# Patient Record
Sex: Male | Born: 1975
Health system: Southern US, Community
[De-identification: ages and names within clinical notes are randomized; demographics above are authoritative.]

## PROBLEM LIST (undated history)

## (undated) DIAGNOSIS — L8990 Pressure ulcer of unspecified site, unspecified stage: Secondary | ICD-10-CM

## (undated) DIAGNOSIS — Q8502 Neurofibromatosis, type 2: Secondary | ICD-10-CM

## (undated) DIAGNOSIS — G8250 Quadriplegia, unspecified: Secondary | ICD-10-CM

## (undated) DIAGNOSIS — R569 Unspecified convulsions: Secondary | ICD-10-CM

## (undated) DIAGNOSIS — D333 Benign neoplasm of cranial nerves: Secondary | ICD-10-CM

## (undated) DIAGNOSIS — H9190 Unspecified hearing loss, unspecified ear: Secondary | ICD-10-CM

## (undated) HISTORY — PX: LUMBAR FUSION: SHX111

## (undated) HISTORY — PX: OTHER SURGICAL HISTORY: SHX169

## (undated) HISTORY — PX: HAND EXPLORATION: SHX1725

## (undated) HISTORY — PX: NEPHRECTOMY: SHX65

## (undated) HISTORY — PX: SPINAL FUSION: SHX223

---

## 1998-05-15 ENCOUNTER — Ambulatory Visit (HOSPITAL_COMMUNITY): Admission: RE | Admit: 1998-05-15 | Discharge: 1998-05-15 | Payer: Self-pay

## 2002-02-04 ENCOUNTER — Ambulatory Visit (HOSPITAL_COMMUNITY): Admission: RE | Admit: 2002-02-04 | Discharge: 2002-02-04 | Payer: Self-pay | Admitting: Neurology

## 2002-02-04 ENCOUNTER — Encounter: Payer: Self-pay | Admitting: Neurology

## 2002-08-12 ENCOUNTER — Ambulatory Visit (HOSPITAL_BASED_OUTPATIENT_CLINIC_OR_DEPARTMENT_OTHER): Admission: RE | Admit: 2002-08-12 | Discharge: 2002-08-12 | Payer: Self-pay | Admitting: Orthopedic Surgery

## 2002-11-13 ENCOUNTER — Ambulatory Visit (HOSPITAL_COMMUNITY): Admission: RE | Admit: 2002-11-13 | Discharge: 2002-11-13 | Payer: Self-pay | Admitting: Neurology

## 2002-11-13 ENCOUNTER — Encounter: Payer: Self-pay | Admitting: Neurology

## 2003-04-11 ENCOUNTER — Encounter: Admission: RE | Admit: 2003-04-11 | Discharge: 2003-04-11 | Payer: Self-pay | Admitting: Family Medicine

## 2003-04-11 ENCOUNTER — Encounter: Payer: Self-pay | Admitting: Family Medicine

## 2004-01-05 ENCOUNTER — Ambulatory Visit (HOSPITAL_COMMUNITY): Admission: RE | Admit: 2004-01-05 | Discharge: 2004-01-05 | Payer: Self-pay | Admitting: Neurology

## 2004-10-04 ENCOUNTER — Ambulatory Visit (HOSPITAL_COMMUNITY): Admission: RE | Admit: 2004-10-04 | Discharge: 2004-10-04 | Payer: Self-pay | Admitting: Neurology

## 2005-12-13 ENCOUNTER — Ambulatory Visit (HOSPITAL_COMMUNITY): Admission: RE | Admit: 2005-12-13 | Discharge: 2005-12-13 | Payer: Self-pay | Admitting: Neurology

## 2005-12-26 ENCOUNTER — Ambulatory Visit (HOSPITAL_COMMUNITY): Admission: RE | Admit: 2005-12-26 | Discharge: 2005-12-26 | Payer: Self-pay | Admitting: Neurological Surgery

## 2006-01-02 ENCOUNTER — Ambulatory Visit (HOSPITAL_COMMUNITY): Admission: RE | Admit: 2006-01-02 | Discharge: 2006-01-02 | Payer: Self-pay | Admitting: Neurological Surgery

## 2006-05-13 ENCOUNTER — Encounter (INDEPENDENT_AMBULATORY_CARE_PROVIDER_SITE_OTHER): Payer: Self-pay | Admitting: Specialist

## 2006-05-13 ENCOUNTER — Inpatient Hospital Stay (HOSPITAL_COMMUNITY): Admission: RE | Admit: 2006-05-13 | Discharge: 2006-05-16 | Payer: Self-pay | Admitting: Neurological Surgery

## 2006-05-16 ENCOUNTER — Inpatient Hospital Stay (HOSPITAL_COMMUNITY)
Admission: RE | Admit: 2006-05-16 | Discharge: 2006-05-27 | Payer: Self-pay | Admitting: Physical Medicine & Rehabilitation

## 2006-05-16 ENCOUNTER — Ambulatory Visit: Payer: Self-pay | Admitting: Physical Medicine & Rehabilitation

## 2006-06-02 ENCOUNTER — Ambulatory Visit: Admission: RE | Admit: 2006-06-02 | Discharge: 2006-07-09 | Payer: Self-pay | Admitting: Radiation Oncology

## 2006-06-17 ENCOUNTER — Encounter
Admission: RE | Admit: 2006-06-17 | Discharge: 2006-09-15 | Payer: Self-pay | Admitting: Physical Medicine & Rehabilitation

## 2006-07-30 ENCOUNTER — Ambulatory Visit (HOSPITAL_COMMUNITY): Admission: RE | Admit: 2006-07-30 | Discharge: 2006-07-30 | Payer: Self-pay | Admitting: Neurological Surgery

## 2007-01-20 ENCOUNTER — Ambulatory Visit (HOSPITAL_COMMUNITY): Admission: RE | Admit: 2007-01-20 | Discharge: 2007-01-20 | Payer: Self-pay | Admitting: Neurological Surgery

## 2007-08-10 ENCOUNTER — Ambulatory Visit (HOSPITAL_COMMUNITY): Admission: RE | Admit: 2007-08-10 | Discharge: 2007-08-10 | Payer: Self-pay | Admitting: Neurological Surgery

## 2008-09-14 ENCOUNTER — Ambulatory Visit (HOSPITAL_COMMUNITY): Admission: RE | Admit: 2008-09-14 | Discharge: 2008-09-14 | Payer: Self-pay | Admitting: Neurological Surgery

## 2008-10-03 ENCOUNTER — Ambulatory Visit (HOSPITAL_COMMUNITY): Admission: RE | Admit: 2008-10-03 | Discharge: 2008-10-03 | Payer: Self-pay | Admitting: Neurological Surgery

## 2010-12-08 ENCOUNTER — Encounter: Payer: Self-pay | Admitting: Neurology

## 2010-12-09 ENCOUNTER — Encounter: Payer: Self-pay | Admitting: Hematology and Oncology

## 2010-12-09 ENCOUNTER — Encounter: Payer: Self-pay | Admitting: Neurological Surgery

## 2011-04-05 NOTE — Op Note (Signed)
Cameron Hale, Cameron Hale NO.:  [id number]   MEDICAL RECORD NO.:  [id number]          PATIENT TYPE:  IPS   LOCATION:  4009                         FACILITY:  [facility]   PHYSICIAN:  Stefani Dama, M.D.  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   PROCEDURE:  T4-T8 laminectomy, inclusive, myelotomies with syringotomy and  partial resection of spinal cord tumor T4 and T7, use of operative  microscope and microdissection technique.  Intraoperative ultrasonography.   SURGEON:  Stefani Dama, M.D.   FIRST ASSISTANT:  Cristi Loron, M.D.   ANESTHESIA:  General endotracheal.   INDICATIONS:  Cameron Hale is a 35 year old individual who has had  significant back and lower extremity pain and weakness.  He has been  followed by me for at least a period of a year and has been followed by a  neurologist for a period longer than that.  He was found to have multiple  tumors in the spinal cord, mainly behind the cervical body of C2, C6, T4,  T7, and T10.  He has been advised regarding the need for surgical resection  as there has been a noted deterioration in his level of function, and there  is enlargement of the tumor with an enlarging syrinx, particularly at the  level of T4 but also at the level of C7.  His largest tumor is at the T7  level, and it has been decided to proceed with resection, possibly biopsy  and syringotomy at the two levels involved.   DESCRIPTION OF PROCEDURE:  The patient was brought to the operating room  supine on the stretcher after the smooth induction of general endotracheal  anesthesia.  He was turned prone.  The back was prepped with DuraPrep and  draped in  a sterile fashion.  The bony prominences were appropriately padded  and protected. Localizing radiograph identified the external markers at the  level of T4, T8 and T10.  With these external landmarks, a midline incision  was created from T4 to T8 and the dissection was carried down through the  thoracodorsal fascia.  The subperiosteal dissection of the laminar arches  from T4 to T8 was then undertaken.  These were packed off laterally and then  laminectomy was performed removing the spinous processes and laminar arches  of T4-T8, inclusive.  The bases of the lamina were then drilled down with  high-speed bur and a 5 mm dissecting bit.  Dissection was then taken out  laterally to the lateral aspect of the common dural tube, the medial aspects  of the facet joints.  Hemostasis in the lateral gutters was obtained with  some small pledgets of Gelfoam soaked in thrombin, which were packed off  into the lateral areas to maintain hemostasis.  Once the hemostasis was  secure, and the  opening was felt to be adequate, the dissection was  continued.  The microscope was draped and brought into the field.  A dural  opening was then performed using a single tack up of 6-0 Prolene, and a 15  blade was used to open the dura.  The Lorette Ang was then inserted between the  dura and the extra arachnoid space, and this was opened as a singular layer.  The arachnoid was then opened and dissected free and clamped to the lateral  aspect of the dural opening using small Hemoclips.  The spinal fluid was  released, and there was noted to be a significant mass at the level of T8,  expanding the cord.  There was also noted to be a secondary mass at the  level of T4.  Then using ultrasonography, the solid portions of the tumor  were identified, and the borders between the tumor and the syrinx were  identified at each of the T4 and the T7 levels.  Then, using the operating  microscope and microdissection technique, the pia  above the center of the  cord was cauterized over the midline, and an area was chosen at the junction  of the syrinx and the solid tumor to create the myelotomy.  The myelotomy  released a thick yellowish fluid at each level and then the myelotomy was  then increased in size so as to allow exploration of the mass within the  cord at the syrinx.  The tumor was noted to be a tannish-gray discolored  lesion. that did not appear to be particularly hemorrhagic at the T7 level.  Biopsies of this lesion were then obtained using a micro biopsy forceps and  releasing some small pieces of tissue from within the tumor.  These biopsies  were sent for frozen section.  Further exploration identified a faintly  distinct border between the surrounding spinal cord and the tumor itself.  This was gradually developed using microdissection technique by very  carefully releasing the tumor from the border with the spinal cord itself.  A membrane was identified in the region of the syrinx, and this membrane was  also taken up and sent for biopsy.  As the dissection proceeded, a frozen  section diagnosis of the T7 lesion was returned as low grade glioma  consistent with astrocytoma.  The procedure then was continued exploring the  region of the T4 tumor.  Similar myelotomy was created here and again a  thick yellowish fluid was encountered in the syrinx, and this was released.  The tumor here was noted to have a somewhat more fleshy appearance and was  also tan-gray in color, somewhat more gelatinous and on biopsy of this tumor  it was noted to be somewhat more hemorrhagic.  Biopsies were sent separately  from the T4 lesion and after a while the frozen section diagnosis from this  lesion suggested a higher grade glioma, possibly a grade 3 glioma from the  T4 lesion.  In this area also it was noted that the border between the  surrounding tumor and the spinal cord was less distinct.  Several biopsies and a partial  resection of the T4 lesion was then attempted, and this was  performed to the best of the ability to remove as much tumor from within the  center of the tumor mass as could be safely removed without disrupting the  surrounding malleolar tissue at the border with the spinal cord itself.  Hemostasis was obtained with some small pledgets of  Gelfoam soaked in  thrombin to later remove and cautious use of bipolar cautery was used in  this area.  During the procedure, the cool wave ate bipolar tips were used  so as to minimize any surrounding tissue damage.  The procedure was then  continued back at the T7 lesion, and more tumor was resected from this  region dissecting the plane between the spinal cord and the tumor as best  possible.  As this continued, the gross bulk of the tumor was removed until  the syrinx below the tumor mass itself was identified, and this was opened  into the same syringotomy cavity.  Once this was accomplished and hemostasis  was achieved within the cavity, the T4 lesion was again explored and it was  not felt that further resection could be performed safely in this region.  The syringotomy was noted to be quite adequate in this region.  Hemostasis  was achieved within the cavity of the spinal cord itself.  At this point, it  was decided to close the dura was 6-0 Prolene sutures being used, closing  from the midline to the cephalad and midline to the inferior aspect of the  dura.  Once this was accomplished, the microscope was removed from the  surgical field.  The surrounding soft tissues were checked for hemostasis.  The wound was irrigated copiously with antibiotic irrigating solution and  then the thoracodorsal fascia was closed first by placing #1 Vicryl sutures  in the dorsal fascia and closing the individual muscle layers above this  with number 1 and 2-0 Vicryl sutures, and 3-0 Vicryl was used in the  subcutaneous tissues.  Finally, surgical staples were used in  the skin.  Blood loss for the procedure was estimated about 250 mL.  The  patient  appeared to tolerate the procedure well im the operating room.  He was then  returned turned to the recovery room in stable condition.      Stefani Dama, M.D.  Electronically Signed     HJE/MEDQ  D:  05/17/2006  T:  05/17/2006  Job:  [id number]

## 2011-04-05 NOTE — Discharge Summary (Signed)
NAMEOLUMIDE, DOLINGER NO.:  [id number]   MEDICAL RECORD NO.:  [id number]          PATIENT TYPE:  IPS   LOCATION:  4009                         FACILITY:  [facility]   PHYSICIAN:  Ellwood Dense, M.D.   DATE OF BIRTH:  26-Sep-1976   DATE OF ADMISSION:  05/16/2006  DATE OF DISCHARGE:  05/27/2006                                 DISCHARGE SUMMARY   DISCHARGE DIAGNOSES:  1.  Thoracic T4 syrinx astrocytoma.  2.  History of neurofibromatosis, type 1.  3.  Pain management.  4.  Urinary retention, resolved.   PROCEDURES:  Thoracic T4-8 laminectomy with paraparesis, May 13, 2006.   This is a 35 year old white male, history of neurofibromatosis, type 1, for  approximately 15 years, admitted May 13, 2006, with lower extremity  weakness.  MRI of spine with lesion, thoracic T4, with a large associated  syrinx and 2nd lesion at thoracic T7-T8.  Underwent thoracic T4-T8  laminectomy, myelotomy with syringovectomy and partial resection of spinal  cord tumor, May 13, 2006, per Dr. Barnett Abu.  Placed on Decadron  protocol.  Foley catheter tube remained May 15, 2006.  Pathology report  with low-grade astrocytoma with plan for Radiology Services followup.  The  patient was admitted for a comprehensive rehab program.   PAST MEDICAL HISTORY:  See discharge diagnoses.  Occasional alcohol.  No  tobacco.   ALLERGIES:  None.   SOCIAL HISTORY:  Lives with his wife.  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  They live in [place].  He has good family assistance.  They  live in a 1-level home with a ramp.   MEDICATIONS PRIOR TO ADMISSION:  None.   REHABILITATION HOSPITAL COURSE:  The patient was admitted to Inpatient Rehab  Services with therapies initiated on a b.i.d. basis consisting of physical  therapy, occupational therapy, and rehabilitation nursing.  The following  issues were addressed during patient's rehabilitation stay.  Pertaining to  Mr. Peel' thoracic T4-8  laminectomy paraparesis pathology report of low-  grade astrocytoma, follow up per Radiology Services, Dr. Dayton Scrape, who would  again see patient as an outpatient May 23, 2006, to discuss plan of care.  He would remain on Decadron therapy with taper as per dictation of Dr. Barnett Abu of Neurosurgery.  Pain management ongoing with the use of oxycodone  and good results.  Overall, he was minimal assist for his functional  mobility, supervision for upper body activities of daily living, needing  minimal assist for squat pivot transfers.  Therapies would be ongoing as per  [employer].  He had some initial urinary retention.  This did resolve.  He had been on Flomax for a short time.  This was discontinued.  Throughout  his rehab course, his mood remained upbeat and positive.  He had excellent  family support.  He was discharged to home May 27, 2006, in stable  condition.   Latest labs showed a sodium 137, potassium 4.4, BUN 17, creatinine 0.7.  Hemoglobin 13.9, hematocrit 42.7, platelets 279,000.   DISCHARGE MEDICATIONS AT TIME OF DICTATION:  1.  Decadron with taper as per Neurosurgery.  2.  Pepcid 20 mg twice daily.  3.  Oxycodone immediate release every 4 hours as needed, pain.   DIET:  Regular.   He would follow up with Dr. Lupe Carney, Medical Management, Dr. Barnett Abu, Neurosurgery, Dr. Dayton Scrape, Radiology Services, June 02, 2006, to  dictate plan of care for low-grade astrocytoma, Dr. Ellwood Dense, Rehab  Services, as advised.      Mariam Dollar, P.A.    ______________________________  Ellwood Dense, M.D.    DA/MEDQ  D:  05/26/2006  T:  05/26/2006  Job:  [id number]   cc:   Stefani Dama, M.D.  Fax: [phone]   L. Lupe Carney, M.D.  Fax: [phone]   Maryln Gottron, M.D.  Fax: [phone]

## 2011-04-05 NOTE — Op Note (Signed)
   NAME:  Cameron Hale, Cameron Hale                     ACCOUNT NO.:  [id number]   MEDICAL RECORD NO.:  [id number]                   PATIENT TYPE:  AMB   LOCATION:  DSC                                  FACILITY:  [facility]   PHYSICIAN:  Loreta Ave, M.D.              DATE OF BIRTH:  1976-10-04   DATE OF PROCEDURE:  08/12/2002  DATE OF DISCHARGE:                                 OPERATIVE REPORT   PREOPERATIVE DIAGNOSIS:  Chondromalacia of patella, right knee, with  underlying neurofibromatosis.   POSTOPERATIVE DIAGNOSES:  1. Chondromalacia of patella, right knee, with underlying neurofibromatosis.  2. Large and plain partially torn medial plica.   PROCEDURES:  1. Right knee examination under anesthesia.  2. Arthroscopy with chondroplasty.  3. Excision of medial plica.   SURGEON:  Loreta Ave, M.D.   ANESTHESIA:  Oris Drone. Petrarca, P.A.-C.   ANESTHESIA:  General.   BLOOD LOSS:  Minimal.   TOURNIQUET:  Not employed.   SPECIMENS:  None.   CULTURES:  None.   COMPLICATIONS:  None.   DRESSINGS:  Soft compressive.   PROCEDURE:  The patient was brought to the operating room and, after  adequate anesthesia had been obtained, the right knee was examined.  Full  passive motion.  Marked quadriceps atrophy.  Good stability.  Lateral  patellofemoral tracking but without tethering.  Tourniquet and leg holder  applied.  Leg prepped and draped in the usual sterile fashion.  Three  portals created, one superolateral, one each medial and lateral  peripatellar.  Inflow catheter introduced.  The knee was extended.  Arthroscope introduced.  The knee was inspected.  Some grade 1 and 2 changes  on the patella which was debrided.  For the most part, articular cartilage  looked reasonably good.  Very flat patella with lateral tracking but did not  tether enough to warrant release.  Cruciate ligaments, medial and lateral  compartment, medial and lateral meniscus all intact.  Large fibrotic  medial  plica extending a third of the way into the patellofemoral joint.  Plica  excised in its entirety.  The tracking thoroughly assessed at the  patellofemoral joint  from all angles.  Instruments and fluid removed.  Portals and knee injected  with Marcaine.  Portals closed with 4-0 nylon.  Sterile compressive dressing  applied.  Anesthesia reversed.  Brought to the recovery room.  Tolerated  surgery well.  No complications.                                               Loreta Ave, M.D.    DFM/MEDQ  D:  08/12/2002  T:  08/13/2002  Job:  [id number]

## 2011-04-05 NOTE — Op Note (Signed)
NAMEKESLEY, GAFFEY                ACCOUNT NO.:  [id number]   MEDICAL RECORD NO.:  [id number]          PATIENT TYPE:  INP   LOCATION:  3101                         FACILITY:  [facility]   PHYSICIAN:  Stefani Dama, M.D.  DATE OF BIRTH:  12-Feb-1976   DATE OF PROCEDURE:  05/15/2006  DATE OF DISCHARGE:                                 OPERATIVE REPORT   PREOPERATIVE DIAGNOSIS:  Neurofibromatosis with spinal cord tumors and  syringomyelia T4 and T7.   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   PROCEDURE:  T4-T8 laminectomy, myelotomies with syringostomy and partial  resection of spinal cord tumors T4 and T7, use of operative microscope and  microdissection technique, intraoperative ultrasonography.   SURGEON:  Dr. Danielle Dess.   FIRST ASSISTANT:  Dr. Delma Officer.   ANESTHESIA:  General endotracheal.   INDICATIONS:  Mykah Shin Beltre is a 35 year old individual who has had  significant back and lower extremity weakness.  He has been followed for a  period of a year and a half and has multiple tumors in his spinal cord,  mainly at C2, C6, T4, T7 and T10.  He has been advised regarding the need  for surgical resection as there has been noted to be deterioration in his  level function and also enlargement of the tumor with an enlarging syrinx at  the level of T4 particularly.  His largest tumor, however, is at the T7  level and it has been decided to proceed with resection, biopsy and  syringostomy at these two levels.   PROCEDURE:  The patient was brought to the operating room supine on a  stretcher.  After smooth induction of general endotracheal anesthesia, he  was turned prone.  The back was prepped with DuraPrep and draped in a  sterile fashion.  Localizing radiographs identified external markers at the  levels of T4, T8 and T10.  With these external landmarks, a midline incision  was created from T4 to T8 and dissection was  carried down to through the  thoracodorsal fascia.  Subperiosteal dissection of the laminar arches from  T4 to T8 was then undertaken.  These were packed off laterally, and then a  laminectomy was performed removing the spinous processes and laminar arches  of T4 through T8 inclusive.  The bases of the lamina were then drilled down  with a high-speed bur and a 5-mm dissecting bit.  Dissection was taken out  laterally to the lateral aspects of the dura and near the medial aspect of  the facet joint.  Hemostasis in the lateral gutters was obtained with some  small pledgets of Gelfoam soaked in thrombin, which were packed into this  area to maintain hemostasis.  Once this was secured, dissection was  continued.  The microscope was draped and brought into the field.  The dural  opening was performed using a single tack-up of 6-0 Prolene and a 15 blade  to open up dura.  Woodson dissector was then insinuated between the dura and  the extra-arachnoid space, and this was opened as a  singular layer.  Arachnoid was then opened and dissected free.  Spinal fluid was released.  There was noted to be a significant mass at the level of T8 with the tumor  in a syrinx.  There was also secondary mass at T4.  Then using  ultrasonography, the solid portions of the tumor were identified.  External  marks were placed in the area of the solid tumor to distinguish this from  the area of the syrinx.  This was done both at the T4 and the T8 levels.  Then with the use of the operating microscope, a myelotomy was created by  cauterizing the pia over the midline at the chosen areas just above and  below each of the respective  tumors.  First it was done at the T7 level.  The myelotomy was created and  immediately thick yellowish fluid was encountered.  The walls of the  syringostomy cavity were then explored.   Dictation ended at this point.      Stefani Dama, M.D.  Electronically Signed     HJE/MEDQ  D:   05/15/2006  T:  05/15/2006  Job:  [id number]

## 2011-04-05 NOTE — Op Note (Signed)
NAMEJOHNATHIN, VANDERSCHAAF                ACCOUNT NO.:  [id number]   MEDICAL RECORD NO.:  [id number]          PATIENT TYPE:  INP   LOCATION:  3101                         FACILITY:  [facility]   PHYSICIAN:  Stefani Dama, M.D.  DATE OF BIRTH:  [birth year]   DATE OF PROCEDURE:  05/13/2006  DATE OF DISCHARGE:                                 OPERATIVE REPORT   PREOPERATIVE DIAGNOSIS:  Neurofibromatosis with spinal cord tumors and  syringomyelia.   POSTOPERATIVE DIAGNOSIS:  Neurofibromatosis with spinal cord tumors and  syringomyelia.   PROCEDURE:  T4 through T8 laminectomy, myelotomy with syring ostomy, and  partial resection of spinal cord tumors with operating microscope and  microdissection technique.   SURGEON:  Stefani Dama, M.D.   FIRST ASSISTANT:  Cristi Loron, M.D.   ANESTHESIA:  General endotracheal.   INDICATIONS:  Lakota Markgraf is a 35 year old individual who has had  significant problems with progressive weakness in his lower extremities.  He  has been followed for about a year and half with presence of spinal cord  tumors noted at multiple levels including C2, C6, T4, T7 and also at T10.  Because of his progressive degeneration, a recent MRI demonstrated that  there is been marked enlargement of the syrinx and tumor at this T7 level.  There is also some modest enlargement of the syrinx at the T4 level.   Dictation ended at this point.      Stefani Dama, M.D.  Electronically Signed     HJE/MEDQ  D:  05/15/2006  T:  05/15/2006  Job:  [id number]

## 2011-04-05 NOTE — H&P (Signed)
NAME:  Cameron, Hale NO.:  [id number]   MEDICAL RECORD NO.:  [id number]          PATIENT TYPE:  IPS   LOCATION:  4009                         FACILITY:  [facility]   PHYSICIAN:  Ellwood Dense, M.D.   DATE OF BIRTH:  June 05, 1976   DATE OF ADMISSION:  05/16/2006  DATE OF DISCHARGE:                                HISTORY & PHYSICAL   HISTORY OF PRESENT ILLNESS:  Cameron Hale is a 35 year old adult male with a  history of neurofibromatosis, type 1, diagnosed approximately 15 years ago  during high  school.  He has had no substantial specific injuries related to  the neurofibromatosis although he does have a history of left eye blindness  which he assumes is secondary to that disorder.  He also has had a left arm  tendon replacement, but those are his only treatments secondary to that  above diagnosis.  The patient was admitted May 13, 2006, with lower  extremity weakness.  An MRI scan of his brain showed a lesion at T4 with a  large associated syrinx and secondary lesions at T7 and T8.  The patient  underwent T4-T8 laminectomy and myelotomy along with syringectomy and  partial resection of a spinal cord tumor May 13, 2006, performed by [employer].  Interoperative pathology was sent.  The patient was placed on a Decadron  protocol and that was decreased to 4 mg q.6h. May 15, 2006, and now 4 mg  q.8h. as of today for three more days and then decrease to 4 mg b.i.d.  His  PCA pump was discontinued May 14, 2006.  A Foley catheter was removed May 15, 2006, and post voiding residuals are being checked.   Dr. Danielle Dess subsequently reported that a path report came back positive for  astrocytoma and Dr. Danielle Dess plans to consult radiation therapy.  The patient  was evaluated by the rehabilitation physicians and felt to be an appropriate  candidate for inpatient rehabilitation.   REVIEW OF SYSTEMS:  Positive for weakness and numbness of the bilateral legs  below left level.   PAST  MEDICAL HISTORY:  1.  Type 1 neurofibromatosis diagnosed approximately 15 years ago.  2.  History of right knee arthroscopy.  3.  Left arm tendon replacement.  4.  Blindness of the left eye questionably secondary to neurofibromatosis.   FAMILY HISTORY:  Noncontributory.   SOCIAL HISTORY:  The patient lives with his wife in [place].  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  His wife managed the house and  can assist as needed.  They have no children.  They live in a one level home  with a ramp to enter.  The patient does not use tobacco and reports  occasional beer use.   FUNCTIONAL HISTORY:  Prior to admission independent with rare use of a cane.   ALLERGIES:  No known drug allergies.   MEDICATIONS PRIOR TO ADMISSION:  Tylenol.   RECENT LABORATORY DATA:  Hemoglobin 15.2, hematocrit 45.4, platelet count  229,000, and white count 7.3.  Recent sodium was 138, potassium 4.1,  chloride 106,  CO2 25, BUN 10, creatinine 0.8.   PHYSICAL EXAMINATION:  GENERAL:  Well appearing fit adult male lying in bed in mild to no acute  discomfort.  Blood pressure 113/72 with a pulse of 73, respiratory rate 18,  and temperature 97.8.  HEENT:  Normocephalic, atraumatic.  CARDIOVASCULAR:  Regular rate and rhythm, S1 and S2 without murmurs.  ABDOMEN:  Soft, nontender, with positive bowel sounds.  LUNGS:  Clear to auscultation bilaterally.  NEUROLOGICAL:  Alert and oriented x3.  Cranial nerves 2-12 showed blindness  of the left eye.  He had normal facial strength.  SKIN:  Examination of the skin showed one cafe au lait spot on his left  flank.  EXTREMITIES:  Bilateral upper extremity exam showed 5/5 strength throughout.  Bulk and tone were normal.  Reflexes were 2+ and symmetrical.  Sensation was  intact to light touch of bilateral upper extremities.  Right lower extremity  exam showed 3-/5 strength in hip flexion and knee extension with decreased  sensation throughout the entire  right lower extremity compared to his upper  extremities.  Examination of the left lower extremity showed 4+/5 strength  with, again, decreased sensation complaining of numbness in the left lower  extremity.  The patient's surgical wound showed staples in place with no  significant drainage in his upper thoracic region.   IMPRESSION:  1.  Status post T4-T8 laminectomy with resection of spinal cord tumor and      subsequent pathology showing astrocytoma.  2.  History of neurofibromatosis type 1 with blindness of the left eye.   Presently, the patient has deficits in ADLs, transfers, and ambulation  secondary to the above noted spinal cord tumor with resection and possible  ongoing radiation therapy.   PLAN:  1.  Admit to the rehabilitation unit for daily physical therapy and      occupational therapy to advance ADLs, transfers, and ambulation.  2.  Check admission labs including CBC and CMP Monday, May 19, 2006.  3.  Continue Decadron 4 mg q.8h. x3 days then 4 mg p.o. b.i.d.  4.  DC staples from back May 19, 2006, and place Steri-Strips.  5.  Continue checking post void residuals with in and out catheterizations      if volumes greater than 300 mL.  6.  Continue Pepcid 20 mg b.i.d. for GI prophylaxis while on Decadron.  7.  Dressing changes to the upper thoracic spine daily and paint incision      with Betadine.  8.  Laxative p.r.n.  9.  Oxycodone 5 mg 1-2 tablets p.o. q.4h. p.r.n. pain.  10. Regular diet.  11. 24 hour nursing for medication administration and monitoring of vital      signs along with wound dressing changes.   PROGNOSIS:  Good.   ESTIMATED LENGTH OF STAY:  8-15 days.   GOALS:  Modified independent at ADLs, transfers, and short distance  ambulation either at modified independent level or standby assist level  using a device.           ______________________________  Ellwood Dense, M.D.    DC/MEDQ  D:  05/16/2006  T:  05/16/2006  Job:  [id number]   cc:    Stefani Dama, M.D.  Fax: [phone]   L. Lupe Carney, M.D.  Fax: [phone]

## 2011-04-05 NOTE — H&P (Signed)
NAMESHALEV, HELMINIAK                ACCOUNT NO.:  [id number]   MEDICAL RECORD NO.:  [id number]          PATIENT TYPE:  INP   LOCATION:  3040                         FACILITY:  [facility]   PHYSICIAN:  Stefani Dama, M.D.  DATE OF BIRTH:  03/10/1976   DATE OF ADMISSION:  05/13/2006  DATE OF DISCHARGE:  05/16/2006                                HISTORY & PHYSICAL   ADMITTING DIAGNOSES:  1.  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  2.  Paraparesis of lower extremities.   DISCHARGE AND FINAL DIAGNOSES:  1.  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  2.  Paraparesis of lower extremities.  3.  Postoperative urinary retention.   MAJOR OPERATION:  T4-T8 laminectomy with myelotomies and syringotomy and  partial resection of spinal cord tumors T4 and T7, use of operative  microscope microdissection technique on May 13, 2006.   CONSULTATION:  Rehabilitation medicine and radiation oncology.   HOSPITAL COURSE:  Cameron Hale is a 35 year old individual who has had  difficulty with progressive weakness of his lower extremities.  He has been  followed for about a year's period of time and was found to have enlarging  tumors at the level of T4 and T7.  He also had syringomyelia.  He has other  lesions in his spinal cord noted also at C2, C7 and T10.  He was admitted to  the hospital at this time and underwent surgical resection of the largest  lesions at T4 and T7.  The frozen section diagnosis of the T4 lesion  revealed a high-grade glioma.  It appears that he has a grade 2 astrocytoma  at the T7 lesion.  He has had significant deficits preoperatively.  His  deficits have been fairly stable save for some increased weakness in that  right quadriceps and some increased numbness in his buttocks and lower  extremities.  He is now being transferred to the rehabilitation center to  undergo further inpatient rehabilitation.  Meanwhile, his tumor biopsies  have been sent to [facility] for confirmatory diagnosis by Dr.  Amparo Bristol.  He will be seen in consultation by the radiation oncology  group at [facility] and further plans for treatment will be made after he is  allowed an adequate period of time to heal from the surgical intervention.   CONDITION ON DISCHARGE:  Stable.   The possibility that he may have some further urinary retention symptoms  will be addressed by urology consult if necessary while the patient is on  the rehabilitation ward.      Stefani Dama, M.D.  Electronically Signed     HJE/MEDQ  D:  05/17/2006  T:  05/17/2006  Job:  [id number]

## 2011-09-23 DIAGNOSIS — Q8502 Neurofibromatosis, type 2: Secondary | ICD-10-CM | POA: Insufficient documentation

## 2011-09-23 DIAGNOSIS — Q8500 Neurofibromatosis, unspecified: Secondary | ICD-10-CM | POA: Insufficient documentation

## 2012-06-16 ENCOUNTER — Ambulatory Visit
Admission: RE | Admit: 2012-06-16 | Discharge: 2012-06-16 | Disposition: A | Discharge status: Home / Self Care | Payer: Medicare Other | Source: Ambulatory Visit | Attending: Family Medicine | Admitting: Family Medicine

## 2012-06-16 ENCOUNTER — Other Ambulatory Visit: Payer: Self-pay | Admitting: Family Medicine

## 2012-06-16 DIAGNOSIS — W19XXXA Unspecified fall, initial encounter: Secondary | ICD-10-CM

## 2012-07-03 DIAGNOSIS — N2889 Other specified disorders of kidney and ureter: Secondary | ICD-10-CM | POA: Insufficient documentation

## 2014-02-17 DIAGNOSIS — G8220 Paraplegia, unspecified: Secondary | ICD-10-CM | POA: Insufficient documentation

## 2014-02-17 DIAGNOSIS — Z981 Arthrodesis status: Secondary | ICD-10-CM | POA: Insufficient documentation

## 2015-03-14 DIAGNOSIS — C720 Malignant neoplasm of spinal cord: Secondary | ICD-10-CM | POA: Insufficient documentation

## 2015-03-14 DIAGNOSIS — D333 Benign neoplasm of cranial nerves: Secondary | ICD-10-CM | POA: Insufficient documentation

## 2015-03-14 DIAGNOSIS — D420 Neoplasm of uncertain behavior of cerebral meninges: Secondary | ICD-10-CM | POA: Insufficient documentation

## 2016-05-01 ENCOUNTER — Ambulatory Visit (INDEPENDENT_AMBULATORY_CARE_PROVIDER_SITE_OTHER): Payer: Medicare Other | Admitting: Podiatry

## 2016-05-01 ENCOUNTER — Encounter: Payer: Self-pay | Admitting: Podiatry

## 2016-05-01 VITALS — BP 109/60 | HR 62

## 2016-05-01 DIAGNOSIS — R262 Difficulty in walking, not elsewhere classified: Secondary | ICD-10-CM

## 2016-05-01 DIAGNOSIS — M79606 Pain in leg, unspecified: Secondary | ICD-10-CM

## 2016-05-01 DIAGNOSIS — G709 Myoneural disorder, unspecified: Secondary | ICD-10-CM | POA: Insufficient documentation

## 2016-05-01 DIAGNOSIS — M79673 Pain in unspecified foot: Secondary | ICD-10-CM | POA: Diagnosis not present

## 2016-05-01 DIAGNOSIS — L570 Actinic keratosis: Secondary | ICD-10-CM | POA: Diagnosis not present

## 2016-05-01 NOTE — Patient Instructions (Signed)
Painful callus debrided and pad applied to AFO both side.

## 2016-05-01 NOTE — Progress Notes (Signed)
Subjective: 40 year old male presents complaining of painful calluses.  Been here a few years back. Was treated for calluses and padded brace where it rubs foot and builds callus. Callus from braces are hurting. Ambulation aided by AFO on both lower limbs with walker.   History: Existing condition includes weakness on both legs with paraparesis, ependymoma of spinal cord, Neuromuscular disorder(Neuromyopathy), Neurofibromatosis, Left Renal Mass, Atypical intracranial meningioma, H/O spinal fusion.   Objective: Positive of keratotic tissue build up at the base of the 5th Metatarsal base where it rubs against the AFO. Under developed lower limbs bilateral.  Assessment: Bilateral foot callus due to friction caused by brace at the base of 5th Metatarsal bone.   Plan: Reviewed findings. All debrided. 1/4" Felt pad placed on brace to prevent friction.

## 2017-07-24 ENCOUNTER — Ambulatory Visit (INDEPENDENT_AMBULATORY_CARE_PROVIDER_SITE_OTHER): Payer: Medicare Other | Admitting: Podiatry

## 2017-07-24 ENCOUNTER — Encounter: Payer: Self-pay | Admitting: Podiatry

## 2017-07-24 DIAGNOSIS — G8220 Paraplegia, unspecified: Secondary | ICD-10-CM

## 2017-07-24 DIAGNOSIS — L97521 Non-pressure chronic ulcer of other part of left foot limited to breakdown of skin: Secondary | ICD-10-CM | POA: Diagnosis not present

## 2017-07-24 DIAGNOSIS — R262 Difficulty in walking, not elsewhere classified: Secondary | ICD-10-CM | POA: Diagnosis not present

## 2017-07-24 NOTE — Patient Instructions (Addendum)
Seen for ulcerated left foot. Debrided and padded. Follow home care instruction with supply dispensed. Return in 2 weeks.

## 2017-07-24 NOTE — Progress Notes (Signed)
Subjective: 41 year old male presents complaining of painful lesion on left foot that developed a [place], 07[place]20/17.  It swells and hurts. Stated that his feet are getting weaker.  He was seen in 05/01/16 for painful calluses. Ambulation aided by AFO on both lower limbs with walker.   History: Existing condition includes weakness on both legs with paraparesis, ependymoma of spinal cord, Neuromuscular disorder(Neuromyopathy), Neurofibromatosis, Left Renal Mass, Atypical intracranial meningioma, H/O spinal fusion.   Objective: Open ulcer with broken soft callus under the base of the 5th Metatarsal plantar left foot.  Atrophied lower limbs.  Assessment: Ulcerated callus plantar left foot at the base of 5th Metatarsal bone with subdermal layer exposure. Minimum cellulitis. Palpable pedal pulses. No associated edema noted. Atrophied lower limbs bilateral.  Plan: Reviewed findings.  Ulcer debrided. 1/4" Felt pad placed with antibiotic dressing. Home care instruction given with supply. Return in 2 weeks.

## 2017-08-07 ENCOUNTER — Ambulatory Visit (INDEPENDENT_AMBULATORY_CARE_PROVIDER_SITE_OTHER): Payer: Medicare Other | Admitting: Podiatry

## 2017-08-07 ENCOUNTER — Encounter: Payer: Self-pay | Admitting: Podiatry

## 2017-08-07 DIAGNOSIS — M21372 Foot drop, left foot: Secondary | ICD-10-CM

## 2017-08-07 DIAGNOSIS — L97521 Non-pressure chronic ulcer of other part of left foot limited to breakdown of skin: Secondary | ICD-10-CM

## 2017-08-07 DIAGNOSIS — R262 Difficulty in walking, not elsewhere classified: Secondary | ICD-10-CM

## 2017-08-07 DIAGNOSIS — M21371 Foot drop, right foot: Secondary | ICD-10-CM

## 2017-08-07 NOTE — Patient Instructions (Signed)
Ulcerated left foot noted of slow healing.  Need a new brace.  Wound debrided and padded. Return in 2 weeks.

## 2017-08-07 NOTE — Progress Notes (Signed)
Subjective: 41 year old male presents for follow up on left foot ulcer.  He has developed left foot ulcer from the existing brace that is rubbing hard on midfoot area on 07/20/17.. Stated that he has followed instruction and changed dressing last week. He is not able to walk without brace due to foot drop and weak lower limb muscle strength. Existing AFO has steel frame.  Ambulation aided by AFO on both lower limbs with walker.   History: Existing condition includes weakness on both legs with paraparesis, ependymoma of spinal cord, Neuromuscular disorder(Neuromyopathy), Neurofibromatosis, Left Renal Mass, Atypical intracranial meningioma, H/O spinal fusion.   Objective: Open ulcer with broken soft callus under the base of the 5th Metatarsal plantar left foot.  Varus rotating foot with plantar midlateral skin ulcer which is being rubbed by steel frame AFO on left foot. Palpable pedal pulses but compromised neurovascular status of both lower limbs. No muscle strength anterior muscle group. Atrophied lower limbs.  Assessment: Foot drop with difficulty walking. Ulcerated callus plantar left foot at the base of 5th Metatarsal bone with subdermal layer exposure. This is caused by varus rotated foot rubbing 5th Metatarsal base against existing steel frame AFO.  Atrophied lower limbs bilateral.  Plan: Reviewed findings.  Ulcer debrided. 1/4" Felt pad placed with antibiotic dressing. In need of new brace to heal the left foot ulcer. Home care instruction given with supply. Return in 2 weeks.

## 2017-08-21 ENCOUNTER — Ambulatory Visit (INDEPENDENT_AMBULATORY_CARE_PROVIDER_SITE_OTHER): Payer: Medicare Other | Admitting: Podiatry

## 2017-08-21 ENCOUNTER — Encounter: Payer: Self-pay | Admitting: Podiatry

## 2017-08-21 DIAGNOSIS — G8220 Paraplegia, unspecified: Secondary | ICD-10-CM | POA: Diagnosis not present

## 2017-08-21 DIAGNOSIS — L97521 Non-pressure chronic ulcer of other part of left foot limited to breakdown of skin: Secondary | ICD-10-CM | POA: Diagnosis not present

## 2017-08-21 DIAGNOSIS — M21371 Foot drop, right foot: Secondary | ICD-10-CM

## 2017-08-21 DIAGNOSIS — R262 Difficulty in walking, not elsewhere classified: Secondary | ICD-10-CM | POA: Diagnosis not present

## 2017-08-21 DIAGNOSIS — M21372 Foot drop, left foot: Secondary | ICD-10-CM | POA: Diagnosis not present

## 2017-08-21 NOTE — Progress Notes (Signed)
Subjective: 41 y.o. year old male patient presents using walker and braced lower limb accompanied by his parents for follow up on left foot ulcer.  He is to be casted for Ritch brace. He is on AFO on both lower limbs.  HPI: Developed ulcer on left foot mid plantar base of the 5th metatarsal which was discovered on 07/20/17. Been treated with debridement and off loading with q 2 wk office visit.  History: Existing condition includes weakness on both legs with paraparesis, ependymoma of spinal cord, Neuromuscular disorder(Neuromyopathy), Neurofibromatosis, Left Renal Mass, Atypical intracranial meningioma, H/O spinal fusion.   Objective: Dermatologic:  Open ulcer plantar a the base of the 5th metatarsal bone caused by ill fitting steel brace that is rubbing the bone. Wound is improving and more shallow surface. Vascular: Pedal pulses are faintly palpable. Orthopedic: Contracted lesser digits Significant cavovarus deformity with laterla weight shifting bilateral. Loss of anterior muscle group with minimum strength.  Atrophied lower limb muscle mass bilateral. Foot drop during ambulation.  Assessment: Foot drop with difficulty walking. Paraparesis. Cavo varus foot with ulcer at the base of 5th Metatarsal left. Opening is about 0.5 cm limited to breakdown of skin.  Treatment: Ulcer debrided and padded. Left AFO adjusted with more padding. Left lower limb impression taken to order a new AFO. Home care instruction given with supplies. Advised to be off loading as much as possible.

## 2017-08-21 NOTE — Patient Instructions (Signed)
Follow up on left foot ulcer care. Wound debrided and padded. Brace adjusted on left side. Left lower limb casted for Ritch brace. Home care instruction given. Return in 2 weeks.

## 2017-09-04 ENCOUNTER — Encounter: Payer: Self-pay | Admitting: Podiatry

## 2017-09-04 ENCOUNTER — Ambulatory Visit (INDEPENDENT_AMBULATORY_CARE_PROVIDER_SITE_OTHER): Payer: Medicare Other | Admitting: Podiatry

## 2017-09-04 DIAGNOSIS — M21372 Foot drop, left foot: Secondary | ICD-10-CM

## 2017-09-04 DIAGNOSIS — G8220 Paraplegia, unspecified: Secondary | ICD-10-CM

## 2017-09-04 DIAGNOSIS — L97521 Non-pressure chronic ulcer of other part of left foot limited to breakdown of skin: Secondary | ICD-10-CM | POA: Diagnosis not present

## 2017-09-04 DIAGNOSIS — M21371 Foot drop, right foot: Secondary | ICD-10-CM | POA: Diagnosis not present

## 2017-09-04 DIAGNOSIS — R262 Difficulty in walking, not elsewhere classified: Secondary | ICD-10-CM

## 2017-09-04 NOTE — Patient Instructions (Signed)
Follow up on left foot ulcer. Improvement noted. Debrided. Strapping and padding done. Return in 2 weeks.

## 2017-09-04 NOTE — Progress Notes (Signed)
Subjective: 41 y.o. year old male patient presents using walker on AFO attached lower limbs for follow up on left foot ulcer. He is accompanied by his parents.  HPI: Ulcer at the base of 5th metatarsal since 07/20/17 due to ill fitting metal AFO. Been treated with debridement and off loading padding. Slow improvement seen. Ordered a new AFO during last visit. Currently diagnosed with Ependymoma of spinal cord that led to weakness of lower limbs with paraparesis, Neuromuscular disorder(Neuromyopathy), Neurofibromatosis, Left Renal mass, Atypical intracranial meningioma with H/O spinal fusion.  Objective: Dermatologic: Decreased ulcer size at the base of 5th metatarsal 0.4 cm with callus build up. Base of ulcer is more superficial. Base is dry. Mild hyperemia surrounding the lesion. No drainage noted. Vascular: Pedal pulses are faintly palpable. Orthopedic: Severe cavovarus deformity with lateral weight shifting. Atrophied lower limb with loss of muscle mass bilateral. Minimum strength on Anterior muscle group bilateral. Foot drop during ambulation requiring AFO. Neurologic: Neurologic disorder affecting lower limb motor and sensory nerves.  Assessment: Multiple neurologic disorder affecting motor and sensory nerves bilateral lower limbs. Foot drop with difficulty walking. Paraparesis with compromised lower limb capacity. Ill fitting left AFO requiring replacement. Ulcer sub 5 left foot, limited to breakdown of skin, slow improvement, 0.4 cm opening in center.  Treatment: Wound debrided and padded. Strapping done to increase valgus rotation of left foot and ankle.  Home care instruction given with supplies. Continue with off loading as much as possible. Return in 2 weeks.

## 2017-09-18 ENCOUNTER — Ambulatory Visit (INDEPENDENT_AMBULATORY_CARE_PROVIDER_SITE_OTHER): Payer: Medicare Other | Admitting: Podiatry

## 2017-09-18 ENCOUNTER — Encounter: Payer: Self-pay | Admitting: Podiatry

## 2017-09-18 DIAGNOSIS — M21372 Foot drop, left foot: Secondary | ICD-10-CM

## 2017-09-18 DIAGNOSIS — G8220 Paraplegia, unspecified: Secondary | ICD-10-CM

## 2017-09-18 DIAGNOSIS — L97521 Non-pressure chronic ulcer of other part of left foot limited to breakdown of skin: Secondary | ICD-10-CM | POA: Diagnosis not present

## 2017-09-18 DIAGNOSIS — M21371 Foot drop, right foot: Secondary | ICD-10-CM | POA: Diagnosis not present

## 2017-09-18 NOTE — Progress Notes (Signed)
Subjective: 41 y.o. year old male patient presents wearing AFO and using walker for follow up on ulcer on left foot. He is accompanied by his parents. Patient brought back Ritch brace that was dispensed last week. Stated that he is not able to wear the brace. Unable to put his foot in the shoe.  HPI: Ulcer at the base of 5th metatarsal since 07/20/17 due to ill fitting metal AFO. Been treated with debridement and off loading padding.  Achieved healed wound base left foot. Unable to handle Ritch brace. Currently diagnosed with Ependymoma of spinal cord that led to weakness of lower limbs with paraparesis, Neuromuscular disorder(Neuromyopathy), Neurofibromatosis, Left Renal mass, Atypical intracranial meningioma with H/O spinal fusion.  Objective: Dermatologic: Improving ulcer with closed off base and red callus with intradermal bleeding.  Vascular: Pedal pulses are all palpable. Orthopedic: Severe cavovarus deformity with lateral weight shifting. Atrophied lower limb muscle mass bilateral. Weak anterior muscle group bilateral. Foot drop during ambulation requiring AFO. Neurologic: Neurologic disorder affecting lower limb motor and sensory nerves.   Assessment: Foot drop with difficulty walking. Paraparesis. Cavovarus foot bilateral. Ulcer at the base of 5th metatarsal left with closed base.  Treatment: Wound debrided and padded. Existing AFO adjusted with added padding. Home care instruction with extra pad dispensed. Return in 2 weeks. If continue to improve, will extend visit interval.

## 2017-09-18 NOTE — Patient Instructions (Signed)
Noted of healed ulcer base. Continue current level of activity with daily care. Return in 2 weeks.

## 2017-10-01 ENCOUNTER — Encounter: Payer: Self-pay | Admitting: Podiatry

## 2017-10-01 ENCOUNTER — Ambulatory Visit: Payer: Medicare Other | Admitting: Podiatry

## 2017-10-01 DIAGNOSIS — L97521 Non-pressure chronic ulcer of other part of left foot limited to breakdown of skin: Secondary | ICD-10-CM | POA: Diagnosis not present

## 2017-10-01 DIAGNOSIS — M21372 Foot drop, left foot: Secondary | ICD-10-CM

## 2017-10-01 DIAGNOSIS — M21371 Foot drop, right foot: Secondary | ICD-10-CM

## 2017-10-01 DIAGNOSIS — G8220 Paraplegia, unspecified: Secondary | ICD-10-CM

## 2017-10-01 NOTE — Patient Instructions (Signed)
Follow up on left foot ulcer. Bleeding ulcer with more skin breakdown from pressure. Placed in CAM walker with aperture pad. Wound debrided and dressed. Return in 2 weeks.

## 2017-10-01 NOTE — Progress Notes (Signed)
Subjective: 41 y.o. year old male patient presents for follow up on ulcer left foot. He is in old AFO with walker to ambulate. New AFO was not able to manageable for him. He noted of more drainage from the wound.  HPI: Ulcer at the base of 5th metatarsal since 07/20/17 due to ill fitting metal AFO. Been treated with debridement and off loading padding.  Achieved healed wound base left foot. Unable to handle Ritch brace. Currently diagnosed with Ependymoma of spinal cord that led to weakness of lower limbs with paraparesis, Neuromuscular disorder(Neuromyopathy), Neurofibromatosis, Left Renal mass, Atypical intracranial meningioma with H/O spinal fusion.  Objective: Dermatologic: ulcer under the 5th metatarsal base at plantar mid foot with 0.5 cm opening and light moisture at the base. Surrounded with white callused tissue covering about 2.5 cm diameter. Vascular: Pedal pulses are all palpable. Orthopedic: Severe cavovarus with lateral weight sifting. Atrophied lower limb muscles bilateral with foot drop bilateral. Neurologic: Neurologic disorder with sensory and motor deficit bilateral lower limbs.  Assessment: Bilateral foot drop with difficulty walking. Paraparesis. Cavovarus foot with lateral weight shifting. Protruding 5th metatarsal base with open ulcer left.  Treatment: Wound debrided. Wound dressed with home care instruction. Aperture pad placed in CAM walker. Patient was able to handle CAM walker. Patient will seek for another kind of AFO for the left foot. Rx for AFO given.

## 2017-10-15 ENCOUNTER — Encounter: Payer: Self-pay | Admitting: Podiatry

## 2017-10-15 ENCOUNTER — Ambulatory Visit: Payer: Medicare Other | Admitting: Podiatry

## 2017-10-15 DIAGNOSIS — G8220 Paraplegia, unspecified: Secondary | ICD-10-CM | POA: Diagnosis not present

## 2017-10-15 DIAGNOSIS — M21372 Foot drop, left foot: Secondary | ICD-10-CM | POA: Diagnosis not present

## 2017-10-15 DIAGNOSIS — M21371 Foot drop, right foot: Secondary | ICD-10-CM

## 2017-10-15 DIAGNOSIS — L97521 Non-pressure chronic ulcer of other part of left foot limited to breakdown of skin: Secondary | ICD-10-CM | POA: Diagnosis not present

## 2017-10-15 NOTE — Patient Instructions (Signed)
Seen for follow up on left foot ulcer. Noted of improving wound with off loading CAM walker. Wound debrided and padding added to CAM walker. Advised to use Mefix tape for the wound dressing at home. Return in 2 weeks.

## 2017-10-15 NOTE — Progress Notes (Signed)
Subjective: 41 y.o. year old male patient presents for follow up on left foot ulcer. He is on CAM walker with off loading aperture pad under the lesion. Walking with aid of walker wearing AFO on right side. He is able to put on and off the CAM walker by himself. Stated that he just start feeling pain under the lesion area.  HPI: Ulcer at the base of 5th metatarsal since 07/20/17 due to ill fitting metal AFO. Been treated with debridement and off loading padding. Achieved healed wound base left foot.Unable to handle Ritch brace. Currently diagnosed with Ependymoma of spinal cord that led to weakness of lower limbs with paraparesis, Neuromuscular disorder(Neuromyopathy), Neurofibromatosis, Left Renal mass, Atypical intracranial meningioma with H/O spinal fusio  Objective: Dermatologic: Pressure ulcer under the 5th Metatarsa base plantar, 0.3 cm opening. Minimum drainage noted. No associated edema or erythema noted. Vascular: Pedal pulses are all palpable. Orthopedic: Severe cavovarus foot with lateral weight shifting. Atrophied lower limb muscle mass bilateral with drop foot bilateral. Neurologic: Sensory and motor nerve deficit lower limbs bilateral.  Assessment: Pressure  Ulcer sub 5th metatarsal base left with improvement since been using off loading CAM walker left foot. Cavovatus foot with lateral weight shifting. Protruding, hypertrophic 5th metatarsal base with open ulcer left foot.  Treatment: Lesion debrided and Amerigel ointment dressing applied. Off loading CAM walker re enforced with added 1/4" felt pad. Return in 2 weeks.

## 2017-10-29 ENCOUNTER — Ambulatory Visit: Payer: Medicare Other | Admitting: Podiatry

## 2017-12-04 ENCOUNTER — Ambulatory Visit: Payer: Medicare Other | Admitting: Podiatry

## 2017-12-04 ENCOUNTER — Encounter: Payer: Self-pay | Admitting: Podiatry

## 2017-12-04 ENCOUNTER — Telehealth: Payer: Self-pay | Admitting: *Deleted

## 2017-12-04 ENCOUNTER — Ambulatory Visit (INDEPENDENT_AMBULATORY_CARE_PROVIDER_SITE_OTHER): Payer: Medicare Other

## 2017-12-04 VITALS — BP 126/83 | HR 69

## 2017-12-04 DIAGNOSIS — L03119 Cellulitis of unspecified part of limb: Secondary | ICD-10-CM

## 2017-12-04 DIAGNOSIS — R52 Pain, unspecified: Secondary | ICD-10-CM

## 2017-12-04 DIAGNOSIS — M869 Osteomyelitis, unspecified: Secondary | ICD-10-CM

## 2017-12-04 DIAGNOSIS — L97521 Non-pressure chronic ulcer of other part of left foot limited to breakdown of skin: Secondary | ICD-10-CM | POA: Diagnosis not present

## 2017-12-04 DIAGNOSIS — S81802A Unspecified open wound, left lower leg, initial encounter: Secondary | ICD-10-CM | POA: Diagnosis not present

## 2017-12-04 DIAGNOSIS — L02619 Cutaneous abscess of unspecified foot: Secondary | ICD-10-CM | POA: Diagnosis not present

## 2017-12-04 MED ORDER — MUPIROCIN 2 % EX OINT: 1.0000 "application " | TOPICAL_OINTMENT | Freq: Two times a day (BID) | CUTANEOUS | 2 refills | Status: DC

## 2017-12-04 MED ORDER — DOXYCYCLINE HYCLATE 100 MG PO TABS: 100.0000 mg | ORAL_TABLET | Freq: Two times a day (BID) | ORAL | 0 refills | Status: DC

## 2017-12-04 NOTE — Telephone Encounter (Signed)
-----   Message from Trula Slade, DPM sent at 12/04/2017 11:59 AM EST ----- Can you please order an MRI of the left foot to elevate for abscess/osteomyelitis. The wound is at the 5th metatarsal base so I think a foot MRI

## 2017-12-04 NOTE — Telephone Encounter (Signed)
Orders to J. Quintana, RN for pre-cert and faxed to [facility]. 

## 2017-12-04 NOTE — Progress Notes (Signed)
Subjective:    Patient ID: Cameron Hale, male    DOB: 07-18-1976, 42 y.o.   [name]: [id number]  HPI Cameron Hale presents the office with his mom for concerns of a left foot ulcer that is not healing which is been ongoing for the last several months.  It is on the left foot, fifth metatarsal base.  He has previously been seen by Dr. Caffie Pinto for this.  He wears an AFO which has been rubbing.  The AFO is several years old.  He has been putting a cream on top of the wound by Dr. Caffie Pinto gave him.  He has been wearing the shoes as well as the brace.  He states that he has had some swelling to the left foot over the last couple of months but has been getting somewhat worse.  He has not seen any drainage.  He has no other concerns today.   Review of Systems  All other systems reviewed and are negative.  No past medical history on file.     Current Outpatient Medications:  .  acetaminophen (TYLENOL) 325 MG tablet, Take 650 mg by mouth., Disp: , Rfl:  .  diazepam (VALIUM) 5 MG tablet, Take 5 mg by mouth., Disp: , Rfl:  .  ketoconazole (NIZORAL) 200 MG tablet, Take 200 mg by mouth daily., Disp: , Rfl: 0 .  Omega-3 1000 MG CAPS, Take 1 g by mouth., Disp: , Rfl:  .  oxyCODONE (OXY IR/ROXICODONE) 5 MG immediate release tablet, Take 5 mg by mouth., Disp: , Rfl:  .  doxycycline (VIBRA-TABS) 100 MG tablet, Take 1 tablet (100 mg total) by mouth 2 (two) times daily., Disp: 20 tablet, Rfl: 0 .  mupirocin ointment (BACTROBAN) 2 %, Apply 1 application topically 2 (two) times daily., Disp: 30 g, Rfl: 2  Allergies  Allergen Reactions  . Morphine Itching    Social History   Socioeconomic History  . Marital status: Married    Spouse name: Not on file  . Number of children: Not on file  . Years of education: Not on file  . Highest education level: Not on file  Social Needs  . Financial resource strain: Not on file  . Food insecurity - worry: Not on file  . Food insecurity - inability: Not on file  .  Transportation needs - medical: Not on file  . Transportation needs - non-medical: Not on file  Occupational History  . Not on file  Tobacco Use  . Smoking status: Never Smoker  . Smokeless tobacco: Never Used  Substance and Sexual Activity  . Alcohol use: Not on file  . Drug use: Not on file  . Sexual activity: Not on file  Other Topics Concern  . Not on file  Social History Narrative  . Not on file        Objective:   Physical Exam General: AAO x3, NAD  Dermatological: On the lateral aspect the left foot on the fifth metatarsal base is a hyperkeratotic lesion with a central ulceration.  It also appears that there is some epidermal lysis due to an old blister around the periphery of the hyperkeratotic lesion.  After debridement the wound measured approximately 2.5 x 1.5 cm and is superficial with a granular wound base.  During the debridement there was a small amount of superficial purulence identified sharply underneath the hyperkeratotic tissue.  This area was cultured today.  After debridement there is no further purulence identified and there is no fluctuation or crepitation.  There is no malodor.  There is no other open lesions or pre-ulcerative lesions identified today.   Vascular: Dorsalis Pedis artery and Posterior Tibial artery pedal pulses are 2/4 on the right and 1/4 on the left but this may be due to swelling with immedate capillary fill time There is no pain with calf compression, swelling, warmth, erythema.   Neruologic: Sensation decreased with Cameron Hale monofilament.   Musculoskeletal: MMT decreased which is chronic given underlying medical conditions  Gait: Presents in wheelchair but he does ambulate with a walker      Assessment & Plan:  42 year old male left fifth metatarsal base ulceration, abscess; cellulitis -Treatment options discussed including all alternatives, risks, and complications -Etiology of symptoms were discussed -X-rays were obtained  and reviewed with the patient.  There is no definitive evidence of cortical destruction suggestive of osteomyelitis but there is a questionable area of the fifth metatarsal base. -Given the x-ray findings as well as longevity the wound of the recommended MRI of this was ordered today. -Sharply debrided the wound due to medical necessity to healthy, granular tissue.  This was completed with a #312 blade scalpel after verbal consent was obtained and the wound was cleaned.  A wound culture was obtained to the purulence. -Antibiotic ointment and a dressing was applied.  Prescribed mupirocin ointment -Prescribed doxycycline -Continue boot was dispensed for offloading. -An ABI was done in the office today which was normal.  The left is 1.12 and the right was 1.15 -Ordered CBC, CMP, CRP, ESR -Follow-up in 1 week or sooner if needed.  Call with any questions or concerns meantime.  Trula Slade DPM

## 2017-12-05 LAB — COMPREHENSIVE METABOLIC PANEL
AG Ratio: 1.6 (calc) (ref 1.0–2.5)
ALKALINE PHOSPHATASE (APISO): 98 U/L (ref 40–115)
ALT: 24 U/L (ref 9–46)
AST: 21 U/L (ref 10–40)
Albumin: 4.1 g/dL (ref 3.6–5.1)
BILIRUBIN TOTAL: 0.5 mg/dL (ref 0.2–1.2)
BUN: 15 mg/dL (ref 7–25)
CHLORIDE: 103 mmol/L (ref 98–110)
CO2: 28 mmol/L (ref 20–32)
CREATININE: 0.6 mg/dL (ref 0.60–1.35)
Calcium: 9.2 mg/dL (ref 8.6–10.3)
GLOBULIN: 2.6 g/dL (ref 1.9–3.7)
Glucose, Bld: 78 mg/dL (ref 65–99)
POTASSIUM: 4.2 mmol/L (ref 3.5–5.3)
Sodium: 141 mmol/L (ref 135–146)
Total Protein: 6.7 g/dL (ref 6.1–8.1)

## 2017-12-05 LAB — CBC WITH DIFFERENTIAL/PLATELET
BASOS PCT: 0.8 %
Basophils Absolute: 59 cells/uL (ref 0–200)
EOS ABS: 89 {cells}/uL (ref 15–500)
Eosinophils Relative: 1.2 %
HEMATOCRIT: 41.7 % (ref 38.5–50.0)
Hemoglobin: 14.2 g/dL (ref 13.2–17.1)
LYMPHS ABS: 1332 {cells}/uL (ref 850–3900)
MCH: 29.5 pg (ref 27.0–33.0)
MCHC: 34.1 g/dL (ref 32.0–36.0)
MCV: 86.7 fL (ref 80.0–100.0)
MPV: 11.1 fL (ref 7.5–12.5)
Monocytes Relative: 8.6 %
NEUTROS PCT: 71.4 %
Neutro Abs: 5284 cells/uL (ref 1500–7800)
PLATELETS: 249 10*3/uL (ref 140–400)
RBC: 4.81 10*6/uL (ref 4.20–5.80)
RDW: 12.7 % (ref 11.0–15.0)
TOTAL LYMPHOCYTE: 18 %
WBC: 7.4 10*3/uL (ref 3.8–10.8)
WBCMIX: 636 {cells}/uL (ref 200–950)

## 2017-12-05 LAB — SEDIMENTATION RATE: Sed Rate: 14 mm/h (ref 0–15)

## 2017-12-05 LAB — C-REACTIVE PROTEIN: CRP: 5.1 mg/L (ref <8.0)

## 2017-12-07 LAB — WOUND CULTURE
MICRO NUMBER: 90072453
SPECIMEN QUALITY: ADEQUATE

## 2017-12-08 ENCOUNTER — Other Ambulatory Visit: Payer: Self-pay | Admitting: Podiatry

## 2017-12-08 MED ORDER — CIPROFLOXACIN HCL 500 MG PO TABS: 500.0000 mg | ORAL_TABLET | Freq: Two times a day (BID) | ORAL | 0 refills | Status: DC

## 2017-12-09 ENCOUNTER — Telehealth: Payer: Self-pay | Admitting: *Deleted

## 2017-12-09 NOTE — Telephone Encounter (Signed)
-----   Message from Trula Slade, DPM sent at 12/08/2017  5:22 PM EST ----- Please let him know that his wound culture is growing 2 bacteria. Continue doxycycline but I am also calling in ciprofloxacin for him as well.

## 2017-12-09 NOTE — Telephone Encounter (Addendum)
Left message on mobile informing pt that due to the importance of the message I would leave it on his voicemail, that Dr. Jacqualyn Posey had reviewed the results of his lab and wanted him to continue the doxycycline and an additional antibiotic ciprofloxacin, to please call to confirm the message was received.

## 2017-12-10 ENCOUNTER — Telehealth: Payer: Self-pay | Admitting: *Deleted

## 2017-12-10 NOTE — Telephone Encounter (Signed)
Telephone message started entered in error.

## 2017-12-10 NOTE — Telephone Encounter (Signed)
I spoke with pt and he states he got his medication and understood the instructions. Pt asked what bacteria grew out and I reviewed 12/04/2017 wound culture which showed MRSA and Pseudomonas and informed pt.

## 2017-12-10 NOTE — Telephone Encounter (Signed)
-----   Message from Trula Slade, DPM sent at 12/08/2017  5:22 PM EST ----- Please let him know that his wound culture is growing 2 bacteria. Continue doxycycline but I am also calling in ciprofloxacin for him as well.

## 2017-12-12 ENCOUNTER — Encounter: Payer: Self-pay | Admitting: Podiatry

## 2017-12-12 ENCOUNTER — Ambulatory Visit: Payer: Medicare Other | Admitting: Podiatry

## 2017-12-12 VITALS — BP 135/85 | HR 75 | Temp 96.7°F | Resp 18

## 2017-12-12 DIAGNOSIS — L03119 Cellulitis of unspecified part of limb: Secondary | ICD-10-CM

## 2017-12-12 DIAGNOSIS — L02619 Cutaneous abscess of unspecified foot: Secondary | ICD-10-CM

## 2017-12-12 DIAGNOSIS — L97521 Non-pressure chronic ulcer of other part of left foot limited to breakdown of skin: Secondary | ICD-10-CM | POA: Diagnosis not present

## 2017-12-12 MED ORDER — DOXYCYCLINE HYCLATE 100 MG PO TABS: 100.0000 mg | ORAL_TABLET | Freq: Two times a day (BID) | ORAL | 0 refills | Status: DC

## 2017-12-12 NOTE — Patient Instructions (Signed)
Monitor for any signs/symptoms of infection. Call the office immediately if any occur or go directly to the emergency room. Call with any questions/concerns.  

## 2017-12-14 NOTE — Progress Notes (Signed)
Subjective: Mohamed presents the office today for follow-up evaluation of ulceration to the left foot.  He has noticed some dark color on the wound.  He denies any drainage or pus.  Overall the swelling is much improved.  He has continued on the doxycycline as well as the ciprofloxacin.  He has been using a small amount of mupirocin ointment to the wound daily.  He denies any drainage or pus and he denies any redness or red streaks.  He presents today with his mom who also states that the wound is looking better. Denies any systemic complaints such as fevers, chills, nausea, vomiting. No acute changes since last appointment, and no other complaints at this time.   Objective: AAO x3, NAD DP/PT pulses palpable bilaterally, CRT less than 3 seconds On the plantar aspect the right foot sub-fifth metatarsal base continues to be ulceration with a granular wound base with surrounding hyperkeratotic tissue.  There is dried blood along the periphery of the wound and hyperkeratotic tissue.  Upon debridement today the wound is better and measures approximately 1.5 x 1.5 cm.  There is no surrounding erythema, ascending cellulitis.  There is no fluctuation or crepitation.  There is no malodor. There is decrease edema to the foot. No open lesions or pre-ulcerative lesions.  No pain with calf compression, swelling, warmth, erythema  Assessment: Ulceration left foot   Plan: -All treatment options discussed with the patient including all alternatives, risks, complications.  -The wound was sharply debrided today healthy, granular tissue utilizing 312 blade scalpel.  Recommended to continue the small amount of antibiotic ointment.  He did not get the MRI done this was ordered.  Gave him the information and phone number to call to get this scheduled. -Blood work was reviewed.  Also reviewed the wound culture with him again today.  Continue antibiotics.  Refill doxycycline.  I want to continue on both antibiotics until I  see him back next appointment. -Offloading at all times. -Follow-up as scheduled or sooner if needed.  Call any questions or concerns.  Monitor for any signs or symptoms of infection worsening go to the ER immediately should any occur. -Patient encouraged to call the office with any questions, concerns, change in symptoms.   Trula Slade DPM

## 2017-12-17 ENCOUNTER — Telehealth: Payer: Self-pay | Admitting: Podiatry

## 2017-12-17 MED ORDER — CIPROFLOXACIN HCL 500 MG PO TABS: 500.0000 mg | ORAL_TABLET | Freq: Two times a day (BID) | ORAL | 0 refills | Status: DC

## 2017-12-17 MED ORDER — DOXYCYCLINE HYCLATE 100 MG PO TABS: 100.0000 mg | ORAL_TABLET | Freq: Two times a day (BID) | ORAL | 0 refills | Status: DC

## 2017-12-17 NOTE — Telephone Encounter (Signed)
I was calling because I wasn't sure if I needed to continue my antibiotic of ciprofloxacin. I only have a few days left and I didn't know if I needed to call some more in or not. Please call me back at [phone]. Thank you.

## 2017-12-17 NOTE — Addendum Note (Signed)
Addended by: Harriett Sine D on: 12/17/2017 09:08 AM   Modules accepted: Orders

## 2017-12-17 NOTE — Telephone Encounter (Signed)
I informed pt that from Dr. Leigh Aurora last clinical note he wanted him to take doxycycline and ciprofloxacin until seen again in office, and I had sent the orders to the [facility].

## 2017-12-24 ENCOUNTER — Ambulatory Visit
Admission: RE | Admit: 2017-12-24 | Discharge: 2017-12-24 | Disposition: A | Discharge status: Home / Self Care | Payer: Medicare Other | Source: Ambulatory Visit | Attending: Podiatry | Admitting: Podiatry

## 2017-12-24 DIAGNOSIS — M869 Osteomyelitis, unspecified: Secondary | ICD-10-CM

## 2017-12-24 MED ORDER — GADOBENATE DIMEGLUMINE 529 MG/ML IV SOLN
16.0000 mL | Freq: Once | INTRAVENOUS | Status: AC | PRN
  Administered 2017-12-24: 16 mL via INTRAVENOUS

## 2017-12-25 ENCOUNTER — Encounter: Payer: Self-pay | Admitting: Podiatry

## 2017-12-25 ENCOUNTER — Ambulatory Visit: Payer: Medicare Other | Admitting: Podiatry

## 2017-12-25 DIAGNOSIS — L97521 Non-pressure chronic ulcer of other part of left foot limited to breakdown of skin: Secondary | ICD-10-CM | POA: Diagnosis not present

## 2017-12-29 ENCOUNTER — Ambulatory Visit: Payer: Medicare Other | Admitting: Orthotics

## 2017-12-29 DIAGNOSIS — R262 Difficulty in walking, not elsewhere classified: Secondary | ICD-10-CM

## 2017-12-29 DIAGNOSIS — G8220 Paraplegia, unspecified: Secondary | ICD-10-CM

## 2017-12-29 DIAGNOSIS — D333 Benign neoplasm of cranial nerves: Secondary | ICD-10-CM

## 2017-12-29 DIAGNOSIS — Q8502 Neurofibromatosis, type 2: Secondary | ICD-10-CM

## 2017-12-29 NOTE — Progress Notes (Signed)
Patient came in to be evaluated, measured for brace to address foot drop. His current brace LEFT is problematic b/c the lateral strut is causing tissue breakdown on the lateral/plantar surface of foot.    Patient said he would be comfortable in trying a Richie style brace w/ D rings on lateral side, plastizote lining, and tammarack dorsi-assist to aid in foot drop.

## 2017-12-30 NOTE — Progress Notes (Signed)
Subjective: Cameron Hale presents the office today for follow-up evaluation of ulceration of the left foot on the fifth metatarsal base.  He also presents to discuss the MRI results.  He states that he is doing better he has not had any drainage or pus he denies any increase in redness or swelling to his feet.  He is still on antibiotics.  Overall he states he feels well and he has no new concerns.  He is remained in the cam boot using a walker.  Denies any systemic complaints such as fevers, chills, nausea, vomiting. No acute changes since last appointment, and no other complaints at this time.   Objective: AAO x3, NAD DP/PT pulses palpable bilaterally, CRT less than 3 seconds On the base of the right foot sub-fifth metatarsal base is a hyperkeratotic lesion.  Upon debridement there is a very small superficial abrasion type lesion. There is no probing, undermining or tunneling.  Overall the wound appears to be much improved and is almost healed today.  There is no surrounding erythema, ascending cellulitis.  No fluctuation or crepitation.  There is no malodor.  No other open lesions or pre-ulcerative lesions identified bilaterally.  There is no clinical signs of infection noted today.  No evidence of abscess.  There is decreased swelling to the foot. No pain with calf compression, swelling, warmth, erythema      Assessment: Healing ulceration left foot fifth metatarsal base without signs of infection  Plan: -All treatment options discussed with the patient including all alternatives, risks, complications.  -MRI results were reviewed with the patient which did not reveal any evidence of osteomyelitis and there is no evidence of an abscess.  Concern for cellulitis although mild. -I sharply debrided the hyperkeratotic lesion to reveal the underlying wound is almost completely healed.  I want him to continue dressing changes however.  I want to continue the cam boot.  I have Liliane Channel evaluate him today for the  braces and potentially new braces. -Continue offloading. -Finished course of antibiotics -Monitor for any clinical signs or symptoms of infection and directed to call the office immediately should any occur or go to the ER. -Follow-up as scheduled or sooner if needed.  Encouraged to call any questions or concerns in the meantime he agrees with this plan has no further questions today.  Trula Slade DPM

## 2018-01-09 ENCOUNTER — Ambulatory Visit: Payer: Medicare Other | Admitting: Podiatry

## 2018-01-09 DIAGNOSIS — L97521 Non-pressure chronic ulcer of other part of left foot limited to breakdown of skin: Secondary | ICD-10-CM

## 2018-01-09 NOTE — Progress Notes (Signed)
Subjective: Cameron Hale  presents the office today for follow-up evaluation of left foot wound fifth metatarsal base plantarly.  He states he is doing well and is not been putting a dressing on the area as he was doing this but there is no drainage and felt that the wound is healed.  He denies any increase in swelling or redness.  He has remained in the cam boot using a walker until he gets his new brace which is negative for loss of weight. Denies any systemic complaints such as fevers, chills, nausea, vomiting. No acute changes since last appointment, and no other complaints at this time.   Objective: AAO x3, NAD DP/PT pulses palpable bilaterally, CRT less than 3 seconds On the fifth metatarsal base of the left foot is a hyperkeratotic lesion.  Upon debridement there was no underlying ulceration, drainage or any signs of infection present.  The wound appears to be healed.  There is no surrounding erythema, ascending cellulitis.  There is no fluctuation or crepitation.  No malodor.  No other open lesions. No pain with calf compression, swelling, warmth, erythema  Prior to debridement:     Assessment: Healed wound left fifth metatarsal base  Plan: -All treatment options discussed with the patient including all alternatives, risks, complications.  -After verbal consent was obtained the area was cleaned with alcohol and the callus was debrided today without any complications or bleeding.  The area appears to be healed today and there is no definitive wound identified.  There was some dried blood within the callus but after debridement there is no open sore.  I do recommend a small amount of moisturizer to the area daily.  Continue the cam boot and walker until he gets his new brace.  I will see him back in 3 weeks.  He has been going to the [facility] and doing some exercises.  Discussed with him the light exercises on his lower extremity and to monitor the area daily for any reoccurrence of  ulceration. -Patient encouraged to call the office with any questions, concerns, change in symptoms.   Trula Slade DPM

## 2018-01-22 ENCOUNTER — Telehealth: Payer: Self-pay | Admitting: Podiatry

## 2018-01-22 NOTE — Telephone Encounter (Signed)
Pt left voicemail asking if brace was in yet.  I returned call and voicemail is full. After looking in [employer]est[employer] the brace is expected to be delivered 3.12.19 so pt could get scheduled after 3.12.19 to see [facility].

## 2018-01-28 ENCOUNTER — Ambulatory Visit (INDEPENDENT_AMBULATORY_CARE_PROVIDER_SITE_OTHER): Payer: Medicare Other | Admitting: Orthotics

## 2018-01-28 DIAGNOSIS — D333 Benign neoplasm of cranial nerves: Secondary | ICD-10-CM

## 2018-01-28 DIAGNOSIS — M21372 Foot drop, left foot: Secondary | ICD-10-CM

## 2018-01-28 DIAGNOSIS — R262 Difficulty in walking, not elsewhere classified: Secondary | ICD-10-CM | POA: Diagnosis not present

## 2018-01-28 DIAGNOSIS — M21371 Foot drop, right foot: Secondary | ICD-10-CM

## 2018-01-28 DIAGNOSIS — L03119 Cellulitis of unspecified part of limb: Secondary | ICD-10-CM

## 2018-01-28 DIAGNOSIS — L02619 Cutaneous abscess of unspecified foot: Secondary | ICD-10-CM

## 2018-01-28 DIAGNOSIS — Q8502 Neurofibromatosis, type 2: Secondary | ICD-10-CM

## 2018-01-28 DIAGNOSIS — S81802A Unspecified open wound, left lower leg, initial encounter: Secondary | ICD-10-CM

## 2018-01-28 NOTE — Progress Notes (Signed)
Patient  came in to pick up Cameron Hale  brace w/ dorsi assist springs.  The brace fit well and immediately patient's  gait approved.  The brace provided desired m-l stability in frontal/transverse planes and aided in dorsiflexion in saggital plane. Patient was able to don and doff brace with minimal difficulty.  Overall patient pleased with fit and functionality of brace.

## 2018-02-06 ENCOUNTER — Ambulatory Visit (INDEPENDENT_AMBULATORY_CARE_PROVIDER_SITE_OTHER): Payer: Medicare Other | Admitting: Podiatry

## 2018-02-06 DIAGNOSIS — M21371 Foot drop, right foot: Secondary | ICD-10-CM | POA: Diagnosis not present

## 2018-02-06 DIAGNOSIS — R262 Difficulty in walking, not elsewhere classified: Secondary | ICD-10-CM

## 2018-02-06 DIAGNOSIS — Q8502 Neurofibromatosis, type 2: Secondary | ICD-10-CM | POA: Diagnosis not present

## 2018-02-06 DIAGNOSIS — L97521 Non-pressure chronic ulcer of other part of left foot limited to breakdown of skin: Secondary | ICD-10-CM | POA: Diagnosis not present

## 2018-02-06 DIAGNOSIS — M21372 Foot drop, left foot: Secondary | ICD-10-CM | POA: Diagnosis not present

## 2018-02-09 NOTE — Progress Notes (Signed)
Subjective: 42 year old male presents the office today with his mom for follow-up of a wound to the left foot.  They state that this area looks very well and he has not had any open sores or any drainage or pus.  He is concerned about the braces the brace that was dispensed is causing irritation to the skin and because of this he has stopped wearing his come back in the cam boot.  He was irritating in the calf and it was rubbing. Denies any systemic complaints such as fevers, chills, nausea, vomiting. No acute changes since last appointment, and no other complaints at this time.   Objective: AAO x3, NAD DP/PT pulses palpable bilaterally, CRT less than 3 seconds Along the fifth metatarsal base on the left foot is a hyperkeratotic lesion.  Upon debridement the underlying wound appears to be healed.  There is no edema, erythema, increase in warmth.  There is no drainage or pus or any clinical signs of infection. Small amount of irritation on the calf on the left side from where the brace is rubbing but there is no signs of infection. No open lesions or pre-ulcerative lesions.  No pain with calf compression, swelling, warmth, erythema  Assessment: Ulceration left foot, healed  Plan: -All treatment options discussed with the patient including all alternatives, risks, complications.  -I debrided the hyperkeratotic tissue to the any complications or bleeding.  Appears that the wound is healed.  Discussed with him the brace.  I will have him follow-up with record Betha for evaluation of the brace.  The patient was inquiring about possibly other types of braces.  I will have him talk to regular Betha about this.  Continue moisturizer along the callus area daily.  Monitoring skin breakdown. -The area is rubbing on that He can use a small amount of antibiotic ointment and a bandage daily.  Continue in cam boot for now.  Monitor for infection. -Patient encouraged to call the office with any questions,  concerns, change in symptoms.   Trula Slade DPM

## 2018-02-10 ENCOUNTER — Ambulatory Visit: Payer: Medicare Other | Admitting: Orthotics

## 2018-02-10 DIAGNOSIS — D333 Benign neoplasm of cranial nerves: Secondary | ICD-10-CM

## 2018-02-10 DIAGNOSIS — R262 Difficulty in walking, not elsewhere classified: Secondary | ICD-10-CM

## 2018-02-10 NOTE — Progress Notes (Signed)
Patient came in for adjustments on his Cameron Hale brace:  Add paddind/interface on lateral strut to take pressure off leg, change directions of lower strap.

## 2018-03-10 ENCOUNTER — Ambulatory Visit: Payer: Medicare Other | Admitting: Podiatry

## 2018-03-17 ENCOUNTER — Ambulatory Visit: Payer: Medicare Other | Admitting: Podiatry

## 2018-04-30 ENCOUNTER — Ambulatory Visit (INDEPENDENT_AMBULATORY_CARE_PROVIDER_SITE_OTHER): Payer: Medicare Other

## 2018-04-30 ENCOUNTER — Other Ambulatory Visit: Payer: Self-pay | Admitting: Podiatry

## 2018-04-30 ENCOUNTER — Encounter: Payer: Self-pay | Admitting: Podiatry

## 2018-04-30 ENCOUNTER — Ambulatory Visit: Payer: Medicare Other | Admitting: Podiatry

## 2018-04-30 VITALS — BP 119/72 | HR 78 | Resp 16

## 2018-04-30 DIAGNOSIS — L97501 Non-pressure chronic ulcer of other part of unspecified foot limited to breakdown of skin: Secondary | ICD-10-CM | POA: Diagnosis not present

## 2018-04-30 DIAGNOSIS — M79672 Pain in left foot: Secondary | ICD-10-CM

## 2018-04-30 DIAGNOSIS — L97521 Non-pressure chronic ulcer of other part of left foot limited to breakdown of skin: Secondary | ICD-10-CM

## 2018-05-04 NOTE — Progress Notes (Signed)
Subjective: Cameron Hale presents to the office today for concerns of recurrent wound to the also has the left foot.  He states [place] was at the beach when he noticed this.  He was not sure if he was wearing his brace when it started.  He states he has very minimal pain to the area but it started to hurt last week when he was at the beach.  Denies any drainage or pus and denies any red streaks. Denies any systemic complaints such as fevers, chills, nausea, vomiting. No acute changes since last appointment, and no other complaints at this time.   Objective: AAO x3, NAD- presents today with his wife DP/PT pulses palpable bilaterally, CRT less than 3 seconds On the lateral aspect of the fifth metatarsal base of the left foot there is a hyperkeratotic lesion with underlying blood underneath the area and this also appears to have an old blister on the area.  Is able to debride some loose tissue today to reveal a small underlying granular wound.  There is no probing, undermining or tunneling.  Faint minimal surrounding erythema there is no ascending cellulitis or increase in warmth.  There is no fluctuation or crepitation.  There is no malodor. No open lesions or pre-ulcerative lesions.  No pain with calf compression, swelling, warmth, erythema  Assessment: Ulceration left foot  Plan: -All treatment options discussed with the patient including all alternatives, risks, complications.  -X-rays were obtained and reviewed.  There is no evidence of acute fracture and there is no evidence of osteomyelitis -I debrided the loose tissue that any complications or bleeding.  Continue antibiotic ointment and a dressing daily.  Continue in cam boot and offloading all times.  Watch for signs or symptoms of infection to call the office for follow-up should any occur otherwise I will see him back as scheduled. -Patient encouraged to call the office with any questions, concerns, change in symptoms.   Cameron Hale  DPM

## 2018-05-12 ENCOUNTER — Ambulatory Visit: Payer: Medicare Other | Admitting: Podiatry

## 2018-05-12 DIAGNOSIS — L97501 Non-pressure chronic ulcer of other part of unspecified foot limited to breakdown of skin: Secondary | ICD-10-CM

## 2018-05-13 NOTE — Progress Notes (Signed)
Subjective: Patient presents today with his wife for follow-up evaluation of a wound to the outside aspect of his left foot.  He has a small amount of bloody drainage but denies any pus.  Denies any surrounding redness or red streaks.  Overall he states is doing better.  He has no other concerns today. Denies any systemic complaints such as fevers, chills, nausea, vomiting. No acute changes since last appointment, and no other complaints at this time.   Objective: AAO x3, NAD DP/PT pulses palpable bilaterally, CRT less than 3 seconds On the lateral aspect the left foot on the fifth metatarsal base is a hyperkeratotic lesion with evidence of dried blood under the callus.  Upon debridement there is only a small superficial abrasion type lesion with a granular wound base measuring approximately 0.5 x 0.4 cm and there is no probing, undermining or tunneling.  There is no drainage or pus.  There is no surrounding erythema, ascending cellulitis.  There is no fluctuation or crepitation or malodor. No open lesions or pre-ulcerative lesions.  No pain with calf compression, swelling, warmth, erythema  Assessment: Recurrent ulceration left fifth metatarsal base  Plan: -All treatment options discussed with the patient including all alternatives, risks, complications.  -Today I sharply debrided the wound after the area was cleaned with alcohol and verbal consent was obtained.  This was completed #312 blade scalpel to healthy, granular tissue.  Continue small amount of antibiotic ointment and a bandage daily.  Offloading at all times.  He is can be going to the beach, not I do not want to get this area wet if there is still an open wound or given the sand. -Patient encouraged to call the office with any questions, concerns, change in symptoms.     I will see him back once he gets back from vacation or sooner if needed.  Trula Slade DPM

## 2018-06-02 ENCOUNTER — Encounter: Payer: Self-pay | Admitting: Podiatry

## 2018-06-02 ENCOUNTER — Ambulatory Visit (INDEPENDENT_AMBULATORY_CARE_PROVIDER_SITE_OTHER): Payer: Medicare Other | Admitting: Podiatry

## 2018-06-02 VITALS — Temp 98.2°F

## 2018-06-02 DIAGNOSIS — L02619 Cutaneous abscess of unspecified foot: Secondary | ICD-10-CM | POA: Diagnosis not present

## 2018-06-02 DIAGNOSIS — L97522 Non-pressure chronic ulcer of other part of left foot with fat layer exposed: Secondary | ICD-10-CM

## 2018-06-02 DIAGNOSIS — G8220 Paraplegia, unspecified: Secondary | ICD-10-CM

## 2018-06-02 DIAGNOSIS — L03119 Cellulitis of unspecified part of limb: Secondary | ICD-10-CM

## 2018-06-02 MED ORDER — CEPHALEXIN 500 MG PO CAPS: 500.0000 mg | ORAL_CAPSULE | Freq: Three times a day (TID) | ORAL | 2 refills | Status: DC

## 2018-06-04 NOTE — Progress Notes (Signed)
Subjective: 42 year old male presents today with his wife possible infection, worsening of the wound to the outside of the left foot.  States he may have been on it too much but he was at the beach.  He does relate a small amount of increased swelling but denies any significant redness and denies any pus coming from the area or any red streaks.  He states he is gone back to his old brace. Denies any systemic complaints such as fevers, chills, nausea, vomiting. No acute changes since last appointment, and no other complaints at this time.   Objective: AAO x3, NAD DP/PT pulses palpable bilaterally, CRT less than 3 seconds On the lateral aspect the patient's level of metatarsal base with a hyperkeratotic lesion.  Upon debridement there was a granular wound present but there was no purulence.  Mild swelling to the area and there is faint erythema but there is no significant ascending cellulitis.  There is no fluctuation or crepitation or any malodor.  The wound is larger however there is no probing to bone there is no undermining or tunneling.  No open lesions or pre-ulcerative lesions.  No pain with calf compression, swelling, warmth, erythema  Assessment: Left foot chronic recurrent ulceration, early signs of infection given worsening of the wound and increase in swelling  Plan: -All treatment options discussed with the patient including all alternatives, risks, complications.  -Sharply debrided the ulcer today without any complication to reveal the ulcer is larger today. There is no pus however or significant erythema. However will start antibiotics. Prescribed Keflex today. Continue daily dressing change with a small amount of betadine. Offloading at all times. Ultimately we discussed I think the brace is causing the issues and pressure. He did heal the wound once and once he returned to his old brace the wound opened back up.  -Patient encouraged to call the office with any questions, concerns,  change in symptoms.   Trula Slade DPM

## 2018-06-10 ENCOUNTER — Encounter: Payer: Self-pay | Admitting: Podiatry

## 2018-06-10 ENCOUNTER — Ambulatory Visit: Payer: Medicare Other | Admitting: Podiatry

## 2018-06-10 DIAGNOSIS — L97522 Non-pressure chronic ulcer of other part of left foot with fat layer exposed: Secondary | ICD-10-CM | POA: Diagnosis not present

## 2018-06-12 NOTE — Progress Notes (Signed)
Subjective: 42 year old male presents the office today for evaluation of a wound to his left foot.  He has been keeping a bandage on the area daily.  He does believe the area has gotten better.  He denies any drainage or pus coming from the wound denies any increase in swelling or redness and actually he does feel it is improving. Denies any systemic complaints such as fevers, chills, nausea, vomiting. No acute changes since last appointment, and no other complaints at this time.   Objective: AAO x3, NAD DP/PT pulses palpable bilaterally, CRT less than 3 seconds On the lateral aspect of his left foot on the fifth metatarsal base continues to be ulceration however appears to be much improved.  Almost a fissure type wound measuring 2.5 x 0.1 cm with a granular wound base.  There is some peeling skin around the periphery of the wound from where the swelling has decreased.  Is no drainage or pus there is no stomach erythema, ascending cellulitis.  No fluctuation or crepitation or any malodor.  No other signs of infection noted today. No open lesions or pre-ulcerative lesions.  No pain with calf compression, swelling, warmth, erythema  Assessment: Healing ulceration left foot  Plan: -All treatment options discussed with the patient including all alternatives, risks, complications.  -I did sharply debride the wound utilizing #312 blade scalpel without any complications to healthy, granular wound base.  Switch to medihoney dressing changes daily I did give him some of this today.  Offloading at all times.  Also when he is wearing the brace which is been helpful. -Monitor for any clinical signs or symptoms of infection and directed to call the office immediately should any occur or go to the ER. -Patient encouraged to call the office with any questions, concerns, change in symptoms.   Trula Slade DPM

## 2018-06-19 ENCOUNTER — Ambulatory Visit: Payer: Medicare Other | Admitting: Podiatry

## 2018-06-30 ENCOUNTER — Ambulatory Visit: Payer: Medicare Other | Admitting: Podiatry

## 2018-06-30 ENCOUNTER — Encounter: Payer: Self-pay | Admitting: Podiatry

## 2018-06-30 DIAGNOSIS — L97522 Non-pressure chronic ulcer of other part of left foot with fat layer exposed: Secondary | ICD-10-CM | POA: Diagnosis not present

## 2018-06-30 MED ORDER — SILVER SULFADIAZINE 1 % EX CREA: 1.0000 "application " | TOPICAL_CREAM | Freq: Every day | CUTANEOUS | 0 refills | Status: DC

## 2018-07-01 NOTE — Progress Notes (Signed)
Subjective: 42 year old male presents the office today for evaluation of a wound to his left foot.  He states that the Medihoney was made in the wound during she went back to the Betadine.  He has had some bleeding coming from the rectum no pus.  No significant surrounding erythema or red streaks or any drainage or pus currently.  No pain.  He has no other concerns today.  Objective: AAO x3, NAD DP/PT pulses palpable bilaterally, CRT less than 3 seconds On the lateral aspect of his left foot on the fifth metatarsal base continues to be ulceration.  Appears that the skin is very dry with almost a fissure type wound.  Is a vertical type of incision with the superior portion.  It becomes more plantar going inferiorly starts to widen with a max with approximate 0.3 cm.  It is superficial with a granular wound base.  There is no swelling erythema, ascending cellulitis.  There is no fluctuation or crepitation or any malodor.  No other open lesions. No pain with calf compression, swelling, warmth, erythema  Assessment: Ulceration left foot  Plan: -All treatment options discussed with the patient including all alternatives, risks, complications.  -Today I sharply debrided the wound to the area skin with alcohol utilizing #312 blade scalpel without any complications down to healthy, granular tissue.  Drains were Silvadene which were prescribed today.  Daily dressing changes discussed.  Offloading all times.  He has shown that he can heal the wound as it was healed before.  We have to offload the area better. -Monitor for any clinical signs or symptoms of infection and directed to call the office immediately should any occur or go to the ER.  Trula Slade DPM

## 2018-07-14 ENCOUNTER — Ambulatory Visit: Payer: Medicare Other | Admitting: Podiatry

## 2018-07-14 DIAGNOSIS — L97522 Non-pressure chronic ulcer of other part of left foot with fat layer exposed: Secondary | ICD-10-CM

## 2018-07-16 NOTE — Progress Notes (Signed)
Subjective: 42 year old male presents the office today for evaluation of a wound to his left foot. He is been putting Silvadene on the area daily.  I think is getting better.  He again states that he thinks that the wound opened back up because after the wound closed originally went back to his old brace and it rubbed on the area.  Since going back to the new brace has not rubbing as much of the wound healing.  He denies any drainage or pus coming from the area he denies any swelling or redness currently.  Denies any fevers, chills, nausea, vomiting.  No coughing, chest pain, shortness of breath.  Objective: AAO x3, NAD DP/PT pulses palpable bilaterally, CRT less than 3 seconds On the lateral aspect of his left foot on the fifth metatarsal base continues to be ulceration however appears to be improving.  After debridement of the hyperkeratotic tissue that surrounds the area that measures 0.5 x 0.5 cm and has a depth approximately 0.1 cm with a grade no wound base.  There is no surrounding erythema, ascending cellulitis.  There is no fluctuation or crepitation or any malodor.  No other pre-ulcerative ulcers.  Peroneal tendon appears to be intact. No pain with calf compression, swelling, warmth, erythema  Assessment: Ulceration left foot, without signs of infection  Plan: -All treatment options discussed with the patient including all alternatives, risks, complications.  -Today I sharply debrided the wound to the area skin with alcohol utilizing #312 blade scalpel without any complications down to healthy, granular tissue.  We are to continue with Silvadene dressing changes daily.  I also had vascular evaluation for see if the brace or shoe is to be offloaded and well. -Monitor for any clinical signs or symptoms of infection and directed to call the office immediately should any occur or go to the ER.  Trula Slade DPM

## 2018-07-28 ENCOUNTER — Ambulatory Visit: Payer: Medicare Other | Admitting: Podiatry

## 2018-07-28 DIAGNOSIS — L97522 Non-pressure chronic ulcer of other part of left foot with fat layer exposed: Secondary | ICD-10-CM

## 2018-07-29 NOTE — Progress Notes (Signed)
Subjective: 42 year old male presents the office today for evaluation of a wound to his left foot.  He states that the area is getting better he denies any swelling or redness or any drainage or pus.  He has been putting Silvadene on the area daily.  Has been continue with new brace.  He has no other concerns today.  He presents with his mom.  Denies any fevers, chills, nausea, vomiting.  No coughing, chest pain, shortness of breath.  Objective: AAO x3, NAD DP/PT pulses palpable bilaterally, CRT less than 3 seconds On the lateral aspect of his left foot on the fifth metatarsal base continues to be ulceration however appears to be improving.  After debridement of the hyperkeratotic tissue that surrounds the area that measures 0.3 x 0.2 cm and has a depth approximately 0.1 cm with a grade no wound base.  There is no surrounding erythema, ascending cellulitis.  There is no fluctuation or crepitation or any malodor.  No other pre-ulcerative ulcers.  Peroneal tendon appears to be intact. No pain with calf compression, swelling, warmth, erythema  Assessment: Ulceration left foot, without signs of infection  Plan: -All treatment options discussed with the patient including all alternatives, risks, complications.  -Today I sharply debrided the wound to the area skin with alcohol utilizing #312 blade scalpel without any complications down to healthy, granular tissue.  We are to continue with Silvadene dressing changes daily.  -Monitor for any clinical signs or symptoms of infection and directed to call the office immediately should any occur or go to the ER.  Trula Slade DPM

## 2018-08-17 ENCOUNTER — Ambulatory Visit: Payer: Medicare Other | Admitting: Podiatry

## 2018-08-17 DIAGNOSIS — L97521 Non-pressure chronic ulcer of other part of left foot limited to breakdown of skin: Secondary | ICD-10-CM | POA: Diagnosis not present

## 2018-08-17 NOTE — Progress Notes (Signed)
Subjective: 42 year old male presents the office today for evaluation of a wound to his left foot.  He is continue with Silvadene on the wound daily.  He states that the area gets dry and cracked open illness some bleeding on the bandage.  Denies any pus, pain, swelling, redness.  He is currently on steroids as he recently had a radiation treatment and has had side effects.  He is decreasing the dose of the steroids and hopefully off of them shortly. Denies any fevers, chills, nausea, vomiting.  No coughing, chest pain, shortness of breath.  Objective: AAO x3, NAD- presents with his mother I do not completely different side today DP/PT pulses palpable bilaterally, CRT less than 3 seconds On the lateral aspect of his left foot on the fifth metatarsal base continues to be ulceration however appears to be improving.  After debridement of the hyperkeratotic tissue that surrounds the area that measures 0.3 x 0.2 cm and has a depth approximately 0.1 cm with a grade no wound base.  Overall the wound is about the same size but there is more hyperkeratotic tissue in the periphery of the wound.  There is no probing, undermining or tunneling.  There is no surrounding erythema, ascending cellulitis.  There is no fluctuation or crepitation or any malodor.  There is no clinical signs of infection noted today. No pain with calf compression, swelling, warmth, erythema  Assessment: Ulceration left foot, without signs of infection  Plan: -All treatment options discussed with the patient including all alternatives, risks, complications.  -Today I sharply debrided the wound to the area skin with alcohol utilizing #312 blade scalpel without any complications down to healthy, granular tissue.  Silver nitrate was applied.  We are to continue with Silvadene dressing changes daily.  Discussed moisturizer for the wound. -Monitor for any clinical signs or symptoms of infection and directed to call the office immediately should  any occur or go to the ER.  RTC 3 weeks or sooner if needed  Trula Slade DPM

## 2018-09-07 ENCOUNTER — Encounter: Payer: Self-pay | Admitting: Podiatry

## 2018-09-07 ENCOUNTER — Ambulatory Visit: Payer: Medicare Other | Admitting: Podiatry

## 2018-09-07 DIAGNOSIS — L97521 Non-pressure chronic ulcer of other part of left foot limited to breakdown of skin: Secondary | ICD-10-CM | POA: Diagnosis not present

## 2018-09-07 DIAGNOSIS — G8220 Paraplegia, unspecified: Secondary | ICD-10-CM

## 2018-09-09 NOTE — Progress Notes (Signed)
Subjective: 42 year old male presents the office today for evaluation of a wound to his left foot.  Overall he states he is doing well.  He has been keeping moisturizer on the wound daily.  Denies any drainage or pus and denies any increase in pain or any redness to his feet.  Denies any drainage or pus.  He has no other concerns today.  He has been continue with wearing his brace. Denies any fevers, chills, nausea, vomiting.  No coughing, chest pain, shortness of breath.  Objective: AAO x3, NAD- presents with his mother I do not completely different side today DP/PT pulses palpable bilaterally, CRT less than 3 seconds On the lateral aspect of his left foot on the fifth metatarsal base continues to be ulceration however appears to be improving.  After debridement of the hyperkeratotic tissue that surrounds the area that measures 0.2 x 0.2 x 0.1 cm.  The wound base is granular and there is surrounding hyperkeratotic tissue.  There is no probing, undermining or tunneling.  There is no surrounding erythema, ascending cellulitis.  There is no fluctuation or crepitation or any malodor. No pain with calf compression, swelling, warmth, erythema  Assessment: Chronic ulceration left foot, without signs of infection  Plan: -All treatment options discussed with the patient including all alternatives, risks, complications.  -Today I sharply debrided the wound to the area skin with alcohol utilizing #312 blade scalpel without any complications down to healthy, granular tissue.   We discussed dressing changes daily to be performed.  Silvadene was applied today. -Monitor for any clinical signs or symptoms of infection and directed to call the office immediately should any occur or go to the ER.  Return in about 2 weeks (around 09/21/2018).  Trula Slade DPM

## 2018-09-29 ENCOUNTER — Ambulatory Visit: Payer: Medicare Other | Admitting: Podiatry

## 2018-10-23 ENCOUNTER — Encounter: Payer: Self-pay | Admitting: Podiatry

## 2018-10-23 ENCOUNTER — Ambulatory Visit: Payer: Medicare Other | Admitting: Podiatry

## 2018-10-23 VITALS — BP 139/91 | HR 71 | Temp 97.7°F | Resp 16

## 2018-10-23 DIAGNOSIS — L97522 Non-pressure chronic ulcer of other part of left foot with fat layer exposed: Secondary | ICD-10-CM | POA: Diagnosis not present

## 2018-10-25 MED ORDER — DOXYCYCLINE HYCLATE 100 MG PO TABS: 100.0000 mg | ORAL_TABLET | Freq: Two times a day (BID) | ORAL | 0 refills | Status: DC

## 2018-10-25 NOTE — Progress Notes (Signed)
Subjective: 42 year old male presents the office today for evaluation of a wound to his left foot.  He was doing well but his mom states that there is still a "hole" present. He is concerned that the foot may be getting infected again.  He has not noticed any drainage or pus coming from the area.  Some minimal swelling and some minimal redness to the area but no red streaks.  No pain. Denies any fevers, chills, nausea, vomiting.  No coughing, chest pain, shortness of breath.  Objective: AAO x3, NAD- presents with his mother I do not completely different side today DP/PT pulses palpable bilaterally, CRT less than 3 seconds On the lateral aspect of his left foot on the fifth metatarsal base continues to be ulceration with hyperkeratotic periwound.  After debridement today the wound measures approximately 0.8 x 0.8 x 0.2 cm.  There is no probing to bone, undermining or tunneling.  There is very minimal surrounding edema and erythema but there is no drainage or pus there is no fluctuation or crepitation or any malodor. No pain with calf compression, swelling, warmth, erythema  Assessment: Chronic ulceration left foot  Plan: -All treatment options discussed with the patient including all alternatives, risks, complications.  -Today I sharply debrided the wound to the area skin with alcohol utilizing #312 blade scalpel without any complications down to healthy, granular tissue.  We will continue with Silvadene dressing changes daily and I applied this today. -Doxycycline -Given that the wound is been a chronic with a referred to the wound care center.  We did get the wound to heal at one point however given the pressure to the area and his gait instability this is inhibiting the wound from healing.  We have done brace modifications as well.  Trula Slade DPM

## 2018-10-26 ENCOUNTER — Telehealth: Payer: Self-pay | Admitting: *Deleted

## 2018-10-26 DIAGNOSIS — L97522 Non-pressure chronic ulcer of other part of left foot with fat layer exposed: Secondary | ICD-10-CM

## 2018-10-26 DIAGNOSIS — G8220 Paraplegia, unspecified: Secondary | ICD-10-CM

## 2018-10-26 NOTE — Telephone Encounter (Signed)
-----   Message from Trula Slade, DPM sent at 10/25/2018  9:34 AM EST ----- Can you please put in for a wound care appointment? Thanks.

## 2018-10-26 NOTE — Telephone Encounter (Signed)
Faxed required form, clinicals and demographics to [facility]. 

## 2018-10-27 ENCOUNTER — Encounter (HOSPITAL_BASED_OUTPATIENT_CLINIC_OR_DEPARTMENT_OTHER): Payer: Medicare Other | Attending: Internal Medicine

## 2018-10-27 DIAGNOSIS — Q8502 Neurofibromatosis, type 2: Secondary | ICD-10-CM | POA: Diagnosis not present

## 2018-10-27 DIAGNOSIS — G6289 Other specified polyneuropathies: Secondary | ICD-10-CM | POA: Diagnosis not present

## 2018-10-27 DIAGNOSIS — Z905 Acquired absence of kidney: Secondary | ICD-10-CM | POA: Insufficient documentation

## 2018-10-27 DIAGNOSIS — Z923 Personal history of irradiation: Secondary | ICD-10-CM | POA: Diagnosis not present

## 2018-10-27 DIAGNOSIS — R260 Ataxic gait: Secondary | ICD-10-CM | POA: Insufficient documentation

## 2018-10-27 DIAGNOSIS — L89892 Pressure ulcer of other site, stage 2: Secondary | ICD-10-CM | POA: Insufficient documentation

## 2018-11-03 ENCOUNTER — Ambulatory Visit: Payer: Medicare Other | Admitting: Podiatry

## 2018-11-03 DIAGNOSIS — L89892 Pressure ulcer of other site, stage 2: Secondary | ICD-10-CM | POA: Diagnosis not present

## 2018-11-09 DIAGNOSIS — L89892 Pressure ulcer of other site, stage 2: Secondary | ICD-10-CM | POA: Diagnosis not present

## 2018-11-17 ENCOUNTER — Other Ambulatory Visit (HOSPITAL_COMMUNITY)
Admission: RE | Admit: 2018-11-17 | Discharge: 2018-11-17 | Disposition: A | Discharge status: Home / Self Care | Payer: Medicare Other | Attending: Internal Medicine | Admitting: Internal Medicine

## 2018-11-17 ENCOUNTER — Other Ambulatory Visit (HOSPITAL_COMMUNITY): Payer: Self-pay | Admitting: Internal Medicine

## 2018-11-17 ENCOUNTER — Ambulatory Visit (HOSPITAL_COMMUNITY)
Admission: RE | Admit: 2018-11-17 | Discharge: 2018-11-17 | Disposition: A | Discharge status: Home / Self Care | Payer: Medicare Other | Source: Ambulatory Visit | Attending: Internal Medicine | Admitting: Internal Medicine

## 2018-11-17 DIAGNOSIS — L89892 Pressure ulcer of other site, stage 2: Secondary | ICD-10-CM | POA: Diagnosis not present

## 2018-11-17 DIAGNOSIS — L97529 Non-pressure chronic ulcer of other part of left foot with unspecified severity: Secondary | ICD-10-CM | POA: Insufficient documentation

## 2018-11-19 LAB — AEROBIC CULTURE W GRAM STAIN (SUPERFICIAL SPECIMEN)

## 2018-11-24 ENCOUNTER — Encounter (HOSPITAL_BASED_OUTPATIENT_CLINIC_OR_DEPARTMENT_OTHER): Payer: Medicare Other | Attending: Internal Medicine

## 2018-11-24 DIAGNOSIS — G6289 Other specified polyneuropathies: Secondary | ICD-10-CM | POA: Diagnosis not present

## 2018-11-24 DIAGNOSIS — Q8502 Neurofibromatosis, type 2: Secondary | ICD-10-CM | POA: Insufficient documentation

## 2018-11-24 DIAGNOSIS — R260 Ataxic gait: Secondary | ICD-10-CM | POA: Diagnosis not present

## 2018-11-24 DIAGNOSIS — B951 Streptococcus, group B, as the cause of diseases classified elsewhere: Secondary | ICD-10-CM | POA: Diagnosis not present

## 2018-11-24 DIAGNOSIS — R21 Rash and other nonspecific skin eruption: Secondary | ICD-10-CM | POA: Insufficient documentation

## 2018-11-24 DIAGNOSIS — Z923 Personal history of irradiation: Secondary | ICD-10-CM | POA: Insufficient documentation

## 2018-11-24 DIAGNOSIS — L97822 Non-pressure chronic ulcer of other part of left lower leg with fat layer exposed: Secondary | ICD-10-CM | POA: Diagnosis present

## 2018-12-04 DIAGNOSIS — L97822 Non-pressure chronic ulcer of other part of left lower leg with fat layer exposed: Secondary | ICD-10-CM | POA: Diagnosis not present

## 2018-12-11 ENCOUNTER — Other Ambulatory Visit (HOSPITAL_COMMUNITY): Payer: Self-pay | Admitting: Internal Medicine

## 2018-12-11 ENCOUNTER — Other Ambulatory Visit: Payer: Self-pay | Admitting: Internal Medicine

## 2018-12-11 DIAGNOSIS — L97822 Non-pressure chronic ulcer of other part of left lower leg with fat layer exposed: Secondary | ICD-10-CM | POA: Diagnosis not present

## 2018-12-11 DIAGNOSIS — L97521 Non-pressure chronic ulcer of other part of left foot limited to breakdown of skin: Secondary | ICD-10-CM

## 2018-12-14 ENCOUNTER — Ambulatory Visit (HOSPITAL_COMMUNITY)
Admission: RE | Admit: 2018-12-14 | Discharge: 2018-12-14 | Disposition: A | Discharge status: Home / Self Care | Payer: Medicare Other | Source: Ambulatory Visit | Attending: Internal Medicine | Admitting: Internal Medicine

## 2018-12-14 DIAGNOSIS — L97521 Non-pressure chronic ulcer of other part of left foot limited to breakdown of skin: Secondary | ICD-10-CM | POA: Diagnosis not present

## 2018-12-14 MED ORDER — GADOBUTROL 1 MMOL/ML IV SOLN
7.0000 mL | Freq: Once | INTRAVENOUS | Status: AC | PRN
  Administered 2018-12-14: 7 mL via INTRAVENOUS

## 2018-12-18 ENCOUNTER — Ambulatory Visit (HOSPITAL_COMMUNITY): Payer: Medicare Other

## 2018-12-18 DIAGNOSIS — L97822 Non-pressure chronic ulcer of other part of left lower leg with fat layer exposed: Secondary | ICD-10-CM | POA: Diagnosis not present

## 2018-12-29 ENCOUNTER — Encounter (HOSPITAL_BASED_OUTPATIENT_CLINIC_OR_DEPARTMENT_OTHER): Payer: Medicare Other | Attending: Internal Medicine

## 2018-12-29 DIAGNOSIS — R260 Ataxic gait: Secondary | ICD-10-CM | POA: Diagnosis not present

## 2018-12-29 DIAGNOSIS — Q8502 Neurofibromatosis, type 2: Secondary | ICD-10-CM | POA: Diagnosis not present

## 2018-12-29 DIAGNOSIS — L97522 Non-pressure chronic ulcer of other part of left foot with fat layer exposed: Secondary | ICD-10-CM | POA: Diagnosis not present

## 2018-12-29 DIAGNOSIS — Z923 Personal history of irradiation: Secondary | ICD-10-CM | POA: Diagnosis not present

## 2018-12-29 DIAGNOSIS — G6289 Other specified polyneuropathies: Secondary | ICD-10-CM | POA: Diagnosis not present

## 2018-12-29 DIAGNOSIS — L84 Corns and callosities: Secondary | ICD-10-CM | POA: Diagnosis not present

## 2019-01-05 DIAGNOSIS — L97522 Non-pressure chronic ulcer of other part of left foot with fat layer exposed: Secondary | ICD-10-CM | POA: Diagnosis not present

## 2019-01-11 DIAGNOSIS — L97522 Non-pressure chronic ulcer of other part of left foot with fat layer exposed: Secondary | ICD-10-CM | POA: Diagnosis not present

## 2019-01-18 ENCOUNTER — Encounter (HOSPITAL_BASED_OUTPATIENT_CLINIC_OR_DEPARTMENT_OTHER): Payer: Medicare Other | Attending: Internal Medicine

## 2019-01-18 DIAGNOSIS — B964 Proteus (mirabilis) (morganii) as the cause of diseases classified elsewhere: Secondary | ICD-10-CM | POA: Insufficient documentation

## 2019-01-18 DIAGNOSIS — L97822 Non-pressure chronic ulcer of other part of left lower leg with fat layer exposed: Secondary | ICD-10-CM | POA: Diagnosis present

## 2019-01-18 DIAGNOSIS — Z923 Personal history of irradiation: Secondary | ICD-10-CM | POA: Insufficient documentation

## 2019-01-18 DIAGNOSIS — Q8502 Neurofibromatosis, type 2: Secondary | ICD-10-CM | POA: Diagnosis not present

## 2019-01-26 ENCOUNTER — Other Ambulatory Visit (HOSPITAL_COMMUNITY)
Admission: RE | Admit: 2019-01-26 | Discharge: 2019-01-26 | Disposition: A | Discharge status: Home / Self Care | Payer: Medicare Other | Source: Other Acute Inpatient Hospital | Attending: Internal Medicine | Admitting: Internal Medicine

## 2019-01-26 DIAGNOSIS — S91109A Unspecified open wound of unspecified toe(s) without damage to nail, initial encounter: Secondary | ICD-10-CM | POA: Insufficient documentation

## 2019-01-26 DIAGNOSIS — L97822 Non-pressure chronic ulcer of other part of left lower leg with fat layer exposed: Secondary | ICD-10-CM | POA: Diagnosis not present

## 2019-01-29 LAB — AEROBIC CULTURE  (SUPERFICIAL SPECIMEN)

## 2019-01-29 LAB — AEROBIC CULTURE W GRAM STAIN (SUPERFICIAL SPECIMEN): Gram Stain: NONE SEEN

## 2019-02-02 DIAGNOSIS — L97822 Non-pressure chronic ulcer of other part of left lower leg with fat layer exposed: Secondary | ICD-10-CM | POA: Diagnosis not present

## 2019-02-11 DIAGNOSIS — L97822 Non-pressure chronic ulcer of other part of left lower leg with fat layer exposed: Secondary | ICD-10-CM | POA: Diagnosis not present

## 2019-02-18 ENCOUNTER — Encounter (HOSPITAL_BASED_OUTPATIENT_CLINIC_OR_DEPARTMENT_OTHER): Payer: Medicare Other | Attending: Internal Medicine

## 2019-02-18 ENCOUNTER — Other Ambulatory Visit: Payer: Self-pay

## 2019-02-18 DIAGNOSIS — L97522 Non-pressure chronic ulcer of other part of left foot with fat layer exposed: Secondary | ICD-10-CM | POA: Diagnosis present

## 2019-02-18 DIAGNOSIS — Z923 Personal history of irradiation: Secondary | ICD-10-CM | POA: Diagnosis not present

## 2019-02-18 DIAGNOSIS — Q8502 Neurofibromatosis, type 2: Secondary | ICD-10-CM | POA: Insufficient documentation

## 2019-03-04 DIAGNOSIS — L97522 Non-pressure chronic ulcer of other part of left foot with fat layer exposed: Secondary | ICD-10-CM | POA: Diagnosis not present

## 2019-03-18 ENCOUNTER — Encounter: Payer: Self-pay | Admitting: Podiatry

## 2019-03-18 DIAGNOSIS — L97522 Non-pressure chronic ulcer of other part of left foot with fat layer exposed: Secondary | ICD-10-CM | POA: Diagnosis not present

## 2019-03-25 ENCOUNTER — Encounter (HOSPITAL_BASED_OUTPATIENT_CLINIC_OR_DEPARTMENT_OTHER): Payer: Medicare Other | Attending: Internal Medicine

## 2019-03-25 DIAGNOSIS — Z923 Personal history of irradiation: Secondary | ICD-10-CM | POA: Diagnosis not present

## 2019-03-25 DIAGNOSIS — L97422 Non-pressure chronic ulcer of left heel and midfoot with fat layer exposed: Secondary | ICD-10-CM | POA: Diagnosis not present

## 2019-04-08 DIAGNOSIS — L97422 Non-pressure chronic ulcer of left heel and midfoot with fat layer exposed: Secondary | ICD-10-CM | POA: Diagnosis not present

## 2019-04-13 DIAGNOSIS — L97422 Non-pressure chronic ulcer of left heel and midfoot with fat layer exposed: Secondary | ICD-10-CM | POA: Diagnosis not present

## 2019-04-27 ENCOUNTER — Other Ambulatory Visit: Payer: Self-pay

## 2019-04-27 ENCOUNTER — Encounter (HOSPITAL_BASED_OUTPATIENT_CLINIC_OR_DEPARTMENT_OTHER): Payer: Medicare Other | Attending: Internal Medicine

## 2019-04-27 DIAGNOSIS — Z09 Encounter for follow-up examination after completed treatment for conditions other than malignant neoplasm: Secondary | ICD-10-CM | POA: Insufficient documentation

## 2019-04-27 DIAGNOSIS — Z872 Personal history of diseases of the skin and subcutaneous tissue: Secondary | ICD-10-CM | POA: Insufficient documentation

## 2019-04-27 DIAGNOSIS — Z923 Personal history of irradiation: Secondary | ICD-10-CM | POA: Diagnosis not present

## 2019-05-04 ENCOUNTER — Ambulatory Visit: Payer: Medicare Other | Admitting: Podiatry

## 2019-05-04 ENCOUNTER — Other Ambulatory Visit: Payer: Self-pay

## 2019-05-04 ENCOUNTER — Encounter: Payer: Self-pay | Admitting: Podiatry

## 2019-05-04 DIAGNOSIS — Z872 Personal history of diseases of the skin and subcutaneous tissue: Secondary | ICD-10-CM

## 2019-05-04 DIAGNOSIS — S99922A Unspecified injury of left foot, initial encounter: Secondary | ICD-10-CM | POA: Diagnosis not present

## 2019-05-04 DIAGNOSIS — M21372 Foot drop, left foot: Secondary | ICD-10-CM

## 2019-05-04 DIAGNOSIS — M21371 Foot drop, right foot: Secondary | ICD-10-CM

## 2019-05-04 DIAGNOSIS — Q8502 Neurofibromatosis, type 2: Secondary | ICD-10-CM

## 2019-05-04 DIAGNOSIS — H9071 Mixed conductive and sensorineural hearing loss, unilateral, right ear, with unrestricted hearing on the contralateral side: Secondary | ICD-10-CM | POA: Insufficient documentation

## 2019-05-04 NOTE — Progress Notes (Signed)
Brace consult: the wedge/buttress and OTS brace seems to be working fine.  I did give patient diabetic insert and offloaded with dancers pad and k-wedge to take pressure of site of previous ulcer.  Nadara Mustard to get appointment with Dr. Reece Agar in three weeks to make sure no skin irritation or breakdown.   Discussed possibly sending Lenward to [employer] for a custom metal AFO w/ valgus t-strap if there are any issues/concerns about further ulceration.    Burna Atlas.

## 2019-05-10 NOTE — Progress Notes (Signed)
Subjective: 43 year old male presents the office today to discuss his brace.  The wound in the left foot finally healed however he was referred back to me for evaluation of the brace and possible new bracing to help prevent reoccurrence of the wound.   Denies any systemic complaints such as fevers, chills, nausea, vomiting. No acute changes since last appointment, and no other complaints at this time.   Objective: AAO x3, NAD DP/PT pulses palpable bilaterally, CRT less than 3 seconds The area the previous wound on the left foot is healed.  There is no other open lesions. The third digit toenail has some blood around the area of the nails loosen the underlying nail bed distally only get on the proximal aspect.  There is no edema, erythema there is no clinical signs of infection.  No open lesions or pre-ulcerative lesions.  No pain with calf compression, swelling, warmth, erythema  Assessment: Healed ulceration left foot; toenail injury/onycholysis  Plan: -All treatment options discussed with the patient including all alternatives, risks, complications.  -I had Rick evaluate the brace and he did modify the brace to help for the pain. -Debrided the nail without any complications.  Recommend antibiotic ointment dressing changes daily.  Monitor for any signs or symptoms of infection. -Patient encouraged to call the office with any questions, concerns, change in symptoms.   Trula Slade DPM

## 2019-06-02 ENCOUNTER — Ambulatory Visit: Payer: Medicare Other | Admitting: Podiatry

## 2019-06-03 ENCOUNTER — Encounter: Payer: Self-pay | Admitting: Podiatry

## 2019-06-03 ENCOUNTER — Other Ambulatory Visit: Payer: Self-pay

## 2019-06-03 ENCOUNTER — Ambulatory Visit: Payer: Medicare Other | Admitting: Podiatry

## 2019-06-03 VITALS — Temp 98.3°F

## 2019-06-03 DIAGNOSIS — H9041 Sensorineural hearing loss, unilateral, right ear, with unrestricted hearing on the contralateral side: Secondary | ICD-10-CM | POA: Insufficient documentation

## 2019-06-03 DIAGNOSIS — G8220 Paraplegia, unspecified: Secondary | ICD-10-CM

## 2019-06-03 DIAGNOSIS — H9311 Tinnitus, right ear: Secondary | ICD-10-CM | POA: Insufficient documentation

## 2019-06-03 DIAGNOSIS — Z872 Personal history of diseases of the skin and subcutaneous tissue: Secondary | ICD-10-CM

## 2019-06-14 NOTE — Progress Notes (Signed)
Subjective: 43 year old male presents the office with his mother for follow-up evaluation of the brace.  He feels that the brace is not rubbing and is doing well.  Denies any new areas of skin irritation..  The wound continues to do well. Denies any systemic complaints such as fevers, chills, nausea, vomiting. No acute changes since last appointment, and no other complaints at this time.   Objective: AAO x3, NAD DP/PT pulses palpable bilaterally, CRT less than 3 seconds Hyperkeratotic lesion on the area the previous wound on left foot I asked the left foot.  Some dried blood is present on the callus and upon debridement area is pre-ulcerative there is no open lesion identified at this time.  There is no edema, erythema, drainage or pus or any clinical signs of infection noted today.  No other open lesions or pre-ulcerative lesions are identified. No pain with calf compression, swelling, warmth, erythema  Assessment: Pre-ulcerative lesion  Plan: -All treatment options discussed with the patient including all alternatives, risks, complications.  -I debrided the hyperkeratotic tissue to the any complications of bleeding minimally.  Continue with the brace.  At this time since it has been modified it is not causing any irritation neck appreciate today.  If there is any issues she will let us know. -Patient encouraged to call the office with any questions, concerns, change in symptoms.   Trula Slade DPM

## 2019-07-08 ENCOUNTER — Ambulatory Visit: Payer: Medicare Other | Admitting: Podiatry

## 2019-07-08 ENCOUNTER — Encounter: Payer: Self-pay | Admitting: Podiatry

## 2019-07-08 ENCOUNTER — Other Ambulatory Visit: Payer: Self-pay

## 2019-07-08 VITALS — Temp 98.3°F

## 2019-07-08 DIAGNOSIS — Q8502 Neurofibromatosis, type 2: Secondary | ICD-10-CM

## 2019-07-08 DIAGNOSIS — L97521 Non-pressure chronic ulcer of other part of left foot limited to breakdown of skin: Secondary | ICD-10-CM

## 2019-07-08 DIAGNOSIS — R262 Difficulty in walking, not elsewhere classified: Secondary | ICD-10-CM

## 2019-07-09 ENCOUNTER — Ambulatory Visit: Payer: Medicare Other | Admitting: Podiatry

## 2019-07-14 NOTE — Progress Notes (Signed)
Subjective: 43 year old male presents the office with his mother for follow-up evaluation of the brace as well as for the ulcer.  He thinks that this wound is starting to open back up.  Denies any drainage of pus and denies any surrounding redness or swelling or any  drainage or pus.  No red streaking.  Denies any fevers, chills, nausea, vomiting.  No calf pain, chest pain, shortness of breath.    Objective: AAO x3, NAD DP/PT pulses palpable bilaterally, CRT less than 3 seconds Hyperkeratotic lesion on the area the previous wound on left foot.  However upon debridement there is a recurrence of wound present.  Measures 0.5 x 0.3 x 0.2 cm.  There is no probing, undermining or tunneling.  No surrounding erythema, ascending cellulitis.  There is no fluctuation crepitation any malodor.  No pain with calf compression, swelling, warmth, erythema  Assessment: Recurrent ulceration left foot  Plan: -All treatment options discussed with the patient including all alternatives, risks, complications.  -Debrided the hyperkeratotic tissue in the wound today utilizing #312 with scalpel without any complications.  Greatest healthy tissue.  Today applied medihoney to the wound.  He has collagen dressing at home around him to use.  Clean the wound with saline and apply the dressing. -Discussed changing shoes.  Hopefully if the brace is causing the issue I think he needs more of a custom shoe.  We will see him back next Friday and I will see him for the wound and also Betha for the shoe.  -Monitor for any clinical signs or symptoms of infection and directed to call the office immediately should any occur or go to the ER.  No follow-ups on file.  Trula Slade DPM

## 2019-07-16 ENCOUNTER — Encounter: Payer: Self-pay | Admitting: Podiatry

## 2019-07-16 ENCOUNTER — Ambulatory Visit: Payer: Medicare Other | Admitting: Podiatry

## 2019-07-16 ENCOUNTER — Other Ambulatory Visit: Payer: Medicare Other | Admitting: Orthotics

## 2019-07-16 ENCOUNTER — Other Ambulatory Visit: Payer: Self-pay

## 2019-07-16 DIAGNOSIS — L97521 Non-pressure chronic ulcer of other part of left foot limited to breakdown of skin: Secondary | ICD-10-CM

## 2019-07-16 DIAGNOSIS — R262 Difficulty in walking, not elsewhere classified: Secondary | ICD-10-CM | POA: Diagnosis not present

## 2019-07-16 DIAGNOSIS — Q8502 Neurofibromatosis, type 2: Secondary | ICD-10-CM

## 2019-07-19 ENCOUNTER — Telehealth: Payer: Self-pay | Admitting: Podiatry

## 2019-07-19 NOTE — Telephone Encounter (Signed)
Called pt after contacting insurance and pt has met out of pocket max so we are going to try and bill insurance even though pt is not diabetic. If they don't cover we will charge 150.00.Pt is aware and is wanting to proceed.

## 2019-07-19 NOTE — Progress Notes (Signed)
Subjective: 43 year old male presents the office with his mom for follow-up evaluation of a wound on the left foot.  He has continued with collagen silver dressing changes.  Denies any drainage or pus.  Denies any swelling or redness or any drainage or pus. Denies any systemic complaints such as fevers, chills, nausea, vomiting. No acute changes since last appointment, and no other complaints at this time.   Objective: AAO x3, NAD DP/PT pulses palpable bilaterally, CRT less than 3 seconds On the left foot fifth metatarsal base is a hyperkeratotic lesion with a central line.  To the wound is smaller measuring possibly 0.3 x 0.2 x 0.2 cm.  There is no probing, undermining or tunneling.  No surrounding erythema, ascending cellulitis.  No fluctuance regurgitation any malodor. No open lesions or pre-ulcerative lesions.  No pain with calf compression, swelling, warmth, erythema  Assessment: Ulceration left foot  Plan: -All treatment options discussed with the patient including all alternatives, risks, complications.  -Debrided the wound to a lesser #312 with scalpel to any complications.  Continue daily dressing changes with collagen/silver. Offloading -I also had Betha evaluate him today.  The brace appears to be fitting but needs new shoes.  It seems that shoes are causing excess pressure.  New shoes were ordered today. -Monitor for any clinical signs or symptoms of infection and directed to call the office immediately should any occur or go to the ER. -Patient encouraged to call the office with any questions, concerns, change in symptoms.    Return in about 10 days (around 07/26/2019).  Wound check  Trula Slade DPM

## 2019-07-29 ENCOUNTER — Encounter: Payer: Self-pay | Admitting: Podiatry

## 2019-07-29 ENCOUNTER — Ambulatory Visit (INDEPENDENT_AMBULATORY_CARE_PROVIDER_SITE_OTHER): Payer: Medicare Other | Admitting: Podiatry

## 2019-07-29 ENCOUNTER — Other Ambulatory Visit: Payer: Self-pay

## 2019-07-29 DIAGNOSIS — L97521 Non-pressure chronic ulcer of other part of left foot limited to breakdown of skin: Secondary | ICD-10-CM | POA: Diagnosis not present

## 2019-07-29 NOTE — Progress Notes (Signed)
Subjective: 43 year old male presents the office with his mom for follow-up evaluation of a wound on the left foot.  He thinks the wound is getting better. Denies any drainage or pus. No swelling, redness.  Denies any fevers, chills, nausea, vomiting.  No other concerns today.  Awaiting new shoes.  Objective: AAO x3, NAD DP/PT pulses palpable bilaterally, CRT less than 3 seconds On the left foot fifth metatarsal base is a hyperkeratotic lesion.  Upon debridement the lesion today appears to be almost healed.  Small pinpoint opening still present and superficial.  Is no pitting, undermining or tunneling.  No swelling erythema, ascending cellulitis.  No fluctuation or crepitation or any malodor. No open lesions or pre-ulcerative lesions.  No pain with calf compression, swelling, warmth, erythema  Assessment: Ulceration left foot, improving  Plan: -All treatment options discussed with the patient including all alternatives, risks, complications.  -Debrided the wound to a lesser #312 with scalpel to any complications.  Continue daily dressing changes with collagen/silver. Offloading.  Overall wound is doing much better.  We are awaiting new shoes. -Monitor for any clinical signs or symptoms of infection and directed to call the office immediately should any occur or go to the ER.  No follow-ups on file.  I will see him back when that she is coming most hopefully the next 1 to 2 weeks.  Trula Slade DPM

## 2019-08-05 ENCOUNTER — Ambulatory Visit: Payer: Medicare Other | Admitting: Podiatry

## 2019-08-18 ENCOUNTER — Other Ambulatory Visit: Payer: Self-pay

## 2019-08-18 ENCOUNTER — Ambulatory Visit: Payer: Medicare Other | Admitting: Orthotics

## 2019-08-18 DIAGNOSIS — L97521 Non-pressure chronic ulcer of other part of left foot limited to breakdown of skin: Secondary | ICD-10-CM

## 2019-08-18 DIAGNOSIS — L97522 Non-pressure chronic ulcer of other part of left foot with fat layer exposed: Secondary | ICD-10-CM

## 2019-08-18 DIAGNOSIS — Z872 Personal history of diseases of the skin and subcutaneous tissue: Secondary | ICD-10-CM

## 2019-08-18 DIAGNOSIS — R262 Difficulty in walking, not elsewhere classified: Secondary | ICD-10-CM

## 2019-08-18 DIAGNOSIS — M21371 Foot drop, right foot: Secondary | ICD-10-CM

## 2019-08-18 DIAGNOSIS — Q8502 Neurofibromatosis, type 2: Secondary | ICD-10-CM

## 2019-08-18 NOTE — Progress Notes (Signed)
Reordering a shoe with deeper heel seat.

## 2019-08-23 ENCOUNTER — Other Ambulatory Visit: Payer: Self-pay

## 2019-08-23 ENCOUNTER — Ambulatory Visit: Payer: Medicare Other | Admitting: Orthotics

## 2019-08-23 DIAGNOSIS — R262 Difficulty in walking, not elsewhere classified: Secondary | ICD-10-CM

## 2019-08-23 DIAGNOSIS — Q8502 Neurofibromatosis, type 2: Secondary | ICD-10-CM

## 2019-08-23 DIAGNOSIS — L97521 Non-pressure chronic ulcer of other part of left foot limited to breakdown of skin: Secondary | ICD-10-CM

## 2019-08-23 DIAGNOSIS — Z872 Personal history of diseases of the skin and subcutaneous tissue: Secondary | ICD-10-CM

## 2019-08-23 NOTE — Progress Notes (Signed)
Patient picked up shoes/ NB fit well.

## 2019-09-21 ENCOUNTER — Other Ambulatory Visit: Payer: Self-pay

## 2019-09-21 ENCOUNTER — Ambulatory Visit: Payer: Medicare Other | Admitting: Podiatry

## 2019-09-21 DIAGNOSIS — L97521 Non-pressure chronic ulcer of other part of left foot limited to breakdown of skin: Secondary | ICD-10-CM | POA: Diagnosis not present

## 2019-09-21 DIAGNOSIS — Q8502 Neurofibromatosis, type 2: Secondary | ICD-10-CM

## 2019-09-21 DIAGNOSIS — R262 Difficulty in walking, not elsewhere classified: Secondary | ICD-10-CM | POA: Diagnosis not present

## 2019-09-21 DIAGNOSIS — M21371 Foot drop, right foot: Secondary | ICD-10-CM

## 2019-09-21 DIAGNOSIS — M21372 Foot drop, left foot: Secondary | ICD-10-CM

## 2019-09-21 NOTE — Progress Notes (Signed)
Subjective: 43 year old male presents the office with his mom for follow-up evaluation of a wound on the left foot.  Patient the foot is been doing well and they have not been keeping a bandage on it because has not been draining at this point no opening.  He was previously having some discomfort but that resolved.  No swelling or redness.  He did get new shoes.  No red streaks.  Denies any fevers, chills, nausea, vomiting.  Denies any calf pain, chest pain, shortness of breath.  Objective: AAO x3, NAD DP/PT pulses palpable bilaterally, CRT less than 3 seconds On the left foot fifth metatarsal base is a hyperkeratotic lesion.  Upon debridement there is no ongoing ulceration with area is preulcerative.  There is no edema no erythema there is no drainage or pus there is no fluctuation or crepitation.  There is no malodor. No open lesions or pre-ulcerative lesions.  No pain with calf compression, swelling, warmth, erythema  Assessment: Ulceration left foot, improving  Plan: -All treatment options discussed with the patient including all alternatives, risks, complications.  -Debrided the hyperkeratotic nature with hyperkeratotic scalpel and complications or bleeding.  Recommend moisturizer daily as well as padding.  Continue the brace and shoe. Appears to be fitting well.  -Monitor for any clinical signs or symptoms of infection and directed to call the office immediately should any occur or go to the ER.  Return in about 4 weeks (around 10/19/2019).  Trula Slade DPM

## 2019-10-19 ENCOUNTER — Other Ambulatory Visit: Payer: Self-pay

## 2019-10-19 ENCOUNTER — Ambulatory Visit: Payer: Medicare Other | Admitting: Podiatry

## 2019-10-19 DIAGNOSIS — Q8502 Neurofibromatosis, type 2: Secondary | ICD-10-CM | POA: Diagnosis not present

## 2019-10-19 DIAGNOSIS — L97521 Non-pressure chronic ulcer of other part of left foot limited to breakdown of skin: Secondary | ICD-10-CM | POA: Diagnosis not present

## 2019-10-19 DIAGNOSIS — L02619 Cutaneous abscess of unspecified foot: Secondary | ICD-10-CM | POA: Diagnosis not present

## 2019-10-19 DIAGNOSIS — L03119 Cellulitis of unspecified part of limb: Secondary | ICD-10-CM

## 2019-10-19 MED ORDER — DOXYCYCLINE HYCLATE 100 MG PO TABS: 100.0000 mg | ORAL_TABLET | Freq: Two times a day (BID) | ORAL | 0 refills | Status: DC

## 2019-10-26 NOTE — Progress Notes (Signed)
Subjective: 42 year old male presents the office with his mom for follow-up evaluation of a wound on the left foot.  His mom is concerned because the area will heal but then it comes back.  They are asking for possible inserts with his shoes.  He has changed braces as well as we have done new shoes.  There is been some bloody drainage but denies any purulence.  Patient with some minimal redness but there is no red streaks.  Denies any fevers, chills, nausea, vomiting.  No calf pain, chest pain, shortness of breath.  No other concerns today.  Objective: AAO x3, NAD DP/PT pulses palpable bilaterally, CRT less than 3 seconds On the left foot fifth metatarsal base is a hyperkeratotic lesion.  Upon debridement there is a granular wound present today.  There is no drainage or pus identified but there is some mild surrounding erythema there is no ascending cellulitis.  Minimal edema around the wound.  There is no fluctuation crepitation.  There is no malodor. No open lesions or pre-ulcerative lesions.  No pain with calf compression, swelling, warmth, erythema  Assessment: Ulceration left foot, with mild erythema  Plan: -All treatment options discussed with the patient including all alternatives, risks, complications.  -Debrided the hyperkeratotic and ulceration utilizing the 312 with scalpel that any complications down to healthy, granular tissue.  Prescribe doxycycline given the mild erythema.  Mupirocin ointment dressing changes for now.  I had Liliane Channel evaluate him today he did make an insert to wear inside of his shoe to help offload the wound. -Monitor for any clinical signs or symptoms of infection and directed to call the office immediately should any occur or go to the ER.  Return in about 1 week (around 10/26/2019).  Trula Slade DPM

## 2019-10-28 ENCOUNTER — Encounter: Payer: Self-pay | Admitting: Podiatry

## 2019-10-28 ENCOUNTER — Other Ambulatory Visit: Payer: Self-pay

## 2019-10-28 ENCOUNTER — Ambulatory Visit: Payer: Medicare Other | Admitting: Podiatry

## 2019-10-28 DIAGNOSIS — L97521 Non-pressure chronic ulcer of other part of left foot limited to breakdown of skin: Secondary | ICD-10-CM | POA: Diagnosis not present

## 2019-10-28 DIAGNOSIS — Q8502 Neurofibromatosis, type 2: Secondary | ICD-10-CM

## 2019-11-04 ENCOUNTER — Telehealth: Payer: Self-pay | Admitting: *Deleted

## 2019-11-04 MED ORDER — DOXYCYCLINE HYCLATE 100 MG PO TABS: 100.0000 mg | ORAL_TABLET | Freq: Two times a day (BID) | ORAL | 0 refills | Status: DC

## 2019-11-04 NOTE — Telephone Encounter (Signed)
I spoke with pt and he denies increased redness, swelling, fever or chills. I told pt that I would refill the doxycycline and if he should have any of the symptoms I described and he denied he should go to the ED and keep his appt 11/15/2019.

## 2019-11-04 NOTE — Telephone Encounter (Signed)
Pt states he would like a refill of the antibiotic, he was redressing his wound and it has a yellowish foul smelling drainage.

## 2019-11-08 NOTE — Progress Notes (Signed)
Subjective: 43 year old male presents the office with his mom for follow-up evaluation of a wound on the left foot.  He thinks the wound is doing about the same.  Is been the doxycycline.  Denies any drainage or pus.  No increase in swelling or redness.  No pain. Denies any fevers, chills, nausea, vomiting.  No calf pain, chest pain, shortness of breath.  No other concerns today.  Objective: AAO x3, NAD DP/PT pulses palpable bilaterally, CRT less than 3 seconds On the left foot fifth metatarsal base is a hyperkeratotic lesion with central ulceration.  The wound itself is about the same size however the surrounding erythema has resolved.  There is no fluctuation crepitation.  There is no malodor.  No significant drainage identified today.  No tenderness palpation.  There is no probing, undermining or tunneling. No open lesions or pre-ulcerative lesions.  No pain with calf compression, swelling, warmth, erythema  Assessment: Ulceration left foot, with resolved erythema  Plan: -All treatment options discussed with the patient including all alternatives, risks, complications.  -Debrided the hyperkeratotic and ulceration utilizing the 312 with scalpel that any complications down to healthy, granular tissue.  Erythema has resolved and no clinical signs of infection. Actisorb dressing changes daily. Also had Rick further modify the brace as it does appear it is still causing irritation.  -Monitor for any clinical signs or symptoms of infection and directed to call the office immediately should any occur or go to the ER.  Return in about 2 weeks (around 11/11/2019).   Trula Slade DPM

## 2019-11-15 ENCOUNTER — Ambulatory Visit: Payer: Medicare Other | Admitting: Podiatry

## 2019-11-16 ENCOUNTER — Other Ambulatory Visit: Payer: Self-pay

## 2019-11-16 ENCOUNTER — Encounter: Payer: Self-pay | Admitting: Podiatry

## 2019-11-16 ENCOUNTER — Ambulatory Visit: Payer: Medicare Other | Admitting: Podiatry

## 2019-11-16 VITALS — Temp 96.9°F

## 2019-11-16 DIAGNOSIS — L97521 Non-pressure chronic ulcer of other part of left foot limited to breakdown of skin: Secondary | ICD-10-CM | POA: Diagnosis not present

## 2019-11-16 DIAGNOSIS — L03119 Cellulitis of unspecified part of limb: Secondary | ICD-10-CM | POA: Diagnosis not present

## 2019-11-16 DIAGNOSIS — Q8502 Neurofibromatosis, type 2: Secondary | ICD-10-CM | POA: Diagnosis not present

## 2019-11-16 DIAGNOSIS — L02619 Cutaneous abscess of unspecified foot: Secondary | ICD-10-CM

## 2019-11-16 NOTE — Progress Notes (Signed)
Subjective: 43 year old male presents the office today for Evaluation of a sore on the left foot.  He did call the other week and he was having increased odor but no redness and he was started on doxycycline.  It was causing upset stomach so he stopped the medication but he went back on it and taking a probiotic and he is no longer any stomach issues.  He has been keeping Iodosorb on the wound daily. Denies any systemic complaints such as fevers, chills, nausea, vomiting. No acute changes since last appointment, and no other complaints at this time.   Objective: AAO x3, NAD DP/PT pulses palpable bilaterally, CRT less than 3 seconds On the plantar lateral aspect of the left foot is hyperkeratotic tissue, pink there is a wound measuring 0.3 x 0.3 x 0.2 cm without any probing, undermining or tunneling.  There is also a small superficial lesion just medial to this as well.  There is no surrounding erythema, ascending cellulitis.  There is no fluctuation crepitation.  There is no malodor. No open lesions or pre-ulcerative lesions.  No pain with calf compression, swelling, warmth, erythema  Assessment: Left foot ulceration  Plan: -All treatment options discussed with the patient including all alternatives, risks, complications.  -Sharply debrided the hyperkeratotic tissue without any complications or bleeding.  Cleaned the wound with good wound cleaner.  Recommend continue hydrogel dressing changes daily which I also dispensed.  Offloading.  Recommended him to go back to the surgical boot for now as this did help heal the wound previously and once the wound is healed we can go back to wearing the shoe with the brace. -Monitor for any clinical signs or symptoms of infection and directed to call the office immediately should any occur or go to the ER. -Patient encouraged to call the office with any questions, concerns, change in symptoms.   RTC 2-3 weeks or sooner if needed  Trula Slade DPM

## 2019-12-07 ENCOUNTER — Other Ambulatory Visit: Payer: Self-pay

## 2019-12-07 ENCOUNTER — Telehealth: Payer: Self-pay | Admitting: *Deleted

## 2019-12-07 ENCOUNTER — Ambulatory Visit: Payer: Medicare Other | Admitting: Podiatry

## 2019-12-07 VITALS — Temp 99.3°F

## 2019-12-07 DIAGNOSIS — Q8502 Neurofibromatosis, type 2: Secondary | ICD-10-CM

## 2019-12-07 DIAGNOSIS — L97521 Non-pressure chronic ulcer of other part of left foot limited to breakdown of skin: Secondary | ICD-10-CM | POA: Diagnosis not present

## 2019-12-07 NOTE — Telephone Encounter (Signed)
Dr. Jacqualyn Posey ordered conforming roll gauze, sterile (bulky) roll gauze, tape, 4x4 gauze for left lateral foot, from [facility]. Faxed required form, clinicals and demographics to [facility].

## 2019-12-07 NOTE — Progress Notes (Signed)
Subjective: 44 year old male presents the office today for follow up evaluation of a ulcer of his left foot.  He states that he was doing well but he was at the beach and he cut his foot on the area of the wound walking on the shower.  Is been keeping Iodosorb on the wound he feels it is getting better.  He is asking for a new prescription for dressing supplies.  Denies any drainage or pus at this time but did have some bloody drainage.  No increase in swelling or redness to his foot. Denies any systemic complaints such as fevers, chills, nausea, vomiting. No acute changes since last appointment, and no other complaints at this time.   Objective: AAO x3, NAD DP/PT pulses palpable bilaterally, CRT less than 3 seconds On the plantar lateral aspect of the left foot is hyperkeratotic tissue, with underlying wound measuring 0.5 x 0.5 x 0.1 cm without any probing, undermining or tunneling.  There is hyperkeratotic periwound.  There is no surrounding erythema, ascending cellulitis.  No fluctuation crepitation.  There is no malodor No open lesions or pre-ulcerative lesions.  No pain with calf compression, swelling, warmth, erythema  Assessment: Left foot ulceration  Plan: -All treatment options discussed with the patient including all alternatives, risks, complications.  -Sharply debrided the wound today utilizing the 312 with scalpel any complications or bleeding.  Continue with the Iodosorb dressing changes for now.  The wound is mildly larger but is more superficial.  I think that because he injured taking out a shower.  No signs of infection, but continue to monitor closely. -We will reorder wound care supplies through [facility] -Monitor for any clinical signs or symptoms of infection and directed to call the office immediately should any occur or go to the ER. -Patient encouraged to call the office with any questions, concerns, change in symptoms.   RTC in 2 weeks. He will see Dr. Posey Pronto for that visit  and I will follow up with him after that.   Trula Slade DPM

## 2019-12-22 ENCOUNTER — Ambulatory Visit (INDEPENDENT_AMBULATORY_CARE_PROVIDER_SITE_OTHER): Payer: Medicare Other | Admitting: Podiatry

## 2019-12-22 ENCOUNTER — Other Ambulatory Visit: Payer: Self-pay

## 2019-12-22 DIAGNOSIS — L97521 Non-pressure chronic ulcer of other part of left foot limited to breakdown of skin: Secondary | ICD-10-CM

## 2019-12-22 DIAGNOSIS — Q8502 Neurofibromatosis, type 2: Secondary | ICD-10-CM | POA: Diagnosis not present

## 2019-12-23 ENCOUNTER — Encounter: Payer: Self-pay | Admitting: Podiatry

## 2019-12-23 NOTE — Progress Notes (Signed)
Subjective: 44 year old male presents with left submetatarsal 5 wound follow-up.  Patient is known to Dr. Jacqualyn Posey who has been managing his wound.  He states that this happened because he was walking on the beach and the wound got worse.  He has been applying Iodosorb dressing.  The dressing is clean dry and intact.  He does not have any purulent drainage or any other clinical signs of infection noted.  He denies any other acute changes to the foot.  Objective: AAO x3, NAD DP/PT pulses palpable bilaterally, CRT less than 3 seconds On the plantar lateral aspect of the left foot is hyperkeratotic tissue, with underlying wound measuring 0.2 cm x 0.1 cm x 0.1 cm without any probing, undermining or tunneling.  There is hyperkeratotic periwound.  There is no surrounding erythema, ascending cellulitis.  No fluctuation crepitation.  There is no malodor No open lesions or pre-ulcerative lesions.  No pain with calf compression, swelling, warmth, erythema  Assessment: Left foot ulceration improving  Plan: -All treatment options discussed with the patient including all alternatives, risks, complications.  -Sharply debrided the wound today utilizing the 312 with scalpel any complications or bleeding.  Continue with the Iodosorb dressing changes for now.  The wound is regressing and is very superficial.  No clinical signs of infection noted. -I have explained to the patient to continue applying Iodosorb bandages until the wound is completely healed. -Monitor for any clinical signs or symptoms of infection and directed to call the office immediately should any occur or go to the ER. -Patient encouraged to call the office with any questions, concerns, change in symptoms.   Patient will be scheduled to see Dr. Jacqualyn Posey in 2 weeks

## 2020-01-07 ENCOUNTER — Ambulatory Visit: Payer: Medicare Other | Admitting: Podiatry

## 2020-01-13 ENCOUNTER — Encounter: Payer: Self-pay | Admitting: Podiatry

## 2020-01-13 ENCOUNTER — Ambulatory Visit: Payer: Medicare Other | Admitting: Podiatry

## 2020-01-13 ENCOUNTER — Other Ambulatory Visit: Payer: Self-pay

## 2020-01-13 VITALS — Temp 97.7°F | Resp 16

## 2020-01-13 DIAGNOSIS — L97521 Non-pressure chronic ulcer of other part of left foot limited to breakdown of skin: Secondary | ICD-10-CM | POA: Diagnosis not present

## 2020-01-14 NOTE — Progress Notes (Signed)
Subjective: 44 year old male presents with left submetatarsal 5 wound follow-up.  Overall he states that he is doing well.  He has not had any drainage coming from the area.  Is been continuing with iodosorb dressing changes.  Denies any swelling or redness or any other new concerns.  No fevers, chills, nausea, vomiting.  No calf pain, chest pain, shortness of breath.  Objective: AAO x3, NAD-presents with father and he is wearing his brace, shoe DP/PT pulses palpable bilaterally, CRT less than 3 seconds On the plantar lateral aspect of the left foot is hyperkeratotic tissue with dried blood.  Upon debridement there is a small superficial central pinpoint wound.  There is no probing, undermining or tunneling.  There is no surrounding erythema, ascending cellulitis.  There is no fluctuation crepitation.  There is no malodor. No open lesions or pre-ulcerative lesions.  No pain with calf compression, swelling, warmth, erythema  Assessment: Left foot superficial ulceration without signs of infection  Plan: -All treatment options discussed with the patient including all alternatives, risks, complications.  -Debrided the hyperkeratotic lesion/ulcer today without any complications or bleeding utilizing a #312 blade scalpal.  There is still a small superficial wound on the central aspect.  We will continue with iodosorb dressing changes daily.  Offloading. -Monitor for any clinical signs or symptoms of infection and directed to call the office immediately should any occur or go to the ER.  Return in about 3 weeks (around 02/03/2020).  Trula Slade DPM

## 2020-02-03 ENCOUNTER — Ambulatory Visit: Payer: Medicare Other | Admitting: Podiatry

## 2020-03-23 MED ORDER — VANCOMYCIN HCL IN DEXTROSE 1-5 GM/200ML-% IV SOLN
1.00 | INTRAVENOUS | Status: DC
Start: 2020-03-23 — End: 2020-03-23

## 2020-03-23 MED ORDER — VALPROIC ACID 250 MG/5ML PO SOLN
1250.00 | ORAL | Status: DC
Start: 2020-03-23 — End: 2020-03-23

## 2020-03-23 MED ORDER — HYDROCORTISONE ACETATE 25 MG RE SUPP
25.00 | RECTAL | Status: DC
Start: ? — End: 2020-03-23

## 2020-03-23 MED ORDER — BISACODYL 10 MG RE SUPP
10.00 | RECTAL | Status: DC
Start: 2020-03-23 — End: 2020-03-23

## 2020-03-23 MED ORDER — IBUPROFEN 400 MG PO TABS
400.00 | ORAL_TABLET | ORAL | Status: DC
Start: ? — End: 2020-03-23

## 2020-03-23 MED ORDER — HYDRALAZINE HCL 25 MG PO TABS
25.00 | ORAL_TABLET | ORAL | Status: DC
Start: ? — End: 2020-03-23

## 2020-03-23 MED ORDER — LEVETIRACETAM 500 MG PO TABS
1000.00 | ORAL_TABLET | ORAL | Status: DC
Start: 2020-03-23 — End: 2020-03-23

## 2020-03-23 MED ORDER — HEPARIN SODIUM (PORCINE) 5000 UNIT/ML IJ SOLN
5000.00 | INTRAMUSCULAR | Status: DC
Start: 2020-03-23 — End: 2020-03-23

## 2020-03-23 MED ORDER — BACLOFEN 10 MG PO TABS
10.00 | ORAL_TABLET | ORAL | Status: DC
Start: 2020-03-23 — End: 2020-03-23

## 2020-03-23 MED ORDER — SENNOSIDES-DOCUSATE SODIUM 8.6-50 MG PO TABS
2.00 | ORAL_TABLET | ORAL | Status: DC
Start: 2020-03-23 — End: 2020-03-23

## 2020-03-23 MED ORDER — GENERIC EXTERNAL MEDICATION
2.00 | Status: DC
Start: 2020-03-23 — End: 2020-03-23

## 2020-03-23 MED ORDER — LACOSAMIDE 50 MG PO TABS
50.00 | ORAL_TABLET | ORAL | Status: DC
Start: 2020-03-23 — End: 2020-03-23

## 2020-03-23 MED ORDER — LORAZEPAM 2 MG/ML IJ SOLN
1.00 | INTRAMUSCULAR | Status: DC
Start: ? — End: 2020-03-23

## 2020-03-23 MED ORDER — ONDANSETRON 4 MG PO TBDP
4.00 | ORAL_TABLET | ORAL | Status: DC
Start: ? — End: 2020-03-23

## 2020-03-23 MED ORDER — SODIUM CHLORIDE 0.9 % IV SOLN
INTRAVENOUS | Status: DC
Start: ? — End: 2020-03-23

## 2020-03-23 MED ORDER — POLYETHYLENE GLYCOL 3350 17 GM/SCOOP PO POWD
17.00 | ORAL | Status: DC
Start: 2020-03-24 — End: 2020-03-23

## 2020-03-23 MED ORDER — ACETAMINOPHEN 500 MG PO TABS
1000.00 | ORAL_TABLET | ORAL | Status: DC
Start: 2020-03-23 — End: 2020-03-23

## 2020-05-08 ENCOUNTER — Encounter: Payer: Self-pay | Admitting: *Deleted

## 2020-05-08 NOTE — PMR Pre-admission (Signed)
PMR Admission Coordinator Pre-Admission Assessment  Patient: Cameron Hale is an 44 y.o., male [name]: [id number] DOB: Jan 08, 1976 Height: 5\' 10"  (1.778 m) Weight: 68 kg  Insurance Information HMO:     PPO:      PCP:      IPA:      80/20:      OTHER:  PRIMARY: Medicare a and b      Policy#: [id number]      Subscriber: pt Benefits:  Phone #: passport one online     Name:  Eff. Date: 06/19/2011     Deduct: $1484      Out of Pocket Max: none      Life Max: none CIR: 100%      SNF: 20 full days Outpatient: 80%     Co-Pay: 20% Home Health: 100%      Co-Pay: none DME: 80%     Co-Pay: 20% Providers: pt choice  On 05/09/2020 patient is Day 123 of his hospitalization with continuous acute admission between acute hospital and [facility] admission twice and returned to acute hospital .   Medicare coverage Days 1 until 60 no coinsurance for each benefit period           Days 61 until 90 $371 coinsurance per day                       Days 91 and beyond $742 coinsurance per day for each "lifetime reserve day". Up to 60 days over you lifetime. Beyond lifetime reserve days, patient covers all costs per www.medicare.gov  Reviewed with pateint , wife, Mom and sister, Joseph Art of cost of care. Advised they look into Medicare supplement to assist with coverage. Patient had [facility] Medicare coverage until he dropped coverage on 02/14/2020 and elected Traditional Medicare coverage without a supplement. He switched coverage due to denial of his [facility] Medicare for AIR admit.   SECONDARY: none  Financial Counselor:       Phone#:   The Therapist, art Information Summary" for patients in Inpatient Rehabilitation Facilities with attached "Privacy Act [place] Records" was provided and verbally reviewed with: Patient and Family  Emergency Contact Information Contact Information    Name Relation Home Work Mobile   Chea,[name] Spouse [phone]  [phone]   [name] Mother    [place], [place] Sister  [phone]      Mom's spouse, Konrad Dolores very involved. Patient's sister is Personnel officer for [employer].  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 Admitted to [facility] 02/04/2020 for dural based extramedullary spinal tumor s/p resection and removal of C5-7 hardware with Dr. Prince Rome . Postoperatively progressed initially to be discharged to [facility] on 02/14/2020. Discharged on oral pain med, tolerating regular diet and Dexamethasone taper.   Patient readmitted to acute hospital with AMS concerning for possible infectious fluid collection in the upper thoracic and lower cervical region. Went to OR for persistent CSF leak and closure of dura and irrigation on 02/20/2020. Returned to [facility] AIR on 4/6. Returned to acute on 02/29/2020 with fevers and  aphasia. Placed on antibiotics. 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   Patient's medical record from [facility] has been  reviewed by the rehabilitation admission coordinator and physician.  Past Medical History  multiple surgeries for meningioma/neurofibroma resection NF2 Neurofibromatosis  Family History   family history is not on file.  Prior Rehab/Hospitalizations Has the patient had prior rehab or hospitalizations prior to admission? Yes [facility] two admits since 01/2020 and then returned to acute due to medical issues  Has the patient had major surgery during 100 days prior to admission? Yes   Current Medications  Current Outpatient Medications:  .  acetaminophen (TYLENOL) 325 MG tablet, Take 650 mg by mouth., Disp: ,  Rfl:  .  aspirin-acetaminophen-caffeine (EXCEDRIN MIGRAINE) 250-250-65 MG tablet, Take by mouth., Disp: , Rfl:  .  cephALEXin (KEFLEX) 500 MG capsule, Take 1 capsule (500 mg total) by mouth 3 (three) times daily., Disp: 30 capsule, Rfl: 2 .  cetirizine (ZYRTEC) 10 MG tablet, Take by mouth., Disp: , Rfl:  .  ciprofloxacin (CIPRO) 500 MG tablet, Take 1 tablet (500 mg total) by mouth 2 (two) times daily., Disp: 20 tablet, Rfl: 0 .  dexamethasone (DECADRON) 4 MG tablet, , Disp: , Rfl:  .  diazepam (VALIUM) 5 MG tablet, Take 5 mg by mouth., Disp: , Rfl:  .  doxycycline (VIBRA-TABS) 100 MG tablet, Take 1 tablet (100 mg total) by mouth 2 (two) times daily., Disp: 20 tablet, Rfl: 0 .  ketoconazole (NIZORAL) 200 MG tablet, Take 200 mg by mouth daily., Disp: , Rfl: 0 .  levETIRAcetam (KEPPRA XR) 500 MG 24 hr tablet, Take by mouth., Disp: , Rfl:  .  loratadine (CLARITIN) 10 MG tablet, Take by mouth., Disp: , Rfl:  .  mupirocin ointment (BACTROBAN) 2 %, Apply 1 application topically 2 (two) times daily., Disp: 30 g, Rfl: 2 .  Omega-3 1000 MG CAPS, Take 1 g by mouth., Disp: , Rfl:  .  oxyCODONE (OXY IR/ROXICODONE) 5 MG immediate release tablet, Take 5 mg by mouth., Disp: , Rfl:  .  propranolol (INDERAL) 40 MG tablet, Take by mouth., Disp: , Rfl:  .  ranitidine (ZANTAC) 150 MG tablet, Take by mouth., Disp: , Rfl:  .  senna-docusate (SENOKOT-S) 8.6-50 MG tablet, Take by mouth., Disp: , Rfl:  .  silver sulfADIAZINE (SILVADENE) 1 % cream, Apply 1 application topically daily., Disp: 50 g, Rfl: 0 .  traMADol (ULTRAM) 50 MG tablet, TAKE 1 TABLET (50 MG TOTAL) BY MOUTH EVERY 6 (SIX) HOURS AS NEEDED FOR UP TO 5 DAYS., Disp: , Rfl:   Patients Current Diet: Diet Regular with thin liquids / Dysphagia noted oropharyngeal phase. Meds crushed with puree  Precautions / Restrictions Precautions: Fall Precautions/Special Needs: Orthotics/Bracing Precaution Comments: seizure precautions Weight Bearing Restrictions: No     Has the patient had 2 or more falls or a fall with injury in the past year? No  Prior Activity Level  Mod I with occasional use of RW in the home and BLE AFOs. Independent with adls. Used wheelchair in the community. Drove car with handicapped controls  Prior Functional Level Self Care: Did the patient need help bathing, dressing, using the toilet or eating? Independent  Indoor Mobility: Did the patient need assistance with walking from room to room (with or without device)? Independent  Stairs: Did the patient need assistance with internal or external stairs (with or without device)? Independent  Functional Cognition: Did the patient need help planning regular tasks such as shopping or remembering to take medications? Independent  Home Assistive Devices / Teacher, adult education (specify type) (BLE AFOS)   Prior Device  Use: Indicate devices/aids used by the patient prior to current illness, exacerbation or injury? Manual wheelchair and Walker   Prior Functional Level Current Functional Level  Bed Mobility   Independent  max assist of 2 for rolling and supine to sit    Transfers   Independent   max assist of 2 for slide board transfers to and from wheelchair. performed sitting balance, therex, and therapeutic activity both in room and in gym on therapy mat. Patient able to hold himself in sitting with supervision only in short sit edge of mat with posterior props on hands with min to mod assist requiring 2 rest breaks due to fatigue.Completed session of 98 minutes on 05/03/2020. Attempted to roll onto stomach but unable to fully clear RUE and position pt 2/2 fixed cervical flexion   Mobility - Walk/Wheelchair   Mod I with RW and furniture surfing with BLE AFOS   n/a   Upper Body Dressing   Independent   LUE lacks active wrist extension against gravity, use of long opponens splint. Right shoulder stronger than left . Needs use of long splints and universal cuff component . Lacks UE  strength to bring hand to mouth for ADL. Atrophy noted throughout. Max dependent Needs max verbal cues/encouragement  Lower Body Dressing   Independent   total dependent   Grooming   Independent   total dependent   Eating/Drinking   Independent   use of splint an universal cuff   Toilet Transfer   Independent   max to total   Bladder Continence    Continent   condom catheter   Bowel Management   Continent   incontinent with bowel program in process   Stair Climbing   not done   unable   Communication   Independent   intact   Memory   Independent  intact     Special Needs/ Care Considerations   Condom catheter for bladder management Incontinent bowel with bowel program initiated Bilateral Meda boots RUE long opponens splint and LUE short opponens splint with 4 hrs on and 4 hrs off schedule Universal cuff for assist with feeding. Left hand dominant but typically feed with right hand Hearing loss right ear Wears glasses Left CN 6 palsy Legally blind left eye Designated visitors are his Mom, [name] and her spouse, Konrad Dolores and patient's wife Johny Chess. Parents are main visitors and Wife to come weekly. [name] with recent surgery and unable to visit as much. Parents likely primary caregivers at discharge [place] bed/low air loss bed  Previous Home Environment  Living Arrangements: Spouse/significant other  Lives With: Spouse Available Help at Discharge: Family;Friend(s);Available 24 hours/day Type of Home: House Home Layout: One level Home Access: Stairs to enter Entrance Stairs-Rails: Right Entrance Stairs-Number of Steps: 4 Bathroom Shower/Tub: Chiropodist: Standard Bathroom Accessibility: Yes How Accessible: Accessible via walker Home Care Services: No  Discharge Living Setting Plans for Discharge Living Setting: Lives with (comment) (to go to stay with parents in their home) Type of Home at Discharge: House Discharge Home Layout:  One level Discharge Home Access: [place] entrance Discharge Bathroom Shower/Tub: [place] unit Discharge Bathroom Toilet: Standard Discharge Bathroom Accessibility: Yes How Accessible: Accessible via walker Does the patient have any problems obtaining your medications?: No  Social/Family/Support Systems Patient Roles: Spouse Contact Information: Mom Lennie Muckle and wife, Johny Chess Anticipated Caregiver: parents and wife Anticipated Caregiver's Contact Information: Vedia Pereyra [phone] and Johny Chess [phone] Ability/Limitations of Caregiver: parents are 69 but very physically able; wife is  a Pharmacist, hospital but recently had surgery with physical limitations week 4 of 6 week limitation Caregiver Availability: 24/7 Discharge Plan Discussed with Primary Caregiver: Yes Is Caregiver In Agreement with Plan?: Yes Does Caregiver/Family have Issues with Lodging/Transportation while Pt is in Rehab?: No  Goals Patient/Family Goal for Rehab: mod assist with PT and OT, supervision SLP at wheelchair level Expected length of stay: ELOS 3 to 4 weeks Additional Information: Hospitalized since 02/04/2020 Pt/Family Agrees to Admission and willing to participate: Yes Program Orientation Provided & Reviewed with Pt/Caregiver Including Roles  & Responsibilities: Yes  Decrease burden of Care through IP rehab admission: n/a  Possible need for SNF placement upon discharge: family do not want to pursue SNF after CIR, but it is an option if patient does not reach level to be able to direct discharge home. Family are hopeful for patient to discharge to his parents home.  Patient Condition: I have reviewed medical records from [facility] , spoken with CSW, and patient, spouse and family member. I discussed via phone for inpatient rehabilitation assessment.  Patient will benefit from ongoing PT, OT and SLP, can actively participate in 3 hours of therapy a day 5 days of the week, and can make  measurable gains during the admission.  Patient will also benefit from the coordinated team approach during an Inpatient Acute Rehabilitation admission.  The patient will receive intensive therapy as well as Rehabilitation physician, nursing, social worker, and care management interventions.  Due to bladder management, bowel management, safety, skin/wound care, disease management, medication administration, pain management and patient education the patient requires 24 hour a day rehabilitation nursing. The patient is currently max assist to total assist overall  with mobility and basic ADLs.  Discharge setting and therapy post discharge at home with home health is anticipated.  Patient has agreed to participate in the Acute Inpatient Rehabilitation Program and will admit today.  Preadmission Screen Completed By:  Cleatrice Burke, 05/09/2020 9:48 AM ______________________________________________________________________   Discussed status with Dr. Dagoberto Ligas on  05/09/2020 at 0950 and received approval for admission today.  Admission Coordinator:  Cleatrice Burke, RN, time 1963 Date  05/09/2020   Assessment/Plan: Diagnosis: 1. Does the need for close, 24 hr/day Medical supervision in concert with the patient's rehab needs make it unreasonable for this patient to be served in a less intensive setting? Yes 2. Co-Morbidities requiring supervision/potential complications: neurofibromatosis, partial blindness, dysphagia, incomplete quadriplegia, seizures 3. Due to bladder management, bowel management, safety, skin/wound care, disease management, medication administration, pain management and patient education, does the patient require 24 hr/day rehab nursing? Yes 4. Does the patient require coordinated care of a physician, rehab nurse, PT, OT, and SLP to address physical and functional deficits in the context of the above medical diagnosis(es)? Yes Addressing deficits in the following areas: balance,  endurance, locomotion, transferring, bowel/bladder control, bathing, dressing, feeding, grooming, toileting and swallowing 5. Can the patient actively participate in an intensive therapy program of at least 3 hrs of therapy 5 days a week? Yes 6. The potential for patient to make measurable gains while on inpatient rehab is good and fair 7. Anticipated functional outcomes upon discharge from inpatient rehab: supervision and min assist PT, supervision and min assist OT, modified independent and supervision SLP 8. Estimated rehab length of stay to reach the above functional goals is: 3-4 weeks 9. Anticipated discharge destination: Home 10. Overall Rehab/Functional Prognosis: good and fair  MD Signature **

## 2020-05-09 ENCOUNTER — Other Ambulatory Visit: Payer: Self-pay

## 2020-05-09 ENCOUNTER — Encounter (HOSPITAL_COMMUNITY): Payer: Self-pay | Admitting: Physical Medicine and Rehabilitation

## 2020-05-09 ENCOUNTER — Inpatient Hospital Stay (HOSPITAL_COMMUNITY)
Admission: RE | Admit: 2020-05-09 | Discharge: 2020-06-05 | DRG: 945 | Disposition: A | Discharge status: Home Health | Payer: Medicare Other | Source: Other Acute Inpatient Hospital | Attending: Physical Medicine and Rehabilitation | Admitting: Physical Medicine and Rehabilitation

## 2020-05-09 DIAGNOSIS — R5381 Other malaise: Secondary | ICD-10-CM | POA: Diagnosis present

## 2020-05-09 DIAGNOSIS — Z9221 Personal history of antineoplastic chemotherapy: Secondary | ICD-10-CM | POA: Diagnosis not present

## 2020-05-09 DIAGNOSIS — H548 Legal blindness, as defined in USA: Secondary | ICD-10-CM | POA: Diagnosis present

## 2020-05-09 DIAGNOSIS — G8222 Paraplegia, incomplete: Secondary | ICD-10-CM | POA: Diagnosis not present

## 2020-05-09 DIAGNOSIS — Q8500 Neurofibromatosis, unspecified: Secondary | ICD-10-CM

## 2020-05-09 DIAGNOSIS — G9389 Other specified disorders of brain: Secondary | ICD-10-CM | POA: Diagnosis present

## 2020-05-09 DIAGNOSIS — G8252 Quadriplegia, C1-C4 incomplete: Secondary | ICD-10-CM | POA: Diagnosis present

## 2020-05-09 DIAGNOSIS — E871 Hypo-osmolality and hyponatremia: Secondary | ICD-10-CM

## 2020-05-09 DIAGNOSIS — G40909 Epilepsy, unspecified, not intractable, without status epilepticus: Secondary | ICD-10-CM | POA: Diagnosis present

## 2020-05-09 DIAGNOSIS — Z905 Acquired absence of kidney: Secondary | ICD-10-CM

## 2020-05-09 DIAGNOSIS — R339 Retention of urine, unspecified: Secondary | ICD-10-CM | POA: Diagnosis present

## 2020-05-09 DIAGNOSIS — I959 Hypotension, unspecified: Secondary | ICD-10-CM | POA: Diagnosis not present

## 2020-05-09 DIAGNOSIS — E876 Hypokalemia: Secondary | ICD-10-CM | POA: Diagnosis not present

## 2020-05-09 DIAGNOSIS — Q8502 Neurofibromatosis, type 2: Secondary | ICD-10-CM | POA: Diagnosis not present

## 2020-05-09 DIAGNOSIS — R569 Unspecified convulsions: Secondary | ICD-10-CM

## 2020-05-09 DIAGNOSIS — D62 Acute posthemorrhagic anemia: Secondary | ICD-10-CM | POA: Diagnosis present

## 2020-05-09 DIAGNOSIS — E875 Hyperkalemia: Secondary | ICD-10-CM | POA: Diagnosis present

## 2020-05-09 DIAGNOSIS — K592 Neurogenic bowel, not elsewhere classified: Secondary | ICD-10-CM | POA: Diagnosis present

## 2020-05-09 DIAGNOSIS — G936 Cerebral edema: Secondary | ICD-10-CM | POA: Diagnosis present

## 2020-05-09 DIAGNOSIS — N319 Neuromuscular dysfunction of bladder, unspecified: Secondary | ICD-10-CM | POA: Diagnosis present

## 2020-05-09 DIAGNOSIS — R1310 Dysphagia, unspecified: Secondary | ICD-10-CM | POA: Diagnosis present

## 2020-05-09 DIAGNOSIS — D72829 Elevated white blood cell count, unspecified: Secondary | ICD-10-CM | POA: Diagnosis not present

## 2020-05-09 DIAGNOSIS — Z981 Arthrodesis status: Secondary | ICD-10-CM

## 2020-05-09 DIAGNOSIS — R4182 Altered mental status, unspecified: Secondary | ICD-10-CM | POA: Diagnosis not present

## 2020-05-09 DIAGNOSIS — G9340 Encephalopathy, unspecified: Secondary | ICD-10-CM | POA: Diagnosis present

## 2020-05-09 DIAGNOSIS — G8250 Quadriplegia, unspecified: Secondary | ICD-10-CM | POA: Diagnosis present

## 2020-05-09 DIAGNOSIS — I10 Essential (primary) hypertension: Secondary | ICD-10-CM | POA: Diagnosis present

## 2020-05-09 DIAGNOSIS — M7989 Other specified soft tissue disorders: Secondary | ICD-10-CM | POA: Diagnosis not present

## 2020-05-09 DIAGNOSIS — B379 Candidiasis, unspecified: Secondary | ICD-10-CM | POA: Diagnosis present

## 2020-05-09 HISTORY — DX: Neurofibromatosis, type 2: Q85.02

## 2020-05-09 LAB — CBC
HCT: 36.5 % — ABNORMAL LOW (ref 39.0–52.0)
Hemoglobin: 11.8 g/dL — ABNORMAL LOW (ref 13.0–17.0)
MCH: 27.3 pg (ref 26.0–34.0)
MCHC: 32.3 g/dL (ref 30.0–36.0)
MCV: 84.3 fL (ref 80.0–100.0)
Platelets: 319 10*3/uL (ref 150–400)
RBC: 4.33 MIL/uL (ref 4.22–5.81)
RDW: 15.4 % (ref 11.5–15.5)
WBC: 14.2 10*3/uL — ABNORMAL HIGH (ref 4.0–10.5)
nRBC: 0 % (ref 0.0–0.2)

## 2020-05-09 LAB — CREATININE, SERUM
Creatinine, Ser: 0.33 mg/dL — ABNORMAL LOW (ref 0.61–1.24)
GFR calc Af Amer: >60 mL/min (ref >60)
GFR calc non Af Amer: >60 mL/min (ref >60)

## 2020-05-09 MED ORDER — SENNOSIDES-DOCUSATE SODIUM 8.6-50 MG PO TABS
2.0000 | ORAL_TABLET | Freq: Two times a day (BID) | ORAL | Status: DC
  Administered 2020-05-09 – 2020-06-03 (×43): 2 via ORAL
  Filled 2020-05-09 (×50): qty 2

## 2020-05-09 MED ORDER — PROPRANOLOL HCL 10 MG PO TABS
10.0000 mg | ORAL_TABLET | Freq: Two times a day (BID) | ORAL | Status: DC
  Administered 2020-05-09 – 2020-06-05 (×53): 10 mg via ORAL
  Filled 2020-05-09 (×54): qty 1

## 2020-05-09 MED ORDER — SODIUM CHLORIDE 1 G PO TABS
1.0000 g | ORAL_TABLET | Freq: Three times a day (TID) | ORAL | Status: DC
  Administered 2020-05-09 – 2020-06-05 (×73): 1 g via ORAL
  Filled 2020-05-09 (×77): qty 1

## 2020-05-09 MED ORDER — HEPARIN SODIUM (PORCINE) 5000 UNIT/ML IJ SOLN
5000.0000 [IU] | Freq: Three times a day (TID) | INTRAMUSCULAR | Status: DC
  Administered 2020-05-09 – 2020-05-13 (×13): 5000 [IU] via SUBCUTANEOUS
  Filled 2020-05-09 (×13): qty 1

## 2020-05-09 MED ORDER — SORBITOL 70 % SOLN: 30.0000 mL | Freq: Every day | Status: DC | PRN

## 2020-05-09 MED ORDER — LEVETIRACETAM 500 MG PO TABS
1000.0000 mg | ORAL_TABLET | Freq: Two times a day (BID) | ORAL | Status: DC
  Administered 2020-05-10 – 2020-05-12 (×6): 1000 mg via ORAL
  Filled 2020-05-09 (×7): qty 2

## 2020-05-09 MED ORDER — BACLOFEN 10 MG PO TABS
10.0000 mg | ORAL_TABLET | Freq: Two times a day (BID) | ORAL | Status: DC
  Administered 2020-05-09 – 2020-06-05 (×53): 10 mg via ORAL
  Filled 2020-05-09 (×54): qty 1

## 2020-05-09 MED ORDER — HEPARIN SODIUM (PORCINE) 5000 UNIT/ML IJ SOLN: 5000.0000 [IU] | Freq: Three times a day (TID) | INTRAMUSCULAR | Status: DC

## 2020-05-09 MED ORDER — BISACODYL 10 MG RE SUPP: 10.0000 mg | Freq: Every day | RECTAL | Status: DC | PRN

## 2020-05-09 MED ORDER — FAMOTIDINE 20 MG PO TABS
20.0000 mg | ORAL_TABLET | Freq: Two times a day (BID) | ORAL | Status: DC
  Administered 2020-05-09 – 2020-06-05 (×53): 20 mg via ORAL
  Filled 2020-05-09 (×54): qty 1

## 2020-05-09 MED ORDER — DEXAMETHASONE 4 MG PO TABS
4.0000 mg | ORAL_TABLET | Freq: Three times a day (TID) | ORAL | Status: DC
  Administered 2020-05-09 – 2020-05-10 (×5): 4 mg via ORAL
  Filled 2020-05-09 (×4): qty 1

## 2020-05-09 MED ORDER — ACETAMINOPHEN 325 MG PO TABS
650.0000 mg | ORAL_TABLET | Freq: Four times a day (QID) | ORAL | Status: DC | PRN
  Administered 2020-05-10 – 2020-06-03 (×18): 650 mg via ORAL
  Filled 2020-05-09 (×18): qty 2

## 2020-05-09 MED ORDER — LEVETIRACETAM ER 500 MG PO TB24
1000.0000 mg | ORAL_TABLET | Freq: Two times a day (BID) | ORAL | Status: DC
  Administered 2020-05-09: 1000 mg via ORAL
  Filled 2020-05-09: qty 2

## 2020-05-09 MED ORDER — POLYETHYLENE GLYCOL 3350 17 G PO PACK
17.0000 g | PACK | Freq: Every day | ORAL | Status: DC
  Administered 2020-05-09 – 2020-06-03 (×23): 17 g via ORAL
  Filled 2020-05-09 (×26): qty 1

## 2020-05-09 NOTE — Plan of Care (Signed)
Care plan goals set.

## 2020-05-09 NOTE — H&P (Signed)
Physical Medicine and Rehabilitation Admission H&P     HPI: Cameron Hale is a 44 year old left-handed male with history of neurofibromatosis type II followed by Dr. Prince Rome neurosurgery at [facility], seizure disorder, hard of hearing, legally blind on the left, multiple prior surgeries for meningioma neurofibroma resection.  Per chart review patient lives with wife.  Modified independent prior to admission using bilateral AFOs.  Wife is currently limited physically.  His parents plan to assist on discharge.Sister locally with good support.  He will be discharged home with his parents.  1 level home with ramped entry.  Patient with recent  C5-7 PCF 02/04/2020 for resection of dural based spinal tumor he was initially discharged to inpatient rehab services 6/73/4193 complicated by CSF leak returning back to the hospital requiring wound revision on 02/20/2020 who was discharged to inpatient rehab/[facility] on 02/22/2020 and experienced exam decline with fever and speech difficulties as well as lower extremity weakness on 02/29/2020 and was readmitted to the neurosurgical floor due to concern for subclinical seizure and altered mental status he was transferred to the neuro ICU and maintained on Keppra.  He also remained on Decadron protocol.  Patient was later transferred back to the neurosurgical floor with seizures controlled mental status continued to wax and wane.  He remained on triple antibiotic therapy of vancomycin cefepime and Flagyl times a total of 8 weeks completed 04/28/2020 and all antibiotics since discontinued.  Hospital course complicated by hyponatremia and maintained sodium chloride tablets.Latest Sodium 136 per father.  Patient with neurogenic bowel and bladder and working to establish bowel program.  He was cleared to begin subcutaneous heparin for DVT prophylaxis.  His diet has been advanced to regular.  Recommendations were made for referral to intense comprehensive  rehabilitation program.  Patient was admitted for a comprehensive rehab program.  Pt reports didn't have call bell at [facility]- was using condom catheter- but doesn't know if retaining urine. Mother also notes, pt was going for chemotherapy q3 weeks prior to hospitalization for reducing size of Neurofibromas- suggested I call Dr. Boris Sharper about this.  Reports voice weak, but denies pain.  Thinks has a little control of bowel, but not much. Has been using depends.  Per Dr Prince Rome d/w him, pt has undergone 7 surgeries by Dr Prince Rome and NSU has watched him go from  Walking, to using RW, to now, not being able to walk.       Review of Systems  Constitutional: Positive for fever and malaise/fatigue.  HENT: Positive for hearing loss.   Eyes:       Legally blind left eye  Respiratory: Negative for cough and shortness of breath.   Cardiovascular: Positive for leg swelling. Negative for chest pain and palpitations.  Gastrointestinal: Positive for constipation. Negative for heartburn, nausea and vomiting.  Genitourinary: Negative for dysuria, flank pain and hematuria.       Urinary retention  Musculoskeletal: Positive for joint pain and myalgias.  Skin: Negative for rash.  Neurological: Positive for sensory change, speech change and weakness.  All other systems reviewed and are negative.  No past medical history on file. Past Surgical History:  Procedure Laterality Date  . brain bleed     mri 2009  . LUMBAR FUSION     No family history on file. Social History:  reports that he has never smoked. He has never used smokeless tobacco. No history on file for alcohol use and drug use. Allergies:  Allergies  Allergen Reactions  .  Amoxicillin Itching  . Morphine Itching   No medications prior to admission.    Drug Regimen Review Drug regimen was reviewed and remains appropriate with no significant issues identified  Home: Plan discharged home with parents 1 level home ramped entrance.       Functional History: Modified independent with bilateral AFOs    Functional Status:  Mobility: Total assist          ADL: Total assist    Cognition:WFL      Physical Exam: 110/70 pulse 80 temperature 98 respirations 18 Physical Exam  Nursing note and vitals reviewed. Constitutional:  Non-toxic appearance. No distress.  Pt awake, sitting up in bed; mother and father at bedside; alert, but vague- doesn't remember most of hospitalization, otherwise appropriate, NAD  HENT:  Head: Normocephalic.  Right Ear: External ear normal.  Left Ear: External ear normal.  Nose: Nose normal. No rhinorrhea or congestion.  Mouth/Throat: Mucous membranes are dry. No oropharyngeal exudate or posterior oropharyngeal erythema. Oropharynx is clear.  Eyes:  6 CN palsy as above Blind L eye (since birth)   Cardiovascular:  RRR- no M/R/G  Respiratory: Effort normal and breath sounds normal. No stridor. No respiratory distress. He has no wheezes. He has no rhonchi. He has no rales.  GI: Soft. Normal appearance.  Soft, NT, ND, (+)BS - flat  Genitourinary:    Testes and penis normal.     Genitourinary Comments: Condom catheter in place draining light amber urine   Musculoskeletal:     Cervical back: Normal range of motion and neck supple. No rigidity.     Comments: Full strength exam done RUE- biceps 4-/5, WE 2-/5, triceps 1/5, grip 2-/5, finger abd 1/5 LUE- biceps 3/5, WE 2-/5, triceps 2-/5, grip 1/5, finger abd 1/5 RLE- HF, KE, DF, PF all 1/5 LLE- HF 2-/5; otherwise 0/5 in LLE  Neurological: He is alert.  Alert male in no acute distress.  Family at bedside.  Follows commands.  Provides his name and age.  Hoffman's in RUE- not LUE No clonus; MAS of 1 in UEs and 1+ in LEs Sensation to light touch impaired from C5 downwards- gets less notable as goes down torso/legs. - never absent- checked every dermatome.    Skin: Skin is warm and dry.  Tiny, tiny area that's pink around anus- blanchable-  skin tear- not open  No IV in UEs/LEs- dressing from previous PICC line  Psychiatric: Mood normal.          Medical Problem List and Plan: 1.  Decreased functional mobility/C4 ASIA B/C myelopathy with neurogenic bowel and bladder and mild spasticity secondary to neurofibromatosis type II with multiple prior surgeries for meningioma neurofibroma resection and most recent resection of dural based spinal tumor 4/40/3474 complicated by CSF leak wound revision.  Antibiotic therapy completed 04/28/2020  -patient may  shower  -ELOS/Goals: goals min-mod A- 3-4 weeks 2.  Antithrombotics: -DVT/anticoagulation: Subcutaneous heparin.  Check vascular study  -antiplatelet therapy: n/a 3. Pain Management: Baclofen 10 mg twice daily 4. Mood: not on any meds  -antipsychotic agents: n/a 5. Neuropsych: This patient is somewhat capable of making decisions on his own behalf. 6. Skin/Wound Care: no open skin areas- however has an area of irritation near anus- Stage I but TINY- will monitor 7. Fluids/Electrolytes/Nutrition: regular diet with thin liquids- need SLP for dysarthria/dysphagia and cognition 8.  Neurogenic bowel and bladder.  Suppository nightly as needed, MiraLAX daily.  Check PVR's 9.  Seizure disorder.  Keppra 1000 mg twice daily,  Inderal 10 mg twice daily 10.  Hyponatremia.  Continue sodium chloride tablets 3 times daily.  Follow-up chemistries- last Na 136 per father 27. Neurofibromatosis-2- was getting steroids- per Dr Prince Rome can reduce by 20% every few days;  -will also try and contact Dr Shaaron Adler about possible additional chemotherapy- Dr Prince Rome was CLEAR can use estim, since not cancerous dx.  12. Impaired function  -will order grey call bell for pt.    Lavon Paganini Angiulli, PA-C 05/09/2020    I have personally performed a face to face diagnostic evaluation of this patient and formulated the key components of the plan.  Additionally, I have personally reviewed laboratory data, imaging  studies, as well as relevant notes and concur with the physician assistant's documentation above.   The patient's status has not changed from the original H&P.  Any changes in documentation from the acute care chart have been noted above.    I spent a total of 125 minutes on admission 45 minutes reviewing the chart, 40 minutes examining pt and getting additional hx from parents, 20 minutes typing up and 20 minutes talking with Dr Prince Rome about case.

## 2020-05-09 NOTE — Progress Notes (Addendum)
Inpatient Rehabilitation Medication Review by a Pharmacist  A complete drug regimen review was completed for this patient to identify any potential clinically significant medication issues.  Clinically significant medication issues were identified:  Yes   Type of Medication Issue Identified Description of Issue Urgent (address now) Non-Urgent (address on AM team rounds) Plan Plan Accepted by Provider? (Yes / No / Pending AM Rounds)  Drug Interaction(s) (clinically significant)       Duplicate Therapy       Allergy       No Medication Administration End Date       Incorrect Dose       Additional Drug Therapy Needed       Other  The following medications were listed on discharge summary, but have not been ordered at rehab: Refresh Tears, nystatin suspension. Per discharge summary, Keppra XR had been changed to Keppra immediate-release formulation Non-urgent Rehab team to review on AM rounds    Adjusted Keppra formulation, per discharge summary Pending AM rounds   For non-urgent medication issues to be resolved on team rounds tomorrow morning a [facility] Secure Chat Handoff was sent to: did not send secure chat since after hours and non-urgent issues will be discussed at AM rounds  Time spent performing this drug regimen review (minutes):  [place], PharmD, BCPS, [facility] Clinical Pharmacist 05/09/2020 9:29 PM

## 2020-05-09 NOTE — Progress Notes (Signed)
Cristina Gong, RN  Rehab Admission Coordinator  Physical Medicine and Rehabilitation  PMR Pre-admission  Signed  Encounter Date:  05/08/2020      Related encounter: Documentation from 05/08/2020 in [place]       Show:Clear all [x] Manual[x] Template[] Copied  Added by: [x] Cristina Gong, RN[x] Courtney Heys, MD  [] Hover for details PMR Admission Coordinator Pre-Admission Assessment   Patient: Cameron Hale is an 44 y.o., male [name]: [id number] DOB: 03/05/1976 Height: 5\' 10"  (1.778 m) Weight: 68 kg   Insurance Information HMO:     PPO:      PCP:      IPA:      80/20:      OTHER:  PRIMARY: Medicare a and b      Policy#: [id number]      Subscriber: pt Benefits:  Phone #: passport one online     Name:  Eff. Date: 06/19/2011     Deduct: $1484      Out of Pocket Max: none      Life Max: none CIR: 100%      SNF: 20 full days Outpatient: 80%     Co-Pay: 20% Home Health: 100%      Co-Pay: none DME: 80%     Co-Pay: 20% Providers: pt choice   On 05/09/2020 patient is Day 123 of his hospitalization with continuous acute admission between acute hospital and [facility] admission twice and returned to acute hospital .    Medicare coverage Days 1 until 60 no coinsurance for each benefit period                                 Days 61 until 90 $371 coinsurance per day                                  Days 91 and beyond $742 coinsurance per day for each "lifetime reserve day". Up to 60 days over you lifetime. Beyond lifetime reserve days, patient covers all costs per www.medicare.gov   Reviewed with pateint , wife, Mom and sister, Joseph Art of cost of care. Advised they look into Medicare supplement to assist with coverage. Patient had [facility] Medicare coverage until he dropped coverage on 02/14/2020 and elected Traditional Medicare coverage without a supplement. He switched coverage due to denial of his [facility] Medicare for AIR admit.     SECONDARY: none   Financial Counselor:       Phone#:    The Therapist, art Information Summary" for patients in Inpatient Rehabilitation Facilities with attached "Privacy Act [place] Records" was provided and verbally reviewed with: Patient and Family   Emergency Contact Information         Contact Information     Name Relation Home Work Mobile    Dede,[name] Spouse [phone]   [phone]    [name] Mother     [place], [place] Sister   [phone]         Mom's spouse, Konrad Dolores very involved. Patient's sister is Personnel officer for [employer].   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 Prince Rome . Postoperatively progressed initially to be discharged to [facility] on 02/14/2020. Discharged on oral pain med, tolerating regular diet and Dexamethasone taper.    Patient readmitted to acute hospital with AMS concerning for possible infectious fluid collection in the upper thoracic and lower cervical region. Went to OR for persistent CSF leak and closure of dura and irrigation on 02/20/2020. Returned to [facility] AIR on 4/6. Returned to acute on 02/29/2020 with fevers and  aphasia. Placed on antibiotics. 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    Patient's medical record from [facility] has been reviewed by the rehabilitation admission coordinator and physician.   Past Medical History  multiple surgeries for meningioma/neurofibroma resection NF2  Neurofibromatosis   Family History   family history is not on file.   Prior Rehab/Hospitalizations Has the patient had prior rehab or hospitalizations prior to admission? Yes [facility] two admits since 01/2020 and then returned to acute due to medical issues   Has the patient had major surgery during 100 days prior to admission? Yes              Current Medications   Current Outpatient Medications:  .  acetaminophen (TYLENOL) 325 MG tablet, Take 650 mg by mouth., Disp: , Rfl:  .  aspirin-acetaminophen-caffeine (EXCEDRIN MIGRAINE) 250-250-65 MG tablet, Take by mouth., Disp: , Rfl:  .  cephALEXin (KEFLEX) 500 MG capsule, Take 1 capsule (500 mg total) by mouth 3 (three) times daily., Disp: 30 capsule, Rfl: 2 .  cetirizine (ZYRTEC) 10 MG tablet, Take by mouth., Disp: , Rfl:  .  ciprofloxacin (CIPRO) 500 MG tablet, Take 1 tablet (500 mg total) by mouth 2 (two) times daily., Disp: 20 tablet, Rfl: 0 .  dexamethasone (DECADRON) 4 MG tablet, , Disp: , Rfl:  .  diazepam (VALIUM) 5 MG tablet, Take 5 mg by mouth., Disp: , Rfl:  .  doxycycline (VIBRA-TABS) 100 MG tablet, Take 1 tablet (100 mg total) by mouth 2 (two) times daily., Disp: 20 tablet, Rfl: 0 .  ketoconazole (NIZORAL) 200 MG tablet, Take 200 mg by mouth daily., Disp: , Rfl: 0 .  levETIRAcetam (KEPPRA XR) 500 MG 24 hr tablet, Take by mouth., Disp: , Rfl:  .  loratadine (CLARITIN) 10 MG tablet, Take by mouth., Disp: , Rfl:  .  mupirocin ointment (BACTROBAN) 2 %, Apply 1 application topically 2 (two) times daily., Disp: 30 g, Rfl: 2 .  Omega-3 1000 MG CAPS, Take 1 g by mouth., Disp: , Rfl:  .  oxyCODONE (OXY IR/ROXICODONE) 5 MG immediate release tablet, Take 5 mg by mouth., Disp: , Rfl:  .  propranolol (INDERAL) 40 MG tablet, Take by mouth., Disp: , Rfl:  .  ranitidine (ZANTAC) 150 MG tablet, Take by mouth., Disp: , Rfl:  .  senna-docusate (SENOKOT-S) 8.6-50 MG tablet, Take by mouth., Disp: , Rfl:  .  silver sulfADIAZINE (SILVADENE) 1 %  cream, Apply 1 application topically daily., Disp: 50 g, Rfl: 0 .  traMADol (ULTRAM) 50 MG tablet, TAKE 1 TABLET (50 MG TOTAL) BY MOUTH EVERY 6 (SIX) HOURS AS NEEDED FOR UP TO 5 DAYS., Disp: , Rfl:    Patients Current Diet: Diet Regular with thin liquids / Dysphagia noted oropharyngeal phase. Meds crushed with puree   Precautions / Restrictions Precautions: Fall Precautions/Special Needs: Orthotics/Bracing Precaution  Comments: seizure precautions Weight Bearing Restrictions: No     Has the patient had 2 or more falls or a fall with injury in the past year? No   Prior Activity Level  Mod I with occasional use of RW in the home and BLE AFOs. Independent with adls. Used wheelchair in the community. Drove car with handicapped controls   Prior Functional Level Self Care: Did the patient need help bathing, dressing, using the toilet or eating? Independent   Indoor Mobility: Did the patient need assistance with walking from room to room (with or without device)? Independent   Stairs: Did the patient need assistance with internal or external stairs (with or without device)? Independent   Functional Cognition: Did the patient need help planning regular tasks such as shopping or remembering to take medications? Independent   Home Assistive Devices / Teacher, adult education (specify type) (BLE AFOS)     Prior Device Use: Indicate devices/aids used by the patient prior to current illness, exacerbation or injury? Manual wheelchair and Walker     Prior Functional Level Current Functional Level  Bed Mobility    Independent  max assist of 2 for rolling and supine to sit     Transfers    Independent    max assist of 2 for slide board transfers to and from wheelchair. performed sitting balance, therex, and therapeutic activity both in room and in gym on therapy mat. Patient able to hold himself in sitting with supervision only in short sit edge of mat with posterior props on hands with min to mod assist  requiring 2 rest breaks due to fatigue.Completed session of 98 minutes on 05/03/2020. Attempted to roll onto stomach but unable to fully clear RUE and position pt 2/2 fixed cervical flexion    Mobility - Walk/Wheelchair    Mod I with RW and furniture surfing with BLE AFOS    n/a    Upper Body Dressing    Independent    LUE lacks active wrist extension against gravity, use of long opponens splint. Right shoulder stronger than left . Needs use of long splints and universal cuff component . Lacks UE strength to bring hand to mouth for ADL. Atrophy noted throughout. Max dependent Needs max verbal cues/encouragement  Lower Body Dressing    Independent    total dependent    Grooming    Independent    total dependent    Eating/Drinking    Independent    use of splint an universal cuff    Toilet Transfer    Independent    max to total    Bladder Continence     Continent    condom catheter    Bowel Management    Continent    incontinent with bowel program in process    Stair Climbing    not done    unable    Communication    Independent    intact    Memory    Independent  intact      Special Needs/ Care Considerations   Condom catheter for bladder management Incontinent bowel with bowel program initiated Bilateral Meda boots RUE long opponens splint and LUE short opponens splint with 4 hrs on and 4 hrs off schedule Universal cuff for assist with feeding. Left hand dominant but typically feed with right hand Hearing loss right ear Wears glasses Left CN 6 palsy Legally blind left eye Designated visitors are his Mom, [name] and her spouse, Konrad Dolores and patient's wife Johny Chess. Parents are  main visitors and Wife to come weekly. [name] with recent surgery and unable to visit as much. Parents likely primary caregivers at discharge [place] bed/low air loss bed   Previous Home Environment  Living Arrangements: Spouse/significant other  Lives With: Spouse Available Help at  Discharge: Family;Friend(s);Available 24 hours/day Type of Home: House Home Layout: One level Home Access: Stairs to enter Entrance Stairs-Rails: Right Entrance Stairs-Number of Steps: 4 Bathroom Shower/Tub: Chiropodist: Standard Bathroom Accessibility: Yes How Accessible: Accessible via walker Home Care Services: No   Discharge Living Setting Plans for Discharge Living Setting: Lives with (comment) (to go to stay with parents in their home) Type of Home at Discharge: House Discharge Home Layout: One level Discharge Home Access: [place] entrance Discharge Bathroom Shower/Tub: [place] unit Discharge Bathroom Toilet: Standard Discharge Bathroom Accessibility: Yes How Accessible: Accessible via walker Does the patient have any problems obtaining your medications?: No   Social/Family/Support Systems Patient Roles: Spouse Contact Information: Mom Lennie Muckle and wife, Johny Chess Anticipated Caregiver: parents and wife Anticipated Caregiver's Contact Information: Vedia Pereyra [phone] and Johny Chess [phone] Ability/Limitations of Caregiver: parents are 47 but very physically able; wife is a Pharmacist, hospital but recently had surgery with physical limitations week 4 of 6 week limitation Caregiver Availability: 24/7 Discharge Plan Discussed with Primary Caregiver: Yes Is Caregiver In Agreement with Plan?: Yes Does Caregiver/Family have Issues with Lodging/Transportation while Pt is in Rehab?: No   Goals Patient/Family Goal for Rehab: mod assist with PT and OT, supervision SLP at wheelchair level Expected length of stay: ELOS 3 to 4 weeks Additional Information: Hospitalized since 02/04/2020 Pt/Family Agrees to Admission and willing to participate: Yes Program Orientation Provided & Reviewed with Pt/Caregiver Including Roles  & Responsibilities: Yes   Decrease burden of Care through IP rehab admission: n/a   Possible need for SNF placement upon discharge: family do  not want to pursue SNF after CIR, but it is an option if patient does not reach level to be able to direct discharge home. Family are hopeful for patient to discharge to his parents home.   Patient Condition: I have reviewed medical records from [facility] , spoken with CSW, and patient, spouse and family member. I discussed via phone for inpatient rehabilitation assessment.  Patient will benefit from ongoing PT, OT and SLP, can actively participate in 3 hours of therapy a day 5 days of the week, and can make measurable gains during the admission.  Patient will also benefit from the coordinated team approach during an Inpatient Acute Rehabilitation admission.  The patient will receive intensive therapy as well as Rehabilitation physician, nursing, social worker, and care management interventions.  Due to bladder management, bowel management, safety, skin/wound care, disease management, medication administration, pain management and patient education the patient requires 24 hour a day rehabilitation nursing. The patient is currently max assist to total assist overall  with mobility and basic ADLs.  Discharge setting and therapy post discharge at home with home health is anticipated.  Patient has agreed to participate in the Acute Inpatient Rehabilitation Program and will admit today.   Preadmission Screen Completed By:  Cleatrice Burke, 05/09/2020 9:48 AM ______________________________________________________________________   Discussed status with Dr. Dagoberto Ligas on  05/09/2020 at 0950 and received approval for admission today.   Admission Coordinator:  Cleatrice Burke, RN, time 7622 Date  05/09/2020    Assessment/Plan: Diagnosis: 1. Does the need for close, 24 hr/day Medical supervision in concert with the patient's rehab needs make it  unreasonable for this patient to be served in a less intensive setting? Yes 2. Co-Morbidities requiring supervision/potential complications:  neurofibromatosis, partial blindness, dysphagia, incomplete quadriplegia, seizures 3. Due to bladder management, bowel management, safety, skin/wound care, disease management, medication administration, pain management and patient education, does the patient require 24 hr/day rehab nursing? Yes 4. Does the patient require coordinated care of a physician, rehab nurse, PT, OT, and SLP to address physical and functional deficits in the context of the above medical diagnosis(es)? Yes Addressing deficits in the following areas: balance, endurance, locomotion, transferring, bowel/bladder control, bathing, dressing, feeding, grooming, toileting and swallowing 5. Can the patient actively participate in an intensive therapy program of at least 3 hrs of therapy 5 days a week? Yes 6. The potential for patient to make measurable gains while on inpatient rehab is good and fair 7. Anticipated functional outcomes upon discharge from inpatient rehab: supervision and min assist PT, supervision and min assist OT, modified independent and supervision SLP 8. Estimated rehab length of stay to reach the above functional goals is: 3-4 weeks 9. Anticipated discharge destination: Home 10. Overall Rehab/Functional Prognosis: good and fair   MD Signature **        Cosigned by: Courtney Heys, MD at 05/09/2020 10:48 AM  Revision History                          Note Details  Author Cristina Gong, RN File Time 05/09/2020 10:08 AM  Author Type Rehab Admission Coordinator Status Signed  Last Editor Courtney Heys, MD Service Physical Medicine and [place] # [id number] Admit Date 05/09/2020

## 2020-05-09 NOTE — Progress Notes (Signed)
Patient arrived on the unit via stretcher with Arboriculturist. Was transferred into the bed from the stretcher.  Condom catheter intact and draining. Nurse and tech at bedside.  Patient had bowel movement that was cleaned and noted in the chart.  Alert and responsive.  Answered all questions appropriately.  Stated that his parents would be arriving shortly.  No obvious signs or symptoms of acute distress or discomfort.

## 2020-05-10 ENCOUNTER — Inpatient Hospital Stay (HOSPITAL_COMMUNITY): Payer: Medicare Other

## 2020-05-10 ENCOUNTER — Inpatient Hospital Stay (HOSPITAL_COMMUNITY): Payer: Medicare Other | Admitting: Physical Therapy

## 2020-05-10 ENCOUNTER — Inpatient Hospital Stay (HOSPITAL_COMMUNITY): Payer: Medicare Other | Admitting: Occupational Therapy

## 2020-05-10 ENCOUNTER — Inpatient Hospital Stay (HOSPITAL_COMMUNITY): Payer: Medicare Other | Admitting: Speech Pathology

## 2020-05-10 DIAGNOSIS — M7989 Other specified soft tissue disorders: Secondary | ICD-10-CM

## 2020-05-10 LAB — CBC WITH DIFFERENTIAL/PLATELET
Abs Immature Granulocytes: 0.55 10*3/uL — ABNORMAL HIGH (ref 0.00–0.07)
Basophils Absolute: 0.1 10*3/uL (ref 0.0–0.1)
Basophils Relative: 1 %
Eosinophils Absolute: 0 10*3/uL (ref 0.0–0.5)
Eosinophils Relative: 0 %
HCT: 40.5 % (ref 39.0–52.0)
Hemoglobin: 13 g/dL (ref 13.0–17.0)
Immature Granulocytes: 5 %
Lymphocytes Relative: 10 %
Lymphs Abs: 1.1 10*3/uL (ref 0.7–4.0)
MCH: 27.4 pg (ref 26.0–34.0)
MCHC: 32.1 g/dL (ref 30.0–36.0)
MCV: 85.3 fL (ref 80.0–100.0)
Monocytes Absolute: 0.7 10*3/uL (ref 0.1–1.0)
Monocytes Relative: 6 %
Neutro Abs: 9.4 10*3/uL — ABNORMAL HIGH (ref 1.7–7.7)
Neutrophils Relative %: 78 %
Platelets: 327 10*3/uL (ref 150–400)
RBC: 4.75 MIL/uL (ref 4.22–5.81)
RDW: 15.5 % (ref 11.5–15.5)
WBC: 11.9 10*3/uL — ABNORMAL HIGH (ref 4.0–10.5)
nRBC: 0 % (ref 0.0–0.2)

## 2020-05-10 LAB — COMPREHENSIVE METABOLIC PANEL
ALT: 87 U/L — ABNORMAL HIGH (ref 0–44)
AST: 40 U/L (ref 15–41)
Albumin: 3.1 g/dL — ABNORMAL LOW (ref 3.5–5.0)
Alkaline Phosphatase: 107 U/L (ref 38–126)
Anion gap: 12 (ref 5–15)
BUN: 23 mg/dL — ABNORMAL HIGH (ref 6–20)
CO2: 22 mmol/L (ref 22–32)
Calcium: 9.4 mg/dL (ref 8.9–10.3)
Chloride: 102 mmol/L (ref 98–111)
Creatinine, Ser: 0.33 mg/dL — ABNORMAL LOW (ref 0.61–1.24)
GFR calc Af Amer: >60 mL/min (ref >60)
GFR calc non Af Amer: >60 mL/min (ref >60)
Glucose, Bld: 101 mg/dL — ABNORMAL HIGH (ref 70–99)
Potassium: 5.7 mmol/L — ABNORMAL HIGH (ref 3.5–5.1)
Sodium: 136 mmol/L (ref 135–145)
Total Bilirubin: 1.7 mg/dL — ABNORMAL HIGH (ref 0.3–1.2)
Total Protein: 6.2 g/dL — ABNORMAL LOW (ref 6.5–8.1)

## 2020-05-10 MED ORDER — DEXAMETHASONE 4 MG PO TABS
4.0000 mg | ORAL_TABLET | Freq: Two times a day (BID) | ORAL | Status: DC
  Administered 2020-05-11 – 2020-05-12 (×3): 4 mg via ORAL
  Filled 2020-05-10 (×3): qty 1

## 2020-05-10 NOTE — Progress Notes (Signed)
Lower extremity venous has been completed.   Preliminary results in CV Proc.   Abram Sander 05/10/2020 3:26 PM

## 2020-05-10 NOTE — Progress Notes (Signed)
Inpatient Rehabilitation  Patient information reviewed and entered into eRehab system by Florenda Watt M. Derius Ghosh, M.A., CCC/SLP, PPS Coordinator.  Information including medical coding, functional ability and quality indicators will be reviewed and updated through discharge.    

## 2020-05-10 NOTE — Evaluation (Signed)
Occupational Therapy Assessment and Plan  Patient Details  Name: Cameron Hale [name]: [id number] Date of Birth: 06-28-1976  OT Diagnosis: abnormal posture, muscle weakness (generalized) and impaired sensation, quadriplegia Rehab Potential: Rehab Potential (ACUTE ONLY): Fair ELOS: 24- 27 days   Today's Date: 05/10/2020 OT Individual Time: 6222-9798 OT Individual Time Calculation (min): 24 min    50 missed minutes secondary to lethargy  Hospital Problem: Principal Problem:   Quadriplegia ([place]) Active Problems:   NF2 (neurofibromatosis 2) ([place])   Incomplete paraplegia ([place])   Past Medical History:  Past Medical History:  Diagnosis Date  . NF2 (neurofibromatosis 2) ([place])    Past Surgical History:  Past Surgical History:  Procedure Laterality Date  . brain bleed     mri 2009  . HAND EXPLORATION    . LUMBAR FUSION    . NEPHRECTOMY     Partial left nephrectomy   . SPINAL FUSION      Assessment & Plan Clinical Impression: Patient is a 44 y.o. year old male with history of neurofibromatosis type II followed by Dr. Prince Rome neurosurgery at [facility], seizure disorder, hard of hearing, legally blind on the left, multiple prior surgeries for meningioma neurofibroma resection.  Per chart review patient lives with wife.  Modified independent prior to admission using bilateral AFOs.  Wife is currently limited physically.  His parents plan to assist on discharge.Sister locally with good support.  He will be discharged home with his parents.  1 level home with ramped entry.  Patient with recent  C5-7 PCF 02/04/2020 for resection of dural based spinal tumor he was initially discharged to inpatient rehab services 08/08/1940 complicated by CSF leak returning back to the hospital requiring wound revision on 02/20/2020 who was discharged to [facility] on 02/22/2020 and experienced exam decline with fever and speech difficulties as well as lower extremity weakness on 02/29/2020 and was  readmitted to the neurosurgical floor due to concern for subclinical seizure and altered mental status he was transferred to the neuro ICU and maintained on Keppra.  He also remained on Decadron protocol.  Patient was later transferred back to the neurosurgical floor with seizures controlled mental status continued to wax and wane.  He remained on triple antibiotic therapy of vancomycin cefepime and Flagyl times a total of 8 weeks completed 04/28/2020 and all antibiotics since discontinued.  Hospital course complicated by hyponatremia and maintained sodium chloride tablets.Latest Sodium 136 per father.  Patient with neurogenic bowel and bladder and working to establish bowel program.  He was cleared to begin subcutaneous heparin for DVT prophylaxis.  His diet has been advanced to regular.  Recommendations were made for referral to intense comprehensive rehabilitation program.  Patient was admitted for a comprehensive rehab program. .  Patient transferred to CIR on 05/09/2020 .    Patient currently requires dependent- Total of 2 with basic self-care skills secondary to muscle weakness, decreased cardiorespiratoy endurance and decreased sitting balance, decreased standing balance, decreased postural control and decreased balance strategies.  Prior to hospitalization, patient could complete ADLs  with modified independent .  Patient will benefit from skilled intervention to decrease level of assist with basic self-care skills prior to discharge home with care partner.  Anticipate patient will require 24 hour supervision and moderate physical assestance and follow up home health.  OT - End of Session Activity Tolerance: Decreased this session Endurance Deficit: Yes Endurance Deficit Description: lethargy OT Assessment Rehab Potential (ACUTE ONLY): Fair OT Barriers to Discharge: Medical stability OT Patient  demonstrates impairments in the following area(s): Behavior;Balance;Endurance;Motor;Safety OT Basic  ADL's Functional Problem(s): Eating;Grooming;Bathing;Dressing;Toileting OT Transfers Functional Problem(s): Toilet OT Additional Impairment(s): None OT Plan OT Intensity: Minimum of 1-2 x/day, 45 to 90 minutes OT Frequency: 5 out of 7 days OT Duration/Estimated Length of Stay: 24- 27 days OT Treatment/Interventions: Balance/vestibular training;Self Care/advanced ADL retraining;UE/LE Coordination activities;Functional mobility training;Visual/perceptual remediation/compensation;Community reintegration;Wheelchair propulsion/positioning;Splinting/orthotics;Discharge planning;Therapeutic Activities;Pain management;Patient/family education;Therapeutic Exercise;DME/adaptive equipment instruction;Psychosocial support;UE/LE Strength taining/ROM OT Self Feeding Anticipated Outcome(s): min A OT Basic Self-Care Anticipated Outcome(s): Mod A OT Toileting Anticipated Outcome(s): Max A OT Bathroom Transfers Anticipated Outcome(s): Mod A - max A OT Recommendation Recommendations for Other Services: Neuropsych consult Patient destination: Home Follow Up Recommendations: Home health OT;24 hour supervision/assistance Equipment Recommended: To be determined   Skilled Therapeutic Intervention Upon entering the room, pt supine in bed with mouth and eyes opened. OT attempting to engage pt in therapeutic intervention and education but pt only grunts. Pt very lethargic during session and keeps eyes closed while therapist is in the room. Pt requires total A for bed mobility and is unable to be directed or offer assistance secondary to lethargy this session. OT notified RN. OT repositioned pt for comfort and safety. 50 missed minutes secondary to pt unable to participate secondary to lethargy   OT Evaluation Precautions/Restrictions  Precautions Precautions: Fall Precaution Comments: seizure precautions Restrictions Weight Bearing Restrictions: No General OT Amount of Missed Time: 50 Minutes  Pain Pain  Assessment Pain Scale: Faces Pain Score: 0-No pain Faces Pain Scale: No hurt Pain Type: Acute pain;Chronic pain Home Living/Prior Functioning Home Living Family/patient expects to be discharged to:: Private residence Living Arrangements: Spouse/significant other Available Help at Discharge: Family, Friend(s), Available 24 hours/day Type of Home: House Home Access: Stairs to enter, Electrical engineer of Steps: 4 Entrance Stairs-Rails: Right Home Layout: One level Additional Comments: plan to d/c to parents house with ramped entrance  Lives With: Spouse, Family Prior Function Level of Independence: Independent with gait, Independent with transfers, Requires assistive device for independence  Able to Take Stairs?: Yes Driving: Yes Comments: Mod I with use of RW in home and wheelchair for community; Used Bilateral LE AFOS; drives with handicapped controls; disabled Perception  Perception: Within Functional Limits Praxis Praxis: Intact Cognition Overall Cognitive Status: Within Functional Limits for tasks assessed Arousal/Alertness: Lethargic Orientation Level:  (lethargic) Year:  (lethargic unable to answer) Month:  (lethargic unable to answer) Memory: Appears intact Attention: Focused Focused Attention: Appears intact Awareness: Appears intact Problem Solving: Appears intact Safety/Judgment: Appears intact Sensation Sensation Light Touch: Impaired Detail Light Touch Impaired Details: Absent RLE;Impaired LLE Proprioception: Impaired Detail Proprioception Impaired Details: Impaired RLE;Impaired LLE Coordination Gross Motor Movements are Fluid and Coordinated: No Fine Motor Movements are Fluid and Coordinated: No Coordination and Movement Description: impaired 2/2 incomplete quadraplegia Motor  Motor Motor: Tetraplegia;Abnormal tone;Abnormal postural alignment and control Motor - Skilled Clinical Observations: impaired 2/2 incomplete  quadraplegia Mobility  Bed Mobility Bed Mobility: Rolling Right;Rolling Left Rolling Right: Dependent - Patient equal 0% Rolling Left: Dependent - Patient equal 0% Supine to Sit: 2 Helpers Sit to Supine: 2 Helpers  Trunk/Postural Assessment  Cervical Assessment Cervical Assessment: Exceptions to [facility] (forward head) Thoracic Assessment Thoracic Assessment: Exceptions to [facility] (kyphotic) Lumbar Assessment Lumbar Assessment: Exceptions to [facility] (posterior pelvic tilt) Postural Control Postural Control: Deficits on evaluation  Balance Balance Balance Assessed: Yes Static Sitting Balance Static Sitting - Balance Support: No upper extremity supported;Feet supported Static Sitting - Level of Assistance: 2: Max assist Dynamic  Sitting Balance Dynamic Sitting - Balance Support: No upper extremity supported;Feet supported;During functional activity Dynamic Sitting - Level of Assistance: 1: +2 Total assist Extremity/Trunk Assessment RUE Assessment Passive Range of Motion (PROM) Comments: WFLs General Strength Comments: unable to formally assess secondary to lethargy LUE Assessment LUE Assessment: Exceptions to [facility] Passive Range of Motion (PROM) Comments: WFLs General Strength Comments: unable to formally assess secondary to lethargy     Refer to Care Plan for Long Term Goals  Recommendations for other services: Neuropsych   Discharge Criteria: Patient will be discharged from OT if patient refuses treatment 3 consecutive times without medical reason, if treatment goals not met, if there is a change in medical status, if patient makes no progress towards goals or if patient is discharged from hospital.  The above assessment, treatment plan, treatment alternatives and goals were discussed and mutually agreed upon: by patient  Gypsy Decant 05/10/2020, 5:21 PM

## 2020-05-10 NOTE — Evaluation (Signed)
Speech Language Pathology Assessment and Plan  Patient Details  Name: Cameron Hale [name]: [id number] Date of Birth: 09-02-1976  SLP Diagnosis: Speech and Language deficits;Dysphagia  Rehab Potential: Good ELOS: 24-28 days    Today's Date: 05/10/2020 SLP Individual Time: 9485-4627 SLP Individual Time Calculation (min): 55 min   Hospital Problem: Principal Problem:   Quadriplegia ([place]) Active Problems:   NF2 (neurofibromatosis 2) (Cameron Hale)   Incomplete paraplegia (Cameron Hale)  Past Medical History:  Past Medical History:  Diagnosis Date  . NF2 (neurofibromatosis 2) (Cameron Hale)    Past Surgical History:  Past Surgical History:  Procedure Laterality Date  . brain bleed     mri 2009  . HAND EXPLORATION    . LUMBAR FUSION    . NEPHRECTOMY     Partial left nephrectomy   . SPINAL FUSION      Assessment / Plan / Recommendation Clinical Impression Patient 44 year old left-handed male with history of neurofibromatosis type II followed by Dr. Prince Hale [facility]surgery [facility], seizure disorder, hard of hearing, legally blind on the left, multiple prior surgeries for meningioma neurofibroma resection.  Per chart review patient lives with wife. Modified independent prior to admission using bilateral AFOs.  Wife is currently limited physically. His parents plan to assist on discharge.Sister locally with good support.  He will be discharged home with his parents.  1 level home with ramped entry.  Patient with recent  C5-7 PCF 02/04/2020 for resection of dural based spinal tumor he was initially discharged to inpatient rehab services 0/35/0093 complicated by CSF leak returning back to the hospital requiring wound revision on 02/20/2020 who was discharged to [facility] on 02/22/2020 and experienced exam decline with fever and speech difficulties as well as lower extremity weakness on 02/29/2020 and was readmitted to the neurosurgical floor due to concern for subclinical seizure and altered  mental status he was transferred to the neuro ICU and maintained on Keppra.  He also remained on Decadron protocol. Patient was later transferred back to the neurosurgical floor with seizures controlled mental status continued to wax and wane.  He remained on triple antibiotic therapy of vancomycin cefepime and Flagyl times a total of 8 weeks completed 04/28/2020 and all antibiotics since discontinued.  Hospital course complicated by hyponatremia and maintained sodium chloride tablets.Latest Sodium 136 per father.  Patient with neurogenic bowel and bladder and working to establish bowel program.  He was cleared to begin subcutaneous heparin for DVT prophylaxis.  His diet has been advanced to regular. Recommendations were made for referral to intense comprehensive rehabilitation program.  Patient was admitted for a comprehensive rehab program 05/09/20.  Patient administered a BSE and demonstrates prolonged mastication and prolonged oral transit with solid textures. Patient reports extra caution with PO intake due to reduce strength and inability to cough effectively. No overt s/s of aspiration noted with thin liquids. Recommend patient downgrade to Dys. 3 textures to maximize ease of mastication as well as energy conservation. Patient verbalized understanding and agreement. The patient's cognitive-linguistic function appeared [facility] for all tasks assessed with both the patient and his mother reporting that he is at his baseline level of cognitive functioning. Patient demonstrates mild deficits in intelligibility due to a low vocal intensity, suspect patient may benefit from RMT. Patient would benefit from skilled SLP intervention to maximize his swallowing and speech function prior to discharge.    Skilled Therapeutic Interventions          Administered a BSE and a cognitive-linguistic evaluation, please see above  for details.   SLP Assessment  Patient will need skilled Speech Lanaguage Pathology Services during CIR  admission    Recommendations  SLP Diet Recommendations: Dysphagia 3 (Mech soft);Thin Liquid Administration via: Straw Medication Administration: Crushed with puree Supervision: Trained caregiver to feed patient Compensations: Minimize environmental distractions;Slow rate;Small sips/bites Postural Changes and/or Swallow Maneuvers: Seated upright 90 degrees Oral Care Recommendations: Oral care BID Recommendations for Other Services: Neuropsych consult Patient destination: Home Follow up Recommendations: 24 hour supervision/assistance Equipment Recommended: None recommended by SLP    SLP Frequency 3 to 5 out of 7 days   SLP Duration  SLP Intensity  SLP Treatment/Interventions 24-28 days  Minumum of 1-2 x/day, 30 to 90 minutes  Dysphagia/aspiration precaution training;Speech/Language facilitation;Therapeutic Activities;Environmental controls;Cueing hierarchy;Functional tasks;Patient/family education    Pain No/Denies Pain   Prior Functioning Type of Home: House  Lives With: Spouse;Family Available Help at Discharge: Family;Friend(s);Available 24 hours/day  SLP Evaluation Cognition Overall Cognitive Status: Within Functional Limits for tasks assessed Arousal/Alertness: Awake/alert Orientation Level: Oriented X4 Attention: Focused Focused Attention: Appears intact Memory: Appears intact Awareness: Appears intact Problem Solving: Appears intact Safety/Judgment: Appears intact  Comprehension Auditory Comprehension Overall Auditory Comprehension: Appears within functional limits for tasks assessed Visual Recognition/Discrimination Discrimination: Not tested Reading Comprehension Reading Status: Not tested Expression Expression Primary Mode of Expression: Verbal Verbal Expression Overall Verbal Expression: Appears within functional limits for tasks assessed Oral Motor Oral Motor/Sensory Function Overall Oral Motor/Sensory Function: Generalized oral weakness Motor  Speech Overall Motor Speech: Impaired Respiration: Impaired Level of Impairment: Phrase Phonation: Low vocal intensity Resonance: Within functional limits Articulation: Within functional limitis Intelligibility: Intelligibility reduced Word: 75-100% accurate Phrase: 75-100% accurate Sentence: 75-100% accurate Conversation: 75-100% accurate Motor Planning: Witnin functional limits Effective Techniques: Increased vocal intensity;Over-articulate   PMSV Assessment  PMSV Trial Intelligibility: Intelligibility reduced Word: 75-100% accurate Phrase: 75-100% accurate Sentence: 75-100% accurate Conversation: 75-100% accurate  Bedside Swallowing Assessment General Date of Onset: 02/04/20 Previous Swallow Assessment: BSE at [facility]: Regular textures with thin liquids Diet Prior to this Study: Thin liquids;Regular Temperature Spikes Noted: No Respiratory Status: Room air Behavior/Cognition: Alert;Cooperative Oral Cavity - Dentition: Adequate natural dentition Self-Feeding Abilities: Total assist Patient Positioning: Upright in chair/Tumbleform Baseline Vocal Quality: Low vocal intensity Volitional Cough: Weak Volitional Swallow: Able to elicit  Ice Chips Ice chips: Not tested Thin Liquid Thin Liquid: Impaired Presentation: Straw Pharyngeal  Phase Impairments: Suspected delayed Swallow Nectar Thick Nectar Thick Liquid: Not tested Honey Thick Honey Thick Liquid: Not tested Puree Puree: Impaired Presentation: Spoon Oral Phase Functional Implications: Prolonged oral transit Solid Solid: Impaired Presentation: Spoon Oral Phase Impairments: Impaired mastication Oral Phase Functional Implications: Prolonged oral transit;Impaired mastication BSE Assessment Risk for Aspiration Impact on safety and function: Mild aspiration risk Other Related Risk Factors: Deconditioning  Short Term Goals: Week 1: SLP Short Term Goal 1 (Week 1): Patient will consume current diet with minimal  overt s/s of aspiration with overall supervision level verbal cues. SLP Short Term Goal 2 (Week 1): Patient will participate in RMT testing  Refer to Care Plan for Long Term Goals  Recommendations for other services: Neuropsych  Discharge Criteria: Patient will be discharged from SLP if patient refuses treatment 3 consecutive times without medical reason, if treatment goals not met, if there is a change in medical status, if patient makes no progress towards goals or if patient is discharged from hospital.  The above assessment, treatment plan, treatment alternatives and goals were discussed and mutually agreed upon: by patient and family   Quandarius Nill,  Knox Holdman 05/10/2020, 3:59 PM

## 2020-05-10 NOTE — Evaluation (Signed)
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 Prince Rome neurosurgery at [facility], seizure disorder, hard of hearing, legally blind on the left, multiple prior surgeries for meningioma neurofibroma resection.  Per chart review patient lives with wife.  Modified independent prior to admission using bilateral AFOs.  Wife is currently limited physically.  His parents plan to assist on discharge.Sister locally with good support.  He will be discharged home with his parents.  1 level home with ramped entry.  Patient with recent  C5-7 PCF 02/04/2020 for resection of dural based spinal tumor he was initially discharged to inpatient rehab services 02/24/8118 complicated by CSF leak returning back to the hospital requiring wound revision on 02/20/2020 who was discharged to inpatient rehab/[facility] on 02/22/2020 and experienced exam decline with fever and speech difficulties as well as lower extremity weakness on 02/29/2020 and was readmitted to the neurosurgical floor due to concern for subclinical  seizure and altered mental status he was transferred to the neuro ICU and maintained on Keppra.  He also remained on Decadron protocol.  Patient was later transferred back to the neurosurgical floor with seizures controlled mental status continued to wax and wane.  He remained on triple antibiotic therapy of vancomycin cefepime and Flagyl times a total of 8 weeks completed 04/28/2020 and all antibiotics since discontinued.  Hospital course complicated by hyponatremia and maintained sodium chloride tablets.Latest Sodium 136 per father.  Patient with neurogenic bowel and bladder and working to establish bowel program.  He was cleared to begin subcutaneous heparin for DVT prophylaxis.  His diet has been advanced to regular.  Recommendations were made for referral to intense comprehensive rehabilitation program.  Patient was admitted for a comprehensive rehab program. Patient transferred to CIR on 05/09/2020 .   Patient currently requires total with mobility secondary to muscle weakness and muscle paralysis, decreased cardiorespiratoy endurance, abnormal tone and unbalanced muscle activation and decreased sitting balance, decreased postural control and decreased balance strategies.  Prior to hospitalization, patient was modified independent  with mobility and lived with Spouse, Family in a House home.  Home access is 4Stairs to enter, Ramped entrance.  Patient will benefit from skilled PT intervention to maximize safe functional mobility, minimize fall risk and decrease caregiver burden for planned discharge home with 24 hour assist.  Anticipate patient will benefit from follow up [facility] at discharge.  PT - End of Session Activity Tolerance: Tolerates 30+ min activity with multiple rests Endurance Deficit: Yes Endurance Deficit Description: frequent rest breaks during functional activity PT Assessment Rehab Potential (ACUTE/IP  ONLY): Fair PT Barriers to Discharge: Medical stability;Incontinence;Neurogenic Bowel &  Bladder PT Patient demonstrates impairments in the following area(s): Balance;Endurance;Motor;Safety;Sensory;Skin Integrity PT Transfers Functional Problem(s): Bed Mobility;Bed to Chair;Car;Furniture;Floor PT Locomotion Functional Problem(s): Ambulation;Wheelchair Mobility;Stairs PT Plan PT Intensity: Minimum of 1-2 x/day ,45 to 90 minutes PT Frequency: 5 out of 7 days PT Duration Estimated Length of Stay: 24-28 days PT Treatment/Interventions: Balance/vestibular training;Community reintegration;Discharge planning;Disease management/prevention;DME/adaptive equipment instruction;Functional electrical stimulation;Functional mobility training;Neuromuscular re-education;Pain management;Patient/family education;Psychosocial support;Splinting/orthotics;Therapeutic Activities;Therapeutic Exercise;UE/LE Strength taining/ROM;UE/LE Coordination activities;Wheelchair propulsion/positioning PT Transfers Anticipated Outcome(s): mod A PT Locomotion Anticipated Outcome(s): mod A with LRAD PT Recommendation Recommendations for Other Services: Neuropsych consult;Therapeutic Recreation consult Therapeutic Recreation Interventions: Stress management Follow Up Recommendations: Home health PT;24 hour supervision/assistance Patient destination: Home Equipment Recommended: To be determined Equipment Details: TBD pending progress  Skilled Therapeutic Intervention Evaluation completed (see details above and below) with education on PT POC and goals and individual treatment initiated with focus on bed mobility, functional transfer assessment, orientation to rehab unit and schedule, and setting pt up with appropriate equipment. Pt received semi-reclined in bed, agreeable to PT evaluation. No complaints of pain. Pt is dependent to don TED hose and pants at bed level. Pt is max to total A for rolling R/L in order to pull pants up over hips. Semi-reclined to sitting EOB with assist x 2 for LE management and trunk elevation.  Pt requires max A for sitting balance EOB due to posterior bias in sitting. Pt initially reports feeling light-headed when seated EOB but quickly resolves. Slide board transfer bed to w/c with assist x 2. Assisted pt with doffing hospital gown and donning scrub top while seated in w/c. Pt's mother present and end of session and updated on current status. Pt left semi-reclined in TIS w/c in room with needs in reach, quick release belt and chair alarm in place at end of session.  PT Evaluation Precautions/Restrictions Precautions Precautions: Fall Precaution Comments: seizure precautions Restrictions Weight Bearing Restrictions: No Home Living/Prior Functioning Home Living Available Help at Discharge: Family;Friend(s);Available 24 hours/day Type of Home: House Home Access: Stairs to enter;Ramped entrance Entrance Stairs-Number of Steps: 4 Entrance Stairs-Rails: Right Home Layout: One level Additional Comments: plan to d/c to parents house with ramped entrance  Lives With: Spouse;Family Prior Function Level of Independence: Independent with gait;Independent with transfers;Requires assistive device for independence  Able to Take Stairs?: Yes Driving: Yes Comments: Mod I with use of RW in home and wheelchair for community; Used Bilateral LE AFOS; drives with handicapped controls; disabled Vision/Perception  Vision - History Baseline Vision: Legally blind (in L eye) Patient Visual Report: No change from baseline Perception Perception: Within Functional Limits Praxis Praxis: Intact  Cognition Overall Cognitive Status: Within Functional Limits for tasks assessed Arousal/Alertness: Awake/alert Orientation Level: Oriented X4 Attention: Focused Focused Attention: Appears intact Memory: Appears intact Awareness: Appears intact Problem Solving: Appears intact Safety/Judgment: Appears intact Sensation Sensation Light Touch: Impaired Detail Light Touch Impaired Details: Absent  RLE;Impaired LLE Proprioception: Impaired Detail Proprioception Impaired Details: Impaired RLE;Impaired LLE Coordination Gross Motor Movements are Fluid and Coordinated: No Fine Motor Movements are Fluid and Coordinated: No Coordination and Movement Description: impaired 2/2 incomplete quadraplegia Motor  Motor Motor: Tetraplegia;Abnormal tone;Abnormal postural alignment and control Motor - Skilled Clinical Observations: impaired 2/2 incomplete quadraplegia  Mobility Bed Mobility Bed Mobility: Rolling Right;Rolling Left;Supine to Sit;Sit to Supine Rolling Right: Maximal Assistance - Patient 25-49% Rolling Left: Maximal Assistance - Patient 25-49% Supine to Sit: 2 Helpers Sit to Supine: 2 Helpers Transfers Transfers:  Lateral/Scoot Transfers Lateral/Scoot Transfers: 2 Press photographer (Assistive device): Other (Comment) (slide board) Artist / Additional Locomotion Stairs: No Wheelchair Mobility Wheelchair Mobility: No  Trunk/Postural Assessment  Cervical Assessment Cervical Assessment: Exceptions to [facility] (forward head) Thoracic Assessment Thoracic Assessment: Exceptions to [facility] (kyphotic) Lumbar Assessment Lumbar Assessment: Exceptions to [facility] (posterior pelvic tilt) Postural Control Postural Control: Deficits on evaluation (delayed/insufficient)  Balance Balance Balance Assessed: Yes Static Sitting Balance Static Sitting - Balance Support: No upper extremity supported;Feet supported Static Sitting - Level of Assistance: 2: Max assist Dynamic Sitting Balance Dynamic Sitting - Balance Support: No upper extremity supported;Feet supported;During functional activity Dynamic Sitting - Level of Assistance: 1: +2 Total assist Extremity Assessment   RLE Assessment RLE Assessment: Exceptions to [facility] General Strength Comments: impaired, see below RLE Strength Right Hip Flexion: 1/5 Right Knee Flexion: 0/5 Right Knee Extension: 0/5 Right Ankle Dorsiflexion: 0/5 LLE  Assessment LLE Assessment: Exceptions to [facility] General Strength Comments: impaired, see below LLE Strength Left Hip Flexion: 1/5 Left Knee Flexion: 0/5 Left Knee Extension: 1/5 Left Ankle Dorsiflexion: 0/5    Refer to Care Plan for Long Term Goals  Recommendations for other services: Neuropsych and Therapeutic Recreation  Stress management  Discharge Criteria: Patient will be discharged from PT if patient refuses treatment 3 consecutive times without medical reason, if treatment goals not met, if there is a change in medical status, if patient makes no progress towards goals or if patient is discharged from hospital.  The above assessment, treatment plan, treatment alternatives and goals were discussed and mutually agreed upon: by patient and by family   Excell Seltzer, PT, DPT 05/10/2020, 3:28 PM

## 2020-05-10 NOTE — Progress Notes (Signed)
[facility] PHYSICAL MEDICINE & REHABILITATION PROGRESS NOTE   Subjective/Complaints:  Pt reports no pain- doing well- LBM last night he thinks (had 1 after got here yesterday).  Also, slept well- has condom catheter.   Spoke with Dr Prince Rome- able to wean steroids. Also K+ 5.7- but was hemolyzed, so will recheck in AM   ROS:  Pt denies SOB, abd pain, CP, N/V/C/D, and vision changes   Objective:   VAS [place] LOWER EXTREMITY VENOUS (DVT)  Result Date: 05/10/2020  Lower Venous DVTStudy Indications: Edema.  Comparison Study: no prior Performing Technologist: Abram Sander RVS  Examination Guidelines: A complete evaluation includes B-mode imaging, spectral Doppler, color Doppler, and power Doppler as needed of all accessible portions of each vessel. Bilateral testing is considered an integral part of a complete examination. Limited examinations for reoccurring indications may be performed as noted. The reflux portion of the exam is performed with the patient in reverse Trendelenburg.  +---------+---------------+---------+-----------+----------+--------------+  RIGHT     Compressibility Phasicity Spontaneity Properties Thrombus Aging  +---------+---------------+---------+-----------+----------+--------------+  CFV       Full            Yes       Yes                                    +---------+---------------+---------+-----------+----------+--------------+  SFJ       Full                                                             +---------+---------------+---------+-----------+----------+--------------+  FV Prox   Full                                                             +---------+---------------+---------+-----------+----------+--------------+  FV Mid    Full                                                             +---------+---------------+---------+-----------+----------+--------------+  FV Distal Full                                                              +---------+---------------+---------+-----------+----------+--------------+  PFV       Full                                                             +---------+---------------+---------+-----------+----------+--------------+  POP       Full  Yes       Yes                                    +---------+---------------+---------+-----------+----------+--------------+  PTV       Full                                                             +---------+---------------+---------+-----------+----------+--------------+  PERO      Full                                                             +---------+---------------+---------+-----------+----------+--------------+   +---------+---------------+---------+-----------+----------+--------------+  LEFT      Compressibility Phasicity Spontaneity Properties Thrombus Aging  +---------+---------------+---------+-----------+----------+--------------+  CFV       Full            Yes       Yes                                    +---------+---------------+---------+-----------+----------+--------------+  SFJ       Full                                                             +---------+---------------+---------+-----------+----------+--------------+  FV Prox   Full                                                             +---------+---------------+---------+-----------+----------+--------------+  FV Mid    Full                                                             +---------+---------------+---------+-----------+----------+--------------+  FV Distal Full                                                             +---------+---------------+---------+-----------+----------+--------------+  PFV       Full                                                             +---------+---------------+---------+-----------+----------+--------------+  POP       Full            Yes       Yes                                     +---------+---------------+---------+-----------+----------+--------------+  PTV       Full                                                             +---------+---------------+---------+-----------+----------+--------------+  PERO      Full                                                             +---------+---------------+---------+-----------+----------+--------------+     Summary: BILATERAL: - No evidence of deep vein thrombosis seen in the lower extremities, bilaterally. -No evidence of popliteal cyst, bilaterally.   *See table(s) above for measurements and observations. Electronically signed by Curt Jews MD on 05/10/2020 at 3:39:53 PM.    Final    Recent Labs    05/09/20 1440 05/10/20 0431  WBC 14.2* 11.9*  HGB 11.8* 13.0  HCT 36.5* 40.5  PLT 319 327   Recent Labs    05/09/20 1440 05/10/20 0431  NA  --  136  K  --  5.7*  CL  --  102  CO2  --  22  GLUCOSE  --  101*  BUN  --  23*  CREATININE 0.33* 0.33*  CALCIUM  --  9.4    Intake/Output Summary (Last 24 hours) at 05/10/2020 1844 Last data filed at 05/10/2020 1829 Gross per 24 hour  Intake 420 ml  Output 350 ml  Net 70 ml     Physical Exam: Vital Signs Blood pressure (!) 139/96, pulse 83, temperature (!) 97.5 F (36.4 C), resp. rate 16, height (P) 5\' 10"  (1.778 m), weight (P) 61.5 kg, SpO2 100 %.   Constitutional:  pt sitting in w/c with staff in room/PT, appropriate, NAD HENT:  Blind L eye- still decreased lateral movement R eye Cardiovascular: RRR Respiratory: CTA B/L- no W/R/R- good air movement GI: Soft, NT, ND, (+)BS  Genitourinary:    Testes and penis normal.     Genitourinary Comments: Condom catheter in place draining light amber urine   Musculoskeletal:     Cervical back: Normal range of motion and neck supple. No rigidity.     Comments: Full strength exam done RUE- biceps 4-/5, WE 2-/5, triceps 1/5, grip 2-/5, finger abd 1/5 LUE- biceps 3/5, WE 2-/5, triceps 2-/5, grip 1/5, finger abd 1/5 RLE- HF,  KE, DF, PF all 1/5 LLE- HF 2-/5; otherwise 0/5 in LLE  Neurological: Pt is alert, appropriate, vague, Ox2 Hoffman's in RUE- not LUE No clonus; MAS of 1 in UEs and 1+ in LEs Sensation to light touch impaired from C5 downwards- gets less notable as goes down torso/legs. - Skin: Skin is warm and dry.  Tiny, tiny area that's pink around anus- blanchable- skin tear- not open Psychiatric: Mood normal.  Assessment/Plan: 1. 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  Physiatrist is providing close team supervision and 24 hour management of active medical problems listed below.  Physiatrist and rehab team continue to assess barriers to discharge/monitor patient progress toward functional and medical goals  Care Tool:  Bathing  Bathing activity did not occur: Safety/medical concerns           Bathing assist       Upper Body Dressing/Undressing Upper body dressing   What is the patient wearing?: Pull over shirt    Upper body assist Assist Level: Total Assistance - Patient < 25%    Lower Body Dressing/Undressing Lower body dressing      What is the patient wearing?: Incontinence brief     Lower body assist Assist for lower body dressing: Dependent - Patient 0%     Toileting Toileting Toileting Activity did not occur (Clothing management and hygiene only): N/A (no void or bm)  Toileting assist Assist for toileting: 2 Helpers     Transfers Chair/bed transfer  Transfers assist     Chair/bed transfer assist level: Dependent - mechanical lift     Locomotion Ambulation   Ambulation assist   Ambulation activity did not occur: Safety/medical concerns          Walk 10 feet activity   Assist  Walk 10 feet activity did not occur: Safety/medical concerns        Walk 50 feet activity   Assist Walk 50 feet with  2 turns activity did not occur: Safety/medical concerns         Walk 150 feet activity   Assist Walk 150 feet activity did not occur: Safety/medical concerns         Walk 10 feet on uneven surface  activity   Assist Walk 10 feet on uneven surfaces activity did not occur: Safety/medical concerns         Wheelchair     Assist Will patient use wheelchair at discharge?: Yes Type of Wheelchair:  (TBD) Wheelchair activity did not occur: Safety/medical concerns         Wheelchair 50 feet with 2 turns activity    Assist    Wheelchair 50 feet with 2 turns activity did not occur: Safety/medical concerns       Wheelchair 150 feet activity     Assist  Wheelchair 150 feet activity did not occur: Safety/medical concerns       Blood pressure (!) 139/96, pulse 83, temperature (!) 97.5 F (36.4 C), resp. rate 16, height (P) 5\' 10"  (1.778 m), weight (P) 61.5 kg, SpO2 100 %.  Medical Problem List and Plan: 1.  Decreased functional mobility/C4 ASIA B/C myelopathy with neurogenic bowel and bladder and mild spasticity secondary to neurofibromatosis type II with multiple prior surgeries for meningioma neurofibroma resection and most recent resection of dural based spinal tumor 03/27/2584 complicated by CSF leak wound revision.  Antibiotic therapy completed 04/28/2020- parents say 6/14- PICC out             -patient may  shower             -ELOS/Goals: goals min-mod A- 3-4 weeks 2.  Antithrombotics: -DVT/anticoagulation: Subcutaneous heparin.  Check vascular study 6/23- (-) for DVTs             -antiplatelet therapy: n/a 3. Pain Management: Baclofen 10 mg twice daily for spasticity 4.  Mood: not on any meds             -antipsychotic agents: n/a 5. Neuropsych: This patient is somewhat capable of making decisions on his own behalf. 6. Skin/Wound Care: no open skin areas- however has an area of irritation near anus- Stage I but TINY- will monitor 7.  Fluids/Electrolytes/Nutrition: regular diet with thin liquids- need SLP for dysarthria/dysphagia and cognition 8.  Neurogenic bowel and bladder.  Suppository nightly as needed, MiraLAX daily.  Check PVR's  6/23- PVRs ordered, but don't see recorded in chart- will order again, since need to know if pt retaining- having many incontinent stools- will d/w pt/parents about putting on bowel program.  9.  Seizure disorder.  Keppra 1000 mg twice daily, Inderal 10 mg twice daily 10.  Hyponatremia.  Continue sodium chloride tablets 3 times daily.  Follow-up chemistries- last Na 136 per father  6/23- Na 136- con't salt tabs 11. Neurofibromatosis-2- was getting steroids- per Dr Prince Rome can reduce by 20% every few days;  -will also try and contact Dr Shaaron Adler about possible additional chemotherapy- Dr Prince Rome was CLEAR can use estim, since not cancerous dx.   6/23- will wean Decadron to 4 mg q12 hours from q8 hours for 2 days, then con't to wean.  12. Impaired function             -will order grey call bell for pt.  13. Hyperkalemia  6/23- K+ 5.7- hemolyzed- will recheck in AM    LOS: 1 days A FACE TO FACE EVALUATION WAS PERFORMED  Nicolasa Milbrath 05/10/2020, 6:44 PM

## 2020-05-11 ENCOUNTER — Inpatient Hospital Stay (HOSPITAL_COMMUNITY): Payer: Medicare Other | Admitting: Speech Pathology

## 2020-05-11 ENCOUNTER — Inpatient Hospital Stay (HOSPITAL_COMMUNITY): Payer: Medicare Other

## 2020-05-11 ENCOUNTER — Encounter (HOSPITAL_COMMUNITY): Payer: Self-pay | Admitting: Physical Medicine and Rehabilitation

## 2020-05-11 ENCOUNTER — Inpatient Hospital Stay (HOSPITAL_COMMUNITY): Payer: Medicare Other | Admitting: Physical Therapy

## 2020-05-11 LAB — BASIC METABOLIC PANEL
Anion gap: 11 (ref 5–15)
BUN: 25 mg/dL — ABNORMAL HIGH (ref 6–20)
CO2: 24 mmol/L (ref 22–32)
Calcium: 9.4 mg/dL (ref 8.9–10.3)
Chloride: 103 mmol/L (ref 98–111)
Creatinine, Ser: 0.51 mg/dL — ABNORMAL LOW (ref 0.61–1.24)
GFR calc Af Amer: >60 mL/min (ref >60)
GFR calc non Af Amer: >60 mL/min (ref >60)
Glucose, Bld: 100 mg/dL — ABNORMAL HIGH (ref 70–99)
Potassium: 4.3 mmol/L (ref 3.5–5.1)
Sodium: 138 mmol/L (ref 135–145)

## 2020-05-11 NOTE — Plan of Care (Signed)
  Problem: Consults Goal: RH GENERAL PATIENT EDUCATION Description: Patient will be aware of his plan of care and what his progress goals are while in rehab.  Outcome: Progressing Goal: Skin Care Protocol Initiated - if Braden Score 18 or less Description: If consults are not indicated, leave blank or document N/A Outcome: Progressing Goal: Nutrition Consult-if indicated Outcome: Progressing Goal: Diabetes Guidelines if Diabetic/Glucose > 140 Description: If diabetic or lab glucose is > 140 mg/dl - Initiate Diabetes/Hyperglycemia Guidelines & Document Interventions  Outcome: Progressing   Problem: RH BOWEL ELIMINATION Goal: RH STG MANAGE BOWEL WITH ASSISTANCE Description: STG Manage Bowel with Assistance. Outcome: Progressing Goal: RH STG MANAGE BOWEL W/MEDICATION W/ASSISTANCE Description: STG Manage Bowel with Medication with Assistance. Outcome: Progressing   Problem: RH BLADDER ELIMINATION Goal: RH STG MANAGE BLADDER WITH ASSISTANCE Description: STG Manage Bladder With Assistance Outcome: Progressing Goal: RH STG MANAGE BLADDER WITH MEDICATION WITH ASSISTANCE Description: STG Manage Bladder With Medication With Assistance. Outcome: Progressing Goal: RH STG MANAGE BLADDER WITH EQUIPMENT WITH ASSISTANCE Description: STG Manage Bladder With Equipment With Assistance Outcome: Progressing   Problem: RH SKIN INTEGRITY Goal: RH STG MAINTAIN SKIN INTEGRITY WITH ASSISTANCE Description: STG Maintain Skin Integrity With Assistance. Outcome: Progressing Goal: RH STG ABLE TO PERFORM INCISION/WOUND CARE W/ASSISTANCE Description: STG Able To Perform Incision/Wound Care With Assistance. Outcome: Progressing   Problem: RH SAFETY Goal: RH STG ADHERE TO SAFETY PRECAUTIONS W/ASSISTANCE/DEVICE Description: STG Adhere to Safety Precautions With Assistance/Device. Outcome: Progressing Goal: RH STG DECREASED RISK OF FALL WITH ASSISTANCE Description: STG Decreased Risk of Fall With  Assistance. Outcome: Progressing

## 2020-05-11 NOTE — Progress Notes (Signed)
Occupational Therapy Session Note  Patient Details  Name: MARSHAUN LORTIE [name]: [id number] Date of Birth: [birth year]  Today's Date: 05/11/2020 OT Individual Time: 1300-1330 OT Individual Time Calculation (min): 30 min    Short Term Goals: Week 1:  OT Short Term Goal 1 (Week 1): Pt will perform UB self care with max A. OT Short Term Goal 2 (Week 1): Pt will maintain static sitting balance with max A on EOB for 3 minutes. OT Short Term Goal 3 (Week 1): Pt will utilized AE and modifications as needed for self feeding with mod A.  Skilled Therapeutic Interventions/Progress Updates:    Pt resting in w/c upon arrival.  OT intervention with focus on BUE shoulder AROM/AAROM and R wrist AAROM. Pt states prior to current hospitalization he was able to feed self and had functional ROM in wrist and hand. Absent R wrist flexion/extension. R shoulder AAROM to 90 degrees. Clarified orders for B PRAFO at night and sheet posted in room. Plan to use wrist support with universal cuff on RUE when in stock. Pt remained in w/c with all needs within reach and belt alarm activated.   Therapy Documentation Precautions:  Precautions Precautions: Fall Precaution Comments: seizure precautions Restrictions Weight Bearing Restrictions: No  Pain:  Pt denies pain this afternoon   Therapy/Group: Individual Therapy  Leroy Libman 05/11/2020, 2:48 PM

## 2020-05-11 NOTE — Progress Notes (Signed)
[facility] PHYSICAL MEDICINE & REHABILITATION PROGRESS NOTE   Subjective/Complaints:  Pt reports no issues- still having bowel incontinence and using condom catheter-  D/w father and pt about possible bowel program and how it works- takes 3-6 weeks to form "habit" but usually very effective.    ROS:  Pt denies SOB, abd pain, CP, N/V/C/D, and vision changes   Objective:   VAS [place] LOWER EXTREMITY VENOUS (DVT)  Result Date: 05/10/2020  Lower Venous DVTStudy Indications: Edema.  Comparison Study: no prior Performing Technologist: Abram Sander RVS  Examination Guidelines: A complete evaluation includes B-mode imaging, spectral Doppler, color Doppler, and power Doppler as needed of all accessible portions of each vessel. Bilateral testing is considered an integral part of a complete examination. Limited examinations for reoccurring indications may be performed as noted. The reflux portion of the exam is performed with the patient in reverse Trendelenburg.  +---------+---------------+---------+-----------+----------+--------------+ RIGHT    CompressibilityPhasicitySpontaneityPropertiesThrombus Aging +---------+---------------+---------+-----------+----------+--------------+ CFV      Full           Yes      Yes                                 +---------+---------------+---------+-----------+----------+--------------+ SFJ      Full                                                        +---------+---------------+---------+-----------+----------+--------------+ FV Prox  Full                                                        +---------+---------------+---------+-----------+----------+--------------+ FV Mid   Full                                                        +---------+---------------+---------+-----------+----------+--------------+ FV DistalFull                                                         +---------+---------------+---------+-----------+----------+--------------+ PFV      Full                                                        +---------+---------------+---------+-----------+----------+--------------+ POP      Full           Yes      Yes                                 +---------+---------------+---------+-----------+----------+--------------+ PTV      Full                                                        +---------+---------------+---------+-----------+----------+--------------+  PERO     Full                                                        +---------+---------------+---------+-----------+----------+--------------+   +---------+---------------+---------+-----------+----------+--------------+ LEFT     CompressibilityPhasicitySpontaneityPropertiesThrombus Aging +---------+---------------+---------+-----------+----------+--------------+ CFV      Full           Yes      Yes                                 +---------+---------------+---------+-----------+----------+--------------+ SFJ      Full                                                        +---------+---------------+---------+-----------+----------+--------------+ FV Prox  Full                                                        +---------+---------------+---------+-----------+----------+--------------+ FV Mid   Full                                                        +---------+---------------+---------+-----------+----------+--------------+ FV DistalFull                                                        +---------+---------------+---------+-----------+----------+--------------+ PFV      Full                                                        +---------+---------------+---------+-----------+----------+--------------+ POP      Full           Yes      Yes                                  +---------+---------------+---------+-----------+----------+--------------+ PTV      Full                                                        +---------+---------------+---------+-----------+----------+--------------+ PERO     Full                                                        +---------+---------------+---------+-----------+----------+--------------+  Summary: BILATERAL: - No evidence of deep vein thrombosis seen in the lower extremities, bilaterally. -No evidence of popliteal cyst, bilaterally.   *See table(s) above for measurements and observations. Electronically signed by Curt Jews MD on 05/10/2020 at 3:39:53 PM.    Final    Recent Labs    05/09/20 1440 05/10/20 0431  WBC 14.2* 11.9*  HGB 11.8* 13.0  HCT 36.5* 40.5  PLT 319 327   Recent Labs    05/10/20 0431 05/11/20 0519  NA 136 138  K 5.7* 4.3  CL 102 103  CO2 22 24  GLUCOSE 101* 100*  BUN 23* 25*  CREATININE 0.33* 0.51*  CALCIUM 9.4 9.4    Intake/Output Summary (Last 24 hours) at 05/11/2020 1135 Last data filed at 05/11/2020 0836 Gross per 24 hour  Intake 720 ml  Output --  Net 720 ml     Physical Exam: Vital Signs Blood pressure (!) 127/115, pulse 87, temperature (!) 96.9 F (36.1 C), resp. rate 16, height (P) 5\' 10"  (1.778 m), weight (P) 61.5 kg, SpO2 100 %.   Constitutional:  pt sitting up in tilt in space manual w/c, NAD HENT:  Blind L eye- still decreased lateral movement R eye- no change Cardiovascular: RRR Respiratory: CTA B/L- no W/R/R- good air movement GI: Soft, NT, ND, (+)BS .     Genitourinary Comments: Condom catheter in place draining light amber urine   Musculoskeletal:     Cervical back: Normal range of motion and neck supple. No rigidity.     Comments: Full strength exam done RUE- biceps 4-/5, WE 2-/5, triceps 1/5, grip 2-/5, finger abd 1/5 LUE- biceps 3/5, WE 2-/5, triceps 2-/5, grip 1/5, finger abd 1/5 RLE- HF, KE, DF, PF all 1/5 LLE- HF 2-/5; otherwise 0/5 in  LLE  Neurological: Pt is alert, appropriate, vague, Ox2 Hoffman's in RUE- not LUE No clonus; MAS of 1 in UEs and 1+ in LEs Sensation to light touch impaired from C5 downwards- gets less notable as goes down torso/legs. - Skin: Skin is warm and dry.  Tiny, tiny area that's pink around anus- blanchable- skin tear- not open Psychiatric: bright affect- vague    Assessment/Plan: 1. 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  Physiatrist is providing close team supervision and 24 hour management of active medical problems listed below.  Physiatrist and rehab team continue to assess barriers to discharge/monitor patient progress toward functional and medical goals  Care Tool:  Bathing  Bathing activity did not occur: Safety/medical concerns     Body parts bathed by helper: Right arm, Left arm, Chest, Abdomen, Front perineal area, Buttocks, Right upper leg, Left upper leg, Right lower leg, Left lower leg, Face     Bathing assist Assist Level: Total Assistance - Patient < 25%     Upper Body Dressing/Undressing Upper body dressing   What is the patient wearing?: Pull over shirt    Upper body assist Assist Level: Dependent - Patient 0%    Lower Body Dressing/Undressing Lower body dressing      What is the patient wearing?: Incontinence brief, Pants     Lower body assist Assist for lower body dressing: Dependent - Patient 0%     Toileting Toileting Toileting Activity did not occur (Clothing management and hygiene only): N/A (no void or bm)  Toileting assist Assist for toileting: Dependent - Patient 0%  Transfers Chair/bed transfer  Transfers assist     Chair/bed transfer assist level: Dependent - mechanical lift     Locomotion Ambulation   Ambulation assist   Ambulation activity did not occur: Safety/medical  concerns          Walk 10 feet activity   Assist  Walk 10 feet activity did not occur: Safety/medical concerns        Walk 50 feet activity   Assist Walk 50 feet with 2 turns activity did not occur: Safety/medical concerns         Walk 150 feet activity   Assist Walk 150 feet activity did not occur: Safety/medical concerns         Walk 10 feet on uneven surface  activity   Assist Walk 10 feet on uneven surfaces activity did not occur: Safety/medical concerns         Wheelchair     Assist Will patient use wheelchair at discharge?: Yes Type of Wheelchair: Manual (TBD) Wheelchair activity did not occur: Safety/medical concerns         Wheelchair 50 feet with 2 turns activity    Assist    Wheelchair 50 feet with 2 turns activity did not occur: Safety/medical concerns       Wheelchair 150 feet activity     Assist  Wheelchair 150 feet activity did not occur: Safety/medical concerns       Blood pressure (!) 127/115, pulse 87, temperature (!) 96.9 F (36.1 C), resp. rate 16, height (P) 5\' 10"  (1.778 m), weight (P) 61.5 kg, SpO2 100 %.  Medical Problem List and Plan: 1.  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  Antibiotic therapy completed 04/28/2020- parents say 6/14- PICC out             -patient may  shower             -ELOS/Goals: goals min-mod A- 3-4 weeks 2.  Antithrombotics: -DVT/anticoagulation: Subcutaneous heparin.  Check vascular study 6/23- (-) for DVTs             -antiplatelet therapy: n/a 3. Pain Management: Baclofen 10 mg twice daily for spasticity 4. Mood: not on any meds             -antipsychotic agents: n/a 5. Neuropsych: This patient is  somewhat capable of making decisions on his own behalf. 6. Skin/Wound Care: no open skin areas- however has an area of irritation near anus- Stage I but TINY- will monitor 7. Fluids/Electrolytes/Nutrition: regular diet with thin liquids- need SLP for dysarthria/dysphagia and cognition 8.  Neurogenic bowel and bladder.  Suppository nightly as needed, MiraLAX daily.  Check PVR's  6/23- PVRs ordered, but don't see recorded in chart- will order again, since need to know if pt retaining- having many incontinent stools- will d/w pt/parents about putting on bowel program.   6/24- d/w pt and father about bowel program - 2nd time saw pt- discussed at length. They will think about it.  9.  Seizure disorder.  Keppra 1000 mg twice daily, Inderal 10 mg twice daily 10.  Hyponatremia.  Continue sodium chloride tablets 3 times daily.  Follow-up chemistries- last Na 136 per father  6/23- Na 136- con't salt tabs 11. Neurofibromatosis-2- was getting steroids- per Dr Prince Rome can reduce by 20%  every few days;  -will also try and contact Dr Shaaron Adler about possible additional chemotherapy- Dr Prince Rome was CLEAR can use estim, since not cancerous dx.   6/23- will wean Decadron to 4 mg q12 hours from q8 hours for 2 days, then con't to wean.  12. Impaired function             -will order grey call bell for pt.  13. Hyperkalemia  6/23- K+ 5.7- hemolyzed- will recheck in AM  6/24- K= 4.3  I spent a total of 30 minutes on total care today- mainly discussing bowel program.     LOS: 2 days A FACE TO FACE EVALUATION WAS PERFORMED  Howard Patton 05/11/2020, 11:35 AM

## 2020-05-11 NOTE — Progress Notes (Signed)
Physical Therapy Session Note  Patient Details  Name: Cameron Hale [name]: [id number] Date of Birth: 1975/12/08  Today's Date: 05/11/2020 PT Individual Time: 1412-1507 PT Individual Time Calculation (min): 55 min   Short Term Goals: Week 1:  PT Short Term Goal 1 (Week 1): Pt will complete bed mobility with assist x 1 consistently PT Short Term Goal 2 (Week 1): Pt will maintain static sitting balance with mod A x 5 min PT Short Term Goal 3 (Week 1): Pt will initiate w/c mobility as safe and able  Skilled Therapeutic Interventions/Progress Updates:    Pt received sitting in TIS w/c with his wife present and pt agreeable to therapy session.  Transported to/from gym in w/c for time management and energy conservation. Pt reports his L hemibody is more impaired than his R - pt unable to initiate movement in either LE to move them when therapist donning/doffing w/c leg rests. R lateral scoot transfer w/c>EOM using transfer board with total assist for board placement and total assist for scooting - pt requires +2 total assist for trunk control during transfer due to anterior or posterior LOB - cuing for head/hips relationship and manual facilitation for UE positioning to aid with trunk control. Session focused on sitting EOM trunk control training focus on sustaining static sitting in midline - pt having posterior or anterior LOB requiring total assist to stop LOB and max assist to return to midline (more difficulty coming forward from posterior LOB compared to coming backwards from anterior LOB) - pt able to sustain static sitting in midline with B UE support on mat for 40seconds. Progressed to trying to maintain midline sitting while performing minimal UE reaching to external targets with pt demonstrating ability to reach R hand towards targets within BOS with less LOB compared to when attempting to reach with L hand. Requires 2 supported sitting rest breaks during sitting EOM tasks. RN requesting therapist  assist patient back to bed at end of session. L lateral scoot transfer EOM>TIS w/c using transfer board with total assist for board placement and +2 total assist for trunk control and scooting during transfer - same cuing as above. Transported back to room and performed R lateral scoot transfer to EOB as described above. Sit>supine with +2 total assist for trunk control and B LE management. Pt left supine in bed with needs in reach and pt reporting being incontinent of bowels - nursing staff notified to assist patient.  Therapy Documentation Precautions:  Precautions Precautions: Fall Precaution Comments: seizure precautions Restrictions Weight Bearing Restrictions: No  Pain:   Reports his bottom is sore from sitting - returned to supine at end of session for pressure relief - pt using tilt in space wheelchair for pressure relief when OOB.    Therapy/Group: Individual Therapy  Tawana Scale, PT, DPT 05/11/2020, 3:11 PM

## 2020-05-11 NOTE — Progress Notes (Signed)
Occupational Therapy Session Note  Patient Details  Name: Cameron Hale [name]: [id number] Date of Birth: 28-Jun-1976  Today's Date: 05/11/2020 OT Individual Time: 0900-1000 OT Individual Time Calculation (min): 60 min    Short Term Goals: Week 1:  OT Short Term Goal 1 (Week 1): Pt will perform UB self care with max A. OT Short Term Goal 2 (Week 1): Pt will maintain static sitting balance with max A on EOB for 3 minutes. OT Short Term Goal 3 (Week 1): Pt will utilized AE and modifications as needed for self feeding with mod A.  Skilled Therapeutic Interventions/Progress Updates:    Pt resting in bed upon arrival.  OT intervention with focus on bed mobility, dressing at bed level and seated in w/c, SB transfers, and BUE function to assist with BADLs. Pt incontinent of bowel (smear) requiring tot A for hygiene.  Pt required max A for rolling R/L in bed. Pt dependent for LB dressing at bed level. Static sitting balance EOB with min A and slight posterior lean noted. SB transfer with tot A+2 to w/c. Pt dependent for doffing/donning paper scrub shirt. Pt attempted to bathe face but decreased strength and AROM prevented from successful attempt.  Pt dependent for UB bathing tasks. Pt remained seated in TIS w/c with all needs within reach and belt alarm activated.   Therapy Documentation Precautions:  Precautions Precautions: Fall Precaution Comments: seizure precautions Restrictions Weight Bearing Restrictions: No  Pain: Pain Assessment Pain Scale: 0-10 Pain Score: 0-No pain  Therapy/Group: Individual Therapy  Leroy Libman 05/11/2020, 9:25 AM

## 2020-05-11 NOTE — Care Management (Signed)
[facility] Individual Statement of Services  Patient Name:  Cameron Hale  Date:  05/11/2020  Welcome to the [facility].  Our goal is to provide you with an individualized program based on your diagnosis and situation, designed to meet your specific needs.  With this comprehensive rehabilitation program, you will be expected to participate in at least 3 hours of rehabilitation therapies Monday-Friday, with modified therapy programming on the weekends.  Your rehabilitation program will include the following services:  Physical Therapy (PT), Occupational Therapy (OT), Speech Therapy (ST), 24 hour per day rehabilitation nursing, Therapeutic Recreaction (TR), Psychology, Neuropsychology, Care Coordinator, Rehabilitation Medicine, Nutrition Services, Pharmacy Services and Other  Weekly team conferences will be held on Tuesdays to discuss your progress.  Your Inpatient Rehabilitation Care Coordinator will talk with you frequently to get your input and to update you on team discussions.  Team conferences with you and your family in attendance may also be held.  Expected length of stay: 24-28 days   Overall anticipated outcome: Moderate Assistance  Depending on your progress and recovery, your program may change. Your Inpatient Rehabilitation Care Coordinator will coordinate services and will keep you informed of any changes. Your Inpatient Rehabilitation Care Coordinator's name and contact numbers are listed  below.  The following services may also be recommended but are not provided by the [facility] will be made to provide these services after discharge if needed.  Arrangements include referral to agencies that provide these services.  Your insurance has been verified to be:  Medicare  A/B  Your primary doctor is:  Donnie Coffin  Pertinent information will be shared with your doctor and your insurance company.  Inpatient Rehabilitation Care Coordinator:  Cathleen Corti [phone] or ([phone]  Information discussed with and copy given to patient by: Rana Snare, 05/11/2020, 2:02 PM

## 2020-05-11 NOTE — Plan of Care (Signed)
  Problem: Consults Goal: RH GENERAL PATIENT EDUCATION Description: Patient will be aware of his plan of care and what his progress goals are while in rehab.  Outcome: Progressing Goal: Skin Care Protocol Initiated - if Braden Score 18 or less Description: If consults are not indicated, leave blank or document N/A Outcome: Progressing Goal: Nutrition Consult-if indicated Outcome: Progressing Goal: Diabetes Guidelines if Diabetic/Glucose > 140 Description: If diabetic or lab glucose is > 140 mg/dl - Initiate Diabetes/Hyperglycemia Guidelines & Document Interventions  Outcome: Progressing   Problem: RH BOWEL ELIMINATION Goal: RH STG MANAGE BOWEL WITH ASSISTANCE Description: STG Manage Bowel with Assistance. Outcome: Progressing Goal: RH STG MANAGE BOWEL W/MEDICATION W/ASSISTANCE Description: STG Manage Bowel with Medication with Assistance. Outcome: Progressing   Problem: RH BLADDER ELIMINATION Goal: RH STG MANAGE BLADDER WITH ASSISTANCE Description: STG Manage Bladder With Assistance Outcome: Progressing Goal: RH STG MANAGE BLADDER WITH MEDICATION WITH ASSISTANCE Description: STG Manage Bladder With Medication With Assistance. Outcome: Progressing Goal: RH STG MANAGE BLADDER WITH EQUIPMENT WITH ASSISTANCE Description: STG Manage Bladder With Equipment With Assistance Outcome: Progressing   Problem: RH SKIN INTEGRITY Goal: RH STG SKIN FREE OF INFECTION/BREAKDOWN Outcome: Progressing Goal: RH STG MAINTAIN SKIN INTEGRITY WITH ASSISTANCE Description: STG Maintain Skin Integrity With Assistance. Outcome: Progressing Goal: RH STG ABLE TO PERFORM INCISION/WOUND CARE W/ASSISTANCE Description: STG Able To Perform Incision/Wound Care With Assistance. Outcome: Progressing   Problem: RH SAFETY Goal: RH STG ADHERE TO SAFETY PRECAUTIONS W/ASSISTANCE/DEVICE Description: STG Adhere to Safety Precautions With Assistance/Device. Outcome: Progressing Goal: RH STG DECREASED RISK OF FALL WITH  ASSISTANCE Description: STG Decreased Risk of Fall With Assistance. Outcome: Progressing   Problem: RH PAIN MANAGEMENT Goal: RH STG PAIN MANAGED AT OR BELOW PT'S PAIN GOAL Outcome: Progressing   Problem: RH KNOWLEDGE DEFICIT GENERAL Goal: RH STG INCREASE KNOWLEDGE OF SELF CARE AFTER HOSPITALIZATION Outcome: Progressing

## 2020-05-11 NOTE — Progress Notes (Signed)
Speech Language Pathology Daily Session Note  Patient Details  Name: Cameron Hale [name]: [id number] Date of Birth: January 10, 1976  Today's Date: 05/11/2020 SLP Individual Time: 1015-1055 SLP Individual Time Calculation (min): 40 min  Short Term Goals: Week 1: SLP Short Term Goal 1 (Week 1): Patient will consume current diet with minimal overt s/s of aspiration with overall supervision level verbal cues. SLP Short Term Goal 2 (Week 1): Patient will participate in RMT testing SLP Short Term Goal 2 - Progress (Week 1): Met SLP Short Term Goal 3 (Week 1): Patient will perform diaphramatic breathing exercises in supine with Max A multimodal cues SLP Short Term Goal 4 (Week 1): Patient will perform 5 repetitions of EMST at 5 cm H2O with Max A multimodal cues with a self-perceived effort level of 7/10 without reports of dizziness, etc.  Skilled Therapeutic Interventions: Skilled treatment session focused on speech goals. SLP facilitated session by administering RMT testing while patient was upright in the tilt-in-space wheelchair. Verbal consent was obtained by the physician.  Patient's peak MIP: 18 and patient's peak MEP: 14 which shows meaningful weakness. Patient performed 1 repetition of EMST at 5 cm H2O with Max A multimodal cues with intermittent reports of dizziness. SLP did not attempt IMST device to reports of intermittent adverse effects. Suspect RMT may be too high-level at this time, therefore, recommend starting at basic diaphragmatic breathing exercises in supine and re-attempt RMT as strength improves. Patient left upright in the wheelchair with all needs within reach and father present. Continue with current plan of care.      Pain Pain Assessment Pain Scale: 0-10 Pain Score: 0-No pain  Therapy/Group: Individual Therapy  Flonnie Wierman 05/11/2020, 11:03 AM

## 2020-05-12 ENCOUNTER — Inpatient Hospital Stay (HOSPITAL_COMMUNITY): Payer: Medicare Other

## 2020-05-12 ENCOUNTER — Inpatient Hospital Stay (HOSPITAL_COMMUNITY): Payer: Medicare Other | Admitting: Speech Pathology

## 2020-05-12 ENCOUNTER — Inpatient Hospital Stay (HOSPITAL_COMMUNITY): Payer: Medicare Other | Admitting: Physical Therapy

## 2020-05-12 MED ORDER — DEXAMETHASONE 2 MG PO TABS
2.0000 mg | ORAL_TABLET | Freq: Two times a day (BID) | ORAL | Status: DC
  Administered 2020-05-12 – 2020-05-14 (×4): 2 mg via ORAL
  Filled 2020-05-12 (×4): qty 1

## 2020-05-12 MED ORDER — BISACODYL 10 MG RE SUPP
10.0000 mg | Freq: Every day | RECTAL | Status: DC
  Administered 2020-05-12 – 2020-06-04 (×19): 10 mg via RECTAL
  Filled 2020-05-12 (×22): qty 1

## 2020-05-12 NOTE — Progress Notes (Signed)
Physical Therapy Session Note  Patient Details  Name: Cameron Hale [name]: [id number] Date of Birth: Sep 22, 1976  Today's Date: 05/12/2020 PT Individual Time: 1015-1100 PT Individual Time Calculation (min): 45 min   Short Term Goals: Week 1:  PT Short Term Goal 1 (Week 1): Pt will complete bed mobility with assist x 1 consistently PT Short Term Goal 2 (Week 1): Pt will maintain static sitting balance with mod A x 5 min PT Short Term Goal 3 (Week 1): Pt will initiate w/c mobility as safe and able  Skilled Therapeutic Interventions/Progress Updates:    Pt received seated in TIS w/c in room, agreeable to therapy session. Pt reports soreness in bottom from sitting up in chair that resolves with repositioning. Dependent transport via TIS w/c to/from therapy gym. Slide board transfer TIS to mat table to new TIS chair with total A +2 for transfer with pt requiring total A for trunk control during transfer. Pt is mod A for sitting balance EOM for trunk control. Set pt up with new TIS chair with more narrow seat for improved fit as well as with hybrid Roho and foam cushion for improved comfort and skin protection for prolonged sitting in chair. Once seated in new TIS chair pt reports improved comfort with use of new cushion. Adjusted head rest for improved comfort and positioning. Pt reports possible bowel incontinence. Slide board transfer back to bed with total A +2. Sit to supine total A +2 for trunk control and BLE management. Pt is mod A for rolling R/L with skilled cueing for UE and LE placement during transfer. Pt is dependent for pericare and brief change following incontinent BM. Pt left seated in bed with needs in reach at end of session.  Therapy Documentation Precautions:  Precautions Precautions: Fall Precaution Comments: seizure precautions Restrictions Weight Bearing Restrictions: No    Therapy/Group: Individual Therapy   Excell Seltzer, PT, DPT  05/12/2020, 12:31 PM

## 2020-05-12 NOTE — Progress Notes (Signed)
[facility] PHYSICAL MEDICINE & REHABILITATION PROGRESS NOTE   Subjective/Complaints:  Denies pain- moves a little better per pt.  Wants to start bowel program.   Discussed with family- but wants to start it.    ROS:  Pt denies SOB, abd pain, CP, N/V/C/D, and vision changes   Objective:   VAS [place] LOWER EXTREMITY VENOUS (DVT)  Result Date: 05/10/2020  Lower Venous DVTStudy Indications: Edema.  Comparison Study: no prior Performing Technologist: Abram Sander RVS  Examination Guidelines: A complete evaluation includes B-mode imaging, spectral Doppler, color Doppler, and power Doppler as needed of all accessible portions of each vessel. Bilateral testing is considered an integral part of a complete examination. Limited examinations for reoccurring indications may be performed as noted. The reflux portion of the exam is performed with the patient in reverse Trendelenburg.  +---------+---------------+---------+-----------+----------+--------------+  RIGHT     Compressibility Phasicity Spontaneity Properties Thrombus Aging  +---------+---------------+---------+-----------+----------+--------------+  CFV       Full            Yes       Yes                                    +---------+---------------+---------+-----------+----------+--------------+  SFJ       Full                                                             +---------+---------------+---------+-----------+----------+--------------+  FV Prox   Full                                                             +---------+---------------+---------+-----------+----------+--------------+  FV Mid    Full                                                             +---------+---------------+---------+-----------+----------+--------------+  FV Distal Full                                                             +---------+---------------+---------+-----------+----------+--------------+  PFV       Full                                                              +---------+---------------+---------+-----------+----------+--------------+  POP       Full            Yes       Yes                                    +---------+---------------+---------+-----------+----------+--------------+  PTV       Full                                                             +---------+---------------+---------+-----------+----------+--------------+  PERO      Full                                                             +---------+---------------+---------+-----------+----------+--------------+   +---------+---------------+---------+-----------+----------+--------------+  LEFT      Compressibility Phasicity Spontaneity Properties Thrombus Aging  +---------+---------------+---------+-----------+----------+--------------+  CFV       Full            Yes       Yes                                    +---------+---------------+---------+-----------+----------+--------------+  SFJ       Full                                                             +---------+---------------+---------+-----------+----------+--------------+  FV Prox   Full                                                             +---------+---------------+---------+-----------+----------+--------------+  FV Mid    Full                                                             +---------+---------------+---------+-----------+----------+--------------+  FV Distal Full                                                             +---------+---------------+---------+-----------+----------+--------------+  PFV       Full                                                             +---------+---------------+---------+-----------+----------+--------------+  POP       Full            Yes       Yes                                    +---------+---------------+---------+-----------+----------+--------------+  PTV       Full                                                              +---------+---------------+---------+-----------+----------+--------------+  PERO      Full                                                             +---------+---------------+---------+-----------+----------+--------------+     Summary: BILATERAL: - No evidence of deep vein thrombosis seen in the lower extremities, bilaterally. -No evidence of popliteal cyst, bilaterally.   *See table(s) above for measurements and observations. Electronically signed by Curt Jews MD on 05/10/2020 at 3:39:53 PM.    Final    Recent Labs    05/09/20 1440 05/10/20 0431  WBC 14.2* 11.9*  HGB 11.8* 13.0  HCT 36.5* 40.5  PLT 319 327   Recent Labs    05/10/20 0431 05/11/20 0519  NA 136 138  K 5.7* 4.3  CL 102 103  CO2 22 24  GLUCOSE 101* 100*  BUN 23* 25*  CREATININE 0.33* 0.51*  CALCIUM 9.4 9.4    Intake/Output Summary (Last 24 hours) at 05/12/2020 0853 Last data filed at 05/12/2020 0231 Gross per 24 hour  Intake 400 ml  Output 696 ml  Net -296 ml     Physical Exam: Vital Signs Blood pressure (!) 137/102, pulse 79, temperature 97.8 F (36.6 C), temperature source Oral, resp. rate 16, height (P) 5\' 10"  (1.778 m), weight (P) 61.5 kg, SpO2 100 %.   Constitutional:  pt sitting up in power w/c, NAD HENT:  Blind L eye- still decreased lateral movement R eye- no change Cardiovascular: RRR Respiratory: CTA B/L- no W/R/R- good air movement GI: Soft, NT, ND, (+)BS      Genitourinary Comments: Condom catheter in place draining light amber urine   Musculoskeletal:     Cervical back: Normal range of motion and neck supple. No rigidity.     Comments: Full strength exam done RUE- biceps 4-/5, WE 2-/5, triceps 1/5, grip 2-/5, finger abd 1/5 LUE- biceps 3/5, WE 2-/5, triceps 2-/5, grip 1/5, finger abd 1/5 RLE- HF, KE, DF, PF all 1/5 LLE- HF 2-/5; otherwise 0/5 in LLE  Neurological: Pt is alert, appropriate, vague, Ox2 Hoffman's in RUE- not LUE No clonus; MAS of 1 in UEs and 1+ in LEs Sensation to  light touch impaired from C5 downwards- gets less notable as goes down torso/legs. - Skin: Skin is warm and dry.  Tiny, tiny area that's pink around anus- blanchable- skin tear- not open Psychiatric: bright affect    Assessment/Plan: 1. 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  Physiatrist is providing close team supervision and 24 hour management of active medical problems listed below.  Physiatrist and rehab team continue to assess barriers to discharge/monitor patient progress toward functional and medical goals  Care Tool:  Bathing  Bathing activity did not occur: Safety/medical concerns     Body parts bathed by helper:  Right arm, Left arm, Chest, Abdomen, Front perineal area, Buttocks, Right upper leg, Left upper leg, Right lower leg, Left lower leg, Face     Bathing assist Assist Level: Total Assistance - Patient < 25%     Upper Body Dressing/Undressing Upper body dressing   What is the patient wearing?: Pull over shirt    Upper body assist Assist Level: Dependent - Patient 0%    Lower Body Dressing/Undressing Lower body dressing      What is the patient wearing?: Incontinence brief     Lower body assist Assist for lower body dressing: Dependent - Patient 0%     Toileting Toileting Toileting Activity did not occur (Clothing management and hygiene only): N/A (no void or bm)  Toileting assist Assist for toileting: 2 Helpers     Transfers Chair/bed transfer  Transfers assist     Chair/bed transfer assist level: 2 Helpers (+2 total assist slide board)     Locomotion Ambulation   Ambulation assist   Ambulation activity did not occur: Safety/medical concerns          Walk 10 feet activity   Assist  Walk 10 feet activity did not occur: Safety/medical concerns        Walk 50 feet  activity   Assist Walk 50 feet with 2 turns activity did not occur: Safety/medical concerns         Walk 150 feet activity   Assist Walk 150 feet activity did not occur: Safety/medical concerns         Walk 10 feet on uneven surface  activity   Assist Walk 10 feet on uneven surfaces activity did not occur: Safety/medical concerns         Wheelchair     Assist Will patient use wheelchair at discharge?: Yes Type of Wheelchair: Manual Wheelchair activity did not occur: Safety/medical concerns  Wheelchair assist level: Dependent - Patient 0% Max wheelchair distance: 161ft    Wheelchair 50 feet with 2 turns activity    Assist    Wheelchair 50 feet with 2 turns activity did not occur: Safety/medical concerns   Assist Level: Dependent - Patient 0%   Wheelchair 150 feet activity     Assist  Wheelchair 150 feet activity did not occur: Safety/medical concerns   Assist Level: Dependent - Patient 0%   Blood pressure (!) 137/102, pulse 79, temperature 97.8 F (36.6 C), temperature source Oral, resp. rate 16, height (P) 5\' 10"  (1.778 m), weight (P) 61.5 kg, SpO2 100 %.  Medical Problem List and Plan: 1.  Decreased functional mobility/C4 ASIA B/C myelopathy with neurogenic bowel and bladder and mild spasticity secondary to neurofibromatosis type II with multiple prior surgeries for meningioma neurofibroma resection and most recent resection of dural based spinal tumor 0/86/5784 complicated by CSF leak wound revision.  Antibiotic therapy completed 04/28/2020- parents say 6/14- PICC out             -patient may  shower             -ELOS/Goals: goals min-mod A- 3-4 weeks 2.  Antithrombotics: -DVT/anticoagulation: Subcutaneous heparin.  Check vascular study 6/23- (-) for DVTs             -antiplatelet therapy: n/a 3. Pain Management: Baclofen 10 mg twice daily for spasticity 4. Mood: not on any meds             -antipsychotic agents: n/a 5. Neuropsych: This  patient is somewhat capable of making decisions on his own behalf. 6.  Skin/Wound Care: no open skin areas- however has an area of irritation near anus- Stage I but TINY- will monitor 7. Fluids/Electrolytes/Nutrition: regular diet with thin liquids- need SLP for dysarthria/dysphagia and cognition 8.  Neurogenic bowel and bladder.  Suppository nightly as needed, MiraLAX daily.  Check PVR's  6/23- PVRs ordered, but don't see recorded in chart- will order again, since need to know if pt retaining- having many incontinent stools- will d/w pt/parents about putting on bowel program.   6/24- d/w pt and father about bowel program - 2nd time saw pt- discussed at length. They will think about it.  6/25- will start bowel program per pt request.  9.  Seizure disorder.  Keppra 1000 mg twice daily, Inderal 10 mg twice daily 10.  Hyponatremia.  Continue sodium chloride tablets 3 times daily.  Follow-up chemistries- last Na 136 per father  6/23- Na 136- con't salt tabs 11. Neurofibromatosis-2- was getting steroids- per Dr Prince Rome can reduce by 20% every few days;  -will also try and contact Dr Shaaron Adler about possible additional chemotherapy- Dr Prince Rome was CLEAR can use estim, since not cancerous dx.   6/23- will wean Decadron to 4 mg q12 hours from q8 hours for 2 days, then con't to wean.  6/25- decrease steroids due to 2mg  q12 hours today  12. Impaired function             -will order grey call bell for pt.  13. Hyperkalemia  6/23- K+ 5.7- hemolyzed- will recheck in AM  6/24- K= 4.3  I spent a total of 30 minutes on total care today- mainly discussing bowel program.     LOS: 3 days A FACE TO FACE EVALUATION WAS PERFORMED  Alla Sloma 05/12/2020, 8:53 AM

## 2020-05-12 NOTE — Progress Notes (Signed)
Bowel program and dig stim successfully completed. Amanda Cockayne, LPN

## 2020-05-12 NOTE — Progress Notes (Signed)
Physical Therapy Session Note  Patient Details  Name: Cameron Hale [name]: [id number] Date of Birth: Dec 01, 1975  Today's Date: 05/12/2020 PT Individual Time: 1500-1530 PT Individual Time Calculation (min): 30 min   Short Term Goals: Week 1:  PT Short Term Goal 1 (Week 1): Pt will complete bed mobility with assist x 1 consistently PT Short Term Goal 2 (Week 1): Pt will maintain static sitting balance with mod A x 5 min PT Short Term Goal 3 (Week 1): Pt will initiate w/c mobility as safe and able  Skilled Therapeutic Interventions/Progress Updates:  Pt received semi-reclined in bed with wife at bedside. Agreeable to therapy. Denies any pain. Supine <> sit with total A x1 with min A x1 from second person. While seated EOB, pt reports possible bowel incontinence. Returned to supine with A x 2. Pt is max A for rolling R/L with skilled cueing for UE and LE placement. Pt is dependent for pericare and brief change following incontinent BM. Some bleeding noted during pericare from possible skin breakdown- RN made aware. PRAFOs placed by therapist to BLEs. Pt left seated in bed with needs in reach. All questions answered.  Therapy Documentation Precautions:  Precautions Precautions: Fall Precaution Comments: seizure precautions Restrictions Weight Bearing Restrictions: No  Therapy/Group: Individual Therapy  [name] 05/12/2020, 4:22 PM

## 2020-05-12 NOTE — Progress Notes (Signed)
Inpatient Rehabilitation Care Coordinator Assessment and Plan  Patient Details  Name: TYRUS WILMS [name]: [id number] Date of Birth: 10-31-1976  Today's Date: 05/12/2020  Problem List:  Patient Active Problem List   Diagnosis Date Noted  . Incomplete paraplegia ([place]) 05/09/2020  . Quadriplegia ([place]) 05/09/2020  . Sensorineural hearing loss (SNHL) of right ear with unrestricted hearing of left ear 06/03/2019  . Tinnitus of right ear 06/03/2019  . Mixed conductive and sensorineural hearing loss of right ear with unrestricted hearing of left ear 05/04/2019  . Keratoma 05/01/2016  . Difficulty walking 05/01/2016  . Neuromyopathy ([place]) 05/01/2016  . 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 Acoustic neuroma ([place]) 03/14/2015  . 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 H/O arthrodesis 02/17/2014  . Paraparesis ([place]) 02/17/2014  . Kidney lump 07/03/2012  . NF2 (neurofibromatosis 2) ([place]) 09/23/2011   Past Medical History:  Past Medical History:  Diagnosis Date  . NF2 (neurofibromatosis 2) ([place])    Past Surgical History:  Past Surgical History:  Procedure Laterality Date  . brain bleed     mri 2009  . HAND EXPLORATION    . LUMBAR FUSION    . NEPHRECTOMY     Partial left nephrectomy   . SPINAL FUSION     Social History:  reports that he has never smoked. He has never used smokeless tobacco. He reports previous alcohol use. He reports that he does not use drugs.  Family / Support Systems Marital Status: Married How Long?: 14 years Patient Roles: Spouse Spouse/Significant Other: Johny Chess (wife): [phone] Children: no children Other Supports: parents Anticipated Caregiver: wife/parents Ability/Limitations of Caregiver: Wife has restrictions due to surgery (5 wks post op) Caregiver Availability: 24/7 Family Dynamics: Pt lives with his wife  Social History Preferred language:  English Religion: Christian Cultural Background: Pt worked at [employer] for 11 yrs until [employer] started Education: high school Read: Yes Write: Yes Employment Status: Disabled Date Retired/Disabled/Unemployed: 2014 Public relations account executive Issues: Denies Guardian/Conservator: N/A   Abuse/Neglect Abuse/Neglect Assessment Can Be Completed: Yes Physical Abuse: Denies Verbal Abuse: Denies Sexual Abuse: Denies Exploitation of patient/patient's resources: Denies Self-Neglect: Denies  Emotional Status Pt's affect, behavior and adjustment status: Pt was tired at time of assessment and had gone to sleep Recent Psychosocial Issues: Denies Psychiatric History: Denies Substance Abuse History: Denies  Patient / Family Perceptions, Expectations & Goals Pt/Family understanding of illness & functional limitations: Pt wife has a general understanding of care neesd Premorbid pt/family roles/activities: Assistance with ADLs/IADLs Anticipated changes in roles/activities/participation: Same as above  [employer]: None Premorbid Home Care/DME Agencies: None Transportation available at discharge: family  Discharge Planning Living Arrangements: Spouse/significant other Support Systems: Spouse/significant other, Parent Type of Residence: Private residence Insurance underwriter Resources: Chartered certified accountant Resources: SSD Financial Screen Referred: No Living Expenses: Own Money Management: Spouse Does the patient have any problems obtaining your medications?: No Care Coordinator Barriers to Discharge: Decreased caregiver support, Lack of/limited family support Care Coordinator Barriers to Discharge Comments: wife is a Pharmacist, hospital and off until August; parents are in 20s. Care Coordinator Anticipated Follow Up Needs: HH/OP  Clinical Impression SW met with pt and pt wife at room to introduce self, explain role, and discuss discharge process. Pt was tired during visit and went to  sleep. SW received all information from wife for assessment. Pt is not a English as a second language teacher. No HCPOA. DME: RW and braces for legs. Wife states she is unsure on  pt d/c location as it will all depend on his level of care at discharge.   Symir Mah A Abigal Choung 05/12/2020, 4:01 PM

## 2020-05-12 NOTE — Plan of Care (Signed)
  Problem: Consults Goal: RH SPINAL CORD INJURY PATIENT EDUCATION Description:  See Patient Education module for education specifics.  Outcome: Progressing Goal: Skin Care Protocol Initiated - if Braden Score 18 or less Description: If consults are not indicated, leave blank or document N/A Outcome: Progressing   Problem: SCI BOWEL ELIMINATION Goal: RH STG MANAGE BOWEL WITH ASSISTANCE Description: STG Manage Bowel with max to total Assistance. Outcome: Progressing Goal: RH STG SCI MANAGE BOWEL WITH MEDICATION WITH ASSISTANCE Description: STG SCI Manage bowel with medication with max to total assistance. Outcome: Progressing Goal: RH STG SCI MANAGE BOWEL PROGRAM W/ASSIST OR AS APPROPRIATE Description: STG SCI Manage bowel program with max to total assist or as appropriate. Outcome: Progressing   Problem: SCI BLADDER ELIMINATION Goal: RH STG MANAGE BLADDER WITH ASSISTANCE Description: STG Manage Bladder With max Assistance Outcome: Progressing Goal: RH STG SCI MANAGE BLADDER PROGRAM W/ASSISTANCE Description: Manage bladder program with max assistance. Outcome: Progressing   Problem: RH SKIN INTEGRITY Goal: RH STG SKIN FREE OF INFECTION/BREAKDOWN Description: Skin to remain free from breakdown while on rehab with mod assistance. Outcome: Progressing Goal: RH STG MAINTAIN SKIN INTEGRITY WITH ASSISTANCE Description: STG Maintain Skin Integrity With mod Assistance. Outcome: Progressing Goal: RH STG ABLE TO PERFORM INCISION/WOUND CARE W/ASSISTANCE Description: STG Able To Perform Incision/Wound Care With mod Assistance. Outcome: Progressing   Problem: RH SAFETY Goal: RH STG ADHERE TO SAFETY PRECAUTIONS W/ASSISTANCE/DEVICE Description: STG Adhere to Safety Precautions With mod Assistance and appropriate assistive Device. Outcome: Progressing   Problem: RH PAIN MANAGEMENT Goal: RH STG PAIN MANAGED AT OR BELOW PT'S PAIN GOAL Description: <4 on a 0-10 pain scale. Outcome:  Progressing   Problem: RH KNOWLEDGE DEFICIT SCI Goal: RH STG INCREASE KNOWLEDGE OF SELF CARE AFTER SCI Description: Patient and caregiver will be able to demonstrate medication management, bowel program, bladder program, safety precautions, skin care, and follow up care with the MD with min assist from CIR staff. Outcome: Progressing

## 2020-05-12 NOTE — IPOC Note (Signed)
Overall Plan of Care [facility]) Patient Details Name: Cameron Hale [name]: [id number] DOB: 1976-03-23  Admitting Diagnosis: Quadriplegia [facility])  Hospital Problems: Principal Problem:   Quadriplegia [facility]) Active Problems:   NF2 (neurofibromatosis 2) ([place])   Incomplete paraplegia ([place])     Functional Problem List: Nursing Bladder, Medication Management, Safety, Bowel  PT Balance, Endurance, Motor, Safety, Sensory, Skin Integrity  OT Behavior, Balance, Endurance, Motor, Safety  SLP Linguistic, Nutrition  TR         Basic ADL's: OT Eating, Grooming, Bathing, Dressing, Toileting     Advanced  ADL's: OT       Transfers: PT Bed Mobility, Bed to Chair, Car, [employer], Floor  OT Toilet     Locomotion: PT Ambulation, Emergency planning/management officer, Stairs     Additional Impairments: OT None  SLP Swallowing, Communication expression    TR      Anticipated Outcomes Item Anticipated Outcome  Self Feeding min A  Swallowing  Mod I   Basic self-care  Mod A  Toileting  Max A   Bathroom Transfers Mod A - max A  Bowel/Bladder  Patient to be continent of bowel and bladder prior to discharge with minimal cues from staff.  Transfers  mod A  Locomotion  mod A with LRAD  Communication  Mod I  Cognition     Pain  Patient's pain to be < 2 prior to discharge.  Safety/Judgment  Patient to not be impulsive and free of falls or bodily injury during this admission.   Therapy Plan: PT Intensity: Minimum of 1-2 x/day ,45 to 90 minutes PT Frequency: 5 out of 7 days PT Duration Estimated Length of Stay: 24-28 days OT Intensity: Minimum of 1-2 x/day, 45 to 90 minutes OT Frequency: 5 out of 7 days OT Duration/Estimated Length of Stay: 24- 27 days SLP Intensity: Minumum of 1-2 x/day, 30 to 90 minutes SLP Frequency: 3 to 5 out of 7 days SLP Duration/Estimated Length of Stay: 24-28 days   Due to the current state of emergency, patients may not be receiving their 3-hours of Medicare-mandated  therapy.   Team Interventions: Nursing Interventions Patient/Family Education, Bowel Management, Bladder Management  PT interventions Balance/vestibular training, Community reintegration, Discharge planning, Disease management/prevention, DME/adaptive equipment instruction, Functional electrical stimulation, Functional mobility training, Neuromuscular re-education, Pain management, Patient/family education, Psychosocial support, Splinting/orthotics, Therapeutic Activities, Therapeutic Exercise, UE/LE Strength taining/ROM, UE/LE Coordination activities, Wheelchair propulsion/positioning  OT Interventions Balance/vestibular training, Self Care/advanced ADL retraining, UE/LE Coordination activities, Functional mobility training, Visual/perceptual remediation/compensation, Academic librarian, Wheelchair propulsion/positioning, Splinting/orthotics, Discharge planning, Therapeutic Activities, Pain management, Patient/family education, Therapeutic Exercise, DME/adaptive equipment instruction, Psychosocial support, UE/LE Strength taining/ROM  SLP Interventions Dysphagia/aspiration precaution training, Speech/Language facilitation, Therapeutic Activities, Environmental controls, Cueing hierarchy, Functional tasks, Patient/family education  TR Interventions    SW/CM Interventions Discharge Planning, Psychosocial Support, Patient/Family Education   Barriers to Discharge MD  Medical stability, Home enviroment access/loayout, Incontinence, Neurogenic bowel and bladder, Wound care, Lack of/limited family support and Weight  Nursing Incontinence, Neurogenic Bowel & Bladder    PT Medical stability, Incontinence, Neurogenic Bowel & Bladder    OT Medical stability    SLP      SW       Team Discharge Planning: Destination: PT-Home ,OT- Home , SLP-Home Projected Follow-up: PT-Home health PT, 24 hour supervision/assistance, OT-  Home health OT, 24 hour supervision/assistance, SLP-24 hour  supervision/assistance Projected Equipment Needs: PT-To be determined, OT- To be determined, SLP-None recommended by SLP Equipment Details: PT-TBD pending progress, OT-  Patient/family  involved in discharge planning: PT- Patient, Family member/caregiver,  OT-Patient, SLP-Patient, Family member/caregiver  MD ELOS: 3-4 weeks Medical Rehab Prognosis:  Good Assessment: Pt is a 44 yr old male with NF2 s/p ependymoma resection in cervical spine With Cx's- s/p meningitis and IV ABX- now incomplete ASIA B C4 quadriplegia- Starting on bowel program- has condom cathter-    Goals- Mod A   See Team Conference Notes for weekly updates to the plan of care

## 2020-05-12 NOTE — Progress Notes (Signed)
Occupational Therapy Session Note  Patient Details  Name: Cameron Hale [name]: [id number] Date of Birth: Mar 22, 1976  Today's Date: 05/12/2020 OT Individual Time: 0700-0755 OT Individual Time Calculation (min): 55 min    Short Term Goals: Week 1:  OT Short Term Goal 1 (Week 1): Pt will perform UB self care with max A. OT Short Term Goal 2 (Week 1): Pt will maintain static sitting balance with max A on EOB for 3 minutes. OT Short Term Goal 3 (Week 1): Pt will utilized AE and modifications as needed for self feeding with mod A.  Skilled Therapeutic Interventions/Progress Updates:    Pt asleep in bed upon arrival but easily aroused. OT intervention with focus on bed mobility to facilitate LB bathing/dressing and prepare for transfer to w/c.  Pt max A for rolling in bed using bed rails.  Pt incontinent of bowel/bladder with no awareness.  Dependent for hygiene and changing brief. Pt dependent for LB dressing tasks in bed.  Supine>sit EOB with HOB elevated with max A.  Sitting balance EOB with min A. SB transfer to w/c with tot A+2. Pt required tot A for changing shirts.  Pt wearing hospital paper scrubs. Oral care seated in w/c with tot A. Pt remained in w/c with soft call bell within reach and belt alarm activated.   Therapy Documentation Precautions:  Precautions Precautions: Fall Precaution Comments: seizure precautions Restrictions Weight Bearing Restrictions: No Pain:  Pt c/o RLE pain during bed mobility, unable to determine if spasms and specific location on RLE; repositioned   Therapy/Group: Individual Therapy  Leroy Libman 05/12/2020, 8:02 AM

## 2020-05-12 NOTE — Progress Notes (Signed)
Speech Language Pathology Daily Session Note  Patient Details  Name: ELDRA WORD [name]: [id number] Date of Birth: 1976/07/31  Today's Date: 05/12/2020 SLP Individual Time: 0920-1015 SLP Individual Time Calculation (min): 55 min  Short Term Goals: Week 1: SLP Short Term Goal 1 (Week 1): Patient will consume current diet with minimal overt s/s of aspiration with overall supervision level verbal cues. SLP Short Term Goal 2 (Week 1): Patient will participate in RMT testing SLP Short Term Goal 2 - Progress (Week 1): Met SLP Short Term Goal 3 (Week 1): Patient will utilize diaphramatic breathing at the word level in 50% of opportunities with Mod A verbal cues. SLP Short Term Goal 4 (Week 1): Patient will perform 15 repetitions of EMST at 5 cm H2O with Max A multimodal cues with a self-perceived effort level of 7/10 without reports of dizziness, etc. SLP Short Term Goal 5 (Week 1): Patient will perform 15 repetitions of IMST at 9 cm H2O with Max A multimodal cues with a self-perceived effort level of 7/10 without reports of dizziness, etc.  Skilled Therapeutic Interventions: Skilled treatment session focused on communication goals. SLP facilitated session by providing Max A verbal and visual cues for patient to utilize diaphragmatic breathing at the word level in 50% of opportunities. Patient appeared to demonstrate difficulty coordinating respiration and phonation requiring extra time. Patient performed 25 repetitions of EMST at 5 cm H2O with clinician assisting in lip seal and nose clips present. Patient reported a self-precieved effort level 8/10 without adverse effects. Patient also performed 15 repetitions of IMST at 9 cm H2O with clinician assisting in lip seal and nose clips present. Patient reported a self-precieved effort level 8/10 without adverse effects. Patient left upright in the wheelchair with alarm on and all needs within reach. Continue with current plan of care.      Pain No/Denies  Pain   Therapy/Group: Individual Therapy  Yoanna Jurczyk 05/12/2020, 10:46 AM

## 2020-05-13 ENCOUNTER — Inpatient Hospital Stay (HOSPITAL_COMMUNITY): Payer: Medicare Other | Admitting: Speech Pathology

## 2020-05-13 MED ORDER — NYSTATIN 100000 UNIT/ML MT SUSP
5.0000 mL | Freq: Four times a day (QID) | OROMUCOSAL | Status: AC
  Administered 2020-05-13 – 2020-05-18 (×18): 500000 [IU] via ORAL
  Filled 2020-05-13 (×19): qty 5

## 2020-05-13 MED ORDER — LEVETIRACETAM 100 MG/ML PO SOLN
1000.0000 mg | Freq: Two times a day (BID) | ORAL | Status: DC
  Administered 2020-05-13 – 2020-05-17 (×9): 1000 mg via ORAL
  Filled 2020-05-13 (×9): qty 10

## 2020-05-13 MED ORDER — FLUCONAZOLE 40 MG/ML PO SUSR
100.0000 mg | Freq: Every day | ORAL | Status: AC
  Administered 2020-05-14 – 2020-05-19 (×6): 100 mg via ORAL
  Filled 2020-05-13 (×6): qty 2.5

## 2020-05-13 MED ORDER — FLUCONAZOLE 40 MG/ML PO SUSR
200.0000 mg | Freq: Once | ORAL | Status: AC
  Administered 2020-05-13: 200 mg via ORAL
  Filled 2020-05-13: qty 5

## 2020-05-13 MED ORDER — ENOXAPARIN SODIUM 40 MG/0.4ML ~~LOC~~ SOLN
40.0000 mg | SUBCUTANEOUS | Status: DC
  Administered 2020-05-13 – 2020-06-04 (×23): 40 mg via SUBCUTANEOUS
  Filled 2020-05-13 (×23): qty 0.4

## 2020-05-13 NOTE — Progress Notes (Signed)
Bowel program started. Small soft bowel came out with dig stim, then suppository placed. Some bright red blood, scant, also noted with dig stim. Peri care performed. Patient tolerated well.

## 2020-05-13 NOTE — Progress Notes (Signed)
Speech Language Pathology Daily Session Note  Patient Details  Name: Cameron Hale [name]: [id number] Date of Birth: 03-02-1976  Today's Date: 05/13/2020 SLP Individual Time: 1300-1400 SLP Individual Time Calculation (min): 60 min  Short Term Goals: Week 1: SLP Short Term Goal 1 (Week 1): Patient will consume current diet with minimal overt s/s of aspiration with overall supervision level verbal cues. SLP Short Term Goal 2 (Week 1): Patient will participate in RMT testing SLP Short Term Goal 2 - Progress (Week 1): Met SLP Short Term Goal 3 (Week 1): Patient will utilize diaphramatic breathing at the word level in 50% of opportunities with Mod A verbal cues. SLP Short Term Goal 4 (Week 1): Patient will perform 15 repetitions of EMST at 5 cm H2O with Max A multimodal cues with a self-perceived effort level of 7/10 without reports of dizziness, etc. SLP Short Term Goal 5 (Week 1): Patient will perform 15 repetitions of IMST at 9 cm H2O with Max A multimodal cues with a self-perceived effort level of 7/10 without reports of dizziness, etc.  Skilled Therapeutic Interventions:  Pt was seen for skilled ST targeting dysphagia goals.  Pt was eating lunch upon therapist's arrival with assistance from nursing.  Pt consumed  dys 3 textures and thin liquids with total assist for self feeding and min assist verbal cues for use of liquid wash to mitigate both oral residue and intermittent reports of globus sensation (pt would intermittently grimace when swallowing and said that food "got stuck").  No overt s/s of aspiration were evident with solids or liquids.  Given pt's very prolonged oral phase, I would suggest that pt remain on his currently prescribed diet.  Pt was left in bed with bed alarm set and call bell within reach.  Continue per current plan of care.    Pain Pain Assessment Pain Scale: 0-10 Pain Score: 0-No pain  Therapy/Group: Individual Therapy  Elgie Landino, Selinda Orion 05/13/2020, 3:48 PM

## 2020-05-13 NOTE — Progress Notes (Signed)
[place] PHYSICAL MEDICINE & REHABILITATION PROGRESS NOTE   Subjective/Complaints:  Pt reports loss of voice- not sore- hard ot swallow- had good results with bowel program last night, per pt.   Asking for meds for thrush  ROS:  Pt denies SOB, abd pain, CP, N/V/C/D, and vision changes   Objective:   No results found. No results for input(s): WBC, HGB, HCT, PLT in the last 72 hours. Recent Labs    05/11/20 0519  NA 138  K 4.3  CL 103  CO2 24  GLUCOSE 100*  BUN 25*  CREATININE 0.51*  CALCIUM 9.4    Intake/Output Summary (Last 24 hours) at 05/13/2020 1630 Last data filed at 05/13/2020 1621 Gross per 24 hour  Intake 360 ml  Output 350 ml  Net 10 ml     Physical Exam: Vital Signs Blood pressure 133/84, pulse 73, temperature 98 F (36.7 C), resp. rate 19, height (P) 5\' 10"  (1.778 m), weight (P) 61.5 kg, SpO2 100 %.   Constitutional: pt in bed- supine- being fed, NAD HENT:  Thrush seen on insides of oropharynx- not on tongue yet- in back of oropharynx as well  Blind L eye- still decreased lateral movement R eye- no change Cardiovascular: RRR Respiratory: CTA B/L- no W/R/R- good air movement GI: Soft, NT, ND, (+)BS      Genitourinary Comments: Condom catheter in place draining light amber urine   Musculoskeletal:     Cervical back: Normal range of motion and neck supple. No rigidity.     Comments: Full strength exam done RUE- biceps 4-/5, WE 2-/5, triceps 1/5, grip 2-/5, finger abd 1/5 LUE- biceps 3/5, WE 2-/5, triceps 2-/5, grip 1/5, finger abd 1/5 RLE- HF, KE, DF, PF all 1/5 LLE- HF 2-/5; otherwise 0/5 in LLE  Neurological: Pt is alert, appropriate, vague, Ox2 Hoffman's in RUE- not LUE No clonus; MAS of 1 in UEs and 1+ in LEs Sensation to light touch impaired from C5 downwards- gets less notable as goes down torso/legs. - Skin: Skin is warm and dry.  Tiny, tiny area that's pink around anus- blanchable- skin tear- not open Psychiatric: bright  affect    Assessment/Plan: 1. 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  Physiatrist is providing close team supervision and 24 hour management of active medical problems listed below.  Physiatrist and rehab team continue to assess barriers to discharge/monitor patient progress toward functional and medical goals  Care Tool:  Bathing  Bathing activity did not occur: Safety/medical concerns     Body parts bathed by helper: Right arm, Left arm, Chest, Abdomen, Front perineal area, Buttocks, Right upper leg, Left upper leg, Right lower leg, Left lower leg, Face     Bathing assist Assist Level: Total Assistance - Patient < 25%     Upper Body Dressing/Undressing Upper body dressing   What is the patient wearing?: Pull over shirt    Upper body assist Assist Level: Dependent - Patient 0%    Lower Body Dressing/Undressing Lower body dressing      What is the patient wearing?: Incontinence brief     Lower body assist Assist for lower body dressing: Dependent - Patient 0%     Toileting Toileting Toileting Activity did not occur (Clothing management and hygiene only): N/A (no void or bm)  Toileting assist Assist for toileting: 2 Helpers     Transfers Chair/bed transfer  Transfers assist     Chair/bed transfer assist level: 2 Helpers     Locomotion Ambulation   Ambulation assist   Ambulation activity did not occur: Safety/medical concerns          Walk 10 feet activity   Assist  Walk 10 feet activity did not occur: Safety/medical concerns        Walk 50 feet activity   Assist Walk 50 feet with 2 turns activity did not occur: Safety/medical concerns         Walk 150 feet activity   Assist Walk 150 feet activity did not occur: Safety/medical concerns         Walk 10 feet on uneven surface   activity   Assist Walk 10 feet on uneven surfaces activity did not occur: Safety/medical concerns         Wheelchair     Assist Will patient use wheelchair at discharge?: Yes Type of Wheelchair: Manual Wheelchair activity did not occur: Safety/medical concerns  Wheelchair assist level: Dependent - Patient 0% Max wheelchair distance: 132ft    Wheelchair 50 feet with 2 turns activity    Assist    Wheelchair 50 feet with 2 turns activity did not occur: Safety/medical concerns   Assist Level: Dependent - Patient 0%   Wheelchair 150 feet activity     Assist  Wheelchair 150 feet activity did not occur: Safety/medical concerns   Assist Level: Dependent - Patient 0%   Blood pressure 133/84, pulse 73, temperature 98 F (36.7 C), resp. rate 19, height (P) 5\' 10"  (1.778 m), weight (P) 61.5 kg, SpO2 100 %.  Medical Problem List and Plan: 1.  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  Antibiotic therapy completed 04/28/2020- parents say 6/14- PICC out             -patient may  shower             -ELOS/Goals: goals min-mod A- 3-4 weeks 2.  Antithrombotics: -DVT/anticoagulation: Subcutaneous heparin.  Check vascular study 6/23- (-) for DVTs 6/26- change to Lovenox since no renal issues and not allergic             -antiplatelet therapy: n/a 3. Pain Management: Baclofen 10 mg twice daily for spasticity 4. Mood: not on any meds             -antipsychotic agents: n/a 5. Neuropsych: This patient is somewhat capable of making decisions on his own behalf. 6. Skin/Wound Care: no open skin areas- however has an area of irritation near anus- Stage I but TINY- will monitor 7. Fluids/Electrolytes/Nutrition:  regular diet with thin liquids- need SLP for dysarthria/dysphagia and cognition 8.  Neurogenic bowel and bladder.  Suppository nightly as needed, MiraLAX daily.  Check PVR's  6/23- PVRs ordered, but don't see recorded in chart- will order again, since need to know if pt retaining- having many incontinent stools- will d/w pt/parents about putting on bowel program.   6/24- d/w pt and father about bowel program - 2nd time saw pt- discussed at length. They will think about it.  6/25- will start bowel program per pt request.   6/26- went well last night- con't regimen 9.  Seizure disorder.  Keppra 1000 mg twice daily, Inderal 10 mg twice daily 10.  Hyponatremia.  Continue sodium chloride tablets 3 times daily.  Follow-up chemistries- last Na 136 per father  6/23- Na 136- con't salt tabs 11. Neurofibromatosis-2- was getting steroids- per Dr Prince Rome can reduce by 20% every few days;  -will also try and contact Dr Shaaron Adler about possible additional chemotherapy- Dr Prince Rome was CLEAR can use estim, since not cancerous dx.   6/23- will wean Decadron to 4 mg q12 hours from q8 hours for 2 days, then con't to wean.  6/25- decrease steroids due to 2mg  q12 hours today  12. Impaired function             -will order grey call bell for pt.  13. Hyperkalemia  6/23- K+ 5.7- hemolyzed- will recheck in AM  6/24- K= 4.3 14. Thrush  6/26- add diflucan 200 mg x1 then 100 mg daily x 6 days then nystatin S&S QID x 5 days.     LOS: 4 days A FACE TO FACE EVALUATION WAS PERFORMED  Kailene Steinhart 05/13/2020, 4:30 PM

## 2020-05-14 ENCOUNTER — Inpatient Hospital Stay (HOSPITAL_COMMUNITY): Payer: Medicare Other

## 2020-05-14 ENCOUNTER — Inpatient Hospital Stay (HOSPITAL_COMMUNITY): Payer: Medicare Other | Admitting: *Deleted

## 2020-05-14 MED ORDER — DEXAMETHASONE 2 MG PO TABS
2.0000 mg | ORAL_TABLET | Freq: Every day | ORAL | Status: DC
  Administered 2020-05-15 – 2020-05-19 (×5): 2 mg via ORAL
  Filled 2020-05-14 (×5): qty 1

## 2020-05-14 NOTE — Progress Notes (Signed)
Physical Therapy Session Note  Patient Details  Name: NICKALAS MCCARRICK [name]: [id number] Date of Birth: Jul 27, 1976  Today's Date: 05/14/2020 PT Individual Time: 0900-1000 and 1302-1400 PT Individual Time Calculation (min): 60 min and 58 min  Short Term Goals: Week 1:  PT Short Term Goal 1 (Week 1): Pt will complete bed mobility with assist x 1 consistently PT Short Term Goal 2 (Week 1): Pt will maintain static sitting balance with mod A x 5 min PT Short Term Goal 3 (Week 1): Pt will initiate w/c mobility as safe and able Skilled Therapeutic Interventions/Progress Updates: Pt presented in bed agreeable to therapy. PTA donned TED hose total A and threaded pants total A. Pt then performed rolling L/R maxA with PTA providing HOH assist to have pt hook arm over bed rail to allow PTA to pull pants over hips. Pt then performed sit to supine max to total A to sit EOB. Pt then transferred to [place] x 2 with PTA facilitating trunk to improve head/hips relationship. Pt transported to rehab gym and performed SB transfer to mat in same manner. Pt then participated in sitting balance for total 28mn with mirror feedback and pt able to maintain erect posture at minA level for approx 5-10 sec bouts. PTA was able to briefly remove support for 1-2 second bouts however pt unable to maintain and would begin to lean anteriorly/posteriorly and unable to recover. PTA also encouraged looking up at mirror during activity however pt would lower head with increased fatigue. Once activity completed pt returned to TMcClurex 2. Pt transported back to room and agreeable to remain in TIS until next therapy session (1 hour). Pt left in TIS with belt alarm on, soft touch bell within reach and needs met.   Tx2: Pt presented in bed with NT present eating lunch. PTA took over supervision for meal. Once completed meal PTA provided [facility]ist with wet washcloth to clean pt's mouth/face. Pt then performed roll to L maxA and performed  supine to sit maxA. Performed SB transfer to TIS with total A for SB set up and maxA x 2 for transfer to mat. Session then focused on sitting balance and UE AAROM activities from chair position due to limited +2 assistance during session. Pt transported to rehab gym and participated in UE AAROM elbow flexion/extension using unweighted green dowel with HOH assist for LUE. Pt was able to complete x 10 reps. Pt then participated in [facility] activity reaching for yellow cup on table with pt able to initiate reaching out with RUE and PTA providing truncal support to have pt reach forward in attempt to grasp cup. Pt was able to complete x 5. Pt then participated in placing BUE on tray table (with mod/maxA from PTA) then PTA facilitating unsupported sit away from TIS with hands on tray. PTA then assisting placing hands on hips and attempting to maintain minimally supported sit in TIS before returning to fully supported sitting. Pt performed x 5 with pt able to briefly maintain bouts of minimally supported sitting. PTA then performed BLE stretching/ROM including heel cord stretch, HS stretch (to pt's tolerance) hip flexion/extension, and hip ER/IR. Pt transported back to room and PTA placed gait belt around pt's knees for improved neutral positioning. Pt left in TIS with belt alarm on, soft touch button within reach, needs met, and wife present.       Therapy Documentation Precautions:  Precautions Precautions: Fall Precaution Comments: seizure precautions Restrictions Weight Bearing Restrictions:  No General:   Vital Signs: Therapy Vitals Temp: (!) 97.4 F (36.3 C) Pulse Rate: 89 Resp: 20 BP: (!) 123/98 Patient Position (if appropriate): Sitting Oxygen Therapy SpO2: 100 % O2 Device: Room Air Pain:   Mobility:   Locomotion :    Trunk/Postural Assessment :    Balance:   Exercises:   Other Treatments:      Therapy/Group: Individual Therapy  Rosita DeChalus 05/14/2020, 4:42 PM  

## 2020-05-14 NOTE — Progress Notes (Signed)
Occupational Therapy Session Note  Patient Details  Name: Cameron Hale [name]: [id number] Date of Birth: 1976/08/17  Today's Date: 05/14/2020 OT Individual Time: 1100-1155 OT Individual Time Calculation (min): 55 min    Short Term Goals: Week 2:     Skilled Therapeutic Interventions/Progress Updates:    OT session focused on trunk control required for ADLs and transfers. Pt received reclined in w/c agreeable to work on trunk control sitting EOM. Transitioned to therapy gym completing sliding board transfer bed<>w/c with total A+2. Sat EOM for ~25 minutes using mirror as feedback for midline orientation and postural control. Pt sustained static sitting balance up to 15 seconds with min assist. Transitioned back to room with pt reporting possible need for BM. Completed SBT transfer and sit>supine with total A +2. Completed skilled bed mobility for placing on bedpan. Pt left on bedpan with call button and NT notified.   Therapy Documentation Precautions:  Precautions Precautions: Fall Precaution Comments: seizure precautions Restrictions Weight Bearing Restrictions: No General:   Vital Signs:  Pain: Pain Assessment Pain Scale: 0-10 Pain Score: 1  ADL:   Vision   Perception    Praxis   Exercises:   Other Treatments:     Therapy/Group: Individual Therapy  Duayne Cal 05/14/2020, 11:50 AM

## 2020-05-14 NOTE — Plan of Care (Signed)
  Problem: RH SAFETY Goal: RH STG ADHERE TO SAFETY PRECAUTIONS W/ASSISTANCE/DEVICE Description: STG Adhere to Safety Precautions With mod Assistance and appropriate assistive Device. Outcome: Progressing   Problem: RH KNOWLEDGE DEFICIT SCI Goal: RH STG INCREASE KNOWLEDGE OF SELF CARE AFTER SCI Description: Patient and caregiver will be able to demonstrate medication management, bowel program, bladder program, safety precautions, skin care, and follow up care with the MD with min assist from CIR staff. Outcome: Progressing

## 2020-05-14 NOTE — Progress Notes (Signed)
[facility] PHYSICAL MEDICINE & REHABILITATION PROGRESS NOTE   Subjective/Complaints:   Pt reports doing well today- slightly easier to swallow- had good BM with dig stim/suppository- c/o few spasms.   Per nursing, is requiring in/out caths sometimes, not all the time, due to  urinary retention ROS:  Pt denies SOB, abd pain, CP, N/V/C/D, and vision changes  Objective:   No results found. No results for input(s): WBC, HGB, HCT, PLT in the last 72 hours. No results for input(s): NA, K, CL, CO2, GLUCOSE, BUN, CREATININE, CALCIUM in the last 72 hours.  Intake/Output Summary (Last 24 hours) at 05/14/2020 1501 Last data filed at 05/14/2020 0855 Gross per 24 hour  Intake 860 ml  Output 750 ml  Net 110 ml     Physical Exam: Vital Signs Blood pressure (!) 152/102, pulse 83, temperature 98.6 F (37 C), resp. rate 17, height (P) 5\' 10"  (1.778 m), weight (P) 61.5 kg, SpO2 96 %.   Constitutional: ppt up in power w/c, in recline, watching TV, appropriate, NAD HENT:  Thrush in oropharynx- no change Blind L eye- still decreased lateral movement R eye- no change Cardiovascular: RRR Respiratory: CTA B/L- no W/R/R- good air movement GI: Soft, NT, ND, (+)BS     Musculoskeletal:     Cervical back: Normal range of motion and neck supple. No rigidity.     Comments: Full strength exam done RUE- biceps 4-/5, WE 2-/5, triceps 1/5, grip 2-/5, finger abd 1/5 LUE- biceps 3/5, WE 2-/5, triceps 2-/5, grip 1/5, finger abd 1/5 RLE- HF, KE, DF, PF all 1/5 LLE- HF 2-/5; otherwise 0/5 in LLE  Neurological: Ox2 Hoffman's in RUE- not LUE No clonus; MAS of 1 in UEs and 1+ in LEs Sensation to light touch impaired from C5 downwards- gets less notable as goes down torso/legs. - Skin: Skin is warm and dry.  Tiny, tiny area that's pink around anus- blanchable- skin tear- not open- not assessed since up in w/c.  Psychiatric: distracted affect    Assessment/Plan: 1. 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  Physiatrist is providing close team supervision and 24 hour management of active medical problems listed below.  Physiatrist and rehab team continue to assess barriers to discharge/monitor patient progress toward functional and medical goals  Care Tool:  Bathing  Bathing activity did not occur: Safety/medical concerns     Body parts bathed by helper: Right arm, Left arm, Chest, Abdomen, Front perineal area, Buttocks, Right upper leg, Left upper leg, Right lower leg, Left lower leg, Face     Bathing assist Assist Level: Total Assistance - Patient < 25%     Upper Body Dressing/Undressing Upper body dressing   What is the patient wearing?: Pull over shirt    Upper body assist Assist Level: Dependent - Patient 0%    Lower Body Dressing/Undressing Lower body dressing      What is the patient wearing?: Incontinence brief     Lower body assist Assist for lower body dressing: Dependent - Patient 0%     Toileting Toileting Toileting Activity did not occur (Clothing management and hygiene only): N/A (no void or bm)  Toileting assist Assist for toileting: 2 Helpers     Transfers Chair/bed transfer  Transfers assist     Chair/bed transfer assist level: 2 Helpers     Locomotion Ambulation   Ambulation assist   Ambulation activity  did not occur: Safety/medical concerns          Walk 10 feet activity   Assist  Walk 10 feet activity did not occur: Safety/medical concerns        Walk 50 feet activity   Assist Walk 50 feet with 2 turns activity did not occur: Safety/medical concerns         Walk 150 feet activity   Assist Walk 150 feet activity did not occur: Safety/medical concerns         Walk 10 feet on uneven surface  activity   Assist Walk 10 feet on uneven surfaces activity did not occur:  Safety/medical concerns         Wheelchair     Assist Will patient use wheelchair at discharge?: Yes Type of Wheelchair: Manual Wheelchair activity did not occur: Safety/medical concerns  Wheelchair assist level: Dependent - Patient 0% Max wheelchair distance: 136ft    Wheelchair 50 feet with 2 turns activity    Assist    Wheelchair 50 feet with 2 turns activity did not occur: Safety/medical concerns   Assist Level: Dependent - Patient 0%   Wheelchair 150 feet activity     Assist  Wheelchair 150 feet activity did not occur: Safety/medical concerns   Assist Level: Dependent - Patient 0%   Blood pressure (!) 152/102, pulse 83, temperature 98.6 F (37 C), resp. rate 17, height (P) 5\' 10"  (1.778 m), weight (P) 61.5 kg, SpO2 96 %.  Medical Problem List and Plan: 1.  Decreased functional mobility/C4 ASIA B/C myelopathy with neurogenic bowel and bladder and mild spasticity secondary to neurofibromatosis type II with multiple prior surgeries for meningioma neurofibroma resection and most recent resection of dural based spinal tumor 5/63/8756 complicated by CSF leak wound revision.  Antibiotic therapy completed 04/28/2020- parents say 6/14- PICC out             -patient may  shower             -ELOS/Goals: goals min-mod A- 3-4 weeks 2.  Antithrombotics: -DVT/anticoagulation: Subcutaneous heparin.  Check vascular study 6/23- (-) for DVTs 6/26- change to Lovenox since no renal issues and not allergic             -antiplatelet therapy: n/a 3. Pain Management: Baclofen 10 mg twice daily for spasticity 4. Mood: not on any meds             -antipsychotic agents: n/a 5. Neuropsych: This patient is somewhat capable of making decisions on his own behalf. 6. Skin/Wound Care: no open skin areas- however has an area of irritation near anus- Stage I but TINY- will monitor 7. Fluids/Electrolytes/Nutrition: regular diet with thin liquids- need SLP for dysarthria/dysphagia and  cognition 8.  Neurogenic bowel and bladder.  Suppository nightly as needed, MiraLAX daily.  Check PVR's  6/23- PVRs ordered, but don't see recorded in chart- will order again, since need to know if pt retaining- having many incontinent stools- will d/w pt/parents about putting on bowel program.   6/24- d/w pt and father about bowel program - 2nd time saw pt- discussed at length. They will think about it.  6/25- will start bowel program per pt request.   6/26- went well last night- con't regimen  6/27- responding well to bowel program; needing occ I/o caths as well per nursing 9.  Seizure disorder.  Keppra 1000 mg twice daily, Inderal 10 mg twice daily 10.  Hyponatremia.  Continue sodium chloride tablets 3 times daily.  Follow-up chemistries-  last Na 136 per father  6/23- Na 136- con't salt tabs 11. Neurofibromatosis-2- was getting steroids- per Dr Prince Rome can reduce by 20% every few days;  -will also try and contact Dr Shaaron Adler about possible additional chemotherapy- Dr Prince Rome was CLEAR can use estim, since not cancerous dx.   6/23- will wean Decadron to 4 mg q12 hours from q8 hours for 2 days, then con't to wean.  6/25- decrease steroids due to 2mg  q12 hours today   6/27- will decrease to 2 mg daily. In 3 days, will stop, if tolerating well, esp since got thrush from steroids.  12. Impaired function             -will order grey call bell for pt.  13. Hyperkalemia  6/23- K+ 5.7- hemolyzed- will recheck in AM  6/24- K= 4.3 14. Thrush  6/26- add diflucan 200 mg x1 then 100 mg daily x 6 days then nystatin S&S QID x 5 days.  6/27- doing better Sx's wise- doesn't look any difference     LOS: 5 days A FACE TO FACE EVALUATION WAS PERFORMED  Kourtney Terriquez 05/14/2020, 3:01 PM

## 2020-05-15 ENCOUNTER — Inpatient Hospital Stay (HOSPITAL_COMMUNITY): Payer: Medicare Other | Admitting: Physical Therapy

## 2020-05-15 ENCOUNTER — Inpatient Hospital Stay (HOSPITAL_COMMUNITY): Payer: Medicare Other

## 2020-05-15 ENCOUNTER — Inpatient Hospital Stay (HOSPITAL_COMMUNITY): Payer: Medicare Other | Admitting: Occupational Therapy

## 2020-05-15 ENCOUNTER — Inpatient Hospital Stay (HOSPITAL_COMMUNITY): Payer: Medicare Other | Admitting: Speech Pathology

## 2020-05-15 MED ORDER — WHITE PETROLATUM EX OINT
TOPICAL_OINTMENT | CUTANEOUS | Status: AC
  Filled 2020-05-15: qty 28.35

## 2020-05-15 NOTE — Progress Notes (Signed)
Occupational Therapy Session Note  Patient Details  Name: Cameron Hale [name]: [id number] Date of Birth: 11-12-1976  Today's Date: 05/15/2020 OT Individual Time: 1000-1100 OT Individual Time Calculation (min): 60 min    Short Term Goals: Week 1:  OT Short Term Goal 1 (Week 1): Pt will perform UB self care with max A. OT Short Term Goal 2 (Week 1): Pt will maintain static sitting balance with max A on EOB for 3 minutes. OT Short Term Goal 3 (Week 1): Pt will utilized AE and modifications as needed for self feeding with mod A.  Skilled Therapeutic Interventions/Progress Updates:    Pt resting in TIS w/c upon arrival. OT intervention with focus on SB transfers, unsupported sitting balance, BUE functional use, activity tolerance, and safety awareness to increase independence with BADLs. SB transfers with tot A+2. Pt engaged in unsupported sitting balance with BUE support at EOM. 3 trials: 21 secs and 50 secs. Dycem provided for B hands on mat and last trial for 1'21". Pt able to shift weight slightly to L. Pt requires assistance coming forward from dependent position against back support. Pt also issued a wrist support with u-cuff for R hand. Pt practiced lateral leans with tot A in preparation for SB transfers. Pt remained in w/c with all needs within reach.  Pt demonstrated ability to activate soft call bell when placed on hard surface. Belt alarm activated.   Therapy Documentation Precautions:  Precautions Precautions: Fall Precaution Comments: seizure precautions Restrictions Weight Bearing Restrictions: No Pain: Pt denies pain this morning  Therapy/Group: Individual Therapy  Leroy Libman 05/15/2020, 11:02 AM

## 2020-05-15 NOTE — Progress Notes (Signed)
Physical Therapy Session Note  Patient Details  Name: Cameron Hale [name]: [id number] Date of Birth: [birth year]  Today's Date: 05/15/2020 PT Individual Time: 0800-0900 PT Individual Time Calculation (min): 60 min   Short Term Goals: Week 1:  PT Short Term Goal 1 (Week 1): Pt will complete bed mobility with assist x 1 consistently PT Short Term Goal 2 (Week 1): Pt will maintain static sitting balance with mod A x 5 min PT Short Term Goal 3 (Week 1): Pt will initiate w/c mobility as safe and able  Skilled Therapeutic Interventions/Progress Updates:  Pt received semi-reclined in bed in room. Agreeable to therapy. States he has a 10/10 HA- RN immediately made aware and medicated pt during session. While semi-reclined in bed, BP is 143/98 with a HR of 77. Pt is dependent for brief change and donning knee-high TED hose and pants at bed level. Pt is max A for rolling R/L using B/L handrails with skilled cueing for positioning for dressing. Supine to sit with total A x1. Denies any lightheadedness or dizziness while seated. Slide board transfer EOB > TIS <> mat table with total A x2 (one person providing total A, one person providing mod A) with pt requiring total A for trunk control during transfer. Pt is max A to don shirt seated in TIS. Pt is mod A for sitting balance EOM for trunk control. To improve functional mobility while seated on mat table, pt performed scooting forwards then backwards for two sets with total A x2 (one person providing total A to scoot, one person providing support posteriorly). To improve trunk strength/control and promote weightbearing through UEs while seated on mat table, pt performed lateral leans 1x5 each side with mod A provided when leaning to the R and mod-max A provided when leaning to the L. Pt left seated in TIS in room with all needs in reach. All questions answered.  Therapy Documentation Precautions:  Precautions Precautions: Fall Precaution Comments: seizure  precautions Restrictions Weight Bearing Restrictions: No  Therapy/Group: Individual Therapy   Maziah Keeling, SPT   05/15/2020, 12:26 PM

## 2020-05-15 NOTE — Progress Notes (Signed)
Speech Language Pathology Daily Session Note  Patient Details  Name: Cameron Hale [name]: [id number] Date of Birth: September 23, 1976  Today's Date: 05/15/2020 SLP Individual Time: 1305-1400 SLP Individual Time Calculation (min): 55 min  Short Term Goals: Week 1: SLP Short Term Goal 1 (Week 1): Patient will consume current diet with minimal overt s/s of aspiration with overall supervision level verbal cues. SLP Short Term Goal 2 (Week 1): Patient will participate in RMT testing SLP Short Term Goal 2 - Progress (Week 1): Met SLP Short Term Goal 3 (Week 1): Patient will utilize diaphramatic breathing at the word level in 50% of opportunities with Mod A verbal cues. SLP Short Term Goal 4 (Week 1): Patient will perform 15 repetitions of EMST at 5 cm H2O with Max A multimodal cues with a self-perceived effort level of 7/10 without reports of dizziness, etc. SLP Short Term Goal 5 (Week 1): Patient will perform 15 repetitions of IMST at 9 cm H2O with Max A multimodal cues with a self-perceived effort level of 7/10 without reports of dizziness, etc.  Skilled Therapeutic Interventions: Skilled treatment session focused on dysphagia and speech goals. Upon arrival, patient had not eaten his lunch. SLP fed the patient his meal of Dys. 3 textures with thin liquids. Patient consumed meal without overt s/s of aspiration but required extra time for mastication and oral transit. Recommend patient continue current diet. SLP also facilitated session by providing Mod A verbal cues for use of diaphragmatic breathing to achieve an increased vocal intensity at the word and phrase level. Patient left upright in the wheelchair with alarm on and all needs within reach. Continue with current plan of care.      Pain No/Denies Pain   Therapy/Group: Individual Therapy  Brandie Lopes 05/15/2020, 3:18 PM

## 2020-05-15 NOTE — Plan of Care (Signed)
  Problem: Consults Goal: RH SPINAL CORD INJURY PATIENT EDUCATION Description:  See Patient Education module for education specifics.  Outcome: Progressing Goal: Skin Care Protocol Initiated - if Braden Score 18 or less Description: If consults are not indicated, leave blank or document N/A Outcome: Progressing   Problem: SCI BOWEL ELIMINATION Goal: RH STG MANAGE BOWEL WITH ASSISTANCE Description: STG Manage Bowel with max to total Assistance. Outcome: Progressing Goal: RH STG SCI MANAGE BOWEL WITH MEDICATION WITH ASSISTANCE Description: STG SCI Manage bowel with medication with max to total assistance. Outcome: Progressing Goal: RH STG SCI MANAGE BOWEL PROGRAM W/ASSIST OR AS APPROPRIATE Description: STG SCI Manage bowel program with max to total assist or as appropriate. Outcome: Progressing   Problem: SCI BLADDER ELIMINATION Goal: RH STG MANAGE BLADDER WITH ASSISTANCE Description: STG Manage Bladder With max Assistance Outcome: Progressing Goal: RH STG SCI MANAGE BLADDER PROGRAM W/ASSISTANCE Description: Manage bladder program with max assistance. Outcome: Progressing   Problem: RH SKIN INTEGRITY Goal: RH STG SKIN FREE OF INFECTION/BREAKDOWN Description: Skin to remain free from breakdown while on rehab with mod assistance. Outcome: Progressing Goal: RH STG MAINTAIN SKIN INTEGRITY WITH ASSISTANCE Description: STG Maintain Skin Integrity With mod Assistance. Outcome: Progressing Goal: RH STG ABLE TO PERFORM INCISION/WOUND CARE W/ASSISTANCE Description: STG Able To Perform Incision/Wound Care With mod Assistance. Outcome: Progressing   Problem: RH SAFETY Goal: RH STG ADHERE TO SAFETY PRECAUTIONS W/ASSISTANCE/DEVICE Description: STG Adhere to Safety Precautions With mod Assistance and appropriate assistive Device. Outcome: Progressing   Problem: RH PAIN MANAGEMENT Goal: RH STG PAIN MANAGED AT OR BELOW PT'S PAIN GOAL Description: <4 on a 0-10 pain scale. Outcome:  Progressing   Problem: RH KNOWLEDGE DEFICIT SCI Goal: RH STG INCREASE KNOWLEDGE OF SELF CARE AFTER SCI Description: Patient and caregiver will be able to demonstrate medication management, bowel program, bladder program, safety precautions, skin care, and follow up care with the MD with min assist from CIR staff. Outcome: Progressing

## 2020-05-15 NOTE — Progress Notes (Signed)
Patient refused his suppository and stated, "I don't want it and I don't need it."

## 2020-05-15 NOTE — Progress Notes (Signed)
Occupational Therapy Session Note  Patient Details  Name: Cameron Hale [name]: [id number] Date of Birth: Sep 23, 1976  Today's Date: 05/15/2020 OT Individual Time: 5615-3794 OT Individual Time Calculation (min): 42 min   Short Term Goals: Week 1:  OT Short Term Goal 1 (Week 1): Pt will perform UB self care with max A. OT Short Term Goal 2 (Week 1): Pt will maintain static sitting balance with max A on EOB for 3 minutes. OT Short Term Goal 3 (Week 1): Pt will utilized AE and modifications as needed for self feeding with mod A.    Skilled Therapeutic Interventions/Progress Updates:    Pt greeted in the TIS, no c/o pain and agreeable to brushing his teeth at the sink. Placed TIS in neutral position to work on trunk control. Using pts universal cuff, pt required HOH to use the Rt hand to brush his teeth. HOH to use the Lt hand to manage the hot faucet lever and reach for soap dispenser when washing his hands. OT provided him with a wash mitt and he washed his face with [facility] assist as well. Max A for leaning anterioraly towards faucet levers and sink throughout session with pt actively assisting as able. At end of session pt was reclined in TIS with safety belt fastened and soft call bell on his pillow. Tx focus placed on trunk control, UB NMR, and adaptive self care skills.   Therapy Documentation Precautions:  Precautions Precautions: Fall Precaution Comments: seizure precautions Restrictions Weight Bearing Restrictions: No Pain: Pain Assessment Pain Scale: 0-10 Pain Score: 2  Faces Pain Scale: Hurts a little bit Pain Type: Acute pain Pain Location: Head Pain Orientation: Right Pain Descriptors / Indicators: Aching;Headache Pain Onset: On-going Patients Stated Pain Goal: 2 ADL:       Therapy/Group: Individual Therapy  [name] A [name] 05/15/2020, 12:25 PM

## 2020-05-15 NOTE — Progress Notes (Signed)
[place] PHYSICAL MEDICINE & REHABILITATION PROGRESS NOTE   Subjective/Complaints: No complaints this morning. Eating a nutritious breakfast of banana and oatmeal. Elta Guadeloupe is assisting him. Denies pain, constipation, insomnia. BP slightly elevated, vitals otherwise stable  ROS: Pt denies SOB, abd pain, CP, N/V/C/D, and vision changes  Objective:   No results found. No results for input(s): WBC, HGB, HCT, PLT in the last 72 hours. No results for input(s): NA, K, CL, CO2, GLUCOSE, BUN, CREATININE, CALCIUM in the last 72 hours.  Intake/Output Summary (Last 24 hours) at 05/15/2020 1559 Last data filed at 05/15/2020 1452 Gross per 24 hour  Intake 440 ml  Output 600 ml  Net -160 ml     Physical Exam: Vital Signs Blood pressure (!) 143/98, pulse 77, temperature 97.9 F (36.6 C), temperature source Oral, resp. rate 20, height (P) 5\' 10"  (1.778 m), weight (P) 61.5 kg, SpO2 99 %. General: Alert and oriented x 3, No apparent distress HEENT: Head is normocephalic, atraumatic, PERRLA, EOMI, sclera anicteric, oral mucosa pink and moist, dentition intact, ext ear canals clear,  Neck: Supple without JVD or lymphadenopathy Heart: Reg rate and rhythm. No murmurs rubs or gallops Chest: CTA bilaterally without wheezes, rales, or rhonchi; no distress Abdomen: Soft, non-tender, non-distended, bowel sounds positive. Musculoskeletal:     Cervical back: Normal range of motion and neck supple. No rigidity.     Comments: Full strength exam done RUE- biceps 4-/5, WE 2-/5, triceps 1/5, grip 2-/5, finger abd 1/5 LUE- biceps 3/5, WE 2-/5, triceps 2-/5, grip 1/5, finger abd 1/5 RLE- HF, KE, DF, PF all 1/5 LLE- HF 2-/5; otherwise 0/5 in LLE  Neurological: Ox2 Hoffman's in RUE- not LUE No clonus; MAS of 1 in UEs and 1+ in LEs Sensation to light touch impaired from C5 downwards- gets less notable as goes down torso/legs. - Skin: Skin is warm and dry.  Tiny, tiny area that's pink around anus- blanchable-  skin tear- not open- not assessed since up in w/c.  Psychiatric: distracted affect     Assessment/Plan: 1. 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  Physiatrist is providing close team supervision and 24 hour management of active medical problems listed below.  Physiatrist and rehab team continue to assess barriers to discharge/monitor patient progress toward functional and medical goals  Care Tool:  Bathing  Bathing activity did not occur: Safety/medical concerns     Body parts bathed by helper: Right arm, Left arm, Chest, Abdomen, Front perineal area, Buttocks, Right upper leg, Left upper leg, Right lower leg, Left lower leg, Face     Bathing assist Assist Level: Total Assistance - Patient < 25%     Upper Body Dressing/Undressing Upper body dressing   What is the patient wearing?: Pull over shirt    Upper body assist Assist Level: Dependent - Patient 0%    Lower Body Dressing/Undressing Lower body dressing      What is the patient wearing?: Incontinence brief     Lower body assist Assist for lower body dressing: Dependent - Patient 0%     Toileting Toileting Toileting Activity did not occur (Clothing management and hygiene only): N/A (no void or bm)  Toileting assist Assist for toileting: 2 Helpers     Transfers Chair/bed transfer  Transfers assist     Chair/bed transfer assist level: 2 Helpers     Locomotion Ambulation   Ambulation assist  Ambulation activity did not occur: Safety/medical concerns          Walk 10 feet activity   Assist  Walk 10 feet activity did not occur: Safety/medical concerns        Walk 50 feet activity   Assist Walk 50 feet with 2 turns activity did not occur: Safety/medical concerns         Walk 150 feet activity   Assist Walk 150 feet activity did not  occur: Safety/medical concerns         Walk 10 feet on uneven surface  activity   Assist Walk 10 feet on uneven surfaces activity did not occur: Safety/medical concerns         Wheelchair     Assist Will patient use wheelchair at discharge?: Yes Type of Wheelchair: Manual Wheelchair activity did not occur: Safety/medical concerns  Wheelchair assist level: Dependent - Patient 0% Max wheelchair distance: 178ft    Wheelchair 50 feet with 2 turns activity    Assist    Wheelchair 50 feet with 2 turns activity did not occur: Safety/medical concerns   Assist Level: Dependent - Patient 0%   Wheelchair 150 feet activity     Assist  Wheelchair 150 feet activity did not occur: Safety/medical concerns   Assist Level: Dependent - Patient 0%   Blood pressure (!) 143/98, pulse 77, temperature 97.9 F (36.6 C), temperature source Oral, resp. rate 20, height (P) 5\' 10"  (1.778 m), weight (P) 61.5 kg, SpO2 99 %.  Medical Problem List and Plan: 1.  Decreased functional mobility/C4 ASIA B/C myelopathy with neurogenic bowel and bladder and mild spasticity secondary to neurofibromatosis type II with multiple prior surgeries for meningioma neurofibroma resection and most recent resection of dural based spinal tumor 7/86/7672 complicated by CSF leak wound revision.  Antibiotic therapy completed 04/28/2020- parents say 6/14- PICC out             -patient may  shower             -ELOS/Goals: goals min-mod A- 3-4 weeks  -Continue CIR 2.  Antithrombotics: -DVT/anticoagulation: Subcutaneous heparin.  Check vascular study 6/23- (-) for DVTs 6/26- change to Lovenox since no renal issues and not allergic             -antiplatelet therapy: n/a 3. Pain Management: Baclofen 10 mg twice daily for spasticity. Well controlled.  4. Mood: not on any meds             -antipsychotic agents: n/a 5. Neuropsych: This patient is somewhat capable of making decisions on his own behalf. 6.  Skin/Wound Care: no open skin areas- however has an area of irritation near anus- Stage I but TINY- will monitor 7. Fluids/Electrolytes/Nutrition: regular diet with thin liquids- need SLP for dysarthria/dysphagia and cognition 8.  Neurogenic bowel and bladder.  Suppository nightly as needed, MiraLAX daily.  Check PVR's  6/23- PVRs ordered, but don't see recorded in chart- will order again, since need to know if pt retaining- having many incontinent stools- will d/w pt/parents about putting on bowel program.   6/24- d/w pt and father about bowel program - 2nd time saw pt- discussed at length. They will think about it.  6/25- will start bowel program per pt request.   6/26- went well last night- con't regimen  6/27- responding well to bowel program; needing occ I/o caths as well per nursing 9.  Seizure disorder.  Keppra 1000 mg twice daily, Inderal 10 mg twice daily. Seizure precautions.  10.  Hyponatremia.  Continue sodium chloride tablets 3 times daily.  Follow-up chemistries- last Na 136 per father  6/23- Na 136- con't salt tabs 11. Neurofibromatosis-2- was getting steroids- per Dr Prince Rome can reduce by 20% every few days;  -will also try and contact Dr Shaaron Adler about possible additional chemotherapy- Dr Prince Rome was CLEAR can use estim, since not cancerous dx.   6/23- will wean Decadron to 4 mg q12 hours from q8 hours for 2 days, then con't to wean.  6/25- decrease steroids due to 2mg  q12 hours today   6/27- will decrease to 2 mg daily. In 3 days, will stop, if tolerating well, esp since got thrush from steroids.  12. Impaired function             -will order grey call bell for pt.  13. Hyperkalemia  6/23- K+ 5.7- hemolyzed- will recheck in AM  6/24- K= 4.3 14. Thrush  6/26- add diflucan 200 mg x1 then 100 mg daily x 6 days then nystatin S&S QID x 5 days.  6/27- doing better Sx's wise- doesn't look any difference  15. Dysphagia: continue dysphagia 3 diet and SLP treatment.     LOS: 6 days A  FACE TO FACE EVALUATION WAS PERFORMED  Clide Deutscher Dorothey Oetken 05/15/2020, 3:59 PM

## 2020-05-16 ENCOUNTER — Inpatient Hospital Stay (HOSPITAL_COMMUNITY): Payer: Medicare Other | Admitting: *Deleted

## 2020-05-16 ENCOUNTER — Inpatient Hospital Stay (HOSPITAL_COMMUNITY): Payer: Medicare Other | Admitting: Speech Pathology

## 2020-05-16 ENCOUNTER — Inpatient Hospital Stay (HOSPITAL_COMMUNITY): Payer: Medicare Other | Admitting: Physical Therapy

## 2020-05-16 NOTE — Progress Notes (Signed)
Occupational Therapy Session Note  Patient Details  Name: Cameron Hale [name]: [id number] Date of Birth: 03/21/1976  Today's Date: 05/16/2020 OT Individual Time: 1300-1345 OT Individual Time Calculation (min): 45 min    Short Term Goals: Week 2:  OT Short Term Goal 1 (Week 2): Pt will perform UB self care with max A. OT Short Term Goal 2 (Week 2): Pt will maintain static sitting balance with min A on EOB for 3 minutes. OT Short Term Goal 3 (Week 2): Pt will utilized AE and modifications as needed for self feeding with mod A.  Skilled Therapeutic Interventions/Progress Updates:    PT resting in bed upon arrival with mother present.  Pt's mother departed soon after OTA arrival.  OT intervention with focus on bed mobility, SB transfers, and sitting balance EOM. Supine>sit EOB with tot A.  SB transfer bed<>w/c and w/c<>EOM with tot A+2. Static sitting balance with BUE support and BLE support.  Pt's B hands placed on dycem to facilitate pt's ability to use BUE for support. Pt challenged with using core muscles to sit upright from posterior lean against therapy ball (approx 10 degrees). Pt successful X 1 using BUE to pull on mat and forward head position.  Static sitting trials X 5 with longest trial at 1'30". Pt returned to room and remained in w/c with all needs within reach.  Pt able to activate soft call bell.   Therapy Documentation Precautions:  Precautions Precautions: Fall Precaution Comments: seizure precautions Restrictions Weight Bearing Restrictions: No   Pain: Pt denies pain this afternoon  Therapy/Group: Individual Therapy  Leroy Libman 05/16/2020, 2:37 PM

## 2020-05-16 NOTE — Progress Notes (Signed)
[facility] PHYSICAL MEDICINE & REHABILITATION PROGRESS NOTE   Subjective/Complaints:  Pt reports doing well- notes that swallowing is a little easier- due to thrush treatment- bowel program is going well per pt- but also thinks getting control- but admits, still going in depends, not in bedpan- can't hold it that long. Pt refused suppository since thought getting control of bowels- but then ended up he isn't- he's just going some on his own-    End up having BM last night around 10pm-- incontinent ROS:  Pt denies SOB, abd pain, CP, N/V/C/D, and vision changes   Objective:   No results found. No results for input(s): WBC, HGB, HCT, PLT in the last 72 hours. No results for input(s): NA, K, CL, CO2, GLUCOSE, BUN, CREATININE, CALCIUM in the last 72 hours.  Intake/Output Summary (Last 24 hours) at 05/16/2020 1004 Last data filed at 05/16/2020 0700 Gross per 24 hour  Intake 979 ml  Output 1200 ml  Net -221 ml     Physical Exam: Vital Signs Blood pressure (!) 123/91, pulse 94, temperature 98 F (36.7 C), temperature source Oral, resp. rate 16, height (P) 5\' 10"  (1.778 m), weight (P) 61.5 kg, SpO2 97 %. General: sitting up in bed- finished all breakfast- getting meds in pudding via RN, NAD HEENT: head scars from previous incisions and neck incision looks good- thrush not seen anymore in oropharynx! Heart: RRR Chest: CTA B/L- no W/R/R- good air movement Abdomen: Soft, NT, ND, (+)BS  Musculoskeletal:     Cervical back: Normal range of motion and neck supple. No rigidity.     Comments: Full strength exam done RUE- biceps 4-/5, WE 2-/5, triceps 1/5, grip 2-/5, finger abd 1/5 LUE- biceps 3/5, WE 2-/5, triceps 2-/5, grip 1/5, finger abd 1/5 RLE- HF, KE, DF, PF all 1/5 LLE- HF 2-/5; otherwise 0/5 in LLE  Neurological:Ox2 Hoffman's in RUE- not LUE No clonus; MAS of 1 in UEs and 1+ in LEs Sensation to light touch impaired from C5 downwards- gets less notable as goes down torso/legs.  - Skin: Skin is warm and dry.  Tiny, tiny area that's pink around anus- blanchable- skin tear- not open- not assessed since up in w/c.  Psychiatric: bright affect     Assessment/Plan: 1. 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  Physiatrist is providing close team supervision and 24 hour management of active medical problems listed below.  Physiatrist and rehab team continue to assess barriers to discharge/monitor patient progress toward functional and medical goals  Care Tool:  Bathing  Bathing activity did not occur: Safety/medical concerns     Body parts bathed by helper: Right arm, Left arm, Chest, Abdomen, Front perineal area, Buttocks, Right upper leg, Left upper leg, Right lower leg, Left lower leg, Face     Bathing assist Assist Level: Total Assistance - Patient < 25%     Upper Body Dressing/Undressing Upper body dressing   What is the patient wearing?: Pull over shirt    Upper body assist Assist Level: Total Assistance - Patient < 25%    Lower Body Dressing/Undressing Lower body dressing      What is the patient wearing?: Incontinence brief, Pants     Lower body assist Assist for lower body dressing: Dependent - Patient 0%     Toileting Toileting Toileting Activity did not occur (Clothing management and hygiene only): N/A (no void or bm)  Toileting assist Assist for toileting: 2 Helpers     Transfers Chair/bed transfer  Transfers assist     Chair/bed transfer assist level: 2 Helpers     Locomotion Ambulation   Ambulation assist   Ambulation activity did not occur: Safety/medical concerns          Walk 10 feet activity   Assist  Walk 10 feet activity did not occur: Safety/medical concerns        Walk 50 feet activity   Assist Walk 50 feet with 2 turns activity did not occur:  Safety/medical concerns         Walk 150 feet activity   Assist Walk 150 feet activity did not occur: Safety/medical concerns         Walk 10 feet on uneven surface  activity   Assist Walk 10 feet on uneven surfaces activity did not occur: Safety/medical concerns         Wheelchair     Assist Will patient use wheelchair at discharge?: Yes Type of Wheelchair: Manual Wheelchair activity did not occur: Safety/medical concerns  Wheelchair assist level: Dependent - Patient 0% Max wheelchair distance: 138ft    Wheelchair 50 feet with 2 turns activity    Assist    Wheelchair 50 feet with 2 turns activity did not occur: Safety/medical concerns   Assist Level: Dependent - Patient 0%   Wheelchair 150 feet activity     Assist  Wheelchair 150 feet activity did not occur: Safety/medical concerns   Assist Level: Dependent - Patient 0%   Blood pressure (!) 123/91, pulse 94, temperature 98 F (36.7 C), temperature source Oral, resp. rate 16, height (P) 5\' 10"  (1.778 m), weight (P) 61.5 kg, SpO2 97 %.  Medical Problem List and Plan: 1.  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  Antibiotic therapy completed 04/28/2020- parents say 6/14- PICC out             -patient may  shower             -ELOS/Goals: goals min-mod A- 3-4 weeks  -Continue CIR 2.  Antithrombotics: -DVT/anticoagulation: Subcutaneous heparin.  Check vascular study 6/23- (-) for DVTs 6/26- change to Lovenox since no renal issues and not allergic             -antiplatelet therapy: n/a 3. Pain Management: Baclofen 10 mg twice daily for spasticity. Well controlled.  4. Mood: not on any meds             -antipsychotic agents:  n/a 5. Neuropsych: This patient is somewhat capable of making decisions on his own behalf. 6. Skin/Wound Care: no open skin areas- however has an area of irritation near anus- Stage I but TINY- will monitor 7. Fluids/Electrolytes/Nutrition: regular diet with thin liquids- need SLP for dysarthria/dysphagia and cognition 8.  Neurogenic bowel and bladder.  Suppository nightly as needed, MiraLAX daily.  Check PVR's  6/23- PVRs ordered, but don't see recorded in chart- will order again, since need to know if pt retaining- having many incontinent stools- will d/w pt/parents about putting on bowel program.   6/24- d/w pt and father about bowel program - 2nd time saw pt- discussed at length. They will think about it.  6/25- will start bowel program per pt request.   6/26-  went well last night- con't regimen  6/27- responding well to bowel program; needing occ I/o caths as well per nursing  6/29- needs to do bowel program- refused las tnight since thought getting control- explained if can go in bedpan, that's control- if not, he's just having accidents.  9.  Seizure disorder.  Keppra 1000 mg twice daily, Inderal 10 mg twice daily. Seizure precautions.  10.  Hyponatremia.  Continue sodium chloride tablets 3 times daily.  Follow-up chemistries- last Na 136 per father  6/23- Na 136- con't salt tabs 11. Neurofibromatosis-2- was getting steroids- per Dr Prince Rome can reduce by 20% every few days;  -will also try and contact Dr Shaaron Adler about possible additional chemotherapy- Dr Prince Rome was CLEAR can use estim, since not cancerous dx.   6/23- will wean Decadron to 4 mg q12 hours from q8 hours for 2 days, then con't to wean.  6/25- decrease steroids due to 2mg  q12 hours today   6/27- will decrease to 2 mg daily. In 3 days, will stop, if tolerating well, esp since got thrush from steroids.  12. Impaired function             -will order grey call bell for pt.  13. Hyperkalemia  6/23- K+ 5.7- hemolyzed- will recheck in  AM  6/24- K= 4.3 14. Thrush  6/26- add diflucan 200 mg x1 then 100 mg daily x 6 days then nystatin S&S QID x 5 days.  6/29- thrush gone from oropharynx  15. Dysphagia: continue dysphagia 3 diet and SLP treatment.  6/29- needs to get meds crushed in pudding/applesauce     LOS: 7 days A FACE TO FACE EVALUATION WAS PERFORMED  Caron Tardif 05/16/2020, 10:04 AM

## 2020-05-16 NOTE — Progress Notes (Signed)
Patient refused suppository today and just stated that he didn't need it.

## 2020-05-16 NOTE — Progress Notes (Signed)
Physical Therapy Session Note  Patient Details  Name: Cameron Hale [name]: [id number] Date of Birth: 07-23-1976  Today's Date: 05/16/2020 PT Individual Time: 1130-1142 PT Individual Time Calculation (min): 12 min   Short Term Goals: Week 1:  PT Short Term Goal 1 (Week 1): Pt will complete bed mobility with assist x 1 consistently PT Short Term Goal 2 (Week 1): Pt will maintain static sitting balance with mod A x 5 min PT Short Term Goal 3 (Week 1): Pt will initiate w/c mobility as safe and able  Skilled Therapeutic Interventions/Progress Updates:  Pt received seated in TIS W/C in room with mother. Agreeable to therapy. Pt states his pain is "pretty good." Slide board transfer W/C to bed with A x2 with total A x1 and mod A x1. Sit to supine in bed with A x2 with total A x1 and min A x1. While supine in bed, NT enters room to perform bladder scan which shows >300 milliliters urine necessitating Foley cath. Pt left in room under care of NT. Pt missed 18 minutes of scheduled therapy session for nursing care.  Therapy Documentation Precautions:  Precautions Precautions: Fall Precaution Comments: seizure precautions Restrictions Weight Bearing Restrictions: No General: PT Amount of Missed Time (min): 18 Minutes PT Missed Treatment Reason: Nursing care  Therapy/Group: Individual Therapy  Bladen Umar, SPT  05/16/2020, 11:46 AM

## 2020-05-16 NOTE — Progress Notes (Signed)
Occupational Therapy Weekly Progress Note  Patient Details  Name: Cameron Hale [name]: [id number] Date of Birth: 10/12/1976  Beginning of progress report period: May 10, 2020 End of progress report period: May 16, 2020  Patient has met 1 of 3 short term goals.  Pt progress has been slow but consistent during the past week. Pt has demonstrated increased bed mobility and static sitting balance. Pt requires max A for rolling R/L in bed and requires max A for supine>sit EOB in preparation for SB transfers with +2 assistance. Pt maintains static sitting balance EOB and EOM for approx 4 mins with max A. Pt requires tot A for self care at bed level and sitting in w/c for UB. Pt has been issued a R wrist support brace to stabilize R wrist for self feeding and functional tasks.  Patient continues to demonstrate the following deficits: muscle weakness, decreased cardiorespiratoy endurance, abnormal tone, decreased coordination and decreased motor planning, decreased initiation, decreased problem solving and decreased safety awareness and decreased sitting balance, decreased standing balance, decreased postural control and decreased balance strategies and therefore will continue to benefit from skilled OT intervention to enhance overall performance with Reduce care partner burden.  Patient progressing toward long term goals..  Continue plan of care.  OT Short Term Goals Week 1:  OT Short Term Goal 1 (Week 1): Pt will perform UB self care with max A. OT Short Term Goal 1 - Progress (Week 1): Progressing toward goal OT Short Term Goal 2 (Week 1): Pt will maintain static sitting balance with max A on EOB for 3 minutes. OT Short Term Goal 2 - Progress (Week 1): Met OT Short Term Goal 3 (Week 1): Pt will utilized AE and modifications as needed for self feeding with mod A. OT Short Term Goal 3 - Progress (Week 1): Progressing toward goal Week 2:  OT Short Term Goal 1 (Week 2): Pt will perform UB self care with  max A. OT Short Term Goal 2 (Week 2): Pt will maintain static sitting balance with min A on EOB for 3 minutes. OT Short Term Goal 3 (Week 2): Pt will utilized AE and modifications as needed for self feeding with mod A.   Lanier, Thomas Chappell 05/16/2020, 6:57 AM  

## 2020-05-16 NOTE — Progress Notes (Signed)
Occupational Therapy Session Note  Patient Details  Name: Cameron Hale [name]: [id number] Date of Birth: 19-Jan-1976  Today's Date: 05/16/2020 OT Individual Time: 2500-3704 OT Individual Time Calculation (min): 70 min    Short Term Goals: Week 2:  OT Short Term Goal 1 (Week 2): Pt will perform UB self care with max A. OT Short Term Goal 2 (Week 2): Pt will maintain static sitting balance with min A on EOB for 3 minutes. OT Short Term Goal 3 (Week 2): Pt will utilized AE and modifications as needed for self feeding with mod A.  Skilled Therapeutic Interventions/Progress Updates:    Pt resting in bed and ready to participate in therapy.  LB dressing at bed level with tot A. Max A for rolling R/L using bed rails. Supine>sit EOB with max/tot A. Sitting balance with min A in preparation for SB transfer with +2 A. UB dressing seated in w/c with max A.  Pt initiates threading BUE into sleeves but requires max A to perform task.  Pt tot A for washing face and max A for oral care using R wrist support with u-cuff. RUE NMR with facilitated with use of estim on wrist/finger flexors with improvement noted.  Pt with weak grasp 3-/5 and trace extension after NMES. NMES trialed for extension with no positive results.  1:1 NMES applied to R wrist/finger flexors to facilitate functional grasp Ratio 1:3 Rate 35 pps Waveform- Asymmetric Ramp 1.0 Pulse 300 Intensity- 20 Duration - 15   Report of pain at the beginning of session  0/10 Report of pain at the end of session 0/10  No adverse reactions after treatment and is skin intact.   Pt returned to room and remained in w/c with belt alarm activated.  Soft call bell within reach and pt able to activate.   Therapy Documentation Precautions:  Precautions Precautions: Fall Precaution Comments: seizure precautions Restrictions Weight Bearing Restrictions: No   Therapy/Group: Individual Therapy  Leroy Libman 05/16/2020, 9:49 AM

## 2020-05-16 NOTE — Progress Notes (Signed)
Speech Language Pathology Daily Session Note  Patient Details  Name: Cameron Hale [name]: [id number] Date of Birth: 04/23/1976  Today's Date: 05/16/2020 SLP Individual Time: 2119-4174 SLP Individual Time Calculation (min): 55 min  Short Term Goals: Week 1: SLP Short Term Goal 1 (Week 1): Patient will consume current diet with minimal overt s/s of aspiration with overall supervision level verbal cues. SLP Short Term Goal 2 (Week 1): Patient will participate in RMT testing SLP Short Term Goal 2 - Progress (Week 1): Met SLP Short Term Goal 3 (Week 1): Patient will utilize diaphramatic breathing at the word level in 50% of opportunities with Mod A verbal cues. SLP Short Term Goal 4 (Week 1): Patient will perform 15 repetitions of EMST at 5 cm H2O with Max A multimodal cues with a self-perceived effort level of 7/10 without reports of dizziness, etc. SLP Short Term Goal 5 (Week 1): Patient will perform 15 repetitions of IMST at 9 cm H2O with Max A multimodal cues with a self-perceived effort level of 7/10 without reports of dizziness, etc.  Skilled Therapeutic Interventions: Pt was seen for skilled ST targeting communication goals. Pt performed 15 repetitions of IMST exercises with device set to 9cm H2O resistance and self-reported difficulty rating level of 9/10. He also performed 20 repetitions of EMST exercises with device set to 5cm H2O resistance and self-reported difficulty rating level of 2/10. SLP determined new appropriate new EMST resistance level to be 6cm H2O, which pt self-reported difficulty rating to be 7/10. Recommend IMST device remain set to 5cm H2O and increased EMST to 6cm H2O for future exercises. Pt required Min A verbal cues for use of diaphragmatic breathing and ultimately increased vocal intensity to achieve 100% intelligibility during structured phrase level tasks. Intelligibility decreased to ~80-85% in more informal conversation. Due to decreased breath support, pt naturally  communicated at the phrase and sentence level in communication exchanges. Pt left sitting in chair with alarm set and needs within reach. Continue per current plan of care.        Pain Pain Assessment Pain Scale: Faces Faces Pain Scale: No hurt   Therapy/Group: Individual Therapy  Arbutus Leas 05/16/2020, 7:22 AM

## 2020-05-16 NOTE — Patient Care Conference (Signed)
Inpatient RehabilitationTeam Conference and Plan of Care Update Date: 05/16/2020   Time: 3:54 PM    Patient Name: Cameron Hale      Medical Record Number: [id number]  Date of Birth: [birth year] Sex: Male         Room/Bed: 4W22C/4W22C-01 Payor Info: Payor: MEDICARE / Plan: MEDICARE PART A AND B / Product Type: *No Product type* /    Admit Date/Time:  05/09/2020  1:48 PM  Primary Diagnosis:  Quadriplegia [facility])  Patient Active Problem List   Diagnosis Date Noted  . Incomplete paraplegia ([place]) 05/09/2020  . Quadriplegia ([place]) 05/09/2020  . Sensorineural hearing loss (SNHL) of right ear with unrestricted hearing of left ear 06/03/2019  . Tinnitus of right ear 06/03/2019  . Mixed conductive and sensorineural hearing loss of right ear with unrestricted hearing of left ear 05/04/2019  . Keratoma 05/01/2016  . Difficulty walking 05/01/2016  . Neuromyopathy ([place]) 05/01/2016  . 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 Acoustic neuroma ([place]) 03/14/2015  . 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 H/O arthrodesis 02/17/2014  . Paraparesis ([place]) 02/17/2014  . Kidney lump 07/03/2012  . NF2 (neurofibromatosis 2) ([place]) 09/23/2011    Expected Discharge Date: Expected Discharge Date: 06/07/20  Team Members Present: Physician leading conference: Dr. Courtney Heys Care Coodinator Present: Loralee Pacas, LCSWA;Anjel Pardo Creig Hines, RN, BSN, CRRN Nurse Present: Other (comment) PT Present: Excell Seltzer, PT OT Present: Roanna Epley, COTA;Julia Delshire, OT SLP Present: Charolett Bumpers, SLP PPS Coordinator present : Gunnar Fusi, SLP     Current Status/Progress Goal Weekly Team Focus  Bowel/Bladder   Pt is incontinent of bowel and bladder. Pt has PVR order for >250 3 times daily to cath or prn q8 no void to also cath.  To become more continent of bowel and bladder.  Assess tolieting needs often.    Swallow/Nutrition/ Hydration   dys 3 and thin, Min A  Mod I  carryover of swallow strategies and trials of regular   ADL's   bathing/dressing-tot A/dependent; SB transfers +2, static sitting balance-min A; R wrist support provided to facilitate use of RUE in functional tasks  bathing-mod A; UB dressing-min A; LB dressing-mod A; toileting-max A; toilet transfers-max A; self feeding-min A  bed mobility, functional tranfsers, sitting balance, BADL retraining, activity tolerance   Mobility   max A rolling, total A supine to/from sit, +2 SB transfer, static sitting balance min  mod A overall at w/c level  bed mobility, sitting balance, transfers   Communication   Mod A at word level speech intelligibility  Mod I  diaphramatic breathing and RMT   Safety/Cognition/ Behavioral Observations            Pain   Pt has experienced pain levels 2-10/10 for head and right hand at times.  To help bring pain level below 2/10.  To manage pt's pain level and use PRNS if pt needs them. Assess pain level q shift.   Skin   Pt has ecchymosis to left and right side of abdomen and some MASD to buttocks which we are using barrier cream.  To promote healing and preventing breakdown from occurring.  Assess skin q shift or prn. Use skin care products if needed.    Rehab Goals Patient on target to meet rehab goals: Yes *See Care Plan and progress notes for long and short-term goals.     Barriers to Discharge  Current Status/Progress Possible Resolutions Date  Resolved   Nursing                  PT  Medical stability;Incontinence;Neurogenic Bowel & Bladder                 OT Medical stability;Neurogenic Bowel & Bladder                SLP                Care Coordinator Decreased caregiver support;Lack of/limited family support parents are in 66s; pt wife is a Pharmacist, hospital and off until August; also has wt restrictions as 5 wks post op            Discharge Planning/Teaching Needs:  D/c location pending care  needs (i.e. home vs to parent's home).  Family education as recommended by therapy   Team Discussion:  Incontinent bowel/bladder. Refusing bowel medications. Family concerned about pts voice being horse, r/t thrush and is being treated. Max assist rolling, bed mobility,+2 assist slideboard transfer. Total/dependent bathing and dressing. Mod I swallowing. Working on coordinated breathing. Right wrist support added.   Revisions to Treatment Plan:  None.    Medical Summary Current Status: refused supp this AM- refused miralax and senokot- "doesn't want it"; family in room; Weekly Focus/Goal: PT- max assist roll; total otherwise including SB transfers- working sitting balance-; SLP- swallow- D3 thin; Min A currently; trials of regular; communication- mod A goals; Mod I  Barriers to Discharge: Behavior;Decreased family/caregiver support;Home enviroment access/layout;Neurogenic Bowel & Bladder;Incontinence;Weight bearing restrictions;Medication compliance  Barriers to Discharge Comments: working on bowel program and training pt to recognize he needs; d/c 7/21 Possible Resolutions to Barriers: OT- working on estim- for UEs; trying; R wrist support- new one from OT- total/dep assist ADLs; tries to help with shirt; sat unassisted for 90 seconds yesterday   Continued Need for Acute Rehabilitation Level of Care: The patient requires daily medical management by a physician with specialized training in physical medicine and rehabilitation for the following reasons: Direction of a multidisciplinary physical rehabilitation program to maximize functional independence : Yes Medical management of patient stability for increased activity during participation in an intensive rehabilitation regime.: Yes Analysis of laboratory values and/or radiology reports with any subsequent need for medication adjustment and/or medical intervention. : Yes   I attest that I was present, lead the team conference, and concur with  the assessment and plan of the team.   Cristi Loron 05/16/2020, 3:54 PM

## 2020-05-17 ENCOUNTER — Inpatient Hospital Stay (HOSPITAL_COMMUNITY): Payer: Medicare Other | Admitting: Speech Pathology

## 2020-05-17 ENCOUNTER — Inpatient Hospital Stay (HOSPITAL_COMMUNITY): Payer: Medicare Other | Admitting: Physical Therapy

## 2020-05-17 ENCOUNTER — Inpatient Hospital Stay (HOSPITAL_COMMUNITY): Payer: Medicare Other | Admitting: *Deleted

## 2020-05-17 ENCOUNTER — Inpatient Hospital Stay (HOSPITAL_COMMUNITY): Payer: Medicare Other

## 2020-05-17 LAB — BASIC METABOLIC PANEL
Anion gap: 10 (ref 5–15)
BUN: 15 mg/dL (ref 6–20)
CO2: 25 mmol/L (ref 22–32)
Calcium: 9.1 mg/dL (ref 8.9–10.3)
Chloride: 102 mmol/L (ref 98–111)
Creatinine, Ser: 0.34 mg/dL — ABNORMAL LOW (ref 0.61–1.24)
GFR calc Af Amer: >60 mL/min (ref >60)
GFR calc non Af Amer: >60 mL/min (ref >60)
Glucose, Bld: 116 mg/dL — ABNORMAL HIGH (ref 70–99)
Potassium: 4.5 mmol/L (ref 3.5–5.1)
Sodium: 137 mmol/L (ref 135–145)

## 2020-05-17 LAB — CBC WITH DIFFERENTIAL/PLATELET
Abs Immature Granulocytes: 0.46 10*3/uL — ABNORMAL HIGH (ref 0.00–0.07)
Basophils Absolute: 0.1 10*3/uL (ref 0.0–0.1)
Basophils Relative: 1 %
Eosinophils Absolute: 0 10*3/uL (ref 0.0–0.5)
Eosinophils Relative: 0 %
HCT: 36.8 % — ABNORMAL LOW (ref 39.0–52.0)
Hemoglobin: 11.9 g/dL — ABNORMAL LOW (ref 13.0–17.0)
Immature Granulocytes: 4 %
Lymphocytes Relative: 7 %
Lymphs Abs: 0.9 10*3/uL (ref 0.7–4.0)
MCH: 28.1 pg (ref 26.0–34.0)
MCHC: 32.3 g/dL (ref 30.0–36.0)
MCV: 86.8 fL (ref 80.0–100.0)
Monocytes Absolute: 0.9 10*3/uL (ref 0.1–1.0)
Monocytes Relative: 7 %
Neutro Abs: 10.8 10*3/uL — ABNORMAL HIGH (ref 1.7–7.7)
Neutrophils Relative %: 81 %
Platelets: 220 10*3/uL (ref 150–400)
RBC: 4.24 MIL/uL (ref 4.22–5.81)
RDW: 15.9 % — ABNORMAL HIGH (ref 11.5–15.5)
WBC: 13.2 10*3/uL — ABNORMAL HIGH (ref 4.0–10.5)
nRBC: 0 % (ref 0.0–0.2)

## 2020-05-17 MED ORDER — LEVETIRACETAM 100 MG/ML PO SOLN: 1500.0000 mg | Freq: Two times a day (BID) | ORAL | Status: DC

## 2020-05-17 MED ORDER — LEVETIRACETAM IN NACL 1500 MG/100ML IV SOLN
1500.0000 mg | Freq: Two times a day (BID) | INTRAVENOUS | Status: DC
  Administered 2020-05-17 – 2020-05-20 (×6): 1500 mg via INTRAVENOUS
  Filled 2020-05-17 (×7): qty 100

## 2020-05-17 NOTE — Progress Notes (Signed)
Patient ID: Cameron Hale, male   DOB: May 02, 1976, 44 y.o.   [name]: [id number]  SW called pt wife Cameron Hale [phone]) to provide updates from team conference, and d/c date 06/07/2020. SW discussed recommended DME: ramp, hoyer lift and hospital bed. She reports they have a ramped entrance at her home and his parents home. She intends to speak with his parents to discuss d/c plan as other DME items will need to be delivered. SW discussed family education the week before discharge. SW waiting on follow-up on d/c location.   SW made attempts to meet with pt to give updates on d/c, however, pt was lethargic.   Cameron Hale, MSW, [place] Office: [phone] Cell: [phone] Fax: [phone]

## 2020-05-17 NOTE — Procedures (Signed)
ELECTROENCEPHALOGRAM REPORT   Patient: Cameron Hale       Room #: 4N82N EEG No. ID: [id number] Age: 44 y.o.        Sex: male Requesting Physician: Lovorn Report Date:  05/17/2020        Interpreting Physician: Alexis Goodell  History: Cameron Hale is an 44 y.o. male with altered mental status  Medications:  Keppra, Baclofen, Decadron, Diflucan, Inderal  Conditions of Recording:  This is a 21 channel routine scalp EEG performed with bipolar and monopolar montages arranged in accordance to the international 10/20 system of electrode placement. One channel was dedicated to EKG recording.  The patient is in the awake and asleep states.  Description:  At the initiation of the recording the background has the appearance of normal stage two sleep with the background consisting of slow irregular activity that is diffusely distributed with superimposed symmetrical sleep spindles and vertex central sharp transients.  The patient awakens towards the end of the recording and although there is a predominacne of muscle and movement artifact a posterior background rhythm can be seen at 8-9 Hz alpha.   No epileptiform activity is noted. Hyperventilation and intermittent photic stimulation were not performed.  IMPRESSION: Normal electroencephalogram, awake, asleep. There are no focal lateralizing or epileptiform features.   Alexis Goodell, MD Neurology [phone] 05/17/2020, 3:50 PM

## 2020-05-17 NOTE — Progress Notes (Signed)
Occupational Therapy Session Note  Patient Details  Name: Cameron Hale [name]: [id number] Date of Birth: 23-Apr-1976  Today's Date: 05/17/2020 OT Individual Time: 9983-3825 OT Individual Time Calculation (min): 70 min    Short Term Goals: Week 2:  OT Short Term Goal 1 (Week 2): Pt will perform UB self care with max A. OT Short Term Goal 2 (Week 2): Pt will maintain static sitting balance with min A on EOB for 3 minutes. OT Short Term Goal 3 (Week 2): Pt will utilized AE and modifications as needed for self feeding with mod A.  Skilled Therapeutic Interventions/Progress Updates:    Pt resting in bed upon arrival and ready to get OOB. OT intervention with focus on bed mobility, sitting balance EOB, SB transfers, grooming at sink.  Modifications to R wrist support to prevent skin breakdown. Pt required max A for brushing teeth with toothbrush in u-cuff. Pt tot A for washing face. BUE AAROM/PROM for strengthening/ROM and increase functional use in BADLs. Rolling R/L in bed with max A for donning pants.  Supine>sit EOB with max A. Sitting balance EOB with min A. SB transfer with tot A+2. Pt remained in w/c with all needs within reach and belt alarm activated.   Therapy Documentation Precautions:  Precautions Precautions: Fall Precaution Comments: seizure precautions Restrictions Weight Bearing Restrictions: No  Pain: Pt denies pain this morning  Therapy/Group: Individual Therapy  Leroy Libman 05/17/2020, 9:38 AM

## 2020-05-17 NOTE — Progress Notes (Signed)
Protocol initiated for MEWs of 3.

## 2020-05-17 NOTE — Progress Notes (Signed)
[facility] PHYSICAL MEDICINE & REHABILITATION PROGRESS NOTE   Subjective/Complaints:  Pt reports this AM- was refusing Bowel program because thought since was going- didn't need bowel program.   Educated regarding this- needs to do bowel program to reduce amount of accidents/incontinence- pt reported was willing to try again.   Saw a note due to severe difficulty waking pt up ~ 1:30 pm today- pt does have hx of seizures- and either is having a seizure since was so awake this AM, or abrupt onset of infection. Asked PA to consult Neurology.     ROS:  Pt denies SOB, abd pain, CP, N/V/C/D, and vision changes    Objective:   No results found. No results for input(s): WBC, HGB, HCT, PLT in the last 72 hours. No results for input(s): NA, K, CL, CO2, GLUCOSE, BUN, CREATININE, CALCIUM in the last 72 hours.  Intake/Output Summary (Last 24 hours) at 05/17/2020 1347 Last data filed at 05/17/2020 1244 Gross per 24 hour  Intake 840 ml  Output 1150 ml  Net -310 ml     Physical Exam: Vital Signs Blood pressure 113/86, pulse (!) 59, temperature (!) 95.6 F (35.3 C), temperature source Rectal, resp. rate 14, height (P) 5\' 10"  (1.778 m), weight (P) 61.5 kg, SpO2 100 %. General: sitting up in power w/c- about to brush teeth with OT in room, NAD HEENT: head scars from previous incisions and neck incision looks good-thrush gone Heart: borderline bradycardia- regular rhythm Chest: CTA B/L- no W/R/R- good air movement Abdomen: Soft, NT, ND, (+)BS   Musculoskeletal:     Cervical back: Normal range of motion and neck supple. No rigidity.     Comments: Full strength exam done RUE- biceps 4-/5, WE 2-/5, triceps 1/5, grip 2-/5, finger abd 1/5 LUE- biceps 3/5, WE 2-/5, triceps 2-/5, grip 1/5, finger abd 1/5 RLE- HF, KE, DF, PF all 1/5 LLE- HF 2-/5; otherwise 0/5 in LLE  Neurological:Ox2 Hoffman's in RUE- not LUE No clonus; MAS of 1 in UEs and 1+ in LEs Sensation to light touch impaired from C5  downwards- gets less notable as goes down torso/legs. - Skin: Skin is warm and dry.  Tiny, tiny area that's pink around anus- blanchable- skin tear- not open- not assessed since up in w/c.  Psychiatric: smiling     Assessment/Plan: 1. 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  Physiatrist is providing close team supervision and 24 hour management of active medical problems listed below.  Physiatrist and rehab team continue to assess barriers to discharge/monitor patient progress toward functional and medical goals  Care Tool:  Bathing  Bathing activity did not occur: Safety/medical concerns     Body parts bathed by helper: Right arm, Left arm, Chest, Abdomen, Front perineal area, Buttocks, Right upper leg, Left upper leg, Right lower leg, Left lower leg, Face     Bathing assist Assist Level: Total Assistance - Patient < 25%     Upper Body Dressing/Undressing Upper body dressing   What is the patient wearing?: Pull over shirt    Upper body assist Assist Level: Total Assistance - Patient < 25%    Lower Body Dressing/Undressing Lower body dressing      What is the patient wearing?: Incontinence brief, Pants     Lower body assist Assist for lower body dressing: Dependent - Patient 0%     Toileting Toileting Toileting Activity did  not occur Landscape architect and hygiene only): N/A (no void or bm)  Toileting assist Assist for toileting: 2 Helpers     Transfers Chair/bed transfer  Transfers assist     Chair/bed transfer assist level: 2 Helpers (slideboard)     Locomotion Ambulation   Ambulation assist   Ambulation activity did not occur: Safety/medical concerns          Walk 10 feet activity   Assist  Walk 10 feet activity did not occur: Safety/medical concerns        Walk 50 feet  activity   Assist Walk 50 feet with 2 turns activity did not occur: Safety/medical concerns         Walk 150 feet activity   Assist Walk 150 feet activity did not occur: Safety/medical concerns         Walk 10 feet on uneven surface  activity   Assist Walk 10 feet on uneven surfaces activity did not occur: Safety/medical concerns         Wheelchair     Assist Will patient use wheelchair at discharge?: Yes Type of Wheelchair: Manual Wheelchair activity did not occur: Safety/medical concerns  Wheelchair assist level: Dependent - Patient 0% Max wheelchair distance: 133ft    Wheelchair 50 feet with 2 turns activity    Assist    Wheelchair 50 feet with 2 turns activity did not occur: Safety/medical concerns   Assist Level: Dependent - Patient 0%   Wheelchair 150 feet activity     Assist  Wheelchair 150 feet activity did not occur: Safety/medical concerns   Assist Level: Dependent - Patient 0%   Blood pressure 113/86, pulse (!) 59, temperature (!) 95.6 F (35.3 C), temperature source Rectal, resp. rate 14, height (P) 5\' 10"  (1.778 m), weight (P) 61.5 kg, SpO2 100 %.  Medical Problem List and Plan: 1.  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  Antibiotic therapy completed 04/28/2020- parents say 6/14- PICC out             -patient may  shower             -ELOS/Goals: goals min-mod A- 3-4 weeks  -Continue CIR 2.  Antithrombotics: -DVT/anticoagulation: Subcutaneous heparin.  Check vascular study 6/23- (-) for DVTs 6/26- change to Lovenox since no renal issues and not allergic             -antiplatelet therapy: n/a 3. Pain Management: Baclofen 10 mg twice daily for spasticity.  Well controlled.  4. Mood: not on any meds             -antipsychotic agents: n/a 5. Neuropsych: This patient is somewhat capable of making decisions on his own behalf. 6. Skin/Wound Care: no open skin areas- however has an area of irritation near anus- Stage I but TINY- will monitor 7. Fluids/Electrolytes/Nutrition: regular diet with thin liquids- need SLP for dysarthria/dysphagia and cognition 8.  Neurogenic bowel and bladder.  Suppository nightly as needed, MiraLAX daily.  Check PVR's  6/23- PVRs ordered, but don't see recorded in chart- will order again, since need to know if pt retaining- having many incontinent stools- will d/w pt/parents about putting on bowel program.   6/24- d/w pt and father about bowel program - 2nd time saw pt- discussed at length. They will  think about it.  6/25- will start bowel program per pt request.   6/26- went well last night- con't regimen  6/27- responding well to bowel program; needing occ I/o caths as well per nursing  6/29- needs to do bowel program- refused las tnight since thought getting control- explained if can go in bedpan, that's control- if not, he's just having accidents.   6/30- d/w pt- he's willing again to try bowel program 9.  Seizure disorder.  Keppra 1000 mg twice daily, Inderal 10 mg twice daily. Seizure precautions.   6/30- pt had episode of lethargy/sedation- couldn't wake him up, even with sternal rub, was documented- will consult Neurology- due to hx of seizures- and also order labs- to make sure no infection- recent hx of menigitis 10.  Hyponatremia.  Continue sodium chloride tablets 3 times daily.  Follow-up chemistries- last Na 136 per father  6/23- Na 136- con't salt tabs 11. Neurofibromatosis-2- was getting steroids- per Dr Prince Rome can reduce by 20% every few days;  -will also try and contact Dr Shaaron Adler about possible additional chemotherapy- Dr Prince Rome was CLEAR can use estim, since not cancerous dx.   6/23- will wean Decadron to 4  mg q12 hours from q8 hours for 2 days, then con't to wean.  6/25- decrease steroids due to 2mg  q12 hours today   6/27- will decrease to 2 mg daily. In 3 days, will stop, if tolerating well, esp since got thrush from steroids.   6/30- can stop Decadron as of 7/2- if things going well-  12. Impaired function             -will order grey call bell for pt.  13. Hyperkalemia  6/23- K+ 5.7- hemolyzed- will recheck in AM  6/24- K= 4.3 14. Thrush  6/26- add diflucan 200 mg x1 then 100 mg daily x 6 days then nystatin S&S QID x 5 days.  6/29- thrush gone from oropharynx  15. Dysphagia: continue dysphagia 3 diet and SLP treatment.  6/29- needs to get meds crushed in pudding/applesauce     LOS: 8 days A FACE TO FACE EVALUATION WAS PERFORMED  Neilani Duffee 05/17/2020, 1:47 PM

## 2020-05-17 NOTE — Progress Notes (Signed)
Rectal vault cleared, dig stim completed, suppository administered. No results at this time. Will inform night shift to continue plan of care.

## 2020-05-17 NOTE — Significant Event (Addendum)
Rapid Response Event Note  Overview: Neurologic - Decreased LOC - Increased Lethargy  Initial Focused Assessment: Called to bedside to assess patient for increased lethargy and decreased LOC. Upon arrival, patient was hard to arouse but after 1-2 minutes, he was more awake and able to talk. His mouth and oral mucosa were dry, I gave him an ice cube to suck on. Cameron Hale was able to tell me his name, his birth-date, and where he was. He denied pain and he denied feeling bad. I asked him he felt like he had a seizure, because his wife informed me that Cameron Hale can tell when he is about to have a seizure but he endorsed that he did not get that feeling today. There have been no new adjustments to his medications. Labs, EEG, and an extra dose of Keppra IV were already ordered. Cameron Hale could follow commands - able to open eyes to command and stick his tongue out. He is now back to his previous baseline. No new visual changes, legally blind in the LT eye, and cannot hear in the RT ear at baseline.   VS BP 120/95(104), HR 62, 100% RA, RR 18 - multiple attempts to obtain a temperature both rectally and orally - I was not able to obtain one. He did feel cool to touch in all extremities but per his wife and him- he is normally cool to touch. I applied two warm blankets on him for now. + pulses palpable.   Interventions: -- No RRT Interventions  Plan of Care: -- Monitor VS and neurologic status -- Rest per medical team  Event Summary:  Call Time 1540 Arrival Time 1542 End Time 1659   Lisanne Ponce R

## 2020-05-17 NOTE — Progress Notes (Signed)
Physical Therapy Session Note  Patient Details  Name: Cameron Hale [name]: [id number] Date of Birth: 04/07/1976  Today's Date: 05/17/2020 PT Individual Time: 1100-1150 PT Individual Time Calculation (min): 50 min  PT Missed Time: 10 minutes Missed Time Reason: Pt lethargy  Short Term Goals: Week 1:  PT Short Term Goal 1 (Week 1): Pt will complete bed mobility with assist x 1 consistently PT Short Term Goal 2 (Week 1): Pt will maintain static sitting balance with mod A x 5 min PT Short Term Goal 3 (Week 1): Pt will initiate w/c mobility as safe and able  Skilled Therapeutic Interventions/Progress Updates:  Pt received seated in TIS W/C. Agreeable to therapy. Denies any pain. R wrist support in place to RUE- assessed skin for redness and none present. TED hose in place to BLEs. Pt is extremely drowsy limiting participation in today's session despite multiple verbal and tactile stimuli. B/L AFOs dependently donned at W/C level by therapist. Dependent slide board transfer TIS W/C to EOB d/t decreased pt arousal. Sit to supine dependently with A x2. Once in supine, pt exhibits increased arousal but returns to lethargic state within a few minutes. Supine in bed, BP is 99/74 with a HR of 70. Attempted to tilt K[employer] Lift bed in order to promote weightbearing through LEs, however the bed was malfunctioning; contacted [employer] representative about bed malfunction. B/L AFOs dependently doffed at bed level by therapist. PRAFOs applied to BLEs by therapist. RN notified of pt's lethargy this session. Per RN, pt has no new medications that would cause lethargy. Pt left supine in bed with all needs in reach, resting comfortably. Pt missed 10 minutes of scheduled therapy session d/t extreme drowsiness limiting participation.  Therapy Documentation Precautions:  Precautions Precautions: Fall Precaution Comments: seizure precautions Restrictions Weight Bearing Restrictions: No  Therapy/Group: Individual  Therapy  Augusto Deckman, SPT  05/17/2020, 12:55 PM

## 2020-05-17 NOTE — Progress Notes (Signed)
Reported by staff for some increased lethargy today.  Patient will arouse with sternal rub but keep his eyes closed.  Patient does have history of seizures.  Spoke with neurology services recommendations are to increase Keppra to 1500 mg twice daily and check EEG.  If EEG positive then loaded with Keppra.

## 2020-05-17 NOTE — Progress Notes (Signed)
Therapy concerned this am that pt unable to participate in therapy due to lethargy. Reviewed medications and Robaxin 10mg  PO was given in acute and is not new to pt. Pt on Keppra and is not new. Therapy again notified that unable to get pt awake enough to eat. VS taken, rectal temp taken due to pt feeling cool to the touch. Tried sternal rubs and was unsuccessful in getting pt to respond, eyes open and mouth open with non volentary movements.  Notified Dan PA=c with new orders.

## 2020-05-17 NOTE — Progress Notes (Signed)
EEG complete - results pending 

## 2020-05-17 NOTE — Progress Notes (Signed)
Rapid response RN at [facility]. She states in regards to PIV "flushes and draws back blood. I can start a new one if I need to. I will have nurse run medication while I am here." Notified patient's nurse of PIV POC. Instructed to consult [facility]AST with further concerns or questions. VU. Fran Lowes, RN

## 2020-05-17 NOTE — Progress Notes (Signed)
Speech Language Pathology Daily Session Note  Patient Details  Name: Cameron Hale [name]: [id number] Date of Birth: 03-Jan-1976  Today's Date: 05/17/2020  Session 1: SLP Individual Time: 1848-5927 SLP Individual Time Calculation (min): 25 min   Short Term Goals: Week 1: SLP Short Term Goal 1 (Week 1): Patient will consume current diet with minimal overt s/s of aspiration with overall supervision level verbal cues. SLP Short Term Goal 2 (Week 1): Patient will participate in RMT testing SLP Short Term Goal 2 - Progress (Week 1): Met SLP Short Term Goal 3 (Week 1): Patient will utilize diaphramatic breathing at the word level in 50% of opportunities with Mod A verbal cues. SLP Short Term Goal 4 (Week 1): Patient will perform 15 repetitions of EMST at 5 cm H2O with Max A multimodal cues with a self-perceived effort level of 7/10 without reports of dizziness, etc. SLP Short Term Goal 5 (Week 1): Patient will perform 15 repetitions of IMST at 9 cm H2O with Max A multimodal cues with a self-perceived effort level of 7/10 without reports of dizziness, etc.  Skilled Therapeutic Interventions:  Session 1: Skilled treatment session focused on speech goals. SLP facilitated session by providing extra time for patient to complete 25 repetitions of IMST at 9 cm H2O with a self-perceived effort level of 9/10 and 25 repetitions of EMST at 7 cm H2O (incresed today) with a self-perceived effort left of 8/10. Overall, patient demonstrated increased vocal intensity throughout the session to maximize intelligibility to ~90% at the phrase level. Patient left upright in wheelchair with alarm on and all needs within reach. Continue with current plan of care.        Pain No/Denies Pain   Therapy/Group: Individual Therapy  Cameron Hale 05/17/2020, 10:06 AM

## 2020-05-17 NOTE — Progress Notes (Signed)
SLP Cancellation Note  Patient Details Name: Cameron Hale [name]: [id number] DOB: 1976/02/05   Cancelled treatment:       Patient missed 60 minutes of skilled SLP intervention due to fatigue. Patient unable to maintain arousal for more than 30 second intervals despite multiple attempts/stimuli. Patient did not verbalize at all throughout interaction, demonstrates involuntary movements of the oral musculature and felt cold to the touch. RN and PA aware. Vitals and rectal tempeture taken. Patient remains lethargic.                                                                                                Keishla Oyer, [place] 05/17/2020, 1:47 PM

## 2020-05-18 ENCOUNTER — Inpatient Hospital Stay (HOSPITAL_COMMUNITY): Payer: Medicare Other | Admitting: Occupational Therapy

## 2020-05-18 ENCOUNTER — Inpatient Hospital Stay (HOSPITAL_COMMUNITY): Payer: Medicare Other | Admitting: Physical Therapy

## 2020-05-18 ENCOUNTER — Inpatient Hospital Stay (HOSPITAL_COMMUNITY): Payer: Medicare Other | Admitting: Speech Pathology

## 2020-05-18 MED ORDER — HYDROCERIN EX CREA
TOPICAL_CREAM | Freq: Every day | CUTANEOUS | Status: DC
  Administered 2020-05-18 – 2020-05-30 (×3): 1 via TOPICAL
  Filled 2020-05-18: qty 113

## 2020-05-18 NOTE — Progress Notes (Signed)
Occupational Therapy Session Note  Patient Details  Name: Cameron Hale [name]: [id number] Date of Birth: [birth year]  Today's Date: 05/18/2020 OT Individual Time: 5102-5852 OT Individual Time Calculation (min): 72 min    Short Term Goals: Week 2:  OT Short Term Goal 1 (Week 2): Pt will perform UB self care with max A. OT Short Term Goal 2 (Week 2): Pt will maintain static sitting balance with min A on EOB for 3 minutes. OT Short Term Goal 3 (Week 2): Pt will utilized AE and modifications as needed for self feeding with mod A.  Skilled Therapeutic Interventions/Progress Updates:    Pt resting in bed upon arrival with RN present.  OT intervention with focus on bed mobility, sitting balance, SB transfers, self feeding with dorsal wrist support for RUE and adapted spoon, and activity tolerance to increase independence with BADLs. Bed mobility with max A to roll R/L for changing brief and donning pants.  Supine>sit EOB with tot A+2. Sitting balance min A to preparation for SB transfer.  Lateral lean to place SB with tot A. SB transfer to w/c with tot A+2. Dorsal wrist support donned and spoon bent to allow for scooping and bring to mouth.  Pt requires support at elbow.  Elbow at 90 degrees and OTA initates bring spoon to mouth and pt able to complete task with tactile facilitation for increased pronation and radial deviation. NT entered room at end of session and assisted pt with eating breakfast. Discussed plan for shower tomorrow in rolling shower chair.   Therapy Documentation Precautions:  Precautions Precautions: Fall Precaution Comments: seizure precautions Restrictions Weight Bearing Restrictions: No Pain:  Pt denies pain this morning   Therapy/Group: Individual Therapy  Leroy Libman 05/18/2020, 9:28 AM

## 2020-05-18 NOTE — Progress Notes (Addendum)
Physical Therapy Weekly Progress Note  Patient Details  Name: Cameron Hale [name]: [id number] Date of Birth: Jun 05, 1976  Beginning of progress report period: May 10, 2020 End of progress report period: May 18, 2020  Today's Date: 05/19/2020  Patient has met 2 of 3 short term goals. Pt performs bed mobility with A x2, transfers with A x2, and drove power W/C 150 feet in a controlled environment with min A. Have not attempted gait or stairs d/t safety/medical concern.  Patient continues to demonstrate the following deficits muscle weakness, decreased cardiorespiratoy endurance, impaired timing and sequencing, decreased coordination and decreased motor planning, decreased visual acuity and decreased sitting balance, decreased postural control and decreased balance strategies and therefore will continue to benefit from skilled PT intervention to increase functional independence with mobility.  Patient progressing toward long term goals..  Continue plan of care.  PT Short Term Goals Week 1:  PT Short Term Goal 1 (Week 1): Pt will complete bed mobility with assist x 1 consistently PT Short Term Goal 1 - Progress (Week 1): Progressing toward goal PT Short Term Goal 2 (Week 1): Pt will maintain static sitting balance with mod A x 5 min PT Short Term Goal 2 - Progress (Week 1): Met PT Short Term Goal 3 (Week 1): Pt will initiate w/c mobility as safe and able PT Short Term Goal 3 - Progress (Week 1): Met Week 2:  PT Short Term Goal 1 (Week 2): Pt will perform bed mobility with max A x1 consistently. PT Short Term Goal 2 (Week 2): Pt will maintain static sitting balance x10 minutes with mod A. PT Short Term Goal 3 (Week 2): Pt will drive 241 feet using power wheelchair with close supervision.  Therapy Documentation Precautions:  Precautions Precautions: Fall Precaution Comments: seizure precautions Restrictions Weight Bearing Restrictions: No   Therapy/Group: Individual Therapy  Nadeen Landau, SPT   Excell Seltzer, PT, DPT  05/19/2020, 7:54 AM

## 2020-05-18 NOTE — Progress Notes (Signed)
Patient ID: Cameron Hale, male   DOB: [birth year], 44 y.o.   [name]: [id number]  Met patient who was resting in bed and receiving scheduled dose of IV Keppra. Dose having difficulty going through 20 gage at 400 mL/hr. This RN helped to hold the IV catheter at a level that made it easier to flow into the vein, as the machine kept saying it was occluded. Dose ran for 15 min. and then flushed with normal saline. Patient slept during process.  Dorthula Nettles, RN, BSN, [place] Office [phone] Cell [phone]

## 2020-05-18 NOTE — Progress Notes (Signed)
Speech Language Pathology Weekly Progress and Session Note  Patient Details  Name: Cameron Hale [name]: [id number] Date of Birth: 10-29-1976  Beginning of progress report period: May 09, 2020 End of progress report period: May 18, 2020  Today's Date: 05/18/2020 SLP Individual Time: 1305-1400 SLP Individual Time Calculation (min): 55 min  Short Term Goals: Week 1: SLP Short Term Goal 1 (Week 1): Patient will consume current diet with minimal overt s/s of aspiration with overall supervision level verbal cues. SLP Short Term Goal 1 - Progress (Week 1): Met SLP Short Term Goal 2 (Week 1): Patient will participate in RMT testing SLP Short Term Goal 2 - Progress (Week 1): Met SLP Short Term Goal 3 (Week 1): Patient will utilize diaphramatic breathing at the word level in 50% of opportunities with Mod A verbal cues. SLP Short Term Goal 3 - Progress (Week 1): Met SLP Short Term Goal 4 (Week 1): Patient will perform 15 repetitions of EMST at 5 cm H2O with Max A multimodal cues with a self-perceived effort level of 7/10 without reports of dizziness, etc. SLP Short Term Goal 4 - Progress (Week 1): Met SLP Short Term Goal 5 (Week 1): Patient will perform 15 repetitions of IMST at 9 cm H2O with Max A multimodal cues with a self-perceived effort level of 7/10 without reports of dizziness, etc. SLP Short Term Goal 5 - Progress (Week 1): Met    New Short Term Goals: Week 2: SLP Short Term Goal 1 (Week 2): Patient will consume current diet with minimal overt s/s of aspiration with Mod I. SLP Short Term Goal 2 (Week 2): Patient will demonstrate efficient mastication with trials of regular textures with complete oral clearance without overt s/s of aspiration over 2 sessions prior to upgrade with supervision verbal cues. SLP Short Term Goal 3 (Week 2): Patient will utilize diaphramatic breathing at the phrase level with Mod A verbal cues to achieve ~75% intelligibility. SLP Short Term Goal 4 (Week 2): Patient  will perform 25 repetitions of EMST at 7 cm H2O with Max A multimodal cues with a self-perceived effort level of 7/10 without reports of dizziness, etc. SLP Short Term Goal 5 (Week 2): Patient will perform 25 repetitions of IMST at 13 cm H2O with Max A multimodal cues with a self-perceived effort level of 7/10 without reports of dizziness, etc.  Weekly Progress Updates: Patient has made excellent gains and has met 5 of 5 STGs this reporting period. Currently, patient is consuming Dys. 3 textures with thin liquids and minimal overt s/s of aspiration with overall supervision level verbal cues for use of swallowing compensatory strategies. Patient also demonstrates improved breath support and use of diaphragmatic breathing at the phrase level resulting in improved vocal intensity and overall speech intelligibility to ~90%.  Patient and family education ongoing. Patient would benefit from continued skilled SLP intervention to maximize his swallowing and speech function prior to discharge.     Intensity: Minumum of 1-2 x/day, 30 to 90 minutes Frequency: 3 to 5 out of 7 days Duration/Length of Stay: 06/07/20 Treatment/Interventions: Dysphagia/aspiration precaution training;Speech/Language facilitation;Therapeutic Activities;Environmental controls;Cueing hierarchy;Functional tasks;Patient/family education   Daily Session  Skilled Therapeutic Interventions: Skilled treatment session focused on speech goals. SLP facilitated session by with respiratory muscle re-testing. Patient's peak MIP was 23cm H2O and his peak MEP was 16 cm H2O. Patient's peak MIP and MEP have both improved since initial evaluation. Due to improvement, patient's IMST device was increased to 13 cm H2O. Patient performed 25 repetitions (  with nose clips and SLP assisting with lip seal) with a self-perceived effort level of 7/10. However, patient was unable to perform EMST exercises today, suspect due to fatigue. Patient's overall vocal  intensity/quality was diminished today and patient required Max A verbal cues for use of diaphragmatic breathing at the phrase level to achieve ~50% intelligiblity. Patient left upright in the wheelchair with alarm on and all needs within reach. Continue with current plan of care.   Pain No/Denies Pain   Therapy/Group: Individual Therapy  Cameron Hale 05/18/2020, 7:03 AM

## 2020-05-18 NOTE — Progress Notes (Signed)
Pt refused a suppository this evening due to having multiple bowel movements during the day. The pt had 3 BM with the last being soft and loose. Pt states bottom is sore and would like to resume tomorrow.

## 2020-05-18 NOTE — Progress Notes (Signed)
Physical Therapy Session Note  Patient Details  Name: Cameron Hale [name]: [id number] Date of Birth: 1976/01/25  Today's Date: 05/18/2020 PT Individual Time: 1100-1200 PT Individual Time Calculation (min): 60 min   Short Term Goals: Week 1:  PT Short Term Goal 1 (Week 1): Pt will complete bed mobility with assist x 1 consistently PT Short Term Goal 2 (Week 1): Pt will maintain static sitting balance with mod A x 5 min PT Short Term Goal 3 (Week 1): Pt will initiate w/c mobility as safe and able  Skilled Therapeutic Interventions/Progress Updates:  Pt received seated in TIS W/C in room. Agreeable to therapy. Denies any pain. Dependent TIS W/C transfer to therapy gym for time conservation. Slide board transfer from TIS W/C to mat table with A x2 with total A x1 and mod A x1. Slide board transfer from elevated mat table with step placed under patient's feet for support to power W/C with A x2 with total A x1 and mod A x1. Pt instructed in how to turn on and drive W/C. Pt exhibits difficulty controlling joystick due to RUE strength and coordination impairments. Added 2-inch stress ball on joystick for improved grip as well as use of R wrist splint for improved control. Pt exhibits improved ability to control steering on power w/c with adjustments but does still require min A for steering at times. Pt is able to drive x 010 ft with multiple turns in an open clutter-free environment at min A level. Reviewed pressure relief boosting scheduled with pt who reports good recall of schedule. Pt is unable to switch power w/c from drive mode to seat adjustment mode due to UE weakness and visual deficits. Once w/c switched over to seat adjustment mode he is able to put chair into full tilt and recline for pressure relief. However, pt unable to return power w/c to upright position due to RUE weakness and inability to lift UE against gravity to reach controller. Pt also exhibits onset of RUE fatigue following driving this  session and requires assist to complete distance driving back to his room at end of therapy session. Pt left semi-reclined in power w/c in room with needs in reach at end of session.   Therapy Documentation Precautions:  Precautions Precautions: Fall Precaution Comments: seizure precautions Restrictions Weight Bearing Restrictions: No  Therapy/Group: Individual Therapy  Eliyahu Bille, SPT 05/18/2020, 4:55 PM

## 2020-05-18 NOTE — Progress Notes (Signed)
[facility] PHYSICAL MEDICINE & REHABILITATION PROGRESS NOTE   Subjective/Complaints: No complaints this morning. Had BM this morning. Denies dysuria, SOB. Discussed that WBC increased yesterday. Says yesterday's episode did not feel like a seizure to him. Discussed that EEG was negative.   ROS:  Pt denies SOB, abd pain, CP, N/V/C/D, and vision changes    Objective:   EEG  Result Date: 05/17/2020 Cameron Goodell, MD     05/17/2020  4:09 PM ELECTROENCEPHALOGRAM REPORT Patient: Cameron Hale       Room #: 4I34V EEG No. ID: [id number] Age: 44 y.o.        Sex: male Requesting Physician: Lovorn Report Date:  05/17/2020       Interpreting Physician: Cameron Hale History: Cameron Hale is an 44 y.o. male with altered mental status Medications: Keppra, Baclofen, Decadron, Diflucan, Inderal Conditions of Recording:  This is a 21 channel routine scalp EEG performed with bipolar and monopolar montages arranged in accordance to the international 10/20 system of electrode placement. One channel was dedicated to EKG recording. The patient is in the awake and asleep states. Description:  At the initiation of the recording the background has the appearance of normal stage two sleep with the background consisting of slow irregular activity that is diffusely distributed with superimposed symmetrical sleep spindles and vertex central sharp transients.  The patient awakens towards the end of the recording and although there is a predominacne of muscle and movement artifact a posterior background rhythm can be seen at 8-9 Hz alpha.  No epileptiform activity is noted. Hyperventilation and intermittent photic stimulation were not performed. IMPRESSION: Normal electroencephalogram, awake, asleep. There are no focal lateralizing or epileptiform features. Cameron Goodell, MD Neurology [phone] 05/17/2020, 3:50 PM   Recent Labs    05/17/20 1450  WBC 13.2*  HGB 11.9*  HCT 36.8*  PLT 220   Recent Labs     05/17/20 1450  NA 137  K 4.5  CL 102  CO2 25  GLUCOSE 116*  BUN 15  CREATININE 0.34*  CALCIUM 9.1    Intake/Output Summary (Last 24 hours) at 05/18/2020 0916 Last data filed at 05/18/2020 0112 Gross per 24 hour  Intake 90 ml  Output 1000 ml  Net -910 ml     Physical Exam: Vital Signs Blood pressure 126/87, pulse 73, temperature 97.9 F (36.6 C), resp. rate 16, height (P) 5\' 10"  (1.778 m), weight (P) 61.5 kg, SpO2 100 %. General: Alert and oriented x 3, No apparent distress HEENT: head scars from previous incisions and neck incision looks good-thrush gone Heart: borderline bradycardia- regular rhythm Abdomen: Soft, non-tender, non-distended, bowel sounds positive. Extremities: No clubbing, cyanosis, or edema. Pulses are 2+ Musculoskeletal:     Cervical back: Normal range of motion and neck supple. No rigidity.     Comments: Full strength exam done RUE- biceps 4-/5, WE 2-/5, triceps 1/5, grip 2-/5, finger abd 1/5 LUE- biceps 3/5, WE 2-/5, triceps 2-/5, grip 1/5, finger abd 1/5 RLE- HF, KE, DF, PF all 1/5 LLE- HF 2-/5; otherwise 0/5 in LLE  Neurological:Ox2 Hoffman's in RUE- not LUE No clonus; MAS of 1 in UEs and 1+ in LEs Sensation to light touch impaired from C5 downwards- gets less notable as goes down torso/legs. - Skin: Skin is warm and dry.  Tiny, tiny area that's pink around anus- blanchable- skin tear- not open- not assessed since up in w/c.  Psychiatric: smiling   Assessment/Plan: 1. 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  Physiatrist is providing close team supervision and 24 hour management of active medical problems listed below.  Physiatrist and rehab team continue to assess barriers to discharge/monitor patient progress toward functional and medical goals  Care Tool:  Bathing  Bathing activity did not occur:  Safety/medical concerns     Body parts bathed by helper: Right arm, Left arm, Chest, Abdomen, Front perineal area, Buttocks, Right upper leg, Left upper leg, Right lower leg, Left lower leg, Face     Bathing assist Assist Level: Total Assistance - Patient < 25%     Upper Body Dressing/Undressing Upper body dressing   What is the patient wearing?: Pull over shirt    Upper body assist Assist Level: Total Assistance - Patient < 25%    Lower Body Dressing/Undressing Lower body dressing      What is the patient wearing?: Incontinence brief, Pants     Lower body assist Assist for lower body dressing: Dependent - Patient 0%     Toileting Toileting Toileting Activity did not occur (Clothing management and hygiene only): N/A (no void or bm)  Toileting assist Assist for toileting: 2 Helpers     Transfers Chair/bed transfer  Transfers assist     Chair/bed transfer assist level: Dependent - Patient 0% (two helpers)     Locomotion Ambulation   Ambulation assist   Ambulation activity did not occur: Safety/medical concerns          Walk 10 feet activity   Assist  Walk 10 feet activity did not occur: Safety/medical concerns        Walk 50 feet activity   Assist Walk 50 feet with 2 turns activity did not occur: Safety/medical concerns         Walk 150 feet activity   Assist Walk 150 feet activity did not occur: Safety/medical concerns         Walk 10 feet on uneven surface  activity   Assist Walk 10 feet on uneven surfaces activity did not occur: Safety/medical concerns         Wheelchair     Assist Will patient use wheelchair at discharge?: Yes Type of Wheelchair: Manual Wheelchair activity did not occur: Safety/medical concerns  Wheelchair assist level: Dependent - Patient 0% Max wheelchair distance: 155ft    Wheelchair 50 feet with 2 turns activity    Assist    Wheelchair 50 feet with 2 turns activity did not occur:  Safety/medical concerns   Assist Level: Dependent - Patient 0%   Wheelchair 150 feet activity     Assist  Wheelchair 150 feet activity did not occur: Safety/medical concerns   Assist Level: Dependent - Patient 0%   Blood pressure 126/87, pulse 73, temperature 97.9 F (36.6 C), resp. rate 16, height (P) 5\' 10"  (1.778 m), weight (P) 61.5 kg, SpO2 100 %.  Medical Problem List and Plan: 1.  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  Antibiotic therapy completed 04/28/2020- parents say 6/14- PICC out             -patient may  shower             -ELOS/Goals: goals min-mod A- 3-4 weeks  -Continue CIR  2.  Antithrombotics: -DVT/anticoagulation: Subcutaneous heparin.  Check vascular study 6/23- (-) for DVTs 6/26- change to Lovenox since no renal issues and not allergic             -antiplatelet therapy: n/a 3. Pain Management: Baclofen 10 mg twice daily for spasticity. Well controlled. 4. Mood: not on any meds             -antipsychotic agents: n/a 5. Neuropsych: This patient is somewhat capable of making decisions on his own behalf. 6. Skin/Wound Care: no open skin areas- however has an area of irritation near anus- Stage I but TINY- will monitor  7/1: Added Eucerin cream for dry feet. 7. Fluids/Electrolytes/Nutrition: regular diet with thin liquids- need SLP for dysarthria/dysphagia and cognition 8.  Neurogenic bowel and bladder.  Suppository nightly as needed, MiraLAX daily.  Check PVR's  6/23- PVRs ordered, but don't see recorded in chart- will order again, since need to know if pt retaining- having many incontinent stools- will d/w pt/parents about putting on bowel program.   6/24- d/w pt and father about bowel  program - 2nd time saw pt- discussed at length. They will think about it.  6/25- will start bowel program per pt request.   6/26- went well last night- con't regimen  6/27- responding well to bowel program; needing occ I/o caths as well per nursing  6/29- needs to do bowel program- refused las tnight since thought getting control- explained if can go in bedpan, that's control- if not, he's just having accidents.   6/30- d/w pt- he's willing again to try bowel program 9.  Seizure disorder.  Keppra 1000 mg twice daily, Inderal 10 mg twice daily. Seizure precautions.   6/30- pt had episode of lethargy/sedation- couldn't wake him up, even with sternal rub, was documented- will consult Neurology- due to hx of seizures- and also order labs- to make sure no infection- recent hx of menigitis  7/1: Discussed with patient that EEG and labs were stable yesterday, with the exception of leukocytosis. Repeat CBC tomorrow to trend.  10.  Hyponatremia.  Continue sodium chloride tablets 3 times daily.  Follow-up chemistries- last Na 136 per father  6/23- Na 136- con't salt tabs 11. Neurofibromatosis-2- was getting steroids- per Dr Prince Rome can reduce by 20% every few days;  -will also try and contact Dr Shaaron Adler about possible additional chemotherapy- Dr Prince Rome was CLEAR can use estim, since not cancerous dx.   6/23- will wean Decadron to 4 mg q12 hours from q8 hours for 2 days, then con't to wean.  6/25- decrease steroids due to 2mg  q12 hours today   6/27- will decrease to 2 mg daily. In 3 days, will stop, if tolerating well, esp since got thrush from steroids.   6/30- can stop Decadron as of 7/2- if things going well-  12. Impaired function             -will order grey call bell for pt.  13. Hyperkalemia  6/23- K+ 5.7- hemolyzed- will recheck in AM  6/24- K= 4.3 14. Thrush  6/26- add diflucan 200 mg x1 then 100 mg daily x 6 days then nystatin S&S QID x 5 days.  6/29- thrush gone from oropharynx  15. Dysphagia:  continue dysphagia 3 diet and SLP treatment.  6/29- needs to get meds crushed in pudding/applesauce     LOS: 9 days A FACE TO [place] 05/18/2020, 9:16 AM

## 2020-05-19 ENCOUNTER — Inpatient Hospital Stay (HOSPITAL_COMMUNITY): Payer: Medicare Other | Admitting: Speech Pathology

## 2020-05-19 ENCOUNTER — Inpatient Hospital Stay (HOSPITAL_COMMUNITY): Payer: Medicare Other | Admitting: Physical Therapy

## 2020-05-19 ENCOUNTER — Inpatient Hospital Stay (HOSPITAL_COMMUNITY): Payer: Medicare Other | Admitting: Occupational Therapy

## 2020-05-19 LAB — CBC WITH DIFFERENTIAL/PLATELET
Abs Immature Granulocytes: 0.31 10*3/uL — ABNORMAL HIGH (ref 0.00–0.07)
Basophils Absolute: 0 10*3/uL (ref 0.0–0.1)
Basophils Relative: 0 %
Eosinophils Absolute: 0.1 10*3/uL (ref 0.0–0.5)
Eosinophils Relative: 1 %
HCT: 35.8 % — ABNORMAL LOW (ref 39.0–52.0)
Hemoglobin: 11.6 g/dL — ABNORMAL LOW (ref 13.0–17.0)
Immature Granulocytes: 3 %
Lymphocytes Relative: 21 %
Lymphs Abs: 1.9 10*3/uL (ref 0.7–4.0)
MCH: 27.6 pg (ref 26.0–34.0)
MCHC: 32.4 g/dL (ref 30.0–36.0)
MCV: 85.2 fL (ref 80.0–100.0)
Monocytes Absolute: 0.9 10*3/uL (ref 0.1–1.0)
Monocytes Relative: 10 %
Neutro Abs: 5.9 10*3/uL (ref 1.7–7.7)
Neutrophils Relative %: 65 %
Platelets: 202 10*3/uL (ref 150–400)
RBC: 4.2 MIL/uL — ABNORMAL LOW (ref 4.22–5.81)
RDW: 15.7 % — ABNORMAL HIGH (ref 11.5–15.5)
WBC: 9.2 10*3/uL (ref 4.0–10.5)
nRBC: 0 % (ref 0.0–0.2)

## 2020-05-19 NOTE — Progress Notes (Signed)
Speech Language Pathology Daily Session Note  Patient Details  Name: Cameron Hale [name]: [id number] Date of Birth: 1975/12/27  Today's Date: 05/19/2020 SLP Individual Time: 1405-1500 SLP Individual Time Calculation (min): 55 min  Short Term Goals: Week 2: SLP Short Term Goal 1 (Week 2): Patient will consume current diet with minimal overt s/s of aspiration with Mod I. SLP Short Term Goal 2 (Week 2): Patient will demonstrate efficient mastication with trials of regular textures with complete oral clearance without overt s/s of aspiration over 2 sessions prior to upgrade with supervision verbal cues. SLP Short Term Goal 3 (Week 2): Patient will utilize diaphramatic breathing at the phrase level with Mod A verbal cues to achieve ~75% intelligibility. SLP Short Term Goal 4 (Week 2): Patient will perform 25 repetitions of EMST at 7 cm H2O with Max A multimodal cues with a self-perceived effort level of 7/10 without reports of dizziness, etc. SLP Short Term Goal 5 (Week 2): Patient will perform 25 repetitions of IMST at 13 cm H2O with Max A multimodal cues with a self-perceived effort level of 7/10 without reports of dizziness, etc.  Skilled Therapeutic Interventions:  Pt was seen for skilled ST targeting goals for speech intelligibility.  Pt was able to complete 40 repetitions of IMST at 13 cm H2O with mod assist multimodal cues and a self perceived effort level of 6 out of 10.  Pt completed 25 repetitions of EMST at 7 cm H2O with a self perceived effort level of 7 out of 10.  No adverse effects reported by pt or observed by therapist.  Subjectively pt's voice is louder in comparison to previous ST session with this therapist.  Pt and family (wife and mom) both report noticing a change in vocal intensity over the past week.  Pt was able to maintain intelligibility at the phrase level with min-mod assist verbal cues.  Pt was transferred from wheelchair back to bed via [facility] and +2 assist and left with  family at bedside and call bell within reach.  Continue per current plan of care.    Pain Pain Assessment Pain Scale: 0-10 Pain Score: 0-No pain  Therapy/Group: Individual Therapy  [name], Selinda Orion 05/19/2020, 3:04 PM

## 2020-05-19 NOTE — Progress Notes (Signed)
Physical Therapy Session Note  Patient Details  Name: Cameron Hale [name]: [id number] Date of Birth: 01-03-1976  Today's Date: 05/19/2020 PT Individual Time: 1045-1200 PT Individual Time Calculation (min): 75 min   Short Term Goals: Week 2:  PT Short Term Goal 1 (Week 2): Pt will perform bed mobility with max A x1 consistently. PT Short Term Goal 2 (Week 2): Pt will maintain static sitting balance x10 minutes with mod A. PT Short Term Goal 3 (Week 2): Pt will drive 103 feet using power wheelchair with close supervision.  Skilled Therapeutic Interventions/Progress Updates:  Pt received seated in Lane bed in room. Agreeable to therapy. Denies any pain. B/L AFOs dependently donned at bed level by therapist. Performed tilting of Kreg Lift bed to promote weightbearing through LEs:  Supine (0 degrees) BP 117/82 with HR 65  20 degrees BP 108/84 with HR 70  30 degrees BP 108/84 with HR 71  40 degrees BP 104/82 with HR 73.  Pt denies any lightheadedness or dizziness throughout Performed AAROM of BUEs with bed tilted to 40 degrees: elbow flexion 1x10, shoulder flexion 1x10, and shoulder abduction 1x10. Repeat BP with bed tilted to 40 degrees is 108/91 with a HR of 74. Pt tolerated approximately 20 minutes with bed tilted to 40 degrees. Bed was then returned to 0 degrees. Supine to sit with A x2 with total A x1 and mod A x1. Slide board transfer from EOB to TIS W/C with A x2 with total A x1 and mod A x1. With pt seated in W/C, placed pillows to prevent excessive ER/abduction of BLEs. Discussed with pt the benefits of power W/C to promote independence at home, and pt is agreeable to W/C evaluation. Performed stretching of BLEs in available planes of motion 2 x 1 minute each side. Seated in W/C, pt voided 150 cc's of urine using urinal. Pt dependent for clothing management. Pt left seated in W/C with all needs in reach. All questions answered.   Therapy Documentation Precautions:   Precautions Precautions: Fall Precaution Comments: seizure precautions Restrictions Weight Bearing Restrictions: No  Therapy/Group: Individual Therapy  Mazi Brailsford, SPT  05/19/2020, 4:01 PM

## 2020-05-19 NOTE — Progress Notes (Signed)
[facility] PHYSICAL MEDICINE & REHABILITATION PROGRESS NOTE   Subjective/Complaints: No complaints this morning. Having shower with OT.  Hgb stable  ROS:  Pt denies SOB, abd pain, CP, N/V/C/D, and vision changes    Objective:   No results found. Recent Labs    05/17/20 1450 05/19/20 0603  WBC 13.2* 9.2  HGB 11.9* 11.6*  HCT 36.8* 35.8*  PLT 220 202   Recent Labs    05/17/20 1450  NA 137  K 4.5  CL 102  CO2 25  GLUCOSE 116*  BUN 15  CREATININE 0.34*  CALCIUM 9.1    Intake/Output Summary (Last 24 hours) at 05/19/2020 1612 Last data filed at 05/19/2020 1155 Gross per 24 hour  Intake 716 ml  Output 600 ml  Net 116 ml     Physical Exam: Vital Signs Blood pressure (!) 145/105, pulse 69, temperature (!) 95.1 F (35.1 C), temperature source Rectal, resp. rate 16, height (P) 5\' 10"  (1.778 m), weight (P) 61.5 kg, SpO2 100 %. General: Alert and oriented x 3, No apparent distress HEENT: head scars from previous incisions and neck incision looks good-thrush gone  Neck: Supple without JVD or lymphadenopathy Heart: borderline bradycardia- regular rhythm Chest: CTA bilaterally without wheezes, rales, or rhonchi; no distress Abdomen: Soft, non-tender, non-distended, bowel sounds positive. Extremities: No clubbing, cyanosis, or edema. Pulses are 2+ Musculoskeletal:     Cervical back: Normal range of motion and neck supple. No rigidity.     Comments: Full strength exam done RUE- biceps 4-/5, WE 2-/5, triceps 1/5, grip 2-/5, finger abd 1/5 LUE- biceps 3/5, WE 2-/5, triceps 2-/5, grip 1/5, finger abd 1/5 RLE- HF, KE, DF, PF all 1/5 LLE- HF 2-/5; otherwise 0/5 in LLE  Neurological:Ox2 Hoffman's in RUE- not LUE No clonus; MAS of 1 in UEs and 1+ in LEs Sensation to light touch impaired from C5 downwards- gets less notable as goes down torso/legs. - Skin: Skin is warm and dry.  Tiny, tiny area that's pink around anus- blanchable- skin tear- not open- not assessed since up in  w/c.  Psychiatric: normal mood and affect  Assessment/Plan: 1. 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  Physiatrist is providing close team supervision and 24 hour management of active medical problems listed below.  Physiatrist and rehab team continue to assess barriers to discharge/monitor patient progress toward functional and medical goals  Care Tool:  Bathing  Bathing activity did not occur: Safety/medical concerns Body parts bathed by patient: Chest, Abdomen, Front perineal area   Body parts bathed by helper: Right arm, Left arm, Chest, Abdomen, Front perineal area, Buttocks, Right upper leg, Left upper leg, Right lower leg, Left lower leg, Face Body parts n/a: Right arm, Left arm, Buttocks, Right upper leg, Left upper leg, Right lower leg, Left lower leg, Face   Bathing assist Assist Level: Total Assistance - Patient < 25%     Upper Body Dressing/Undressing Upper body dressing   What is the patient wearing?: Pull over shirt    Upper body assist Assist Level: Total Assistance - Patient < 25%    Lower Body Dressing/Undressing Lower body dressing      What is the patient wearing?: Incontinence brief, Pants     Lower body assist Assist for lower body dressing: Total Assistance - Patient < 25%     Toileting Toileting Toileting Activity did not occur Landscape architect and  hygiene only): N/A (no void or bm)  Toileting assist Assist for toileting: 2 Helpers     Transfers Chair/bed transfer  Transfers assist     Chair/bed transfer assist level: 2 Helpers     Locomotion Ambulation   Ambulation assist   Ambulation activity did not occur: Safety/medical concerns          Walk 10 feet activity   Assist  Walk 10 feet activity did not occur: Safety/medical concerns        Walk 50 feet  activity   Assist Walk 50 feet with 2 turns activity did not occur: Safety/medical concerns         Walk 150 feet activity   Assist Walk 150 feet activity did not occur: Safety/medical concerns         Walk 10 feet on uneven surface  activity   Assist Walk 10 feet on uneven surfaces activity did not occur: Safety/medical concerns         Wheelchair     Assist Will patient use wheelchair at discharge?: Yes Type of Wheelchair: Power Wheelchair activity did not occur: Safety/medical concerns  Wheelchair assist level: Minimal Assistance - Patient > 75% Max wheelchair distance: 150'    Wheelchair 50 feet with 2 turns activity    Assist    Wheelchair 50 feet with 2 turns activity did not occur: Safety/medical concerns   Assist Level: Minimal Assistance - Patient > 75%   Wheelchair 150 feet activity     Assist  Wheelchair 150 feet activity did not occur: Safety/medical concerns   Assist Level: Minimal Assistance - Patient > 75%   Blood pressure (!) 145/105, pulse 69, temperature (!) 95.1 F (35.1 C), temperature source Rectal, resp. rate 16, height (P) 5\' 10"  (1.778 m), weight (P) 61.5 kg, SpO2 100 %.  Medical Problem List and Plan: 1.  Decreased functional mobility/C4 ASIA B/C myelopathy with neurogenic bowel and bladder and mild spasticity secondary to neurofibromatosis type II with multiple prior surgeries for meningioma neurofibroma resection and most recent resection of dural based spinal tumor 3/55/7322 complicated by CSF leak wound revision.  Antibiotic therapy completed 04/28/2020- parents say 6/14- PICC out             -patient may  shower             -ELOS/Goals: goals min-mod A- 3-4 weeks  -Continue CIR 2.  Antithrombotics: -DVT/anticoagulation: Subcutaneous heparin.  Check vascular study 6/23- (-) for DVTs 6/26- change to Lovenox since no renal issues and not allergic             -antiplatelet therapy: n/a 3. Pain Management: Baclofen 10  mg twice daily for spasticity. Well controlled. 4. Mood: not on any meds             -antipsychotic agents: n/a 5. Neuropsych: This patient is somewhat capable of making decisions on his own behalf. 6. Skin/Wound Care: no open skin areas- however has an area of irritation near anus- Stage I but TINY- will monitor  7/1: Added Eucerin cream for dry feet. 7. Fluids/Electrolytes/Nutrition: regular diet with thin liquids- need SLP for dysarthria/dysphagia and cognition 8.  Neurogenic bowel and bladder.  Suppository nightly as needed, MiraLAX daily.  Check PVR's  6/23- PVRs ordered, but don't see recorded in chart- will order again, since need to know if pt retaining- having many incontinent stools- will d/w pt/parents about putting on bowel program.   6/24- d/w pt and father about bowel program - 2nd time  saw pt- discussed at length. They will think about it.  6/25- will start bowel program per pt request.   6/26- went well last night- con't regimen  6/27- responding well to bowel program; needing occ I/o caths as well per nursing  6/29- needs to do bowel program- refused las tnight since thought getting control- explained if can go in bedpan, that's control- if not, he's just having accidents.   6/30- d/w pt- he's willing again to try bowel program 9.  Seizure disorder.  Keppra 1000 mg twice daily, Inderal 10 mg twice daily. Seizure precautions.   6/30- pt had episode of lethargy/sedation- couldn't wake him up, even with sternal rub, was documented- will consult Neurology- due to hx of seizures- and also order labs- to make sure no infection- recent hx of menigitis  7/1: Discussed with patient that EEG and labs were stable yesterday, with the exception of leukocytosis. Repeat CBC tomorrow to trend.  10.  Hyponatremia.  Continue sodium chloride tablets 3 times daily.  Follow-up chemistries- last Na 136 per father  6/23- Na 136- con't salt tabs 11. Neurofibromatosis-2- was getting steroids- per Dr  Prince Rome can reduce by 20% every few days;  -will also try and contact Dr Shaaron Adler about possible additional chemotherapy- Dr Prince Rome was CLEAR can use estim, since not cancerous dx.   6/23- will wean Decadron to 4 mg q12 hours from q8 hours for 2 days, then con't to wean.  6/25- decrease steroids due to 2mg  q12 hours today   6/27- will decrease to 2 mg daily. In 3 days, will stop, if tolerating well, esp since got thrush from steroids.   7/2: discontinued Decadron- course completed 12. Impaired function             -will order grey call bell for pt.  13. Hyperkalemia  6/23- K+ 5.7- hemolyzed- will recheck in AM  6/24- K= 4.3 14. Thrush  6/26- add diflucan 200 mg x1 then 100 mg daily x 6 days then nystatin S&S QID x 5 days.  6/29- thrush gone from oropharynx  15. Dysphagia: continue dysphagia 3 diet and SLP treatment.  6/29- needs to get meds crushed in pudding/applesauce  16. ABLA: Hgb stable 7/2 17. Leukocytosis: WBC stable on 7/2    LOS: 10 days A FACE TO FACE EVALUATION WAS PERFORMED  Cameron Hale Adaly Puder 05/19/2020, 4:12 PM

## 2020-05-19 NOTE — Progress Notes (Signed)
Occupational Therapy Session Note  Patient Details  Name: Cameron Hale [name]: [id number] Date of Birth: [birth year]  Today's Date: 05/19/2020 OT Individual Time: 0177-9390 OT Individual Time Calculation (min): 75 min    Short Term Goals: Week 2:  OT Short Term Goal 1 (Week 2): Pt will perform UB self care with max A. OT Short Term Goal 2 (Week 2): Pt will maintain static sitting balance with min A on EOB for 3 minutes. OT Short Term Goal 3 (Week 2): Pt will utilized AE and modifications as needed for self feeding with mod A.  Skilled Therapeutic Interventions/Progress Updates:    OT intervention with focus on bathing at shower level and dressing seated in bed and rolling R/L to facilitate pulling pants over hips.  Pt transferred to rolling shower chair with MaxiMove. Bath Mitt donned on pt's LUE. Pt able to bathe chest, abdomen, and front perineal area. Pt returned to bed for dressing tasks  Pt initiates threading BUE into shirt sleeves while seated but requires assistance to complete tasks.  Rolling R<>L in bed with max A+2. Tot A for donning pants at bed level.  Bed placed in chair position with lap belt secured and pillows to support BUE. Pt remained in chair postion.  All needs within reach.   Therapy Documentation Precautions:  Precautions Precautions: Fall Precaution Comments: seizure precautions Restrictions Weight Bearing Restrictions: No   Pain:  Pt denies pain but c/o discomfort sitting on rolling shower chair; repositioned   Therapy/Group: Individual Therapy  Leroy Libman 05/19/2020, 9:52 AM

## 2020-05-19 NOTE — Progress Notes (Signed)
Bowel program completed, awaiting bowel movement but patient did have bowel movement today. Patient family wants to make sure he is completing bowel program each night. No further complications noted at this time. Audie Clear, LPN

## 2020-05-20 ENCOUNTER — Inpatient Hospital Stay (HOSPITAL_COMMUNITY): Payer: Medicare Other

## 2020-05-20 MED ORDER — LEVETIRACETAM 750 MG PO TABS
1500.0000 mg | ORAL_TABLET | Freq: Two times a day (BID) | ORAL | Status: DC
  Administered 2020-05-20: 1500 mg via ORAL
  Filled 2020-05-20 (×2): qty 2

## 2020-05-20 NOTE — Progress Notes (Signed)
Patient was incontinent of bowel following dinner, large volume stool. Dig stim did not produce any more, so suppository placed. Patient tolerated well.  Foam dressing placed over left elbow and lateral arm to protect against pressure injury. Red spot noted, blanchable.

## 2020-05-20 NOTE — Progress Notes (Addendum)
[facility] PHYSICAL MEDICINE & REHABILITATION PROGRESS NOTE   Subjective/Complaints:  No issues overnite, bowels are moving well   ROS:  Pt denies SOB, abd pain, CP, N/V/C/D, and vision changes    Objective:   No results found. Recent Labs    05/17/20 1450 05/19/20 0603  WBC 13.2* 9.2  HGB 11.9* 11.6*  HCT 36.8* 35.8*  PLT 220 202   Recent Labs    05/17/20 1450  NA 137  K 4.5  CL 102  CO2 25  GLUCOSE 116*  BUN 15  CREATININE 0.34*  CALCIUM 9.1    Intake/Output Summary (Last 24 hours) at 05/20/2020 1045 Last data filed at 05/20/2020 0857 Gross per 24 hour  Intake 582 ml  Output 500 ml  Net 82 ml     Physical Exam: Vital Signs Blood pressure (!) 132/94, pulse 80, temperature (!) 97.5 F (36.4 C), temperature source Oral, resp. rate 18, height (P) 5\' 10"  (1.778 m), weight (P) 61.5 kg, SpO2 94 %.  General: No acute distress Mood and affect are appropriate Heart: Regular rate and rhythm no rubs murmurs or extra sounds Lungs: Clear to auscultation, breathing unlabored, no rales or wheezes Abdomen: Positive bowel sounds, soft nontender to palpation, nondistended Extremities: No clubbing, cyanosis, or edema   RUE- biceps 4-/5, WE 2-/5, triceps 1/5, grip 2-/5, finger abd 1/5 LUE- biceps 3/5, WE 2-/5, triceps 2-/5, grip 1/5, finger abd 1/5 RLE- HF, KE, DF, PF all 1/5 LLE- HF 2-/5; otherwise 0/5 in LLE  Neurological:Ox2  Skin: Skin is warm and dry.   Psychiatric: normal mood and affect  Assessment/Plan: 1. 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  Physiatrist is providing close team supervision and 24 hour management of active medical problems listed below.  Physiatrist and rehab team continue to assess barriers to discharge/monitor patient progress toward functional and medical goals  Care Tool:  Bathing   Bathing activity did not occur: Safety/medical concerns Body parts bathed by patient: Chest, Abdomen, Front perineal area   Body parts bathed by helper: Right arm, Left arm, Chest, Abdomen, Front perineal area, Buttocks, Right upper leg, Left upper leg, Right lower leg, Left lower leg, Face Body parts n/a: Right arm, Left arm, Buttocks, Right upper leg, Left upper leg, Right lower leg, Left lower leg, Face   Bathing assist Assist Level: Total Assistance - Patient < 25%     Upper Body Dressing/Undressing Upper body dressing   What is the patient wearing?: Pull over shirt    Upper body assist Assist Level: Total Assistance - Patient < 25%    Lower Body Dressing/Undressing Lower body dressing      What is the patient wearing?: Incontinence brief, Pants     Lower body assist Assist for lower body dressing: Total Assistance - Patient < 25%     Toileting Toileting Toileting Activity did not occur Landscape architect and hygiene only): N/A (no void or bm)  Toileting assist Assist for toileting: 2 Helpers     Transfers Chair/bed transfer  Transfers assist     Chair/bed transfer assist level: 2 Helpers     Locomotion Ambulation   Ambulation assist   Ambulation activity did not occur: Safety/medical concerns          Walk 10 feet activity   Assist  Walk 10 feet activity did not occur: Safety/medical concerns  Walk 50 feet activity   Assist Walk 50 feet with 2 turns activity did not occur: Safety/medical concerns         Walk 150 feet activity   Assist Walk 150 feet activity did not occur: Safety/medical concerns         Walk 10 feet on uneven surface  activity   Assist Walk 10 feet on uneven surfaces activity did not occur: Safety/medical concerns         Wheelchair     Assist Will patient use wheelchair at discharge?: Yes Type of Wheelchair: Power Wheelchair activity did not occur: Safety/medical concerns  Wheelchair  assist level: Minimal Assistance - Patient > 75% Max wheelchair distance: 150'    Wheelchair 50 feet with 2 turns activity    Assist    Wheelchair 50 feet with 2 turns activity did not occur: Safety/medical concerns   Assist Level: Minimal Assistance - Patient > 75%   Wheelchair 150 feet activity     Assist  Wheelchair 150 feet activity did not occur: Safety/medical concerns   Assist Level: Minimal Assistance - Patient > 75%   Blood pressure (!) 132/94, pulse 80, temperature (!) 97.5 F (36.4 C), temperature source Oral, resp. rate 18, height (P) 5\' 10"  (1.778 m), weight (P) 61.5 kg, SpO2 94 %.  Medical Problem List and Plan: 1.  Decreased functional mobility/C4 ASIA B/C myelopathy with neurogenic bowel and bladder and mild spasticity secondary to neurofibromatosis type II with multiple prior surgeries for meningioma neurofibroma resection and most recent resection of dural based spinal tumor 6/78/9381 complicated by CSF leak wound revision.  Antibiotic therapy completed 04/28/2020- parents say 6/14- PICC out             -patient may  shower             -ELOS/Goals: goals min-mod A- 3-4 weeks  -Continue CIR 2.  Antithrombotics: -DVT/anticoagulation: Subcutaneous heparin.  Check vascular study 6/23- (-) for DVTs 6/26- change to Lovenox since no renal issues and not allergic             -antiplatelet therapy: n/a 3. Pain Management: Baclofen 10 mg twice daily for spasticity. Well controlled. 4. Mood: not on any meds             -antipsychotic agents: n/a 5. Neuropsych: This patient is somewhat capable of making decisions on his own behalf. 6. Skin/Wound Care: no open skin areas- however has an area of irritation near anus- Stage I but TINY- will monitor  7/1: Added Eucerin cream for dry feet. 7. Fluids/Electrolytes/Nutrition: regular diet with thin liquids- need SLP for dysarthria/dysphagia and cognition 8.  Neurogenic bowel and bladder.  Suppository nightly as needed,  MiraLAX daily.  Check PVR's  6/23- PVRs ordered, but don't see recorded in chart- will order again, since need to know if pt retaining- having many incontinent stools- will d/w pt/parents about putting on bowel program.   6/24- d/w pt and father about bowel program - 2nd time saw pt- discussed at length. They will think about it.  6/25- will start bowel program per pt request.   6/26- went well last night- con't regimen  6/27- responding well to bowel program; needing occ I/o caths as well per nursing  6/29- needs to do bowel program- refused las tnight since thought getting control- explained if can go in bedpan, that's control- if not, he's just having accidents.   6/30- d/w pt- he's willing again to try bowel program 7/3 BM this am  type 6 9.  Seizure disorder.  Keppra 1000 mg twice daily, Inderal 10 mg twice daily. Seizure precautions.   6/30- pt had episode of lethargy/sedation- couldn't wake him up, even with sternal rub, was documented- will consult Neurology- due to hx of seizures- and also order labs- to make sure no infection- recent hx of menigitis  7/1: Discussed with patient that EEG and labs were stable yesterday, with the exception of leukocytosis. Repeat CBC tomorrow to trend.  10.  Hyponatremia.  Continue sodium chloride tablets 3 times daily.  Follow-up chemistries- last Na 136 per father  6/23- Na 136- con't salt tabs 11. Neurofibromatosis-2- was getting steroids- per Dr Prince Rome can reduce by 20% every few days;  -will also try and contact Dr Shaaron Adler about possible additional chemotherapy- Dr Prince Rome was CLEAR can use estim, since not cancerous dx.   6/23- will wean Decadron to 4 mg q12 hours from q8 hours for 2 days, then con't to wean.  6/25- decrease steroids due to 2mg  q12 hours today   6/27- will decrease to 2 mg daily. In 3 days, will stop, if tolerating well, esp since got thrush from steroids.   7/2: discontinued Decadron- course completed 12. Impaired function              -will order grey call bell for pt.  13. Hyperkalemia  6/23- K+ 5.7- hemolyzed- will recheck in AM  6/24- K= 4.3 14. Thrush  6/26- add diflucan 200 mg x1 then 100 mg daily x 6 days then nystatin S&S QID x 5 days.  6/29- thrush gone from oropharynx  15. Dysphagia: continue dysphagia 3 diet and SLP treatment.  6/29- needs to get meds crushed in pudding/applesauce  16. ABLA: Hgb stable 7/2 17. Leukocytosis: WBC stable on 7/2    LOS: 11 days A FACE TO FACE EVALUATION WAS PERFORMED  Charlett Blake 05/20/2020, 10:45 AM

## 2020-05-21 MED ORDER — LEVETIRACETAM 100 MG/ML PO SOLN
1500.0000 mg | Freq: Two times a day (BID) | ORAL | Status: DC
  Administered 2020-05-21 – 2020-06-05 (×30): 1500 mg via ORAL
  Filled 2020-05-21 (×32): qty 15

## 2020-05-21 NOTE — Progress Notes (Signed)
[place] PHYSICAL MEDICINE & REHABILITATION PROGRESS NOTE   Subjective/Complaints: Eating 75-100% meals   No new issues per RN  ROS:  Pt denies SOB, CP N/V/D    Objective:   No results found. Recent Labs    05/19/20 0603  WBC 9.2  HGB 11.6*  HCT 35.8*  PLT 202   No results for input(s): NA, K, CL, CO2, GLUCOSE, BUN, CREATININE, CALCIUM in the last 72 hours.  Intake/Output Summary (Last 24 hours) at 05/21/2020 0915 Last data filed at 05/21/2020 0842 Gross per 24 hour  Intake 477 ml  Output 275 ml  Net 202 ml     Physical Exam: Vital Signs Blood pressure (!) 141/97, pulse 78, temperature 97.7 F (36.5 C), temperature source Oral, resp. rate 18, height (P) 5\' 10"  (1.778 m), weight (P) 61.5 kg, SpO2 99 %.  General: No acute distress Mood and affect are appropriate Heart: Regular rate and rhythm no rubs murmurs or extra sounds Lungs: Clear to auscultation, breathing unlabored, no rales or wheezes Abdomen: Positive bowel sounds, soft nontender to palpation, nondistended Extremities: No clubbing, cyanosis, or edema Skin: No evidence of breakdown, no evidence of rash   RUE- biceps 4-/5, WE 2-/5, triceps 1/5, grip 2-/5, finger abd 1/5 LUE- biceps 3/5, WE 2-/5, triceps 2-/5, grip 1/5, finger abd 1/5 RLE- HF, KE, DF, PF all 1/5 LLE- HF 2-/5; otherwise 0/5 in LLE  Neurological:Ox2  Skin: Skin is warm and dry.   Psychiatric: normal mood and affect  Assessment/Plan: 1. 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  Physiatrist is providing close team supervision and 24 hour management of active medical problems listed below.  Physiatrist and rehab team continue to assess barriers to discharge/monitor patient progress toward functional and medical goals  Care Tool:  Bathing  Bathing activity did not occur: Safety/medical  concerns Body parts bathed by patient: Chest, Abdomen, Front perineal area   Body parts bathed by helper: Right arm, Left arm, Chest, Abdomen, Front perineal area, Buttocks, Right upper leg, Left upper leg, Right lower leg, Left lower leg, Face Body parts n/a: Right arm, Left arm, Buttocks, Right upper leg, Left upper leg, Right lower leg, Left lower leg, Face   Bathing assist Assist Level: Total Assistance - Patient < 25%     Upper Body Dressing/Undressing Upper body dressing   What is the patient wearing?: Pull over shirt    Upper body assist Assist Level: Total Assistance - Patient < 25%    Lower Body Dressing/Undressing Lower body dressing      What is the patient wearing?: Incontinence brief, Pants     Lower body assist Assist for lower body dressing: Total Assistance - Patient < 25%     Toileting Toileting Toileting Activity did not occur Landscape architect and hygiene only): N/A (no void or bm)  Toileting assist Assist for toileting: 2 Helpers     Transfers Chair/bed transfer  Transfers assist     Chair/bed transfer assist level: 2 Helpers     Locomotion Ambulation   Ambulation assist   Ambulation activity did not occur: Safety/medical concerns          Walk 10 feet activity   Assist  Walk 10 feet activity did not occur: Safety/medical concerns        Walk 50 feet activity   Assist Walk 50 feet with 2 turns activity did not occur: Safety/medical  concerns         Walk 150 feet activity   Assist Walk 150 feet activity did not occur: Safety/medical concerns         Walk 10 feet on uneven surface  activity   Assist Walk 10 feet on uneven surfaces activity did not occur: Safety/medical concerns         Wheelchair     Assist Will patient use wheelchair at discharge?: Yes Type of Wheelchair: Power Wheelchair activity did not occur: Safety/medical concerns  Wheelchair assist level: Minimal Assistance - Patient >  75% Max wheelchair distance: 150'    Wheelchair 50 feet with 2 turns activity    Assist    Wheelchair 50 feet with 2 turns activity did not occur: Safety/medical concerns   Assist Level: Minimal Assistance - Patient > 75%   Wheelchair 150 feet activity     Assist  Wheelchair 150 feet activity did not occur: Safety/medical concerns   Assist Level: Minimal Assistance - Patient > 75%   Blood pressure (!) 141/97, pulse 78, temperature 97.7 F (36.5 C), temperature source Oral, resp. rate 18, height (P) 5\' 10"  (1.778 m), weight (P) 61.5 kg, SpO2 99 %.  Medical Problem List and Plan: 1.  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  Antibiotic therapy completed 04/28/2020- parents say 6/14- PICC out             -patient may  shower             -ELOS/Goals: goals min-mod A- 3-4 weeks  -Continue CIR 2.  Antithrombotics: -DVT/anticoagulation: Subcutaneous heparin.  Check vascular study 6/23- (-) for DVTs 6/26- change to Lovenox since no renal issues and not allergic             -antiplatelet therapy: n/a 3. Pain Management: Baclofen 10 mg twice daily for spasticity. Well controlled. 4. Mood: not on any meds             -antipsychotic agents: n/a 5. Neuropsych: This patient is somewhat capable of making decisions on his own behalf. 6. Skin/Wound Care: no open skin areas- however has an area of irritation near anus- Stage I but TINY- will monitor  7/1: Added Eucerin cream for dry feet. 7. Fluids/Electrolytes/Nutrition: regular diet with thin liquids- need SLP for dysarthria/dysphagia and cognition 8.  Neurogenic bowel and bladder.  Suppository nightly as needed, MiraLAX daily.  Check PVR's  6/23- PVRs ordered, but  don't see recorded in chart- will order again, since need to know if pt retaining- having many incontinent stools- will d/w pt/parents about putting on bowel program.   6/24- d/w pt and father about bowel program - 2nd time saw pt- discussed at length. They will think about it.  6/25- will start bowel program per pt request.   6/26- went well last night- con't regimen  6/27- responding well to bowel program; needing occ I/o caths as well per nursing  6/29- needs to do bowel program- refused las tnight since thought getting control- explained if can go in bedpan, that's control- if not, he's just having accidents.   6/30- d/w pt- he's willing again to try bowel program 7/3 BM this am type 6 9.  Seizure disorder.  Keppra 1000 mg twice daily, Inderal 10 mg twice daily. Seizure precautions.  6/30- pt had episode of lethargy/sedation- couldn't wake him up, even with sternal rub, was documented- will consult Neurology- due to hx of seizures- and also order labs- to make sure no infection- recent hx of menigitis  7/1: Discussed with patient that EEG and labs were stable yesterday, with the exception of leukocytosis. Repeat CBC tomorrow to trend.  10.  Hyponatremia.  Continue sodium chloride tablets 3 times daily.  Follow-up chemistries- last Na 136 per father  6/23- Na 136- con't salt tabs 11. Neurofibromatosis-2- was getting steroids- per Dr Prince Rome can reduce by 20% every few days;  -will also try and contact Dr Shaaron Adler about possible additional chemotherapy- Dr Prince Rome was CLEAR can use estim, since not cancerous dx.   6/23- will wean Decadron to 4 mg q12 hours from q8 hours for 2 days, then con't to wean.  6/25- decrease steroids due to 2mg  q12 hours today   6/27- will decrease to 2 mg daily. In 3 days, will stop, if tolerating well, esp since got thrush from steroids.   7/2: discontinued Decadron- course completed 12. Impaired function             -will order grey call bell for pt.  13.  Hyperkalemia  6/23- K+ 5.7- hemolyzed- will recheck in AM  6/24- K= 4.3 14. Thrush  6/26- add diflucan 200 mg x1 then 100 mg daily x 6 days then nystatin S&S QID x 5 days.  6/29- thrush gone from oropharynx - off steroids which should prevent recurrence  15. Dysphagia: continue dysphagia 3 diet and SLP treatment.  6/29- needs to get meds crushed in pudding/applesauce  16. ABLA: Hgb stable 7/2 17. Leukocytosis: WBC stable on 7/2    LOS: 12 days A FACE TO FACE EVALUATION WAS PERFORMED  Charlett Blake 05/21/2020, 9:15 AM

## 2020-05-21 NOTE — Progress Notes (Signed)
   05/21/20 1353  Assess: MEWS Score  Temp (!) 94.2 F (34.6 C)  BP (!) 117/102  Pulse Rate 65  Resp 18  O2 Device Room Air  Assess: MEWS Score  MEWS Temp 2  MEWS Systolic 0  MEWS Pulse 0  MEWS RR 0  MEWS LOC 0  MEWS Score 2  MEWS Score Color Yellow  Assess: if the MEWS score is Yellow or Red  Were vital signs taken at a resting state? Yes  Focused Assessment Documented focused assessment  Early Detection of Sepsis Score *See Row Information* Low  MEWS guidelines implemented *See Row Information* Yes  Treat  MEWS Interventions Escalated (See documentation below)  Take Vital Signs  Increase Vital Sign Frequency  Yellow: Q 2hr X 2 then Q 4hr X 2, if remains yellow, continue Q 4hrs  Escalate  MEWS: Escalate Yellow: discuss with charge nurse/RN and consider discussing with provider and RRT  Notify: Charge Nurse/RN  Name of Charge Nurse/RN Notified Dwaine Gale, RN  Date Charge Nurse/RN Notified 05/21/20  Time Charge Nurse/RN Notified 1419  Notify: Provider  Provider Name/Title Dr. Letta Pate  Date Provider Notified 05/21/20  Time Provider Notified 1419  Notification Type Call  Notification Reason Other (Comment) (Puzzling temp)  Response No new orders (watch and call if condition changes)  Date of Provider Response 05/21/20  Time of Provider Response 1419

## 2020-05-21 NOTE — Progress Notes (Signed)
Bowel program performed. Patient had been incontinent in brief prior to program. With dig stim, small additional amount was removed. Suppository then placed. Tolerated well.

## 2020-05-22 ENCOUNTER — Inpatient Hospital Stay (HOSPITAL_COMMUNITY): Payer: Medicare Other

## 2020-05-22 ENCOUNTER — Inpatient Hospital Stay (HOSPITAL_COMMUNITY): Payer: Medicare Other | Admitting: Speech Pathology

## 2020-05-22 ENCOUNTER — Inpatient Hospital Stay (HOSPITAL_COMMUNITY): Payer: Medicare Other | Admitting: Physical Therapy

## 2020-05-22 LAB — BASIC METABOLIC PANEL
Anion gap: 11 (ref 5–15)
BUN: 15 mg/dL (ref 6–20)
CO2: 24 mmol/L (ref 22–32)
Calcium: 8.6 mg/dL — ABNORMAL LOW (ref 8.9–10.3)
Chloride: 104 mmol/L (ref 98–111)
Creatinine, Ser: <0.3 mg/dL — ABNORMAL LOW (ref 0.61–1.24)
Glucose, Bld: 85 mg/dL (ref 70–99)
Potassium: 3.9 mmol/L (ref 3.5–5.1)
Sodium: 139 mmol/L (ref 135–145)

## 2020-05-22 LAB — CBC WITH DIFFERENTIAL/PLATELET
Abs Immature Granulocytes: 0.26 10*3/uL — ABNORMAL HIGH (ref 0.00–0.07)
Basophils Absolute: 0.1 10*3/uL (ref 0.0–0.1)
Basophils Relative: 1 %
Eosinophils Absolute: 0.1 10*3/uL (ref 0.0–0.5)
Eosinophils Relative: 1 %
HCT: 35.9 % — ABNORMAL LOW (ref 39.0–52.0)
Hemoglobin: 11.7 g/dL — ABNORMAL LOW (ref 13.0–17.0)
Immature Granulocytes: 3 %
Lymphocytes Relative: 15 %
Lymphs Abs: 1.3 10*3/uL (ref 0.7–4.0)
MCH: 27.9 pg (ref 26.0–34.0)
MCHC: 32.6 g/dL (ref 30.0–36.0)
MCV: 85.7 fL (ref 80.0–100.0)
Monocytes Absolute: 0.9 10*3/uL (ref 0.1–1.0)
Monocytes Relative: 10 %
Neutro Abs: 6.3 10*3/uL (ref 1.7–7.7)
Neutrophils Relative %: 70 %
Platelets: 168 10*3/uL (ref 150–400)
RBC: 4.19 MIL/uL — ABNORMAL LOW (ref 4.22–5.81)
RDW: 15.4 % (ref 11.5–15.5)
WBC: 8.9 10*3/uL (ref 4.0–10.5)
nRBC: 0 % (ref 0.0–0.2)

## 2020-05-22 LAB — GLUCOSE, CAPILLARY: Glucose-Capillary: 89 mg/dL (ref 70–99)

## 2020-05-22 MED ORDER — LISINOPRIL 2.5 MG PO TABS
2.5000 mg | ORAL_TABLET | Freq: Every day | ORAL | Status: DC
  Administered 2020-05-22: 2.5 mg via ORAL
  Filled 2020-05-22 (×2): qty 1

## 2020-05-22 NOTE — Progress Notes (Signed)
[place] PHYSICAL MEDICINE & REHABILITATION PROGRESS NOTE   Subjective/Complaints: Tolerated bowel program well last night. Has no complains this morning.   ROS: Pt denies SOB, CP N/V/D  Objective:   No results found. No results for input(s): WBC, HGB, HCT, PLT in the last 72 hours. No results for input(s): NA, K, CL, CO2, GLUCOSE, BUN, CREATININE, CALCIUM in the last 72 hours.  Intake/Output Summary (Last 24 hours) at 05/22/2020 1051 Last data filed at 05/22/2020 0900 Gross per 24 hour  Intake 822 ml  Output 302 ml  Net 520 ml     Physical Exam: Vital Signs Blood pressure (!) 142/90, pulse 76, temperature 97.7 F (36.5 C), temperature source Oral, resp. rate 18, height (P) 5\' 10"  (1.778 m), weight (P) 61.5 kg, SpO2 98 %.   General: Alert and oriented x 3, No apparent distress HEENT: Head is normocephalic, atraumatic, PERRLA, EOMI, sclera anicteric, oral mucosa pink and moist, dentition intact, ext ear canals clear,  Neck: Supple without JVD or lymphadenopathy Heart: Reg rate and rhythm. No murmurs rubs or gallops Chest: CTA bilaterally without wheezes, rales, or rhonchi; no distress Abdomen: Soft, non-tender, non-distended, bowel sounds positive. Extremities: No clubbing, cyanosis, or edema. Pulses are 2+ Skin: Clean and intact without signs of breakdown RUE- biceps 4-/5, WE 2-/5, triceps 1/5, grip 2-/5, finger abd 1/5 LUE- biceps 3/5, WE 2-/5, triceps 2-/5, grip 1/5, finger abd 1/5 RLE- HF, KE, DF, PF all 1/5 LLE- HF 2-/5; otherwise 0/5 in LLE  Neurological:Ox2  Skin: Skin is warm and dry.   Psychiatric: normal mood and affect  Assessment/Plan: 1. 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  Physiatrist is providing close team supervision and 24 hour management of active medical problems listed below.  Physiatrist and  rehab team continue to assess barriers to discharge/monitor patient progress toward functional and medical goals  Care Tool:  Bathing  Bathing activity did not occur: Safety/medical concerns Body parts bathed by patient: Chest, Abdomen, Front perineal area   Body parts bathed by helper: Right arm, Left arm, Chest, Abdomen, Front perineal area, Buttocks, Right upper leg, Left upper leg, Right lower leg, Left lower leg, Face Body parts n/a: Right arm, Left arm, Buttocks, Right upper leg, Left upper leg, Right lower leg, Left lower leg, Face   Bathing assist Assist Level: Total Assistance - Patient < 25%     Upper Body Dressing/Undressing Upper body dressing   What is the patient wearing?: Pull over shirt    Upper body assist Assist Level: Total Assistance - Patient < 25%    Lower Body Dressing/Undressing Lower body dressing      What is the patient wearing?: Incontinence brief, Pants     Lower body assist Assist for lower body dressing: Total Assistance - Patient < 25%     Toileting Toileting Toileting Activity did not occur Landscape architect and hygiene only): N/A (no void or bm)  Toileting assist Assist for toileting: 2 Helpers     Transfers Chair/bed transfer  Transfers assist     Chair/bed transfer assist level: 2 Helpers     Locomotion Ambulation   Ambulation assist   Ambulation activity did not occur: Safety/medical concerns          Walk 10 feet activity   Assist  Walk 10 feet activity did not occur: Safety/medical concerns        Walk 50  feet activity   Assist Walk 50 feet with 2 turns activity did not occur: Safety/medical concerns         Walk 150 feet activity   Assist Walk 150 feet activity did not occur: Safety/medical concerns         Walk 10 feet on uneven surface  activity   Assist Walk 10 feet on uneven surfaces activity did not occur: Safety/medical concerns         Wheelchair     Assist Will patient  use wheelchair at discharge?: Yes Type of Wheelchair: Power Wheelchair activity did not occur: Safety/medical concerns  Wheelchair assist level: Minimal Assistance - Patient > 75% Max wheelchair distance: 150'    Wheelchair 50 feet with 2 turns activity    Assist    Wheelchair 50 feet with 2 turns activity did not occur: Safety/medical concerns   Assist Level: Minimal Assistance - Patient > 75%   Wheelchair 150 feet activity     Assist  Wheelchair 150 feet activity did not occur: Safety/medical concerns   Assist Level: Minimal Assistance - Patient > 75%   Blood pressure (!) 142/90, pulse 76, temperature 97.7 F (36.5 C), temperature source Oral, resp. rate 18, height (P) 5\' 10"  (1.778 m), weight (P) 61.5 kg, SpO2 98 %.  Medical Problem List and Plan: 1.  Decreased functional mobility/C4 ASIA B/C myelopathy with neurogenic bowel and bladder and mild spasticity secondary to neurofibromatosis type II with multiple prior surgeries for meningioma neurofibroma resection and most recent resection of dural based spinal tumor 06/27/1750 complicated by CSF leak wound revision.  Antibiotic therapy completed 04/28/2020- parents say 6/14- PICC out             -patient may  shower             -ELOS/Goals: goals min-mod A- 3-4 weeks  -Continue CIR 2.  Antithrombotics: -DVT/anticoagulation: Subcutaneous heparin.  Check vascular study 6/23- (-) for DVTs 6/26- change to Lovenox since no renal issues and not allergic             -antiplatelet therapy: n/a 3. Pain Management: Baclofen 10 mg twice daily for spasticity. Well controlled 4. Mood: not on any meds             -antipsychotic agents: n/a 5. Neuropsych: This patient is somewhat capable of making decisions on his own behalf. 6. Skin/Wound Care: no open skin areas- however has an area of irritation near anus- Stage I but TINY- will monitor  7/1: Added Eucerin cream for dry feet. 7. Fluids/Electrolytes/Nutrition: regular diet with  thin liquids- need SLP for dysarthria/dysphagia and cognition 8.  Neurogenic bowel and bladder.  Suppository nightly as needed, MiraLAX daily.  Check PVR's  6/23- PVRs ordered, but don't see recorded in chart- will order again, since need to know if pt retaining- having many incontinent stools- will d/w pt/parents about putting on bowel program.   6/24- d/w pt and father about bowel program - 2nd time saw pt- discussed at length. They will think about it.  6/25- will start bowel program per pt request.   6/26- went well last night- con't regimen  6/27- responding well to bowel program; needing occ I/o caths as well per nursing  6/29- needs to do bowel program- refused las tnight since thought getting control- explained if can go in bedpan, that's control- if not, he's just having accidents.   6/30- d/w pt- he's willing again to try bowel program 7/5: Had BM with bowel program yesterday  9.  Seizure disorder.  Keppra 1000 mg twice daily, Inderal 10 mg twice daily. Seizure precautions.   6/30- pt had episode of lethargy/sedation- couldn't wake him up, even with sternal rub, was documented- will consult Neurology- due to hx of seizures- and also order labs- to make sure no infection- recent hx of menigitis  7/1: Discussed with patient that EEG and labs were stable yesterday, with the exception of leukocytosis. Repeat CBC tomorrow to trend.  10.  Hyponatremia.  Continue sodium chloride tablets 3 times daily.  Follow-up chemistries- last Na 136 per father  6/23- Na 136- con't salt tabs 11. Neurofibromatosis-2- was getting steroids- per Dr Prince Rome can reduce by 20% every few days;  -will also try and contact Dr Shaaron Adler about possible additional chemotherapy- Dr Prince Rome was CLEAR can use estim, since not cancerous dx.   6/23- will wean Decadron to 4 mg q12 hours from q8 hours for 2 days, then con't to wean.  6/25- decrease steroids due to 2mg  q12 hours today   6/27- will decrease to 2 mg daily. In 3 days,  will stop, if tolerating well, esp since got thrush from steroids.   7/2: discontinued Decadron- course completed 12. Impaired function             -will order grey call bell for pt.  13. Hyperkalemia  6/23- K+ 5.7- hemolyzed- will recheck in AM  6/24- K= 4.3 14. Thrush  6/26- add diflucan 200 mg x1 then 100 mg daily x 6 days then nystatin S&S QID x 5 days.  6/29- thrush gone from oropharynx - off steroids which should prevent recurrence  15. Dysphagia: continue dysphagia 3 diet and SLP treatment.  6/29- needs to get meds crushed in pudding/applesauce  16. ABLA: Hgb stable 7/2 17. Leukocytosis: WBC stable on 7/2 18. HTN: diastolic BP elevated: start low dose Lisinopril    LOS: 13 days A FACE TO FACE EVALUATION WAS PERFORMED  Fraser Busche P Sherrine Salberg 05/22/2020, 10:51 AM

## 2020-05-22 NOTE — Progress Notes (Signed)
Speech Language Pathology Daily Session Note  Patient Details  Name: Cameron Hale [name]: [id number] Date of Birth: [birth year]  Today's Date: 05/22/2020 SLP Individual Time: 0160-1093 SLP Individual Time Calculation (min): 55 min  Short Term Goals: Week 2: SLP Short Term Goal 1 (Week 2): Patient will consume current diet with minimal overt s/s of aspiration with Mod I. SLP Short Term Goal 2 (Week 2): Patient will demonstrate efficient mastication with trials of regular textures with complete oral clearance without overt s/s of aspiration over 2 sessions prior to upgrade with supervision verbal cues. SLP Short Term Goal 3 (Week 2): Patient will utilize diaphramatic breathing at the phrase level with Mod A verbal cues to achieve ~75% intelligibility. SLP Short Term Goal 4 (Week 2): Patient will perform 25 repetitions of EMST at 7 cm H2O with Max A multimodal cues with a self-perceived effort level of 7/10 without reports of dizziness, etc. SLP Short Term Goal 5 (Week 2): Patient will perform 25 repetitions of IMST at 13 cm H2O with Max A multimodal cues with a self-perceived effort level of 7/10 without reports of dizziness, etc.  Skilled Therapeutic Interventions: Skilled treatment session focused on speech goals. SLP facilitated session by providing extra time and Min A verbal cues for patient to perform 25 repetitions of EMST and IMST at a self-perceived effort level of 8/10. Patient also participated in a verbal description task that focused on speech intelligibility at the sentence level in which patient required Min verbal cues to achieve ~90% intelligibility. Patient transferred back to bed at end of session with total A +2 via the slideboard. Patient left upright in bed with alarm on and all needs within reach. Continue with current plan of care.      Pain No/Denies Pain   Therapy/Group: Individual Therapy  Britt Theard 05/22/2020, 3:09 PM

## 2020-05-22 NOTE — Progress Notes (Signed)
EEG ordered , currently technician at bedside

## 2020-05-22 NOTE — Significant Event (Signed)
Rapid Response Event Note  Overview: Time Called: 1742 Arrival Time: 7471 Event Type: Neurologic  Staff found patient unresponsive. BP 97/68  HR 79  RR 16  O2 sat 99% on RA  Initial Focused Assessment: Patient responded to deep painful stimuli Open eyes, nodded head when asked how he felt.  He did speak a few words but not much.  Pupils equal and reactive. Noted intermittent "twich" of right eye and jaw.  He has been receiving Keppra 1500mg  IV or PO BID since 6/30  He open his mouth for oral care, abrasion noted on tongue, patient denies painful tongue.  He does fall back asleep and opens his eyes weakly to voice.  BP 93/64  HR 75  RR 16  O2 sat 99% on RA Rectal temp 97 CBG 89  Interventions:  Plan of Care (if not transferred):  Event Summary: Name of Physician Notified: Zella Ball APP at 1815    at       Event End Time: 8550  Raliegh Ip

## 2020-05-22 NOTE — Progress Notes (Signed)
Physical Therapy Session Note  Patient Details  Name: Cameron Hale [name]: [id number] Date of Birth: 22-Jul-1976  Today's Date: 05/22/2020 PT Individual Time: 1100-1200 PT Individual Time Calculation (min): 60 min   Short Term Goals: Week 2:  PT Short Term Goal 1 (Week 2): Pt will perform bed mobility with max A x1 consistently. PT Short Term Goal 2 (Week 2): Pt will maintain static sitting balance x10 minutes with mod A. PT Short Term Goal 3 (Week 2): Pt will drive 478 feet using power wheelchair with close supervision.  Skilled Therapeutic Interventions/Progress Updates:  Pt received seated in power W/C in room. Agreeable to therapy. Denies any pain. Pt drove 150 feet from room to therapy gym using power W/C at a close supervision level overall, with min A occasionally required for steering. Despite placement of a 2-inch stress ball on joystick for improved grip, R wrist splint for improved control, and a pillow under RUE for support, pt still exhibits difficulty controlling joystick d/t RUE strength and coordination deficits. Slide board transfer power W/C <> mat table with A x2 with total A x1 and min A x1. Sit <> supine on mat table with A x2 with total A x1 and min A x1. Pt performs scooting on mat table in long sitting with A x2 with total A x1 and max A x1 for shoulder/trunk management. While long sitting, pt demonstrates difficulty maintain upright posture d/t tightness in B/L hamstrings. With pt supine on mat table, performed stretching of B/L hamstrings 2 x 1 minute each side. Remainder of session focused on improving bed mobility. On the mat table, pt performed rolling L 1 x 5 with A x2 (mod A d/t LE management by one person, initially min then mod A for trunk management by second person). On the mat table, pt performed rolling R 1 x 5 with A x2 (mod A d/t LE management by one person, min A for trunk management by second person).  Dependent power W/C transfer from therapy gym to room for time  conservation. Pt left seated in power W/C in room with all needs in reach. All questions answered.  Therapy Documentation Precautions:  Precautions Precautions: Fall Precaution Comments: seizure precautions Restrictions Weight Bearing Restrictions: No  Therapy/Group: Individual Therapy   Caitlin Hillmer, SPT  05/22/2020, 12:53 PM

## 2020-05-22 NOTE — Progress Notes (Signed)
EEG complete - results pending 

## 2020-05-22 NOTE — Progress Notes (Signed)
Called to room by primary RN and NT with concerns of pt being lethargic. Pt noted with involuntary jaw movement and overall weakness. Pt opens eyes when called by name but unable to keep eyes open. When asked questions pt was unable to answer verbally. Rectal temp of 97.0, other vital signs at baseline. Rapid response contacted to assist. Pt likely in a postictal period according to RR nurse. Primary RN contacting the on call provider at this time. RR nurse currently in room with pt. Continue plan of care.   Gerald Stabs, RN

## 2020-05-22 NOTE — Consult Note (Signed)
Requesting Physician: Dr. Clair Gulling    Chief Complaint: Altered mental status  History obtained from: Patient and Chart   HPI:                                                                                                                                       Cameron Hale is a 44 y.o. male with past medical history of neurofibromatosis type II, multiple prior surgeries for meningioma resection, spinal tumor, quadriparesis, prior history of intracranial hemorrhage in 2009, seizure disorder consulted for concern for seizure-like activity.  Around 5:30 PM, nurse witnessed patient was lethargic and not responding and rapid response was called. On assessment by rapid response patient nodded his head and spoke a few words. There was intermittent twitching of his right eye. Neurology was consulted for concern for seizures.  Patient apparently had similar episode on 6/30 and started on IV Keppra 1500 twice daily. Routine EEG at the time was unremarkable.     Past Medical History:  Diagnosis Date  . NF2 (neurofibromatosis 2) ([place])     Past Surgical History:  Procedure Laterality Date  . brain bleed     mri 2009  . HAND EXPLORATION    . LUMBAR FUSION    . NEPHRECTOMY     Partial left nephrectomy   . SPINAL FUSION      History reviewed. No pertinent family history. Social History:  reports that he has never smoked. He has never used smokeless tobacco. He reports previous alcohol use. He reports that he does not use drugs.  Allergies:  Allergies  Allergen Reactions  . Amoxicillin Itching  . Morphine Itching    Medications:                                                                                                                        I reviewed home medications   ROS:  14 systems reviewed and negative except above    Examination:                                                                                                       General: Appears well-developed and well-nourished.  Psych: Affect appropriate to situation Eyes: No scleral injection HENT: No OP obstrucion Head: Normocephalic.  Cardiovascular: Normal rate and regular rhythm.  Respiratory: Effort normal and breath sounds normal to anterior ascultation GI: Soft.  No distension. There is no tenderness.  Skin: WDI    Neurological Examination Mental Status: Awake, does not speak but follows simple commands such as closing his eyes. Cranial Nerves: II: Visual fields : Blinks to threat bilaterally III,IV, VI: ptosis not present, no forced gaze deviation, pupils equal, round, reactive to light and accommodation VII: smile symmetric XII: midline tongue extension Motor: Withdraws minimally against gravity in all 4 extremities Tone and bulk:normal tone throughout; no atrophy noted Sensory: As above Plantars: Right: downgoing   Left: downgoing Cerebellar: Unable to assess      Lab Results: Basic Metabolic Panel: Recent Labs  Lab 05/17/20 1450 05/22/20 1933  NA 137 139  K 4.5 3.9  CL 102 104  CO2 25 24  GLUCOSE 116* 85  BUN 15 15  CREATININE 0.34* <0.30*  CALCIUM 9.1 8.6*    CBC: Recent Labs  Lab 05/17/20 1450 05/19/20 0603 05/22/20 1933  WBC 13.2* 9.2 8.9  NEUTROABS 10.8* 5.9 6.3  HGB 11.9* 11.6* 11.7*  HCT 36.8* 35.8* 35.9*  MCV 86.8 85.2 85.7  PLT 220 202 168    Coagulation Studies: No results for input(s): LABPROT, INR in the last 72 hours.  Imaging: No results found.   I have reviewed the above imaging: No imaging completed yet, MRI brain ordered   ASSESSMENT AND PLAN   44 year old male with neurofibromatosis type II consulted for sudden onset altered mental status, right facial twitching.  Patient likely postictal as he is following commands.  Also possible that this may be behavioral episode.  Will obtain continuous EEG to  further characterize spells and look for subclinical seizures.  Reviewed EEG: Muscle artifact, disorganized background but no rhythmic activity concerning for seizures during my review.  Will look at it intermittently throughout the night.    Seizure-like activity : Partial seizure with postictal state versus behavioral spell Altered mental status  Recommendations Continuous EEG for 24 hours MRI brain with bed without contrast Continue Keppra 1500 twice daily Seizure precautions   Damari Suastegui [facility] Pager Number [phone]

## 2020-05-22 NOTE — Progress Notes (Signed)
vLTM started   [facility] to monitoring  Same leads used for baseline and ltm.   Event button tested

## 2020-05-22 NOTE — Progress Notes (Signed)
Occupational Therapy Session Note  Patient Details  Name: Cameron Hale [name]: [id number] Date of Birth: 1976-09-07  Today's Date: 05/22/2020 OT Individual Time: 0233-4356 OT Individual Time Calculation (min): 70 min    Short Term Goals: Week 2:  OT Short Term Goal 1 (Week 2): Pt will perform UB self care with max A. OT Short Term Goal 2 (Week 2): Pt will maintain static sitting balance with min A on EOB for 3 minutes. OT Short Term Goal 3 (Week 2): Pt will utilized AE and modifications as needed for self feeding with mod A.  Skilled Therapeutic Interventions/Progress Updates:    OT intervention with focus on bed mobility, sitting balance, BUE functional use, and w/c mobility in PWC. Upon arrival, pt informed OTA and he needed a new brief. Pt incontinent of bowel and bladder.  Dependent for hygiene.  Rolling R/L to facilitate hygiene and donning pants with max A+2. Supine>sit EOB with tot A+2. SB transfer to [facility] with tot A+2. Donning pullover shirt with max A. Pt attempted driving [place] but will need "goal post" controller knob to be successful.  Adjustments made to arm rests for comfort. Unsupported sitting balance leaning forward in w/c with min A for 3 miins X 3. Pt remained in [place] with seat belt secure and all needs within reach.   Therapy Documentation Precautions:  Precautions Precautions: Fall Precaution Comments: seizure precautions Restrictions Weight Bearing Restrictions: No  Pain:  Pt denies pain this morning   Therapy/Group: Individual Therapy  Leroy Libman 05/22/2020, 9:30 AM

## 2020-05-22 NOTE — Progress Notes (Addendum)
Writer called to room at 1735 by NT due to patient not responding at baseline. Patient noted to be laying in bed with mouth open and eyelids partially open. Patient initially not responding to voice and minimally responsive to touch. Vital signs obtained, charge RN called in to assist. Rapid response nurse called to bedside for further assessment. Jaw and right face twitching, patient did arouse to baseline briefly, then quickly returned to previous state.Safety maintained ion bed. Per RR nurse, patient appears postictal.  Call placed to on call NP, currently awaiting orders.   New orders obtained, NP to consult with Neurology and will call back. Will update oncoming nurse.

## 2020-05-22 NOTE — Progress Notes (Signed)
Received a call from [place] at 18:20, reporting Cameron Hale was found un-responsive and Rapid Response Nurse was called to patient bedside, note was reviewed. This provider read Dr Ranell Patrick note, Cameron Hale had a similar incident on 06/30. His Glucose was 89, this provider spoke with Dr Ranell Patrick regarding the above, she wanted Neurology consulted. Dr Aroor was consulted. Blood work ordered, awaiting results. Spoke with charge Nurse regarding the above.

## 2020-05-23 ENCOUNTER — Inpatient Hospital Stay (HOSPITAL_COMMUNITY): Payer: Medicare Other | Admitting: Speech Pathology

## 2020-05-23 ENCOUNTER — Inpatient Hospital Stay (HOSPITAL_COMMUNITY): Payer: Medicare Other | Admitting: Physical Therapy

## 2020-05-23 DIAGNOSIS — R4182 Altered mental status, unspecified: Secondary | ICD-10-CM

## 2020-05-23 NOTE — Progress Notes (Signed)
NEUROLOGY PROGRESS NOTE  Subjective: No current complaints.  Exam: Vitals:   05/23/20 0239 05/23/20 0445  BP: 96/66 98/73  Pulse: 72 70  Resp: 18 16  Temp: (!) 97.1 F (36.2 C) 97.8 F (36.6 C)  SpO2: 99% 100%    Neuro:  Mental Status: Patient currently alert and know his he is at the hospital.  He is able to count fingers and to the best of his ability to follow commands.  He shows no aphasia and speaks in a hypophonic voice  -It should be known that the physician assistant on rehab floor did see him this morning and apparently he was sitting up and having good conversation with him.  Per family, he has been doing this waxing and waning in the past.  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  Left arm antigravity.  Bilateral lower extremities no movement.  No eye twitching or clinical seizure activity noted.  Medications:  Scheduled:  baclofen  10 mg Oral BID   bisacodyl  10 mg Rectal q1800   enoxaparin (LOVENOX) injection  40 mg Subcutaneous Q24H   famotidine  20 mg Oral BID   hydrocerin   Topical QHS   levETIRAcetam  1,500 mg Oral BID   lisinopril  2.5 mg Oral Daily   polyethylene glycol  17 g Oral Daily   propranolol  10 mg Oral BID   senna-docusate  2 tablet Oral BID   sodium chloride  1 g Oral TID WC   Continuous:  JXB:[id number], sorbitol  Pertinent Labs/Diagnostics:   Overnight EEG with video  Result Date: 05/23/2020 Cameron Levins, DO     05/23/2020  9:37 AM CPT/Type of Study: 65784; 24hr EEG with video Recording date: 05/22/20 21:24 - 05/23/20 09:24 Interpreting  Physician: Izora Ribas, DO History: This is a 44 year old patient, undergoing an EEG to evaluate for seizures. Technical Description: The EEG was performed using standard setting per the guidelines of American Clinical Neurophysiology Society (ACNS). A minimum of 21 electrodes were placed on scalp according to the International 10-20 or/and 10-10 Systems. Supplemental electrodes were placed as needed. Single EKG electrode was also used to detect cardiac arrhythmia. Patients behavior was continuously recorded on video simultaneously with EEG. A minimum of 16 channels were used for data display. Each epoch of study was reviewed manually daily and as needed using standard referential and bipolar montages. Computerized quantitative EEG analysis (such as compressed spectral array analysis, trending, automated spike & seizure detection) were used as indicated. Clinical State: Awake and asleep Background: The posterior dominant rhythm was recorded at a rate of 8Hz , symmetric, waxing and waning, and reduced by eye opening Overall Amplitude: Normal Predominant Frequency: Alpha Asymmetry: Yes Sleep background: Stage II Abnormalities: Intermittent slow left frontal and generalized Rhythmic or periodic pattern: None Epileptiform activity: No Electrographic Seizure: No Events: No Breach rhythm: No Reactivity: Yes Stimulation procedures: Hyperventilation: Not done Photic stimulation: Not done Impression: -slow left frontal and generalized This EEG shows evidence of cerebral dysfunction in the left frontal region and a mild diffuse encephalopathy. No epileptiform discharges or EEG seizures were recorded.     Cameron Quill PA-C  [facility] [phone]  Assessment:  44 year old male with neurofibromatosis type II which neuro was consulted for sudden onset of altered mental status along with right facial twitching.  At that time was a question of postictal state.  There is also question that this may be  behavioral episode.  LTM was obtained which did not show any epileptiform activity overnight.  In discussion with physician assistant that works on [employer], family states that he will have episodes very much like this.  He also states that this morning patient was sitting up and having a conversation with him prior to my exam.  My exam is as above.  Still awaiting MRI of brain.  We will continue EEG overnight for another 24 hours.   Impression: -Partial seizure with postictal states versus behavioral spells.  Recommendations: -Continuous EEG for another 24 hours -MRI brain without contrast -Continue Keppra 1500 mg daily -Seizure precautions    05/23/2020, 10:07 AM

## 2020-05-23 NOTE — Patient Care Conference (Signed)
Inpatient RehabilitationTeam Conference and Plan of Care Update Date: 05/23/2020   Time: 3:51 PM    Patient Name: Cameron Hale      Medical Record Number: [id number]  Date of Birth: 1976-04-08 Sex: Male         Room/Bed: 4W22C/4W22C-01 Payor Info: Payor: MEDICARE / Plan: MEDICARE PART A AND B / Product Type: *No Product type* /    Admit Date/Time:  05/09/2020  1:48 PM  Primary Diagnosis:  Quadriplegia [facility])  Hospital Problems: Principal Problem:   Quadriplegia ([place]) Active Problems:   NF2 (neurofibromatosis 2) ([place])   Incomplete paraplegia [facility])    Expected Discharge Date: Expected Discharge Date: 06/07/20  Team Members Present: Physician leading conference: Dr. Leeroy Cha Care Coodinator Present: Loralee Pacas, LCSWA;Jamiesha Victoria Creig Hines, RN, BSN, [place] Nurse Present: Other (comment) Janeal Holmes, RN) PT Present: Excell Seltzer, PT OT Present: Willeen Cass, OT;Roanna Epley, COTA SLP Present: Weston Anna, SLP PPS Coordinator present : Gunnar Fusi, SLP     Current Status/Progress Goal Weekly Team Focus  Bowel/Bladder   Patient remains incontinent of B/B, PVR TID > 250 CC I/O cath/Q8 hr no void  To become more continent of bowel and bladder.  Assess toleting needs Q2 hrs /prm   Swallow/Nutrition/ Hydration   Dys. 3 textures with thin liquids, supervision  Mod I  trials of regular textures   ADL's   bathing/dressing-tot A/dependent at bed level and sitting in w/c; SB tranfsers tot A+2; sitting balance-min/mod A; bed mobility-max A+2  bathing-mod A; UB dressing-min A; LB dressing-mod A; toileting-max A; toilet transfers-max A; self feeding-min A  bed mobility, functional tranfsers, sitting balance, activity tolerance   Mobility   max A rolling, total A supine to/from sit, total A to +2 SB transfer, min A power w/c mobility  mod A overall at w/c level, will likely need to be downgraded  bed mobility, transfers, power w/c mobility   Communication   Min A  Mod I   use of diaphrgamatic breathing, RMT   Safety/Cognition/ Behavioral Observations            Pain   denies pain throughout shift, prior discomfort verbalizedfor head and hand discomfort  To help bring pain level below 2/10.  QS/PRN assess and address pains need   Skin   Ecchymosis areas resolving to bilateral abdomen, MASD -buttocks breas applied per order  To promote healing and preventing breakdown from occurring.  QS/PRN assess, and treat per orders and protocol     Team Discussion:  Discharge Planning/Teaching Needs:  D/c location pending care needs (i.e. home vs to parent's home). Sw waiting on follow-up from wife on d/c location. Pt WB precautions to be lifted this week, however, will not be able to lift anything more than 5lbs. Both homes have w/c ramp access.  Family education as recommended by therapy   Current Update:  Pt lethargic. Incontinent of bowel and bladder. Total assist for mobility-not making progress towards mod assist goals, likely to downgrade. Not making much progress with functional goals, likely to downgrade. Making gains on RMT goals with SLP. Unsure of discharge location.  Current Barriers to Discharge:  Decreased caregiver support, Neurogenic bowel and bladder, Lack of/limited family support and Weight bearing restrictions  Possible Resolutions to Barriers: Continue bowel and bladder program and training with the family. Weightbearing restrictions to be lifted this week but not to lift over 5 lbs. Continue family education as recommended by therapy.  Patient on target to meet rehab goals: yes  *See  Care Plan and progress notes for long and short-term goals.   Revisions to Treatment Plan:  none    Medical Summary Current Status: Currently unresponsive, neurology consulted and he is on 24H EEG monitoring. He is unable to swallow any medications, hypotensive Weekly Focus/Goal: Follow-up with neurology, continue 24 H EEG monitoring, MRI brain needed when off  EEG, hold therapy, hold medications  Barriers to Discharge: Medical stability;Neurogenic Bowel & Bladder  Barriers to Discharge Comments: Medically complex and currently unresponsive undergoing workup Possible Resolutions to Barriers: Follow-up with neurology, continue 24 H EEG monitoring, MRI brain needed when off EEG, hold therapy, hold medications   Continued Need for Acute Rehabilitation Level of Care: The patient requires daily medical management by a physician with specialized training in physical medicine and rehabilitation for the following reasons: Direction of a multidisciplinary physical rehabilitation program to maximize functional independence : Yes Medical management of patient stability for increased activity during participation in an intensive rehabilitation regime.: Yes Analysis of laboratory values and/or radiology reports with any subsequent need for medication adjustment and/or medical intervention. : Yes   I attest that I was present, lead the team conference, and concur with the assessment and plan of the team.   Cristi Loron 05/23/2020, 3:51 PM

## 2020-05-23 NOTE — Progress Notes (Signed)
Speech Language Pathology Daily Session Note  Patient Details  Name: Cameron Hale [name]: [id number] Date of Birth: 1976-08-11  Today's Date: 05/23/2020 SLP Individual Time: 1255-1340 SLP Individual Time Calculation (min): 45 min  Short Term Goals: Week 2: SLP Short Term Goal 1 (Week 2): Patient will consume current diet with minimal overt s/s of aspiration with Mod I. SLP Short Term Goal 2 (Week 2): Patient will demonstrate efficient mastication with trials of regular textures with complete oral clearance without overt s/s of aspiration over 2 sessions prior to upgrade with supervision verbal cues. SLP Short Term Goal 3 (Week 2): Patient will utilize diaphramatic breathing at the phrase level with Mod A verbal cues to achieve ~75% intelligibility. SLP Short Term Goal 4 (Week 2): Patient will perform 25 repetitions of EMST at 7 cm H2O with Max A multimodal cues with a self-perceived effort level of 7/10 without reports of dizziness, etc. SLP Short Term Goal 5 (Week 2): Patient will perform 25 repetitions of IMST at 13 cm H2O with Max A multimodal cues with a self-perceived effort level of 7/10 without reports of dizziness, etc.  Skilled Therapeutic Interventions: Skilled treatment session focused on dysphagia and speech goals. Upon arrival, patient was asleep but was able to awaken to voice and maintain arousal. Patient consumed his lunch meal of Dys. 3 textures with thin liquids with intermittent, subtle throat clearing. Prolonged mastication was noted and patient requested pureed food items on his tray. Suspect mild difficulty with mastication and swallowing is due to ongoing lethargy. Recommend to continue current diet with full supervision. SLP also facilitated session by providing Mod A verbal cues for use of diaphragmatic breathing at the word and phrase level to maximize vocal intensity and overall speech intelligibility at the phrase level to ~90%. IMST and EMST exercises were not attempted  today due to ongoing lethargy. Patient left upright in bed with alarm on and all needs within reach. Continue with current plan of are.      Pain No/Denies Pain   Therapy/Group: Individual Therapy  [name] 05/23/2020, 1:48 PM

## 2020-05-23 NOTE — Progress Notes (Signed)
Orthopedic Tech Progress Note Patient Details:  JENKINS RISDON 31-Mar-1976 [id number]  Ordered patient's brace Patient ID: [name], male   DOB: 1976/10/24, 44 y.o.   [name]: [id number]   Tammy Sours 05/23/2020, 1:58 PM

## 2020-05-23 NOTE — Progress Notes (Signed)
LTM EEG maintenance complete. Checked multiple leads on head. Electrode site FP1 F7 showed skin breakdown. Ground (forehead)has un blanchable redness. Moved ground Electrode to prevent any further breakdown. Continue to monitor

## 2020-05-23 NOTE — Progress Notes (Signed)
[place] PHYSICAL MEDICINE & REHABILITATION PROGRESS NOTE   Subjective/Complaints: Having an unresponsive episode. Neurology called. No seizure activity noted on EEG thus far.  SW spoke with wife who is consulting with his patients regarding home for discharge- will have ramp access.  ROS: Unable to obtain given mental status  Objective:   Overnight EEG with video  Result Date: 05/23/2020 Corwin Levins, DO     05/23/2020  9:37 AM CPT/Type of Study: 78242; 24hr EEG with video Recording date: 05/22/20 21:24 - 05/23/20 09:24 Interpreting Physician: Izora Ribas, DO History: This is a 44 year old patient, undergoing an EEG to evaluate for seizures. Technical Description: The EEG was performed using standard setting per the guidelines of American Clinical Neurophysiology Society (ACNS). A minimum of 21 electrodes were placed on scalp according to the International 10-20 or/and 10-10 Systems. Supplemental electrodes were placed as needed. Single EKG electrode was also used to detect cardiac arrhythmia. Patient's behavior was continuously recorded on video simultaneously with EEG. A minimum of 16 channels were used for data display. Each epoch of study was reviewed manually daily and as needed using standard referential and bipolar montages. Computerized quantitative EEG analysis (such as compressed spectral array analysis, trending, automated spike & seizure detection) were used as indicated. Clinical State: Awake and asleep Background: The posterior dominant rhythm was recorded at a rate of 8Hz , symmetric, waxing and waning, and reduced by eye opening Overall Amplitude: Normal Predominant Frequency: Alpha Asymmetry: Yes Sleep background: Stage II Abnormalities: Intermittent slow left frontal and generalized Rhythmic or periodic pattern: None Epileptiform activity: No Electrographic Seizure: No Events: No Breach rhythm: No Reactivity: Yes Stimulation procedures: Hyperventilation: Not done Photic  stimulation: Not done Impression: -slow left frontal and generalized This EEG shows evidence of cerebral dysfunction in the left frontal region and a mild diffuse encephalopathy. No epileptiform discharges or EEG seizures were recorded.   Recent Labs    05/22/20 1933  WBC 8.9  HGB 11.7*  HCT 35.9*  PLT 168   Recent Labs    05/22/20 1933  NA 139  K 3.9  CL 104  CO2 24  GLUCOSE 85  BUN 15  CREATININE <0.30*  CALCIUM 8.6*    Intake/Output Summary (Last 24 hours) at 05/23/2020 1115 Last data filed at 05/23/2020 0502 Gross per 24 hour  Intake 522 ml  Output 625 ml  Net -103 ml     Physical Exam: Vital Signs Blood pressure (!) 152/82, pulse 71, temperature 97.8 F (36.6 C), resp. rate 17, height (P) 5\' 10"  (1.778 m), weight (P) 61.5 kg, SpO2 100 %.  General: Unresponsive, No apparent distress HEENT: Head is normocephalic, atraumatic, eyes closed, whites of eyes visible.  EEG monitoring in place.  Neck: Supple without JVD or lymphadenopathy Heart: Reg rate and rhythm. No murmurs rubs or gallops Chest: CTA bilaterally without wheezes, rales, or rhonchi; no distress Abdomen: Soft, non-tender, non-distended, bowel sounds positive. Extremities: No clubbing, cyanosis, or edema. Pulses are 2+ Skin: Clean and intact without signs of breakdown Unable to perform MMT given neurological status Neurological: Unresponsive, even to sternal rub.   Assessment/Plan: 1. 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  Physiatrist is providing close team supervision and 24 hour management of active medical problems listed below.  Physiatrist and rehab team continue to assess barriers to discharge/monitor patient progress toward functional and medical goals  Care Tool:  Bathing  Bathing activity did not occur: Safety/medical concerns Body parts bathed  by patient: Chest, Abdomen, Front perineal area   Body parts bathed by helper: Right arm, Left arm, Chest, Abdomen, Front perineal area, Buttocks, Right upper leg, Left upper leg, Right lower leg, Left lower leg, Face Body parts n/a: Right arm, Left arm, Buttocks, Right upper leg, Left upper leg, Right lower leg, Left lower leg, Face   Bathing assist Assist Level: Total Assistance - Patient < 25%     Upper Body Dressing/Undressing Upper body dressing   What is the patient wearing?: Pull over shirt    Upper body assist Assist Level: Total Assistance - Patient < 25%    Lower Body Dressing/Undressing Lower body dressing      What is the patient wearing?: Incontinence brief, Pants     Lower body assist Assist for lower body dressing: Total Assistance - Patient < 25%     Toileting Toileting Toileting Activity did not occur Landscape architect and hygiene only): N/A (no void or bm)  Toileting assist Assist for toileting: 2 Helpers     Transfers Chair/bed transfer  Transfers assist     Chair/bed transfer assist level: 2 Helpers     Locomotion Ambulation   Ambulation assist   Ambulation activity did not occur: Safety/medical concerns          Walk 10 feet activity   Assist  Walk 10 feet activity did not occur: Safety/medical concerns        Walk 50 feet activity   Assist Walk 50 feet with 2 turns activity did not occur: Safety/medical concerns         Walk 150 feet activity   Assist Walk 150 feet activity did not occur: Safety/medical concerns         Walk 10 feet on uneven surface  activity   Assist Walk 10 feet on uneven surfaces activity did not occur: Safety/medical concerns         Wheelchair     Assist Will patient use wheelchair at discharge?: Yes Type of Wheelchair: Power Wheelchair activity did not occur: Safety/medical concerns  Wheelchair assist level: Supervision/Verbal cueing Max wheelchair distance: 150 feet     Wheelchair 50 feet with 2 turns activity    Assist    Wheelchair 50 feet with 2 turns activity did not occur: Safety/medical concerns   Assist Level: Supervision/Verbal cueing   Wheelchair 150 feet activity     Assist  Wheelchair 150 feet activity did not occur: Safety/medical concerns   Assist Level: Supervision/Verbal cueing   Blood pressure (!) 152/82, pulse 71, temperature 97.8 F (36.6 C), resp. rate 17, height (P) 5\' 10"  (1.778 m), weight (P) 61.5 kg, SpO2 100 %.  Medical Problem List and Plan: 1.  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  Antibiotic therapy completed 04/28/2020- parents say 6/14- PICC out             -patient may  shower             -ELOS/Goals: goals min-mod A- 3-4 weeks  -Hold therapy 7/6.   -Team conference held 7/6 2.  Antithrombotics: -DVT/anticoagulation: Subcutaneous heparin.  Check vascular study 6/23- (-) for DVTs 6/26- change to Lovenox since no renal issues and not allergic             -  antiplatelet therapy: n/a 3. Pain Management: Baclofen 10 mg twice daily for spasticity. Well controlled 4. Mood: not on any meds             -antipsychotic agents: n/a 5. Neuropsych: This patient is somewhat capable of making decisions on his own behalf. 6. Skin/Wound Care: no open skin areas- however has an area of irritation near anus- Stage I but TINY- will monitor  7/1: Added Eucerin cream for dry feet. 7. Fluids/Electrolytes/Nutrition: regular diet with thin liquids- need SLP for dysarthria/dysphagia and cognition 8.  Neurogenic bowel and bladder.  Suppository nightly as needed, MiraLAX daily.  Check PVR's  6/23- PVRs ordered, but don't see recorded in chart- will order  again, since need to know if pt retaining- having many incontinent stools- will d/w pt/parents about putting on bowel program.   6/24- d/w pt and father about bowel program - 2nd time saw pt- discussed at length. They will think about it.  6/25- will start bowel program per pt request.   6/26- went well last night- con't regimen  6/27- responding well to bowel program; needing occ I/o caths as well per nursing  6/29- needs to do bowel program- refused las tnight since thought getting control- explained if can go in bedpan, that's control- if not, he's just having accidents.   6/30- d/w pt- he's willing again to try bowel program  7/5: Had BM with bowel program yesterday 9.  Seizure disorder.  Keppra 1000 mg twice daily, Inderal 10 mg twice daily. Seizure precautions.   6/30- pt had episode of lethargy/sedation- couldn't wake him up, even with sternal rub, was documented- will consult Neurology- due to hx of seizures- and also order labs- to make sure no infection- recent hx of menigitis  7/1: Discussed with patient that EEG and labs were stable yesterday, with the exception of leukocytosis.  7/6: Repeat seizure episode started yesterday. Neurology consulted. Currently receiving 24 hour EEG and then will receive MRI. Unable to swallow medications at this time.  10.  Hyponatremia.  Continue sodium chloride tablets 3 times daily.  Follow-up chemistries- last Na 136 per father  6/23- Na 136- con't salt tabs 11. Neurofibromatosis-2- was getting steroids- per Dr Prince Rome can reduce by 20% every few days;  -will also try and contact Dr Shaaron Adler about possible additional chemotherapy- Dr Prince Rome was CLEAR can use estim, since not cancerous dx.   6/23- will wean Decadron to 4 mg q12 hours from q8 hours for 2 days, then con't to wean.  6/25- decrease steroids due to 2mg  q12 hours today   6/27- will decrease to 2 mg daily. In 3 days, will stop, if tolerating well, esp since got thrush from steroids.   7/2:  discontinued Decadron- course completed 12. Impaired function             -will order grey call bell for pt.  13. Hyperkalemia  6/23- K+ 5.7- hemolyzed- will recheck in AM  6/24- K= 4.3 14. Thrush  6/26- add diflucan 200 mg x1 then 100 mg daily x 6 days then nystatin S&S QID x 5 days.  6/29- thrush gone from oropharynx - off steroids which should prevent recurrence  15. Dysphagia: continue dysphagia 3 diet and SLP treatment.  6/29- needs to get meds crushed in pudding/applesauce  16. ABLA: Hgb stable 7/2 17. Leukocytosis: WBC stable on 7/2 18. Hypotensive: Stop Lisinopril: BP drops easily- do not add hypertensives.      LOS: 14 days A FACE TO FACE EVALUATION  WAS PERFORMED  Clide Deutscher Marieta Markov 05/23/2020, 11:15 AM

## 2020-05-23 NOTE — Procedures (Signed)
CPT/Type of Study: 39767; 24hr EEG with video Recording date: 05/22/20 21:24 - 05/23/20 09:24 Interpreting Physician: Izora Ribas, DO  History: This is a 44 year old patient, undergoing an EEG to evaluate for seizures.   Technical Description: The EEG was performed using standard setting per the guidelines of American Clinical Neurophysiology Society (ACNS).   A minimum of 21 electrodes were placed on scalp according to the International 10-20 or/and 10-10 Systems. Supplemental electrodes were placed as needed. Single EKG electrode was also used to detect cardiac arrhythmia. Patient's behavior was continuously recorded on video simultaneously with EEG. A minimum of 16 channels were used for data display. Each epoch of study was reviewed manually daily and as needed using standard referential and bipolar montages. Computerized quantitative EEG analysis (such as compressed spectral array analysis, trending, automated spike & seizure detection) were used as indicated.   Clinical State: Awake and asleep Background: The posterior dominant rhythm was recorded at a rate of 8Hz , symmetric, waxing and waning, and reduced by eye opening Overall Amplitude: Normal Predominant Frequency: Alpha  Asymmetry: Yes Sleep background: Stage II  Abnormalities: Intermittent slow left frontal and generalized  Rhythmic or periodic pattern: None  Epileptiform activity: No Electrographic Seizure: No Events: No  Breach rhythm: No Reactivity: Yes  Stimulation procedures:  Hyperventilation: Not done Photic stimulation: Not done  Impression:  -slow left frontal and generalized This EEG shows evidence of cerebral dysfunction in the left frontal region and a mild diffuse encephalopathy. No epileptiform discharges or EEG seizures were recorded.

## 2020-05-23 NOTE — Progress Notes (Addendum)
Late entry 05/22/2020 at 1930 Upon initial assessment patient respond slightly to voice and stimulation,eyes slightly open when name was called, no verbal response,fine tremors to right arm noted, informed by off going nurse patient has been like this and rapid response has been in and assessed patient. VS monitored, Awaiting return call from on call who will contact and consult the Neuro MD,   .

## 2020-05-23 NOTE — Progress Notes (Signed)
Physical Therapy Note  Patient Details  Name: Cameron Hale [name]: [id number] Date of Birth: Dec 01, 1975 Today's Date: 05/23/2020    Per PA pt is on bedrest and 24 hour EEG. Pt missed 75 min total of scheduled skilled therapy session this date due to medical status.    Excell Seltzer, PT, DPT  05/23/2020, 12:58 PM

## 2020-05-23 NOTE — Progress Notes (Signed)
   05/23/20 0939  Assess: MEWS Score  Level of Consciousness Responds to Pain  Assess: MEWS Score  MEWS Temp 0  MEWS Systolic 1  MEWS Pulse 0  MEWS RR 0  MEWS LOC 2  MEWS Score 3  MEWS Score Color Yellow  Assess: if the MEWS score is Yellow or Red  Were vital signs taken at a resting state? Yes  Focused Assessment Documented focused assessment  Early Detection of Sepsis Score *See Row Information* Low  MEWS guidelines implemented *See Row Information* Yes  Treat  MEWS Interventions Escalated (See documentation below)  Take Vital Signs  Increase Vital Sign Frequency  Yellow: Q 2hr X 2 then Q 4hr X 2, if remains yellow, continue Q 4hrs  Escalate  MEWS: Escalate Yellow: discuss with charge nurse/RN and consider discussing with provider and RRT  Notify: Charge Nurse/RN  Name of Charge Nurse/RN Notified M[place]ides  Date Charge Nurse/RN Notified 05/23/20  Time Charge Nurse/RN Notified 35  Notify: Provider  Provider Name/Title Dan, PA  Date Provider Notified 05/23/20  Time Provider Notified 1021  Notification Type Call  Notification Reason Change in status  Response Other (Comment) (awaiting new orders from neuro)  Date of Provider Response 05/23/20  Time of Provider Response 1022  Document  Patient Outcome Not stable and remains on department  Progress note created (see row info) Yes   Patient is lethargic, arousable to painful stimuli. Assessed by neurology and rehab PA this morning. Currently undergoing  24hr EEG. Unable to administer PO meds d/t decreased LOC- MD aware. Neuro paged, awaiting call back

## 2020-05-23 NOTE — Progress Notes (Addendum)
Patient ID: Cameron Hale, male   DOB: 12-31-1975, 44 y.o.   [name]: [id number]  SW followed up with pt wife to provide updates from team conference, and no change in his d/c date. Wife was already aware of the MRI and was here when he left. SW discussed if they have decided what is his d/c location due to DME that will need to be delivered. She states more than likely his parents home and encouraged SW to call his mother. SW discussed care needs of pt and if the family was able to provide the care he requires. Family is currently unsure but willing to do anything that is needed to care for him. SW asked wife to speak with his parents about family education on Friday 10am-3pm to get an idea of care needs. SW also suggested a family conference if needed. SW will follow-up tomorrow to confirm.   Loralee Pacas, MSW, [place] Office: [phone] Cell: [phone] Fax: [phone]

## 2020-05-23 NOTE — Progress Notes (Signed)
Occupational Therapy Note  Patient Details  Name: Cameron Hale [name]: [id number] Date of Birth: 18-Feb-1976  Today's Date: 05/23/2020 OT Missed Time: 66 Minutes Missed Time Reason: Patient on bedrest  Pt missed 60 mins skilled OT services.  24 hour EEG and pt on bedrest.   Leroy Libman 05/23/2020, 8:13 AM

## 2020-05-23 NOTE — Progress Notes (Signed)
Late entry Neurologist in to see patient order received for EEG

## 2020-05-23 NOTE — Progress Notes (Signed)
SLP Cancellation Note  Patient Details Name: Cameron Hale [name]: [id number] DOB: [birth year]   Cancelled treatment:       Patient missed 30 minutes of skilled SLP intervention due to fatigue and inability to awaken long enough to participate in therapy. Medical team is aware. Patient is also on bedrest due to a continuous EEG study.                                                                                                 [place], [place] 05/23/2020, 1:46 PM

## 2020-05-23 NOTE — Progress Notes (Signed)
Notified by patient's mother [name]) regarding current condition,,update provided.The mother informed me that the family is requesting to be notified regarding any changes in status or update Information. Reassurance and support provided, will notify staff and medical team.,

## 2020-05-23 NOTE — Progress Notes (Signed)
Patient was to have MRI today, per Neuro on call, ok to stop EEG for duration of MRI study. Writer called MRI, was told they would call when ready for him, no call received as of 1810. Currently, patient is resting in bed, mental status much improved. He is alert, oriented x3, answering appropriately and eating dinner. EEG remains in process. Father was at bedside, updated.

## 2020-05-24 ENCOUNTER — Inpatient Hospital Stay (HOSPITAL_COMMUNITY): Payer: Medicare Other | Admitting: Physical Therapy

## 2020-05-24 ENCOUNTER — Inpatient Hospital Stay (HOSPITAL_COMMUNITY): Payer: Medicare Other

## 2020-05-24 ENCOUNTER — Inpatient Hospital Stay (HOSPITAL_COMMUNITY): Payer: Medicare Other | Admitting: Speech Pathology

## 2020-05-24 ENCOUNTER — Inpatient Hospital Stay (HOSPITAL_COMMUNITY): Payer: Medicare Other | Admitting: Occupational Therapy

## 2020-05-24 MED ORDER — ALPRAZOLAM 0.25 MG PO TABS
0.1250 mg | ORAL_TABLET | Freq: Once | ORAL | Status: AC
  Administered 2020-05-24: 0.125 mg via ORAL
  Filled 2020-05-24: qty 1

## 2020-05-24 MED ORDER — GADOBUTROL 1 MMOL/ML IV SOLN
6.0000 mL | Freq: Once | INTRAVENOUS | Status: AC | PRN
  Administered 2020-05-24: 6 mL via INTRAVENOUS

## 2020-05-24 NOTE — Progress Notes (Signed)
Physical Therapy Session Note  Patient Details  Name: Cameron Hale [name]: [id number] Date of Birth: 06-13-1976  Today's Date: 05/24/2020 PT Individual Time: 1300-1400 PT Individual Time Calculation (min): 60 min   Short Term Goals: Week 2:  PT Short Term Goal 1 (Week 2): Pt will perform bed mobility with max A x1 consistently. PT Short Term Goal 2 (Week 2): Pt will maintain static sitting balance x10 minutes with mod A. PT Short Term Goal 3 (Week 2): Pt will drive 384 feet using power wheelchair with close supervision.  Skilled Therapeutic Interventions/Progress Updates:    Pt received seated in w/c eating lunch with assist from his nurse. This therapist takes over assisting pt with finishing his lunch. Pt wanting to eat his orange sherbet. Attempted to assist pt with feeding himself the sherbet with use of a spoon in RUE splint and hand-over-hand assist to achieve adequate shoulder elevation to bring spoon to his mouth. Pt exhibits sufficient R elbow flexion to assist with feeding, however due to his positioning and gravity he is unable to adequately feed himself without food falling off the the spoon. Dependently assisted pt with eating remainder of his lunch. Then confirmed with pt's nurse that he needs to have I/O cath performed. Pt reports urge to urinate. Pt unable to void once urinal placed. Assisted pt with utilizing tilt, recline, and LE elevation features on power wheelchair to achieve as supine a position as possible. Pt's nurse and NT then able to assist him with I/O cathing from this position. Provided support for RLE when pt in reclined position. Pt unable to return power wheelchair to upright position from full tilt/recline due to UE weakness and inability to perform ROM against gravity. Pt is able to utilize goal post control to return to upright position with assist from therapist to support RUE. Pt left semi-reclined in power w/c in room with needs in reach at end of  session.  Therapy Documentation Precautions:  Precautions Precautions: Fall Precaution Comments: seizure precautions Restrictions Weight Bearing Restrictions: No    Therapy/Group: Individual Therapy   Excell Seltzer, PT, DPT  05/24/2020, 3:44 PM

## 2020-05-24 NOTE — Progress Notes (Addendum)
NEUROLOGY PROGRESS NOTE  Subjective: Patient has significantly improved today.  He has no complaints  Exam: Vitals:   05/23/20 1928 05/24/20 0537  BP: 116/80 100/81  Pulse: 98 75  Resp: 17 18  Temp: (!) 97.4 F (36.3 C) (!) 97.5 F (36.4 C)  SpO2: 100% 99%     Neuro:  Mental Status: Patient is alert, able to tell me that it is July, 2021, he is in the hospital, and he is able to follow commands. 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  He is able to raise his right arm antigravity.  Difficulty raising his left arm.  Medications:  Scheduled: . baclofen  10 mg Oral BID  . bisacodyl  10 mg Rectal q1800  . enoxaparin (LOVENOX) injection  40 mg Subcutaneous Q24H  . famotidine  20 mg Oral BID  . hydrocerin   Topical QHS  . levETIRAcetam  1,500 mg Oral BID  . polyethylene glycol  17 g Oral Daily  . propranolol  10 mg Oral BID  . senna-docusate  2 tablet Oral BID  . sodium chloride  1 g Oral TID WC   Continuous:   Pertinent Labs/Diagnostics:   Overnight EEG with video  Result Date: 05/23/2020 Corwin Levins, DO     05/23/2020  9:37 AM CPT/Type of Study: 41937; 24hr EEG with video Recording date: 05/22/20 21:24 - 05/23/20 09:24 Interpreting Physician: Izora Ribas, DO History: This is a 44 year old patient, undergoing an EEG to evaluate for seizures. Technical Description: The EEG was performed using standard setting per the guidelines of American Clinical Neurophysiology Society (ACNS). A minimum of 21 electrodes were placed on scalp according to the International 10-20 or/and 10-10 Systems. Supplemental electrodes were placed as needed.  Single EKG electrode was also used to detect cardiac arrhythmia. Patient's behavior was continuously recorded on video simultaneously with EEG. A minimum of 16 channels were used for data display. Each epoch of study was reviewed manually daily and as needed using standard referential and bipolar montages. Computerized quantitative EEG analysis (such as compressed spectral array analysis, trending, automated spike & seizure detection) were used as indicated. Clinical State: Awake and asleep Background: The posterior dominant rhythm was recorded at a rate of 8Hz , symmetric, waxing and waning, and reduced by eye opening Overall Amplitude: Normal Predominant Frequency: Alpha Asymmetry: Yes Sleep background: Stage II Abnormalities: Intermittent slow left frontal and generalized Rhythmic or periodic pattern: None Epileptiform activity: No Electrographic Seizure: No Events: No Breach rhythm: No Reactivity: Yes Stimulation procedures: Hyperventilation: Not done Photic stimulation: Not done Impression: -slow left frontal and generalized This EEG shows evidence of cerebral dysfunction in the left frontal region and a mild diffuse encephalopathy. No epileptiform discharges or EEG seizures were recorded.   LTM: Impression:  -slow left frontal and generalized This EEG shows evidence of cerebral dysfunction in the left frontal region and a mild diffuse encephalopathy. No epileptiform discharges or EEG seizures were recorded.  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 Slowly enlarging en plaque type meningiomas along both anterior and superior cerebral convexities. Associated mildly increased vasogenic edema in the frontal lobes and anterior corpus callosum since March, greater on the left. And partial herniation of the  posterior rectus gyri into the sella turcica (series 9, image 28), new since November. Meanwhile, the dominant central para falcine meningioma is less enhancing and appears slightly smaller since  November. 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  Eman Morimoto PA-C [facility] [phone]  I have seen the patient and reviewed the above note.  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  There was a question of possible postictal state.    Today patient is fully alert and awake and follows all commands.  EEG does not show any epileptiform activity with both spot and LTM.  Given the facial twitching, I do think that there is a possibility that this was simply a prolonged postictal state.  I have seen prolonged postictal states in people with structural issues such as his.  With his return to baseline, I would favor continuing his increased dose of Keppra.  Impression: -Partial seizure with postictal states   Recommendations: -Continue Keppra 1500 mg daily -Continue seizure precautions --With her increased edema, would consider discussing steroids with neurosurgery, however given there is no good endpoint I am not certain of the utility of this. --Neurology will be available on an as-needed basis moving forward.  Roland Rack, MD [facility] [phone]  If 7pm- 7am, please page neurology on call as listed in [facility].  05/24/2020, 12:49 PM

## 2020-05-24 NOTE — Progress Notes (Signed)
SLP Cancellation Note  Patient Details Name: Cameron Hale [name]: [id number] DOB: 1976/10/18   Cancelled treatment:       Patient missed 30 minutes of skilled SLP intervention despite multiple attempts secondary to being off unit for an MRI. Will reschedule as able.                                                                                                 Aayat Hajjar 05/24/2020, 11:55 AM

## 2020-05-24 NOTE — Progress Notes (Signed)
[place] PHYSICAL MEDICINE & REHABILITATION PROGRESS NOTE   Subjective/Complaints: Bright and alert today! Has no complaints. EEG tech is removing leads. About to go for MRI  ROS: Denies pain, constipation, insomnia.   Objective:   Overnight EEG with video  Result Date: 05/23/2020 Cameron Levins, DO     05/23/2020  9:37 AM CPT/Type of Study: 83419; 24hr EEG with video Recording date: 05/22/20 21:24 - 05/23/20 09:24 Interpreting Physician: Izora Ribas, DO History: This is a 44 year old patient, undergoing an EEG to evaluate for seizures. Technical Description: The EEG was performed using standard setting per the guidelines of American Clinical Neurophysiology Society (ACNS). A minimum of 21 electrodes were placed on scalp according to the International 10-20 or/and 10-10 Systems. Supplemental electrodes were placed as needed. Single EKG electrode was also used to detect cardiac arrhythmia. Patient's behavior was continuously recorded on video simultaneously with EEG. A minimum of 16 channels were used for data display. Each epoch of study was reviewed manually daily and as needed using standard referential and bipolar montages. Computerized quantitative EEG analysis (such as compressed spectral array analysis, trending, automated spike & seizure detection) were used as indicated. Clinical State: Awake and asleep Background: The posterior dominant rhythm was recorded at a rate of 8Hz , symmetric, waxing and waning, and reduced by eye opening Overall Amplitude: Normal Predominant Frequency: Alpha Asymmetry: Yes Sleep background: Stage II Abnormalities: Intermittent slow left frontal and generalized Rhythmic or periodic pattern: None Epileptiform activity: No Electrographic Seizure: No Events: No Breach rhythm: No Reactivity: Yes Stimulation procedures: Hyperventilation: Not done Photic stimulation: Not done Impression: -slow left frontal and generalized This EEG shows evidence of cerebral dysfunction  in the left frontal region and a mild diffuse encephalopathy. No epileptiform discharges or EEG seizures were recorded.   Recent Labs    05/22/20 1933  WBC 8.9  HGB 11.7*  HCT 35.9*  PLT 168   Recent Labs    05/22/20 1933  NA 139  K 3.9  CL 104  CO2 24  GLUCOSE 85  BUN 15  CREATININE <0.30*  CALCIUM 8.6*    Intake/Output Summary (Last 24 hours) at 05/24/2020 1319 Last data filed at 05/24/2020 0830 Gross per 24 hour  Intake 897 ml  Output 350 ml  Net 547 ml     Physical Exam: Vital Signs Blood pressure 100/81, pulse 75, temperature (!) 97.5 F (36.4 C), resp. rate 18, height (P) 5\' 10"  (1.778 m), weight (P) 61.5 kg, SpO2 99 %.  General: Alert and oriented x 2, No apparent distress HEENT: Head is normocephalic, atraumatic, PERRLA, EOMI, sclera anicteric, oral mucosa pink and moist, dentition intact, ext ear canals clear,  Neck: Supple without JVD or lymphadenopathy Heart: Reg rate and rhythm. No murmurs rubs or gallops Chest: CTA bilaterally without wheezes, rales, or rhonchi; no distress Abdomen: Soft, non-tender, non-distended, bowel sounds positive. Extremities: No clubbing, cyanosis, or edema. Pulses are 2+ Skin: Clean and intact without signs of breakdown Neuro: RUE- biceps 4-/5, WE 2-/5, triceps 2/5, grip 2-/5, finger abd 1/5 LUE- biceps 3/5, WE 2-/5, triceps 2-/5, grip 1/5, finger abd 1/5 RLE- HF, KE, DF, PF all 1/5 LLE- HF 2-/5; otherwise 0/5 in LLE Psych: Pt's affect is appropriate. Pt is cooperative   Assessment/Plan: 1. 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  Physiatrist is providing close team supervision and 24 hour management of active medical problems listed  below.  Physiatrist and rehab team continue to assess barriers to discharge/monitor patient progress toward functional and medical goals  Care  Tool:  Bathing  Bathing activity did not occur: Safety/medical concerns Body parts bathed by patient: Chest, Abdomen, Front perineal area   Body parts bathed by helper: Right arm, Left arm, Chest, Abdomen, Front perineal area, Buttocks, Right upper leg, Left upper leg, Right lower leg, Left lower leg, Face Body parts n/a: Right arm, Left arm, Buttocks, Right upper leg, Left upper leg, Right lower leg, Left lower leg, Face   Bathing assist Assist Level: Total Assistance - Patient < 25%     Upper Body Dressing/Undressing Upper body dressing   What is the patient wearing?: Pull over shirt    Upper body assist Assist Level: Total Assistance - Patient < 25%    Lower Body Dressing/Undressing Lower body dressing      What is the patient wearing?: Incontinence brief, Pants     Lower body assist Assist for lower body dressing: Total Assistance - Patient < 25%     Toileting Toileting Toileting Activity did not occur Landscape architect and hygiene only): N/A (no void or bm)  Toileting assist Assist for toileting: 2 Helpers     Transfers Chair/bed transfer  Transfers assist     Chair/bed transfer assist level: 2 Helpers     Locomotion Ambulation   Ambulation assist   Ambulation activity did not occur: Safety/medical concerns          Walk 10 feet activity   Assist  Walk 10 feet activity did not occur: Safety/medical concerns        Walk 50 feet activity   Assist Walk 50 feet with 2 turns activity did not occur: Safety/medical concerns         Walk 150 feet activity   Assist Walk 150 feet activity did not occur: Safety/medical concerns         Walk 10 feet on uneven surface  activity   Assist Walk 10 feet on uneven surfaces activity did not occur: Safety/medical concerns         Wheelchair     Assist Will patient use wheelchair at discharge?: Yes Type of Wheelchair: Power Wheelchair activity did not occur: Safety/medical  concerns  Wheelchair assist level: Supervision/Verbal cueing Max wheelchair distance: 150 feet    Wheelchair 50 feet with 2 turns activity    Assist    Wheelchair 50 feet with 2 turns activity did not occur: Safety/medical concerns   Assist Level: Supervision/Verbal cueing   Wheelchair 150 feet activity     Assist  Wheelchair 150 feet activity did not occur: Safety/medical concerns   Assist Level: Supervision/Verbal cueing   Blood pressure 100/81, pulse 75, temperature (!) 97.5 F (36.4 C), resp. rate 18, height (P) 5\' 10"  (1.778 m), weight (P) 61.5 kg, SpO2 99 %.  Medical Problem List and Plan: 1.  Decreased functional mobility/C4 ASIA B/C myelopathy with neurogenic bowel and bladder and mild spasticity secondary to neurofibromatosis type II with multiple prior surgeries for meningioma neurofibroma resection and most recent resection of dural based spinal tumor 2/69/4854 complicated by CSF leak wound revision.  Antibiotic therapy completed 04/28/2020- parents say 6/14- PICC out             -patient may  shower             -ELOS/Goals: goals min-mod A- 3-4 weeks   -Team conference held 7/6  -Continue CIR 2.  Antithrombotics: -DVT/anticoagulation: Subcutaneous heparin.  Check vascular study 6/23- (-) for DVTs 6/26- change to Lovenox since no renal issues and not allergic             -antiplatelet therapy: n/a 3. Pain Management: Baclofen 10 mg twice daily for spasticity. Well controlled 4. Mood: not on any meds             -antipsychotic agents: n/a 5. Neuropsych: This patient is somewhat capable of making decisions on his own behalf. 6. Skin/Wound Care: no open skin areas- however has an area of irritation near anus- Stage I but TINY- will monitor  7/1: Added Eucerin cream for dry feet. 7. Fluids/Electrolytes/Nutrition: regular diet with thin liquids- need SLP for dysarthria/dysphagia and cognition 8.  Neurogenic bowel and bladder.  Suppository nightly as needed,  MiraLAX daily.  Check PVR's  6/23- PVRs ordered, but don't see recorded in chart- will order again, since need to know if pt retaining- having many incontinent stools- will d/w pt/parents about putting on bowel program.   6/24- d/w pt and father about bowel program - 2nd time saw pt- discussed at length. They will think about it.  6/25- will start bowel program per pt request.   6/26- went well last night- con't regimen  6/27- responding well to bowel program; needing occ I/o caths as well per nursing  6/29- needs to do bowel program- refused las tnight since thought getting control- explained if can go in bedpan, that's control- if not, he's just having accidents.   6/30- d/w pt- he's willing again to try bowel program  7/5: Had BM with bowel program yesterday 9.  Seizure disorder.  Keppra 1000 mg twice daily, Inderal 10 mg twice daily. Seizure precautions.   6/30- pt had episode of lethargy/sedation- couldn't wake him up, even with sternal rub, was documented- will consult Neurology- due to hx of seizures- and also order labs- to make sure no infection- recent hx of menigitis  7/1: Discussed with patient that EEG and labs were stable yesterday, with the exception of leukocytosis.  7/6: Repeat seizure episode started yesterday. Neurology consulted. Currently receiving 24 hour EEG and then will receive MRI. Unable to swallow medications at this time.   7/7: MRI today. Unresponsive episode over and patient is now bright. EEG shows evidence of cerebral dysfunction in the left frontal region and a mild diffuse encephalopathy. No epileptiform discharges or EEG seizures were recorded. 10.  Hyponatremia.  Continue sodium chloride tablets 3 times daily.  Follow-up chemistries- last Na 136 per father  6/23- Na 136- con't salt tabs 11. Neurofibromatosis-2- was getting steroids- per Dr Prince Rome can reduce by 20% every few days;  -will also try and contact Dr Shaaron Adler about possible additional chemotherapy- Dr  Prince Rome was CLEAR can use estim, since not cancerous dx.   6/23- will wean Decadron to 4 mg q12 hours from q8 hours for 2 days, then con't to wean.  6/25- decrease steroids due to 2mg  q12 hours today   6/27- will decrease to 2 mg daily. In 3 days, will stop, if tolerating well, esp since got thrush from steroids.   7/2: discontinued Decadron- course completed 12. Impaired function             -will order grey call bell for pt.  13. Hyperkalemia  6/23- K+ 5.7- hemolyzed- will recheck in AM  6/24- K= 4.3 14. Thrush  6/26- add diflucan 200 mg x1 then 100 mg daily x 6 days then nystatin S&S QID x 5 days.  6/29-  thrush gone from oropharynx - off steroids which should prevent recurrence  15. Dysphagia: continue dysphagia 3 diet and SLP treatment.  6/29- needs to get meds crushed in pudding/applesauce  16. ABLA: Hgb 11.7 on 7/5 17. Leukocytosis: WBC 8.9 on 7/5 18. Hypotensive: Stop Lisinopril: BP drops easily- do not add hypertensives.   7/7: BP remains low.      LOS: 15 days A FACE TO FACE EVALUATION WAS PERFORMED  Cameron Hale 05/24/2020, 1:19 PM

## 2020-05-24 NOTE — Progress Notes (Signed)
LTM Discontinued; no new skin breakdown was seen; still has breakdown at Fp1 and F7 as previously noted. Notified [facility] of D/C.

## 2020-05-24 NOTE — Progress Notes (Signed)
Occupational Therapy Session Note ° °Patient Details  °Name: Cameron Hale °[name]: [id number] °Date of Birth: 07/14/1976 ° °Today's Date: 05/24/2020 °OT Individual Time: 0815-0909 °OT Individual Time Calculation (min): 54 min  ° ° °Short Term Goals: °Week 2:  OT Short Term Goal 1 (Week 2): Pt will perform UB self care with max A. °OT Short Term Goal 2 (Week 2): Pt will maintain static sitting balance with min A on EOB for 3 minutes. °OT Short Term Goal 3 (Week 2): Pt will utilized AE and modifications as needed for self feeding with mod A. ° °Skilled Therapeutic Interventions/Progress Updates:  °   Session cleared by PA, reporting bedrest orders are lifted. Pt received supine, still on continuous EEG, agreeable to get OOB. Pt rolled R and L with max A to don new pants and check brief for incontinence. Pt completed sidelying to sit transfer with +2 assist. Pt required max A for sitting balance. Slideboard placed and pt completed lateral scoot transfer with max +2 assist into TIS w/c (chosen for nursing staff to have increased ease of transfer back to bed with pending MRI). Pt was set up with dorsal wrist splint and utensil inserted into palmar slot. Pt able to self-feed breakfast with mod facilitation required at the wrist to pronate enough to stab food. Pt then able to bring fork to mouth with 90% accuracy. Pt was left sitting up with all needs met, mother present.  ° °Therapy Documentation °Precautions:  °Precautions °Precautions: Fall °Precaution Comments: seizure precautions °Restrictions °Weight Bearing Restrictions: No ° ° °Therapy/Group: Individual Therapy ° °Sandra H Davis °05/24/2020, 9:54 AM °

## 2020-05-24 NOTE — Progress Notes (Signed)
Speech Language Pathology Daily Session Note  Patient Details  Name: ATUL DELUCIA [name]: [id number] Date of Birth: 10-30-1976  Today's Date: 05/24/2020 SLP Individual Time: 5872-7618 SLP Individual Time Calculation (min): 25 min  Short Term Goals: Week 2: SLP Short Term Goal 1 (Week 2): Patient will consume current diet with minimal overt s/s of aspiration with Mod I. SLP Short Term Goal 2 (Week 2): Patient will demonstrate efficient mastication with trials of regular textures with complete oral clearance without overt s/s of aspiration over 2 sessions prior to upgrade with supervision verbal cues. SLP Short Term Goal 3 (Week 2): Patient will utilize diaphramatic breathing at the phrase level with Mod A verbal cues to achieve ~75% intelligibility. SLP Short Term Goal 4 (Week 2): Patient will perform 25 repetitions of EMST at 7 cm H2O with Max A multimodal cues with a self-perceived effort level of 7/10 without reports of dizziness, etc. SLP Short Term Goal 5 (Week 2): Patient will perform 25 repetitions of IMST at 13 cm H2O with Max A multimodal cues with a self-perceived effort level of 7/10 without reports of dizziness, etc.  Skilled Therapeutic Interventions: Skilled treatment session focused on speech goals. SLP facilitated session by providing supervision level verbal cues for patient to perform 25 repetitions of IMST and EMST with a self-perceived effort level of 8/10. Patient demonstrated improved vocal intensity today and was 100% intelligible at the phrase and sentence level with Mod I. Patient left upright in the wheelchair with alarm on and all needs within reach. Continue with current plan of care.      Pain No/Denies Pain   Therapy/Group: Individual Therapy  Jaleesa Cervi 05/24/2020, 3:01 PM

## 2020-05-24 NOTE — Progress Notes (Signed)
Patient more alert today. Responding appropriately. FAMily in to visit, updated. MRI completed this morning. EEG discontinued. No BM this shift- suppos given as ordered.

## 2020-05-24 NOTE — Procedures (Signed)
CPT/Type of Study: 59563; 24hr EEG with video Recording date: 05/23/20 09:24 - 05/24/20 07:30 Interpreting Physician: Izora Ribas, DO  History: This is a 44 year old patient, undergoing an EEG to evaluate for seizures.   Technical Description: The EEG was performed using standard setting per the guidelines of American Clinical Neurophysiology Society (ACNS).   A minimum of 21 electrodes were placed on scalp according to the International 10-20 or/and 10-10 Systems. Supplemental electrodes were placed as needed. Single EKG electrode was also used to detect cardiac arrhythmia. Patient's behavior was continuously recorded on video simultaneously with EEG. A minimum of 16 channels were used for data display. Each epoch of study was reviewed manually daily and as needed using standard referential and bipolar montages. Computerized quantitative EEG analysis (such as compressed spectral array analysis, trending, automated spike & seizure detection) were used as indicated.   Clinical State: Awake and asleep Background: The posterior dominant rhythm was recorded at a rate of 8Hz , symmetric, waxing and waning, and reduced by eye opening Overall Amplitude: Normal Predominant Frequency: Alpha  Asymmetry: Yes Sleep background: Stage II  Abnormalities: Intermittent slow left frontal and generalized  Rhythmic or periodic pattern: None  Epileptiform activity: No Electrographic Seizure: No Events: No  Breach rhythm: No Reactivity: Yes  Stimulation procedures:  Hyperventilation: Not done Photic stimulation: Not done  Impression:  -slow left frontal and generalized This EEG shows evidence of cerebral dysfunction in the left frontal region and a mild diffuse encephalopathy. No epileptiform discharges or EEG seizures were recorded.

## 2020-05-24 NOTE — Progress Notes (Signed)
Physical Therapy Session Note  Patient Details  Name: Cameron Hale [name]: [id number] Date of Birth: [birth year]  Today's Date: 05/24/2020 PT Individual Time: 1135-1205 PT Individual Time Calculation (min): 30 min   Short Term Goals: Week 2:  PT Short Term Goal 1 (Week 2): Pt will perform bed mobility with max A x1 consistently. PT Short Term Goal 2 (Week 2): Pt will maintain static sitting balance x10 minutes with mod A. PT Short Term Goal 3 (Week 2): Pt will drive 170 feet using power wheelchair with close supervision.  Skilled Therapeutic Interventions/Progress Updates: Pt presented in bed agreeable to therapy. Pt denies pain during session. Performed bed mobility maxA x1 to EOB. Pt set up with SB total A and performed SB transfer to power chair maxA x 1 with +1 for safety. Pt able to drive power chair to day room with supervision after PTA positioned power chair to face door due to proximity of chair to bed. PTA demonstrated adjustement of controls to tilt and recliner to demonstrate pressure release and possiblity for positioning to allow for cath. PTA then performed hamstring and heel cord stretching 1 min x 3 bilaterally. Pt then drove chair back to room with supervision and was able to maneuver chair between sink and bed!Marland Kitchen PTA then reclined chair back in possibly position to cath with nsg indicating should be possible to cath without returning to bed. Pt's wife also present in room to observe tilting/reclining features of chair. Pt returned to upright position and left with seat belt on and neuro PA present.      Therapy Documentation Precautions:  Precautions Precautions: Fall Precaution Comments: seizure precautions Restrictions Weight Bearing Restrictions: No    Therapy/Group: Individual Therapy  Sony Schlarb  Cecile Gillispie, PTA  05/24/2020, 2:54 PM

## 2020-05-25 ENCOUNTER — Inpatient Hospital Stay (HOSPITAL_COMMUNITY): Payer: Medicare Other | Admitting: Physical Therapy

## 2020-05-25 ENCOUNTER — Inpatient Hospital Stay (HOSPITAL_COMMUNITY): Payer: Medicare Other | Admitting: *Deleted

## 2020-05-25 ENCOUNTER — Inpatient Hospital Stay (HOSPITAL_COMMUNITY): Payer: Medicare Other | Admitting: Speech Pathology

## 2020-05-25 ENCOUNTER — Inpatient Hospital Stay (HOSPITAL_COMMUNITY): Payer: Medicare Other | Admitting: Occupational Therapy

## 2020-05-25 LAB — URINE CULTURE: Culture: >=100000 — AB

## 2020-05-25 NOTE — Progress Notes (Signed)
Physical Therapy Session Note  Patient Details  Name: Cameron Hale [name]: [id number] Date of Birth: Oct 16, 1976  Today's Date: 05/25/2020 PT Individual Time: 1135-1206 PT Individual Time Calculation (min): 31 min   Short Term Goals: Week 2:  PT Short Term Goal 1 (Week 2): Pt will perform bed mobility with max A x1 consistently. PT Short Term Goal 2 (Week 2): Pt will maintain static sitting balance x10 minutes with mod A. PT Short Term Goal 3 (Week 2): Pt will drive 409 feet using power wheelchair with close supervision.  Skilled Therapeutic Interventions/Progress Updates:    Pt received sitting in power w/c with his father, Konrad Dolores, present and pt agreeable to therapy session. Power w/c mobility out of room and ~121ft down the hall with close supervision but had to stop multiple times to reposition R hand on goal-post joy stick as pt had difficulty trying to position fingers over to allow increased control - distance of w/c mobility limited by R UE fatigue. Therapist transported pt remainder of distance to to day room. Reinforced education regarding importance of tilting power wheelchair back completely for pressure relief for 1 minute every 30 minutes - therapist had to assist pt with changing settings on w/c to tilt feature and due to R UE weakness once tilted back required assist to manage joy stick control due to difficulty moving arm against gravity. R lateral scoot w/c>EOM - total assist for trunk control and board placement - total assist for trunk control and scooting with +2 assist for stabilizing board and assisting with scooting hips as needed. Sitting balance EOM for ~59minutes with pt requiring total assist to maintain upright once LOB occurred - demonstrates R anterolateral lean bias - cuing and manual facilitation for L lateral weight shift onto pelvis - by end able to maintain midline for ~10seconds before anterior or posterior LOB once assisted to midline. L lateral scoot EOM>w/c total  assist for board placement and max/total assist for R lateral trunk lean while board being placed, total assist for trunk control and scooting with +2 assist stabilizing board and assisting to scoot as needed. Total assist power w/c mobility back to room for time management. Therapist set w/c in tilt mode and educated pt's father on how to assist pt with tilting backwards for pressure relief every 30 minutes - emphasis on importance of turning off power wheelchair when just sitting for safety. Left with needs in reach tilted back in TIS w/c with his father present.  Therapy Documentation Precautions:  Precautions Precautions: Fall Precaution Comments: seizure precautions Restrictions Weight Bearing Restrictions: No  Pain: No reports of pain throughout session.   Therapy/Group: Individual Therapy  Tawana Scale , PT, DPT, CSRS  05/25/2020, 8:00 AM

## 2020-05-25 NOTE — Progress Notes (Signed)
Physical Therapy Session Note  Patient Details  Name: EL PILE [name]: [id number] Date of Birth: [birth year]  Today's Date: 05/25/2020 PT Individual Time: 1302-1330 PT Individual Time Calculation (min): 28 min   Short Term Goals: Week 2:  PT Short Term Goal 1 (Week 2): Pt will perform bed mobility with max A x1 consistently. PT Short Term Goal 2 (Week 2): Pt will maintain static sitting balance x10 minutes with mod A. PT Short Term Goal 3 (Week 2): Pt will drive 536 feet using power wheelchair with close supervision.  Skilled Therapeutic Interventions/Progress Updates:    Patient received in TIS WC, pleasant and willing to participate in PT; father present and demonstrated correct ability to use controls to tilt power WC. Patient able to perform sliding board transfer back to bed with MaxAx2, then required [place] for return to supine and repositioning in bed. Otherwise worked on stretching and ROM of B ankles, knees, and hips from bed level to patient tolerance. Left positioned to comfort in bed with all needs met this afternoon.   Therapy Documentation Precautions:  Precautions Precautions: Fall Precaution Comments: seizure precautions Restrictions Weight Bearing Restrictions: No Pain: Pain Assessment Pain Scale: Faces Faces Pain Scale: Hurts a little bit Pain Type: Acute pain Pain Location: Shoulder Pain Orientation: Right Pain Descriptors / Indicators: Aching;Sore Pain Onset: On-going Patients Stated Pain Goal: 0 Pain Intervention(s): Repositioned    Therapy/Group: Individual Therapy  Windell Norfolk, DPT, PN1   Supplemental Physical Therapist [place]    Pager [phone] Acute Rehab Office [phone]   05/25/2020, 3:37 PM

## 2020-05-25 NOTE — Progress Notes (Signed)
[facility] PHYSICAL MEDICINE & REHABILITATION PROGRESS NOTE   Subjective/Complaints: Appears more lethargic today, but still alert and responsive MRI Brain shows increased meningiomas and vasogenic edema- discussed results with mother and patient. Appreciate neurology follow-up by Dr. Leonel Ramsay- discussed with mother and patient that partial seizures with prolonged postictal state may be cause of unresponsive episodes.   ROS: Denies pain, constipation, insomnia.   Objective:   MR BRAIN W WO CONTRAST  Result Date: 05/24/2020 CLINICAL DATA:  44 year old male with type 2 neurofibromatosis, Meningiomatosis, vestibular schwannoma. Multiple prior meningioma resections. Prior gamma knife. Seizure disorder. EXAM: MRI HEAD WITHOUT AND WITH CONTRAST TECHNIQUE: Multiplanar, multiecho pulse sequences of the brain and surrounding structures were obtained without and with intravenous contrast. CONTRAST:  56mL GADAVIST GADOBUTROL 1 MMOL/ML IV SOLN COMPARISON:  [facility] Brain MRI and cervical spine MRI 01/26/2020 and earlier. FINDINGS: Brain: No restricted diffusion or evidence of acute infarction. No ventriculomegaly. Basilar cistern patency is stable, including partial effacement of the prepontine cistern on the left. No acute intracranial hemorrhage identified. Occasional scattered chronic blood products appears stable on SWI. Chronic Meningiomatosis. The dominant central para falcine meningioma is less enhancing compared to prior studies. And overall size is slightly smaller (from 42 to 39 mm diameter since November). Extensive en plaque type meningiomas along both superior convexities redemonstrated. Those in the midline and over the right convexity appears stable since 2020, but that over the left convexity on series 13, image 16 today has increased and is now up to 11 mm in thickness (versus 9-10 mm in March and 7-8 mm in November). Anterior bifrontal en plaque  meningioma involvement also has mildly progressed since November, with up to 10 mm bilateral meningioma thickness now (series 14, image 9 on the left) versus 7 mm or less in November. 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 Subsequently, confluent bilateral frontal lobe vasogenic edema has mildly increased since March, including adjacent to the left frontal horn and at the anterior corpus callosum on series 6, image 15 today (compare to series 8, image 29 and March). On thin slice coronal T2 images the right hippocampal formation appears slightly larger and more T2 hyperintense compared to the left (series 9, image 16) which may be new since priors. Vascular: Major intracranial vascular flow voids are stable. 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 The torcula, transverse and sigmoid sinuses are enhancing and appear to remain patent. Skull and upper cervical spine: Chronic nodular spinal cord enhancement at C1-C2 appears stable since March. Stable visible bone marrow signal. Sinuses/Orbits: There appears to be new herniation of the bilateral gyrus rectus into the sella turcica (series 9, image 28). But no associated parenchymal edema. The pituitary has been chronically difficult to delineate from the infiltrative cavernous sinus meningioma, and is also not clearly identified today. Subsequent mild mass effect suspected on the pre chiasmatic optic nerves, but intraorbital soft tissues otherwise appear stable. Paranasal sinuses and mastoids are stable and well pneumatized. Other: Stable enhancing right vestibular schwannoma relatively sparing the cerebellopontine angle (series 12, image 16). No acute scalp soft tissue finding identified. IMPRESSION: 1. Extensive Meningiomatosis in keeping with NF-2. Slowly enlarging en plaque type meningiomas along both anterior and superior cerebral convexities. Associated mildly increased vasogenic edema in the  frontal lobes and anterior corpus callosum since March, greater on the left. And partial herniation of the posterior rectus gyri into the sella turcica (series 9, image 28), new  since November. Meanwhile, the dominant central para falcine meningioma is less enhancing and appears slightly smaller since November. 2. Questionable asymmetric T2 hyperintensity and enlargement of the right hippocampus. 3. 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 4. Negative for acute infarct. Electronically Signed   By: Genevie Ann M.D.   On: 05/24/2020 13:57   Recent Labs    05/22/20 1933  WBC 8.9  HGB 11.7*  HCT 35.9*  PLT 168   Recent Labs    05/22/20 1933  NA 139  K 3.9  CL 104  CO2 24  GLUCOSE 85  BUN 15  CREATININE <0.30*  CALCIUM 8.6*    Intake/Output Summary (Last 24 hours) at 05/25/2020 1015 Last data filed at 05/25/2020 0600 Gross per 24 hour  Intake 740 ml  Output 400 ml  Net 340 ml     Physical Exam: Vital Signs Blood pressure 130/90, pulse 79, temperature 98 F (36.7 C), temperature source Oral, resp. rate 18, height (P) 5\' 10"  (1.778 m), weight (P) 61.5 kg, SpO2 99 %. General: Alert and oriented x 2, No apparent distress HEENT: Head is normocephalic, atraumatic, PERRLA, EOMI, sclera anicteric, oral mucosa pink and moist, dentition intact, ext ear canals clear,  Neck: Supple without JVD or lymphadenopathy Heart: Reg rate and rhythm. No murmurs rubs or gallops Chest: CTA bilaterally without wheezes, rales, or rhonchi; no distress Abdomen: Soft, non-tender, non-distended, bowel sounds positive. Extremities: No clubbing, cyanosis, or edema. Pulses are 2+ Skin: Clean and intact without signs of breakdown Neuro: RUE- biceps 4-/5, WE 2-/5, triceps 2/5, grip 2-/5, finger abd 2/5 LUE- biceps 3/5, WE 2-/5, triceps 2-/5, grip 1/5, finger abd 1/5 RLE- HF, KE, DF, PF all 1/5 LLE- HF 2-/5; otherwise 0/5 in LLE Psych: Pt's affect is  appropriate. Pt is cooperative  Assessment/Plan: 1. 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  Physiatrist is providing close team supervision and 24 hour management of active medical problems listed below.  Physiatrist and rehab team continue to assess barriers to discharge/monitor patient progress toward functional and medical goals  Care Tool:  Bathing  Bathing activity did not occur: Safety/medical concerns Body parts bathed by patient: Chest, Abdomen, Front perineal area   Body parts bathed by helper: Right arm, Left arm, Chest, Abdomen, Front perineal area, Buttocks, Right upper leg, Left upper leg, Right lower leg, Left lower leg, Face Body parts n/a: Right arm, Left arm, Buttocks, Right upper leg, Left upper leg, Right lower leg, Left lower leg, Face   Bathing assist Assist Level: Total Assistance - Patient < 25%     Upper Body Dressing/Undressing Upper body dressing   What is the patient wearing?: Pull over shirt    Upper body assist Assist Level: Total Assistance - Patient < 25%    Lower Body Dressing/Undressing Lower body dressing      What is the patient wearing?: Pants     Lower body assist Assist for lower body dressing: Total Assistance - Patient < 25%     Toileting Toileting Toileting Activity did not occur Landscape architect and hygiene only): N/A (no void or bm)  Toileting assist Assist for toileting: 2 Helpers     Transfers Chair/bed transfer  Transfers assist     Chair/bed transfer assist level: 2 Helpers     Locomotion Ambulation   Ambulation assist   Ambulation activity did  not occur: Safety/medical concerns          Walk 10 feet activity   Assist  Walk 10 feet activity did not occur: Safety/medical concerns        Walk 50 feet activity   Assist Walk 50 feet with 2 turns  activity did not occur: Safety/medical concerns         Walk 150 feet activity   Assist Walk 150 feet activity did not occur: Safety/medical concerns         Walk 10 feet on uneven surface  activity   Assist Walk 10 feet on uneven surfaces activity did not occur: Safety/medical concerns         Wheelchair     Assist Will patient use wheelchair at discharge?: Yes Type of Wheelchair: Power Wheelchair activity did not occur: Safety/medical concerns  Wheelchair assist level: Supervision/Verbal cueing Max wheelchair distance: 150 feet    Wheelchair 50 feet with 2 turns activity    Assist    Wheelchair 50 feet with 2 turns activity did not occur: Safety/medical concerns   Assist Level: Supervision/Verbal cueing   Wheelchair 150 feet activity     Assist  Wheelchair 150 feet activity did not occur: Safety/medical concerns   Assist Level: Supervision/Verbal cueing   Blood pressure 130/90, pulse 79, temperature 98 F (36.7 C), temperature source Oral, resp. rate 18, height (P) 5\' 10"  (1.778 m), weight (P) 61.5 kg, SpO2 99 %.  Medical Problem List and Plan: 1.  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  Antibiotic therapy completed 04/28/2020- parents say 6/14- PICC out             -patient may  shower             -ELOS/Goals: goals min-mod A- 3-4 weeks   -Team conference held 7/6  -Continue CIR 2.  Antithrombotics: -DVT/anticoagulation: Subcutaneous heparin.  Check vascular study 6/23- (-) for DVTs 6/26- change to Lovenox since no renal issues and not allergic             -antiplatelet therapy: n/a 3. Pain Management: Baclofen 10 mg twice daily for spasticity. Well controlled 4.  Mood: not on any meds             -antipsychotic agents: n/a 5. Neuropsych: This patient is somewhat capable of making decisions on his own behalf. 6. Skin/Wound Care: no open skin areas- however has an area of irritation near anus- Stage I but TINY- will monitor  7/1: Added Eucerin cream for dry feet. 7. Fluids/Electrolytes/Nutrition: regular diet with thin liquids- need SLP for dysarthria/dysphagia and cognition 8.  Neurogenic bowel and bladder.  Suppository nightly as needed, MiraLAX daily.  Check PVR's  6/23- PVRs ordered, but don't see recorded in chart- will order again, since need to know if pt retaining- having many incontinent stools- will d/w pt/parents about putting on bowel program.   6/24- d/w pt and father about bowel program - 2nd time saw pt- discussed at length. They will think about it.  6/25- will start bowel program per pt request.   6/26- went well last night- con't regimen  6/27- responding well to bowel program; needing occ I/o caths as well per nursing  6/29- needs to do bowel program- refused las tnight since thought getting control-  explained if can go in bedpan, that's control- if not, he's just having accidents.   6/30- d/w pt- he's willing again to try bowel program  7/5: Had BM with bowel program yesterday 9.  Seizure disorder.  Keppra 1000 mg twice daily, Inderal 10 mg twice daily. Seizure precautions.   6/30- pt had episode of lethargy/sedation- couldn't wake him up, even with sternal rub, was documented- will consult Neurology- due to hx of seizures- and also order labs- to make sure no infection- recent hx of menigitis  7/1: Discussed with patient that EEG and labs were stable yesterday, with the exception of leukocytosis.  7/6: Repeat seizure episode started yesterday. Neurology consulted. Currently receiving 24 hour EEG and then will receive MRI. Unable to swallow medications at this time.   7/7: MRI today. Unresponsive episode over and patient is now bright.  EEG shows evidence of cerebral dysfunction in the left frontal region and a mild diffuse encephalopathy. No epileptiform discharges or EEG seizures were recorded.  7/8: MRI shows increase in size of meningiomas and vasogenic edema- discussed with patient and his mother. Appreciate neurology follow-up. Dr. Leonel Ramsay thinks partial seizures with prolonged postictal state may be cause of unresponsive episodes- discussed this with patient and his mother. She will schedule him follow-up appointments with his neurologist and oncologist at [facility] when he leaves here.  10.  Hyponatremia.  Continue sodium chloride tablets 3 times daily.  Follow-up chemistries- last Na 136 per father  6/23- Na 136- con't salt tabs 11. Neurofibromatosis-2- was getting steroids- per Dr Prince Rome can reduce by 20% every few days;  -will also try and contact Dr Shaaron Adler about possible additional chemotherapy- Dr Prince Rome was CLEAR can use estim, since not cancerous dx.   6/23- will wean Decadron to 4 mg q12 hours from q8 hours for 2 days, then con't to wean.  6/25- decrease steroids due to 2mg  q12 hours today   6/27- will decrease to 2 mg daily. In 3 days, will stop, if tolerating well, esp since got thrush from steroids.   7/2: discontinued Decadron- course completed 12. Impaired function             -will order grey call bell for pt.  13. Hyperkalemia  6/23- K+ 5.7- hemolyzed- will recheck in AM  6/24- K= 4.3 14. Thrush  6/26- add diflucan 200 mg x1 then 100 mg daily x 6 days then nystatin S&S QID x 5 days.  6/29- thrush gone from oropharynx - off steroids which should prevent recurrence  15. Dysphagia: continue dysphagia 3 diet and SLP treatment.  6/29- needs to get meds crushed in pudding/applesauce  16. ABLA: Hgb 11.7 on 7/5 17. Leukocytosis: WBC 8.9 on 7/5 18. Hypotensive: Stop Lisinopril: BP drops easily- do not add hypertensives.   7/7: BP remains low.  19. Disposition:   7/8: Plan for discharge home with parents  post-CIR. Family training tomorrow. Advised to talk with A[facility] tomorrow about DME and home health services. Parents will be able to hire additional assistance if needed a couple of times per week.  >35 minutes spent in discussion with patient and his mother about MRI findings and plan of care, review of neurology note, discussion of post-CIR care, DME, and home health services.      LOS: 16 days A FACE TO FACE EVALUATION WAS PERFORMED  Natalina Wieting P Yocelin Vanlue 05/25/2020, 10:15 AM

## 2020-05-25 NOTE — Progress Notes (Signed)
Occupational Therapy Session Note  Patient Details  Name: Cameron Hale [name]: [id number] Date of Birth: 06-Jan-1976  Today's Date: 05/25/2020 OT Individual Time: 3754-3606 OT Individual Time Calculation (min): 70 min    Short Term Goals: Week 3:  OT Short Term Goal 1 (Week 3): Pt will perform UB self care with max A. OT Short Term Goal 2 (Week 3): Pt will perform SB transfer with max A  Skilled Therapeutic Interventions/Progress Updates:    PT resting in bed upon arrival.  OT intervention with initial focus on bed mobiity to facilitate LB dressing and prepare for SB transfer to [place]. Rolling R/L in bed with max A using bed rails.  Pt requires assistance placing UE on bed rails to maintain sidelying position to facilitate therapist pulling pants over hips. Sidelying>sit EOB with tot A. SB transfer to w/c with tot A+2. Focus shifted to self feeding with AE and adapted utensil. Pt requires mod A with tactile facilitation for pronation to scoop food. Pt with difficulty extending R elbow without assistance. Pt remained in w/c with RN present assisting with meal.    Therapy Documentation Precautions:  Precautions Precautions: Fall Precaution Comments: seizure precautions Restrictions Weight Bearing Restrictions: No Pain: Pt c/o HA (unrated); RN notified and meds admin during session   Therapy/Group: Individual Therapy  Leroy Libman 05/25/2020, 9:44 AM

## 2020-05-25 NOTE — Progress Notes (Signed)
Speech Language Pathology Weekly Progress and Session Note  Patient Details  Name: Cameron Hale [name]: [id number] Date of Birth: 02/07/1976  Beginning of progress report period: May 18, 2020 End of progress report period: May 25, 2020  Today's Date: 05/25/2020 SLP Individual Time: 1030-1055 SLP Individual Time Calculation (min): 25 min  Short Term Goals: Week 2: SLP Short Term Goal 1 (Week 2): Patient will consume current diet with minimal overt s/s of aspiration with Mod I. SLP Short Term Goal 1 - Progress (Week 2): Met SLP Short Term Goal 2 (Week 2): Patient will demonstrate efficient mastication with trials of regular textures with complete oral clearance without overt s/s of aspiration over 2 sessions prior to upgrade with supervision verbal cues. SLP Short Term Goal 2 - Progress (Week 2): Not met SLP Short Term Goal 3 (Week 2): Patient will utilize diaphramatic breathing at the phrase level with Mod A verbal cues to achieve ~75% intelligibility. SLP Short Term Goal 3 - Progress (Week 2): Met SLP Short Term Goal 4 (Week 2): Patient will perform 25 repetitions of EMST at 7 cm H2O with Max A multimodal cues with a self-perceived effort level of 7/10 without reports of dizziness, etc. SLP Short Term Goal 4 - Progress (Week 2): Not met SLP Short Term Goal 5 (Week 2): Patient will perform 25 repetitions of IMST at 13 cm H2O with Max A multimodal cues with a self-perceived effort level of 7/10 without reports of dizziness, etc. SLP Short Term Goal 5 - Progress (Week 2): Met    New Short Term Goals: Week 3: SLP Short Term Goal 1 (Week 3): Patient will demonstrate efficient mastication with trials of regular textures with complete oral clearance without overt s/s of aspiration over 2 sessions prior to upgrade with supervision verbal cues. SLP Short Term Goal 2 (Week 3): Patient will utilize diaphramatic breathing at the phrase level with Min A verbal cues to achieve ~90% intelligibility. SLP  Short Term Goal 3 (Week 3): Patient will perform 25 repetitions of EMST at 7 cm H2O with Max A multimodal cues with a self-perceived effort level of 7/10 without reports of dizziness, etc. SLP Short Term Goal 4 (Week 3): Patient will perform 25 repetitions of IMST at 15 cm H2O with Max A multimodal cues with a self-perceived effort level of 7/10 without reports of dizziness, etc.  Weekly Progress Updates: Patient continues to make functional gains and has met 3 of 5 STGs this reporting period. Currently, patient remains on Dys. 3 textures with thin liquids with minimal overt s/s of aspiration but continues to demonstrate prolonged mastication. However, trials of regular textures will be attempted this upcoming week. Patient demonstrates improved use of diaphragmatic breathing resulting in increased vocal intensity and overall speech intelligibility at the phrase level. Patient is performing 35 repetitions IMST and EMST exercises with intermittent efficiency and accuracy which appears to be dependent on fatigue and lethargy. Patient and family education ongoing. Patient would benefit from continued skilled SLP intervention to maximize his swallowing and speech function prior to discharge.     Intensity: Minumum of 1-2 x/day, 30 to 90 minutes Frequency: 3 to 5 out of 7 days Duration/Length of Stay: 06/07/20 Treatment/Interventions: Dysphagia/aspiration precaution training;Speech/Language facilitation;Therapeutic Activities;Environmental controls;Cueing hierarchy;Functional tasks;Patient/family education   Daily Session  Skilled Therapeutic Interventions:  Skilled treatment session focused on communication goals. SLP faciliated session by re-administering RMT testing. However, due to fatigue, patient unable to follow directions to complete testing accurately, therefore, peak MIP and MEP  would have been unreliable. However, suspect RMT has been assisting in maximizing patient's breath support indicated by  patient's vocal intensity to achieve ~90% intelligibility at the phrase level which appears RMT. Patient also require more than a reasonable amount of time to initiate EMST exercises, therefore, he could only perform 15 repetitions. Patient left upright in the chair with alarm on and all needs within reach. Continue with current plan of care.      Pain Pain Assessment Pain Score: 0-No pain  Therapy/Group: Individual Therapy  Lennette Fader 05/25/2020, 6:39 AM

## 2020-05-25 NOTE — Plan of Care (Signed)
°  Problem: Consults Goal: RH SPINAL CORD INJURY PATIENT EDUCATION Description:  See Patient Education module for education specifics.  Outcome: Progressing Goal: Skin Care Protocol Initiated - if Braden Score 18 or less Description: If consults are not indicated, leave blank or document N/A Outcome: Progressing   Problem: SCI BOWEL ELIMINATION Goal: RH STG MANAGE BOWEL WITH ASSISTANCE Description: STG Manage Bowel with max to total Assistance. Outcome: Progressing Goal: RH STG SCI MANAGE BOWEL WITH MEDICATION WITH ASSISTANCE Description: STG SCI Manage bowel with medication with max to total assistance. Outcome: Progressing Goal: RH STG SCI MANAGE BOWEL PROGRAM W/ASSIST OR AS APPROPRIATE Description: STG SCI Manage bowel program with max to total assist or as appropriate. Outcome: Progressing   Problem: SCI BLADDER ELIMINATION Goal: RH STG MANAGE BLADDER WITH ASSISTANCE Description: STG Manage Bladder With max Assistance Outcome: Progressing Goal: RH STG SCI MANAGE BLADDER PROGRAM W/ASSISTANCE Description: Manage bladder program with max assistance. Outcome: Progressing   Problem: RH SKIN INTEGRITY Goal: RH STG SKIN FREE OF INFECTION/BREAKDOWN Description: Skin to remain free from breakdown while on rehab with mod assistance. Outcome: Progressing Goal: RH STG MAINTAIN SKIN INTEGRITY WITH ASSISTANCE Description: STG Maintain Skin Integrity With mod Assistance. Outcome: Progressing Goal: RH STG ABLE TO PERFORM INCISION/WOUND CARE W/ASSISTANCE Description: STG Able To Perform Incision/Wound Care With mod Assistance. Outcome: Progressing   Problem: RH SAFETY Goal: RH STG ADHERE TO SAFETY PRECAUTIONS W/ASSISTANCE/DEVICE Description: STG Adhere to Safety Precautions With mod Assistance and appropriate assistive Device. Outcome: Progressing   Problem: RH PAIN MANAGEMENT Goal: RH STG PAIN MANAGED AT OR BELOW PT'S PAIN GOAL Description: <4 on a 0-10 pain scale. Outcome:  Progressing   Problem: RH KNOWLEDGE DEFICIT SCI Goal: RH STG INCREASE KNOWLEDGE OF SELF CARE AFTER SCI Description: Patient and caregiver will be able to demonstrate medication management, bowel program, bladder program, safety precautions, skin care, and follow up care with the MD with min assist from CIR staff. Outcome: Progressing

## 2020-05-25 NOTE — Progress Notes (Signed)
Occupational Therapy Weekly Progress Note  Patient Details  Name: Cameron Hale [name]: [id number] Date of Birth: 09-Jun-1976  Beginning of progress report period: May 16, 2020 End of progress report period: May 25, 2020  Patient has met 2 of 3 short term goals.  Pt progress has been slow and minimal during the past week.  Pt continues to require max A+2 for rolling R/L in bed and supine>sit EOB in preparation for SB tranfser with max A+2. Pt currently requires tot A for dressing at bed level and seated in w/c. Pt is able to self feed with mod A using AE and with tactile facilitation for scooping and stabbing food items. Pt has maintained static sitting balance with min A/CGA for 2 mins but typically requires mod/max A. Pt has experienced some medical complications during the past week and was on bed rest for a 24 hour period.   Patient continues to demonstrate the following deficits: muscle weakness, decreased cardiorespiratoy endurance, impaired timing and sequencing, abnormal tone, unbalanced muscle activation and decreased coordination, decreased initiation, decreased attention, decreased safety awareness, decreased memory and delayed processing and decreased sitting balance, decreased standing balance, decreased postural control and decreased balance strategies and therefore will continue to benefit from skilled OT intervention to enhance overall performance with BADL and Reduce care partner burden.  Patient progressing toward long term goals..  Continue plan of care.  OT Short Term Goals Week 2:  OT Short Term Goal 1 (Week 2): Pt will perform UB self care with max A. OT Short Term Goal 1 - Progress (Week 2): Progressing toward goal OT Short Term Goal 2 (Week 2): Pt will maintain static sitting balance with min A on EOB for 3 minutes. OT Short Term Goal 2 - Progress (Week 2): Progressing toward goal OT Short Term Goal 3 (Week 2): Pt will utilized AE and modifications as needed for self feeding  with mod A. OT Short Term Goal 3 - Progress (Week 2): Met OT Short Term Goal 5 (Week 2): Pt will maintain static sitting balance with min A on EOB for 3 minutes. Week 3:  OT Short Term Goal 1 (Week 3): Pt will perform UB self care with max A. OT Short Term Goal 2 (Week 3): Pt will perform SB transfer with max A     Leotis Shames [facility] 05/25/2020, 6:09 AM

## 2020-05-25 NOTE — Progress Notes (Signed)
Patient's dig stim was started and completed. Reported to night shift nurse. Amanda Cockayne, LPN

## 2020-05-25 NOTE — Progress Notes (Signed)
Occupational Therapy Note  Patient Details  Name: Cameron Hale [name]: [id number] Date of Birth: [birth year]  Today's Date: 05/25/2020 OT Missed Time: 22 Minutes Missed Time Reason: Patient fatigue  Pt missed 30 mins skilled OT services.  Pt asleep upon arrival.  Easily aroused and responded to questions appropriately.  Pt unable to keep eyes open and actively participate secondary to lethargy.  Pt had just returned to bed prior to scheduled therapy session.    Leotis Shames [facility] 05/25/2020, 2:18 PM

## 2020-05-25 NOTE — Progress Notes (Signed)
Patient ID: Cameron Hale, male   DOB: 05/12/1976, 44 y.o.   [name]: [id number]  SW called pt mother [name] [phone]) to discuss discharge plan. Confirms the d/c plan is to d/c to her home and he will have support from her, his father, and the will hire private aide care. Fam edu on Friday; Monday and Thursday 10am-3pm with mother/father. Mother is aware of w/c eval on Monday at 11am with [name].   Address: [id number], [place] [address].   Loralee Pacas, MSW, [place] Office: [phone] Cell: [phone] Fax: [phone]

## 2020-05-26 ENCOUNTER — Encounter (HOSPITAL_COMMUNITY): Payer: Medicare Other | Admitting: Speech Pathology

## 2020-05-26 ENCOUNTER — Inpatient Hospital Stay (HOSPITAL_COMMUNITY): Payer: Medicare Other

## 2020-05-26 ENCOUNTER — Encounter (HOSPITAL_COMMUNITY): Payer: Medicare Other | Admitting: Occupational Therapy

## 2020-05-26 ENCOUNTER — Ambulatory Visit (HOSPITAL_COMMUNITY): Payer: Medicare Other | Admitting: Physical Therapy

## 2020-05-26 NOTE — Progress Notes (Signed)
Physical Therapy Session Note  Patient Details  Name: Cameron Hale [name]: [id number] Date of Birth: 1976/03/08  Today's Date: 05/26/2020 PT Individual Time: 1420-1505 PT Individual Time Calculation (min): 45 min   Short Term Goals: Week 1:  PT Short Term Goal 1 (Week 1): Pt will complete bed mobility with assist x 1 consistently PT Short Term Goal 1 - Progress (Week 1): Progressing toward goal PT Short Term Goal 2 (Week 1): Pt will maintain static sitting balance with mod A x 5 min PT Short Term Goal 2 - Progress (Week 1): Met PT Short Term Goal 3 (Week 1): Pt will initiate w/c mobility as safe and able PT Short Term Goal 3 - Progress (Week 1): Met Week 2:  PT Short Term Goal 1 (Week 2): Pt will perform bed mobility with max A x1 consistently. PT Short Term Goal 1 - Progress (Week 2): Progressing toward goal PT Short Term Goal 2 (Week 2): Pt will maintain static sitting balance x10 minutes with mod A. PT Short Term Goal 2 - Progress (Week 2): Met PT Short Term Goal 3 (Week 2): Pt will drive 284 feet using power wheelchair with close supervision. PT Short Term Goal 3 - Progress (Week 2): Progressing toward goal Week 3:  PT Short Term Goal 1 (Week 3): Pt's family will demonstrate ability to assist patient with transfers with min cueing from therapist PT Short Term Goal 2 (Week 3): Pt will perform power w/c mobility x 150 ft with Supervision PT Short Term Goal 3 (Week 3): Pt will be able to verbalize pressure relief schedule of x 2 min every 30 min  Skilled Therapeutic Interventions/Progress Updates:    PAIN denies pain at this time  Pt initially awake and alert sitting in bed.  Wife and mother in room.  Pt agreeable to session.  Discussed plan for session and pt/family in agreement.   Pt rolled L total assist of 1, side to sit total assist of 1. Pt initially max assist for sitting balance w/feet supported on floor.  Worked on placing and holding trunk at balance point.  Pt able to maintain  for up to 10 sec w/cga. Worked on small cervical movements to alter and return to balance point w/min to max assist. Worked on using UEs/cervical positioning/motion to facilitate wt shifts/corrections of deviations from balance point.  Pt w/very delayed ability to self correct, tactile/visual/verbal cues utilized to facilitate. Repeated above using UE shoulder abd/add and flexion to alter center of mass -min to max assist.   Pt requires mult reclined rest breaks during sitting balance activity.  Pt/family then instructed w/observed SBT set up/sequencing/mechanics of transfer from bed to wc.  Pt total assist of 1, second person to secure board.  Pt noted to have been incontinent of bowels during transfer.  Returned to bed SBT as above.  Sit to supine total assist.  Pt then worked on rolling to L/R mult times each direction w/cues for sequencing, total assist, pt uses UE/s via hooking at elbow to assist w/rolling, dependent for lower body for removal of soiled pants/brief, cleaning/perineal care performed by therapist, clean brief applied by therapist.  NT in to assist w/supine scoot - total assist of 2. Session timed out so transfer not able to be performed during this session.  Pt left supine w/rails up x 4, alarm set, bed in lowest position, and Nurse Tech in room w/pt..   Therapy Documentation Precautions:  Precautions Precautions: Fall Precaution Comments: seizure precautions Restrictions Weight  Bearing Restrictions: No    Therapy/Group: Individual Therapy  Callie Fielding, [place] 05/26/2020, 4:01 PM

## 2020-05-26 NOTE — Progress Notes (Signed)
Pt ate 100% of meal , afterwards digital stimulation was done for 15 seconds with flatus being passed and a smear of soft stool present. Digital stimulation will be completed by the oncoming RN

## 2020-05-26 NOTE — Progress Notes (Signed)
Cameron Hale PHYSICAL MEDICINE & REHABILITATION PROGRESS NOTE   Subjective/Complaints: More lethargic today. Continues with bowel and bladder program. Participated with PT well yesterday without pain. Had discussion with mother yesterday regarding plan of care.   ROS: Patient unable to provide right now given lethargy.   Objective:   No results found. No results for input(s): WBC, HGB, HCT, PLT in the last 72 hours. No results for input(s): NA, K, CL, CO2, GLUCOSE, BUN, CREATININE, CALCIUM in the last 72 hours.  Intake/Output Summary (Last 24 hours) at 05/26/2020 1044 Last data filed at 05/26/2020 0800 Gross per 24 hour  Intake 680 ml  Output --  Net 680 ml     Physical Exam: Vital Signs Blood pressure (!) 141/93, pulse 67, temperature (!) 97.4 F (36.3 C), resp. rate 16, height (P) 5\' 10"  (1.778 m), weight (P) 61.5 kg, SpO2 100 %. General:  No apparent distress. Lethargic HEENT: Head is normocephalic, atraumatic, PERRLA, EOMI, sclera anicteric, oral mucosa pink and moist, dentition intact, ext ear canals clear,  Neck: Supple without JVD or lymphadenopathy Heart: Reg rate and rhythm. No murmurs rubs or gallops Chest: CTA bilaterally without wheezes, rales, or rhonchi; no distress Abdomen: Soft, non-tender, non-distended, bowel sounds positive. Extremities: No clubbing, cyanosis, or edema. Pulses are 2+ Skin: Clean and intact without signs of breakdown Neuro: Unable to tolerate exam today due to lethargy.  Psych: Pt's affect is appropriate. Pt is cooperative   Assessment/Plan: 1. 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  Physiatrist is providing close team supervision and 24 hour management of active medical problems listed below.  Physiatrist and rehab team continue to assess barriers to discharge/monitor patient progress toward  functional and medical goals  Care Tool:  Bathing  Bathing activity did not occur: Safety/medical concerns Body parts bathed by patient: Chest, Abdomen, Front perineal area   Body parts bathed by helper: Right arm, Left arm, Chest, Abdomen, Front perineal area, Buttocks, Right upper leg, Left upper leg, Right lower leg, Left lower leg, Face Body parts n/a: Right arm, Left arm, Buttocks, Right upper leg, Left upper leg, Right lower leg, Left lower leg, Face   Bathing assist Assist Level: Total Assistance - Patient < 25%     Upper Body Dressing/Undressing Upper body dressing   What is the patient wearing?: Pull over shirt    Upper body assist Assist Level: Total Assistance - Patient < 25%    Lower Body Dressing/Undressing Lower body dressing      What is the patient wearing?: Pants     Lower body assist Assist for lower body dressing: Total Assistance - Patient < 25%     Toileting Toileting Toileting Activity did not occur Landscape architect and hygiene only): N/A (no void or bm)  Toileting assist Assist for toileting: 2 Helpers     Transfers Chair/bed transfer  Transfers assist     Chair/bed transfer assist level: 2 Helpers     Locomotion Ambulation   Ambulation assist   Ambulation activity did not occur: Safety/medical concerns          Walk 10 feet activity   Assist  Walk 10 feet activity did not occur: Safety/medical concerns        Walk 50 feet activity   Assist Walk 50 feet with 2 turns activity did not occur: Safety/medical concerns         Walk 150  feet activity   Assist Walk 150 feet activity did not occur: Safety/medical concerns         Walk 10 feet on uneven surface  activity   Assist Walk 10 feet on uneven surfaces activity did not occur: Safety/medical concerns         Wheelchair     Assist Will patient use wheelchair at discharge?: Yes Type of Wheelchair: Power Wheelchair activity did not occur:  Safety/medical concerns  Wheelchair assist level: Set up assist, Supervision/Verbal cueing Max wheelchair distance: 128ft    Wheelchair 50 feet with 2 turns activity    Assist    Wheelchair 50 feet with 2 turns activity did not occur: Safety/medical concerns   Assist Level: Set up assist, Supervision/Verbal cueing   Wheelchair 150 feet activity     Assist  Wheelchair 150 feet activity did not occur: Safety/medical concerns   Assist Level: Supervision/Verbal cueing   Blood pressure (!) 141/93, pulse 67, temperature (!) 97.4 F (36.3 C), resp. rate 16, height (P) 5\' 10"  (1.778 m), weight (P) 61.5 kg, SpO2 100 %.  Medical Problem List and Plan: 1.  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  Antibiotic therapy completed 04/28/2020- parents say 6/14- PICC out             -patient may  shower             -ELOS/Goals: goals min-mod A- 3-4 weeks   -Team conference held 7/6  -Continue CIR 2.  Antithrombotics: -DVT/anticoagulation: Subcutaneous heparin.  Check vascular study 6/23- (-) for DVTs 6/26- change to Lovenox since no renal issues and not allergic             -antiplatelet therapy: n/a 3. Pain Management: Baclofen 10 mg twice daily for spasticity. Well controlled.  4. Mood: not on any meds             -antipsychotic agents: n/a 5. Neuropsych: This patient is somewhat capable of making decisions on his own behalf. 6. Skin/Wound Care: no open skin areas- however has an area of irritation near anus- Stage I but TINY- will monitor  7/1: Added Eucerin cream for dry feet. 7. Fluids/Electrolytes/Nutrition: regular diet with thin liquids- need SLP for dysarthria/dysphagia and cognition 8.  Neurogenic bowel  and bladder.  Suppository nightly as needed, MiraLAX daily.  Check PVR's  6/23- PVRs ordered, but don't see recorded in chart- will order again, since need to know if pt retaining- having many incontinent stools- will d/w pt/parents about putting on bowel program.   6/24- d/w pt and father about bowel program - 2nd time saw pt- discussed at length. They will think about it.  6/25- will start bowel program per pt request.   6/26- went well last night- con't regimen  6/27- responding well to bowel program; needing occ I/o caths as well per nursing  6/29- needs to do bowel program- refused las tnight since thought getting control- explained if can go in bedpan, that's control- if not, he's just having accidents.   6/30- d/w pt- he's willing again to try bowel program  7/5: Had BM with bowel program yesterday 9.  Seizure disorder.  Keppra 1000 mg twice daily, Inderal 10 mg twice daily. Seizure precautions.   6/30- pt had episode of lethargy/sedation- couldn't wake  him up, even with sternal rub, was documented- will consult Neurology- due to hx of seizures- and also order labs- to make sure no infection- recent hx of menigitis  7/1: Discussed with patient that EEG and labs were stable yesterday, with the exception of leukocytosis.  7/6: Repeat seizure episode started yesterday. Neurology consulted. Currently receiving 24 hour EEG and then will receive MRI. Unable to swallow medications at this time.   7/7: MRI today. Unresponsive episode over and patient is now bright. EEG shows evidence of cerebral dysfunction in the left frontal region and a mild diffuse encephalopathy. No epileptiform discharges or EEG seizures were recorded.  7/8: MRI shows increase in size of meningiomas and vasogenic edema- discussed with patient and his mother. Appreciate neurology follow-up. Dr. Leonel Ramsay thinks partial seizures with prolonged postictal state may be cause of unresponsive episodes- discussed this with patient and  his mother. She will schedule him follow-up appointments with his neurologist and oncologist at [facility] when he leaves here.  10.  Hyponatremia.  Continue sodium chloride tablets 3 times daily.  Follow-up chemistries- last Na 136 per father  6/23- Na 136- con't salt tabs  Na 139 on 7/5. Ordered repeat BMP for Monday 11. Neurofibromatosis-2- was getting steroids- per Dr Prince Rome can reduce by 20% every few days;  -will also try and contact Dr Shaaron Adler about possible additional chemotherapy- Dr Prince Rome was CLEAR can use estim, since not cancerous dx.   6/23- will wean Decadron to 4 mg q12 hours from q8 hours for 2 days, then con't to wean.  6/25- decrease steroids due to 2mg  q12 hours today   6/27- will decrease to 2 mg daily. In 3 days, will stop, if tolerating well, esp since got thrush from steroids.   7/2: discontinued Decadron- course completed 12. Impaired function             -will order grey call bell for pt.  13. Hyperkalemia  6/23- K+ 5.7- hemolyzed- will recheck in AM  6/24- K= 4.3  K+ 3.9 on 7/5. Repeat BMP ordered for Monday 14. Thrush  6/26- add diflucan 200 mg x1 then 100 mg daily x 6 days then nystatin S&S QID x 5 days.  6/29- thrush gone from oropharynx - off steroids which should prevent recurrence  15. Dysphagia: continue dysphagia 3 diet and SLP treatment.  6/29- needs to get meds crushed in pudding/applesauce  16. ABLA: Hgb 11.7 on 7/5 17. Leukocytosis: WBC 8.9 on 7/5 18. Hypotensive: Stop Lisinopril: BP drops easily- do not add hypertensives.   7/7: BP remains low.  19. Disposition:   7/8: Plan for discharge home with parents post-CIR. Family training tomorrow. Advised to talk with [facility] tomorrow about DME and home health services. Parents will be able to hire additional assistance if needed a couple of times per week.     LOS: 17 days A FACE TO Alsey Navneet Schmuck 05/26/2020, 10:44 AM

## 2020-05-26 NOTE — Plan of Care (Signed)
Pt remains on B/B program.

## 2020-05-26 NOTE — Progress Notes (Signed)
Physical Therapy Weekly Progress Note  Patient Details  Name: Cameron Hale [name]: [id number] Date of Birth: 05-26-1976  Beginning of progress report period: May 18, 2020 End of progress report period: May 26, 2020  Today's Date: 05/26/2020 PT Individual Time: 1000-1100 PT Individual Time Calculation (min): 60 min   Patient has met 1 of 3 short term goals.  Patient continues to make very slow and limited progress towards therapy goals. Pt is currently at max A level for rolling in bed, total A to +2 for supine to/from sit, and requires assist x 2 for SB transfers. Pt has been able to initiate power wheelchair mobility with use of a goalpost for steering and requires close Supervision to min A for PWC mobility up to 150 ft. Pt remains limited in his ability to perform PWC mobility due to decreased strength and endurance of UE. Pt is also currently unable to perform independent pressure relief with use of tilt and recline features on the PWC due to decreased UE strength against gravity and inability to maneuver PWC buttons.  Family education initiated with week with patient's parents and spouse to determine if they can provide the care he will need upon d/c home.  Patient continues to demonstrate the following deficits muscle weakness, muscle joint tightness and muscle paralysis, decreased cardiorespiratoy endurance, abnormal tone and unbalanced muscle activation and decreased sitting balance, decreased postural control and decreased balance strategies and therefore will continue to benefit from skilled PT intervention to increase functional independence with mobility.  Patient not progressing toward long term goals.  See goal revision..  Plan of care revisions: Downgraded bed mobility goal from mod A to max A and transfer goal from mod A to dependent via mechanical lift due to slow progress.  PT Short Term Goals Week 2:  PT Short Term Goal 1 (Week 2): Pt will perform bed mobility with max A x1  consistently. PT Short Term Goal 1 - Progress (Week 2): Progressing toward goal PT Short Term Goal 2 (Week 2): Pt will maintain static sitting balance x10 minutes with mod A. PT Short Term Goal 2 - Progress (Week 2): Met PT Short Term Goal 3 (Week 2): Pt will drive 341 feet using power wheelchair with close supervision. PT Short Term Goal 3 - Progress (Week 2): Progressing toward goal Week 3:  PT Short Term Goal 1 (Week 3): Pt's family will demonstrate ability to assist patient with transfers with min cueing from therapist PT Short Term Goal 2 (Week 3): Pt will perform power w/c mobility x 150 ft with Supervision PT Short Term Goal 3 (Week 3): Pt will be able to verbalize pressure relief schedule of x 2 min every 30 min  Skilled Therapeutic Interventions/Progress Updates:    Pt received seated in bed with family (parents and spouse) present for initiation of hands-on family education and training. Pt appears lethargic this AM but able to respond to questions with increased time. No complaints of pain. Explained purpose of PRAFOs and importance of wearing them when pt is in bed to his family. Assisted pt with donning TEDs, socks, and pants dependently at bed level. Demonstration of how to assist pt with rolling at max A level in hospital bed. Recommending use of a hospital bed upon d/c home for patient and caregiver safety and ease of ADLs and transfers. Demonstrated placement of manual hoyer sling and how to utilize manual hoyer for transfers bed to/from w/c. Session focus on explanation of setup of equipment and will  have family perform more hands-on transfers next session. Also discussed equipment and home setup with patient and his family. They report they are looking for a wheelchair accessible Lucianne Lei and understand pt will not be safe to perform car transfers upon d/c home. Pt left semi-reclined in power w/c in room with needs in reach, family present.  Therapy Documentation Precautions:   Precautions Precautions: Fall Precaution Comments: seizure precautions Restrictions Weight Bearing Restrictions: No   Therapy/Group: Individual Therapy   Excell Seltzer, PT, DPT  05/26/2020, 12:03 PM

## 2020-05-26 NOTE — Progress Notes (Signed)
Occupational Therapy Session Note  Patient Details  Name: Cameron Hale [name]: [id number] Date of Birth: 10/13/1976  Today's Date: 05/26/2020 OT Individual Time: 1655-3748 OT Individual Time Calculation (min): 44 min   Short Term Goals: Week 2:  OT Short Term Goal 1 (Week 2): Pt will perform UB self care with max A. OT Short Term Goal 1 - Progress (Week 2): Progressing toward goal OT Short Term Goal 2 (Week 2): Pt will maintain static sitting balance with min A on EOB for 3 minutes. OT Short Term Goal 2 - Progress (Week 2): Progressing toward goal OT Short Term Goal 3 (Week 2): Pt will utilized AE and modifications as needed for self feeding with mod A. OT Short Term Goal 3 - Progress (Week 2): Met OT Short Term Goal 5 (Week 2): Pt will maintain static sitting balance with min A on EOB for 3 minutes. Week 3:  OT Short Term Goal 1 (Week 3): Pt will perform UB self care with max A. OT Short Term Goal 2 (Week 3): Pt will perform SB transfer with max A  Skilled Therapeutic Interventions/Progress Updates:    Pt greeted while in the maxi sling, in the middle of transferring back to bed with nursing staff. Per nursing, pt had a large incontinent BM. Max A of 1 for rolling Rt>Lt with 2nd helper assisting with perihygiene and brief change. He then reported needing to void bladder. Supine<sit completed with +2 assist. While EOB, pt required Mod A and vcs to maintain static sitting balance while 2nd helper assisted with urinal placement. Pts mother and wife were present for family education at this time. Had mother provided balance assistance while he voided bladder with OT providing cuing. Pt then returned to bed and family boosted pt up in bed with use of chuck pad. They reported that they will have a hospital bed at home so we discussed using hospital bed features to improve caregiver body mechanics while assisting pt with self care. With vcs from OT, family assisted pt with bed mobility to adjust chuck  pad and also to don pants, rolling Rt>Lt. At end of session pt was repositioned with pillows for comfort. Left him with all needs within reach and family present.   Therapy Documentation Precautions:  Precautions Precautions: Fall Precaution Comments: seizure precautions Restrictions Weight Bearing Restrictions: No Pain: Pain Assessment Pain Scale: 0-10 Pain Score: 0-No pain ADL:        Therapy/Group: Individual Therapy  Tametra Ahart A Areyana Leoni 05/26/2020, 3:52 PM

## 2020-05-26 NOTE — Progress Notes (Signed)
Speech Language Pathology Daily Session Note  Patient Details  Name: Cameron Hale [name]: [id number] Date of Birth: 1976-07-31  Today's Date: 05/26/2020 SLP Individual Time: 1100-1140 SLP Individual Time Calculation (min): 40 min  Short Term Goals: Week 3: SLP Short Term Goal 1 (Week 3): Patient will demonstrate efficient mastication with trials of regular textures with complete oral clearance without overt s/s of aspiration over 2 sessions prior to upgrade with supervision verbal cues. SLP Short Term Goal 2 (Week 3): Patient will utilize diaphramatic breathing at the phrase level with Min A verbal cues to achieve ~90% intelligibility. SLP Short Term Goal 3 (Week 3): Patient will perform 25 repetitions of EMST at 7 cm H2O with Max A multimodal cues with a self-perceived effort level of 7/10 without reports of dizziness, etc. SLP Short Term Goal 4 (Week 3): Patient will perform 25 repetitions of IMST at 15 cm H2O with Max A multimodal cues with a self-perceived effort level of 7/10 without reports of dizziness, etc.  Skilled Therapeutic Interventions:  Pt was seen for skilled ST session targeting family education.  Pt's wife and parents were present for today's therapy session and remained actively engaged in all training activities.   SLP provided skilled education regarding rationale for currently prescribed diet in the setting of effortful and prolonged mastication of solid textures and provided family with a handout of recommended textures to maximize carryover of diet in the home environment.  Pt's family feels very comfortable helping pt with his meals as they have been doing so over the course of pt's prolonged hospitalization.  SLP also provided skilled education regarding RMST program, including rationale for improving vocal intensity and cough strength.  SLP demonstrated device use with pt able to return demonstration for 25 repetitions of IMST and 1 repetition of EMST.  Pt was lethargic  throughout evaluation and EMST is usually more difficult for pt, resulting in pt being unable to successfully complete repetitions during today's session.  SLP discussed how to monitor for s/s of toleration and when to stop device use.  SLP also demonstrated how to adjust device resistance should current resistance become too easy for pt in the future.  Skilled education was provided regarding diaphragmatic breathing techniques to facilitate coordination of phonation with respiration in order to improve vocal intensity and improve intelligibility.  All questions were answered to pt's and family's satisfaction at this time.  Pt was left in power chair with family at bedside.  Continue per current plan of care.    Pain Pain Assessment Pain Scale: 0-10 Pain Score: 0-No pain  Therapy/Group: Individual Therapy  Oneil Behney, Selinda Orion 05/26/2020, 12:30 PM

## 2020-05-26 NOTE — Plan of Care (Signed)
  Problem: RH Bed Mobility Goal: LTG Patient will perform bed mobility with assist (PT) Description: LTG: Patient will perform bed mobility with assistance, with/without cues (PT). Flowsheets (Taken 05/26/2020 0926) LTG: Pt will perform bed mobility with assistance level of: (downgrade due to slow progress) Maximal Assistance - Patient 25 - 49% Note: Downgrade due to slow progress   Problem: RH Bed to Chair Transfers Goal: LTG Patient will perform bed/chair transfers w/assist (PT) Description: LTG: Patient will perform bed to chair transfers with assistance (PT). Flowsheets (Taken 05/26/2020 0926) LTG: Pt will perform Bed to Chair Transfers with assistance level: (downgrade due to slow progress) Dependent - mechanical lift Note: Downgrade due to slow progress

## 2020-05-27 ENCOUNTER — Inpatient Hospital Stay (HOSPITAL_COMMUNITY): Payer: Medicare Other | Admitting: Occupational Therapy

## 2020-05-27 DIAGNOSIS — K592 Neurogenic bowel, not elsewhere classified: Secondary | ICD-10-CM

## 2020-05-27 DIAGNOSIS — D62 Acute posthemorrhagic anemia: Secondary | ICD-10-CM

## 2020-05-27 DIAGNOSIS — E871 Hypo-osmolality and hyponatremia: Secondary | ICD-10-CM

## 2020-05-27 DIAGNOSIS — Q8502 Neurofibromatosis, type 2: Secondary | ICD-10-CM

## 2020-05-27 DIAGNOSIS — E876 Hypokalemia: Secondary | ICD-10-CM

## 2020-05-27 DIAGNOSIS — R569 Unspecified convulsions: Secondary | ICD-10-CM

## 2020-05-27 NOTE — Progress Notes (Signed)
Occupational Therapy Session Note  Patient Details  Name: Cameron Hale [name]: [id number] Date of Birth: 02-15-1976  Today's Date: 05/27/2020 OT Individual Time: 1330-1430 OT Individual Time Calculation (min): 60 min    Short Term Goals: Week 1:  OT Short Term Goal 1 (Week 1): Pt will perform UB self care with max A. OT Short Term Goal 1 - Progress (Week 1): Progressing toward goal OT Short Term Goal 2 (Week 1): Pt will maintain static sitting balance with max A on EOB for 3 minutes. OT Short Term Goal 2 - Progress (Week 1): Met OT Short Term Goal 3 (Week 1): Pt will utilized AE and modifications as needed for self feeding with mod A. OT Short Term Goal 3 - Progress (Week 1): Progressing toward goal Week 2:  OT Short Term Goal 1 (Week 2): Pt will perform UB self care with max A. OT Short Term Goal 1 - Progress (Week 2): Progressing toward goal OT Short Term Goal 2 (Week 2): Pt will maintain static sitting balance with min A on EOB for 3 minutes. OT Short Term Goal 2 - Progress (Week 2): Progressing toward goal OT Short Term Goal 3 (Week 2): Pt will utilized AE and modifications as needed for self feeding with mod A. OT Short Term Goal 3 - Progress (Week 2): Met OT Short Term Goal 5 (Week 2): Pt will maintain static sitting balance with min A on EOB for 3 minutes.  Skilled Therapeutic Interventions/Progress Updates:    1:1 Pt in bed when arrived and nursing reported pt needed to be cathed. Assisted with helping to rolling in bed and adjusting his clothing in prep. Pants donned in supine with total A +2. Pt very slow to respond. Total A +2 to come into sitting at EOB and required max A to maintain sitting balance. Slide board transfer into the power chair with total A +2 . Practiced driving power chair including straight pathways and turning. Pt requires A to place hand on goal post of power chair and then able to drive to the dayroom at slow speed with supervision. In the dayroom continued to  work on functional use of UEs in prep for assisting with ADLs on table top. Pt required hand over hand to perform motor task and repeat task 3-4 times before pt could initiate it and follow through. Pt reports "I know what you are asking but I can't make my arm move.". As session continued pt with more difficulty with motor task including driving the power chair back to the room. Pt then required max A to drive chair back and was unable to control the goal post control to turn like he was able to earlier in session. Pt very delayed in his verbal and motor responses today. Rn aware. Pt left in the power chair in a tilted position.   Therapy Documentation Precautions:  Precautions Precautions: Fall Precaution Comments: seizure precautions Restrictions Weight Bearing Restrictions: No General:   Vital Signs:   Pain:     Therapy/Group: Individual Therapy  Willeen Cass [facility] 05/27/2020, 3:42 PM

## 2020-05-27 NOTE — Progress Notes (Signed)
Bowel program performed. Only smear produced, Suppository set

## 2020-05-27 NOTE — Progress Notes (Signed)
[place] PHYSICAL MEDICINE & REHABILITATION PROGRESS NOTE   Subjective/Complaints: Patient seen laying in bed this morning.  He indicates he slept well overnight.  No reported issues.  ROS: Appears to deny CP, shortness of breath, nausea, vomiting, diarrhea.  Objective:   No results found. No results for input(s): WBC, HGB, HCT, PLT in the last 72 hours. No results for input(s): NA, K, CL, CO2, GLUCOSE, BUN, CREATININE, CALCIUM in the last 72 hours.  Intake/Output Summary (Last 24 hours) at 05/27/2020 1530 Last data filed at 05/27/2020 1330 Gross per 24 hour  Intake 360 ml  Output 200 ml  Net 160 ml     Physical Exam: Vital Signs Blood pressure (!) 135/92, pulse 69, temperature 98 F (36.7 C), resp. rate 18, height (P) 5\' 10"  (1.778 m), weight (P) 61.5 kg, SpO2 99 %. Constitutional: No distress . Vital signs reviewed. HENT: Normocephalic.  Atraumatic. Eyes: EOMI. No discharge. Cardiovascular: No JVD. Respiratory: Normal effort.  No stridor. GI: Non-distended. Skin: Warm and dry.  Intact. Psych: Normal mood.  Normal behavior. Musc: No edema in extremities.  No tenderness in extremities. Neuro: Alert Motor: RUE- biceps 4-/5, WE 2-/5, triceps 2/5, grip 2-/5, finger abd 2/5 LUE- biceps 3/5, WE 2-/5, triceps 2-/5, grip 1/5, finger abd 1/5 Bilateral lower extremities: 1/5, limited due to tone  Assessment/Plan: 1. 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  Physiatrist is providing close team supervision and 24 hour management of active medical problems listed below.  Physiatrist and rehab team continue to assess barriers to discharge/monitor patient progress toward functional and medical goals  Care Tool:  Bathing  Bathing activity did not occur: Safety/medical concerns Body parts bathed by patient: Chest, Abdomen, Front perineal  area   Body parts bathed by helper: Right arm, Left arm, Chest, Abdomen, Front perineal area, Buttocks, Right upper leg, Left upper leg, Right lower leg, Left lower leg, Face Body parts n/a: Right arm, Left arm, Buttocks, Right upper leg, Left upper leg, Right lower leg, Left lower leg, Face   Bathing assist Assist Level: Total Assistance - Patient < 25%     Upper Body Dressing/Undressing Upper body dressing   What is the patient wearing?: Pull over shirt    Upper body assist Assist Level: Total Assistance - Patient < 25%    Lower Body Dressing/Undressing Lower body dressing      What is the patient wearing?: Pants     Lower body assist Assist for lower body dressing: Total Assistance - Patient < 25%     Toileting Toileting Toileting Activity did not occur Landscape architect and hygiene only): N/A (no void or bm)  Toileting assist Assist for toileting: 2 Helpers     Transfers Chair/bed transfer  Transfers assist     Chair/bed transfer assist level: 2 Helpers     Locomotion Ambulation   Ambulation assist   Ambulation activity did not occur: Safety/medical concerns          Walk 10 feet activity   Assist  Walk 10 feet activity did not occur: Safety/medical concerns        Walk 50 feet activity   Assist Walk 50 feet with 2 turns activity did not occur: Safety/medical concerns         Walk 150 feet activity   Assist Walk 150 feet activity did not occur: Safety/medical concerns  Walk 10 feet on uneven surface  activity   Assist Walk 10 feet on uneven surfaces activity did not occur: Safety/medical concerns         Wheelchair     Assist Will patient use wheelchair at discharge?: Yes Type of Wheelchair: Power Wheelchair activity did not occur: Safety/medical concerns  Wheelchair assist level: Set up assist, Supervision/Verbal cueing Max wheelchair distance: 179ft    Wheelchair 50 feet with 2 turns  activity    Assist    Wheelchair 50 feet with 2 turns activity did not occur: Safety/medical concerns   Assist Level: Set up assist, Supervision/Verbal cueing   Wheelchair 150 feet activity     Assist  Wheelchair 150 feet activity did not occur: Safety/medical concerns   Assist Level: Supervision/Verbal cueing   Blood pressure (!) 135/92, pulse 69, temperature 98 F (36.7 C), resp. rate 18, height (P) 5\' 10"  (1.778 m), weight (P) 61.5 kg, SpO2 99 %.  Medical Problem List and Plan: 1.  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  Antibiotic therapy completed 04/28/2020- parents say 6/14- PICC out  Continue CIR 2.  Antithrombotics: -DVT/anticoagulation: Subcutaneous Lovenox.  Vascular study negative for DVT             -antiplatelet therapy: n/a 3. Pain Management: Baclofen 10 mg twice daily for spasticity.   Controlled on 7/10 4. Mood: not on any meds             -antipsychotic agents: n/a 5. Neuropsych: This patient is somewhat capable of making decisions on his own behalf. 6. Skin/Wound Care: no open skin areas- however has an area of irritation near anus- Stage I   Added Eucerin cream for dry feet. 7. Fluids/Electrolytes/Nutrition: regular diet with thin liquids- need SLP for dysarthria/dysphagia and cognition 8.  Neurogenic bowel and bladder.  Suppository nightly as needed, MiraLAX daily.    Bowel program 9.  Seizure disorder.  Keppra 1000 mg twice daily, Inderal 10 mg twice daily. Seizure precautions.   Consulted Neurology, appreciate recs  EEG and labs stable, with the exception of leukocytosis.  EEG showed evidence of cerebral dysfunction in the left frontal region and a mild diffuse encephalopathy.  No epileptiform discharges or EEG seizures were recorded.  MRI shows increase in size meningiomas and vasogenic edema   Suspected partial seizures with prolonged postictal state may be cause of unresponsive episodes  Follow-up with neurology as outpatient as well as oncologist at [facility].  Hyponatremia.  Continue sodium chloride tablets 3 times daily.    Sodium 139 on 7/5, labs ordered for Monday 11. Neurofibromatosis-2- was getting steroids- per Dr Prince Rome can reduce by 20% every few days;   -will also try and contact Dr Shaaron Adler about possible additional chemotherapy- Dr Prince Rome was CLEAR can use estim, since not cancerous dx.   Decadron tapered off 12. Impaired function             -Ordered grey call bell for pt.  13.  Potassium  K+ 3.9 on 7/5.   Labs ordered for Monday 14. Thrush: Resolved 15. Dysphagia: continue dysphagia 3 thin diet and SLP treatment. 16. ABLA: Hgb 11.7 on 7/5  Labs ordered for Monday 17. Leukocytosis: Resolved 18. Hypotensive: Stoped Lisinopril: BP drops easily- do not add hypertensives.   Controlled on  7/10  LOS: 18 days A FACE TO FACE EVALUATION WAS PERFORMED  Jelissa Espiritu Lorie Phenix 05/27/2020, 3:30 PM

## 2020-05-27 NOTE — Progress Notes (Signed)
Patient positioned to the left. He had a small,  incontinent bowel movement. Patient cleaned and digital stimulation done with medium size, loose stool out. Pericare done.

## 2020-05-28 ENCOUNTER — Inpatient Hospital Stay (HOSPITAL_COMMUNITY): Payer: Medicare Other | Admitting: Speech Pathology

## 2020-05-28 DIAGNOSIS — G8222 Paraplegia, incomplete: Secondary | ICD-10-CM

## 2020-05-28 DIAGNOSIS — E876 Hypokalemia: Secondary | ICD-10-CM

## 2020-05-28 LAB — GLUCOSE, CAPILLARY: Glucose-Capillary: 83 mg/dL (ref 70–99)

## 2020-05-28 NOTE — Progress Notes (Addendum)
[place] PHYSICAL MEDICINE & REHABILITATION PROGRESS NOTE   Subjective/Complaints: Patient seen laying in bed this morning.  He indicates he slept well overnight.  No reported issues overnight-small incontinent bowel movement.  However, later in the day called by rapid response-discussed repeat episode of unresponsiveness, followed by difficulty in arousal.  Discussed with neurology.  ROS: Appears to deny CP, shortness of breath, nausea, vomiting, diarrhea.  Objective:   No results found. No results for input(s): WBC, HGB, HCT, PLT in the last 72 hours. No results for input(s): NA, K, CL, CO2, GLUCOSE, BUN, CREATININE, CALCIUM in the last 72 hours.  Intake/Output Summary (Last 24 hours) at 05/28/2020 1258 Last data filed at 05/28/2020 1253 Gross per 24 hour  Intake 1053 ml  Output 700 ml  Net 353 ml     Physical Exam: Vital Signs Blood pressure 111/83, pulse 68, temperature 97.8 F (36.6 C), resp. rate 17, height (P) 5\' 10"  (1.778 m), weight (P) 61.5 kg, SpO2 100 %. Constitutional: No distress . Vital signs reviewed. HENT: Normocephalic.  Atraumatic. Eyes: Extraocular muscles limited.  No discharge. Cardiovascular: No JVD.  RRR. Respiratory: Normal effort.  No stridor.  Bilaterally clear to auscultation. GI: Non-distended.  BS +. Skin: Warm and dry.  Intact. Psych: Limited due to mentation Musc: No edema in extremities.  No tenderness in extremities. Neuro: Alert Motor: Limited movements throughout, right upper extremity limited due to tone versus guarding  Assessment/Plan: 1. 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  Physiatrist is providing close team supervision and 24 hour management of active medical problems listed below.  Physiatrist and rehab team continue to assess barriers to discharge/monitor patient progress toward  functional and medical goals  Care Tool:  Bathing  Bathing activity did not occur: Safety/medical concerns Body parts bathed by patient: Chest, Abdomen, Front perineal area   Body parts bathed by helper: Right arm, Left arm, Chest, Abdomen, Front perineal area, Buttocks, Right upper leg, Left upper leg, Right lower leg, Left lower leg, Face Body parts n/a: Right arm, Left arm, Buttocks, Right upper leg, Left upper leg, Right lower leg, Left lower leg, Face   Bathing assist Assist Level: Total Assistance - Patient < 25%     Upper Body Dressing/Undressing Upper body dressing   What is the patient wearing?: Pull over shirt    Upper body assist Assist Level: Total Assistance - Patient < 25%    Lower Body Dressing/Undressing Lower body dressing      What is the patient wearing?: Pants     Lower body assist Assist for lower body dressing: Total Assistance - Patient < 25%     Toileting Toileting Toileting Activity did not occur Landscape architect and hygiene only): N/A (no void or bm)  Toileting assist Assist for toileting: 2 Helpers     Transfers Chair/bed transfer  Transfers assist     Chair/bed transfer assist level: 2 Helpers     Locomotion Ambulation   Ambulation assist   Ambulation activity did not occur: Safety/medical concerns          Walk 10 feet activity   Assist  Walk 10 feet activity did not occur: Safety/medical concerns        Walk 50 feet activity   Assist Walk 50 feet with 2 turns activity did not occur: Safety/medical concerns         Walk 150 feet activity  Assist Walk 150 feet activity did not occur: Safety/medical concerns         Walk 10 feet on uneven surface  activity   Assist Walk 10 feet on uneven surfaces activity did not occur: Safety/medical concerns         Wheelchair     Assist Will patient use wheelchair at discharge?: Yes Type of Wheelchair: Power Wheelchair activity did not occur:  Safety/medical concerns  Wheelchair assist level: Set up assist, Supervision/Verbal cueing Max wheelchair distance: 136ft    Wheelchair 50 feet with 2 turns activity    Assist    Wheelchair 50 feet with 2 turns activity did not occur: Safety/medical concerns   Assist Level: Set up assist, Supervision/Verbal cueing   Wheelchair 150 feet activity     Assist  Wheelchair 150 feet activity did not occur: Safety/medical concerns   Assist Level: Supervision/Verbal cueing   Blood pressure 111/83, pulse 68, temperature 97.8 F (36.6 C), resp. rate 17, height (P) 5\' 10"  (1.778 m), weight (P) 61.5 kg, SpO2 100 %.  Medical Problem List and Plan: 1.  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  Antibiotic therapy completed 04/28/2020- parents say 6/14- PICC out  Continue CIR  Called later in the day by nursing for family requesting to speak with physician.  Discussed with mother past course, reviewed imaging, conveyed discussion with neurology as well as neurologist and oncologist at [place] all questions. 2.  Antithrombotics: -DVT/anticoagulation: Subcutaneous Lovenox.  Vascular study negative for DVT             -antiplatelet therapy: n/a 3. Pain Management: Baclofen 10 mg twice daily for spasticity.   Controlled on 7/11 4. Mood: not on any meds             -antipsychotic agents: n/a 5. Neuropsych: This patient is somewhat capable of making decisions on his own behalf. 6. Skin/Wound Care: no open skin areas- however has an area of irritation near anus- Stage I   Added Eucerin cream for dry feet. 7. Fluids/Electrolytes/Nutrition: regular diet with thin liquids- need SLP for dysarthria/dysphagia and  cognition 8.  Neurogenic bowel and bladder.  Suppository nightly as needed, MiraLAX daily.    Bowel program 9.  Seizure disorder.  Keppra 1000 mg twice daily, Inderal 10 mg twice daily. Seizure precautions.   Consulted Neurology, appreciate recs  EEG and labs stable, with the exception of leukocytosis.  EEG showed evidence of cerebral dysfunction in the left frontal region and a mild diffuse encephalopathy. No epileptiform discharges or EEG seizures were recorded.  MRI shows increase in size meningiomas and vasogenic edema   Suspected partial seizures with prolonged postictal state may be cause of unresponsive episodes  Follow-up with neurology as outpatient as well as oncologist at [facility]  Discussed with neurology due to repeat episode on 7/11, no changes in recommendation at this time.  Will maintain current dose of antiepileptics.  Will consider addition of secondary agent if frequent repeated occurrences. 10.  Hyponatremia.  Continue sodium chloride tablets 3 times daily.    Sodium 139 on 7/5, labs ordered for tomorrow 11. Neurofibromatosis-2-   -will also try and contact Dr Shaaron Adler about possible additional chemotherapy- Dr Prince Rome was CLEAR can use estim, since not cancerous dx.   Decadron tapered off  12. Impaired function             -Ordered grey call bell for pt.  13.  Potassium  K+ 3.9 on 7/5.   Labs ordered for tomorrow 14. Thrush: Resolved 15. Dysphagia: continue dysphagia 3 thin diet and SLP treatment. 16. ABLA: Hgb 11.7 on 7/5  Labs ordered for tomorrow 17. Leukocytosis: Resolved 18. Hypotensive: Stoped Lisinopril: BP drops easily- do not add hypertensives.   Controlled on 7/11  LOS: 19 days A FACE TO FACE EVALUATION WAS PERFORMED  Alaysiah Browder Lorie Phenix 05/28/2020, 12:58 PM

## 2020-05-28 NOTE — Significant Event (Signed)
Rapid Response Event Note  Overview: Time Called: 1206 Arrival Time: 1208 Event Type: Other (Comment) (Second set of eyes) Pt required painful stimuli to arouse. He will open his eyes, but does not sustain eye contact. He will not follow commands. He moves his mouth, but does not speak.   Initial Focused Assessment: Pt lying in bed. He opens eyes to painful stimuli. He does not follow commands, but will track RN in room when eyes are open. He will turn his head. Pt grimaces to painful stimuli in all four extremities. Ice chip placed in pt's mouth and he will begin to chew. He appears to start mouthing a response to orientation questions, but mouth movements are unintelligible. Pupils are reactive and equal in size. No twitching or seizure like activity present at time of my arrival. Lung sounds are clear, diminished. No adventitious heart sounds heard. Pulses are palpable 2+ in all extremities. Pt skin is cool to touch, blankets in placed over pt.   VS: BP 111/83, HR 68, RR 17, SpO2 100% on room air. CBG: 83  Interventions: No interventions from [place] (if not transferred): -Monitor VS and neurologic status  Call rapid response for additional needs.   Event Summary: Name of Physician Notified: Dr. Posey Pronto at 1246   Outcome: Stayed in room and stabalized Event End Time: [place]

## 2020-05-28 NOTE — Progress Notes (Signed)
SLP Cancellation Note  Patient Details Name: Cameron Hale [name]: [id number] DOB: [birth year]   Cancelled treatment:        Attempted to see pt for scheduled treatment session.  Pt was somnolent upon arrival and did not respond to any of therapist's attempts to awaken.  He had been incontinent of stool and did not awaken following hygiene and changing of brief by therapist and nurse tech.  RN made aware and reports she will go to bedside to assess pt.  Will follow up as pt is able to tolerate therapies.                                                                                                 Tylisha Danis, Selinda Orion 05/28/2020, 10:56 AM

## 2020-05-28 NOTE — Progress Notes (Signed)
Bowel program performed. Only smear produced. Suppository set. Dig stim x 2 performed.

## 2020-05-29 ENCOUNTER — Encounter (HOSPITAL_COMMUNITY): Payer: Medicare Other | Admitting: Speech Pathology

## 2020-05-29 ENCOUNTER — Inpatient Hospital Stay (HOSPITAL_COMMUNITY): Payer: Medicare Other | Admitting: Physical Therapy

## 2020-05-29 ENCOUNTER — Encounter (HOSPITAL_COMMUNITY): Payer: Medicare Other

## 2020-05-29 LAB — BASIC METABOLIC PANEL
Anion gap: 13 (ref 5–15)
BUN: 10 mg/dL (ref 6–20)
CO2: 23 mmol/L (ref 22–32)
Calcium: 9.3 mg/dL (ref 8.9–10.3)
Chloride: 105 mmol/L (ref 98–111)
Creatinine, Ser: <0.3 mg/dL — ABNORMAL LOW (ref 0.61–1.24)
Glucose, Bld: 69 mg/dL — ABNORMAL LOW (ref 70–99)
Potassium: 4 mmol/L (ref 3.5–5.1)
Sodium: 141 mmol/L (ref 135–145)

## 2020-05-29 MED ORDER — DEXAMETHASONE 1 MG/ML PO CONC
4.0000 mg | Freq: Two times a day (BID) | ORAL | Status: DC
  Administered 2020-05-29 – 2020-06-05 (×15): 4 mg via ORAL
  Filled 2020-05-29 (×16): qty 4

## 2020-05-29 NOTE — Progress Notes (Addendum)
[place] PHYSICAL MEDICINE & REHABILITATION PROGRESS NOTE   Subjective/Complaints:  Per mother who is at bedside, pt had episode of unresponsiveness around 12:45 pm and wasn't awake again until 5+ pm.   They really want to restart avastin- and to have me see what the plan is- they are scared that is we send pt home, we are saying "he's like this forever".   I spoke to Dr Shaaron Adler, who they are willing to go back and see (but not Dr Prince Rome or inpt at [facility])- he wants to start PO steroids to help the vasogenic edema seen on new brain MRI-also, wants to discuss with pt a promising new medicine since didn't have great response to Avastin in the past.   Started steroids, and l/m with PA about conversation.       ROS: unable to do ROS due to sedation  Objective:   No results found. No results for input(s): WBC, HGB, HCT, PLT in the last 72 hours. Recent Labs    05/29/20 0617  NA 141  K 4.0  CL 105  CO2 23  GLUCOSE 69*  BUN 10  CREATININE <0.30*  CALCIUM 9.3    Intake/Output Summary (Last 24 hours) at 05/29/2020 2001 Last data filed at 05/29/2020 1900 Gross per 24 hour  Intake 1072 ml  Output 400 ml  Net 672 ml     Physical Exam: Vital Signs Blood pressure 107/78, pulse 63, temperature (!) 97.5 F (36.4 C), resp. rate 15, height (P) 5\' 10"  (1.778 m), weight (P) 61.5 kg, SpO2 100 %. Constitutional: No distress . Vital signs reviewed. Pt awake, but somewhat sedated/no spontaneous response , but answers some basic questions, NAD, mother at bedside HENT: Normocephalic.  Atraumatic. 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  Intact. Psych: limited mentation; vague Musc: No edema in extremities.  No tenderness in extremities. Neuro: Alert Motor: Limited movements throughout, right upper extremity limited due to tone versus guarding  Assessment/Plan: 1. 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  Physiatrist is providing close team supervision and 24 hour management of active medical problems listed below.  Physiatrist and rehab team continue to assess barriers to discharge/monitor patient progress toward functional and medical goals  Care Tool:  Bathing  Bathing activity did not occur: Safety/medical concerns Body parts bathed by patient: Chest, Abdomen, Front perineal area   Body parts bathed by helper: Right arm, Left arm, Chest, Abdomen, Front perineal area, Buttocks, Right upper leg, Left upper leg, Right lower leg, Left lower leg, Face Body parts n/a: Right arm, Left arm, Buttocks, Right upper leg, Left upper leg, Right lower leg, Left lower leg, Face   Bathing assist Assist Level: Total Assistance - Patient < 25%     Upper Body Dressing/Undressing Upper body dressing   What is the patient wearing?: Pull over shirt    Upper body assist Assist Level: Total Assistance - Patient < 25%    Lower Body Dressing/Undressing Lower body dressing      What is the patient wearing?: Pants     Lower body assist Assist for lower body dressing: Total Assistance - Patient < 25%     Toileting Toileting Toileting Activity did not occur (Clothing management and hygiene only): N/A (no void or bm)  Toileting assist Assist for  toileting: 2 Helpers     Transfers Chair/bed transfer  Transfers assist     Chair/bed transfer assist level: Dependent - mechanical lift     Locomotion Ambulation   Ambulation assist   Ambulation activity did not occur: Safety/medical concerns          Walk 10 feet activity   Assist  Walk 10 feet activity did not occur: Safety/medical concerns        Walk 50 feet activity   Assist Walk 50 feet with 2 turns activity did not occur: Safety/medical concerns          Walk 150 feet activity   Assist Walk 150 feet activity did not occur: Safety/medical concerns         Walk 10 feet on uneven surface  activity   Assist Walk 10 feet on uneven surfaces activity did not occur: Safety/medical concerns         Wheelchair     Assist Will patient use wheelchair at discharge?: Yes Type of Wheelchair: Power Wheelchair activity did not occur: Safety/medical concerns  Wheelchair assist level: Set up assist, Supervision/Verbal cueing Max wheelchair distance: 167ft    Wheelchair 50 feet with 2 turns activity    Assist    Wheelchair 50 feet with 2 turns activity did not occur: Safety/medical concerns   Assist Level: Set up assist, Supervision/Verbal cueing   Wheelchair 150 feet activity     Assist  Wheelchair 150 feet activity did not occur: Safety/medical concerns   Assist Level: Supervision/Verbal cueing   Blood pressure 107/78, pulse 63, temperature (!) 97.5 F (36.4 C), resp. rate 15, height (P) 5\' 10"  (1.778 m), weight (P) 61.5 kg, SpO2 100 %.  Medical Problem List and Plan: 1.  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  Antibiotic therapy completed 04/28/2020- parents say 6/14- PICC out  Continue CIR  Called later in the day by nursing for family requesting to speak with physician.  Discussed with mother past course, reviewed imaging, conveyed discussion with neurology as well as neurologist and oncologist at [place] all questions.  6/12- spoke with mother at length- she and family don't want to do inpt at [facility] again, ever- willing to see Dr Shaaron Adler- spoke with Dr Shaaron Adler and started Decadron 4 mg BID per his request- will see if can get  him discharged, so can travel to see Dr Shaaron Adler next Wednesday 2.  Antithrombotics: -DVT/anticoagulation: Subcutaneous Lovenox.  Vascular study negative for DVT             -antiplatelet therapy: n/a 3. Pain Management: Baclofen 10 mg twice daily for spasticity.   Controlled on 7/11 4. Mood: not on any meds             -antipsychotic agents: n/a 5. Neuropsych: This patient is somewhat capable of making decisions on his own behalf. 6. Skin/Wound Care: no open skin areas- however has an area of irritation near anus- Stage I   Added Eucerin cream for dry feet. 7. Fluids/Electrolytes/Nutrition: regular diet with thin liquids- need SLP for dysarthria/dysphagia and cognition 8.  Neurogenic bowel and bladder.  Suppository nightly as needed, MiraLAX daily.    Bowel program 9.  Seizure disorder.  Keppra 1000 mg twice daily, Inderal 10 mg twice daily. Seizure precautions.  Consulted Neurology, appreciate recs  EEG and labs stable, with the exception of leukocytosis.  EEG showed evidence of cerebral dysfunction in the left frontal region and a mild diffuse encephalopathy. No epileptiform discharges or EEG seizures were recorded.  MRI shows increase in size meningiomas and vasogenic edema   Suspected partial seizures with prolonged postictal state may be cause of unresponsive episodes  Follow-up with neurology as outpatient as well as oncologist at [facility]  Discussed with neurology due to repeat episode on 7/11, no changes in recommendation at this time.  Will maintain current dose of antiepileptics.  Will consider addition of secondary agent if frequent repeated occurrences.  7/12- add Decadron 4 mg PO BID 10.  Hyponatremia.  Continue sodium chloride tablets 3 times daily.    Sodium 139 on 7/5, labs ordered for tomorrow 11. Neurofibromatosis-2-   -will also try and contact Dr Shaaron Adler about possible additional chemotherapy- Dr Prince Rome was CLEAR can use estim, since not cancerous dx.   Decadron tapered  off 12. Impaired function             -Ordered grey call bell for pt.  13.  Potassium  K+ 3.9 on 7/5.   Labs ordered for tomorrow 14. Thrush: Resolved 15. Dysphagia: continue dysphagia 3 thin diet and SLP treatment. 16. ABLA: Hgb 11.7 on 7/5  Labs ordered for tomorrow 17. Leukocytosis: Resolved 18. Hypotensive: Stoped Lisinopril: BP drops easily- do not add hypertensives.   Controlled on 7/11 19. Dispo  7/12- will attempt to d/c pt before next Tuesday so can f/u with Dr Shaaron Adler- also , will order teaching of bowel program to family.   I spent a total of 45 minutes on total care today with talking to pt's mother and Dr Shaaron Adler and PA.    LOS: 20 days A FACE TO FACE EVALUATION WAS PERFORMED  Cameron Hale 05/29/2020, 8:01 PM

## 2020-05-29 NOTE — Progress Notes (Signed)
Bowel program performed. Only smear produced. Suppository set. Dig stim x 2 performed.

## 2020-05-29 NOTE — Progress Notes (Signed)
SLP Cancellation Note  Patient Details Name: Cameron Hale [name]: [id number] DOB: May 25, 1976   Cancelled treatment:       Patient missed 60 minutes of skilled SLP intervention due to lethargy despite multiple attempts. RN and PA aware.                                                                                                 Marisol Glazer 05/29/2020, 2:46 PM

## 2020-05-29 NOTE — Progress Notes (Signed)
Occupational Therapy Session Note  Patient Details  Name: Cameron Hale [name]: [id number] Date of Birth: 1976-04-15  Today's Date: 05/29/2020 OT Individual Time: 1000-1100 OT Individual Time Calculation (min): 60 min    Short Term Goals: Week 3:  OT Short Term Goal 1 (Week 3): Pt will perform UB self care with max A. OT Short Term Goal 2 (Week 3): Pt will perform SB transfer with max A  Skilled Therapeutic Interventions/Progress Updates:    Pt resting in bed with family (father, mother, and wife) present for education/training. Pt's father assisted with donning pants on pt before using manual hoyer lift to transfer to w/c. Discussed/demonstrated correct body mechanics when assisting pt.  Pt's father assisted with placing hoyer sling under.  Demonstrated technique for assisting pt to roll in bed to facilitate placement of sling. Father assisted therapist with transfer to [facility].  Technique demonstrated for repositioning pt in w/c. Pt's wife and mother observed but did not participate in assisting. Discussed purpose of resting hand splint and PRAFOs during night. Pt not very verbal during session but did respond appropriately when spoken to.  Pt remained in [place] with seat belt secure and gait belt around BLE at knees for positioning. Family present awaiting PT and w/c evaluation.   Therapy Documentation Precautions:  Precautions Precautions: Fall Precaution Comments: seizure precautions Restrictions Weight Bearing Restrictions: No  Pain: Pain Assessment Pain Scale: 0-10 Pain Score: 0-No pain   Therapy/Group: Individual Therapy  Leroy Libman 05/29/2020, 11:14 AM

## 2020-05-29 NOTE — Progress Notes (Signed)
Physical Therapy Session Note  Patient Details  Name: Cameron Hale [name]: [id number] Date of Birth: 1975-12-26  Today's Date: 05/29/2020 PT Individual Time: 1100-1145 PT Individual Time Calculation (min): 45 min   Short Term Goals: Week 3:  PT Short Term Goal 1 (Week 3): Pt's family will demonstrate ability to assist patient with transfers with min cueing from therapist PT Short Term Goal 2 (Week 3): Pt will perform power w/c mobility x 150 ft with Supervision PT Short Term Goal 3 (Week 3): Pt will be able to verbalize pressure relief schedule of x 2 min every 30 min  Skilled Therapeutic Interventions/Progress Updates:    Pt received seated in power w/c in room with family present. No complaints of pain this AM. Pt and family able to engage in seating evaluation with Deatra Ina, ATP from [facility]. Discussion with patient and family with regards to best type of control for patient to maintain greatest level of independence. Pt has been utilizing a goalpost control for steering but has been unable to perform pressure relief independently due to inability to lift RUE against gravity. ATP recommending modifications to chair to support R elbow and allow for improved positioning of R limb so that pt can reach controls at all times. Also discussed addition of caregiver controls so that pt's family can safely assist him with power w/c mobility if needed due to patient having decreased arousal at times. Pt frequently falling asleep while seated in power wheelchair during session. Pt initially able to non-verbally respond to therapist when asked questions but as session progresses he exhibits decreased alertness and attention. RN notified of change in patient alertness this session. Demonstrated how to place manual hoyer sling under patient while he is seated in wheelchair via anterior and lateral leans performed dependently and having a 2nd person place sling. Per family report they practiced  transfers during OT session this AM and decline further hands-on practice this date. Manual hoyer transfer back to bed. Rolling L/R with total A due to decreased patient alertness for removal of hoyer sling. Pt left semi-reclined in bed with needs in reach, family present at end of session. Pt missed 15 min of scheduled therapy session due to fatigue and decreased alertness and ability to functionally participate.  Therapy Documentation Precautions:  Precautions Precautions: Fall Precaution Comments: seizure precautions Restrictions Weight Bearing Restrictions: No General: PT Amount of Missed Time (min): 15 Minutes PT Missed Treatment Reason: Patient fatigue    Therapy/Group: Individual Therapy   Excell Seltzer, PT, DPT  05/29/2020, 3:36 PM

## 2020-05-30 ENCOUNTER — Inpatient Hospital Stay (HOSPITAL_COMMUNITY): Payer: Medicare Other | Admitting: Occupational Therapy

## 2020-05-30 ENCOUNTER — Inpatient Hospital Stay (HOSPITAL_COMMUNITY): Payer: Medicare Other | Admitting: *Deleted

## 2020-05-30 ENCOUNTER — Inpatient Hospital Stay (HOSPITAL_COMMUNITY): Payer: Medicare Other | Admitting: Speech Pathology

## 2020-05-30 ENCOUNTER — Inpatient Hospital Stay (HOSPITAL_COMMUNITY): Payer: Medicare Other | Admitting: Physical Therapy

## 2020-05-30 NOTE — Progress Notes (Addendum)
[name] PHYSICAL MEDICINE & REHABILITATION PROGRESS NOTE   Subjective/Complaints:   Pt reports doing OK- per PT,  was very sedated- awake, but not verbally responding yesterday afternoon ~ 2-3 pm- per family, was sleepy, which occurs with him- they didn't feel was seizure activity.   Spoke with mother on phone about the plan discussed with Dr Shaaron Adler- she agreed- will plan to d/c early next week with f/u with Dr Shaaron Adler to access possible new medicine at his clinic- with f/u on next Wednesday.     ROS:  Pt denies SOB, abd pain, CP, N/V/C/D, and vision changes   Objective:   No results found. No results for input(s): WBC, HGB, HCT, PLT in the last 72 hours. Recent Labs    05/29/20 0617  NA 141  K 4.0  CL 105  CO2 23  GLUCOSE 69*  BUN 10  CREATININE <0.30*  CALCIUM 9.3    Intake/Output Summary (Last 24 hours) at 05/30/2020 1012 Last data filed at 05/30/2020 0730 Gross per 24 hour  Intake 718 ml  Output 500 ml  Net 218 ml     Physical Exam: Vital Signs Blood pressure 119/60, pulse 79, temperature (!) 97.5 F (36.4 C), temperature source Oral, resp. rate 18, height (P) 5\' 10"  (1.778 m), weight (P) 61.5 kg, SpO2 100 %. Constitutional: No distress . Vital signs reviewed. Pt awake, alert, answering questions- brighter affect, NAD HENT: Normocephalic.  Atraumatic. 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  Intact. Psych: bright but only answers questions, doesn't speak spontnaeously Musc: No edema in extremities.  No tenderness in extremities. Neuro: Alert Motor: Limited movements throughout, right upper extremity limited due to tone versus guarding  Assessment/Plan: 1. 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  Physiatrist is providing close team supervision and 24 hour management of active medical problems listed below.  Physiatrist and rehab team continue to assess barriers to discharge/monitor patient progress toward functional and medical goals  Care Tool:  Bathing  Bathing activity did not occur: Safety/medical concerns Body parts bathed by patient: Chest, Abdomen, Front perineal area   Body parts bathed by helper: Right arm, Left arm, Chest, Abdomen, Front perineal area, Buttocks, Right upper leg, Left upper leg, Right lower leg, Left lower leg, Face Body parts n/a: Right arm, Left arm, Buttocks, Right upper leg, Left upper leg, Right lower leg, Left lower leg, Face   Bathing assist Assist Level: Total Assistance - Patient < 25%     Upper Body Dressing/Undressing Upper body dressing   What is the patient wearing?: Pull over shirt    Upper body assist Assist Level: Total Assistance - Patient < 25%    Lower Body Dressing/Undressing Lower body dressing      What is the patient wearing?: Pants     Lower body assist Assist for lower body dressing: Total Assistance - Patient < 25%     Toileting Toileting Toileting Activity did not occur Landscape architect and hygiene only): N/A (no void or bm)  Toileting assist Assist for toileting: 2 Helpers     Transfers Chair/bed transfer  Transfers assist     Chair/bed transfer assist level: Dependent - mechanical lift     Locomotion Ambulation   Ambulation assist   Ambulation activity  did not occur: Safety/medical concerns          Walk 10 feet activity   Assist  Walk 10 feet activity did not occur: Safety/medical concerns        Walk 50 feet activity   Assist Walk 50 feet with 2 turns activity did not occur: Safety/medical concerns         Walk 150 feet activity   Assist Walk 150 feet activity did not occur: Safety/medical concerns         Walk 10 feet on uneven surface   activity   Assist Walk 10 feet on uneven surfaces activity did not occur: Safety/medical concerns         Wheelchair     Assist Will patient use wheelchair at discharge?: Yes Type of Wheelchair: Power Wheelchair activity did not occur: Safety/medical concerns  Wheelchair assist level: Set up assist, Supervision/Verbal cueing Max wheelchair distance: 190ft    Wheelchair 50 feet with 2 turns activity    Assist    Wheelchair 50 feet with 2 turns activity did not occur: Safety/medical concerns   Assist Level: Set up assist, Supervision/Verbal cueing   Wheelchair 150 feet activity     Assist  Wheelchair 150 feet activity did not occur: Safety/medical concerns   Assist Level: Supervision/Verbal cueing   Blood pressure 119/60, pulse 79, temperature (!) 97.5 F (36.4 C), temperature source Oral, resp. rate 18, height (P) 5\' 10"  (1.778 m), weight (P) 61.5 kg, SpO2 100 %.  Medical Problem List and Plan: 1.  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  Antibiotic therapy completed 04/28/2020- parents say 6/14- PICC out  Continue CIR  Called later in the day by nursing for family requesting to speak with physician.  Discussed with mother past course, reviewed imaging, conveyed discussion with neurology as well as neurologist and oncologist at [facility] all questions.  6/12- spoke with mother at length- she and family don't want to do inpt at [facility] again, ever- willing to see Dr Shaaron Adler- spoke with Dr Shaaron Adler and started Decadron 4 mg BID per his request- will see if can get him discharged, so can travel to see Dr Shaaron Adler next Wednesday 2.  Antithrombotics: -DVT/anticoagulation: Subcutaneous  Lovenox.  Vascular study negative for DVT             -antiplatelet therapy: n/a 3. Pain Management: Baclofen 10 mg twice daily for spasticity.   Controlled on 7/11 4. Mood: not on any meds             -antipsychotic agents: n/a 5. Neuropsych: This patient is somewhat capable of making decisions on his own behalf. 6. Skin/Wound Care: no open skin areas- however has an area of irritation near anus- Stage I   Added Eucerin cream for dry feet. 7. Fluids/Electrolytes/Nutrition: regular diet with thin liquids- need SLP for dysarthria/dysphagia and cognition 8.  Neurogenic bowel and bladder.  Suppository nightly as needed, MiraLAX daily.    Bowel program  7/13- placed order to teach family bowel program- wasn't done last night based on documentation since family left ~ 3pm. 9.  Seizure disorder.  Keppra 1000 mg twice daily, Inderal 10 mg twice daily. Seizure precautions.   Consulted Neurology, appreciate recs  EEG and labs stable, with the exception of leukocytosis.  EEG showed evidence of cerebral dysfunction in the left frontal region and a mild diffuse encephalopathy. No epileptiform discharges or EEG seizures were recorded.  MRI shows increase in size meningiomas and vasogenic edema   Suspected partial seizures with prolonged postictal state may be cause of unresponsive episodes  Follow-up with neurology as outpatient as well as oncologist at [facility]  Discussed with neurology due to repeat episode on 7/11, no changes in recommendation at this time.  Will maintain current dose of antiepileptics.  Will consider addition of secondary agent if frequent repeated occurrences.  7/12- add Decadron 4 mg PO BID  7/13- spoke with mother about plan with Dr Shaaron Adler- she agrees and is happy with plan currently.  10.  Hyponatremia.  Continue sodium chloride tablets 3 times daily.    Sodium 139 on 7/5, labs ordered for tomorrow  7/13- Na 141 11. Neurofibromatosis-2-   -will also try and contact Dr Shaaron Adler  about possible additional chemotherapy- Dr Prince Rome was CLEAR can use estim, since not cancerous dx.   Decadron tapered off 12. Impaired function             -Ordered grey call bell for pt.  13.  Potassium- hypokalemia  K+ 3.9 on 7/5.   7/13- K+ 4.0  Labs ordered for tomorrow 14. Thrush: Resolved 15. Dysphagia: continue dysphagia 3 thin diet and SLP treatment. 16. ABLA: Hgb 11.7 on 7/5  Labs ordered for tomorrow 17. Leukocytosis: Resolved 18. Hypotensive: Stoped Lisinopril: BP drops easily- do not add hypertensives.   Controlled on 7/11 19. Dispo  7/12- will attempt to d/c pt before next Tuesday so can f/u with Dr Shaaron Adler- also , will order teaching of bowel program to family.  7/13- will d/w team to try and d/c by next Tuesday    I spent a total of 45 minutes on total care today- by speaking with OT, PA and mother on phone about pt's care.    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 And needs tilt and recline due to risk of pressure ulcers-   Also needs a hospital bed to make transfers with hoyer lift possible- in regular bed, would not be able to reposition with family's help- need hospital for repositioning, decrease risk of pressure ulcers and transfers.   LOS: 21 days A FACE TO FACE EVALUATION WAS PERFORMED  Cameron Hale 05/30/2020, 10:12 AM

## 2020-05-30 NOTE — Plan of Care (Signed)
  Problem: RH Balance Goal: LTG: Patient will maintain dynamic sitting balance (OT) Description: LTG:  Patient will maintain dynamic sitting balance with assistance during activities of daily living (OT) Flowsheets (Taken 05/30/2020 1551) LTG: Pt will maintain dynamic sitting balance during ADLs with: (downgraded JLS) Moderate Assistance - Patient 50 - 74% Note: Downgraded JLS   Problem: RH Grooming Goal: LTG Patient will perform grooming w/assist,cues/equip (OT) Description: LTG: Patient will perform grooming with assist, with/without cues using equipment (OT) Flowsheets (Taken 05/30/2020 1551) LTG: Pt will perform grooming with assistance level of: (downgraded JLS) Maximal Assistance - Patient 25 - 49% Note: downgraded JLS   Problem: RH Bathing Goal: LTG Patient will bathe all body parts with assist levels (OT) Description: LTG: Patient will bathe all body parts with assist levels (OT) Flowsheets (Taken 05/30/2020 1551) LTG: Pt will perform bathing with assistance level/cueing: (downgraded JLS) Maximal Assistance - Patient 25 - 49% Note: downgraded JLS   Problem: RH Dressing Goal: LTG Patient will perform upper body dressing (OT) Description: LTG Patient will perform upper body dressing with assist, with/without cues (OT). Flowsheets (Taken 05/30/2020 1551) LTG: Pt will perform upper body dressing with assistance level of: (downgraded JLS) Maximal Assistance - Patient 25 - 49% Note: downgraded JLS Goal: LTG Patient will perform lower body dressing w/assist (OT) Description: LTG: Patient will perform lower body dressing with assist, with/without cues in positioning using equipment (OT) Outcome: Not Applicable Flowsheets (Taken 05/30/2020 1551) LTG: Pt will perform lower body dressing with assistance level of: (d/c goal) -- Note: Will required total A - JLS   Problem: RH Toileting Goal: LTG Patient will perform toileting task (3/3 steps) with assistance level (OT) Description: LTG:  Patient will perform toileting task (3/3 steps) with assistance level (OT)  Outcome: Not Met (add Reason) Flowsheets (Taken 05/30/2020 1551) LTG: Pt will perform toileting task (3/3 steps) with assistance level: (d/c goal) -- Note: D/c goal- needs bowel program and will go home with foley or intermittent cathing

## 2020-05-30 NOTE — Progress Notes (Signed)
Occupational Therapy Session Note  Patient Details  Name: CAYDIN YEATTS [name]: [id number] Date of Birth: Dec 30, 1975  Today's Date: 05/30/2020 OT Individual Time: 1400-1440 OT Individual Time Calculation (min): 40 min    Short Term Goals: Week 3:  OT Short Term Goal 1 (Week 3): Pt will perform UB self care with max A. OT Short Term Goal 2 (Week 3): Pt will perform SB transfer with max A  Skilled Therapeutic Interventions/Progress Updates:    Upon entering the room, pt seated in power chair and agreeable to return to bed during this session. Bryan pointing to arm rest and indicating set up for transfer. OT placing slide board and total A of therapist and second helper steadying equipment for safety. Sit >supine with total A as well. Pt rolling with total A L <> R and pt's brief incontinent of BM and he was unaware. Total A for hygiene and clothing management. PRAFO boots donned and pt positioned for comfort and safety. All needs within reach. Soft call bell placed under pt's L elbow and he demonstrated ability to hit it.  Therapy Documentation Precautions:  Precautions Precautions: Fall Precaution Comments: seizure precautions Restrictions Weight Bearing Restrictions: No Vital Signs: Therapy Vitals Temp: 97.6 F (36.4 C) Pulse Rate: 99 Resp: 16 BP: 96/73 Patient Position (if appropriate): Sitting Oxygen Therapy SpO2: 97 % O2 Device: Room Air   Therapy/Group: Individual Therapy  Gypsy Decant 05/30/2020, 2:42 PM

## 2020-05-30 NOTE — Plan of Care (Signed)
  Problem: RH Balance Goal: LTG: Patient will maintain dynamic sitting balance (OT) Description: LTG:  Patient will maintain dynamic sitting balance with assistance during activities of daily living (OT) Flowsheets (Taken 05/30/2020 1551) LTG: Pt will maintain dynamic sitting balance during ADLs with: (downgraded JLS) Moderate Assistance - Patient 50 - 74% Note: Downgraded JLS

## 2020-05-30 NOTE — Progress Notes (Signed)
Pt had a large liquid type bowel movement at 0046 and bowel program was performed by day shift nurse around 6:35pm.

## 2020-05-30 NOTE — Progress Notes (Signed)
Physical Therapy Session Note  Patient Details  Name: Cameron Hale [name]: [id number] Date of Birth: Nov 25, 1975  Today's Date: 05/30/2020 PT Individual Time: 1015-1100 PT Individual Time Calculation (min): 45 min   Short Term Goals: Week 3:  PT Short Term Goal 1 (Week 3): Pt's family will demonstrate ability to assist patient with transfers with min cueing from therapist PT Short Term Goal 2 (Week 3): Pt will perform power w/c mobility x 150 ft with Supervision PT Short Term Goal 3 (Week 3): Pt will be able to verbalize pressure relief schedule of x 2 min every 30 min  Skilled Therapeutic Interventions/Progress Updates:    Pt received seated in power w/c in room, agreeable to PT session. Pt more alert and engaged in therapy session this date and able to verbally respond to therapist. No complaints of pain. Reviewed questions with patient and his mother from previous sessions including family education and power wheelchair mobility. Pt is at min A level for power w/c mobility 2 x 150 ft with use of RUE on goalpost controller with assist needed for RUE support and control due to decreased endurance, strength, and coordination. Pt requires hand-over-hand assist at times for controller management. Will continue to assess best fit for patient's needs with regards to wheelchair controls. Slide board transfer w/c to/from mat table with total A x 2. Seated balance EOM with mod A for trunk control while pt's posture assessed for seating evaluation. Pt left semi-reclined in power w/c in room with needs in reach, soft touch call button in reach at end of session.  Therapy Documentation Precautions:  Precautions Precautions: Fall Precaution Comments: seizure precautions Restrictions Weight Bearing Restrictions: No    Therapy/Group: Individual Therapy   Excell Seltzer, PT, DPT  05/30/2020, 12:37 PM

## 2020-05-30 NOTE — Progress Notes (Signed)
Patient ID: Cameron Hale, male   DOB: [birth year], 44 y.o.   [name]: [id number]  SW sent order for hospital bed, extra large sling, and DABSC to [facility] (p: [phone]/N:[phone]).  SW spoke with pt mother to provide updates from team conference, change in d/c date to 7/19, and discuss ordering DME. Pt mother prefers fully electric hospital bed. SW explained this would not be covered under insurance, but she can discuss options for rental with vendor. Preferred [facility] is [facility].   SW discussed hospital bed with vendor, states will discuss options with pt when order has finished processing.  SW spoke with [facility] [phone]) who was able to accept referral for HHPT/OT/ST/aide; [facility].   Loralee Pacas, MSW, [place] Office: [phone] Cell: [phone] Fax: [phone]

## 2020-05-30 NOTE — Progress Notes (Signed)
Occupational Therapy Session Note  Patient Details  Name: Cameron Hale [name]: [id number] Date of Birth: 1976/07/24  Today's Date: 05/30/2020 OT Individual Time: 3154-0086 OT Individual Time Calculation (min): 55 min    Short Term Goals: Week 3:  OT Short Term Goal 1 (Week 3): Pt will perform UB self care with max A. OT Short Term Goal 2 (Week 3): Pt will perform SB transfer with max A  Skilled Therapeutic Interventions/Progress Updates:    Pt resting in bed upon arrival and ready to get OOB.  OT intervention with focus on bed mobility, sitting balance, SB transfers, following one step commands, and activity tolerance.  Rolling R/L in bed with max A+1 to facilitate changing brief and donning pants. Pt dependent for donning pants at bed level. Supine>sit EOB with max A.  SB transfer with tot A+1. Pt following one step commands but verbal responses limited and weak in volume. Pt does not initiate any transitional movements or functional tasks.  Pt dependent for UB bathing in w/c and donning shirt. Pt remained in PWV with seat belt secure and call bell secure where pt can activate.   Therapy Documentation Precautions:  Precautions Precautions: Fall Precaution Comments: seizure precautions Restrictions Weight Bearing Restrictions: No Pain:  Pt denies pain this morning   Therapy/Group: Individual Therapy  Leroy Libman 05/30/2020, 9:02 AM

## 2020-05-30 NOTE — Plan of Care (Signed)
  Problem: Consults Goal: RH SPINAL CORD INJURY PATIENT EDUCATION Description:  See Patient Education module for education specifics.  Outcome: Progressing Goal: Skin Care Protocol Initiated - if Braden Score 18 or less Description: If consults are not indicated, leave blank or document N/A Outcome: Progressing   Problem: SCI BOWEL ELIMINATION Goal: RH STG MANAGE BOWEL WITH ASSISTANCE Description: STG Manage Bowel with max to total Assistance. Outcome: Progressing Goal: RH STG SCI MANAGE BOWEL WITH MEDICATION WITH ASSISTANCE Description: STG SCI Manage bowel with medication with max to total assistance. Outcome: Progressing Goal: RH STG SCI MANAGE BOWEL PROGRAM W/ASSIST OR AS APPROPRIATE Description: STG SCI Manage bowel program with max to total assist or as appropriate. Outcome: Progressing   Problem: SCI BLADDER ELIMINATION Goal: RH STG MANAGE BLADDER WITH ASSISTANCE Description: STG Manage Bladder With max Assistance Outcome: Progressing Goal: RH STG SCI MANAGE BLADDER PROGRAM W/ASSISTANCE Description: Manage bladder program with max assistance. Outcome: Progressing   Problem: RH SKIN INTEGRITY Goal: RH STG SKIN FREE OF INFECTION/BREAKDOWN Description: Skin to remain free from breakdown while on rehab with mod assistance. Outcome: Progressing Goal: RH STG MAINTAIN SKIN INTEGRITY WITH ASSISTANCE Description: STG Maintain Skin Integrity With mod Assistance. Outcome: Progressing Goal: RH STG ABLE TO PERFORM INCISION/WOUND CARE W/ASSISTANCE Description: STG Able To Perform Incision/Wound Care With mod Assistance. Outcome: Progressing   Problem: RH SAFETY Goal: RH STG ADHERE TO SAFETY PRECAUTIONS W/ASSISTANCE/DEVICE Description: STG Adhere to Safety Precautions With mod Assistance and appropriate assistive Device. Outcome: Progressing   Problem: RH PAIN MANAGEMENT Goal: RH STG PAIN MANAGED AT OR BELOW PT'S PAIN GOAL Description: <4 on a 0-10 pain scale. Outcome:  Progressing   Problem: RH KNOWLEDGE DEFICIT SCI Goal: RH STG INCREASE KNOWLEDGE OF SELF CARE AFTER SCI Description: Patient and caregiver will be able to demonstrate medication management, bowel program, bladder program, safety precautions, skin care, and follow up care with the MD with min assist from CIR staff. Outcome: Progressing

## 2020-05-30 NOTE — Patient Care Conference (Signed)
Inpatient RehabilitationTeam Conference and Plan of Care Update Date: 05/30/2020   Time: 5:18 PM    Patient Name: Cameron Hale      Medical Record Number: [id number]  Date of Birth: 05/28/1976 Sex: Male         Room/Bed: 4W22C/4W22C-01 Payor Info: Payor: MEDICARE / Plan: MEDICARE PART A AND B / Product Type: *No Product type* /    Admit Date/Time:  05/09/2020  1:48 PM  Primary Diagnosis:  Quadriplegia [facility])  Hospital Problems: Principal Problem:   Quadriplegia ([place]) Active Problems:   NF2 (neurofibromatosis 2) ([place])   Incomplete paraplegia ([place])   Acute blood loss anemia   Hypokalemia   Hyponatremia   Seizures ([place])   Neurogenic bowel    Expected Discharge Date: Expected Discharge Date: 06/05/20  Team Members Present: Physician leading conference: Dr. Courtney Heys Care Coodinator Present: Loralee Pacas, LCSWA;Andreanna Mikolajczak Creig Hines, RN, BSN, CRRN Nurse Present: Renda Rolls, LPN PT Present: Excell Seltzer, PT OT Present: Roanna Epley, COTA;Jennifer Tamala Julian, OT SLP Present: Weston Anna, SLP PPS Coordinator present : Ileana Ladd, Burna Mortimer, SLP     Current Status/Progress Goal Weekly Team Focus  Bowel/Bladder   Patient is still incontient of bowel and bladder. PVR is TID and if >250, can cath. Also q8 cath with no void. Pt is in bowel program and LBM is 7/13.  To become more continent of B/B, and have less retention.  Assess tolieting needs at least every 2 hours.   Swallow/Nutrition/ Hydration   Dys. 3 textures with thin liquids, supervision  Supervision  tolerance of diet and use of swallowing compensatory strategies   ADL's   bathing/dressing-tot A/dependent at bed level and sitting in w/c; SB transfers tot A+2; sitting balance-min a/mod A; bed mobility-max A+2  bathing-mod A; UB dressing-min A; LB dressing-mod A; toileting-max A; toilet transfers-max A; self feeding-min A- to be downgraded to max A overall and dependent transfers with hoyer  education; d/c  planning, activity tolerance   Mobility   max to total A rolling, total A supine to/from sit, total A +2 SB transfer vs dependent via lift, Supervision to min A power w/c mobility  downgraded to mod A bed mobility, dependent via lift for transfers, Supervision w/c mobility  family education, d/c planning   Communication   Supervision-Mod I  Mod I  use of diaphragamatic breathing, RMT   Safety/Cognition/ Behavioral Observations            Pain   No complaints of pain during this shift but has had headaches 5/10 in the past week.  To keep pain level below 2/10.  Assess pain q shift or prn.   Skin   Ecchymosis to bilateral sides of abdomen, MASD still to buttocks and applying barrier cream.  To prevent skin breakdown from occurring.  Assess skin q shift or prn.     Team Discussion:  Discharge Planning/Teaching Needs:  D/c to his parents home.  Family education as recommended by therapy. Fam edu on 7/15 at 10am-3pm with pt mother/father   Current Update:  none  Current Barriers to Discharge:  Neurogenic bowel and bladder  Possible Resolutions to Barriers: I&O cath's, bowel program, possible foley insertion at discharge, nursing to monitor.  Patient on target to meet rehab goals: yes, max-total assist, problem solving for power chair controls, OT downgrading goals. Dys3/thin, SLP doing respiratory muscle training. Mom, dad, and wife have been in for family education.  *See Care Plan and progress notes for long and short-term goals.  Revisions to Treatment Plan:  Parents need to come in for bowel program training at scheduled time of bowel program.    Medical Summary Current Status: incontinent B/B- teach bowel program is goal- foley? Weekly Focus/Goal: family done ed so far- done well- max-total A for bed; seating eval for w/c yesterday-  Barriers to Discharge: Home enviroment access/layout;Incontinence;Neurogenic Bowel & Bladder;Weight bearing restrictions;Medical stability   Barriers to Discharge Comments: likely seizures- increased keppra and added decadron as well- d/c 7/19 Possible Resolutions to Barriers: OT- minimal progress- family ed being done- using hoyer lift- with sling. SLP- D3 with thin liquids- family ed done as well   Continued Need for Acute Rehabilitation Level of Care: The patient requires daily medical management by a physician with specialized training in physical medicine and rehabilitation for the following reasons: Direction of a multidisciplinary physical rehabilitation program to maximize functional independence : Yes Medical management of patient stability for increased activity during participation in an intensive rehabilitation regime.: Yes Analysis of laboratory values and/or radiology reports with any subsequent need for medication adjustment and/or medical intervention. : Yes   I attest that I was present, lead the team conference, and concur with the assessment and plan of the team.   Cristi Loron 05/30/2020, 5:18 PM

## 2020-05-30 NOTE — Plan of Care (Signed)
  Problem: RH Pre-functional/Other (Specify) Goal: RH LTG OT (Specify) 1 Description: RH LTG OT (Specify) 1 Flowsheets (Taken 05/30/2020 1554) LTG: Other OT (Specify) 1: Pt/ caregiver will complete hoyer lift transfer education and will be I Note: New goal

## 2020-05-30 NOTE — Plan of Care (Signed)
  Problem: RH Swallowing Goal: LTG Pt will demonstrate functional change in swallow as evidenced by bedside/clinical objective assessment (SLP) Description: LTG: Patient will demonstrate functional change in swallow as evidenced by bedside/clinical objective assessment (SLP) Outcome: Not Applicable Note: Goal discharged as plan is to stay on Dys. 3 textures    Problem: RH Swallowing Goal: LTG Patient will consume least restrictive diet using compensatory strategies with assistance (SLP) Description: LTG:  Patient will consume least restrictive diet using compensatory strategies with assistance (SLP) Flowsheets (Taken 05/30/2020 0626) LTG: Pt Patient will consume least restrictive diet using compensatory strategies with assistance of (SLP): Supervision Note: Downgraded due to lack of progress Goal: LTG Patient will participate in dysphagia therapy to increase swallow function with assistance (SLP) Description: LTG:  Patient will participate in dysphagia therapy to increase swallow function with assistance (SLP) Flowsheets (Taken 05/30/2020 0626) LTG: Pt will participate in dysphagia therapy to increase swallow function with assistance of (SLP): Supervision Note: Downgraded due to lack of progress   Problem: RH Expression Communication Goal: LTG Patient will increase speech intelligibility (SLP) Description: LTG: Patient will increase speech intelligibility at word/phrase/conversation level with cues, % of the time (SLP) Flowsheets (Taken 05/30/2020 0626) LTG: Patient will increase speech intelligibility (SLP): Supervision Level: (sentence) -- Percent of time patient will use intelligible speech: 90% Note: Downgraded due to lack of progress

## 2020-05-30 NOTE — Progress Notes (Signed)
Speech Language Pathology Daily Session Note  Patient Details  Name: Cameron Hale [name]: [id number] Date of Birth: 1976-06-01  Today's Date: 05/30/2020 SLP Individual Time: 1130-1155 SLP Individual Time Calculation (min): 25 min  Short Term Goals: Week 3: SLP Short Term Goal 1 (Week 3): Patient will demonstrate efficient mastication with trials of regular textures with complete oral clearance without overt s/s of aspiration over 2 sessions prior to upgrade with supervision verbal cues. SLP Short Term Goal 2 (Week 3): Patient will utilize diaphramatic breathing at the phrase level with Min A verbal cues to achieve ~90% intelligibility. SLP Short Term Goal 3 (Week 3): Patient will perform 25 repetitions of EMST at 7 cm H2O with Max A multimodal cues with a self-perceived effort level of 7/10 without reports of dizziness, etc. SLP Short Term Goal 4 (Week 3): Patient will perform 25 repetitions of IMST at 15 cm H2O with Max A multimodal cues with a self-perceived effort level of 7/10 without reports of dizziness, etc.  Skilled Therapeutic Interventions: Skilled treatment session focused on speech goals. Upon arrival, patient was awake while upright in his wheelchair. Patient's voice was essentially aphonic but able to achieve phonation with moderate verbal and visual cues needed for use of diaphragmatic breathing at the phrase level. With cues, patient was essentially 50% intelligible. Patient performed 25 repetitions of IMST at 15 cm H2O with Min verbal cues and a self-perceived effort level of 8/10. Patient also performed 25 repetitions of EMT at 6 cm H2O with Min-Mod verbal and tactile cues with a self-perceived effort level of 8/10. Overall, patient appeared lethargic and required extra time for verbal responses and to initiate RMT exercises. Patient left upright in the wheelchair with alarm on and all needs within reach. Continue with current plan of care.      Pain No/Denies Pain    Therapy/Group: Individual Therapy  Atira Borello 05/30/2020, 11:58 AM

## 2020-05-31 ENCOUNTER — Inpatient Hospital Stay (HOSPITAL_COMMUNITY): Payer: Medicare Other | Admitting: Occupational Therapy

## 2020-05-31 ENCOUNTER — Inpatient Hospital Stay (HOSPITAL_COMMUNITY): Payer: Medicare Other | Admitting: Physical Therapy

## 2020-05-31 NOTE — Progress Notes (Signed)
Physical Therapy Session Note  Patient Details  Name: Cameron Hale [name]: [id number] Date of Birth: 01-05-1976  Today's Date: 05/31/2020 PT Individual Time: 1430-1530 PT Individual Time Calculation (min): 60 min   Short Term Goals: Week 3:  PT Short Term Goal 1 (Week 3): Pt's family will demonstrate ability to assist patient with transfers with min cueing from therapist PT Short Term Goal 2 (Week 3): Pt will perform power w/c mobility x 150 ft with Supervision PT Short Term Goal 3 (Week 3): Pt will be able to verbalize pressure relief schedule of x 2 min every 30 min  Skilled Therapeutic Interventions/Progress Updates:    Pt received seated in PWC in room, agreeable to PT session. No complaints of pain. Assisted pt with driving [place] to therapy gym for time conservation. Slide board transfer [place] to mat table to loaner PWC from [place] with total A +2 for SB transfer. Reviewed use of t-handle joystick and introduced head array controls. With use of a t-handle joystick patient is close Supervision for PWC mobility x 100 ft. Pt then exhibits onset of RUE fatigue and requires min A for RUE control. Pt reports fatigue in his R hand fingers vs entire UE and requires assist for correct placement of hand on control for optimal driving control. Attempted to have patient utilize head array controls, however due to limited cervical ROM from history of surgeries he is unable to reach controls. Attempted to build up controls with use of a pillow and/or towel roll but pt still unable to utilize head array controls accurately. After discussion with Deatra Ina, ATP, will attempt to set up [place] with mini-joystick. Pt is min A to drive PWC x 829 ft back to his room from therapy gym utilizing t-handle joystick. Pt requesting to urinate at end of session, RN notified and pt left seated in [place] in room with soft touch call button in reach.  Therapy Documentation Precautions:  Precautions Precautions: Fall Precaution  Comments: seizure precautions Restrictions Weight Bearing Restrictions: No    Therapy/Group: Individual Therapy   Excell Seltzer, PT, DPT  05/31/2020, 4:41 PM

## 2020-05-31 NOTE — Progress Notes (Signed)
[place] PHYSICAL MEDICINE & REHABILITATION PROGRESS NOTE   Subjective/Complaints: Patient is much more alert today.  Eating breakfast with therapy and mother is at bedside.  Mother is concerned about foley- prefers to allow him to void on own and do in and out caths if needed.  ROS:  Pt denies SOB, abd pain, CP, N/V/C/D, and vision changes  Objective:   No results found. No results for input(s): WBC, HGB, HCT, PLT in the last 72 hours. Recent Labs    05/29/20 0617  NA 141  K 4.0  CL 105  CO2 23  GLUCOSE 69*  BUN 10  CREATININE <0.30*  CALCIUM 9.3    Intake/Output Summary (Last 24 hours) at 05/31/2020 2001 Last data filed at 05/31/2020 1720 Gross per 24 hour  Intake 250 ml  Output 350 ml  Net -100 ml     Physical Exam: Vital Signs Blood pressure 114/86, pulse 79, temperature (!) 97.4 F (36.3 C), resp. rate 15, height (P) 5\' 10"  (1.778 m), weight 65.6 kg, SpO2 100 %. General: Alert and oriented x 3, No apparent distress HEENT: Head is normocephalic, atraumatic, PERRLA, EOMI, sclera anicteric, oral mucosa pink and moist, dentition intact, ext ear canals clear,  Neck: Supple without JVD or lymphadenopathy Heart: Reg rate and rhythm. No murmurs rubs or gallops Chest: CTA bilaterally without wheezes, rales, or rhonchi; no distress Abdomen: Soft, non-tender, non-distended, bowel sounds positive. Extremities: No clubbing, cyanosis, or edema. Pulses are 2+ Skin: Warm and dry.  Intact. Psych: bright but only answers questions, doesn't speak spontnaeously Musc: No edema in extremities.  No tenderness in extremities. Neuro: Alert Motor: Limited movements throughout, right upper extremity limited due to tone versus guarding  Assessment/Plan: 1. 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  Physiatrist is providing close team  supervision and 24 hour management of active medical problems listed below.  Physiatrist and rehab team continue to assess barriers to discharge/monitor patient progress toward functional and medical goals  Care Tool:  Bathing  Bathing activity did not occur: Safety/medical concerns Body parts bathed by patient: Chest, Abdomen, Front perineal area   Body parts bathed by helper: Right arm, Left arm, Chest, Abdomen, Front perineal area, Buttocks, Right upper leg, Left upper leg, Right lower leg, Left lower leg, Face Body parts n/a: Right arm, Left arm, Buttocks, Right upper leg, Left upper leg, Right lower leg, Left lower leg, Face   Bathing assist Assist Level: Total Assistance - Patient < 25%     Upper Body Dressing/Undressing Upper body dressing   What is the patient wearing?: Pull over shirt    Upper body assist Assist Level: Total Assistance - Patient < 25%    Lower Body Dressing/Undressing Lower body dressing      What is the patient wearing?: Pants     Lower body assist Assist for lower body dressing: Total Assistance - Patient < 25%     Toileting Toileting Toileting Activity did not occur Landscape architect and hygiene only): N/A (no void or bm)  Toileting assist Assist for toileting: 2 Helpers     Transfers Chair/bed transfer  Transfers assist     Chair/bed transfer assist level: 2 Helpers     Locomotion Ambulation   Ambulation assist   Ambulation activity did not occur: Safety/medical concerns          Walk 10 feet activity   Assist  Walk 10 feet activity did not occur: Safety/medical concerns        Walk 50 feet activity   Assist Walk 50 feet with 2 turns activity did not occur: Safety/medical concerns         Walk 150 feet activity   Assist Walk 150 feet activity did not occur: Safety/medical concerns         Walk 10 feet on uneven surface  activity   Assist Walk 10 feet on uneven surfaces activity did not occur:  Safety/medical concerns         Wheelchair     Assist Will patient use wheelchair at discharge?: Yes Type of Wheelchair: Power Wheelchair activity did not occur: Safety/medical concerns  Wheelchair assist level: Minimal Assistance - Patient > 75% Max wheelchair distance: 150'    Wheelchair 50 feet with 2 turns activity    Assist    Wheelchair 50 feet with 2 turns activity did not occur: Safety/medical concerns   Assist Level: Minimal Assistance - Patient > 75%   Wheelchair 150 feet activity     Assist  Wheelchair 150 feet activity did not occur: Safety/medical concerns   Assist Level: Minimal Assistance - Patient > 75%   Blood pressure 114/86, pulse 79, temperature (!) 97.4 F (36.3 C), resp. rate 15, height (P) 5\' 10"  (1.778 m), weight 65.6 kg, SpO2 100 %.  Medical Problem List and Plan: 1.  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  Antibiotic therapy completed 04/28/2020- parents say 6/14- PICC out  Continue CIR  Called later in the day by nursing for family requesting to speak with physician.  Discussed with mother past course, reviewed imaging, conveyed discussion with neurology as well as neurologist and oncologist at [facility] all questions.  6/12- spoke with mother at length- she and family don't want to do inpt at [facility] again, ever- willing to see Dr Shaaron Adler- spoke with Dr Shaaron Adler and started Decadron 4 mg BID per his request- will see if can get him discharged, so can travel to see Dr Shaaron Adler next Wednesday 2.  Antithrombotics: -DVT/anticoagulation: Subcutaneous Lovenox.  Vascular study negative for DVT             -antiplatelet therapy: n/a 3. Pain Management: Baclofen 10 mg  twice daily for spasticity.   Controlled 7/14 4. Mood: not on any meds             -antipsychotic agents: n/a 5. Neuropsych: This patient is somewhat capable of making decisions on his own behalf. 6. Skin/Wound Care: no open skin areas- however has an area of irritation near anus- Stage I   Added Eucerin cream for dry feet. 7. Fluids/Electrolytes/Nutrition: regular diet with thin liquids- need SLP for dysarthria/dysphagia and cognition 8.  Neurogenic bowel and bladder.  Suppository nightly as needed, MiraLAX daily.    Bowel program  7/13- placed order to teach family bowel program- wasn't done last night based on documentation since family left ~ 3pm. 9.  Seizure disorder.  Keppra 1000 mg twice daily, Inderal 10 mg twice daily. Seizure precautions.   Consulted Neurology, appreciate recs  EEG and labs stable, with the exception of leukocytosis.  EEG showed evidence of cerebral dysfunction in the left frontal region and a mild diffuse encephalopathy. No epileptiform discharges or EEG  seizures were recorded.  MRI shows increase in size meningiomas and vasogenic edema   Suspected partial seizures with prolonged postictal state may be cause of unresponsive episodes  Follow-up with neurology as outpatient as well as oncologist at [facility]  Discussed with neurology due to repeat episode on 7/11, no changes in recommendation at this time.  Will maintain current dose of antiepileptics.  Will consider addition of secondary agent if frequent repeated occurrences.  7/12- add Decadron 4 mg PO BID  7/13- spoke with mother about plan with Dr Shaaron Adler- she agrees and is happy with plan currently.  10.  Hyponatremia.  Continue sodium chloride tablets 3 times daily.    Sodium 139 on 7/5, labs ordered for tomorrow  7/13- Na 141 11. Neurofibromatosis-2-   -will also try and contact Dr Shaaron Adler about possible additional chemotherapy- Dr Prince Rome was CLEAR can use estim, since not cancerous dx.   Decadron tapered  off 12. Impaired function             -Ordered grey call bell for pt.   -still not here, will check again tomorrow.  13.  Potassium- hypokalemia  K+ 3.9 on 7/5.   7/13- K+ 4.0 14. Thrush: Resolved 15. Dysphagia: continue dysphagia 3 thin diet and SLP treatment. 16. ABLA: Hgb 11.7 on 7/5 17. Leukocytosis: Resolved 18. Hypotensive: Stoped Lisinopril: BP drops easily- do not add hypertensives.   Controlled on 7/11 19. Dispo  7/12- will attempt to d/c pt before next Tuesday so can f/u with Dr Shaaron Adler- also , will order teaching of bowel program to family.  7/13- will d/w team to try and d/c by next Tuesday    7/14: Discussed with mom- no foley upon discharge. Patient and mom prefer allowing him to avoid and I/O cath as needed.    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 And needs tilt and recline due to risk of pressure ulcers-   Also needs a hospital bed to make transfers with hoyer lift possible- in regular bed, would not be able to reposition with family's help- need hospital for repositioning, decrease risk of pressure ulcers and transfers.   LOS: 22 days A FACE TO FACE EVALUATION WAS PERFORMED  Martha Clan P Brodey Bonn 05/31/2020, 8:01 PM    [place] PHYSICAL MEDICINE & REHABILITATION PROGRESS NOTE   Subjective/Complaints:   Pt reports doing OK- per PT,  was very sedated- awake, but not verbally responding yesterday afternoon ~ 2-3 pm- per family, was sleepy, which occurs with him- they didn't feel was seizure activity.   Spoke with mother on phone about the plan discussed with Dr Shaaron Adler- she agreed- will plan to d/c early next week with f/u with Dr Shaaron Adler to access possible new medicine at his clinic- with f/u on next Wednesday.     ROS:  Pt  denies SOB, abd pain, CP, N/V/C/D, and vision changes   Objective:   No results found. No results for input(s): WBC, HGB, HCT, PLT in the last 72 hours. Recent Labs    05/29/20 0617  NA 141  K 4.0  CL 105  CO2 23  GLUCOSE 69*  BUN 10  CREATININE <0.30*  CALCIUM 9.3    Intake/Output Summary (Last 24 hours) at 05/31/2020 2001  Last data filed at 05/31/2020 1720 Gross per 24 hour  Intake 250 ml  Output 350 ml  Net -100 ml     Physical Exam: Vital Signs Blood pressure 114/86, pulse 79, temperature (!) 97.4 F (36.3 C), resp. rate 15, height (P) 5\' 10"  (1.778 m), weight 65.6 kg, SpO2 100 %. Constitutional: No distress . Vital signs reviewed. Pt awake, alert, answering questions- brighter affect, NAD HENT: Normocephalic.  Atraumatic. 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  Intact. Psych: bright but only answers questions, doesn't speak spontnaeously Musc: No edema in extremities.  No tenderness in extremities. Neuro: Alert Motor: Limited movements throughout, right upper extremity limited due to tone versus guarding  Assessment/Plan: 1. 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  Physiatrist is providing close team supervision and 24 hour management of active medical problems listed below.  Physiatrist and rehab team continue to assess barriers to discharge/monitor patient progress toward functional and medical goals  Care Tool:  Bathing  Bathing activity did not occur: Safety/medical concerns Body parts bathed by patient: Chest, Abdomen, Front perineal area   Body parts bathed by helper: Right arm, Left arm, Chest, Abdomen, Front perineal area, Buttocks, Right upper leg, Left upper leg, Right lower leg,  Left lower leg, Face Body parts n/a: Right arm, Left arm, Buttocks, Right upper leg, Left upper leg, Right lower leg, Left lower leg, Face   Bathing assist Assist Level: Total Assistance - Patient < 25%     Upper Body Dressing/Undressing Upper body dressing   What is the patient wearing?: Pull over shirt    Upper body assist Assist Level: Total Assistance - Patient < 25%    Lower Body Dressing/Undressing Lower body dressing      What is the patient wearing?: Pants     Lower body assist Assist for lower body dressing: Total Assistance - Patient < 25%     Toileting Toileting Toileting Activity did not occur Landscape architect and hygiene only): N/A (no void or bm)  Toileting assist Assist for toileting: 2 Helpers     Transfers Chair/bed transfer  Transfers assist     Chair/bed transfer assist level: 2 Helpers     Locomotion Ambulation   Ambulation assist   Ambulation activity did not occur: Safety/medical concerns          Walk 10 feet activity   Assist  Walk 10 feet activity did not occur: Safety/medical concerns        Walk 50 feet activity   Assist Walk 50 feet with 2 turns activity did not occur: Safety/medical concerns         Walk 150 feet activity   Assist Walk 150 feet activity did not occur: Safety/medical concerns         Walk 10 feet on uneven surface  activity   Assist Walk 10 feet on uneven surfaces activity did not occur: Safety/medical concerns         Wheelchair     Assist Will patient use wheelchair at discharge?: Yes Type of Wheelchair: Power Wheelchair activity did not occur: Safety/medical concerns  Wheelchair assist level: Minimal Assistance - Patient > 75% Max wheelchair distance: 150'    Wheelchair 50 feet with 2 turns activity    Assist  Wheelchair 50 feet with 2 turns activity did not occur: Safety/medical concerns   Assist Level: Minimal Assistance - Patient > 75%   Wheelchair  150 feet activity     Assist  Wheelchair 150 feet activity did not occur: Safety/medical concerns   Assist Level: Minimal Assistance - Patient > 75%   Blood pressure 114/86, pulse 79, temperature (!) 97.4 F (36.3 C), resp. rate 15, height (P) 5\' 10"  (1.778 m), weight 65.6 kg, SpO2 100 %.  Medical Problem List and Plan: 1.  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  Antibiotic therapy completed 04/28/2020- parents say 6/14- PICC out  Continue CIR  Called later in the day by nursing for family requesting to speak with physician.  Discussed with mother past course, reviewed imaging, conveyed discussion with neurology as well as neurologist and oncologist at [place] all questions.  6/12- spoke with mother at length- she and family don't want to do inpt at [facility] again, ever- willing to see Dr Shaaron Adler- spoke with Dr Shaaron Adler and started Decadron 4 mg BID per his request- will see if can get him discharged, so can travel to see Dr Shaaron Adler next Wednesday 2.  Antithrombotics: -DVT/anticoagulation: Subcutaneous Lovenox.  Vascular study negative for DVT             -antiplatelet therapy: n/a 3. Pain Management: Baclofen 10 mg twice daily for spasticity.   Controlled on 7/11 4. Mood: not on any meds             -antipsychotic agents: n/a 5. Neuropsych: This patient is somewhat capable of making decisions on his own behalf. 6. Skin/Wound Care: no open skin areas- however has an area of irritation near anus- Stage I   Added Eucerin cream for dry feet. 7. Fluids/Electrolytes/Nutrition: regular diet with thin liquids- need SLP for dysarthria/dysphagia and cognition 8.  Neurogenic bowel and bladder.  Suppository nightly as  needed, MiraLAX daily.    Bowel program  7/13- placed order to teach family bowel program- wasn't done last night based on documentation since family left ~ 3pm. 9.  Seizure disorder.  Keppra 1000 mg twice daily, Inderal 10 mg twice daily. Seizure precautions.   Consulted Neurology, appreciate recs  EEG and labs stable, with the exception of leukocytosis.  EEG showed evidence of cerebral dysfunction in the left frontal region and a mild diffuse encephalopathy. No epileptiform discharges or EEG seizures were recorded.  MRI shows increase in size meningiomas and vasogenic edema   Suspected partial seizures with prolonged postictal state may be cause of unresponsive episodes  Follow-up with neurology as outpatient as well as oncologist at [facility]  Discussed with neurology due to repeat episode on 7/11, no changes in recommendation at this time.  Will maintain current dose of antiepileptics.  Will consider addition of secondary agent if frequent repeated occurrences.  7/12- add Decadron 4 mg PO BID  7/13- spoke with mother about plan with Dr Shaaron Adler- she agrees and is happy with plan currently.  10.  Hyponatremia.  Continue sodium chloride tablets 3 times daily.    Sodium 139 on 7/5, labs ordered for tomorrow  7/13- Na 141 11. Neurofibromatosis-2-   -will also try and contact Dr Shaaron Adler about possible additional chemotherapy- Dr Prince Rome was CLEAR can use estim, since not  cancerous dx.   Decadron tapered off 12. Impaired function             -Ordered grey call bell for pt.  13.  Potassium- hypokalemia  K+ 3.9 on 7/5.   7/13- K+ 4.0  Labs ordered for tomorrow 14. Thrush: Resolved 15. Dysphagia: continue dysphagia 3 thin diet and SLP treatment. 16. ABLA: Hgb 11.7 on 7/5  Labs ordered for tomorrow 17. Leukocytosis: Resolved 18. Hypotensive: Stoped Lisinopril: BP drops easily- do not add hypertensives.   Controlled on 7/11 19. Dispo  7/12- will attempt to d/c pt before next Tuesday so can f/u  with Dr Shaaron Adler- also , will order teaching of bowel program to family.  7/13- will d/w team to try and d/c by next Tuesday    I spent a total of 45 minutes on total care today- by speaking with OT, PA and mother on phone about pt's care.    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 And needs tilt and recline due to risk of pressure ulcers-   Also needs a hospital bed to make transfers with hoyer lift possible- in regular bed, would not be able to reposition with family's help- need hospital for repositioning, decrease risk of pressure ulcers and transfers.   LOS: 22 days A FACE TO FACE EVALUATION WAS PERFORMED  Martha Clan P Eleonore Shippee 05/31/2020, 8:01 PM

## 2020-05-31 NOTE — Progress Notes (Signed)
Occupational Therapy Session Note  Patient Details  Name: Cameron Hale [name]: [id number] Date of Birth: [birth year]  Today's Date: 05/31/2020 OT Individual Time: 0900-1005 OT Individual Time Calculation (min): 65 min    Short Term Goals: Week 3:  OT Short Term Goal 1 (Week 3): Pt will perform UB self care with max A. OT Short Term Goal 2 (Week 3): Pt will perform SB transfer with max A  Skilled Therapeutic Interventions/Progress Updates:    Patient in bed, alert and finishing breakfast.  He is dependent for eating but able to direct care.  He denies pain.    Mother present for rest of session.  Completed LB bathing and dressing at bed level - dependent.  Rolling in bed with max A both right and left.  Reviewed and practiced steps for use of manual hoyer from bed to power TIS w/c.  Current bed and TIS w/c height not ideal for manual hoyer and sling available as some lifting (approx 1-2 inches) required to clear surface - discussed potential barriers with parents in preparation for transition home.  Patient remained in w/c at close of session in reclined position, power off, w/c seat belt on, parents present   Therapy Documentation Precautions:  Precautions Precautions: Fall Precaution Comments: seizure precautions Restrictions Weight Bearing Restrictions: No   Therapy/Group: Individual Therapy  Carlos Levering 05/31/2020, 7:35 AM

## 2020-05-31 NOTE — Progress Notes (Signed)
Occupational Therapy Session Note  Patient Details  Name: Cameron Hale [name]: [id number] Date of Birth: 1976-02-24  Today's Date: 05/31/2020 OT Individual Time: 0630-1601 OT Individual Time Calculation (min): 42 min    Short Term Goals: Week 3:  OT Short Term Goal 1 (Week 3): Pt will perform UB self care with max A. OT Short Term Goal 2 (Week 3): Pt will perform SB transfer with max A  Skilled Therapeutic Interventions/Progress Updates:    Upon entering the room, pt seated in power wheelchair. Pt also had new loaner power chair arrive with head controls and staff demonstrating how to use with plans for pt to try out during next PT session. Pt requesting assistance with grooming tasks with max hand over hand assistance to wash face and brush teeth. A/AROM exercises for B UEs x 5 reps in all planes for strenghtening. Pt requesting to remain in chair. All needs within reach and seat belt donned for safety.   Therapy Documentation Precautions:  Precautions Precautions: Fall Precaution Comments: seizure precautions Restrictions Weight Bearing Restrictions: No Vital Signs: Therapy Vitals Temp: 97.6 F (36.4 C) Temp Source: Oral Pulse Rate: 71 Resp: 18 BP: 110/84 Patient Position (if appropriate): Lying Oxygen Therapy SpO2: 99 % O2 Device: Room Air Pain: Pain Assessment Pain Scale: 0-10 Pain Score: 0-No pain   Therapy/Group: Individual Therapy  Gypsy Decant 05/31/2020, 4:22 PM

## 2020-06-01 ENCOUNTER — Inpatient Hospital Stay (HOSPITAL_COMMUNITY): Payer: Medicare Other | Admitting: Physical Therapy

## 2020-06-01 ENCOUNTER — Encounter (HOSPITAL_COMMUNITY): Payer: Medicare Other | Admitting: Speech Pathology

## 2020-06-01 ENCOUNTER — Encounter (HOSPITAL_COMMUNITY): Payer: Medicare Other

## 2020-06-01 ENCOUNTER — Ambulatory Visit (HOSPITAL_COMMUNITY): Payer: Medicare Other | Admitting: Physical Therapy

## 2020-06-01 NOTE — Progress Notes (Signed)
Physical Therapy Session Note  Patient Details  Name: Cameron Hale [name]: [id number] Date of Birth: Sep 11, 1976  Today's Date: 06/01/2020 PT Individual Time: 1000-1100; 1300-1400 PT Individual Time Calculation (min): 60 min and 60 min  Short Term Goals: Week 3:  PT Short Term Goal 1 (Week 3): Pt's family will demonstrate ability to assist patient with transfers with min cueing from therapist PT Short Term Goal 2 (Week 3): Pt will perform power w/c mobility x 150 ft with Supervision PT Short Term Goal 3 (Week 3): Pt will be able to verbalize pressure relief schedule of x 2 min every 30 min  Skilled Therapeutic Interventions/Progress Updates:    Session 1: Pt received seated in bed, agreeable to PT session. No complaints of pain. Pt found to be incontinent of bowel and bladder in his brief. Pt is dependent for pericare at bed level with max A for rolling L/R. Assisted pt with donning TED hose and pants dependently at bed level. Supine to sit with total A for BLE management and trunk control. Slide board transfer bed to power w/c with total A with +2 for safety. Once seated in w/c assisted pt with changing his shirt with assisted needed for leaning anteriorly in w/c as well as threading BUE and head into shirt. Pt left semi-reclined in [place] in room with needs in reach at end of session.  Session 2: Pt received seated in PWC in room with dad present for family education session. No complaints of pain. Pt is min A to drive PWC x 50 ft with increased difficulty utilizing t-handle controller this date due to fatigue. Assisted pt remainder of the way to therapy gym for time conservation. Demonstration with pt's dad how [place] controls of turning chair off/on, driving, pressure relief via tilt and recline, and power elevating leg rest controls. Discussed difference between patient controls and caregiver controls. Pt's dad is able to perform power w/c mobility up to 150 ft at Supervision level with some cues for  safety and control of buttons needed. He is able to perform turns and back w/c up next to the bed safely via use of caregiver joystick on back of wheelchair. Nursing requesting pt return to bed for I/O cath at end of session. Slide board transfer w/c to bed with total A x 2 going uphill into bed. Sit to supine assist x 2 for trunk control and BLE management. Provided handout to pt and his dad with regards to B UE and LE PROM to avoid contracture. Pt left semi-reclined in bed with needs in reach at end of session.  Therapy Documentation Precautions:  Precautions Precautions: Fall Precaution Comments: seizure precautions Restrictions Weight Bearing Restrictions: No   Therapy/Group: Individual Therapy   Excell Seltzer, PT, DPT  06/01/2020, 12:40 PM

## 2020-06-01 NOTE — Progress Notes (Signed)
Occupational Therapy Weekly Progress Note  Patient Details  Name: Cameron Hale [name]: [id number] Date of Birth: 12/22/1975  Beginning of progress report period: May 25, 2020 End of progress report period: June 01, 2020  Patient has met 0 of 2 short term goals.  Progress has been minimal during the past week.  Pt with ongoing medical concerns that has limited active participation with periods of unresponsiveness noted. LTG downgraded (see Care Plan) and focus shifted to family education including bed mobility, use of hoyer lift, self care, and bowel program. Discharge date changed to 7/19. Pt's father, mother, and wife have been present and participated in education.  Patient continues to demonstrate the following deficits: muscle weakness, muscle joint tightness and muscle paralysis, decreased cardiorespiratoy endurance, unbalanced muscle activation, decreased coordination and decreased motor planning and decreased sitting balance, decreased standing balance and decreased postural control and therefore will continue to benefit from skilled OT intervention to enhance overall performance with BADL, iADL and Reduce care partner burden.  Patient not progressing toward long term goals.  See goal revision..  Continue plan of care.  OT Short Term Goals Week 3:  OT Short Term Goal 1 (Week 3): Pt will perform UB self care with max A. OT Short Term Goal 1 - Progress (Week 3): Progressing toward goal OT Short Term Goal 2 (Week 3): Pt will perform SB transfer with max A OT Short Term Goal 2 - Progress (Week 3): Discontinued (comment) Week 4:  OT Short Term Goal 1 (Week 4): STG=LTG secondary to ELOS       Leroy Libman 06/01/2020, 7:58 AM

## 2020-06-01 NOTE — Progress Notes (Signed)
Patient ID: Cameron Hale, male   DOB: 1976-08-30, 44 y.o.   [name]: [id number]  SW met with pt to discuss getting catheters and incontinence supplies. Pt and SW spoke with his mother [name] to discuss further. Pt is unsure if he would like to have catheters at discharge. Pt is amenable to incontinence supplies .  Loralee Pacas, MSW, [place] Office: [phone] Cell: [phone] Fax: [phone]

## 2020-06-01 NOTE — Progress Notes (Signed)
Physical Therapy Weekly Progress Note  Patient Details  Name: Cameron Hale [name]: [id number] Date of Birth: [birth year]  Beginning of progress report period: May 26, 2020 End of progress report period: June 01, 2020  Today's Date: 06/01/2020    Patient has met 2 of 3 short term goals.  Pt is currently at max A level for rolling, total A for supine to/from sit, total A x 2 for SB transfers vs being dependent for transfers via use of lift device, and is at Supervision to min A level for power w/c mobility x 150 ft. Weekly focus on family education with patient's parents and his wife with regards to assisting him with bed mobility, transfers via use of manual hoyer, and power wheelchair mobility. Pt and his family also participated in a seating evaluation this week and [name] continues to trial several types of power wheelchair controls to determine the best fit for him.  Patient continues to demonstrate the following deficits muscle weakness, muscle joint tightness and muscle paralysis, decreased cardiorespiratoy endurance, abnormal tone, unbalanced muscle activation and decreased coordination and decreased sitting balance, decreased postural control and decreased balance strategies and therefore will continue to benefit from skilled PT intervention to increase functional independence with mobility.  Patient progressing toward long term goals..  Continue plan of care.  PT Short Term Goals  Week 3:  PT Short Term Goal 1 (Week 3): Pt's family will demonstrate ability to assist patient with transfers with min cueing from therapist (Met) PT Short Term Goal 2 (Week 3): Pt will perform power w/c mobility x 150 ft with Supervision (Progressing) PT Short Term Goal 3 (Week 3): Pt will be able to verbalize pressure relief schedule of x 2 min every 30 min (Met) Week 4:  PT Short Term Goal 1 (Week 4): =LTG due to ELOS  Skilled Therapeutic Interventions/Progress Updates:  Balance/vestibular  training;Community reintegration;Discharge planning;Disease management/prevention;DME/adaptive equipment instruction;Functional electrical stimulation;Functional mobility training;Neuromuscular re-education;Pain management;Patient/family education;Psychosocial support;Splinting/orthotics;Therapeutic Activities;Therapeutic Exercise;UE/LE Strength taining/ROM;UE/LE Coordination activities;Wheelchair propulsion/positioning   Therapy Documentation Precautions:  Precautions Precautions: Fall Precaution Comments: seizure precautions Restrictions Weight Bearing Restrictions: No   Therapy/Group: Individual Therapy   Excell Seltzer, PT, DPT 06/01/2020, 3:51 PM

## 2020-06-01 NOTE — Progress Notes (Signed)
Speech Language Pathology Daily Session Note  Patient Details  Name: Cameron Hale [name]: [id number] Date of Birth: Dec 19, 1975  Today's Date: 06/01/2020 SLP Individual Time: 1400-1425 SLP Individual Time Calculation (min): 25 min  Short Term Goals: Week 3: SLP Short Term Goal 1 (Week 3): Patient will demonstrate efficient mastication with trials of regular textures with complete oral clearance without overt s/s of aspiration over 2 sessions prior to upgrade with supervision verbal cues. SLP Short Term Goal 2 (Week 3): Patient will utilize diaphramatic breathing at the phrase level with Min A verbal cues to achieve ~90% intelligibility. SLP Short Term Goal 3 (Week 3): Patient will perform 25 repetitions of EMST at 7 cm H2O with Max A multimodal cues with a self-perceived effort level of 7/10 without reports of dizziness, etc. SLP Short Term Goal 4 (Week 3): Patient will perform 25 repetitions of IMST at 15 cm H2O with Max A multimodal cues with a self-perceived effort level of 7/10 without reports of dizziness, etc.  Skilled Therapeutic Interventions: Skilled treatment session focused on speech goals. SLP facilitated session by providing extra time and Min A verbal cues to perform IMST and EMST exercises accurately. Patient performed 25 repetitions of EMST at 6cm H2O with a self-perceived effort level of 8/10 and 25 repetitions of IMST exercises at 15 cm H2O with a self-perceived effort level of 7/10. Patient's father present and had questions about RMT, all questions were answered and a handout was given to reinforce information. Patient requested to use the urinal but was unsuccessful, RN made aware. Patient left upright in bed with alarm on and all needs within reach. Continue with current plan of care.      Pain No/Denies Pain   Therapy/Group: Individual Therapy  Adilynn Bessey 06/01/2020, 3:14 PM

## 2020-06-01 NOTE — Progress Notes (Signed)
Occupational Therapy Session Note  Patient Details  Name: Cameron Hale [name]: [id number] Date of Birth: 1976-11-13  Today's Date: 06/01/2020 OT Individual Time: 1100-1155 OT Individual Time Calculation (min): 55 min    Short Term Goals: Week 4:  OT Short Term Goal 1 (Week 4): STG=LTG secondary to ELOS  Skilled Therapeutic Interventions/Progress Updates:    Pt resting in PWC upon arrival.  OT intervention with focus on BUE AAROM (see below), increased functional use of RUE, and RUE NMR (see below for NMES with NMR). RUE NMR with focus on wrist extension to facilitate self feeding with u-cuff.   1:1 NMES applied RUE wrist extensors to facilitate wrist extension and increase functional use of RUE in self care tasks  Ratio 1:3 Rate 35 pps Waveform- Asymmetric Ramp 1.0 Pulse 300 Intensity- 24 Duration - 10  Report of pain at the beginning of session none Report of pain at the end of session none   Minimal results with NMES  No adverse reactions after treatment and is skin intact.   Pt remained in w/c with all needs within reach and soft call bell attached to arm rest for easier access by patient.  Therapy Documentation Precautions:  Precautions Precautions: Fall Precaution Comments: seizure precautions Restrictions Weight Bearing Restrictions: No    Pain: Pt denies pain this morning  Exercises: General Exercises - Upper Extremity Shoulder Flexion: AAROM;Both;10 reps;Seated Shoulder ABduction: AAROM;Both Shoulder Horizontal ADduction: AAROM;Both Elbow Flexion: AAROM;Both Elbow Extension: AAROM;Both Wrist Flexion: AAROM;Both Wrist Extension: AAROM;Both Digit Composite Flexion: AAROM;Both Hand Exercises Forearm Supination: AAROM;Both Forearm Pronation: AAROM;Both Thumb Abduction: AAROM;Both Thumb Adduction: AAROM;Both   Therapy/Group: Individual Therapy  Leroy Libman 06/01/2020, 12:16 PM

## 2020-06-01 NOTE — Progress Notes (Signed)
[place] PHYSICAL MEDICINE & REHABILITATION PROGRESS NOTE   Subjective/Complaints: Mr. Klinger has no complaints today.  He has grey call bell available but was not aware of this- I explained to him how to use it.  VS stable  ROS:  Pt denies SOB, abd pain, CP, N/V/C/D, and vision changes  Objective:   No results found. No results for input(s): WBC, HGB, HCT, PLT in the last 72 hours. No results for input(s): NA, K, CL, CO2, GLUCOSE, BUN, CREATININE, CALCIUM in the last 72 hours.  Intake/Output Summary (Last 24 hours) at 06/01/2020 1246 Last data filed at 06/01/2020 0800 Gross per 24 hour  Intake 390 ml  Output 301 ml  Net 89 ml     Physical Exam: Vital Signs Blood pressure 121/69, pulse 71, temperature 98.1 F (36.7 C), resp. rate 14, height (P) 5\' 10"  (1.778 m), weight 65.6 kg, SpO2 99 %. General: Alert and oriented x 3, No apparent distress HEENT: Head is normocephalic, atraumatic, PERRLA, EOMI, sclera anicteric, oral mucosa pink and moist, dentition intact, ext ear canals clear,  Neck: Supple without JVD or lymphadenopathy Heart: Reg rate and rhythm. No murmurs rubs or gallops Chest: CTA bilaterally without wheezes, rales, or rhonchi; no distress Abdomen: Soft, non-tender, non-distended, bowel sounds positive. Extremities: No clubbing, cyanosis, or edema. Pulses are 2+ Skin: Warm and dry.  Intact. Psych: bright but only answers questions, doesn't speak spontnaeously Musc: No edema in extremities.  No tenderness in extremities. Neuro: Alert Motor: Limited movements throughout, right upper extremity limited due to tone versus guarding  Assessment/Plan: 1. 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  Physiatrist is providing close team supervision and 24 hour management of active medical problems listed below.  Physiatrist and  rehab team continue to assess barriers to discharge/monitor patient progress toward functional and medical goals  Care Tool:  Bathing  Bathing activity did not occur: Safety/medical concerns Body parts bathed by patient: Chest, Abdomen, Front perineal area   Body parts bathed by helper: Right arm, Left arm, Chest, Abdomen, Front perineal area, Buttocks, Right upper leg, Left upper leg, Right lower leg, Left lower leg, Face Body parts n/a: Right arm, Left arm, Buttocks, Right upper leg, Left upper leg, Right lower leg, Left lower leg, Face   Bathing assist Assist Level: Total Assistance - Patient < 25%     Upper Body Dressing/Undressing Upper body dressing   What is the patient wearing?: Pull over shirt    Upper body assist Assist Level: Total Assistance - Patient < 25%    Lower Body Dressing/Undressing Lower body dressing      What is the patient wearing?: Pants     Lower body assist Assist for lower body dressing: Total Assistance - Patient < 25%     Toileting Toileting Toileting Activity did not occur Landscape architect and hygiene only): N/A (no void or bm)  Toileting assist Assist for toileting: 2 Helpers     Transfers Chair/bed transfer  Transfers assist     Chair/bed transfer assist level: 2 Helpers     Locomotion Ambulation   Ambulation assist   Ambulation activity did not occur: Safety/medical concerns          Walk 10 feet activity   Assist  Walk 10 feet activity did not occur: Safety/medical concerns        Walk 50 feet activity   Assist Walk 50  feet with 2 turns activity did not occur: Safety/medical concerns         Walk 150 feet activity   Assist Walk 150 feet activity did not occur: Safety/medical concerns         Walk 10 feet on uneven surface  activity   Assist Walk 10 feet on uneven surfaces activity did not occur: Safety/medical concerns         Wheelchair     Assist Will patient use wheelchair at  discharge?: Yes Type of Wheelchair: Power Wheelchair activity did not occur: Safety/medical concerns  Wheelchair assist level: Minimal Assistance - Patient > 75% Max wheelchair distance: 150'    Wheelchair 50 feet with 2 turns activity    Assist    Wheelchair 50 feet with 2 turns activity did not occur: Safety/medical concerns   Assist Level: Minimal Assistance - Patient > 75%   Wheelchair 150 feet activity     Assist  Wheelchair 150 feet activity did not occur: Safety/medical concerns   Assist Level: Minimal Assistance - Patient > 75%   Blood pressure 121/69, pulse 71, temperature 98.1 F (36.7 C), resp. rate 14, height (P) 5\' 10"  (1.778 m), weight 65.6 kg, SpO2 99 %.  Medical Problem List and Plan: 1.  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  Antibiotic therapy completed 04/28/2020- parents say 6/14- PICC out  Continue CIR 2.  Antithrombotics: -DVT/anticoagulation: Subcutaneous Lovenox.  Vascular study negative for DVT             -antiplatelet therapy: n/a 3. Pain Management: Baclofen 10 mg twice daily for spasticity.   Controlled 7/15 4. Mood: not on any meds             -antipsychotic agents: n/a 5. Neuropsych: This patient is somewhat capable of making decisions on his own behalf. 6. Skin/Wound Care: no open skin areas- however has an area of irritation near anus- Stage I   Added Eucerin cream for dry feet. 7. Fluids/Electrolytes/Nutrition: regular diet with thin liquids- need SLP for dysarthria/dysphagia and cognition 8.  Neurogenic bowel and bladder.  Suppository nightly as needed, MiraLAX daily.    No bowel program note documented 9.  Seizure disorder.  Keppra 1000 mg twice daily,  Inderal 10 mg twice daily. Seizure precautions.   Consulted Neurology, appreciate recs  EEG and labs stable, with the exception of leukocytosis.  EEG showed evidence of cerebral dysfunction in the left frontal region and a mild diffuse encephalopathy. No epileptiform discharges or EEG seizures were recorded.  MRI shows increase in size meningiomas and vasogenic edema   Suspected partial seizures with prolonged postictal state may be cause of unresponsive episodes  Follow-up with neurology as outpatient as well as oncologist at [facility]  Discussed with neurology due to repeat episode on 7/11, no changes in recommendation at this time.  Will maintain current dose of antiepileptics.  Will consider addition of secondary agent if frequent repeated occurrences.  7/12- add Decadron 4 mg PO BID  7/13- spoke with mother about plan with Dr Shaaron Adler- she agrees and is happy with plan currently.  10.  Hyponatremia.  Continue sodium chloride tablets 3 times daily.    Sodium 139 on 7/5, labs ordered for tomorrow  7/13- Na 141 11. Neurofibromatosis-2-   -will also try and  contact Dr Shaaron Adler about possible additional chemotherapy- Dr Prince Rome was CLEAR can use estim, since not cancerous dx.   Decadron tapered off 12. Impaired function             -Pearline Cables call bed in place- explained to patient how to use it. 13.  Potassium- hypokalemia  K+ 3.9 on 7/5.   7/13- K+ 4.0 14. Thrush: Resolved 15. Dysphagia: continue dysphagia 3 thin diet and SLP treatment. 16. ABLA: Hgb 11.7 on 7/5 17. Leukocytosis: Resolved 18. Hypotensive: Stoped Lisinopril: BP drops easily- do not add hypertensives.   Controlled on 7/11 19. Dispo  7/12- will attempt to d/c pt before next Tuesday so can f/u with Dr Shaaron Adler- also , will order teaching of bowel program to family.  7/13- will d/w team to try and d/c by next Tuesday    7/14: Discussed with mom- no foley upon discharge. Patient and mom prefer allowing him to avoid and I/O cath as  needed.    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 And needs tilt and recline due to risk of pressure ulcers-   Also needs a hospital bed to make transfers with hoyer lift possible- in regular bed, would not be able to reposition with family's help- need hospital for repositioning, decrease risk of pressure ulcers and transfers.   LOS: 23 days A FACE TO FACE EVALUATION WAS PERFORMED  Martha Clan P Tylek Boney 06/01/2020, 12:46 PM    [place] PHYSICAL MEDICINE & REHABILITATION PROGRESS NOTE   Subjective/Complaints:   Pt reports doing OK- per PT,  was very sedated- awake, but not verbally responding yesterday afternoon ~ 2-3 pm- per family, was sleepy, which occurs with him- they didn't feel was seizure activity.   Spoke with mother on phone about the plan discussed with Dr Shaaron Adler- she agreed- will plan to d/c early next week with f/u with Dr Shaaron Adler to access possible new medicine at his clinic- with f/u on next Wednesday.     ROS:  Pt denies SOB, abd pain, CP, N/V/C/D, and vision changes   Objective:   No results found. No results for input(s): WBC, HGB, HCT, PLT in the last 72 hours. No results for input(s): NA, K, CL, CO2, GLUCOSE, BUN, CREATININE, CALCIUM in the last 72 hours.  Intake/Output Summary (Last 24 hours) at 06/01/2020 1246 Last data filed at 06/01/2020 0800 Gross per 24 hour  Intake 390 ml  Output 301 ml  Net 89 ml     Physical Exam: Vital Signs Blood pressure 121/69, pulse 71, temperature 98.1 F (36.7 C), resp. rate 14, height (P) 5\' 10"  (1.778 m), weight 65.6 kg, SpO2 99 %. Constitutional: No distress . Vital signs reviewed. Pt awake, alert, answering questions- brighter affect, NAD HENT: Normocephalic.  Atraumatic. 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  Intact. Psych: bright but only answers questions, doesn't speak spontnaeously Musc: No edema in extremities.  No tenderness in extremities. Neuro: Alert Motor: Limited movements throughout, right upper extremity limited due to tone versus guarding  Assessment/Plan: 1. 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  Physiatrist is providing close team supervision and 24 hour management of active medical problems listed below.  Physiatrist and rehab team continue to assess barriers to discharge/monitor patient progress toward functional and medical goals  Care Tool:  Bathing  Bathing activity did not occur: Safety/medical concerns Body parts bathed by patient: Chest, Abdomen, Front perineal area   Body parts bathed by helper: Right arm, Left arm, Chest, Abdomen, Front perineal area, Buttocks, Right upper leg, Left upper leg, Right lower leg, Left lower leg, Face Body parts n/a: Right arm, Left arm, Buttocks, Right upper leg, Left upper leg, Right lower leg, Left lower leg, Face   Bathing assist Assist Level: Total Assistance - Patient < 25%     Upper Body Dressing/Undressing Upper body dressing   What is the patient wearing?: Pull over shirt    Upper body assist Assist Level: Total Assistance - Patient < 25%    Lower Body Dressing/Undressing Lower body dressing      What is the patient wearing?: Pants     Lower body assist Assist for lower body dressing: Total Assistance - Patient < 25%     Toileting Toileting Toileting Activity did not occur Landscape architect and hygiene only): N/A (no void or bm)  Toileting assist Assist for toileting: 2 Helpers     Transfers Chair/bed  transfer  Transfers assist     Chair/bed transfer assist level: 2 Helpers     Locomotion Ambulation   Ambulation assist   Ambulation activity did not occur: Safety/medical concerns          Walk 10 feet activity   Assist  Walk 10 feet activity did not occur: Safety/medical concerns        Walk 50 feet activity   Assist Walk 50 feet with 2 turns activity did not occur: Safety/medical concerns         Walk 150 feet activity   Assist Walk 150 feet activity did not occur: Safety/medical concerns         Walk 10 feet on uneven surface  activity   Assist Walk 10 feet on uneven surfaces activity did not occur: Safety/medical concerns         Wheelchair     Assist Will patient use wheelchair at discharge?: Yes Type of Wheelchair: Power Wheelchair activity did not occur: Safety/medical concerns  Wheelchair assist level: Minimal Assistance - Patient > 75% Max wheelchair distance: 150'    Wheelchair 50 feet with 2 turns activity    Assist    Wheelchair 50 feet with 2 turns activity did not occur: Safety/medical concerns   Assist Level: Minimal Assistance - Patient > 75%   Wheelchair 150 feet activity     Assist  Wheelchair 150 feet activity did not occur: Safety/medical concerns   Assist Level: Minimal Assistance - Patient > 75%   Blood pressure 121/69, pulse 71, temperature 98.1 F (36.7 C), resp. rate 14, height (P) 5\' 10"  (1.778 m), weight 65.6 kg, SpO2 99 %.  Medical Problem List and Plan: 1.  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  Antibiotic therapy completed 04/28/2020- parents say 6/14- PICC out  Continue CIR  Called later in the day by nursing for  family  requesting to speak with physician.  Discussed with mother past course, reviewed imaging, conveyed discussion with neurology as well as neurologist and oncologist at [place] all questions.  6/12- spoke with mother at length- she and family don't want to do inpt at [facility] again, ever- willing to see Dr Shaaron Adler- spoke with Dr Shaaron Adler and started Decadron 4 mg BID per his request- will see if can get him discharged, so can travel to see Dr Shaaron Adler next Wednesday 2.  Antithrombotics: -DVT/anticoagulation: Subcutaneous Lovenox.  Vascular study negative for DVT             -antiplatelet therapy: n/a 3. Pain Management: Baclofen 10 mg twice daily for spasticity.   Controlled on 7/11 4. Mood: not on any meds             -antipsychotic agents: n/a 5. Neuropsych: This patient is somewhat capable of making decisions on his own behalf. 6. Skin/Wound Care: no open skin areas- however has an area of irritation near anus- Stage I   Added Eucerin cream for dry feet. 7. Fluids/Electrolytes/Nutrition: regular diet with thin liquids- need SLP for dysarthria/dysphagia and cognition 8.  Neurogenic bowel and bladder.  Suppository nightly as needed, MiraLAX daily.    Bowel program  7/13- placed order to teach family bowel program- wasn't done last night based on documentation since family left ~ 3pm. 9.  Seizure disorder.  Keppra 1000 mg twice daily, Inderal 10 mg twice daily. Seizure precautions.   Consulted Neurology, appreciate recs  EEG and labs stable, with the exception of leukocytosis.  EEG showed evidence of cerebral dysfunction in the left frontal region and a mild diffuse encephalopathy. No epileptiform discharges or EEG seizures were recorded.  MRI shows increase in size meningiomas and vasogenic edema   Suspected partial seizures with prolonged postictal state may be cause of unresponsive episodes  Follow-up with neurology as outpatient as well as oncologist at [facility]  Discussed with  neurology due to repeat episode on 7/11, no changes in recommendation at this time.  Will maintain current dose of antiepileptics.  Will consider addition of secondary agent if frequent repeated occurrences.  7/12- add Decadron 4 mg PO BID  7/13- spoke with mother about plan with Dr Shaaron Adler- she agrees and is happy with plan currently.  10.  Hyponatremia.  Continue sodium chloride tablets 3 times daily.    Sodium 139 on 7/5, labs ordered for tomorrow  7/13- Na 141 11. Neurofibromatosis-2-   -will also try and contact Dr Shaaron Adler about possible additional chemotherapy- Dr Prince Rome was CLEAR can use estim, since not cancerous dx.   Decadron tapered off 12. Impaired function             -Ordered grey call bell for pt.  13.  Potassium- hypokalemia  K+ 3.9 on 7/5.   7/13- K+ 4.0  Labs ordered for tomorrow 14. Thrush: Resolved 15. Dysphagia: continue dysphagia 3 thin diet and SLP treatment. 16. ABLA: Hgb 11.7 on 7/5  Labs ordered for tomorrow 17. Leukocytosis: Resolved 18. Hypotensive: Stoped Lisinopril: BP drops easily- do not add hypertensives.   Controlled on 7/11 19. Dispo  7/12- will attempt to d/c pt before next Tuesday so can f/u with Dr Shaaron Adler- also , will order teaching of bowel program to family.  7/13- will d/w team to try and d/c by next Tuesday    I spent a total of 45 minutes on total care today- by speaking with OT, PA and mother on phone about pt's  care.    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 And needs tilt and recline due to risk of pressure ulcers-   Also needs a hospital bed to make transfers with hoyer lift possible- in regular bed, would not be able to reposition with family's help- need hospital for repositioning, decrease risk of  pressure ulcers and transfers.   LOS: 23 days A FACE TO FACE EVALUATION WAS PERFORMED  Clide Deutscher Colie Josten 06/01/2020, 12:46 PM

## 2020-06-02 ENCOUNTER — Inpatient Hospital Stay (HOSPITAL_COMMUNITY): Payer: Medicare Other | Admitting: Physical Therapy

## 2020-06-02 ENCOUNTER — Inpatient Hospital Stay (HOSPITAL_COMMUNITY): Payer: Medicare Other

## 2020-06-02 NOTE — Progress Notes (Signed)
Speech Language Pathology Discharge Summary  Patient Details  Name: Cameron Hale [name]: [id number] Date of Birth: August 01, 1976   Patient has met 3 of 3 long term goals.  Patient to discharge at overall Supervision level.   Reasons goals not met: N/A   Clinical Impression/Discharge Summary: Patient has made functional gains and has met 3 of 3 LTGs this admission. Currently, patient is consuming Dys. 3 textures with thin liquids with minimal overt s/s of aspiration with supervision level verbal cues for use of swallowing compensatory strategies. Patient has been utilizing RMT and and diaphragmatic breathing which has increased his overall speech intelligibility to ~90% at the sentence level. Patient and family education complete. Patient would benefit from f/u SLP services to maximize his speech and swallowing function.   Care Partner:  Caregiver Able to Provide Assistance: Yes     Recommendation:  24 hour supervision/assistance;Home Health SLP  Rationale for SLP Follow Up: Maximize swallowing safety;Maximize functional communication   Equipment: IMST and EMST devices (discharged home with patient)   Reasons for discharge: Discharged from hospital;Treatment goals met   Patient/Family Agrees with Progress Made and Goals Achieved: Yes    [place], [place] 06/02/2020, 3:58 PM

## 2020-06-02 NOTE — Discharge Instructions (Signed)
Inpatient Rehab Discharge Instructions  Cameron Hale Discharge date and time: No discharge date for patient encounter.   Activities/Precautions/ Functional Status: Activity: activity as tolerated Diet: regular diet Wound Care: keep wound clean and dry Functional status:  ___ No restrictions     ___ Walk up steps independently ___ 24/7 supervision/assistance   ___ Walk up steps with assistance ___ Intermittent supervision/assistance  ___ Bathe/dress independently ___ Walk with walker     __x_ Bathe/dress with assistance ___ Walk Independently    ___ Shower independently ___ Walk with assistance    ___ Shower with assistance ___ No alcohol     ___ Return to work/school ________  COMMUNITY REFERRALS UPON DISCHARGE:    Home Health:   PT   OT   ST   SNA                Agency: [facility]    Phone: [phone]    Medical Equipment/Items Ordered: hospital bed, extra large sling, drop arm bedside commode                                                 Agency/Supplier: [facility] [phone]  Medical Equipment/Items Ordered: specialty wheelchair- powerchair                                                 Agency/Supplier: [facility] [phone]  Medical Equipment/Items Ordered: 16 french catheters and incontinence supplies                                                 Agency/Supplier: [employer] [phone]  Special Instructions: No driving smoking or alcohol   My questions have been answered and I understand these instructions. I will adhere to these goals and the provided educational materials after my discharge from the hospital.  Patient/Caregiver Signature _______________________________ Date __________  Clinician Signature _______________________________________ Date __________  Please bring this form and your medication list with you to all your follow-up doctor's appointments.

## 2020-06-02 NOTE — Progress Notes (Signed)
[place] PHYSICAL MEDICINE & REHABILITATION PROGRESS NOTE   Subjective/Complaints: No complaints today.  Requiring IC up to 3 times per day for urinary retention. Has been discussed with patient's mother and she will continue to I/O cath at home.   ROS:  Pt denies SOB, abd pain, CP, N/V/C/D, and vision changes  Objective:   No results found. No results for input(s): WBC, HGB, HCT, PLT in the last 72 hours. No results for input(s): NA, K, CL, CO2, GLUCOSE, BUN, CREATININE, CALCIUM in the last 72 hours.  Intake/Output Summary (Last 24 hours) at 06/02/2020 1141 Last data filed at 06/02/2020 0924 Gross per 24 hour  Intake 568 ml  Output 300 ml  Net 268 ml     Physical Exam: Vital Signs Blood pressure (!) 134/91, pulse 64, temperature 97.8 F (36.6 C), resp. rate 18, height (P) 5\' 10"  (1.778 m), weight 65.6 kg, SpO2 100 %. General: Alert and oriented x 3, No apparent distress HEENT: Head is normocephalic, atraumatic, PERRLA, EOMI, sclera anicteric, oral mucosa pink and moist, dentition intact, ext ear canals clear,  Neck: Supple without JVD or lymphadenopathy Heart: Reg rate and rhythm. No murmurs rubs or gallops Chest: CTA bilaterally without wheezes, rales, or rhonchi; no distress Abdomen: Soft, non-tender, non-distended, bowel sounds positive. Extremities: No clubbing, cyanosis, or edema. Pulses are 2+ Skin: Warm and dry.  Intact. Psych: bright but only answers questions, doesn't speak spontnaeously Musc: No edema in extremities.  No tenderness in extremities. Neuro: Alert Motor: Limited movements throughout, right upper extremity limited due to tone versus guarding   Assessment/Plan: 1. 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  Physiatrist is providing close team supervision and 24 hour management of active medical problems  listed below.  Physiatrist and rehab team continue to assess barriers to discharge/monitor patient progress toward functional and medical goals  Care Tool:  Bathing  Bathing activity did not occur: Safety/medical concerns Body parts bathed by patient: Chest, Abdomen, Front perineal area   Body parts bathed by helper: Right arm, Left arm, Chest, Abdomen, Front perineal area, Buttocks, Right upper leg, Left upper leg, Right lower leg, Left lower leg, Face Body parts n/a: Right arm, Left arm, Buttocks, Right upper leg, Left upper leg, Right lower leg, Left lower leg, Face   Bathing assist Assist Level: Total Assistance - Patient < 25%     Upper Body Dressing/Undressing Upper body dressing   What is the patient wearing?: Pull over shirt    Upper body assist Assist Level: Total Assistance - Patient < 25%    Lower Body Dressing/Undressing Lower body dressing      What is the patient wearing?: Pants     Lower body assist Assist for lower body dressing: Total Assistance - Patient < 25%     Toileting Toileting Toileting Activity did not occur Landscape architect and hygiene only): N/A (no void or bm)  Toileting assist Assist for toileting: 2 Helpers     Transfers Chair/bed transfer  Transfers assist     Chair/bed transfer assist level: 2 Helpers     Locomotion Ambulation   Ambulation assist   Ambulation activity did not occur: Safety/medical concerns          Walk 10 feet activity   Assist  Walk 10 feet activity did not occur: Safety/medical concerns        Walk 50 feet activity  Assist Walk 50 feet with 2 turns activity did not occur: Safety/medical concerns         Walk 150 feet activity   Assist Walk 150 feet activity did not occur: Safety/medical concerns         Walk 10 feet on uneven surface  activity   Assist Walk 10 feet on uneven surfaces activity did not occur: Safety/medical concerns         Wheelchair     Assist  Will patient use wheelchair at discharge?: Yes Type of Wheelchair: Power Wheelchair activity did not occur: Safety/medical concerns  Wheelchair assist level: Minimal Assistance - Patient > 75% Max wheelchair distance: 150'    Wheelchair 50 feet with 2 turns activity    Assist    Wheelchair 50 feet with 2 turns activity did not occur: Safety/medical concerns   Assist Level: Minimal Assistance - Patient > 75%   Wheelchair 150 feet activity     Assist  Wheelchair 150 feet activity did not occur: Safety/medical concerns   Assist Level: Minimal Assistance - Patient > 75%   Blood pressure (!) 134/91, pulse 64, temperature 97.8 F (36.6 C), resp. rate 18, height (P) 5\' 10"  (1.778 m), weight 65.6 kg, SpO2 100 %.  Medical Problem List and Plan: 1.  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  Antibiotic therapy completed 04/28/2020- parents say 6/14- PICC out  Continue CIR 2.  Antithrombotics: -DVT/anticoagulation: Subcutaneous Lovenox.  Vascular study negative for DVT             -antiplatelet therapy: n/a 3. Pain Management: Baclofen 10 mg twice daily for spasticity.   Controlled 7/16 4. Mood: not on any meds             -antipsychotic agents: n/a 5. Neuropsych: This patient is somewhat capable of making decisions on his own behalf. 6. Skin/Wound Care: no open skin areas- however has an area of irritation near anus- Stage I   Added Eucerin cream for dry feet. 7. Fluids/Electrolytes/Nutrition: regular diet with thin liquids- need SLP for dysarthria/dysphagia and cognition 8.  Neurogenic bowel and bladder.  Suppository nightly as needed, MiraLAX daily.    No bowel program note documented- fecal incontinence.    Continue I/O cath for urinary retention. Discussed with mom and she will continue for patient at home.  9.  Seizure disorder.  Keppra 1000 mg twice daily, Inderal 10 mg twice daily. Seizure precautions.   Consulted Neurology, appreciate recs  EEG and labs stable, with the exception of leukocytosis.  EEG showed evidence of cerebral dysfunction in the left frontal region and a mild diffuse encephalopathy. No epileptiform discharges or EEG seizures were recorded.  MRI shows increase in size meningiomas and vasogenic edema   Suspected partial seizures with prolonged postictal state may be cause of unresponsive episodes  Follow-up with neurology as outpatient as well as oncologist at [facility]  Discussed with neurology due to repeat episode on 7/11, no changes in recommendation at this time.  Will maintain current dose of antiepileptics.  Will consider addition of secondary agent if frequent repeated occurrences.  7/12- add Decadron 4 mg PO BID  7/13- spoke with mother about plan with Dr Shaaron Adler- she agrees and is happy with plan currently.  10.  Hyponatremia.  Continue sodium chloride tablets  3 times daily.    Sodium 139 on 7/5, labs ordered for tomorrow  7/13- Na 141 11. Neurofibromatosis-2-   -will also try and contact Dr Shaaron Adler about possible additional chemotherapy- Dr Prince Rome was CLEAR can use estim, since not cancerous dx.   Decadron tapered off 12. Impaired function             -Pearline Cables call bed in place- explained to patient how to use it. 13.  Potassium- hypokalemia  K+ 3.9 on 7/5.   7/13- K+ 4.0 14. Thrush: Resolved 15. Dysphagia: continue dysphagia 3 thin diet and SLP treatment. 16. ABLA: Hgb 11.7 on 7/5 17. Leukocytosis: Resolved 18. Hypotensive: Stoped Lisinopril: BP drops easily- do not add hypertensives.   8/29: diastolic has been slightly elevated but systolic is well controlled. Continue to monitor.  64. Dispo  7/12- will attempt to d/c pt before next Tuesday so can f/u with Dr  Shaaron Adler- also , will order teaching of bowel program to family.  7/13- will d/w team to try and d/c by next Tuesday    7/14: Discussed with mom- no foley upon discharge. Patient and mom prefer allowing him to avoid and I/O cath as needed.    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 And needs tilt and recline due to risk of pressure ulcers-   Also needs a hospital bed to make transfers with hoyer lift possible- in regular bed, would not be able to reposition with family's help- need hospital for repositioning, decrease risk of pressure ulcers and transfers.   LOS: 24 days A FACE TO FACE EVALUATION WAS PERFORMED  Martha Clan P Trever Streater 06/02/2020, 11:41 AM    [place] PHYSICAL MEDICINE & REHABILITATION PROGRESS NOTE   Subjective/Complaints:   Pt reports doing OK- per PT,  was very sedated- awake, but not verbally responding yesterday afternoon ~ 2-3 pm- per family, was sleepy, which occurs with him- they didn't feel was seizure activity.   Spoke with mother on phone about the plan discussed with Dr Shaaron Adler- she agreed- will plan to d/c early next week with f/u with Dr Shaaron Adler to access possible new medicine at his clinic- with f/u on next Wednesday.     ROS:  Pt denies SOB, abd pain, CP, N/V/C/D, and vision changes   Objective:   No results found. No results for input(s): WBC, HGB, HCT, PLT in the last 72 hours. No results for input(s): NA, K, CL, CO2, GLUCOSE, BUN, CREATININE, CALCIUM in the last 72 hours.  Intake/Output Summary (Last 24 hours) at 06/02/2020 1141 Last data filed at 06/02/2020 0924 Gross per 24 hour  Intake 568 ml  Output 300 ml  Net 268 ml     Physical Exam: Vital Signs Blood pressure (!) 134/91, pulse 64, temperature 97.8 F  (36.6 C), resp. rate 18, height (P) 5\' 10"  (1.778 m), weight 65.6 kg, SpO2 100 %. Constitutional: No distress . Vital signs reviewed. Pt awake, alert, answering questions- brighter affect, NAD HENT: Normocephalic.  Atraumatic. 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  Intact. Psych: bright but only answers questions, doesn't speak spontnaeously Musc: No  edema in extremities.  No tenderness in extremities. Neuro: Alert Motor: Limited movements throughout, right upper extremity limited due to tone versus guarding  Assessment/Plan: 1. 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  Physiatrist is providing close team supervision and 24 hour management of active medical problems listed below.  Physiatrist and rehab team continue to assess barriers to discharge/monitor patient progress toward functional and medical goals  Care Tool:  Bathing  Bathing activity did not occur: Safety/medical concerns Body parts bathed by patient: Chest, Abdomen, Front perineal area   Body parts bathed by helper: Right arm, Left arm, Chest, Abdomen, Front perineal area, Buttocks, Right upper leg, Left upper leg, Right lower leg, Left lower leg, Face Body parts n/a: Right arm, Left arm, Buttocks, Right upper leg, Left upper leg, Right lower leg, Left lower leg, Face   Bathing assist Assist Level: Total Assistance - Patient < 25%     Upper Body Dressing/Undressing Upper body dressing   What is the patient wearing?: Pull over shirt    Upper body assist Assist Level: Total Assistance - Patient < 25%    Lower Body Dressing/Undressing Lower body dressing      What is the patient wearing?: Pants     Lower body assist Assist for lower body  dressing: Total Assistance - Patient < 25%     Toileting Toileting Toileting Activity did not occur Landscape architect and hygiene only): N/A (no void or bm)  Toileting assist Assist for toileting: 2 Helpers     Transfers Chair/bed transfer  Transfers assist     Chair/bed transfer assist level: 2 Helpers     Locomotion Ambulation   Ambulation assist   Ambulation activity did not occur: Safety/medical concerns          Walk 10 feet activity   Assist  Walk 10 feet activity did not occur: Safety/medical concerns        Walk 50 feet activity   Assist Walk 50 feet with 2 turns activity did not occur: Safety/medical concerns         Walk 150 feet activity   Assist Walk 150 feet activity did not occur: Safety/medical concerns         Walk 10 feet on uneven surface  activity   Assist Walk 10 feet on uneven surfaces activity did not occur: Safety/medical concerns         Wheelchair     Assist Will patient use wheelchair at discharge?: Yes Type of Wheelchair: Power Wheelchair activity did not occur: Safety/medical concerns  Wheelchair assist level: Minimal Assistance - Patient > 75% Max wheelchair distance: 150'    Wheelchair 50 feet with 2 turns activity    Assist    Wheelchair 50 feet with 2 turns activity did not occur: Safety/medical concerns   Assist Level: Minimal Assistance - Patient > 75%   Wheelchair 150 feet activity     Assist  Wheelchair 150 feet activity did not occur: Safety/medical concerns   Assist Level: Minimal Assistance - Patient > 75%   Blood pressure (!) 134/91, pulse 64, temperature 97.8 F (36.6 C), resp. rate 18, height (P) 5\' 10"  (1.778 m), weight 65.6 kg, SpO2 100 %.  Medical Problem List and Plan: 1.  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  Antibiotic therapy completed 04/28/2020- parents say 6/14- PICC out  Continue CIR  Called later in the day by nursing for family requesting to speak with physician.  Discussed with mother past course, reviewed imaging, conveyed discussion with neurology as well as neurologist and oncologist at [place] all questions.  6/12- spoke with mother at length- she and family don't want to do inpt at [facility] again, ever- willing to see Dr Shaaron Adler- spoke with Dr Shaaron Adler and started Decadron 4 mg BID per his request- will see if can get him discharged, so can travel to see Dr Shaaron Adler next Wednesday 2.  Antithrombotics: -DVT/anticoagulation: Subcutaneous Lovenox.  Vascular study negative for DVT             -antiplatelet therapy: n/a 3. Pain Management: Baclofen 10 mg twice daily for spasticity.   Controlled on 7/11 4. Mood: not on any meds             -antipsychotic agents: n/a 5. Neuropsych: This patient is somewhat capable of making decisions on his own behalf. 6. Skin/Wound Care: no open skin areas- however has an area of irritation near anus- Stage I   Added Eucerin cream for dry feet. 7. Fluids/Electrolytes/Nutrition: regular diet with thin liquids- need SLP for dysarthria/dysphagia and cognition 8.  Neurogenic bowel and bladder.  Suppository nightly as needed, MiraLAX daily.    Bowel program  7/13- placed order to teach family bowel program- wasn't done last night based on documentation since family left ~ 3pm. 9.  Seizure disorder.  Keppra 1000 mg twice daily, Inderal 10 mg twice daily. Seizure precautions.   Consulted Neurology, appreciate recs  EEG and labs stable, with the exception of leukocytosis.  EEG showed evidence of cerebral dysfunction in the left frontal region and a mild diffuse encephalopathy. No epileptiform discharges or EEG seizures were  recorded.  MRI shows increase in size meningiomas and vasogenic edema   Suspected partial seizures with prolonged postictal state may be cause of unresponsive episodes  Follow-up with neurology as outpatient as well as oncologist at [facility]  Discussed with neurology due to repeat episode on 7/11, no changes in recommendation at this time.  Will maintain current dose of antiepileptics.  Will consider addition of secondary agent if frequent repeated occurrences.  7/12- add Decadron 4 mg PO BID  7/13- spoke with mother about plan with Dr Shaaron Adler- she agrees and is happy with plan currently.  10.  Hyponatremia.  Continue sodium chloride tablets 3 times daily.    Sodium 139 on 7/5, labs ordered for tomorrow  7/13- Na 141 11. Neurofibromatosis-2-   -will also try and contact Dr Shaaron Adler about possible additional chemotherapy- Dr Prince Rome was CLEAR can use estim, since not cancerous dx.   Decadron tapered off 12. Impaired function             -Ordered grey call bell for pt.  13.  Potassium- hypokalemia  K+ 3.9 on 7/5.   7/13- K+ 4.0  Labs ordered for tomorrow 14. Thrush: Resolved 15. Dysphagia: continue dysphagia 3 thin diet and SLP treatment. 16. ABLA: Hgb 11.7 on 7/5  Labs ordered for tomorrow 17. Leukocytosis: Resolved 18. Hypotensive: Stoped Lisinopril: BP drops easily- do not add hypertensives.   Controlled on 7/11 19. Dispo  7/12- will attempt to d/c pt before next Tuesday so can f/u with Dr Shaaron Adler- also , will order teaching of bowel  program to family.  7/13- will d/w team to try and d/c by next Tuesday    I spent a total of 45 minutes on total care today- by speaking with OT, PA and mother on phone about pt's care.    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 And needs tilt and recline due to risk of pressure ulcers-   Also needs a hospital bed to make transfers with hoyer lift possible- in regular bed, would not be able to reposition with family's help- need hospital for repositioning, decrease risk of pressure ulcers and transfers.   LOS: 24 days A FACE TO FACE EVALUATION WAS PERFORMED  Clide Deutscher Rucha Wissinger 06/02/2020, 11:41 AM

## 2020-06-02 NOTE — Progress Notes (Signed)
Physical Therapy Session Note  Patient Details  Name: Cameron Hale [name]: [id number] Date of Birth: [birth year]  Today's Date: 06/02/2020 PT Individual Time: 1300-1345 PT Individual Time Calculation (min): 45 min   Short Term Goals: Week 4:  PT Short Term Goal 1 (Week 4): =LTG due to ELOS  Skilled Therapeutic Interventions/Progress Updates:    Pt received seated in power w/c in room eating lunch with assist from NT. This therapist takes over assisting pt with eating his lunch. Pt is dependent for eating mashed potatoes, orange sherbet, and drinking iced tea. Assisted pt with changing his shirt while seated in chair. Pt is total A for anterior leaning and dependent for doffing shirt and donning a new shirt. Pt set up with mini joystick controller on power wheelchair. Assisted pt with trialing out mini joystick controller with use of RUE. Pt is unable to adequately and effectively steer the wheelchair with use of mini joystick. Attempted to build up controller for improved control but controller unable to operative effectively with addition of built-up controls. Also attempted to adjust placement of controller for improved reach with use of pt's RUE, again unable to effectively propel mini joystick. ATP contacted and will switch controller back to t-handle as pt has had the most success propelling the w/c with this control type so far. Pt left semi-reclined in power w/c in room with needs in reach, quick release belt and chair alarm in place at end of session.  Therapy Documentation Precautions:  Precautions Precautions: Fall Precaution Comments: seizure precautions Restrictions Weight Bearing Restrictions: No    Therapy/Group: Individual Therapy   Excell Seltzer, PT, DPT  06/02/2020, 2:45 PM

## 2020-06-02 NOTE — Progress Notes (Signed)
Physical Therapy Discharge Summary  Patient Details  Name: Cameron Hale [name]: [id number] Date of Birth: 19-Feb-1976   Patient has met 3 of 6 long term goals due to improved activity tolerance, improved balance, improved postural control, increased strength and ability to compensate for deficits.  Patient to discharge at a wheelchair level [place] for [place] mobility, max to total A for bed mobility, and dependent for transfers via use of manual hoyer lift.   Patient's care partner is independent to provide the necessary physical assistance at discharge. Patient's parents and wife have completed hands-on family education and are safe to assist pt with bed mobility, transfers via use of manual hoyer lift, and power wheelchair mobility upon d/c home.  Reasons goals not met: Patient did not meet bed mobility goal of max A as he can require up to total A for supine to/from sit transfers. Pt did not meet Supervision level for power wheelchair mobility as he continues to require up to min A at times due to RUE fatigue and weakness. Patient's family is able to provide the level of assist he will require upon d/c home.  Recommendation:  Patient will benefit from ongoing skilled PT services in home health setting to continue to advance safe functional mobility, address ongoing impairments in strength, endurance, ROM, safety, independence with functional mobility, independence with power wheelchair mobility, and minimize fall risk.  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) Patient Visual Report: No change  from baseline Perception Perception: Within Functional Limits Praxis Praxis: Intact  Cognition Overall Cognitive Status: Within Functional Limits for tasks assessed Arousal/Alertness: Awake/alert Orientation Level: Oriented X4 Attention: Focused Focused Attention: Appears intact Memory: Appears intact Awareness: Appears intact Problem Solving: Appears intact Safety/Judgment: Appears intact Sensation Sensation Light Touch: Impaired Detail Light Touch Impaired Details: Impaired LLE;Impaired RLE Proprioception: Impaired Detail Proprioception Impaired Details: Impaired RLE;Impaired LLE Coordination Gross Motor Movements are Fluid and Coordinated: No Fine Motor Movements are Fluid and Coordinated: No Coordination and Movement Description: impaired 2/2 incomplete quadraplegia Motor  Motor Motor: Tetraplegia;Abnormal tone;Abnormal postural alignment and control Motor - Skilled Clinical Observations: impaired 2/2 incomplete quadraplegia Motor - Discharge Observations: impaired 2/2 incomlete quadraplegia  Mobility Bed Mobility Bed Mobility: Rolling Right;Rolling Left;Supine to Sit;Sit to Supine Rolling Right: Maximal Assistance - Patient 25-49% Rolling Left: Maximal Assistance - Patient 25-49% Supine to Sit: Total Assistance - Patient < 25% Sit to Supine: Total Assistance - Patient < 25% Transfers Transfers: Lateral/Scoot Human resources officer via Arts development officer Transfers: 2 Press photographer (Assistive device): Other (Comment) (slide board) Transfer via Lift Equipment: Occupational psychologist Ambulation: No Gait Gait: No Stairs / Additional Locomotion Stairs: No Architect: Yes Wheelchair Assistance: Minimal assistance - Patient >75% Environmental health practitioner: Power Wheelchair Parts Management: Needs assistance Distance: 150  Trunk/Postural Assessment  Cervical Assessment Cervical Assessment: Exceptions to [facility] (forward head;  decreased ROM) Thoracic Assessment Thoracic Assessment: Exceptions to [facility] (kyphotic) Lumbar Assessment Lumbar Assessment: Exceptions to [facility] (posterior pelvic tilt) Postural Control Postural Control: Deficits on evaluation  Balance Balance Balance Assessed: Yes Static Sitting Balance Static Sitting - Balance Support: No upper extremity supported;Feet supported Static Sitting - Level of Assistance: 3: Mod assist Dynamic Sitting Balance Dynamic  Sitting - Balance Support: No upper extremity supported;Feet supported;During functional activity Dynamic Sitting - Level of Assistance: 2: Max assist Extremity Assessment   RLE Assessment RLE Assessment: Exceptions to [facility] Passive Range of Motion (PROM) Comments: tight HS and gastroc General Strength Comments: impaired, see below RLE Strength Right Hip Flexion: 1/5 Right Knee Flexion: 2-/5 Right Knee Extension: 2-/5 Right Ankle Dorsiflexion: 0/5 LLE Assessment LLE Assessment: Exceptions to [facility] Passive Range of Motion (PROM) Comments: tight HS and gastroc General Strength Comments: impaired, see below LLE Strength Left Hip Flexion: 1/5 Left Knee Flexion: 0/5 Left Knee Extension: 0/5 Left Ankle Dorsiflexion: 0/5     Excell Seltzer, PT, DPT 06/05/2020, 8:09 AM

## 2020-06-02 NOTE — Progress Notes (Signed)
Due to patient's chronic urinary retention he will need to perform clean intermittent catheterization up to 3 times per day with a straight tip cath 16 [place] as will medium size briefs due to fecal incontinence

## 2020-06-02 NOTE — Progress Notes (Signed)
Physical Therapy Session Note  Patient Details  Name: Cameron Hale [name]: [id number] Date of Birth: 1976-09-17  Today's Date: 06/02/2020 PT Individual Time: 7062-3762 PT Individual Time Calculation (min): 65 min   Short Term Goals: Week 4:  PT Short Term Goal 1 (Week 4): =LTG due to ELOS  Skilled Therapeutic Interventions/Progress Updates:    Pt received sitting in power wheelchair with his wife present and pt agreeable to therapy session. Pt/wife report that he does not like the current mini-joystick controller reporting it is much more difficult for him to control power w/c - Lovena Le, primary PT aware. Therapist educated pt's wife on how to perform pressure relief in the current loaner power w/c and she demonstrated understanding. Educated on performing a full minute of pressure relief every 30 minutes and to utilize that time to also perform PROM of extremities as has been provided by primary PT. NT present reporting pt has had incontinent episode. L lateral scoot power w/c>EOB (utilized elevating feature of power w/c to create level transfer with Kreg bed) - mod assist of 1 person for trunk support and lifting LE while 2nd person placed transfer board total assist - total assist scooting with +2 present for safety. Sit>supine with +2 total assist for trunk descent and B LE management onto bed. Rolling R/L max assist while +2 performed total assist LB clothing management and peri-care (incontinent of bladder). Pt requesting to use tilt feature of bed and working on standing.  Tolerated 47degree upright tilt for ~15 minutes and while in that position focused on:  - B LE weightbearing for tone management and B LE stretching - noted to have significant B LE ankle PF tightness/contractures (primarily gastroc shortening as when knees flexed able to get heels more flat on footboard) - required assistance for placing LEs to avoid ankle inversion/supination - midline orientation with manual facilitation to  correct R trunk lean - once in midline able to sustain trunk position with intermittent assist - BLE strengthening with progression to performing assisted mini-squats 2x5 reps with pt having increased R>L quad muscle activation but able to activate both to perform full knee extension with only minimal assistance - R UE bicep stretch x1 minute as noted to lack full elbow extension  - L UE bicep curls x10reps active assisted Positioned back into supine in bed. Pt reports feeling urge to urinate - total assist LB clothing and urinal management - continent of small amount - NT arrived and aware. Positioned supine in bed with towel rolls at hips to prevent excessive hip external rotation - NT present to assume care of patient and perform bladder scan.  Therapy Documentation Precautions:  Precautions Precautions: Fall Precaution Comments: seizure precautions Restrictions Weight Bearing Restrictions: No  Pain:   Denies pain during session.  Therapy/Group: Individual Therapy  Tawana Scale , PT, DPT, CSRS  06/02/2020, 3:00 PM

## 2020-06-02 NOTE — Progress Notes (Signed)
Patient ID: Cameron Hale, male   DOB: 1976-01-13, 44 y.o.   [name]: [id number]  SW met with pt in room to follow-up about if he would like to d/c with the use of catheters. Pt is now agreeable.   SW faxed orders to [employer] [phone].  Loralee Pacas, MSW, [place] Office: [phone] Cell: [phone] Fax: [phone]

## 2020-06-02 NOTE — Progress Notes (Signed)
Occupational Therapy Session Note  Patient Details  Name: Cameron Hale [name]: [id number] Date of Birth: 03-20-1976  Today's Date: 06/02/2020 OT Individual Time: 3358-2518 OT Individual Time Calculation (min): 75 min    Short Term Goals: Week 4:  OT Short Term Goal 1 (Week 4): STG=LTG secondary to ELOS  Skilled Therapeutic Interventions/Progress Updates:    Pt resting in bed upon arrival.  Pt incontinent of bladder and pt stated he was aware but did not initiate request to change brief. OT intervention with focus on bed mobility, SB transfers, grooming seated at sink, and BUE AROM tasks. Max A for rolling R/L in bed to facilitate changing brief and donning pants. Tot A for supine>sit EOB in preparation for SB transfer to w/c.  SB tranfser with tot A+2. Washing face and brushing teeth with max A. Tot A for repositioning in PWC. Pt engaged in table tasks with focus on RUE elbow extension to facilitate controlling joy stick on PWC. Stall Equipment tech changed out controls on PWC but did not have time for pt to practice with revised setup.  Relayed info to PT. Pt remained in w/c with Stall tech present.  NT [place] notified of pt's current status.    Therapy Documentation Precautions:  Precautions Precautions: Fall Precaution Comments: seizure precautions Restrictions Weight Bearing Restrictions: No   Pain: Pain Assessment Pain Scale: 0-10 Pain Score: 0-No pain   Therapy/Group: Individual Therapy  Leroy Libman 06/02/2020, 11:09 AM

## 2020-06-02 NOTE — Progress Notes (Signed)
Pt continues to have loose BM multiple times during the day. Pt would like to not have suppository given due to the fact he is having so many loose/ large BMs. Pt educated on bowel program and states he understands. Family states they will be here Saturday evening to participate in bowel program training. ( They were not present today but father told RN on Thursday).

## 2020-06-03 MED ORDER — SACCHAROMYCES BOULARDII 250 MG PO CAPS
250.0000 mg | ORAL_CAPSULE | Freq: Two times a day (BID) | ORAL | Status: DC
  Administered 2020-06-03 – 2020-06-05 (×5): 250 mg via ORAL
  Filled 2020-06-03 (×5): qty 1

## 2020-06-03 NOTE — Progress Notes (Signed)
Occupational Therapy Session Note  Patient Details  Name: Cameron Hale [name]: [id number] Date of Birth: June 21, 1976  Today's Date: 06/03/2020 OT Individual Time: 3875-6433 OT Individual Time Calculation (min): 45 min    Short Term Goals: Week 4:  OT Short Term Goal 1 (Week 4): STG=LTG secondary to ELOS  Skilled Therapeutic Interventions/Progress Updates:    Pt asleep in bed upon arrival and easily aroused although pt required constant stimulation to keep eyes open.  OT intervention with focus on bed mobility and sitting balance. Max A for rolling R/L in bed using bed rails. Tot A for sidelying>sitting EOB with min/mod A for static sitting balance.  Pt required max A for dynamic sitting balance. BUE AAROM for shoulder flexion and elbow extension. Pt returned to supine with tot A and repositioned in bed. Pt immediately closed his eyes when returned to supine.  Bed alarm activated and all needs within reach.   Therapy Documentation Precautions:  Precautions Precautions: Fall Precaution Comments: seizure precautions Restrictions Weight Bearing Restrictions: No Pain: Pain Assessment Pain Scale: 0-10 Pain Score: 0-No pain   Therapy/Group: Individual Therapy  Leroy Libman 06/03/2020, 11:17 AM

## 2020-06-03 NOTE — Plan of Care (Signed)
°  Problem: Consults Goal: RH SPINAL CORD INJURY PATIENT EDUCATION Description:  See Patient Education module for education specifics.  Outcome: Progressing Goal: Skin Care Protocol Initiated - if Braden Score 18 or less Description: If consults are not indicated, leave blank or document N/A Outcome: Progressing   Problem: SCI BOWEL ELIMINATION Goal: RH STG MANAGE BOWEL WITH ASSISTANCE Description: STG Manage Bowel with max to total Assistance. Outcome: Progressing Goal: RH STG SCI MANAGE BOWEL WITH MEDICATION WITH ASSISTANCE Description: STG SCI Manage bowel with medication with max to total assistance. Outcome: Progressing Goal: RH STG SCI MANAGE BOWEL PROGRAM W/ASSIST OR AS APPROPRIATE Description: STG SCI Manage bowel program with max to total assist or as appropriate. Outcome: Progressing   Problem: SCI BLADDER ELIMINATION Goal: RH STG MANAGE BLADDER WITH ASSISTANCE Description: STG Manage Bladder With max Assistance Outcome: Progressing Goal: RH STG SCI MANAGE BLADDER PROGRAM W/ASSISTANCE Description: Manage bladder program with max assistance. Outcome: Progressing   Problem: RH SKIN INTEGRITY Goal: RH STG SKIN FREE OF INFECTION/BREAKDOWN Description: Skin to remain free from breakdown while on rehab with mod assistance. Outcome: Progressing Goal: RH STG MAINTAIN SKIN INTEGRITY WITH ASSISTANCE Description: STG Maintain Skin Integrity With mod Assistance. Outcome: Progressing Goal: RH STG ABLE TO PERFORM INCISION/WOUND CARE W/ASSISTANCE Description: STG Able To Perform Incision/Wound Care With mod Assistance. Outcome: Progressing   Problem: RH SAFETY Goal: RH STG ADHERE TO SAFETY PRECAUTIONS W/ASSISTANCE/DEVICE Description: STG Adhere to Safety Precautions With mod Assistance and appropriate assistive Device. Outcome: Progressing   Problem: RH PAIN MANAGEMENT Goal: RH STG PAIN MANAGED AT OR BELOW PT'S PAIN GOAL Description: <4 on a 0-10 pain scale. Outcome:  Progressing   Problem: RH KNOWLEDGE DEFICIT SCI Goal: RH STG INCREASE KNOWLEDGE OF SELF CARE AFTER SCI Description: Patient and caregiver will be able to demonstrate medication management, bowel program, bladder program, safety precautions, skin care, and follow up care with the MD with min assist from CIR staff. Outcome: Progressing

## 2020-06-03 NOTE — Progress Notes (Addendum)
[name] PHYSICAL MEDICINE & REHABILITATION PROGRESS NOTE   Subjective/Complaints: Had questions about therapy this weekend. Doesn't like hospital food  ROS: Patient denies fever, rash, sore throat, blurred vision, nausea, vomiting, diarrhea, cough, shortness of breath or chest pain,   headache, or mood change.   Objective:   No results found. No results for input(s): WBC, HGB, HCT, PLT in the last 72 hours. No results for input(s): NA, K, CL, CO2, GLUCOSE, BUN, CREATININE, CALCIUM in the last 72 hours.  Intake/Output Summary (Last 24 hours) at 06/03/2020 1138 Last data filed at 06/03/2020 0844 Gross per 24 hour  Intake 690 ml  Output 650 ml  Net 40 ml     Physical Exam: Vital Signs Blood pressure (!) 137/93, pulse 68, temperature (!) 97.5 F (36.4 C), temperature source Oral, resp. rate 18, height (P) 5\' 10"  (1.778 m), weight 65.6 kg, SpO2 98 %. Constitutional: No distress . Vital signs reviewed. HEENT: EOMI, oral membranes moist Neck: supple Cardiovascular: RRR without murmur. No JVD    Respiratory/Chest: CTA Bilaterally without wheezes or rales. Normal effort    GI/Abdomen: BS +, non-tender, non-distended Ext: no clubbing, cyanosis, or edema Psych: pleasant and cooperative Skin: Warm and dry.  Intact. Psych: flat, cooperative though Musc: No edema in extremities.  No tenderness in extremities. Neuro: Alert Motor: tetraplegic, left sided hypertonia. Sensory loss below shoulders   Assessment/Plan: 1. 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  Physiatrist is providing close team supervision and 24 hour management of active medical problems listed below.  Physiatrist and rehab team continue to assess barriers to discharge/monitor patient progress toward functional and medical goals  Care Tool:  Bathing  Bathing activity did  not occur: Safety/medical concerns Body parts bathed by patient: Chest, Abdomen, Front perineal area   Body parts bathed by helper: Right arm, Left arm, Face, Left lower leg, Right lower leg, Left upper leg, Right upper leg, Buttocks Body parts n/a: Right arm, Left arm, Buttocks, Right upper leg, Left upper leg, Right lower leg, Left lower leg, Face   Bathing assist Assist Level: Total Assistance - Patient < 25%     Upper Body Dressing/Undressing Upper body dressing   What is the patient wearing?: Pull over shirt    Upper body assist Assist Level: Total Assistance - Patient < 25%    Lower Body Dressing/Undressing Lower body dressing      What is the patient wearing?: Pants     Lower body assist Assist for lower body dressing: Total Assistance - Patient < 25%     Toileting Toileting Toileting Activity did not occur Landscape architect and hygiene only): N/A (no void or bm)  Toileting assist Assist for toileting: 2 Helpers     Transfers Chair/bed transfer  Transfers assist     Chair/bed transfer assist level: 2 Helpers (+2 total assist slide board)     Locomotion Ambulation   Ambulation assist   Ambulation activity did not occur: Safety/medical concerns          Walk 10 feet activity   Assist  Walk 10 feet activity did not occur: Safety/medical concerns        Walk 50 feet activity   Assist Walk 50 feet with 2 turns activity did not occur: Safety/medical concerns         Walk 150 feet activity   Assist Walk 150 feet activity did not  occur: Safety/medical concerns         Walk 10 feet on uneven surface  activity   Assist Walk 10 feet on uneven surfaces activity did not occur: Safety/medical concerns         Wheelchair     Assist Will patient use wheelchair at discharge?: Yes Type of Wheelchair: Power Wheelchair activity did not occur: Safety/medical concerns  Wheelchair assist level: Minimal Assistance - Patient >  75% Max wheelchair distance: 150'    Wheelchair 50 feet with 2 turns activity    Assist    Wheelchair 50 feet with 2 turns activity did not occur: Safety/medical concerns   Assist Level: Minimal Assistance - Patient > 75%   Wheelchair 150 feet activity     Assist  Wheelchair 150 feet activity did not occur: Safety/medical concerns   Assist Level: Minimal Assistance - Patient > 75%   Blood pressure (!) 137/93, pulse 68, temperature (!) 97.5 F (36.4 C), temperature source Oral, resp. rate 18, height (P) 5\' 10"  (1.778 m), weight 65.6 kg, SpO2 98 %.  Medical Problem List and Plan: 1.  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  Antibiotic therapy completed 04/28/2020- parents say 6/14- PICC out  Continue CIR 2.  Antithrombotics: -DVT/anticoagulation: Subcutaneous Lovenox.  Vascular study negative for DVT             -antiplatelet therapy: n/a 3. Pain Management: Baclofen 10 mg twice daily for spasticity.   Controlled 7/16 4. Mood: not on any meds             -antipsychotic agents: n/a 5. Neuropsych: This patient is somewhat capable of making decisions on his own behalf. 6. Skin/Wound Care: no open skin areas- however has an area of irritation near anus- Stage I   Added Eucerin cream for dry feet. 7. Fluids/Electrolytes/Nutrition: regular diet with thin liquids- need SLP for dysarthria/dysphagia and cognition 8.  Neurogenic bowel and bladder.       7/17 continue nightly suppository   -STOP miralax and senna-s for now as stools mushy   -begin florastor to help with stool conistency   -family/pt ed  Continue I/O cath for urinary retention. Discussed with mom and she will continue for patient at home.  9.   Seizure disorder.  Keppra 1000 mg twice daily, Inderal 10 mg twice daily. Seizure precautions.   Consulted Neurology, appreciate recs  EEG and labs stable, with the exception of leukocytosis.  EEG showed evidence of cerebral dysfunction in the left frontal region and a mild diffuse encephalopathy. No epileptiform discharges or EEG seizures were recorded.  MRI shows increase in size meningiomas and vasogenic edema   Suspected partial seizures with prolonged postictal state may be cause of unresponsive episodes  Follow-up with neurology as outpatient as well as oncologist at [facility]  Discussed with neurology due to repeat episode on 7/11, no changes in recommendation at this time.  Will maintain current dose of antiepileptics.  Will consider addition of secondary agent if frequent repeated occurrences.  7/12- add Decadron 4 mg PO BID  7/13- spoke with mother about plan with Dr Shaaron Adler- she agrees and is happy with plan currently.  10.  Hyponatremia.  Continue sodium chloride tablets 3 times daily.    Sodium 139 on 7/5, labs ordered for tomorrow  7/13- Na 141 11. Neurofibromatosis-2-   -will also try and contact Dr Shaaron Adler about possible additional chemotherapy- Dr Prince Rome was CLEAR can use estim, since not cancerous dx.   Decadron tapered off 12. Impaired function             -Pearline Cables call bed in place- explained to patient how to use it. 13.  Potassium- hypokalemia  K+ 3.9 on 7/5.   7/13- K+ 4.0 14. Thrush: Resolved 15. Dysphagia: continue dysphagia 3 thin diet and SLP treatment. 16. ABLA: Hgb 11.7 on 7/5 17. Leukocytosis: Resolved 18. Hypotensive: Stoped Lisinopril: BP drops easily- do not add hypertensives.   0/80: diastolic has been slightly elevated but systolic is well controlled. Continue to monitor.  88. Dispo  7/12- will attempt to d/c pt before next Tuesday so can f/u with Dr Shaaron Adler- also , will order teaching of bowel program to family.  7/13- will d/w team to try and d/c by next  Tuesday    7/14: Discussed with mom- no foley upon discharge. Patient and mom prefer allowing him to avoid and I/O cath as needed.    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 And needs tilt and recline due to risk of pressure ulcers-   Also needs a hospital bed to make transfers with hoyer lift possible- in regular bed, would not be able to reposition with family's help- need hospital for repositioning, decrease risk of pressure ulcers and transfers.   LOS: 25 days A FACE TO FACE EVALUATION WAS PERFORMED  Cameron Hale 06/03/2020, 11:38 AM

## 2020-06-03 NOTE — Progress Notes (Signed)
Given report from night shift nurse that patient would have family coming to unit for patient education following discharge on Monday. Patient's wife arrived to unit. Asked if she was here for family education and wife stated "No, but I can be, his dad was supposed to be here since he is going home with him". Explained the procedure for the bowel program and self catheterization. Wife did not participate in procedure, wife sat and watched nurse. Offered wife to jump in but decline and scrolled on cell phone. Explained to wife that it was imperative to particiate and ask any questions before patient is to be discharged. Explained the procedures again and asked the wife to teach back instructions. Amanda Cockayne, LPN

## 2020-06-03 NOTE — Progress Notes (Signed)
Occupational Therapy Discharge Summary  Patient Details  Name: Cameron Hale [name]: [id number] Date of Birth: 1976-01-01  Patient has met 4 of 8 long term goals  Pt progress with BADLs and functional transfers was minimal during this admission. Pt continued to experience medical issues which impacted his progress.  LTGs were downgraded during this admission. Pt requires max A for bed mobility using bed rails. Pt requires max A for sitting balance in preparation for SB transfers to w/c. Pt requires max A for bathing tasks and tot A for dressing tasks. SB tranfsers with tot A +2. Family has been trained on use of manual hoyer and is independent with it's use. Patient to discharge at overall max to total care level.  Patient's care partner is independent to provide the necessary physical and cognitive assistance at discharge.    Reasons goals not met: Again due to multimedical issues. Pt's cognition waxed and weaned through his stay and minimal return occurred for function. Pt continued to required significant amount of care.  Recommendation:  Patient will benefit from ongoing skilled OT services in home health setting to continue to advance functional skills in the area of BADL and Reduce care partner burden.  Equipment: drop arm BSC, hospital bed hoyer lift   Reasons for discharge: discharge from hospital  Patient/family agrees with progress made and goals achieved: Yes  OT Discharge Vision Baseline Vision/History: Legally blind (legally blind L eye) Patient Visual Report: No change from baseline Vision Assessment?: Vision impaired- to be further tested in functional context Perception  Perception: Within Functional Limits Praxis Praxis: Intact Cognition Overall Cognitive Status: Within Functional Limits for tasks assessed Arousal/Alertness: Lethargic Orientation Level: Oriented to person;Oriented to place;Oriented to situation Attention: Focused Focused Attention: Appears  intact Memory: Appears intact Awareness: Appears intact Problem Solving: Impaired Problem Solving Impairment: Functional basic Safety/Judgment: Appears intact Sensation Sensation Light Touch: Impaired by gross assessment Light Touch Impaired Details: Absent RLE;Impaired LLE Proprioception: Impaired Detail Proprioception Impaired Details: Impaired RLE;Impaired LLE Coordination Gross Motor Movements are Fluid and Coordinated: No Fine Motor Movements are Fluid and Coordinated: No Coordination and Movement Description: impaired 2/2 incomplete quadraplegia Motor  Motor Motor: Tetraplegia;Abnormal tone;Abnormal postural alignment and control Motor - Skilled Clinical Observations: impaired 2/2 incomplete quadraplegia    Trunk/Postural Assessment  Cervical Assessment Cervical Assessment:  (forward head) Thoracic Assessment Thoracic Assessment:  (kyphotic) Lumbar Assessment Lumbar Assessment:  (posterior pelvic tilt) Postural Control Postural Control: Deficits on evaluation  Balance Static Sitting Balance Static Sitting - Balance Support: No upper extremity supported;Feet supported Static Sitting - Level of Assistance: 2: Max assist Dynamic Sitting Balance Dynamic Sitting - Balance Support: No upper extremity supported;Feet supported;During functional activity Dynamic Sitting - Level of Assistance: 2: Max assist Extremity/Trunk Assessment RUE Assessment RUE Assessment: Exceptions to [facility] Passive Range of Motion (PROM) Comments: WFL elbow and wrist; ~100 shoulder flexion General Strength Comments: 2+/5 overall LUE Assessment LUE Assessment: Exceptions to [facility] Passive Range of Motion (PROM) Comments: WFL at elbow and wrist; ~100 shoulder flexion General Strength Comments: 2+/5 overall   Leroy Libman 06/03/2020, 7:04 AM

## 2020-06-04 NOTE — Progress Notes (Signed)
Patients mother and father arrived to unit to visit patient. Went to ask if they were present for the family education since they were not present for education on 06/03/20, patient's mother stated "I wasn't aware of the education yesterday". Informed parents that teachings could be done again to be sure they are prepared for pts discharge on Monday 06/05/20. Pts mother replied " we have already had some kind of teaching about a week ago, his father has already been taught how to assist with the self caths". Nurse offered to reeducation , pts mother had questions about the bowel program. Offered again to reeducate family on both the bowel program and self cath. Pts mother stated " I can't stay I have to run to the store and I have a funeral to attended". Pts father stayed in the room during lunch. Offered to reeducate once more but insisted they have been educated the previous week. Educational handouts have been given to father. Amanda Cockayne, LPN

## 2020-06-04 NOTE — Discharge Summary (Signed)
Physician Discharge Summary  Patient ID: Cameron Hale [name]: [id number] DOB/AGE: 02/25/1976 44 y.o.  Admit date: 05/09/2020 Discharge date: 06/05/2020  Discharge Diagnoses:  Principal Problem:   Quadriplegia [facility]) Active Problems:   NF2 (neurofibromatosis 2) ([place])   Incomplete paraplegia ([place])   Acute blood loss anemia   Hypokalemia   Hyponatremia   Seizures ([place])   Neurogenic bowel DVT prophylaxis Neurogenic bladder Dysphagia  Discharged Condition: Stable  Significant Diagnostic Studies: EEG  Result Date: 05/17/2020 Alexis Goodell, MD     05/17/2020  4:09 PM ELECTROENCEPHALOGRAM REPORT Patient: Cameron Hale       Room #: 3G99M EEG No. ID: [id number] Age: 44 y.o.        Sex: male Requesting Physician: Lovorn Report Date:  05/17/2020       Interpreting Physician: Alexis Goodell History: Cameron Hale is an 44 y.o. male with altered mental status Medications: Keppra, Baclofen, Decadron, Diflucan, Inderal Conditions of Recording:  This is a 21 channel routine scalp EEG performed with bipolar and monopolar montages arranged in accordance to the international 10/20 system of electrode placement. One channel was dedicated to EKG recording. The patient is in the awake and asleep states. Description:  At the initiation of the recording the background has the appearance of normal stage two sleep with the background consisting of slow irregular activity that is diffusely distributed with superimposed symmetrical sleep spindles and vertex central sharp transients.  The patient awakens towards the end of the recording and although there is a predominacne of muscle and movement artifact a posterior background rhythm can be seen at 8-9 Hz alpha.  No epileptiform activity is noted. Hyperventilation and intermittent photic stimulation were not performed. IMPRESSION: Normal electroencephalogram, awake, asleep. There are no focal lateralizing or epileptiform features. Alexis Goodell, MD Neurology  [phone] 05/17/2020, 3:50 PM   MR BRAIN W WO CONTRAST  Result Date: 05/24/2020 CLINICAL DATA:  44 year old male with type 2 neurofibromatosis, Meningiomatosis, vestibular schwannoma. Multiple prior meningioma resections. Prior gamma knife. Seizure disorder. EXAM: MRI HEAD WITHOUT AND WITH CONTRAST TECHNIQUE: Multiplanar, multiecho pulse sequences of the brain and surrounding structures were obtained without and with intravenous contrast. CONTRAST:  49mL GADAVIST GADOBUTROL 1 MMOL/ML IV SOLN COMPARISON:  [facility] Brain MRI and cervical spine MRI 01/26/2020 and earlier. FINDINGS: Brain: No restricted diffusion or evidence of acute infarction. No ventriculomegaly. Basilar cistern patency is stable, including partial effacement of the prepontine cistern on the left. No acute intracranial hemorrhage identified. Occasional scattered chronic blood products appears stable on SWI. Chronic Meningiomatosis. The dominant central para falcine meningioma is less enhancing compared to prior studies. And overall size is slightly smaller (from 42 to 39 mm diameter since November). Extensive en plaque type meningiomas along both superior convexities redemonstrated. Those in the midline and over the right convexity appears stable since 2020, but that over the left convexity on series 13, image 16 today has increased and is now up to 11 mm in thickness (versus 9-10 mm in March and 7-8 mm in November). Anterior bifrontal en plaque meningioma involvement also has mildly progressed since November, with up to 10 mm bilateral meningioma thickness now (series 14, image 9 on the left) versus 7 mm or less in November. 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 Subsequently, confluent bilateral frontal lobe vasogenic edema has mildly increased since March, including adjacent to the left frontal horn and at the anterior corpus callosum on series 6,  image 15 today (compare to series 8, image 29 and March). On thin slice coronal T2 images the right hippocampal formation appears slightly larger and more T2 hyperintense compared to the left (series 9, image 16) which may be new since priors. Vascular: Major intracranial vascular flow voids are stable. 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 The torcula, transverse and sigmoid sinuses are enhancing and appear to remain patent. Skull and upper cervical spine: Chronic nodular spinal cord enhancement at C1-C2 appears stable since March. Stable visible bone marrow signal. Sinuses/Orbits: There appears to be new herniation of the bilateral gyrus rectus into the sella turcica (series 9, image 28). But no associated parenchymal edema. The pituitary has been chronically difficult to delineate from the infiltrative cavernous sinus meningioma, and is also not clearly identified today. Subsequent mild mass effect suspected on the pre chiasmatic optic nerves, but intraorbital soft tissues otherwise appear stable. Paranasal sinuses and mastoids are stable and well pneumatized. Other: Stable enhancing right vestibular schwannoma relatively sparing the cerebellopontine angle (series 12, image 16). No acute scalp soft tissue finding identified. IMPRESSION: 1. Extensive Meningiomatosis in keeping with NF-2. Slowly enlarging en plaque type meningiomas along both anterior and superior cerebral convexities. Associated mildly increased vasogenic edema in the frontal lobes and anterior corpus callosum since March, greater on the left. And partial herniation of the posterior rectus gyri into the sella turcica (series 9, image 28), new since November. Meanwhile, the dominant central para falcine meningioma is less enhancing and appears slightly smaller since November. 2. Questionable asymmetric T2 hyperintensity and enlargement of the right hippocampus. 3. Stable right vestibular schwannoma. Chronic enhancing  intramedullary C1-C2 spinal cord lesion appears stable. 4. Negative for acute infarct. Electronically Signed   By: Genevie Ann M.D.   On: 05/24/2020 13:57   Overnight EEG with video  Result Date: 05/23/2020 Corwin Levins, DO     05/23/2020  9:37 AM CPT/Type of Study: 66440; 24hr EEG with video Recording date: 05/22/20 21:24 - 05/23/20 09:24 Interpreting Physician: Izora Ribas, DO History: This is a 44 year old patient, undergoing an EEG to evaluate for seizures. Technical Description: The EEG was performed using standard setting per the guidelines of American Clinical Neurophysiology Society (ACNS). A minimum of 21 electrodes were placed on scalp according to the International 10-20 or/and 10-10 Systems. Supplemental electrodes were placed as needed. Single EKG electrode was also used to detect cardiac arrhythmia. Patient's behavior was continuously recorded on video simultaneously with EEG. A minimum of 16 channels were used for data display. Each epoch of study was reviewed manually daily and as needed using standard referential and bipolar montages. Computerized quantitative EEG analysis (such as compressed spectral array analysis, trending, automated spike & seizure detection) were used as indicated. Clinical State: Awake and asleep Background: The posterior dominant rhythm was recorded at a rate of 8Hz , symmetric, waxing and waning, and reduced by eye opening Overall Amplitude: Normal Predominant Frequency: Alpha Asymmetry: Yes Sleep background: Stage II Abnormalities: Intermittent slow left frontal and generalized Rhythmic or periodic pattern: None Epileptiform activity: No Electrographic Seizure: No Events: No Breach rhythm: No Reactivity: Yes Stimulation procedures: Hyperventilation: Not done Photic stimulation: Not done Impression: -slow left frontal and generalized This EEG shows evidence of cerebral dysfunction in the left frontal region and a mild diffuse encephalopathy. No epileptiform discharges or EEG  seizures were recorded.   VAS [place] LOWER EXTREMITY VENOUS (DVT)  Result Date: 05/10/2020  Lower Venous DVTStudy Indications: Edema.  Comparison Study: no prior Performing Technologist: Jinny Blossom  [facility] RVS  Examination Guidelines: A complete evaluation includes B-mode imaging, spectral Doppler, color Doppler, and power Doppler as needed of all accessible portions of each vessel. Bilateral testing is considered an integral part of a complete examination. Limited examinations for reoccurring indications may be performed as noted. The reflux portion of the exam is performed with the patient in reverse Trendelenburg.  +---------+---------------+---------+-----------+----------+--------------+ RIGHT    CompressibilityPhasicitySpontaneityPropertiesThrombus Aging +---------+---------------+---------+-----------+----------+--------------+ CFV      Full           Yes      Yes                                 +---------+---------------+---------+-----------+----------+--------------+ SFJ      Full                                                        +---------+---------------+---------+-----------+----------+--------------+ FV Prox  Full                                                        +---------+---------------+---------+-----------+----------+--------------+ FV Mid   Full                                                        +---------+---------------+---------+-----------+----------+--------------+ FV DistalFull                                                        +---------+---------------+---------+-----------+----------+--------------+ PFV      Full                                                        +---------+---------------+---------+-----------+----------+--------------+ POP      Full           Yes      Yes                                 +---------+---------------+---------+-----------+----------+--------------+ PTV      Full                                                         +---------+---------------+---------+-----------+----------+--------------+ PERO     Full                                                        +---------+---------------+---------+-----------+----------+--------------+   +---------+---------------+---------+-----------+----------+--------------+  LEFT     CompressibilityPhasicitySpontaneityPropertiesThrombus Aging +---------+---------------+---------+-----------+----------+--------------+ CFV      Full           Yes      Yes                                 +---------+---------------+---------+-----------+----------+--------------+ SFJ      Full                                                        +---------+---------------+---------+-----------+----------+--------------+ FV Prox  Full                                                        +---------+---------------+---------+-----------+----------+--------------+ FV Mid   Full                                                        +---------+---------------+---------+-----------+----------+--------------+ FV DistalFull                                                        +---------+---------------+---------+-----------+----------+--------------+ PFV      Full                                                        +---------+---------------+---------+-----------+----------+--------------+ POP      Full           Yes      Yes                                 +---------+---------------+---------+-----------+----------+--------------+ PTV      Full                                                        +---------+---------------+---------+-----------+----------+--------------+ PERO     Full                                                        +---------+---------------+---------+-----------+----------+--------------+     Summary: BILATERAL: - No evidence of deep vein thrombosis seen in the lower  extremities, bilaterally. -No evidence of popliteal cyst, bilaterally.   *See table(s) above for measurements and observations. Electronically signed by Curt Jews MD on 05/10/2020 at 3:39:53 PM.  Final     Labs:  Basic Metabolic Panel: Recent Labs  Lab 05/29/20 0617  NA 141  K 4.0  CL 105  CO2 23  GLUCOSE 69*  BUN 10  CREATININE <0.30*  CALCIUM 9.3    CBC: No results for input(s): WBC, NEUTROABS, HGB, HCT, MCV, PLT in the last 168 hours.  CBG: No results for input(s): GLUCAP in the last 168 hours.  Family history.  Unobtainable  Brief HPI:   Cameron Hale is a 44 y.o. right-handed male with history of neurofibromatosis type II followed by Dr. Prince Rome neurosurgery at [facility], seizure disorder, hard of hearing, legally blind on the left.  Multiple prior surgeries for meningioma neurofibroma resection.  Per chart review lives with wife.  Modified independent prior to admission using bilateral AFOs.  Wife is currently limited physically.  His parents plan to assist on discharge.  He has a local sister with good support.  Plans to be discharged home with his parents.  1 level home with ramped entrance.  Per report patient had been going for chemotherapy every 3 weeks prior to hospitalization reducing size of neurofibromas followed by Dr. Shaaron Adler. patient with recent C5-7 PCF 02/04/2020 for resection of dural based spinal tumor he was initially discharged to inpatient rehab services 3/53/2992 complicated by CSF leak returning back to the hospital requiring wound revision on 02/20/2020 who was discharged to inpatient rehab services/[facility] on 02/22/2020 and experienced decline with fever as well as speech difficulties as well as lower extremity weakness on 02/29/2020 and was readmitted to the neurosurgical floor due to concern for subclinical seizure and altered mental status he was transferred to the ICU maintained on Keppra.  He also remained on Decadron protocol.  Patient was later  transferred back to the neurosurgical floor with seizures controlled mental status continued to wax and wane.  He remained on triple antibiotic therapy for vancomycin cefepime and Flagyl completed 04/28/2020 all antibiotics discontinued.  Hospital course complicated by hyponatremia maintained on sodium chloride tablets latest sodium 136.  Patient neurogenic bowel bladder working to establish bowel program.  Subcutaneous heparin for DVT prophylaxis.  Tolerating a regular diet.   Hospital Course: Cameron Hale was admitted to rehab 05/09/2020 for inpatient therapies to consist of PT, ST and OT at least three hours five days a week. Past admission physiatrist, therapy team and rehab RN have worked together to provide customized collaborative inpatient rehab.  Pertaining to patient's decrease in functional mobility C4 [place] B/C myelopathy with neurogenic bowel and bladder mild spasticity secondary to neurofibromatosis type II multiple prior surgeries for meningioma neurofibroma resection most recent resection of dural based spinal tumor 03/13/8340 complicated by CSF leak and wound revision.  He completed course of antibiotic therapy.  He is followed by Dr. Prince Rome of neurosurgery at [facility].  Subcutaneous Lovenox for DVT prophylaxis venous Doppler studies negative.  Pain management spasticity with the use of baclofen.  Noted bouts of suspect seizure EEG x2 negative and his Keppra has been increased to 1500 mg twice a day and remained on Inderal 10 mg twice daily.  MRI showed increase in size meningioma and vasogenic edema.  Suspect partial seizure with prolonged postictal state may have been causing episodes of unresponsiveness.  Discussed with [facility] placed on Decadron therapy.  He would follow-up with Dr.Strowd awaiting plan for ongoing chemotherapy.  Neurogenic bowel and bladder in and out catheterizations for urinary retention.  His MiraLAX and Senokot had been stopped due to some loose  stools he  did continue with the Dulcolax suppository.  He did have bouts of hypotension he had been on lisinopril that had been stopped and monitored.  Hyponatremia stabilized 141.   Blood pressures were monitored on TID basis and soft and monitored   Rehab course: During patient's stay in rehab weekly team conferences were held to monitor patient's progress, set goals and discuss barriers to discharge. At admission, patient required total assist mobility as well as ADLs  Physical exam.  Blood pressure 110/70 pulse 80 temperature 98 respirations 18 Constitutional.  Nontoxic appearance no distress HEENT Head.  Normocephalic atraumatic Eyes.  Patient blind left eye Neck.  Supple nontender no JVD without thyromegaly Cardiac regular rate rhythm no murmur or extra sounds Respiratory effort normal no respiratory distress without wheeze Abdomen.  Soft nontender positive bowel sounds without rebound Musculoskeletal no rigidity Right upper extremity biceps 4 -/5 wrist extension 2 -/5 triceps 1/5 grip 2 -/5 finger abduction 1/5 Left upper extremity biceps 3/5 wrist extension to minus/5 triceps 2 -/5 grip 1/5 finger abduction 1/5 Right lower extremity hip flexion knee extension dorsiflexion plantarflexion are 1/5 Left lower extremity hip flexion 2 -/5 otherwise 0 out of 5 in left lower extremity Sensation to light touch impaired from C5 downwards  He/  has had improvement in activity tolerance, balance, postural control as well as ability to compensate for deficits. He/ has had improvement in functional use RUE/LUE  and RLE/LLE as well as improvement in awareness.  Ongoing family teaching educated family on pressure relief.  Left lateral scoot power wheelchair edge of bed utilize elevating feature of wheelchair to create level transfer with Kreg bed.  Moderate assist of 1 person for trunk support and lifting.  Lower extremity while second person place transfer board total assist for scooting.  Supine to sit with  +2 total assist for trunk descent and bilateral lower extremity management onto bed.  Rolling right to left max assist.  Total assist lower body clothing and urinal management.  Slide board transfers grooming seated at sink and bilateral upper extremity active range of motion tasks.  Max assist for rolling right to left in bed to facilitate changing briefs and donning pants during ADLs.  Total assist for supine to sit edge of bed in preparation for sliding board transfers to wheelchair.  Sliding board transfers with total assist for Occupational Therapy.  Washing face and brushing teeth with max assist.  Ongoing family teaching completed and plan discharged home with parents       Disposition: Discharge disposition: 01-Home or Self Care     Discharge to home   Diet: Mechanical soft  Special Instructions: No driving smoking or alcohol  Patient follow-up Dr. Atilano Ina as well as Dr. Boris Sharper for ongoing care at [facility] in and out bladder catheterizations as directed  Medications at discharge 1.  Tylenol as needed 2.  Baclofen 10 mg p.o. twice daily 3.  Dulcolax suppository daily 4.  Decadron 4 mg every 12 hours 5.  Pepcid 20 mg twice daily 6.  Keppra 1500 mg p.o. twice daily 7.  Inderal 10 mg p.o. twice daily    30-35 minutes were spent completing discharge summary and discharge planning     Follow-up Information    Lovorn, Jinny Blossom, MD Follow up.   Specialty: Physical Medicine and Rehabilitation Why: Office to call for appointment Contact information: [address] [place] [address] [phone]        Powers,  Sheppard Coil, MD Follow up.   Specialty: Neurosurgery Why: Call for appointment Contact information: [place] [address] [phone]        Vance Gather, MD Follow up.   Specialty: Neurology Why: call for appointment Contact information: [place]  [address] [phone]               Signed: Lavon Paganini [place] 06/05/2020, 4:55 AM

## 2020-06-04 NOTE — Progress Notes (Signed)
[facility] PHYSICAL MEDICINE & REHABILITATION PROGRESS NOTE   Subjective/Complaints: No new complaints today. LPN reported no problems last night. Pt comfortable  ROS: Limited due to cognitive/behavioral    Objective:   No results found. No results for input(s): WBC, HGB, HCT, PLT in the last 72 hours. No results for input(s): NA, K, CL, CO2, GLUCOSE, BUN, CREATININE, CALCIUM in the last 72 hours.  Intake/Output Summary (Last 24 hours) at 06/04/2020 1121 Last data filed at 06/04/2020 1013 Gross per 24 hour  Intake 462 ml  Output 700 ml  Net -238 ml     Physical Exam: Vital Signs Blood pressure (!) 132/102, pulse 67, temperature 97.8 F (36.6 C), resp. rate 15, height (P) 5\' 10"  (1.778 m), weight 65.6 kg, SpO2 98 %. Constitutional: No distress . Vital signs reviewed. HEENT: EOMI, oral membranes moist Neck: supple Cardiovascular: RRR without murmur. No JVD    Respiratory/Chest: CTA Bilaterally without wheezes or rales. Normal effort    GI/Abdomen: BS +, non-tender, non-distended Ext: no clubbing, cyanosis, or edema Psych: pleasant and cooperative Skin: Warm and dry.  Intact. Musc: No edema in extremities.  No tenderness in extremities. Neuro: Alert, cooperative Motor: tetraplegic, left sided hypertonia. Sensory loss below shoulders   Assessment/Plan: 1. 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  Physiatrist is providing close team supervision and 24 hour management of active medical problems listed below.  Physiatrist and rehab team continue to assess barriers to discharge/monitor patient progress toward functional and medical goals  Care Tool:  Bathing  Bathing activity did not occur: Safety/medical concerns Body parts bathed by patient: Chest, Abdomen, Front perineal area   Body parts bathed by helper: Right arm, Left arm,  Face, Left lower leg, Right lower leg, Left upper leg, Right upper leg, Buttocks Body parts n/a: Right arm, Left arm, Buttocks, Right upper leg, Left upper leg, Right lower leg, Left lower leg, Face   Bathing assist Assist Level: Total Assistance - Patient < 25%     Upper Body Dressing/Undressing Upper body dressing   What is the patient wearing?: Pull over shirt    Upper body assist Assist Level: Total Assistance - Patient < 25%    Lower Body Dressing/Undressing Lower body dressing      What is the patient wearing?: Pants     Lower body assist Assist for lower body dressing: Total Assistance - Patient < 25%     Toileting Toileting Toileting Activity did not occur Landscape architect and hygiene only): N/A (no void or bm)  Toileting assist Assist for toileting: 2 Helpers     Transfers Chair/bed transfer  Transfers assist     Chair/bed transfer assist level: 2 Helpers (+2 total assist slide board)     Locomotion Ambulation   Ambulation assist   Ambulation activity did not occur: Safety/medical concerns          Walk 10 feet activity   Assist  Walk 10 feet activity did not occur: Safety/medical concerns        Walk 50 feet activity   Assist Walk 50 feet with 2 turns activity did not occur: Safety/medical concerns         Walk 150 feet activity   Assist Walk 150 feet activity did not occur: Safety/medical concerns         Walk 10 feet on uneven surface  activity   Assist Walk 10  feet on uneven surfaces activity did not occur: Safety/medical concerns         Wheelchair     Assist Will patient use wheelchair at discharge?: Yes Type of Wheelchair: Power Wheelchair activity did not occur: Safety/medical concerns  Wheelchair assist level: Minimal Assistance - Patient > 75% Max wheelchair distance: 150'    Wheelchair 50 feet with 2 turns activity    Assist    Wheelchair 50 feet with 2 turns activity did not occur:  Safety/medical concerns   Assist Level: Minimal Assistance - Patient > 75%   Wheelchair 150 feet activity     Assist  Wheelchair 150 feet activity did not occur: Safety/medical concerns   Assist Level: Minimal Assistance - Patient > 75%   Blood pressure (!) 132/102, pulse 67, temperature 97.8 F (36.6 C), resp. rate 15, height (P) 5\' 10"  (1.778 m), weight 65.6 kg, SpO2 98 %.  Medical Problem List and Plan: 1.  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  Antibiotic therapy completed 04/28/2020- parents say 6/14- PICC out  Continue CIR  -potential dc tomorrow. Family seems to be somewhat evasive with recommended training 2.  Antithrombotics: -DVT/anticoagulation: Subcutaneous Lovenox.  Vascular study negative for DVT             -antiplatelet therapy: n/a 3. Pain Management: Baclofen 10 mg twice daily for spasticity.   Controlled 7/16 4. Mood: not on any meds             -antipsychotic agents: n/a 5. Neuropsych: This patient is somewhat capable of making decisions on his own behalf. 6. Skin/Wound Care: no open skin areas- however has an area of irritation near anus- Stage I   Added Eucerin cream for dry feet. 7. Fluids/Electrolytes/Nutrition: regular diet with thin liquids- need SLP for dysarthria/dysphagia and cognition 8.  Neurogenic bowel and bladder.       7/17 continue nightly suppository   -STOPPED miralax and senna-s for now as stools mushy   -begin florastor to help with stool conistency   -family/pt ed  Continue I/O cath for urinary retention.    7/18 stool a little more formed last night 9.  Seizure disorder.  Keppra 1000 mg twice daily, Inderal 10 mg twice daily. Seizure precautions.   Consulted  Neurology, appreciate recs  EEG and labs stable, with the exception of leukocytosis.  EEG showed evidence of cerebral dysfunction in the left frontal region and a mild diffuse encephalopathy. No epileptiform discharges or EEG seizures were recorded.  MRI shows increase in size meningiomas and vasogenic edema   Suspected partial seizures with prolonged postictal state may be cause of unresponsive episodes  Follow-up with neurology as outpatient as well as oncologist at [facility]  Discussed with neurology due to repeat episode on 7/11, no changes in recommendation at this time.  Will maintain current dose of antiepileptics.  Will consider addition of secondary agent if frequent repeated occurrences.  7/12- add Decadron 4 mg PO BID  7/13- spoke with mother about plan with Dr Shaaron Adler- she agrees and is happy with plan currently.  10.  Hyponatremia.  Continue sodium chloride tablets 3 times daily.    Sodium 139 on 7/5, labs ordered for tomorrow  7/13- Na 141 11. Neurofibromatosis-2-   -will also try and contact Dr Shaaron Adler about  possible additional chemotherapy- Dr Prince Rome was CLEAR can use estim, since not cancerous dx.   Decadron tapered off 12. Impaired function             -Pearline Cables call bed in place- explained to patient how to use it. 13.  Potassium- hypokalemia  K+ 3.9 on 7/5.   7/13- K+ 4.0 14. Thrush: Resolved 15. Dysphagia: continue dysphagia 3 thin diet and SLP treatment. 16. ABLA: Hgb 11.7 on 7/5 17. Leukocytosis: Resolved 18. Hypotensive: Stoped Lisinopril: BP drops easily- do not add hypertensives.   7/18. DBP elevated at times, esp in AM  19. Dispo  7/12- will attempt to d/c pt before next Tuesday so can f/u with Dr Shaaron Adler- also , will order teaching of bowel program to family.  7/13- will d/w team to try and d/c by next Tuesday    7/14: Discussed with mom- no foley upon discharge. Patient and mom prefer allowing him to avoid and I/O cath as needed.    Patient is a C4 ASIA B-  tetraplegic due to neurofibromatosis- he absolutely needs a power w/c, with joystick- T handle -he cannot do his own pressure relief in manual w/c, and is unable to propel a manual w/c, so he ABSOLUTELY requires a power w/c to get where he needs to go.. And needs tilt and recline due to risk of pressure ulcers-   Also needs a hospital bed to make transfers with hoyer lift possible- in regular bed, would not be able to reposition with family's help- need hospital for repositioning, decrease risk of pressure ulcers and transfers.   LOS: 26 days A FACE TO [place] 06/04/2020, 11:21 AM

## 2020-06-04 NOTE — Plan of Care (Signed)
  Problem: Consults Goal: RH SPINAL CORD INJURY PATIENT EDUCATION Description:  See Patient Education module for education specifics.  Outcome: Progressing Goal: Skin Care Protocol Initiated - if Braden Score 18 or less Description: If consults are not indicated, leave blank or document N/A Outcome: Progressing   Problem: SCI BOWEL ELIMINATION Goal: RH STG MANAGE BOWEL WITH ASSISTANCE Description: STG Manage Bowel with max to total Assistance. Outcome: Progressing Goal: RH STG SCI MANAGE BOWEL WITH MEDICATION WITH ASSISTANCE Description: STG SCI Manage bowel with medication with max to total assistance. Outcome: Progressing Goal: RH STG SCI MANAGE BOWEL PROGRAM W/ASSIST OR AS APPROPRIATE Description: STG SCI Manage bowel program with max to total assist or as appropriate. Outcome: Progressing   Problem: SCI BLADDER ELIMINATION Goal: RH STG MANAGE BLADDER WITH ASSISTANCE Description: STG Manage Bladder With max Assistance Outcome: Progressing Goal: RH STG SCI MANAGE BLADDER PROGRAM W/ASSISTANCE Description: Manage bladder program with max assistance. Outcome: Progressing   Problem: RH SKIN INTEGRITY Goal: RH STG SKIN FREE OF INFECTION/BREAKDOWN Description: Skin to remain free from breakdown while on rehab with mod assistance. Outcome: Progressing Goal: RH STG MAINTAIN SKIN INTEGRITY WITH ASSISTANCE Description: STG Maintain Skin Integrity With mod Assistance. Outcome: Progressing Goal: RH STG ABLE TO PERFORM INCISION/WOUND CARE W/ASSISTANCE Description: STG Able To Perform Incision/Wound Care With mod Assistance. Outcome: Progressing   Problem: RH SAFETY Goal: RH STG ADHERE TO SAFETY PRECAUTIONS W/ASSISTANCE/DEVICE Description: STG Adhere to Safety Precautions With mod Assistance and appropriate assistive Device. Outcome: Progressing   Problem: RH PAIN MANAGEMENT Goal: RH STG PAIN MANAGED AT OR BELOW PT'S PAIN GOAL Description: <4 on a 0-10 pain scale. Outcome:  Progressing   Problem: RH KNOWLEDGE DEFICIT SCI Goal: RH STG INCREASE KNOWLEDGE OF SELF CARE AFTER SCI Description: Patient and caregiver will be able to demonstrate medication management, bowel program, bladder program, safety precautions, skin care, and follow up care with the MD with min assist from CIR staff. Outcome: Progressing

## 2020-06-05 MED ORDER — FAMOTIDINE 20 MG PO TABS: 20.0000 mg | ORAL_TABLET | Freq: Two times a day (BID) | ORAL | 0 refills | Status: DC

## 2020-06-05 MED ORDER — PROPRANOLOL HCL 10 MG PO TABS: 10.0000 mg | ORAL_TABLET | Freq: Two times a day (BID) | ORAL | 1 refills | Status: DC

## 2020-06-05 MED ORDER — LEVETIRACETAM 100 MG/ML PO SOLN: 1500.0000 mg | Freq: Two times a day (BID) | ORAL | 12 refills | Status: DC

## 2020-06-05 MED ORDER — ACETAMINOPHEN 325 MG PO TABS: 650.0000 mg | ORAL_TABLET | Freq: Four times a day (QID) | ORAL | Status: DC | PRN

## 2020-06-05 MED ORDER — DEXAMETHASONE 4 MG PO TABS
4.0000 mg | ORAL_TABLET | Freq: Two times a day (BID) | ORAL | 1 refills | Status: AC
Start: 2020-06-05 — End: 2020-07-05

## 2020-06-05 MED ORDER — LEVETIRACETAM 500 MG PO TABS
1500.0000 mg | ORAL_TABLET | Freq: Two times a day (BID) | ORAL | 11 refills | Status: DC
Start: 2020-06-05 — End: 2020-09-08

## 2020-06-05 MED ORDER — BACLOFEN 10 MG PO TABS: 10.0000 mg | ORAL_TABLET | Freq: Two times a day (BID) | ORAL | 0 refills | Status: DC

## 2020-06-05 MED ORDER — BISACODYL 10 MG RE SUPP: 10.0000 mg | Freq: Every day | RECTAL | 0 refills | Status: DC

## 2020-06-05 MED ORDER — SACCHAROMYCES BOULARDII 250 MG PO CAPS: 250.0000 mg | ORAL_CAPSULE | Freq: Two times a day (BID) | ORAL | 0 refills | Status: DC

## 2020-06-05 MED ORDER — DEXAMETHASONE 1 MG/ML PO CONC: 4.0000 mg | Freq: Two times a day (BID) | ORAL | 0 refills | Status: DC

## 2020-06-05 MED ORDER — DEXAMETHASONE 0.5 MG PO TABS
0.5000 mg | ORAL_TABLET | Freq: Four times a day (QID) | ORAL | 0 refills | Status: DC
Start: 2020-06-05 — End: 2020-06-05

## 2020-06-05 MED FILL — FAMOTIDINE 20 MG TABS: 20 | 30 days supply | Qty: 60 | Fill #0

## 2020-06-05 MED FILL — levETIRAcetam 500 MG TABS: 500 | 30 days supply | Qty: 180 | Fill #0

## 2020-06-05 MED FILL — PROPRANOLOL 10 MG TABLET: 10 | 30 days supply | Qty: 60 | Fill #0

## 2020-06-05 MED FILL — DEXAMETHASONE 4 MG TABLET: 4 | 30 days supply | Qty: 60 | Fill #0

## 2020-06-05 MED FILL — BACLOFEN 10 MG TABS: 10 | 30 days supply | Qty: 60 | Fill #0

## 2020-06-05 NOTE — Progress Notes (Signed)
Inpatient Rehabilitation Care Coordinator  Discharge Note  The overall goal for the admission was met for:   Discharge location: Yes. D/c to parent's home in [place] . Parents to provide 24/7 care.   Length of Stay: Yes. 26 days.   Discharge activity level: Yes. Max A to Total A  Home/community participation: Yes. Limited.   Services provided included: MD, RD, PT, OT, SLP, RN, CM, TR, Pharmacy, Neuropsych and SW  Financial Services: Medicare  Follow-up services arranged: Home Health: [facility] for HHPT/OT/ST/Aide, DME: [facility] for hospital bed, extra large sling, DABSC; [name]-specialty w/c; [employer]-catheters and incontinence supplies and Patient/Family request agency HH: [facility], DME: N/A  Comments (or additional information): contact pt mother [name] [phone]  Patient/Family verbalized understanding of follow-up arrangements: Yes  Individual responsible for coordination of the follow-up plan: Pt to have assistance with coordinating care needs.   Confirmed correct DME delivered: Rana Snare 06/05/2020    Rana Snare

## 2020-06-05 NOTE — Plan of Care (Signed)
°  Problem: RH SAFETY Goal: RH STG ADHERE TO SAFETY PRECAUTIONS W/ASSISTANCE/DEVICE Description: STG Adhere to Safety Precautions With mod Assistance and appropriate assistive Device. Outcome: Progressing   Problem: RH KNOWLEDGE DEFICIT SCI Goal: RH STG INCREASE KNOWLEDGE OF SELF CARE AFTER SCI Description: Patient and caregiver will be able to demonstrate medication management, bowel program, bladder program, safety precautions, skin care, and follow up care with the MD with min assist from CIR staff. Outcome: Progressing

## 2020-06-05 NOTE — Plan of Care (Signed)
  Problem: RH Bed Mobility Goal: LTG Patient will perform bed mobility with assist (PT) Description: LTG: Patient will perform bed mobility with assistance, with/without cues (PT). Outcome: Completed/Met   Problem: RH Bed to Chair Transfers Goal: LTG Patient will perform bed/chair transfers w/assist (PT) Description: LTG: Patient will perform bed to chair transfers with assistance (PT). Outcome: Completed/Met   Problem: RH Wheelchair Mobility Goal: LTG Patient will propel w/c in controlled environment (PT) Description: LTG: Patient will propel wheelchair in controlled environment, # of feet with assist (PT) Outcome: Completed/Met Goal: LTG Patient will propel w/c in home environment (PT) Description: LTG: Patient will propel wheelchair in home environment, # of feet with assistance (PT). Outcome: Completed/Met   Problem: RH Pre-functional/Other (Specify) Goal: RH LTG PT (Specify) 1 Description:  RH LTG PT (Specify) 1 Outcome: Completed/Met

## 2020-06-05 NOTE — Progress Notes (Signed)
Patient discharged with mom and dad, PT helped take patient down. Patient stable

## 2020-06-05 NOTE — Progress Notes (Signed)
Incontinent loose stool at 2045, dig. stim with small loose stool. At 0100, incontinent of urine, bladder scan=308, I & O cath=325cc's. At 0452, incontinent of urine, bladder scan=186cc's. Cameron Hale A

## 2020-06-05 NOTE — Progress Notes (Addendum)
Patient ID: Cameron Hale, male   DOB: Mar 25, 1976, 44 y.o.   [name]: [id number]  SW followed up with pt mother to discuss transportation to home. Reports that they were able to purchase a Lucianne Lei, and will transport him home today. States that she would like medications ordered through [facility]. SW confirms DME delivered this weekend by [employer]. *SW provided catheters until shipment from [employer] arrives. Mother to purchase incontinence supplies until shipment arrives. [name] reports items should arrive tomorrow.   Loralee Pacas, MSW, [place] Office: [phone] Cell: [phone] Fax: [phone]

## 2020-06-07 ENCOUNTER — Telehealth: Payer: Self-pay

## 2020-06-07 ENCOUNTER — Telehealth: Payer: Self-pay | Admitting: Registered Nurse

## 2020-06-07 NOTE — Telephone Encounter (Signed)
Verbal okay given for PT & OT twice a week for 5 weeks to Cameron Hale with [facility].

## 2020-06-07 NOTE — Telephone Encounter (Signed)
Transitional Care call Transitional Questions answered by [name]: Mother  Patient name: Cameron Hale  DOB: [birth year] 1. Are you/is patient experiencing any problems since coming home? No a. Are there any questions regarding any aspect of care? No 2. Are there any questions regarding medications administration/dosing? No a. Are meds being taken as prescribed? Yes b. "Patient should review meds with caller to confirm" Medication List Reviewed 3. Have there been any falls? No 4. Has Home Health been to the house and/or have they contacted you? Yes, [facility] a. If not, have you tried to contact them? NA b. Can we help you contact them? NA 5. Are bowels and bladder emptying properly? Yes a. Are there any unexpected incontinence issues? No b. If applicable, is patient following bowel/bladder programs? Yes 6. Any fevers, problems with breathing, unexpected pain? No 7. Are there any skin problems or new areas of breakdown? No 8. Has the patient/family member arranged specialty MD follow up (ie cardiology/neurology/renal/surgical/etc.)?  HFU appointments are scheduled she reports.  a. Can we help arrange? NA 9. Does the patient need any other services or support that we can help arrange? No 10. Are caregivers following through as expected in assisting the patient? Yes 11. Has the patient quit smoking, drinking alcohol, or using drugs as recommended? (                        )  Appointment date/time 06/14/2020, with Dr Dagoberto Ligas. Arrival time 10:40 for 11:00 appointment. At [facility]

## 2020-06-09 ENCOUNTER — Telehealth: Payer: Self-pay | Admitting: *Deleted

## 2020-06-09 NOTE — Telephone Encounter (Signed)
[facility] OT requested POC of 1wk1, 2wk3.  Approval given.

## 2020-06-12 ENCOUNTER — Telehealth: Payer: Self-pay | Admitting: *Deleted

## 2020-06-12 NOTE — Telephone Encounter (Signed)
[facility] OT called and requests 1wk1, 2wk3.  Approval given.

## 2020-06-12 NOTE — Telephone Encounter (Signed)
Talked ot Cameron Hale- needs skilled nursing

## 2020-06-12 NOTE — Telephone Encounter (Signed)
Approval given for SN and Bethena Roys will remind of the appt on Wednesday 06/14/20 @11 :00 arrive by 10:30.

## 2020-06-12 NOTE — Telephone Encounter (Signed)
Cameron Hale [facility] called to request a SN visit to evaluate areas on backside that appear to be breaking down.  He has an appt with Dr Dagoberto Ligas on 06/14/20. Please advise.

## 2020-06-14 ENCOUNTER — Encounter
Payer: Medicare Other | Attending: Physical Medicine and Rehabilitation | Admitting: Physical Medicine and Rehabilitation

## 2020-06-14 ENCOUNTER — Encounter: Payer: Self-pay | Admitting: Physical Medicine and Rehabilitation

## 2020-06-14 ENCOUNTER — Other Ambulatory Visit: Payer: Self-pay

## 2020-06-14 VITALS — BP 120/85 | HR 73 | Temp 97.7°F

## 2020-06-14 DIAGNOSIS — Q8500 Neurofibromatosis, unspecified: Secondary | ICD-10-CM

## 2020-06-14 DIAGNOSIS — G8250 Quadriplegia, unspecified: Secondary | ICD-10-CM

## 2020-06-14 DIAGNOSIS — C720 Malignant neoplasm of spinal cord: Secondary | ICD-10-CM

## 2020-06-14 DIAGNOSIS — G709 Myoneural disorder, unspecified: Secondary | ICD-10-CM | POA: Diagnosis not present

## 2020-06-14 MED ORDER — BACLOFEN 10 MG PO TABS: 10.0000 mg | ORAL_TABLET | Freq: Two times a day (BID) | ORAL | 5 refills | Status: DC

## 2020-06-14 NOTE — Patient Instructions (Addendum)
Pt is a 44 yr old male with C4 ASIA B quadriplegia due to neurofibromatosis and neurogenic bowel and bladder here for hospital f/u.   1. Ordered skilled nursing  For wound care- H/H.   2 Justification for power w/c- pt is a C4 tetraplegia- ASIA B- has ability to push w/c some- but no way pt could-do pressure relief in manual w/c- also elevating leg rests due to spasms- also needs ROHO pressure relieving cushion due to new Stage II pressure ulcer on backside-  1/2 cm x 1/2 cm- 2 mm in depth. Pt unable ot do own transfers- needs hoyer lift, but with power w/c, able to propel his w/c.     3. Do pressure relief in w/c- every 20-30 minutes in w/c- please do tilt in space not recline.   4. Refill Baclofen 10 mg BID- 5 RFs  5. Keppra, Propanolol and decadron needs to either come from Dr Shaaron Adler or PCP- Dr Alroy Dust  6. Needs to have OT make a moveable splint for R elbow- since losing ROM and not holding in extension  7. F/U in 2 months- double appointment  8. Needs PCP to refer to [facility] for neurofibromatosis- tertiary care center

## 2020-06-14 NOTE — Progress Notes (Signed)
Subjective:    Patient ID: Cameron Hale, male    DOB: [birth year], 44 y.o.   [name]: [id number]  HPI  Pt is a 44 yr old male with C4 ASIA B quadriplegia due to neurofibromatosis and neurogenic bowel and bladder here for hospital f/u.   Saw Dr Cameron Hale- trying to get him on new medication.  Very powerful, have to take whole and handle with rubber gloves- strong chemo- can make him nauseated, diarrhea, affect liver/yellow skin.   Takes 80 mg daily x 1 week then 180 mg daily- didn't say for how long.   About the same  Therapy is helping.  Esp OT- moving R hand a little bit- could use the joystick on power w/c and moved it some.  PT- working on arms as well-    Using hoyer lift to transfer.   Trying to do bowel program- because was going 2-3x/day- and started back doing bowel program in last few days.   Little sores on bottom.  1 is a little open- tip of fingernail- 1 is red- like a little bloody spot- others are closed.  Needs to get them healed because doesn't think can take Chemo with open wound.   EMUAID- ordered it- to get Saturday. To heal wounds on bottom.   Hoping that he gets back somewhat like he was.  Chemo to prevent him from getting worse.  Cameron Hale walked into the hospital.   Was walking with RW prior to admission.  Wasn't sure what "got him like this".   Being transferred by hoyer lift-  Have hospital bed Loan[facility] w/c  Has BSC, but hasn't used yet     Pain Inventory Average Pain 0 Pain Right Now 0 My pain is no pain  In the last 24 hours, has pain interfered with the following? General activity no pain Relation with others no pain Enjoyment of life no pain What TIME of day is your pain at its worst? no pain Sleep (in general) Good  Pain is worse with: no pain Pain improves with: no pain Relief from Meds: no pain  Mobility use a wheelchair needs help with transfers  Function disabled: date disabled .  Neuro/Psych bladder control problems bowel  control problems  Prior Studies transitional  Physicians involved in your care transitional   History reviewed. No pertinent family history. Social History   Socioeconomic History  . Marital status: Married    Spouse name: Not on file  . Number of children: Not on file  . Years of education: Not on file  . Highest education level: Not on file  Occupational History  . Not on file  Tobacco Use  . Smoking status: Never Smoker  . Smokeless tobacco: Never Used  Vaping Use  . Vaping Use: Never used  Substance and Sexual Activity  . Alcohol use: Not Currently    Alcohol/week: 0.0 standard drinks  . Drug use: Never  . Sexual activity: Not Currently  Other Topics Concern  . Not on file  Social History Narrative  . Not on file   Social Determinants of Health   Financial Resource Strain:   . Difficulty of Paying Living Expenses:   Food Insecurity:   . Worried About Charity fundraiser in the Last Year:   . Arboriculturist in the Last Year:   Transportation Needs:   . Film/video editor (Medical):   Marland Kitchen Lack of Transportation (Non-Medical):   Physical Activity:   . Days of Exercise per  Week:   . Minutes of Exercise per Session:   Stress:   . Feeling of Stress :   Social Connections:   . Frequency of Communication with Friends and Family:   . Frequency of Social Gatherings with Friends and Family:   . Attends Religious Services:   . Active Member of Clubs or Organizations:   . Attends Archivist Meetings:   Marland Kitchen Marital Status:    Past Surgical History:  Procedure Laterality Date  . brain bleed     mri 2009  . HAND EXPLORATION    . LUMBAR FUSION    . NEPHRECTOMY     Partial left nephrectomy   . SPINAL FUSION     Past Medical History:  Diagnosis Date  . NF2 (neurofibromatosis 2) (HCC)    BP 120/85   Pulse 73   Temp 97.7 F (36.5 C)   SpO2 98%   Opioid Risk Score:   Fall Risk Score:  `1  Depression screen PHQ 2/9  No flowsheet data  found.  Review of Systems  Constitutional: Negative.   HENT: Negative.   Eyes: Negative.   Respiratory: Negative.   Cardiovascular: Negative.   Gastrointestinal: Negative.        Bowel control problems   Endocrine: Negative.   Genitourinary: Negative.   Musculoskeletal: Negative.   Allergic/Immunologic: Negative.   Neurological: Negative.   Hematological: Negative.   Psychiatric/Behavioral: Negative.   All other systems reviewed and are negative.      Objective:   Physical Exam   Awake, alert, appropriate, vague, no spontaneous speech, mother in appointment; manual w/c, NAD Hairs growing back in on head Neuro No clonus; equivocal Hoffmans B/L Losing ROM and extension of R elbow- can get to -10 degrees, but not full extension- holds at >90 degrees at rest MAS of 2 in R elbow- otherwise MAS of 1 in UEs- MAS of 0 in LEs B/L MS: Biceps 2-/5, triceps 1/5, WE 1/5, grip 2-/5, finger abd 1/5 in R- slightly les sin LUE 0/5 in LEs Feels  A little cool to touch- temp normal -      Assessment & Plan:   Pt is a 44 yr old male with C4 ASIA B quadriplegia due to neurofibromatosis and neurogenic bowel and bladder here for hospital f/u.   1. Ordered skilled nursing  For wound care- H/H.   2 Justification for power w/c- pt is a C4 tetraplegia- ASIA B- has ability to push w/c some- but no way pt could-do pressure relief in manual w/c- also elevating leg rests due to spasms- also needs ROHO pressure relieving cushion due to new Stage II pressure ulcer on backside-  1/2 cm x 1/2 cm- 2 mm in depth. Pt unable ot do own transfers- needs hoyer lift, but with power w/c, able to propel his w/c.     3. Do pressure relief in w/c- every 20-30 minutes in w/c- please do tilt in space not recline.   4. Refill Baclofen 10 mg BID- 5 RFs  5. Keppra, Propanolol and decadron needs to either come from Dr Cameron Hale or PCP- Dr Cameron Hale  6. Needs to have OT make a moveable splint for R elbow- since losing  ROM and not holding in extension  7. F/U in 2 months- double appointment  8. Needs PCP to refer to [facility] for neurofibromatosis- tertiary care center  I spent  A total of 30 minutes on appointment- as detailed above.

## 2020-06-19 ENCOUNTER — Telehealth: Payer: Self-pay

## 2020-06-19 NOTE — Telephone Encounter (Signed)
Marita Kansas, RN/[facility] Duoderm dressing twice a week for wound on sacrum. HHRN 3wk1, 2wk3. Orders approved and given

## 2020-06-28 ENCOUNTER — Other Ambulatory Visit: Payer: Self-pay

## 2020-06-28 ENCOUNTER — Ambulatory Visit (INDEPENDENT_AMBULATORY_CARE_PROVIDER_SITE_OTHER): Payer: Medicare Other | Admitting: Podiatry

## 2020-06-28 DIAGNOSIS — B351 Tinea unguium: Secondary | ICD-10-CM | POA: Diagnosis not present

## 2020-06-28 DIAGNOSIS — Q8502 Neurofibromatosis, type 2: Secondary | ICD-10-CM | POA: Diagnosis not present

## 2020-06-28 DIAGNOSIS — L853 Xerosis cutis: Secondary | ICD-10-CM | POA: Diagnosis not present

## 2020-06-28 DIAGNOSIS — M79674 Pain in right toe(s): Secondary | ICD-10-CM | POA: Diagnosis not present

## 2020-06-28 DIAGNOSIS — L97521 Non-pressure chronic ulcer of other part of left foot limited to breakdown of skin: Secondary | ICD-10-CM | POA: Diagnosis not present

## 2020-06-28 DIAGNOSIS — M79675 Pain in left toe(s): Secondary | ICD-10-CM

## 2020-06-28 MED ORDER — AMMONIUM LACTATE 12 % EX LOTN: 1.0000 "application " | TOPICAL_LOTION | CUTANEOUS | 0 refills | Status: AC | PRN

## 2020-06-29 ENCOUNTER — Telehealth: Payer: Self-pay

## 2020-06-29 ENCOUNTER — Encounter: Payer: Self-pay | Admitting: Podiatry

## 2020-06-29 NOTE — Progress Notes (Signed)
  Subjective:  Patient ID: Cameron Hale, male    DOB: June 07, 1976,  [name]: [id number]  No chief complaint on file.  44 y.o. male returns for the above complaint.  Patient presents with thickened elongated dystrophic toenails x10.  Patient would like to have them debrided down as she is not able to do it herself.  She also has complaint of bilateral severe xerosis to the lower extremity.  She would like to discuss treatment options for it.  She has tried over-the-counter lotion which has not helped.  She has not tried anything else for it.  She is known to Dr. Earleen Newport who treated her for the left wound which has healed completely without any underlying further wound noted.  Objective:  There were no vitals filed for this visit. Podiatric Exam: Vascular: dorsalis pedis and posterior tibial pulses are palpable bilateral. Capillary return is immediate. Temperature gradient is WNL. Skin turgor WNL  Sensorium: Normal Semmes Weinstein monofilament test. Normal tactile sensation bilaterally. Nail Exam: Pt has thick disfigured discolored nails with subungual debris noted bilateral entire nail hallux through fifth toenails.  Pain on palpation to the nails. Ulcer Exam: There is no evidence of ulcer or pre-ulcerative changes or infection. Orthopedic Exam: Muscle tone and strength are WNL. No limitations in general ROM. No crepitus or effusions noted. HAV  B/L.  Hammer toes 2-5  B/L. Skin: No Porokeratosis. No infection or ulcers.  Dry skin moderate to severe noted to bilateral lower extremity    Assessment & Plan:   1. Skin ulcer of left foot, limited to breakdown of skin ([place])   2. Type 2 neurofibromatosis ([place])   3. Xerosis cutis   4. Pain due to onychomycosis of toenails of both feet     Patient was evaluated and treated and all questions answered.  Xerosis bilateral lower extremity -I explained to the patient the etiology of xerosis and various treatment options were extensively discussed.  I  explained to the patient the importance of maintaining moisturization of the skin with application of over-the-counter lotion such as Eucerin or Luciderm.  Given that patient has failed over-the-counter lotion I believe patient will benefit from prescription lotion.  Ammonium lactate was sent to the pharmacy.  I was asked the patient to apply twice a day.  Patient states understanding   Onychomycosis with pain  -Nails palliatively debrided as below. -Educated on self-care  Procedure: Nail Debridement Rationale: pain  Type of Debridement: manual, sharp debridement. Instrumentation: Nail nipper, rotary burr. Number of Nails: 10  Procedures and Treatment: Consent by patient was obtained for treatment procedures. The patient understood the discussion of treatment and procedures well. All questions were answered thoroughly reviewed. Debridement of mycotic and hypertrophic toenails, 1 through 5 bilateral and clearing of subungual debris. No ulceration, no infection noted.  Return Visit-Office Procedure: Patient instructed to return to the office for a follow up visit 3 months for continued evaluation and treatment.  Boneta Lucks, DPM    No follow-ups on file.

## 2020-06-29 NOTE — Telephone Encounter (Signed)
Pam with [facility]: Seeking a verbal order for OT. Twice a week for 3 weeks for power-wheelchair use.   Verbal given.

## 2020-06-30 ENCOUNTER — Ambulatory Visit: Payer: Medicare Other | Admitting: Podiatry

## 2020-07-03 ENCOUNTER — Telehealth: Payer: Self-pay

## 2020-07-03 ENCOUNTER — Telehealth: Payer: Self-pay | Admitting: Nurse Practitioner

## 2020-07-03 NOTE — Telephone Encounter (Signed)
Bethena Roys SLP called and wants therapy extended  (slp) to 2 x week for 3 week - ptn having difficulties her number is [phone]

## 2020-07-03 NOTE — Telephone Encounter (Signed)
Called patient's cell to schedule Palliative Consult and patient's mother, [name], answered the phone. Mother stated that patient is staying with her currently.  Discussed Palliative services with her and all questions were answered and she was in agreement with this.  I have scheduled an In-person Consult for 07/20/20 @ 10 AM.

## 2020-07-03 NOTE — Telephone Encounter (Signed)
I called and gave approval- thank you, ML

## 2020-07-04 ENCOUNTER — Encounter (HOSPITAL_BASED_OUTPATIENT_CLINIC_OR_DEPARTMENT_OTHER): Payer: Medicare Other | Attending: Internal Medicine | Admitting: Internal Medicine

## 2020-07-04 DIAGNOSIS — Q8502 Neurofibromatosis, type 2: Secondary | ICD-10-CM | POA: Insufficient documentation

## 2020-07-04 DIAGNOSIS — L89153 Pressure ulcer of sacral region, stage 3: Secondary | ICD-10-CM | POA: Insufficient documentation

## 2020-07-04 NOTE — Progress Notes (Signed)
Cameron Hale, Cameron Hale ([id number]) Visit Report for 07/04/2020 Abuse/Suicide Risk Screen Details Patient Name: Date of Service: [name] ID B. 07/04/2020 7:30 A M Medical Record Number: [id number] Patient Account Number: [id number] Date of Birth/Sex: Treating RN: 1975/12/22 (44 y.o. Male) Baruch Gouty Primary Care Jakiah Bienaime: Alroy Dust, L.DEA N Other Clinician: Referring Veasna Santibanez: Treating Shauntavia Brackin/Extender: Arbutus Ped, L.DEA Asencion Partridge in Treatment: 0 Abuse/Suicide Risk Screen Items Answer ABUSE RISK SCREEN: Has anyone close to you tried to hurt or harm you recentlyo No Do you feel uncomfortable with anyone in your familyo No Has anyone forced you do things that you didnt want to doo No Electronic Signature(s) Signed: 07/04/2020 5:25:51 PM By: Baruch Gouty RN, BSN Entered By: Baruch Gouty on 07/04/2020 08:16:39 -------------------------------------------------------------------------------- Activities of Daily Living Details Patient Name: Date of Service: [name] ID B. 07/04/2020 7:30 A M Medical Record Number: [id number] Patient Account Number: [id number] Date of Birth/Sex: Treating RN: [birth year] (44 y.o. Male) Baruch Gouty Primary Care Gareld Obrecht: Alroy Dust, L.DEA N Other Clinician: Referring Cairo Agostinelli: Treating Tolbert Matheson/Extender: Arbutus Ped, L.DEA Asencion Partridge in Treatment: 0 Activities of Daily Living Items Answer Activities of Daily Living (Please select one for each item) Drive Automobile Not Able T Medications ake Need Assistance Use T elephone Need Assistance Care for Appearance Need Assistance Use T oilet Not Able Bath / Shower Need Assistance Dress Self Need Assistance Feed Self Need Assistance Walk Not Able Get In / Out Bed Not Able Housework Not Able Prepare Meals Not Able Handle Money Need Assistance Shop for Self Not Able Electronic Signature(s) Signed: 07/04/2020 5:25:51 PM By: Baruch Gouty RN, BSN Entered By: Baruch Gouty on 07/04/2020 08:17:24 -------------------------------------------------------------------------------- Education Screening Details Patient Name: Date of Service: Cameron Hale, Cameron V ID B. 07/04/2020 7:30 A M Medical Record Number: [id number] Patient Account Number: [id number] Date of Birth/Sex: Treating RN: 07-21-1976 (44 y.o. Male) Baruch Gouty Primary Care Othel Hoogendoorn: Alroy Dust, L.DEA N Other Clinician: Referring Ovadia Lopp: Treating Colum Colt/Extender: Arbutus Ped, L.DEA Asencion Partridge in Treatment: 0 Primary Learner Assessed: Caregiver mother Learning Preferences/Education Level/Primary Language Learning Preference: Explanation, Demonstration, Printed Material Highest Education Level: College or Above Preferred Language: English Cognitive Barrier Language Barrier: No Translator Needed: No Memory Deficit: No Emotional Barrier: No Cultural/Religious Beliefs Affecting Medical Care: No Physical Barrier Impaired Vision: Yes Glasses Impaired Hearing: No Decreased Hand dexterity: Yes Limitations: quadraplegia Knowledge/Comprehension Knowledge Level: High Comprehension Level: High Ability to understand written instructions: High Ability to understand verbal instructions: High Motivation Anxiety Level: Calm Cooperation: Cooperative Education Importance: Acknowledges Need Interest in Health Problems: Asks Questions Perception: Coherent Willingness to Engage in Self-Management High Activities: Readiness to Engage in Self-Management High Activities: Electronic Signature(s) Signed: 07/04/2020 5:25:51 PM By: Baruch Gouty RN, BSN Entered By: Baruch Gouty on 07/04/2020 62:95:28 -------------------------------------------------------------------------------- Fall Risk Assessment Details Patient Name: Date of Service: Cameron Hale, Cameron V ID B. 07/04/2020 7:30 A M Medical Record Number: [id number] Patient Account Number: [id number] Date of Birth/Sex: Treating RN: November 03, 1976  (44 y.o. Male) Baruch Gouty Primary Care Arnice Vanepps: Alroy Dust, L.DEA N Other Clinician: Referring Lisle Skillman: Treating Twylah Bennetts/Extender: Arbutus Ped, L.DEA Asencion Partridge in Treatment: 0 Fall Risk Assessment Items Have you had 2 or more falls in the last 12 monthso 0 No Have you had any fall that resulted in injury in the last 12 monthso 0 No FALLS RISK SCREEN History of falling - immediate or within 3 months 0 No Secondary diagnosis (Do you have 2 or more medical diagnoseso) 15 Yes Ambulatory aid None/bed rest/wheelchair/nurse  0 Yes Crutches/cane/walker 0 No Furniture 0 No Intravenous therapy Access/Saline/Heparin Lock 0 No Gait/Transferring Normal/ bed rest/ wheelchair 0 Yes Weak (short steps with or without shuffle, stooped but able to lift head while walking, may seek 0 No support from furniture) Impaired (short steps with shuffle, may have difficulty arising from chair, head down, impaired 0 No balance) Mental Status Oriented to own ability 0 Yes Electronic Signature(s) Signed: 07/04/2020 5:25:51 PM By: Baruch Gouty RN, BSN Entered By: Baruch Gouty on 07/04/2020 08:18:54 -------------------------------------------------------------------------------- Foot Assessment Details Patient Name: Date of Service: Cameron Hale, Cameron V ID B. 07/04/2020 7:30 A M Medical Record Number: [id number] Patient Account Number: [id number] Date of Birth/Sex: Treating RN: [birth year] (44 y.o. Male) Baruch Gouty Primary Care Vessie Olmsted: Alroy Dust, L.DEA N Other Clinician: Referring Shavelle Runkel: Treating Misael Mcgaha/Extender: Arbutus Ped, L.DEA N Weeks in Treatment: 0 Foot Assessment Items Site Locations + = Sensation present, - = Sensation absent, C = Callus, U = Ulcer R = Redness, W = Warmth, M = Maceration, PU = Pre-ulcerative lesion F = Fissure, S = Swelling, D = Dryness Assessment Right: Left: Other Deformity: No No Prior Foot Ulcer: No No Prior Amputation: No No Charcot  Joint: No No Ambulatory Status: Non-ambulatory Assistance Device: Wheelchair Gait: Electronic Signature(s) Signed: 07/04/2020 5:25:51 PM By: Baruch Gouty RN, BSN Entered By: Baruch Gouty on 07/04/2020 08:20:11 -------------------------------------------------------------------------------- Nutrition Risk Screening Details Patient Name: Date of Service: Cameron Hale, Cameron V ID B. 07/04/2020 7:30 A M Medical Record Number: [id number] Patient Account Number: [id number] Date of Birth/Sex: Treating RN: Apr 21, 1976 (44 y.o. Male) Baruch Gouty Primary Care Kennen Stammer: Alroy Dust, L.DEA N Other Clinician: Referring Leelynn Whetsel: Treating Hadrian Yarbrough/Extender: Arbutus Ped, L.DEA N Weeks in Treatment: 0 Height (in): 70 Weight (lbs): 140 Body Mass Index (BMI): 20.1 Nutrition Risk Screening Items Score Screening NUTRITION RISK SCREEN: I have an illness or condition that made me change the kind and/or amount of food I eat 2 Yes I eat fewer than two meals per day 0 No I eat few fruits and vegetables, or milk products 0 No I have three or more drinks of beer, liquor or wine almost every day 0 No I have tooth or mouth problems that make it hard for me to eat 2 Yes I don't always have enough money to buy the food I need 0 No I eat alone most of the time 0 No I take three or more different prescribed or over-the-counter drugs a day 1 Yes Without wanting to, I have lost or gained 10 pounds in the last six months 2 Yes I am not always physically able to shop, cook and/or feed myself 2 Yes Nutrition Protocols Good Risk Protocol Moderate Risk Protocol High Risk Proctocol 0 Provide education on nutrition Risk Level: High Risk Score: 9 Notes using ensure 3 times per day Electronic Signature(s) Signed: 07/04/2020 5:25:51 PM By: Baruch Gouty RN, BSN Entered By: Baruch Gouty on 07/04/2020 08:19:52

## 2020-07-15 NOTE — Progress Notes (Signed)
YERIEL, MINEO ([id number]) Visit Report for 07/04/2020 Chief Complaint Document Details Patient Name: Date of Service: Cameron Hale ID B. 07/04/2020 7:30 A M Medical Record Number: [id number] Patient Account Number: [id number] Date of Birth/Sex: Treating RN: 24-Dec-1975 (44 y.o. Male) Cameron Hale Primary Care Provider: Alroy Dust, L.DEA N Other Clinician: Referring Provider: Treating Provider/Extender: Arbutus Ped, L.DEA Asencion Partridge in Treatment: 0 Information Obtained from: Patient Chief Complaint 10/27/2018; patient is here for review of a chronic wound on his left lateral foot 07/04/2020; patient is here for review of a pressure ulcer on the lower sacrum Electronic Signature(s) Signed: 07/04/2020 6:57:29 PM By: Linton Ham MD Entered By: Linton Ham on 07/04/2020 09:13:16 -------------------------------------------------------------------------------- Debridement Details Patient Name: Date of Service: Cameron Hale, [name] ID B. 07/04/2020 7:30 A M Medical Record Number: [id number] Patient Account Number: [id number] Date of Birth/Sex: Treating RN: 23-Apr-1976 (43 y.o. Male) Cameron Hale Primary Care Provider: Alroy Dust, L.DEA N Other Clinician: Referring Provider: Treating Provider/Extender: Arbutus Ped, L.DEA Asencion Partridge in Treatment: 0 Debridement Performed for Assessment: Wound #3 Sacrum Performed By: Physician Ricard Dillon., MD Debridement Type: Debridement Level of Consciousness (Pre-procedure): Awake and Alert Pre-procedure Verification/Time Out Yes - 08:56 Taken: Start Time: 08:56 T Area Debrided (L x W): otal 2.6 (cm) x 1.4 (cm) = 3.64 (cm) Tissue and other material debrided: Viable, Non-Viable, Slough, Subcutaneous, Slough Level: Skin/Subcutaneous Tissue Debridement Description: Excisional Instrument: Curette Bleeding: Minimum Hemostasis Achieved: Pressure End Time: 08:57 Procedural Pain: 0 Post Procedural Pain: 0 Response to Treatment:  Procedure was tolerated well Level of Consciousness (Post- Awake and Alert procedure): Post Debridement Measurements of Total Wound Length: (cm) 2.6 Stage: Category/Stage III Width: (cm) 1.4 Depth: (cm) 0.1 Volume: (cm) 0.286 Character of Wound/Ulcer Post Debridement: Improved Post Procedure Diagnosis Same as Pre-procedure Electronic Signature(s) Signed: 07/04/2020 6:57:29 PM By: Linton Ham MD Signed: 07/14/2020 5:50:16 PM By: Cameron Coria RN Entered By: Linton Ham on 07/04/2020 09:12:29 -------------------------------------------------------------------------------- HPI Details Patient Name: Date of Service: Cameron Hale, [name] ID B. 07/04/2020 7:30 A M Medical Record Number: [id number] Patient Account Number: [id number] Date of Birth/Sex: Treating RN: 13-Jul-1976 (44 y.o. Male) Cameron Hale Primary Care Provider: Alroy Dust, L.DEA N Other Clinician: Referring Provider: Treating Provider/Extender: Arbutus Ped, L.DEA Asencion Partridge in Treatment: 0 History of Present Illness HPI Description: ADMISSION 10/27/2018 This is a very pleasant 44 year old man who is not a diabetic or smoker. 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 He has an area on his plantar left foot that is been there for over a year. He is followed for most of 2019 by Dr. Jacqualyn Posey at [facility]. An MRI done on 12/25/2017 showed no osteomyelitis in this area and in fact by the end of March/19 this had healed over. He tells me he wears AFO braces and he went back to his old brace after this healed in March and the wound reopened by sometime in the summer. He was treated with a course of Keflex in July and more recently has been put on doxycycline. He has been applying Silvadene to this wound since August. Because of his gait  ataxia he wears modified shoes with AFO braces. I think these are designed to try and keep him off the lateral part of both feet. He is already been to the people who made his braces for him and there  was some adaption placed on the left shoe Past medical history essentially as listed above. He has recurrent meningiomas and acoustic neuroma for which she has had intracranial surgery. He has peripheral neuropathy apparently secondary to neurofibromatosis type II although I do not have any information on this currently. The patient is on Decadron I think related to intracranial edema. He was recently started on doxycycline by Dr. Jacqualyn Posey I believe on 12/ 6 ABI in our clinic was 0.96 on the right he has easily palpable pulses. 11/03/2018; wound area is improved although he still has some undermining from roughly 7:00 to 2:00 at roughly 0.3 cm. There is no evidence of infection. He comes in today with a Cam walking boot which he is using instead of the shoe with the brace. As noted in my note last week there is absolutely no possibility to offload this man with a total contact cast. We are using silver collagen 11/09/2018; not a lot of improvement. Undermining is from 11-3 o'clock at perhaps 0.5 cm. There is no evidence of infection. Depth is about the same. We have been using silver collagen 11/17/2018; still a worsening situation today. He presented with denuded skin from the wound on the lateral part of his foot more superiorly. This needed to be removed the wound is expanded. The cause of this is not a 100% clear. I cultured the wound bed but this may all be pressure. He is using a cam boot. We have been using silver collagen. I will change him to silver alginate today 11/24/2018; much better this week. CULTURE from last time grew group B strep. I put him on amoxicillin 500 3 times daily. XRAY suggested cannot exclude early osteomyelitis involving the base of the left fifth metatarsal. I have looked at  this myself. This is very subtle. I gave him a healing sandal instead of the cam walker last time he went home and probably fell. He was not heard. He put on his own shoe with an AFO brace and he has had no problems since then. He has felt in the base of this to offload the area 1/17; continuing much improvement in the condition of the wound at the base of the left fifth metatarsal. Only one small open area remains here. I removed some debris from the wound surface and thick skin and subcutaneous tissue from the circumference. I am hopeful this is on the way to closure. 1/24; considerable decline since last week. The patient says he had a fall on the way to the bathroom and he is uncertain whether this is contributed to it. The area on the left lateral foot at roughly the base of the fifth metatarsal is larger and undermining superiorly. There is a new wound on the left lateral but dorsal foot. The 2 of these do not combine but the whole presentation is somewhat worrisome. I have ordered an MRI of the foot to compare with the one he had done in February that was negative for osteomyelitis in this area. He is on amoxicillin that I gave him for a culture that showed group B strep however he shows me a rash across his upper back and neck that is a faint but somewhat morbilliform rash probably a drug rash. I have told him to come off the amoxicillin 1/31; the patient arrived in clinic last week with a considerable decline in the left foot in fact there was an area more dorsal on the foot that appeared almost to have a connection.  I had ordered an MRI and it was supposed to be done today however earlier this week the clinic received a call from the patient's sister reporting marked pain. The MRI was moved up and it was done on 1/27. This did not show a fluid collection or hematoma. There was a soft tissue wound noted over the lateral aspect of the base of the fifth metatarsal without osteomyelitis 2/11;  left lateral foot looks better today but still a punched out area on the lateral aspect. We have been using silver alginate. There does not appear to be a good way to offload this area which is his weightbearing surface 2/18; left lateral foot. Less surrounding callus today. He says he has been offloading this walking very sparingly. We used silver collagen started last week. 2/24 left lateral foot. Surface of the wound looks better. We have been using silver collagen 3/2 left lateral foot. Surface of the wound continues to improve. I think he is more religious and staying off his foot. He is still not gone to [facility] to see if they can do anything to his shoe which according to the patient and his father is already been adopted. 3/10; left lateral foot. Unfortunately things do not look as well. More depth today at 0.4 cm. Necrotic debris on the base of the wound and still thick callused skin around the wound in spite of the fact that the patient insists he is staying off this religiously. We have not been able to offload this anymore. I did try using a surgical shoe at one point he promptly went home and fell. He is frustrated enough today that he is asking me about a total contact cast again. We have been using silver collagen to the wound Patient returns for the 3 wounds the left first toe and the right first and second toe dorsal surfaces. The left first toe is closed up and healed, the right first toe dorsal surface wound is extensive and appears the same as last time, no purulent drainage or odor reported per nurse, left second toe wound is slightly smaller. He has completed the dicloxacillin as of today. Noted that his culture previously had grown MSSA. We have been using calcium alginate to the wounds. X-rays have been reviewed. ABIs were reviewed as well. 3/17-Patient returns to clinic for his left plantar wound, nursing reports that there has been fair amount of drainage, some maceration  around the ulcer area, patient's cultures have come back with Proteus sensitive to most antibiotics. We are using calcium alginate to the wounds and may have to switch to drawtex to address the extra drainage, he will undergo gamma knife for his new brain lesions this coming week. 3/26; left lateral plantar foot wound. Culture grew Proteus and he was given Cipro. We have been using silver alginate with secondary drawtex. He is making all efforts to offload this part of his foot. 4/2; left lateral plantar foot. Not overtly infected. Still requiring debridement. Using silver alginate but I changed to endoform today 4/16 left plantar foot. Not overtly infected. No major change in this. It has 0.7 cm in depth we have been using endoform since he was last here 2 weeks ago. Reviewed his previous MRI that did not show osteomyelitis at the end of January/20. 4/30; left plantar foot. Not overtly infected but 1 cm in depth. We have been using endoform for about a month now without much improvement. Previous MRI at the end of January did not show osteomyelitis.  Each time he comes in he has thick callus around the wound I think this is a pressure issue although with his modified shoe and brace it is hard to get any legitimate offloading of this area 5/7 left lateral plantar foot. Punched out wound with 1 cm of undermining anteriorly. We have been using endoform. He comes in today discussing paying privately for the [employer] and wanting to try a total contact cast. The patient's gait is very abnormal, I have watched this in the past the certainly walks on the outside of his foot. He has a cam boot at home he brought this in today. 5/21-Patient returns for left lateral plantar foot ulcer, this area is thickly callused, there is a small hematoma at the punched-out hole. We have been using cam boot and endoform 5/26; left lateral plantar foot ulcer. Much better. Only a small amount of ulcer actually remains. He has  thick callus around the area. We have been using endoform 6/9; left lateral plantar foot this is totally closed this week. He has been using felt offloading and gauze around the wound and using a cam boot exclusively. I had thought at one point that nothing short of a total contact cast would heal this although I think he has been more religious and staying off his foot as well. He has an appointment with [facility] in [facility] to review the situation of his offloading. There is no doubt that this is a weightbearing surface for him READMISSION 07/04/2020 Cameron Hale is now a 44 year old man. We have him in this clinic for a prolonged period from December 2019 through June 2019 for treatment of a ulcer on his left foot. He is not a diabetic however he has type II neurofibromatosis has had multiple surgeries and disability secondary to this. Nevertheless we are able to get this to heal out and he was apparently doing quite well. 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 He was admitted electively to have this removed at [facility] in April. 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 He spent a protracted period of time in rehab at [facility] and then rehab at [facility] but he is now back home being cared for by his parents. Unfortunately he does not have much arm function, cannot feed himself. 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 I have not looked over the hospitalizations or surgery at [facility] and any major detail however this summarizes what my understanding of things is currently. He apparently eats well but needs to be fed. His mother is changing the dressings although he has advanced home care. They are using Medihoney alginate. He does not have a Foley catheter he is doubly incontinent. He does not have a pressure relief mattress. He spends a few  hours in the morning for breakfast and bathing but the rest of the time is in bed being turned every 2 by his parents Other than the above nothing seems to be particularly different in his history/medical history. There apparently trying to get approval for an oral chemotherapy drug for him. However his duration of quadriplegia has been extensive enough even if this were to be effective I doubt it would result in any major improvement although I did not talk to him about this in any great detail Electronic Signature(s) Signed: 07/04/2020 6:57:29 PM By: Linton Ham MD Entered By: Dellia Nims,  Legrand Como on 07/04/2020 09:16:53 -------------------------------------------------------------------------------- Physical Exam Details Patient Name: Date of Service: Cameron Hale ID B. 07/04/2020 7:30 A M Medical Record Number: [id number] Patient Account Number: [id number] Date of Birth/Sex: Treating RN: 11-07-1976 (44 y.o. Male) Cameron Hale Primary Care Provider: Alroy Dust, L.DEA N Other Clinician: Referring Provider: Treating Provider/Extender: Arbutus Ped, L.DEA Asencion Partridge in Treatment: 0 Constitutional Patient is hypertensive.. Pulse regular and within target range for patient.Marland Kitchen Respirations regular, non-labored and within target range.. Temperature is normal and within the target range for the patient.Marland Kitchen Appears in no distress. Respiratory work of breathing is normal. Gastrointestinal (GI) Abdomen is soft and non-distended without masses or tenderness.. No liver or spleen enlargement. Genitourinary (GU) . Integumentary (Hair, Skin) . Psychiatric Patient is awake and responsive. Difficult to gauge his mood wound exam; the patient has a wound on the lower sacrum. I think this is probably a stage III. Very adherent white fibrinous debris over the surface wound exam with okay. Notes wound exam; lower sacrum like stage 3. adherent fibrous debris over the surface. debridement with a #3  curette. no surrounding infection Electronic Signature(s) Signed: 07/04/2020 6:57:29 PM By: Linton Ham MD Entered By: Linton Ham on 07/04/2020 09:19:54 -------------------------------------------------------------------------------- Physician Orders Details Patient Name: Date of Service: Cameron Hale, [name] ID B. 07/04/2020 7:30 A M Medical Record Number: [id number] Patient Account Number: [id number] Date of Birth/Sex: Treating RN: 1976/03/03 (44 y.o. Male) Cameron Hale Primary Care Provider: Alroy Dust, L.DEA N Other Clinician: Referring Provider: Treating Provider/Extender: Arbutus Ped, L.DEA Asencion Partridge in Treatment: 0 Verbal / Phone Orders: No Diagnosis Coding Follow-up Appointments ppointment in 2 weeks. - ****[facility] - ROOM 5**** Return A Dressing Change Frequency Wound #3 Sacrum Change dressing every day. Skin Barriers/Peri-Wound Care Wound #3 Sacrum Skin Prep Wound Cleansing Wound #3 Sacrum Clean wound with Normal Saline. - or wound cleanser Primary Wound Dressing Wound #3 Sacrum Santyl Ointment - may use Medihoney if Santyl not available Secondary Dressing Wound #3 Sacrum Foam Border - or ABD pad and tape Off-Loading Low air-loss mattress (Group 2) Roho cushion for wheelchair Turn and reposition every 2 hours [facility] skilled nursing for wound care. - Advanced Patient Medications llergies: morphine, amoxicillin A Notifications Medication Indication Start End Santyl DOSE 30 - topical 250 unit/gram ointment - 30 gm ointment topical daily to wound Electronic Signature(s) Signed: 07/04/2020 6:57:29 PM By: Linton Ham MD Signed: 07/06/2020 5:36:57 PM By: Cameron Hurst RN, BSN Signed: 07/06/2020 5:36:57 PM By: Cameron Hurst RN, BSN Entered By: Cameron Hale on 07/04/2020 09:04:23 Prescription 07/04/2020 -------------------------------------------------------------------------------- Cameron Hale, Cameron B. Linton Ham  MD Patient Name: Provider: 1976-06-11 [id number] Date of Birth: NPI#: [id number] Sex: DEA #: [id number] Phone #: License #: [place] Patient Address: [address] [place], [place] [address] [address] [place], [place] [address] [phone] Allergies morphine; amoxicillin Medication Medication: Route: Strength: Form: Santyl topical 250 unit/gram ointment Class: TOPICAL/MUCOUS MEMBR./SUBCUT ENZYMES . Dose: Frequency / Time: Indication: 30 30 gm ointment topical daily to wound Number of Refills: Number of Units: 0 Generic Substitution: Start Date: End Date: Administered at Facility: Substitution Permitted No Note to Pharmacy: Hand Signature: Date(s): Electronic Signature(s) Signed: 07/04/2020 6:57:29 PM By: Linton Ham MD Signed: 07/06/2020 5:36:57 PM By: Cameron Hurst RN, BSN Entered By: Cameron Hale on 07/04/2020 09:04:24 -------------------------------------------------------------------------------- Problem List Details Patient Name: Date of Service: Cameron Hale, [name] ID B. 07/04/2020 7:30 A M Medical Record Number: [id number]  Patient Account Number: [id number] Date of Birth/Sex: Treating RN: 1976/02/10 (44 y.o. Male) Cameron Hale Primary Care Provider: Alroy Dust, L.DEA N Other Clinician: Referring Provider: Treating Provider/Extender: Arbutus Ped, L.DEA Asencion Partridge in Treatment: 0 Active Problems ICD-10 Encounter Code Description Active Date MDM Diagnosis L89.153 Pressure ulcer of sacral region, stage 3 07/04/2020 No Yes Q85.02 Neurofibromatosis, type 2 07/04/2020 No Yes S14.109S Unspecified injury at unspecified level of cervical spinal cord, sequela 07/04/2020 No Yes Inactive Problems Resolved Problems Electronic Signature(s) Signed: 07/04/2020 6:57:29 PM By: Linton Ham MD Entered By: Linton Ham on 07/04/2020  09:09:32 -------------------------------------------------------------------------------- Progress Note Details Patient Name: Date of Service: Cameron Hale, [name] ID B. 07/04/2020 7:30 A M Medical Record Number: [id number] Patient Account Number: [id number] Date of Birth/Sex: Treating RN: Feb 13, 1976 (44 y.o. Male) Cameron Hale Primary Care Provider: Alroy Dust, L.DEA N Other Clinician: Referring Provider: Treating Provider/Extender: Arbutus Ped, L.DEA Asencion Partridge in Treatment: 0 Subjective Chief Complaint Information obtained from Patient 10/27/2018; patient is here for review of a chronic wound on his left lateral foot 07/04/2020; patient is here for review of a pressure ulcer on the lower sacrum History of Present Illness (HPI) ADMISSION 10/27/2018 This is a very pleasant 44 year old man who is not a diabetic or smoker. 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 He has an area on his plantar left foot that is been there for over a year. He is followed for most of 2019 by Dr. Jacqualyn Posey at [facility]. An MRI done on 12/25/2017 showed no osteomyelitis in this area and in fact by the end of March/19 this had healed over. He tells me he wears AFO braces and he went back to his old brace after this healed in March and the wound reopened by sometime in the summer. He was treated with a course of Keflex in July and more recently has been put on doxycycline. He has been applying Silvadene to this wound since August. Because of his gait ataxia he wears modified shoes with AFO braces. I think these are designed to try and keep him off the lateral part of both feet. He is already been to the people who made his braces for him and there was some adaption placed on the left shoe Past medical history essentially as  listed above. He has recurrent meningiomas and acoustic neuroma for which she has had intracranial surgery. He has peripheral neuropathy apparently secondary to neurofibromatosis type II although I do not have any information on this currently. The patient is on Decadron I think related to intracranial edema. He was recently started on doxycycline by Dr. Jacqualyn Posey I believe on 12/ 6 ABI in our clinic was 0.96 on the right he has easily palpable pulses. 11/03/2018; wound area is improved although he still has some undermining from roughly 7:00 to 2:00 at roughly 0.3 cm. There is no evidence of infection. He comes in today with a Cam walking boot which he is using instead of the shoe with the brace. As noted in my note last week there is absolutely no possibility to offload this man with a total contact cast. We are using silver collagen 11/09/2018; not a lot of improvement. Undermining is from 11-3 o'clock at perhaps 0.5 cm. There is no evidence of infection. Depth is about the same. We have been using silver collagen 11/17/2018; still a worsening situation  today. He presented with denuded skin from the wound on the lateral part of his foot more superiorly. This needed to be removed the wound is expanded. The cause of this is not a 100% clear. I cultured the wound bed but this may all be pressure. He is using a cam boot. We have been using silver collagen. I will change him to silver alginate today 11/24/2018; much better this week. CULTURE from last time grew group B strep. I put him on amoxicillin 500 3 times daily. XRAY suggested cannot exclude early osteomyelitis involving the base of the left fifth metatarsal. I have looked at this myself. This is very subtle. I gave him a healing sandal instead of the cam walker last time he went home and probably fell. He was not heard. He put on his own shoe with an AFO brace and he has had no problems since then. He has felt in the base of this to offload the  area 1/17; continuing much improvement in the condition of the wound at the base of the left fifth metatarsal. Only one small open area remains here. I removed some debris from the wound surface and thick skin and subcutaneous tissue from the circumference. I am hopeful this is on the way to closure. 1/24; considerable decline since last week. The patient says he had a fall on the way to the bathroom and he is uncertain whether this is contributed to it. The area on the left lateral foot at roughly the base of the fifth metatarsal is larger and undermining superiorly. There is a new wound on the left lateral but dorsal foot. The 2 of these do not combine but the whole presentation is somewhat worrisome. I have ordered an MRI of the foot to compare with the one he had done in February that was negative for osteomyelitis in this area. He is on amoxicillin that I gave him for a culture that showed group B strep however he shows me a rash across his upper back and neck that is a faint but somewhat morbilliform rash probably a drug rash. I have told him to come off the amoxicillin 1/31; the patient arrived in clinic last week with a considerable decline in the left foot in fact there was an area more dorsal on the foot that appeared almost to have a connection. I had ordered an MRI and it was supposed to be done today however earlier this week the clinic received a call from the patient's sister reporting marked pain. The MRI was moved up and it was done on 1/27. This did not show a fluid collection or hematoma. There was a soft tissue wound noted over the lateral aspect of the base of the fifth metatarsal without osteomyelitis 2/11; left lateral foot looks better today but still a punched out area on the lateral aspect. We have been using silver alginate. There does not appear to be a good way to offload this area which is his weightbearing surface 2/18; left lateral foot. Less surrounding callus today.  He says he has been offloading this walking very sparingly. We used silver collagen started last week. 2/24 left lateral foot. Surface of the wound looks better. We have been using silver collagen 3/2 left lateral foot. Surface of the wound continues to improve. I think he is more religious and staying off his foot. He is still not gone to [facility] to see if they can do anything to his shoe which according to the patient and  his father is already been adopted. 3/10; left lateral foot. Unfortunately things do not look as well. More depth today at 0.4 cm. Necrotic debris on the base of the wound and still thick callused skin around the wound in spite of the fact that the patient insists he is staying off this religiously. We have not been able to offload this anymore. I did try using a surgical shoe at one point he promptly went home and fell. He is frustrated enough today that he is asking me about a total contact cast again. We have been using silver collagen to the wound Patient returns for the 3 wounds the left first toe and the right first and second toe dorsal surfaces. The left first toe is closed up and healed, the right first toe dorsal surface wound is extensive and appears the same as last time, no purulent drainage or odor reported per nurse, left second toe wound is slightly smaller. He has completed the dicloxacillin as of today. Noted that his culture previously had grown MSSA. We have been using calcium alginate to the wounds. X-rays have been reviewed. ABIs were reviewed as well. 3/17-Patient returns to clinic for his left plantar wound, nursing reports that there has been fair amount of drainage, some maceration around the ulcer area, patient's cultures have come back with Proteus sensitive to most antibiotics. We are using calcium alginate to the wounds and may have to switch to drawtex to address the extra drainage, he will undergo gamma knife for his new brain lesions this coming  week. 3/26; left lateral plantar foot wound. Culture grew Proteus and he was given Cipro. We have been using silver alginate with secondary drawtex. He is making all efforts to offload this part of his foot. 4/2; left lateral plantar foot. Not overtly infected. Still requiring debridement. Using silver alginate but I changed to endoform today 4/16 left plantar foot. Not overtly infected. No major change in this. It has 0.7 cm in depth we have been using endoform since he was last here 2 weeks ago. Reviewed his previous MRI that did not show osteomyelitis at the end of January/20. 4/30; left plantar foot. Not overtly infected but 1 cm in depth. We have been using endoform for about a month now without much improvement. Previous MRI at the end of January did not show osteomyelitis. Each time he comes in he has thick callus around the wound I think this is a pressure issue although with his modified shoe and brace it is hard to get any legitimate offloading of this area 5/7 left lateral plantar foot. Punched out wound with 1 cm of undermining anteriorly. We have been using endoform. He comes in today discussing paying privately for the [employer] and wanting to try a total contact cast. The patient's gait is very abnormal, I have watched this in the past the certainly walks on the outside of his foot. He has a cam boot at home he brought this in today. 5/21-Patient returns for left lateral plantar foot ulcer, this area is thickly callused, there is a small hematoma at the punched-out hole. We have been using cam boot and endoform 5/26; left lateral plantar foot ulcer. Much better. Only a small amount of ulcer actually remains. He has thick callus around the area. We have been using endoform 6/9; left lateral plantar foot this is totally closed this week. He has been using felt offloading and gauze around the wound and using a cam boot exclusively. I had thought  at one point that nothing short of a total  contact cast would heal this although I think he has been more religious and staying off his foot as well. He has an appointment with [facility] in [facility] to review the situation of his offloading. There is no doubt that this is a weightbearing surface for him READMISSION 07/04/2020 Cameron Hale is now a 44 year old man. We have him in this clinic for a prolonged period from December 2019 through June 2019 for treatment of a ulcer on his left foot. He is not a diabetic however he has type II neurofibromatosis has had multiple surgeries and disability secondary to this. Nevertheless we are able to get this to heal out and he was apparently doing quite well. 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 He was admitted electively to have this removed at [facility] in April. 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 He spent a protracted period of time in rehab at [facility] and then rehab at [facility] but he is now back home being cared for by his parents. Unfortunately he does not have much arm function, cannot feed himself. 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 I have not looked over the hospitalizations or surgery at [facility] and any major detail however this summarizes what my understanding of things is currently. He apparently eats well but needs to be fed. His mother is changing the dressings although he has advanced home care. They are using Medihoney alginate. He does not have a Foley catheter he is doubly incontinent. He does not have a pressure relief mattress. He spends a few hours in the morning for breakfast and bathing but the rest of the time is in bed being turned every 2 by his parents Other than the above nothing seems to be particularly different in his history/medical history. There apparently trying to get approval for an oral  chemotherapy drug for him. However his duration of quadriplegia has been extensive enough even if this were to be effective I doubt it would result in any major improvement although I did not talk to him about this in any great detail Patient History Information obtained from Patient. Allergies morphine (Severity: Moderate, Reaction: hives, rash), amoxicillin (Severity: Moderate, Reaction: rash) Family History Diabetes - Paternal Grandparents, No family history of Cancer, Heart Disease, Hereditary Spherocytosis, Hypertension, Kidney Disease, Lung Disease, Seizures, Stroke, Thyroid Problems, Tuberculosis. Social History Never smoker, Marital Status - Married, Alcohol Use - Rarely, Drug Use - No History, Caffeine Use - Daily - coffee. Medical History Eyes Denies history of Cataracts, Glaucoma, Optic Neuritis Ear/Nose/Mouth/Throat Denies history of Chronic sinus problems/congestion, Middle ear problems Hematologic/Lymphatic Denies history of Anemia, Hemophilia, Human Immunodeficiency Virus, Lymphedema, Sickle Cell Disease Respiratory Denies history of Aspiration, Asthma, Chronic Obstructive Pulmonary Disease (COPD), Pneumothorax, Sleep Apnea, Tuberculosis Cardiovascular Denies history of Angina, Arrhythmia, Congestive Heart Failure, Coronary Artery Disease, Deep Vein Thrombosis, Hypertension, Hypotension, Myocardial Infarction, Peripheral Arterial Disease, Peripheral Venous Disease, Phlebitis, Vasculitis Gastrointestinal Denies history of Cirrhosis , Colitis, Crohnoos, Hepatitis A, Hepatitis B, Hepatitis C Endocrine Denies history of Type I Diabetes, Type II Diabetes Genitourinary Denies history of End Stage Renal Disease Immunological Denies history of Lupus Erythematosus, Raynaudoos, Scleroderma Integumentary (Skin) Denies history of History of Burn Musculoskeletal Denies history of Gout, Rheumatoid Arthritis, Osteoarthritis, Osteomyelitis Neurologic Patient has history of  Neuropathy, Quadriplegia, Seizure Disorder Denies history  of Dementia, Paraplegia Oncologic Patient has history of Received Chemotherapy, Received Radiation Psychiatric Patient has history of Confinement Anxiety Denies history of Anorexia/bulimia Hospitalization/Surgery History - spinal surgery. - cerebral tumors removed. - partial left kidney removed. - left fore arm surgery. - cervical laminectomy with fusion and tumor resection. - neck incision exploration. Medical A Surgical History Notes nd Ear/Nose/Mouth/Throat dysphagia Respiratory hx pneumonia Gastrointestinal incontinent Genitourinary benign mass left kidney, incontinence Neurologic neurofibromatosis, neuromyopathy, neoplasm of cerebral meninges, ependymoma of spinal cord, acoustic neuroma Oncologic intracranial meningioma, thoracic meningioma Review of Systems (ROS) Constitutional Symptoms (General Health) Denies complaints or symptoms of Fatigue, Fever, Chills, Marked Weight Change. Eyes Complains or has symptoms of Glasses / Contacts - glasses. Ear/Nose/Mouth/Throat Complains or has symptoms of Chronic sinus problems or rhinitis. Respiratory Denies complaints or symptoms of Chronic or frequent coughs, Shortness of Breath. Cardiovascular Denies complaints or symptoms of Chest pain. Gastrointestinal Denies complaints or symptoms of Frequent diarrhea, Nausea, Vomiting. Endocrine Denies complaints or symptoms of Heat/cold intolerance. Genitourinary Denies complaints or symptoms of Frequent urination. Integumentary (Skin) Complains or has symptoms of Wounds - sacrum. Musculoskeletal Complains or has symptoms of Muscle Weakness. Denies complaints or symptoms of Muscle Pain. Neurologic Complains or has symptoms of Numbness/parasthesias - loss of feeling lower extremities. Psychiatric Complains or has symptoms of Claustrophobia. Denies complaints or symptoms of Suicidal. Objective Constitutional Patient is  hypertensive.. Pulse regular and within target range for patient.Marland Kitchen Respirations regular, non-labored and within target range.. Temperature is normal and within the target range for the patient.Marland Kitchen Appears in no distress. Vitals Time Taken: 8:05 AM, Height: 70 in, Source: Stated, Weight: 140 lbs, Source: Stated, BMI: 20.1, Temperature: 97.6 F, Pulse: 73 bpm, Respiratory Rate: 18 breaths/min, Blood Pressure: 141/95 mmHg. Respiratory work of breathing is normal. Gastrointestinal (GI) Abdomen is soft and non-distended without masses or tenderness.. No liver or spleen enlargement. Psychiatric Patient is awake and responsive. Difficult to gauge his mood wound exam; the patient has a wound on the lower sacrum. I think this is probably a stage III. Very adherent white fibrinous debris over the surface wound exam with okay. General Notes: wound exam; lower sacrum like stage 3. adherent fibrous debris over the surface. debridement with a #3 curette. no surrounding infection Integumentary (Hair, Skin) Wound #3 status is Open. Original cause of wound was Pressure Injury. The wound is located on the Sacrum. The wound measures 2.6cm length x 1.4cm width x 0.1cm depth; 2.859cm^2 area and 0.286cm^3 volume. There is Fat Layer (Subcutaneous Tissue) Exposed exposed. There is no undermining noted. There is a medium amount of serosanguineous drainage noted. The wound margin is flat and intact. There is medium (34-66%) red, pink granulation within the wound bed. There is a medium (34-66%) amount of necrotic tissue within the wound bed including Adherent Slough. Assessment Active Problems ICD-10 Pressure ulcer of sacral region, stage 3 Neurofibromatosis, type 2 Unspecified injury at unspecified level of cervical spinal cord, sequela Procedures Wound #3 Pre-procedure diagnosis of Wound #3 is a Pressure Ulcer located on the Sacrum . There was a Excisional Skin/Subcutaneous Tissue Debridement with a total area of  3.64 sq cm performed by Ricard Dillon., MD. With the following instrument(s): Curette to remove Viable and Non-Viable tissue/material. Material removed includes Subcutaneous Tissue and Slough and. No specimens were taken. A time out was conducted at 08:56, prior to the start of the procedure. A Minimum amount of bleeding was controlled with Pressure. The procedure was tolerated well with a pain level of 0 throughout and  a pain level of 0 following the procedure. Post Debridement Measurements: 2.6cm length x 1.4cm width x 0.1cm depth; 0.286cm^3 volume. Post debridement Stage noted as Category/Stage III. Character of Wound/Ulcer Post Debridement is improved. Post procedure Diagnosis Wound #3: Same as Pre-Procedure Plan Follow-up Appointments: Return Appointment in 2 weeks. - ****[facility] - ROOM 5**** Dressing Change Frequency: Wound #3 Sacrum: Change dressing every day. Skin Barriers/Peri-Wound Care: Wound #3 Sacrum: Skin Prep Wound Cleansing: Wound #3 Sacrum: Clean wound with Normal Saline. - or wound cleanser Primary Wound Dressing: Wound #3 Sacrum: Santyl Ointment - may use Medihoney if Santyl not available Secondary Dressing: Wound #3 Sacrum: Foam Border - or ABD pad and tape Off-Loading: Low air-loss mattress (Group 2) Roho cushion for wheelchair Turn and reposition every 2 hours Home Health: [facility] skilled nursing for wound care. - Advanced The following medication(s) was prescribed: Santyl topical 250 unit/gram ointment 30 30 gm ointment topical daily to wound 1. The best dressing here would be Santyl change daily if it is affordable. The goal of this would be ongoing debridement. If we can get a healthy wound surface here along with the aggressive offloading that is being done we stand a good chance of getting this to close over. 2. We will see if he is eligible for some form of mattress overlay to help with pressure relief although his parents are religiously  turning him. 3. Since last time we saw him he has suffered severe neurologic disability related to an attempt to remove a meningioma at the cervical spine level. This was done at [facility]. I spent 35 minutes in review of this patient's past medical history, face-to-face evaluation and preparation of this record Electronic Signature(s) Signed: 07/04/2020 6:57:29 PM By: Linton Ham MD Entered By: Linton Ham on 07/04/2020 09:21:32 -------------------------------------------------------------------------------- HxROS Details Patient Name: Date of Service: Cameron Hale, [name] ID B. 07/04/2020 7:30 A M Medical Record Number: [id number] Patient Account Number: [id number] Date of Birth/Sex: Treating RN: Apr 13, 1976 (44 y.o. Male) Cameron Hale Primary Care Provider: Alroy Dust, L.DEA N Other Clinician: Referring Provider: Treating Provider/Extender: Arbutus Ped, L.DEA Asencion Partridge in Treatment: 0 Information Obtained From Patient Constitutional Symptoms (General Health) Complaints and Symptoms: Negative for: Fatigue; Fever; Chills; Marked Weight Change Eyes Complaints and Symptoms: Positive for: Glasses / Contacts - glasses Medical History: Negative for: Cataracts; Glaucoma; Optic Neuritis Ear/Nose/Mouth/Throat Complaints and Symptoms: Positive for: Chronic sinus problems or rhinitis Medical History: Negative for: Chronic sinus problems/congestion; Middle ear problems Past Medical History Notes: dysphagia Respiratory Complaints and Symptoms: Negative for: Chronic or frequent coughs; Shortness of Breath Medical History: Negative for: Aspiration; Asthma; Chronic Obstructive Pulmonary Disease (COPD); Pneumothorax; Sleep Apnea; Tuberculosis Past Medical History Notes: hx pneumonia Cardiovascular Complaints and Symptoms: Negative for: Chest pain Medical History: Negative for: Angina; Arrhythmia; Congestive Heart Failure; Coronary Artery Disease; Deep Vein Thrombosis;  Hypertension; Hypotension; Myocardial Infarction; Peripheral Arterial Disease; Peripheral Venous Disease; Phlebitis; Vasculitis Gastrointestinal Complaints and Symptoms: Negative for: Frequent diarrhea; Nausea; Vomiting Medical History: Negative for: Cirrhosis ; Colitis; Crohns; Hepatitis A; Hepatitis B; Hepatitis C Past Medical History Notes: incontinent Endocrine Complaints and Symptoms: Negative for: Heat/cold intolerance Medical History: Negative for: Type I Diabetes; Type II Diabetes Genitourinary Complaints and Symptoms: Negative for: Frequent urination Medical History: Negative for: End Stage Renal Disease Past Medical History Notes: benign mass left kidney, incontinence Integumentary (Skin) Complaints and Symptoms: Positive for: Wounds - sacrum Medical History: Negative for: History of Burn Musculoskeletal Complaints and Symptoms: Positive for: Muscle Weakness Negative for: Muscle  Pain Medical History: Negative for: Gout; Rheumatoid Arthritis; Osteoarthritis; Osteomyelitis Neurologic Complaints and Symptoms: Positive for: Numbness/parasthesias - loss of feeling lower extremities Medical History: Positive for: Neuropathy; Quadriplegia; Seizure Disorder Negative for: Dementia; Paraplegia Past Medical History Notes: neurofibromatosis, neuromyopathy, neoplasm of cerebral meninges, ependymoma of spinal cord, acoustic neuroma Psychiatric Complaints and Symptoms: Positive for: Claustrophobia Negative for: Suicidal Medical History: Positive for: Confinement Anxiety Negative for: Anorexia/bulimia Hematologic/Lymphatic Medical History: Negative for: Anemia; Hemophilia; Human Immunodeficiency Virus; Lymphedema; Sickle Cell Disease Immunological Medical History: Negative for: Lupus Erythematosus; Raynauds; Scleroderma Oncologic Medical History: Positive for: Received Chemotherapy; Received Radiation Past Medical History Notes: intracranial meningioma, thoracic  meningioma Immunizations Pneumococcal Vaccine: Received Pneumococcal Vaccination: No Implantable Devices Yes Hospitalization / Surgery History Type of Hospitalization/Surgery spinal surgery cerebral tumors removed partial left kidney removed left fore arm surgery cervical laminectomy with fusion and tumor resection neck incision exploration Family and Social History Cancer: No; Diabetes: Yes - Paternal Grandparents; Heart Disease: No; Hereditary Spherocytosis: No; Hypertension: No; Kidney Disease: No; Lung Disease: No; Seizures: No; Stroke: No; Thyroid Problems: No; Tuberculosis: No; Never smoker; Marital Status - Married; Alcohol Use: Rarely; Drug Use: No History; Caffeine Use: Daily - coffee; Financial Concerns: No; Food, Clothing or Shelter Needs: No; Support System Lacking: No; Transportation Concerns: No Electronic Signature(s) Signed: 07/04/2020 5:25:51 PM By: Cameron Gouty RN, BSN Signed: 07/04/2020 6:57:29 PM By: Linton Ham MD Entered By: Cameron Hale on 07/04/2020 08:55:21 -------------------------------------------------------------------------------- [facility] Details Patient Name: Date of Service: Cameron Hale, [name] ID B. 07/04/2020 Medical Record Number: [id number] Patient Account Number: [id number] Date of Birth/Sex: Treating RN: 1976/05/13 (44 y.o. Male) Cameron Hale Primary Care Provider: Alroy Dust, L.DEA N Other Clinician: Referring Provider: Treating Provider/Extender: Arbutus Ped, L.DEA Asencion Partridge in Treatment: 0 Diagnosis Coding ICD-10 Codes Code Description [phone] Pressure ulcer of sacral region, stage 3 Q85.02 Neurofibromatosis, type 2 S14.109S Unspecified injury at unspecified level of cervical spinal cord, sequela Facility Procedures CPT4 Code: [id number] Description: [facility] VISIT-LEV 3 EST PT Modifier: 25 Quantity: 1 CPT4 Code: [id number] Description: 11042 - DEB SUBQ TISSUE 20 SQ CM/< ICD-10 Diagnosis Description L89.153  Pressure ulcer of sacral region, stage 3 Modifier: Quantity: 1 Physician Procedures : CPT4 Code Description Modifier [id number] - WC PHYS LEVEL 4 - EST PT 25 ICD-10 Diagnosis Description L89.153 Pressure ulcer of sacral region, stage 3 Q85.02 Neurofibromatosis, type 2 S14.109S Unspecified injury at unspecified level of cervical  spinal cord, sequela Quantity: 1 : [id number] - WC PHYS SUBQ TISS 20 SQ CM ICD-10 Diagnosis Description L89.153 Pressure ulcer of sacral region, stage 3 Quantity: 1 Electronic Signature(s) Signed: 07/04/2020 6:57:29 PM By: Linton Ham MD Signed: 07/06/2020 5:36:57 PM By: Cameron Hurst RN, BSN Entered By: Cameron Hale on 07/04/2020 10:29:16

## 2020-07-15 NOTE — Progress Notes (Signed)
Cameron, Hale ([id number]) Visit Report for 07/04/2020 Allergy List Details Patient Name: Date of Service: Cameron Hale ID B. 07/04/2020 7:30 A M Medical Record Number: [id number] Patient Account Number: [id number] Date of Birth/Sex: Treating RN: 1976-04-21 (44 y.o. Male) Baruch Gouty Primary Care Braylin Xu: Alroy Dust, L.DEA N Other Clinician: Referring Merideth Bosque: Treating Lyon Dumont/Extender: Arbutus Ped, L.DEA N Weeks in Treatment: 0 Allergies Active Allergies morphine Reaction: hives, rash Severity: Moderate amoxicillin Reaction: rash Severity: Moderate Allergy Notes Electronic Signature(s) Signed: 07/04/2020 5:25:51 PM By: Baruch Gouty RN, BSN Entered By: Baruch Gouty on 07/04/2020 08:08:49 -------------------------------------------------------------------------------- Arrival Information Details Patient Name: Date of Service: Cameron Hale, [name] ID B. 07/04/2020 7:30 A M Medical Record Number: [id number] Patient Account Number: [id number] Date of Birth/Sex: Treating RN: 03/26/1976 (43 y.o. Male) Baruch Gouty Primary Care Maycel Riffe: Alroy Dust, L.DEA N Other Clinician: Referring Damiyah Ditmars: Treating Eileen Kangas/Extender: Arbutus Ped, L.DEA Asencion Partridge in Treatment: 0 Visit Information Patient Arrived: Wheel Chair Arrival Time: 07:43 Accompanied By: parents Transfer Assistance: [employer] fast streamer Patient Identification Verified: Yes Secondary Verification Process Completed: Yes Patient Requires Transmission-Based Precautions: No Patient Has Alerts: No History Since Last Visit Any new allergies or adverse reactions: No Had a fall or experienced change in activities of daily living that may affect risk of falls: No Signs or symptoms of abuse/neglect since last visito No Hospitalized since last visit: Yes Has Dressing in Place as Prescribed: Yes Electronic Signature(s) Signed: 07/04/2020 5:25:51 PM By: Baruch Gouty RN, BSN Entered By: Baruch Gouty on  07/04/2020 08:02:28 -------------------------------------------------------------------------------- Clinic Level of Care Assessment Details Patient Name: Date of Service: Cameron Hale, [name] ID B. 07/04/2020 7:30 A M Medical Record Number: [id number] Patient Account Number: [id number] Date of Birth/Sex: Treating RN: 11-02-1976 (43 y.o. Male) Cameron Hale Primary Care Chani Ghanem: Alroy Dust, L.DEA N Other Clinician: Referring Makhi Muzquiz: Treating Cing [place]/Extender: Arbutus Ped, L.DEA N Weeks in Treatment: 0 Clinic Level of Care Assessment Items TOOL 1 Quantity Score X- 1 0 Use when EandM and Procedure is performed on INITIAL visit ASSESSMENTS - Nursing Assessment / Reassessment X- 1 20 General Physical Exam (combine w/ comprehensive assessment (listed just below) when performed on new pt. evals) X- 1 25 Comprehensive Assessment (HX, ROS, Risk Assessments, Wounds Hx, etc.) ASSESSMENTS - Wound and Skin Assessment / Reassessment []  - 0 Dermatologic / Skin Assessment (not related to wound area) ASSESSMENTS - Ostomy and/or Continence Assessment and Care []  - 0 Incontinence Assessment and Management []  - 0 Ostomy Care Assessment and Management (repouching, etc.) PROCESS - Coordination of Care X - Simple Patient / Family Education for ongoing care 1 15 []  - 0 Complex (extensive) Patient / Family Education for ongoing care X- 1 10 Staff obtains Programmer, systems, Records, T Results / Process Orders est X- 1 10 Staff telephones HHA, Nursing Homes / Clarify orders / etc []  - 0 Routine Transfer to another Facility (non-emergent condition) []  - 0 Routine Hospital Admission (non-emergent condition) X- 1 15 New Admissions / Biomedical engineer / Ordering NPWT Apligraf, etc. , []  - 0 Emergency Hospital Admission (emergent condition) PROCESS - Special Needs []  - 0 Pediatric / Minor Patient Management []  - 0 Isolation Patient Management []  - 0 Hearing / Language / Visual special  needs []  - 0 Assessment of Community assistance (transportation, D/C planning, etc.) []  - 0 Additional assistance / Altered mentation []  - 0 Support Surface(s) Assessment (bed, cushion, seat, etc.) INTERVENTIONS - Miscellaneous []  - 0 External ear exam X- 1 10 Patient Transfer (multiple staff / Harrel Lemon  Lift / Similar devices) []  - 0 Simple Staple / Suture removal (25 or less) []  - 0 Complex Staple / Suture removal (26 or more) []  - 0 Hypo/Hyperglycemic Management (do not check if billed separately) []  - 0 Ankle / Brachial Index (ABI) - do not check if billed separately Has the patient been seen at the hospital within the last three years: Yes Total Score: 105 Level Of Care: New/Established - Level 3 Electronic Signature(s) Signed: 07/06/2020 5:36:57 PM By: Cameron Hurst RN, BSN Entered By: Cameron Hale on 07/04/2020 08:59:46 -------------------------------------------------------------------------------- Encounter Discharge Information Details Patient Name: Date of Service: Cameron Hale, [name] ID B. 07/04/2020 7:30 A M Medical Record Number: [id number] Patient Account Number: [id number] Date of Birth/Sex: Treating RN: 19-Oct-1976 (44 y.o. Male) Cameron Hale Primary Care Eleuterio Dollar: Alroy Dust, L.DEA N Other Clinician: Referring Tramaine Snell: Treating Rylann Munford/Extender: Arbutus Ped, L.DEA Asencion Partridge in Treatment: 0 Encounter Discharge Information Items Post Procedure Vitals Discharge Condition: Stable Temperature (F): 97.6 Ambulatory Status: Wheelchair Pulse (bpm): 73 Discharge Destination: Home Respiratory Rate (breaths/min): 18 Transportation: Private Auto Blood Pressure (mmHg): 141/95 Accompanied By: self Schedule Follow-up Appointment: Yes Clinical Summary of Care: Patient Declined Electronic Signature(s) Signed: 07/04/2020 5:06:17 PM By: Cameron Hale Entered By: Cameron Hale on 07/04/2020  09:48:34 -------------------------------------------------------------------------------- Lower Extremity Assessment Details Patient Name: Date of Service: Cameron Hale, [name] ID B. 07/04/2020 7:30 A M Medical Record Number: [id number] Patient Account Number: [id number] Date of Birth/Sex: Treating RN: 1975-12-04 (43 y.o. Male) Baruch Gouty Primary Care Jeanna Giuffre: Alroy Dust, L.DEA N Other Clinician: Referring Shaterra Sanzone: Treating [place]/Extender: Arbutus Ped, L.DEA Asencion Partridge in Treatment: 0 Electronic Signature(s) Signed: 07/04/2020 5:25:51 PM By: Baruch Gouty RN, BSN Entered By: Baruch Gouty on 07/04/2020 08:20:20 -------------------------------------------------------------------------------- Multi Wound Chart Details Patient Name: Date of Service: Cameron Hale, [name] ID B. 07/04/2020 7:30 A M Medical Record Number: [id number] Patient Account Number: [id number] Date of Birth/Sex: Treating RN: 01/01/1976 (43 y.o. Male) Carlene Coria Primary Care Brody Bonneau: Alroy Dust, L.DEA N Other Clinician: Referring Keria Widrig: Treating Jacobie Stamey/Extender: Arbutus Ped, L.DEA N Weeks in Treatment: 0 Vital Signs Height(in): 70 Pulse(bpm): 73 Weight(lbs): 140 Blood Pressure(mmHg): 141/95 Body Mass Index(BMI): 20 Temperature(F): 97.6 Respiratory Rate(breaths/min): 18 Photos: [3:No Photos Sacrum] [N/A:N/A N/A] Wound Location: [3:Pressure Injury] [N/A:N/A] Wounding Event: [3:Pressure Ulcer] [N/A:N/A] Primary Etiology: [3:05/29/2020] [N/A:N/A] Date Acquired: [3:0] [N/A:N/A] Weeks of Treatment: [3:Open] [N/A:N/A] Wound Status: [3:2.6x1.4x0.1] [N/A:N/A] Measurements L x W x D (cm) [3:2.859] [N/A:N/A] A (cm) : rea [3:0.286] [N/A:N/A] Volume (cm) : [3:Category/Stage III] [N/A:N/A] Classification: [3:Medium] [N/A:N/A] Exudate A mount: [3:Serosanguineous] [N/A:N/A] Exudate Type: [3:red, brown] [N/A:N/A] Exudate Color: [3:Flat and Intact] [N/A:N/A] Wound Margin: [3:Medium  (34-66%)] [N/A:N/A] Granulation A mount: [3:Red, Pink] [N/A:N/A] Granulation Quality: [3:Medium (34-66%)] [N/A:N/A] Necrotic A mount: [3:Fat Layer (Subcutaneous Tissue): Yes N/A] Exposed Structures: [3:Fascia: No Tendon: No Muscle: No Joint: No Bone: No Small (1-33%)] [N/A:N/A] Epithelialization: [3:Debridement - Excisional] [N/A:N/A] Debridement: Pre-procedure Verification/Time Out 08:56 [N/A:N/A] Taken: [3:Subcutaneous, Slough] [N/A:N/A] Tissue Debrided: [3:Skin/Subcutaneous Tissue] [N/A:N/A] Level: [3:3.64] [N/A:N/A] Debridement A (sq cm): [3:rea Curette] [N/A:N/A] Instrument: [3:Minimum] [N/A:N/A] Bleeding: [3:Pressure] [N/A:N/A] Hemostasis A chieved: [3:0] [N/A:N/A] Procedural Pain: [3:0] [N/A:N/A] Post Procedural Pain: [3:Procedure was tolerated well] [N/A:N/A] Debridement Treatment Response: [3:2.6x1.4x0.1] [N/A:N/A] Post Debridement Measurements L x W x D (cm) [3:0.286] [N/A:N/A] Post Debridement Volume: (cm) [3:Category/Stage III] [N/A:N/A] Post Debridement Stage: [3:Debridement] [N/A:N/A] Treatment Notes Electronic Signature(s) Signed: 07/04/2020 6:57:29 PM By: Linton Ham MD Signed: 07/14/2020 5:50:16 PM By: Carlene Coria RN Entered By: Linton Ham on 07/04/2020 09:09:39 -------------------------------------------------------------------------------- Multi-Disciplinary Care  Plan Details Patient Name: Date of Service: Cameron Hale ID B. 07/04/2020 7:30 A M Medical Record Number: [id number] Patient Account Number: [id number] Date of Birth/Sex: Treating RN: [birth year] (44 y.o. Male) Cameron Hale Primary Care Birdella Sippel: Alroy Dust, L.DEA N Other Clinician: Referring Audryna Wendt: Treating Eyva Califano/Extender: Arbutus Ped, L.DEA Asencion Partridge in Treatment: 0 Active Inactive Abuse / Safety / Falls / Self Care Management Nursing Diagnoses: Potential for falls Potential for injury related to falls Goals: Patient will not experience any injury related to  falls Date Initiated: 07/04/2020 Target Resolution Date: 08/04/2020 Goal Status: Active Patient/caregiver will verbalize/demonstrate measures taken to prevent injury and/or falls Date Initiated: 07/04/2020 Target Resolution Date: 08/04/2020 Goal Status: Active Interventions: Assess Activities of Daily Living upon admission and as needed Assess fall risk on admission and as needed Assess: immobility, friction, shearing, incontinence upon admission and as needed Assess impairment of mobility on admission and as needed per policy Assess personal safety and home safety (as indicated) on admission and as needed Assess self care needs on admission and as needed Provide education on fall prevention Provide education on personal and home safety Notes: Nutrition Nursing Diagnoses: Potential for alteratiion in Nutrition/Potential for imbalanced nutrition Goals: Patient/caregiver agrees to and verbalizes understanding of need to use nutritional supplements and/or vitamins as prescribed Date Initiated: 07/04/2020 Target Resolution Date: 08/04/2020 Goal Status: Active Interventions: Assess patient nutrition upon admission and as needed per policy Notes: Pressure Nursing Diagnoses: Knowledge deficit related to causes and risk factors for pressure ulcer development Knowledge deficit related to management of pressures ulcers Potential for impaired tissue integrity related to pressure, friction, moisture, and shear Goals: Patient/caregiver will verbalize risk factors for pressure ulcer development Date Initiated: 07/04/2020 Target Resolution Date: 08/04/2020 Goal Status: Active Patient/caregiver will verbalize understanding of pressure ulcer management Date Initiated: 07/04/2020 Target Resolution Date: 08/04/2020 Goal Status: Active Interventions: Assess: immobility, friction, shearing, incontinence upon admission and as needed Assess offloading mechanisms upon admission and as needed Assess  potential for pressure ulcer upon admission and as needed Provide education on pressure ulcers Notes: Wound/Skin Impairment Nursing Diagnoses: Impaired tissue integrity Knowledge deficit related to ulceration/compromised skin integrity Goals: Patient/caregiver will verbalize understanding of skin care regimen Date Initiated: 07/04/2020 Target Resolution Date: 08/04/2020 Goal Status: Active Ulcer/skin breakdown will have a volume reduction of 30% by week 4 Date Initiated: 07/04/2020 Target Resolution Date: 08/04/2020 Goal Status: Active Interventions: Assess patient/caregiver ability to obtain necessary supplies Assess patient/caregiver ability to perform ulcer/skin care regimen upon admission and as needed Assess ulceration(s) every visit Provide education on ulcer and skin care Notes: Electronic Signature(s) Signed: 07/06/2020 5:36:57 PM By: Cameron Hurst RN, BSN Entered By: Cameron Hale on 07/04/2020 08:44:57 -------------------------------------------------------------------------------- Pain Assessment Details Patient Name: Date of Service: Cameron Hale, [name] ID B. 07/04/2020 7:30 A M Medical Record Number: [id number] Patient Account Number: [id number] Date of Birth/Sex: Treating RN: 04/18/1976 (44 y.o. Male) Baruch Gouty Primary Care Quasean Frye: Alroy Dust, L.DEA N Other Clinician: Referring Camay Pedigo: Treating Mckenzie Bove/Extender: Arbutus Ped, L.DEA N Weeks in Treatment: 0 Active Problems Location of Pain Severity and Description of Pain Patient Has Paino No Site Locations Rate the pain. Current Pain Level: 0 Pain Management and Medication Current Pain Management: Electronic Signature(s) Signed: 07/04/2020 5:25:51 PM By: Baruch Gouty RN, BSN Entered By: Baruch Gouty on 07/04/2020 08:20:34 -------------------------------------------------------------------------------- Patient/Caregiver Education Details Patient Name: Date of Service: Cameron Hale, [name] ID  B. 8/17/2021andnbsp7:30 A M Medical Record Number: [id number] Patient Account Number: [id number] Date of Birth/Gender: Treating RN: [birth year] (  44 y.o. Male) Cameron Hale Primary Care Physician: Alroy Dust, L.DEA N Other Clinician: Referring Physician: Treating Physician/Extender: Arbutus Ped, L.DEA Asencion Partridge in Treatment: 0 Education Assessment Education Provided To: Patient Education Topics Provided Pressure: Methods: Explain/Verbal Responses: State content correctly Safety: Methods: Explain/Verbal Responses: State content correctly Wound/Skin Impairment: Methods: Explain/Verbal Responses: State content correctly Electronic Signature(s) Signed: 07/06/2020 5:36:57 PM By: Cameron Hurst RN, BSN Entered By: Cameron Hale on 07/04/2020 08:47:18 -------------------------------------------------------------------------------- Wound Assessment Details Patient Name: Date of Service: Cameron Hale, [name] ID B. 07/04/2020 7:30 A M Medical Record Number: [id number] Patient Account Number: [id number] Date of Birth/Sex: Treating RN: [birth year] (43 y.o. Male) Baruch Gouty Primary Care Karanvir Balderston: Alroy Dust, L.DEA N Other Clinician: Referring Chantrell Apsey: Treating Amrit Cress/Extender: Arbutus Ped, L.DEA N Weeks in Treatment: 0 Wound Status Wound Number: 3 Primary Pressure Ulcer Etiology: Wound Location: Sacrum Wound Open Wounding Event: Pressure Injury Status: Date Acquired: 05/29/2020 Comorbid Neuropathy, Quadriplegia, Seizure Disorder, Received Weeks Of Treatment: 0 History: Chemotherapy, Received Radiation, Confinement Anxiety Clustered Wound: No Photos Wound Measurements Length: (cm) 2.6 Width: (cm) 1.4 Depth: (cm) 0.1 Area: (cm) 2.859 Volume: (cm) 0.286 % Reduction in Area: 0% % Reduction in Volume: 0% Epithelialization: Small (1-33%) Undermining: No Wound Description Classification: Category/Stage III Wound Margin: Flat and Intact Exudate Amount:  Medium Exudate Type: Serosanguineous Exudate Color: red, brown Foul Odor After Cleansing: No Slough/Fibrino Yes Wound Bed Granulation Amount: Medium (34-66%) Exposed Structure Granulation Quality: Red, Pink Fascia Exposed: No Necrotic Amount: Medium (34-66%) Fat Layer (Subcutaneous Tissue) Exposed: Yes Necrotic Quality: Adherent Slough Tendon Exposed: No Muscle Exposed: No Joint Exposed: No Bone Exposed: No Treatment Notes Wound #3 (Sacrum) 1. Cleanse With Wound Cleanser 2. Periwound Care Skin Prep 3. Primary Dressing Applied Santyl 4. Secondary Dressing Foam Border Dressing Electronic Signature(s) Signed: 07/06/2020 5:08:58 PM By: Mikeal Hawthorne EMT/HBOT/SD Signed: 07/06/2020 5:37:54 PM By: Baruch Gouty RN, BSN Previous Signature: 07/04/2020 5:25:51 PM Version By: Baruch Gouty RN, BSN Entered By: Mikeal Hawthorne on 07/06/2020 16:00:17 -------------------------------------------------------------------------------- Vitals Details Patient Name: Date of Service: Cameron Hale, [name] ID B. 07/04/2020 7:30 A M Medical Record Number: [id number] Patient Account Number: [id number] Date of Birth/Sex: Treating RN: 1976/07/22 (44 y.o. Male) Baruch Gouty Primary Care Larron Armor: Alroy Dust, L.DEA N Other Clinician: Referring [place] Hair: Treating Jamell Opfer/Extender: Arbutus Ped, L.DEA Asencion Partridge in Treatment: 0 Vital Signs Time Taken: 08:05 Temperature (F): 97.6 Height (in): 70 Pulse (bpm): 73 Source: Stated Respiratory Rate (breaths/min): 18 Weight (lbs): 140 Blood Pressure (mmHg): 141/95 Source: Stated Reference Range: 80 - 120 mg / dl Body Mass Index (BMI): 20.1 Electronic Signature(s) Signed: 07/04/2020 5:25:51 PM By: Baruch Gouty RN, BSN Entered By: Baruch Gouty on 07/04/2020 08:08:37

## 2020-07-18 ENCOUNTER — Encounter (HOSPITAL_BASED_OUTPATIENT_CLINIC_OR_DEPARTMENT_OTHER): Payer: Medicare Other | Admitting: Internal Medicine

## 2020-07-18 DIAGNOSIS — L89153 Pressure ulcer of sacral region, stage 3: Secondary | ICD-10-CM | POA: Diagnosis not present

## 2020-07-19 ENCOUNTER — Other Ambulatory Visit: Payer: Self-pay

## 2020-07-19 NOTE — Progress Notes (Signed)
[name], [name] ([id number]) Visit Report for 07/18/2020 Arrival Information Details Patient Name: Date of Service: Cameron Hale ID B. 07/18/2020 3:15 PM Medical Record Number: [id number] Patient Account Number: [id number] Date of Birth/Sex: Treating RN: 07-06-1976 (44 y.o. Janyth Contes Primary Care Mack Alvidrez: Alroy Dust, L.DEA N Other Clinician: Referring Dustina Scoggin: Treating Sandeep Radell/Extender: Arbutus Ped, L.DEA Asencion Partridge in Treatment: 2 Visit Information History Since Last Visit Added or deleted any medications: No Patient Arrived: Wheel Chair Any new allergies or adverse reactions: No Arrival Time: 15:44 Had a fall or experienced change in No Accompanied By: mother activities of daily living that may affect Transfer Assistance: Harrel Lemon Lift risk of falls: Patient Identification Verified: Yes Signs or symptoms of abuse/neglect since last visito No Secondary Verification Process Completed: Yes Hospitalized since last visit: No Patient Requires Transmission-Based Precautions: No Implantable device outside of the clinic excluding No Patient Has Alerts: No cellular tissue based products placed in the center since last visit: Has Dressing in Place as Prescribed: Yes Pain Present Now: No Electronic Signature(s) Signed: 07/19/2020 11:03:23 AM By: [name] RN, BSN Entered By: [name] on 07/18/2020 15:44:39 -------------------------------------------------------------------------------- Encounter Discharge Information Details Patient Name: Date of Service: Cameron Hale, [name] ID B. 07/18/2020 3:15 PM Medical Record Number: [id number] Patient Account Number: [id number] Date of Birth/Sex: Treating RN: [birth year] (44 y.o. Marvis Hale Primary Care Falana Clagg: Alroy Dust, L.DEA N Other Clinician: Referring Jurnee Nakayama: Treating Mija Effertz/Extender: Arbutus Ped, L.DEA Asencion Partridge in Treatment: 2 Encounter Discharge Information Items Post Procedure  Vitals Discharge Condition: Stable Temperature (F): 97.5 Ambulatory Status: Wheelchair Pulse (bpm): 74 Discharge Destination: Home Respiratory Rate (breaths/min): 16 Transportation: Other Blood Pressure (mmHg): 106/67 Accompanied By: family members Schedule Follow-up Appointment: Yes Clinical Summary of Care: Patient Declined Electronic Signature(s) Signed: 07/18/2020 5:00:49 PM By: [name] Entered By: [name] on 07/18/2020 16:59:42 -------------------------------------------------------------------------------- Multi Wound Chart Details Patient Name: Date of Service: Cameron Hale, [name] ID B. 07/18/2020 3:15 PM Medical Record Number: [id number] Patient Account Number: [id number] Date of Birth/Sex: Treating RN: [birth year] (43 y.o. Cameron Hale) Carlene Coria Primary Care Aylissa Heinemann: Alroy Dust, L.DEA N Other Clinician: Referring Mahi Zabriskie: Treating Oday Ridings/Extender: Arbutus Ped, L.DEA N Weeks in Treatment: 2 Vital Signs Height(in): 70 Pulse(bpm): 74 Weight(lbs): 140 Blood Pressure(mmHg): 106/67 Body Mass Index(BMI): 20 Temperature(F): 97.5 Respiratory Rate(breaths/min): 16 Photos: [3:No Photos Sacrum] [N/A:N/A N/A] Wound Location: [3:Pressure Injury] [N/A:N/A] Wounding Event: [3:Pressure Ulcer] [N/A:N/A] Primary Etiology: [3:Neuropathy, Quadriplegia, Seizure] [N/A:N/A] Comorbid History: [3:Disorder, Received Chemotherapy, Received Radiation, Confinement Anxiety 05/29/2020] [N/A:N/A] Date Acquired: [3:2] [N/A:N/A] Weeks of Treatment: [3:Open] [N/A:N/A] Wound Status: [3:3x0.8x0.4] [N/A:N/A] Measurements L x W x D (cm) [3:1.885] [N/A:N/A] A (cm) : rea [3:0.754] [N/A:N/A] Volume (cm) : [3:34.10%] [N/A:N/A] % Reduction in A [3:rea: -163.60%] [N/A:N/A] % Reduction in Volume: [3:Category/Stage III] [N/A:N/A] Classification: [3:Medium] [N/A:N/A] Exudate A mount: [3:Serosanguineous] [N/A:N/A] Exudate Type: [3:red, brown] [N/A:N/A] Exudate Color: [3:Flat and  Intact] [N/A:N/A] Wound Margin: [3:Small (1-33%)] [N/A:N/A] Granulation A mount: [3:Pink, Pale] [N/A:N/A] Granulation Quality: [3:Large (67-100%)] [N/A:N/A] Necrotic A mount: [3:Fat Layer (Subcutaneous Tissue): Yes N/A] Exposed Structures: [3:Fascia: No Tendon: No Muscle: No Joint: No Bone: No Small (1-33%)] [N/A:N/A] Epithelialization: [3:Debridement - Excisional] [N/A:N/A] Debridement: Pre-procedure Verification/Time Out 16:09 [N/A:N/A] Taken: [3:Lidocaine 5% topical ointment] [N/A:N/A] Pain Control: [3:Subcutaneous, Slough] [N/A:N/A] Tissue Debrided: [3:Skin/Subcutaneous Tissue] [N/A:N/A] Level: [3:2.4] [N/A:N/A] Debridement A (sq cm): [3:rea Curette] [N/A:N/A] Instrument: [3:Moderate] [N/A:N/A] Bleeding: [3:Pressure] [N/A:N/A] Hemostasis A chieved: [3:0] [N/A:N/A] Procedural Pain: [3:0] [N/A:N/A] Post Procedural Pain: [3:Procedure was tolerated well] [N/A:N/A] Debridement Treatment Response: [  3:3x0.8x0.4] [N/A:N/A] Post Debridement Measurements L x W x D (cm) [3:0.754] [N/A:N/A] Post Debridement Volume: (cm) [3:Category/Stage III] [N/A:N/A] Post Debridement Stage: [3:Debridement] [N/A:N/A] Treatment Notes Wound #3 (Sacrum) 1. Cleanse With Wound Cleanser 2. Periwound Care Skin Prep 3. Primary Dressing Applied Santyl 4. Secondary Dressing Foam Border Dressing Electronic Signature(s) Signed: 07/19/2020 10:37:46 AM By: Carlene Coria RN Entered By: Carlene Coria on 07/18/2020 17:04:40 -------------------------------------------------------------------------------- [place] Details Patient Name: Date of Service: Cameron Hale, [name] ID B. 07/18/2020 3:15 PM Medical Record Number: [id number] Patient Account Number: [id number] Date of Birth/Sex: Treating RN: 13-Jan-1976 (44 y.o. Cameron Hale) Carlene Coria Primary Care Karl Knarr: Alroy Dust, L.DEA N Other Clinician: Referring Niani Mourer: Treating Jaaliyah Lucatero/Extender: Arbutus Ped, L.DEA Asencion Partridge in Treatment: 2 Active  Inactive Abuse / Safety / Falls / Self Care Management Nursing Diagnoses: Potential for falls Potential for injury related to falls Goals: Patient will not experience any injury related to falls Date Initiated: 07/04/2020 Target Resolution Date: 08/04/2020 Goal Status: Active Patient/caregiver will verbalize/demonstrate measures taken to prevent injury and/or falls Date Initiated: 07/04/2020 Target Resolution Date: 08/04/2020 Goal Status: Active Interventions: Assess Activities of Daily Living upon admission and as needed Assess fall risk on admission and as needed Assess: immobility, friction, shearing, incontinence upon admission and as needed Assess impairment of mobility on admission and as needed per policy Assess personal safety and home safety (as indicated) on admission and as needed Assess self care needs on admission and as needed Provide education on fall prevention Provide education on personal and home safety Notes: Nutrition Nursing Diagnoses: Potential for alteratiion in Nutrition/Potential for imbalanced nutrition Goals: Patient/caregiver agrees to and verbalizes understanding of need to use nutritional supplements and/or vitamins as prescribed Date Initiated: 07/04/2020 Target Resolution Date: 08/04/2020 Goal Status: Active Interventions: Assess patient nutrition upon admission and as needed per policy Notes: Pressure Nursing Diagnoses: Knowledge deficit related to causes and risk factors for pressure ulcer development Knowledge deficit related to management of pressures ulcers Potential for impaired tissue integrity related to pressure, friction, moisture, and shear Goals: Patient/caregiver will verbalize risk factors for pressure ulcer development Date Initiated: 07/04/2020 Target Resolution Date: 08/04/2020 Goal Status: Active Patient/caregiver will verbalize understanding of pressure ulcer management Date Initiated: 07/04/2020 Target Resolution Date:  08/04/2020 Goal Status: Active Interventions: Assess: immobility, friction, shearing, incontinence upon admission and as needed Assess offloading mechanisms upon admission and as needed Assess potential for pressure ulcer upon admission and as needed Provide education on pressure ulcers Notes: Wound/Skin Impairment Nursing Diagnoses: Impaired tissue integrity Knowledge deficit related to ulceration/compromised skin integrity Goals: Patient/caregiver will verbalize understanding of skin care regimen Date Initiated: 07/04/2020 Target Resolution Date: 08/04/2020 Goal Status: Active Ulcer/skin breakdown will have a volume reduction of 30% by week 4 Date Initiated: 07/04/2020 Target Resolution Date: 08/04/2020 Goal Status: Active Interventions: Assess patient/caregiver ability to obtain necessary supplies Assess patient/caregiver ability to perform ulcer/skin care regimen upon admission and as needed Assess ulceration(s) every visit Provide education on ulcer and skin care Notes: Electronic Signature(s) Signed: 07/19/2020 10:37:46 AM By: Carlene Coria RN Entered By: Carlene Coria on 07/18/2020 15:27:30 -------------------------------------------------------------------------------- Pain Assessment Details Patient Name: Date of Service: Cameron Hale, Shaune Pascal ID B. 07/18/2020 3:15 PM Medical Record Number: [id number] Patient Account Number: [id number] Date of Birth/Sex: Treating RN: Dec 18, 1975 (44 y.o. Janyth Contes Primary Care Jomar Denz: Alroy Dust, L.DEA N Other Clinician: Referring Larrell Rapozo: Treating Fernanda Twaddell/Extender: Arbutus Ped, L.DEA Asencion Partridge in Treatment: 2 Active Problems Location of Pain Severity and Description of Pain Patient Has Paino  No Site Locations Pain Management and Medication Current Pain Management: Electronic Signature(s) Signed: 07/19/2020 11:03:23 AM By: [name] RN, BSN Entered By: [name] on 07/18/2020  15:45:13 -------------------------------------------------------------------------------- Patient/Caregiver Education Details Patient Name: Date of Service: Cameron Hale, Lenoard Aden 8/31/2021andnbsp3:15 PM Medical Record Number: [id number] Patient Account Number: [id number] Date of Birth/Gender: Treating RN: 1976/04/24 (43 y.o. Cameron Hale) Carlene Coria Primary Care Physician: Alroy Dust, L.DEA N Other Clinician: Referring Physician: Treating Physician/Extender: Arbutus Ped, L.DEA Asencion Partridge in Treatment: 2 Education Assessment Education Provided To: Patient Education Topics Provided Pressure: Methods: Explain/Verbal Responses: State content correctly Wound/Skin Impairment: Methods: Explain/Verbal Responses: State content correctly Electronic Signature(s) Signed: 07/19/2020 10:37:46 AM By: Carlene Coria RN Entered By: Carlene Coria on 07/18/2020 15:27:56 -------------------------------------------------------------------------------- Wound Assessment Details Patient Name: Date of Service: Cameron Hale, Shaune Pascal ID B. 07/18/2020 3:15 PM Medical Record Number: [id number] Patient Account Number: [id number] Date of Birth/Sex: Treating RN: 09-25-1976 (44 y.o. Janyth Contes Primary Care Bobbie Virden: Alroy Dust, L.DEA N Other Clinician: Referring Coyt Govoni: Treating Beatriz Settles/Extender: Arbutus Ped, L.DEA Asencion Partridge in Treatment: 2 Wound Status Wound Number: 3 Primary Pressure Ulcer Etiology: Wound Location: Sacrum Wound Open Wounding Event: Pressure Injury Status: Date Acquired: 05/29/2020 Comorbid Neuropathy, Quadriplegia, Seizure Disorder, Received Weeks Of Treatment: 2 History: Chemotherapy, Received Radiation, Confinement Anxiety Clustered Wound: No Wound Measurements Length: (cm) 3 Width: (cm) 0.8 Depth: (cm) 0.4 Area: (cm) 1.885 Volume: (cm) 0.754 % Reduction in Area: 34.1% % Reduction in Volume: -163.6% Epithelialization: Small (1-33%) Tunneling: No Undermining:  No Wound Description Classification: Category/Stage III Wound Margin: Flat and Intact Exudate Amount: Medium Exudate Type: Serosanguineous Exudate Color: red, brown Foul Odor After Cleansing: No Slough/Fibrino Yes Wound Bed Granulation Amount: Small (1-33%) Exposed Structure Granulation Quality: Pink, Pale Fascia Exposed: No Necrotic Amount: Large (67-100%) Fat Layer (Subcutaneous Tissue) Exposed: Yes Necrotic Quality: Adherent Slough Tendon Exposed: No Muscle Exposed: No Joint Exposed: No Bone Exposed: No Treatment Notes Wound #3 (Sacrum) 1. Cleanse With Wound Cleanser 2. Periwound Care Skin Prep 3. Primary Dressing Applied Santyl 4. Secondary Dressing Foam Border Dressing Electronic Signature(s) Signed: 07/19/2020 11:03:23 AM By: [name] RN, BSN Entered By: [name] on 07/18/2020 15:46:05 -------------------------------------------------------------------------------- Vitals Details Patient Name: Date of Service: Cameron Hale, [name] ID B. 07/18/2020 3:15 PM Medical Record Number: [id number] Patient Account Number: [id number] Date of Birth/Sex: Treating RN: May 07, 1976 (44 y.o. Janyth Contes Primary Care Chimene Salo: Alroy Dust, L.DEA N Other Clinician: Referring Vishaal Strollo: Treating Shivaun Bilello/Extender: Arbutus Ped, L.DEA Asencion Partridge in Treatment: 2 Vital Signs Time Taken: 15:44 Temperature (F): 97.5 Height (in): 70 Pulse (bpm): 74 Weight (lbs): 140 Respiratory Rate (breaths/min): 16 Body Mass Index (BMI): 20.1 Blood Pressure (mmHg): 106/67 Reference Range: 80 - 120 mg / dl Electronic Signature(s) Signed: 07/19/2020 11:03:23 AM By: [name] RN, BSN Entered By: [name] on 07/18/2020 15:45:07

## 2020-07-19 NOTE — Telephone Encounter (Signed)
Refill request for Famotidine. Is this okay? No indication in last note to continue.

## 2020-07-19 NOTE — Progress Notes (Signed)
SERAPHIM, TROW ([id number]) Visit Report for 07/18/2020 Debridement Details Patient Name: Date of Service: Cameron Hale ID B. 07/18/2020 3:15 PM Medical Record Number: [id number] Patient Account Number: [id number] Date of Birth/Sex: Treating RN: July 31, 1976 (43 y.o. [name]) Carlene Coria Primary Care Provider: Alroy Dust, L.DEA N Other Clinician: Referring Provider: Treating Provider/Extender: Arbutus Ped, L.DEA Asencion Partridge in Treatment: 2 Debridement Performed for Assessment: Wound #3 Sacrum Performed By: Physician Ricard Dillon., MD Debridement Type: Debridement Level of Consciousness (Pre-procedure): Awake and Alert Pre-procedure Verification/Time Out Yes - 16:09 Taken: Start Time: 16:09 Pain Control: Lidocaine 5% topical ointment T Area Debrided (L x W): otal 3 (cm) x 0.8 (cm) = 2.4 (cm) Tissue and other material debrided: Viable, Non-Viable, Slough, Subcutaneous, Skin: Dermis , Skin: Epidermis, Slough Level: Skin/Subcutaneous Tissue Debridement Description: Excisional Instrument: Curette Bleeding: Moderate Hemostasis Achieved: Pressure End Time: 16:09 Procedural Pain: 0 Post Procedural Pain: 0 Response to Treatment: Procedure was tolerated well Level of Consciousness (Post- Awake and Alert procedure): Post Debridement Measurements of Total Wound Length: (cm) 3 Stage: Category/Stage III Width: (cm) 0.8 Depth: (cm) 0.4 Volume: (cm) 0.754 Character of Wound/Ulcer Post Debridement: Improved Post Procedure Diagnosis Same as Pre-procedure Electronic Signature(s) Signed: 07/18/2020 5:08:08 PM By: Linton Ham MD Signed: 07/19/2020 10:37:46 AM By: Carlene Coria RN Entered By: Linton Ham on 07/18/2020 16:16:32 -------------------------------------------------------------------------------- HPI Details Patient Name: Date of Service: Cameron Hale, [name] ID B. 07/18/2020 3:15 PM Medical Record Number: [id number] Patient Account Number: [id number] Date of Birth/Sex:  Treating RN: 01-Jan-1976 (43 y.o. Cameron Hale Primary Care Provider: Alroy Dust, L.DEA N Other Clinician: Referring Provider: Treating Provider/Extender: Arbutus Ped, L.DEA Asencion Partridge in Treatment: 2 History of Present Illness HPI Description: ADMISSION 10/27/2018 This is a very pleasant 43 year old man who is not a diabetic or smoker. 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 He has an area on his plantar left foot that is been there for over a year. He is followed for most of 2019 by Dr. Jacqualyn Posey at [facility]. An MRI done on 12/25/2017 showed no osteomyelitis in this area and in fact by the end of March/19 this had healed over. He tells me he wears AFO braces and he went back to his old brace after this healed in March and the wound reopened by sometime in the summer. He was treated with a course of Keflex in July and more recently has been put on doxycycline. He has been applying Silvadene to this wound since August. Because of his gait ataxia he wears modified shoes with AFO braces. I think these are designed to try and keep him off the lateral part of both feet. He is already been to the people who made his braces for him and there was some adaption placed on the left shoe Past medical history essentially as listed above. He has recurrent meningiomas and acoustic neuroma for which she has had intracranial surgery. He has peripheral neuropathy apparently secondary to neurofibromatosis type II although I do not have any information on this currently. The patient is on Decadron I think related to intracranial edema. He was recently started on doxycycline by Dr. Jacqualyn Posey I believe on 12/ 6 ABI in our clinic was 0.96 on the right he has easily palpable pulses. 11/03/2018; wound area is improved  although he still has some undermining from roughly 7:00 to  2:00 at roughly 0.3 cm. There is no evidence of infection. He comes in today with a Cam walking boot which he is using instead of the shoe with the brace. As noted in my note last week there is absolutely no possibility to offload this man with a total contact cast. We are using silver collagen 11/09/2018; not a lot of improvement. Undermining is from 11-3 o'clock at perhaps 0.5 cm. There is no evidence of infection. Depth is about the same. We have been using silver collagen 11/17/2018; still a worsening situation today. He presented with denuded skin from the wound on the lateral part of his foot more superiorly. This needed to be removed the wound is expanded. The cause of this is not a 100% clear. I cultured the wound bed but this may all be pressure. He is using a cam boot. We have been using silver collagen. I will change him to silver alginate today 11/24/2018; much better this week. CULTURE from last time grew group B strep. I put him on amoxicillin 500 3 times daily. XRAY suggested cannot exclude early osteomyelitis involving the base of the left fifth metatarsal. I have looked at this myself. This is very subtle. I gave him a healing sandal instead of the cam walker last time he went home and probably fell. He was not heard. He put on his own shoe with an AFO brace and he has had no problems since then. He has felt in the base of this to offload the area 1/17; continuing much improvement in the condition of the wound at the base of the left fifth metatarsal. Only one small open area remains here. I removed some debris from the wound surface and thick skin and subcutaneous tissue from the circumference. I am hopeful this is on the way to closure. 1/24; considerable decline since last week. The patient says he had a fall on the way to the bathroom and he is uncertain whether this is contributed to it. The area on the left lateral foot  at roughly the base of the fifth metatarsal is larger and undermining superiorly. There is a new wound on the left lateral but dorsal foot. The 2 of these do not combine but the whole presentation is somewhat worrisome. I have ordered an MRI of the foot to compare with the one he had done in February that was negative for osteomyelitis in this area. He is on amoxicillin that I gave him for a culture that showed group B strep however he shows me a rash across his upper back and neck that is a faint but somewhat morbilliform rash probably a drug rash. I have told him to come off the amoxicillin 1/31; the patient arrived in clinic last week with a considerable decline in the left foot in fact there was an area more dorsal on the foot that appeared almost to have a connection. I had ordered an MRI and it was supposed to be done today however earlier this week the clinic received a call from the patient's sister reporting marked pain. The MRI was moved up and it was done on 1/27. This did not show a fluid collection or hematoma. There was a soft tissue wound noted over the lateral aspect of the base of the fifth metatarsal without osteomyelitis 2/11; left lateral foot looks better today but still a punched out area on the lateral aspect. We have been using silver alginate. There does not appear to be a good way to offload  this area which is his weightbearing surface 2/18; left lateral foot. Less surrounding callus today. He says he has been offloading this walking very sparingly. We used silver collagen started last week. 2/24 left lateral foot. Surface of the wound looks better. We have been using silver collagen 3/2 left lateral foot. Surface of the wound continues to improve. I think he is more religious and staying off his foot. He is still not gone to [facility] to see if they can do anything to his shoe which according to the patient and his father is already been adopted. 3/10; left lateral foot.  Unfortunately things do not look as well. More depth today at 0.4 cm. Necrotic debris on the base of the wound and still thick callused skin around the wound in spite of the fact that the patient insists he is staying off this religiously. We have not been able to offload this anymore. I did try using a surgical shoe at one point he promptly went home and fell. He is frustrated enough today that he is asking me about a total contact cast again. We have been using silver collagen to the wound Patient returns for the 3 wounds the left first toe and the right first and second toe dorsal surfaces. The left first toe is closed up and healed, the right first toe dorsal surface wound is extensive and appears the same as last time, no purulent drainage or odor reported per nurse, left second toe wound is slightly smaller. He has completed the dicloxacillin as of today. Noted that his culture previously had grown MSSA. We have been using calcium alginate to the wounds. X-rays have been reviewed. ABIs were reviewed as well. 3/17-Patient returns to clinic for his left plantar wound, nursing reports that there has been fair amount of drainage, some maceration around the ulcer area, patient's cultures have come back with Proteus sensitive to most antibiotics. We are using calcium alginate to the wounds and may have to switch to drawtex to address the extra drainage, he will undergo gamma knife for his new brain lesions this coming week. 3/26; left lateral plantar foot wound. Culture grew Proteus and he was given Cipro. We have been using silver alginate with secondary drawtex. He is making all efforts to offload this part of his foot. 4/2; left lateral plantar foot. Not overtly infected. Still requiring debridement. Using silver alginate but I changed to endoform today 4/16 left plantar foot. Not overtly infected. No major change in this. It has 0.7 cm in depth we have been using endoform since he was last here 2  weeks ago. Reviewed his previous MRI that did not show osteomyelitis at the end of January/20. 4/30; left plantar foot. Not overtly infected but 1 cm in depth. We have been using endoform for about a month now without much improvement. Previous MRI at the end of January did not show osteomyelitis. Each time he comes in he has thick callus around the wound I think this is a pressure issue although with his modified shoe and brace it is hard to get any legitimate offloading of this area 5/7 left lateral plantar foot. Punched out wound with 1 cm of undermining anteriorly. We have been using endoform. He comes in today discussing paying privately for the [facility] and wanting to try a total contact cast. The patient's gait is very abnormal, I have watched this in the past the certainly walks on the outside of his foot. He has a cam boot at home  he brought this in today. 5/21-Patient returns for left lateral plantar foot ulcer, this area is thickly callused, there is a small hematoma at the punched-out hole. We have been using cam boot and endoform 5/26; left lateral plantar foot ulcer. Much better. Only a small amount of ulcer actually remains. He has thick callus around the area. We have been using endoform 6/9; left lateral plantar foot this is totally closed this week. He has been using felt offloading and gauze around the wound and using a cam boot exclusively. I had thought at one point that nothing short of a total contact cast would heal this although I think he has been more religious and staying off his foot as well. He has an appointment with [facility] in [facility] to review the situation of his offloading. There is no doubt that this is a weightbearing surface for him READMISSION 07/04/2020 Mr. Rohlman is now a 44 year old man. We have him in this clinic for a prolonged period from December 2019 through June 2019 for treatment of a ulcer on his left foot. He is not a diabetic however he has type II  neurofibromatosis has had multiple surgeries and disability secondary to this. Nevertheless we are able to get this to heal out and he was apparently doing quite well. 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 He was admitted electively to have this removed at [facility] in April. 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 He spent a protracted period of time in rehab at [facility] and then rehab at [facility] but he is now back home being cared for by his parents. Unfortunately he does not have much arm function, cannot feed himself. 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 I have not looked over the hospitalizations or surgery at [facility] and any major detail however this summarizes what my understanding of things is currently. He apparently eats well but needs to be fed. His mother is changing the dressings although he has advanced home care. They are using Medihoney alginate. He does not have a Foley catheter he is doubly incontinent. He does not have a pressure relief mattress. He spends a few hours in the morning for breakfast and bathing but the rest of the time is in bed being turned every 2 by his parents Other than the above nothing seems to be particularly different in his history/medical history. There apparently trying to get approval for an oral chemotherapy drug for him. However his duration of quadriplegia has been extensive enough even if this were to be effective I doubt it would result in any major improvement although I did not talk to him about this in any great detail 8/31; patient with a difficult pressure ulcer over the lower sacrum. We prescribed Santyl for him 2 weeks ago they are using this daily. Electronic Signature(s) Signed: 07/18/2020 5:08:08 PM By: Linton Ham MD Entered By: Linton Ham on 07/18/2020  16:18:03 -------------------------------------------------------------------------------- Physical Exam Details Patient Name: Date of Service: Cameron Hale, [name] ID B. 07/18/2020 3:15 PM Medical Record Number: [id number] Patient Account Number: [id number] Date of Birth/Sex: Treating RN: [birth year] (43 y.o. Cameron Hale Primary Care Provider: Alroy Dust, L.DEA N Other Clinician: Referring Provider: Treating Provider/Extender: Arbutus Ped, L.DEA Asencion Partridge in Treatment: 2 Constitutional Sitting or standing Blood Pressure is within target range for patient.. Pulse regular and within target  range for patient.Marland Kitchen Respirations regular, non-labored and within target range.. Temperature is normal and within the target range for the patient.Marland Kitchen Appears in no distress. Notes Wound exam; Cameron-shaped wound again 100% covered in very adherent fibrinous surface. Attempted debridement with a #3 curette although I am not exactly sure I am able to get much off this wound area. Hemostasis with direct pressure. There is no evidence of infection around the wound Electronic Signature(s) Signed: 07/18/2020 5:08:08 PM By: Linton Ham MD Entered By: Linton Ham on 07/18/2020 16:18:58 -------------------------------------------------------------------------------- Physician Orders Details Patient Name: Date of Service: Cameron Hale, [name] ID B. 07/18/2020 3:15 PM Medical Record Number: [id number] Patient Account Number: [id number] Date of Birth/Sex: Treating RN: 01-09-1976 (43 y.o. [name]) Carlene Coria Primary Care Provider: Alroy Dust, L.DEA N Other Clinician: Referring Provider: Treating Provider/Extender: Arbutus Ped, L.DEA Asencion Partridge in Treatment: 2 Verbal / Phone Orders: No Diagnosis Coding ICD-10 Coding Code Description L89.153 Pressure ulcer of sacral region, stage 3 Q85.02 Neurofibromatosis, type 2 S14.109S Unspecified injury at unspecified level of cervical spinal cord, sequela Follow-up  Appointments ppointment in 2 weeks. - ****[facility] - ROOM 5**** Return A Dressing Change Frequency Wound #3 Sacrum Change dressing every day. Skin Barriers/Peri-Wound Care Wound #3 Sacrum Skin Prep Wound Cleansing Wound #3 Sacrum Clean wound with Normal Saline. - or wound cleanser Primary Wound Dressing Wound #3 Sacrum Santyl Ointment - may use Medihoney if Santyl not available Secondary Dressing Wound #3 Sacrum Foam Border - or ABD pad and tape Off-Loading Low air-loss mattress (Group 2) Roho cushion for wheelchair Turn and reposition every 2 hours [facility] skilled nursing for wound care. - Advanced Patient Medications llergies: morphine, amoxicillin A Notifications Medication Indication Start End Santyl DOSE 1 - topical 250 unit/gram ointment - 1 ointment topical Electronic Signature(s) Signed: 07/19/2020 10:37:46 AM By: Carlene Coria RN Signed: 07/19/2020 11:48:19 AM By: Linton Ham MD Previous Signature: 07/18/2020 5:08:08 PM Version By: Linton Ham MD Entered By: Carlene Coria on 07/18/2020 17:20:09 Prescription 07/18/2020 -------------------------------------------------------------------------------- Grandinetti, Yechezkel B. Linton Ham MD Patient Name: Provider: September 09, 1976 [id number] Date of Birth: NPI#: [name] [id number] Sex: DEA #: [phone] [id number] Phone #: License #: [place] Patient Address: [place] [address] [place], [place] [address] [place], [place] [address] [phone] Allergies morphine; amoxicillin Medication Medication: Route: Strength: Form: Santyl topical 250 unit/gram ointment Class: TOPICAL/MUCOUS MEMBR./SUBCUT ENZYMES . Dose: Frequency / Time: Indication: 1 1 ointment topical Number of Refills: Number of Units: 0 Generic Substitution: Start Date: End Date: Administered at Facility: Substitution Permitted No Note to Pharmacy: Hand  Signature: Date(s): Electronic Signature(s) Signed: 07/19/2020 10:37:46 AM By: Carlene Coria RN Signed: 07/19/2020 11:48:19 AM By: Linton Ham MD Entered By: Carlene Coria on 07/18/2020 17:20:09 -------------------------------------------------------------------------------- Problem List Details Patient Name: Date of Service: Cameron Hale, [name] ID B. 07/18/2020 3:15 PM Medical Record Number: [id number] Patient Account Number: [id number] Date of Birth/Sex: Treating RN: [birth year] (43 y.o. Cameron Hale Primary Care Provider: Alroy Dust, L.DEA N Other Clinician: Referring Provider: Treating Provider/Extender: Arbutus Ped, L.DEA Asencion Partridge in Treatment: 2 Active Problems ICD-10 Encounter Code Description Active Date MDM Diagnosis L89.153 Pressure ulcer of sacral region, stage 3 07/04/2020 No Yes Q85.02 Neurofibromatosis, type 2 07/04/2020 No Yes S14.109S Unspecified injury at unspecified level of cervical spinal cord, sequela 07/04/2020 No Yes Inactive Problems Resolved Problems Electronic Signature(s) Signed: 07/18/2020 5:08:08 PM By: Linton Ham MD Entered By: Linton Ham on 07/18/2020 16:14:24 -------------------------------------------------------------------------------- Progress  Note Details Patient Name: Date of Service: Cameron Hale ID B. 07/18/2020 3:15 PM Medical Record Number: [id number] Patient Account Number: [id number] Date of Birth/Sex: Treating RN: [birth year] (43 y.o. [name]) Carlene Coria Primary Care Provider: Alroy Dust, L.DEA N Other Clinician: Referring Provider: Treating Provider/Extender: Arbutus Ped, L.DEA Asencion Partridge in Treatment: 2 Subjective History of Present Illness (HPI) ADMISSION 10/27/2018 This is a very pleasant 44 year old man who is not a diabetic or smoker. 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 He has an area on his plantar left foot that is been there for over a year. He is followed for most of 2019 by Dr. Jacqualyn Posey at [facility]. An MRI done on 12/25/2017 showed no osteomyelitis in this area and in fact by the end of March/19 this had healed over. He tells me he wears AFO braces and he went back to his old brace after this healed in March and the wound reopened by sometime in the summer. He was treated with a course of Keflex in July and more recently has been put on doxycycline. He has been applying Silvadene to this wound since August. Because of his gait ataxia he wears modified shoes with AFO braces. I think these are designed to try and keep him off the lateral part of both feet. He is already been to the people who made his braces for him and there was some adaption placed on the left shoe Past medical history essentially as listed above. He has recurrent meningiomas and acoustic neuroma for which she has had intracranial surgery. He has peripheral neuropathy apparently secondary to neurofibromatosis type II although I do not have any information on this currently. The patient is on Decadron I think related to intracranial edema. He was recently started on doxycycline by Dr. Jacqualyn Posey I believe on 12/ 6 ABI in our clinic was 0.96 on the right he has easily palpable pulses. 11/03/2018; wound area is improved although he still has some undermining from roughly 7:00 to 2:00 at roughly 0.3 cm. There is no evidence of infection. He comes in today with a Cam walking boot which he is using instead of the shoe with the brace. As noted in my note last week there is absolutely no possibility to offload this man with a total contact cast. We are using silver collagen 11/09/2018; not a lot of improvement. Undermining is from 11-3 o'clock at perhaps 0.5 cm. There is no evidence of infection. Depth is about the same. We have been  using silver collagen 11/17/2018; still a worsening situation today. He presented with denuded skin from the wound on the lateral part of his foot more superiorly. This needed to be removed the wound is expanded. The cause of this is not a 100% clear. I cultured the wound bed but this may all be pressure. He is using a cam boot. We have been using silver collagen. I will change him to silver alginate today 11/24/2018; much better this week. CULTURE from last time grew group B strep. I put him on amoxicillin 500 3 times daily. XRAY suggested cannot exclude early osteomyelitis involving the base of the left fifth metatarsal. I have looked at this myself. This is very subtle. I gave him a healing sandal instead of the cam walker last time he went home and probably fell. He was not heard. He  put on his own shoe with an AFO brace and he has had no problems since then. He has felt in the base of this to offload the area 1/17; continuing much improvement in the condition of the wound at the base of the left fifth metatarsal. Only one small open area remains here. I removed some debris from the wound surface and thick skin and subcutaneous tissue from the circumference. I am hopeful this is on the way to closure. 1/24; considerable decline since last week. The patient says he had a fall on the way to the bathroom and he is uncertain whether this is contributed to it. The area on the left lateral foot at roughly the base of the fifth metatarsal is larger and undermining superiorly. There is a new wound on the left lateral but dorsal foot. The 2 of these do not combine but the whole presentation is somewhat worrisome. I have ordered an MRI of the foot to compare with the one he had done in February that was negative for osteomyelitis in this area. He is on amoxicillin that I gave him for a culture that showed group B strep however he shows me a rash across his upper back and neck that is a faint but somewhat  morbilliform rash probably a drug rash. I have told him to come off the amoxicillin 1/31; the patient arrived in clinic last week with a considerable decline in the left foot in fact there was an area more dorsal on the foot that appeared almost to have a connection. I had ordered an MRI and it was supposed to be done today however earlier this week the clinic received a call from the patient's sister reporting marked pain. The MRI was moved up and it was done on 1/27. This did not show a fluid collection or hematoma. There was a soft tissue wound noted over the lateral aspect of the base of the fifth metatarsal without osteomyelitis 2/11; left lateral foot looks better today but still a punched out area on the lateral aspect. We have been using silver alginate. There does not appear to be a good way to offload this area which is his weightbearing surface 2/18; left lateral foot. Less surrounding callus today. He says he has been offloading this walking very sparingly. We used silver collagen started last week. 2/24 left lateral foot. Surface of the wound looks better. We have been using silver collagen 3/2 left lateral foot. Surface of the wound continues to improve. I think he is more religious and staying off his foot. He is still not gone to [facility] to see if they can do anything to his shoe which according to the patient and his father is already been adopted. 3/10; left lateral foot. Unfortunately things do not look as well. More depth today at 0.4 cm. Necrotic debris on the base of the wound and still thick callused skin around the wound in spite of the fact that the patient insists he is staying off this religiously. We have not been able to offload this anymore. I did try using a surgical shoe at one point he promptly went home and fell. He is frustrated enough today that he is asking me about a total contact cast again. We have been using silver collagen to the wound Patient returns for the  3 wounds the left first toe and the right first and second toe dorsal surfaces. The left first toe is closed up and healed, the right first toe dorsal  surface wound is extensive and appears the same as last time, no purulent drainage or odor reported per nurse, left second toe wound is slightly smaller. He has completed the dicloxacillin as of today. Noted that his culture previously had grown MSSA. We have been using calcium alginate to the wounds. X-rays have been reviewed. ABIs were reviewed as well. 3/17-Patient returns to clinic for his left plantar wound, nursing reports that there has been fair amount of drainage, some maceration around the ulcer area, patient's cultures have come back with Proteus sensitive to most antibiotics. We are using calcium alginate to the wounds and may have to switch to drawtex to address the extra drainage, he will undergo gamma knife for his new brain lesions this coming week. 3/26; left lateral plantar foot wound. Culture grew Proteus and he was given Cipro. We have been using silver alginate with secondary drawtex. He is making all efforts to offload this part of his foot. 4/2; left lateral plantar foot. Not overtly infected. Still requiring debridement. Using silver alginate but I changed to endoform today 4/16 left plantar foot. Not overtly infected. No major change in this. It has 0.7 cm in depth we have been using endoform since he was last here 2 weeks ago. Reviewed his previous MRI that did not show osteomyelitis at the end of January/20. 4/30; left plantar foot. Not overtly infected but 1 cm in depth. We have been using endoform for about a month now without much improvement. Previous MRI at the end of January did not show osteomyelitis. Each time he comes in he has thick callus around the wound I think this is a pressure issue although with his modified shoe and brace it is hard to get any legitimate offloading of this area 5/7 left lateral plantar foot.  Punched out wound with 1 cm of undermining anteriorly. We have been using endoform. He comes in today discussing paying privately for the [facility] and wanting to try a total contact cast. The patient's gait is very abnormal, I have watched this in the past the certainly walks on the outside of his foot. He has a cam boot at home he brought this in today. 5/21-Patient returns for left lateral plantar foot ulcer, this area is thickly callused, there is a small hematoma at the punched-out hole. We have been using cam boot and endoform 5/26; left lateral plantar foot ulcer. Much better. Only a small amount of ulcer actually remains. He has thick callus around the area. We have been using endoform 6/9; left lateral plantar foot this is totally closed this week. He has been using felt offloading and gauze around the wound and using a cam boot exclusively. I had thought at one point that nothing short of a total contact cast would heal this although I think he has been more religious and staying off his foot as well. He has an appointment with [facility] in [facility] to review the situation of his offloading. There is no doubt that this is a weightbearing surface for him READMISSION 07/04/2020 Mr. Roughton is now a 44 year old man. We have him in this clinic for a prolonged period from December 2019 through June 2019 for treatment of a ulcer on his left foot. He is not a diabetic however he has type II neurofibromatosis has had multiple surgeries and disability secondary to this. Nevertheless we are able to get this to heal out and he was apparently doing quite well. Unfortunately he was discovered to have a tumor somewhere  in his spinal column. He was admitted electively to have this removed at [facility] in April. 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 He spent a protracted period of time in rehab at [facility]  and then rehab at [facility] but he is now back home being cared for by his parents. Unfortunately he does not have much arm function, cannot feed himself. 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 I have not looked over the hospitalizations or surgery at [facility] and any major detail however this summarizes what my understanding of things is currently. He apparently eats well but needs to be fed. His mother is changing the dressings although he has advanced home care. They are using Medihoney alginate. He does not have a Foley catheter he is doubly incontinent. He does not have a pressure relief mattress. He spends a few hours in the morning for breakfast and bathing but the rest of the time is in bed being turned every 2 by his parents Other than the above nothing seems to be particularly different in his history/medical history. There apparently trying to get approval for an oral chemotherapy drug for him. However his duration of quadriplegia has been extensive enough even if this were to be effective I doubt it would result in any major improvement although I did not talk to him about this in any great detail 8/31; patient with a difficult pressure ulcer over the lower sacrum. We prescribed Santyl for him 2 weeks ago they are using this daily. Objective Constitutional Sitting or standing Blood Pressure is within target range for patient.. Pulse regular and within target range for patient.Marland Kitchen Respirations regular, non-labored and within target range.. Temperature is normal and within the target range for the patient.Marland Kitchen Appears in no distress. Vitals Time Taken: 3:44 PM, Height: 70 in, Weight: 140 lbs, BMI: 20.1, Temperature: 97.5 F, Pulse: 74 bpm, Respiratory Rate: 16 breaths/min, Blood Pressure: 106/67 mmHg. General Notes: Wound exam; Cameron-shaped wound again 100% covered in very adherent fibrinous surface. Attempted debridement with a #3 curette although I am not exactly sure I am able to  get much off this wound area. Hemostasis with direct pressure. There is no evidence of infection around the wound Integumentary (Hair, Skin) Wound #3 status is Open. Original cause of wound was Pressure Injury. The wound is located on the Sacrum. The wound measures 3cm length x 0.8cm width x 0.4cm depth; 1.885cm^2 area and 0.754cm^3 volume. There is Fat Layer (Subcutaneous Tissue) exposed. There is no tunneling or undermining noted. There is a medium amount of serosanguineous drainage noted. The wound margin is flat and intact. There is small (1-33%) pink, pale granulation within the wound bed. There is a large (67-100%) amount of necrotic tissue within the wound bed including Adherent Slough. Assessment Active Problems ICD-10 Pressure ulcer of sacral region, stage 3 Neurofibromatosis, type 2 Unspecified injury at unspecified level of cervical spinal cord, sequela Procedures Wound #3 Pre-procedure diagnosis of Wound #3 is a Pressure Ulcer located on the Sacrum . There was a Excisional Skin/Subcutaneous Tissue Debridement with a total area of 2.4 sq cm performed by Ricard Dillon., MD. With the following instrument(s): Curette to remove Viable and Non-Viable tissue/material. Material removed includes Subcutaneous Tissue, Slough, Skin: Dermis, and Skin: Epidermis after achieving pain control using Lidocaine 5% topical ointment. No specimens were taken. A time out was conducted at 16:09, prior to the start of  the procedure. A Moderate amount of bleeding was controlled with Pressure. The procedure was tolerated well with a pain level of 0 throughout and a pain level of 0 following the procedure. Post Debridement Measurements: 3cm length x 0.8cm width x 0.4cm depth; 0.754cm^3 volume. Post debridement Stage noted as Category/Stage III. Character of Wound/Ulcer Post Debridement is improved. Post procedure Diagnosis Wound #3: Same as Pre-Procedure Plan Follow-up Appointments: Return Appointment in  2 weeks. - ****[facility] - ROOM 5**** Dressing Change Frequency: Wound #3 Sacrum: Change dressing every day. Skin Barriers/Peri-Wound Care: Wound #3 Sacrum: Skin Prep Wound Cleansing: Wound #3 Sacrum: Clean wound with Normal Saline. - or wound cleanser Primary Wound Dressing: Wound #3 Sacrum: Santyl Ointment - may use Medihoney if Santyl not available Secondary Dressing: Wound #3 Sacrum: Foam Border - or ABD pad and tape Off-Loading: Low air-loss mattress (Group 2) Roho cushion for wheelchair Turn and reposition every 2 hours Home Health: [facility] skilled nursing for wound care. - Advanced 1. Continue with the Santyl ointment we have written another prescription 2. Border foam change daily. Electronic Signature(s) Signed: 07/18/2020 5:08:08 PM By: Linton Ham MD Entered By: Linton Ham on 07/18/2020 16:19:29 -------------------------------------------------------------------------------- SuperBill Details Patient Name: Date of Service: Cameron Hale, [name] ID B. 07/18/2020 Medical Record Number: [id number] Patient Account Number: [id number] Date of Birth/Sex: Treating RN: 1976/06/13 (43 y.o. [name]) Carlene Coria Primary Care Provider: Alroy Dust, L.DEA N Other Clinician: Referring Provider: Treating Provider/Extender: Arbutus Ped, L.DEA Asencion Partridge in Treatment: 2 Diagnosis Coding ICD-10 Codes Code Description [id number] Pressure ulcer of sacral region, stage 3 Q85.02 Neurofibromatosis, type 2 S14.109S Unspecified injury at unspecified level of cervical spinal cord, sequela Facility Procedures CPT4 Code: [id number] Description: 65790 - DEB SUBQ TISSUE 20 SQ CM/< ICD-10 Diagnosis Description L89.153 Pressure ulcer of sacral region, stage 3 Modifier: Quantity: 1 Physician Procedures : CPT4 Code Description Modifier [id number] - WC PHYS SUBQ TISS 20 SQ CM ICD-10 Diagnosis Description L89.153 Pressure ulcer of sacral region, stage 3 Quantity: 1 Electronic  Signature(s) Signed: 07/18/2020 5:08:08 PM By: Linton Ham MD Entered By: Linton Ham on 07/18/2020 16:19:45

## 2020-07-19 NOTE — Telephone Encounter (Signed)
Yes- can continue- give 1 refill- should usually get from PCP in future- thanks ML

## 2020-07-20 ENCOUNTER — Other Ambulatory Visit: Payer: Self-pay | Admitting: Nurse Practitioner

## 2020-07-21 MED ORDER — FAMOTIDINE 20 MG PO TABS: 20.0000 mg | ORAL_TABLET | Freq: Two times a day (BID) | ORAL | 0 refills | Status: DC

## 2020-07-25 ENCOUNTER — Encounter: Payer: Self-pay | Admitting: Nurse Practitioner

## 2020-07-25 ENCOUNTER — Other Ambulatory Visit: Payer: Self-pay

## 2020-07-25 ENCOUNTER — Other Ambulatory Visit: Payer: Medicare Other | Admitting: Nurse Practitioner

## 2020-07-25 DIAGNOSIS — Z515 Encounter for palliative care: Secondary | ICD-10-CM

## 2020-07-25 DIAGNOSIS — R532 Functional quadriplegia: Secondary | ICD-10-CM

## 2020-07-25 NOTE — Progress Notes (Signed)
Cameron Hale: [phone]  Fax: [phone]  PATIENT NAME: Cameron Hale [address] [place] [facility] [address] [phone] (home)  DOB: 09-12-1976 [name]: [id number]  PRIMARY CARE PROVIDER:    Alroy Dust, L.Marlou Sa, MD,  [place] [employer] [address] [place] [address] [phone]  REFERRING PROVIDER:   Alroy Dust, L.Marlou Sa, [place] [employer] [place],  [place] [address] [phone]  RESPONSIBLE PARTY:   Extended Emergency Contact Information Primary Emergency Contact: Cameron Hale,Cameron Hale Address: [address]          [place] [address] Cameron Hale of [place] Phone: [phone] Mobile Phone: [phone] Relation: Spouse Secondary Emergency Contact: Cameron Hale Mobile Phone: [phone] Relation: Mother  I met face to face with patient and family in home.  ASSESSMENT AND RECOMMENDATIONS:   1. Advance Care Planning/Goals of Care: Goals include to maximize quality of life and symptom management. Visit at the request of Dr. Kyra Leyland for palliative care consult. Visit consisted of building trust and discussions on Palliative Care Medicine as specialized medical care for people living with serious illness, aimed at improving quality of life through symptoms management, assisting with advance care plan and establishing goals of care. Patient's mother and father present at today's visit. Discussed and reviewed chronic disease trajectory with family, as it is progressive and terminal. Family expressed appreciation for education provided and for education on Palliative care and how it differs from Hospice care service. Palliative care will continue to provide support to patient, family and the medical team.   Code Status: After discussion and education regarding code status. Patient elected to be a full Code. Family and patient reiterated desire for full spectrum of care including mechanical ventilation if  needed. Validation provided, patient and family made aware that code status will be regularly reviewed to ensure that it reflects patient's current wishes and desires.  2. Symptom Management: Patient has Neurofibromatosis type 2 (NF2), diagnosed at age 54 years. He has undergone multiple surgeries and radiation treatments for multiple meningiomas, vestibular schwannomas and multiple spinal schwannomas. Patient is bed bound at this time related to progression in disease. Patient has muscle wasting, has sacral ulcer that is followed by wound clinic Dr. Quentin Cornwall. Patient would need an air mattress to provide pressure relieve. Recommendation was made for family to continue to maintain skin integrity, assist with routine turns, maintain safety and prevent falls. Phone call and recommendation made to Dr. Quentin Cornwall office, spoke with Briant Cedar- I was made aware that issue was brought up at last case management meeting, plan in place to order an air mattress for patient.  Dysphagia: Patient on soft diet and receiving therapy with ST. No indication of aspiration noted, denied cough. Family assist with feeding, patient on soft diet. Patient at risk for aspiration related pneumonia. Patient to be monitored for s/s of aspiration such as coughing and spluttering with meals.   3. Follow up Palliative Care Visit: Palliative care will continue to follow for goals of care clarification and symptom management. Return 4 weeks or prn.  4. Family /Caregiver/Community Supports: He is currently living with his parents, who takes care of his ADLs.  5. Cognitive / Functional decline:  Patient is alert and oriented, able to verbalize his desires. Patient is a functional quad at this time. He is dependent on person for all of his ADLS including feeding. Per family, before his last surgery in March 2021, patient was able to drive, do laundry, cook and assist  his wife who is an Environmental consultant principal in a school. Patient currently receives  home health service. He receives assistance with bathing once a week and receives PT,OT, and ST.  I spent 50 minutes providing this consultation, time includes time spent with patient and family, chart review, provider coordination, and documentation. More than 50% of the time in this consultation was spent coordinating communication.   HISTORY OF PRESENT ILLNESS:  Cameron Hale is a 44 y.o. year old male with multiple medical problems including NF2, Seizure disorder, dysphagia on soft diet. Patient was on palliative chemotherapy before his hospitalization in March 2021, but had to stop for surgery. He is been considered for an experimental drug trial with [facility]. Palliative Care was asked to help address goals of care. This is an initial visit.  CODE STATUS: Full Code  PPS: 30%  HOSPICE ELIGIBILITY/DIAGNOSIS: TBD  PAST MEDICAL HISTORY:  Past Medical History:  Diagnosis Date  . NF2 (neurofibromatosis 2) ([place])     SOCIAL HX:  Social History   Tobacco Use  . Smoking status: Never Smoker  . Smokeless tobacco: Never Used  Substance Use Topics  . Alcohol use: Not Currently    Alcohol/week: 0.0 standard drinks   FAMILY HX: No family history on file.  ALLERGIES:  Allergies  Allergen Reactions  . Amoxicillin Itching  . Morphine Itching     PERTINENT MEDICATIONS:  Outpatient Encounter Medications as of 07/25/2020  Medication Sig  . acetaminophen (TYLENOL) 325 MG tablet Take 2 tablets (650 mg total) by mouth every 6 (six) hours as needed for mild pain or fever.  Marland Kitchen ammonium lactate (AMLACTIN) 12 % lotion Apply 1 application topically as needed for dry skin.  . baclofen (LIORESAL) 10 MG tablet Take 1 tablet (10 mg total) by mouth 2 (two) times daily.  . bisacodyl (DULCOLAX) 10 MG suppository Place 1 suppository (10 mg total) rectally daily at 6 PM.  . carboxymethylcellulose (REFRESH TEARS) 0.5 % SOLN Place 1 drop into both eyes daily as needed (For dry eyes).  .  famotidine (PEPCID) 20 MG tablet Take 1 tablet (20 mg total) by mouth 2 (two) times daily.  Marland Kitchen levETIRAcetam (KEPPRA) 500 MG tablet Take 3 tablets (1,500 mg total) by mouth 2 (two) times daily.  . propranolol (INDERAL) 10 MG tablet Take 1 tablet (10 mg total) by mouth 2 (two) times daily.   No facility-administered encounter medications on file as of 07/25/2020.    PHYSICAL EXAM / ROS:   Current and past weights:140lbs down from 155lbs. BMI 20.1kg/m2 General: NAD, frail appearing, thin, coherent and cooperative Cardiovascular: denied chest pain, trace edema to lower legs Pulmonary: no cough, no increased SOB, room air Abdomen: appetite fair, denied constipation, incontinent of bowel GU: denies dysuria, incontinent of urine, family report malodorous urine- UTI work up in process MSK:  flaccid paralysis, non-ambulatory Skin: no rashes on exposed skin Neurological: awake and alert  Jari Favre, DNP, AGPCNP-BC

## 2020-08-01 ENCOUNTER — Encounter (HOSPITAL_BASED_OUTPATIENT_CLINIC_OR_DEPARTMENT_OTHER): Payer: Medicare Other | Admitting: Internal Medicine

## 2020-08-03 ENCOUNTER — Other Ambulatory Visit: Payer: Self-pay

## 2020-08-03 ENCOUNTER — Encounter (HOSPITAL_BASED_OUTPATIENT_CLINIC_OR_DEPARTMENT_OTHER): Payer: Medicare Other | Attending: Internal Medicine | Admitting: Internal Medicine

## 2020-08-03 DIAGNOSIS — L89153 Pressure ulcer of sacral region, stage 3: Secondary | ICD-10-CM | POA: Insufficient documentation

## 2020-08-03 DIAGNOSIS — F419 Anxiety disorder, unspecified: Secondary | ICD-10-CM | POA: Diagnosis not present

## 2020-08-03 DIAGNOSIS — Q8502 Neurofibromatosis, type 2: Secondary | ICD-10-CM | POA: Diagnosis not present

## 2020-08-03 DIAGNOSIS — G40909 Epilepsy, unspecified, not intractable, without status epilepticus: Secondary | ICD-10-CM | POA: Insufficient documentation

## 2020-08-03 DIAGNOSIS — G8250 Quadriplegia, unspecified: Secondary | ICD-10-CM | POA: Insufficient documentation

## 2020-08-03 NOTE — Progress Notes (Signed)
MIKEN, STECHER ([id number]) Visit Report for 08/03/2020 Debridement Details Patient Name: Date of Service: [name] ID B. 08/03/2020 3:15 PM Medical Record Number: [id number] Patient Account Number: [id number] Date of Birth/Sex: Treating RN: 1976-09-10 (44 y.o. Hessie Diener Primary Care Provider: Alroy Dust, L.DEA N Other Clinician: Referring Provider: Treating Provider/Extender: Arbutus Ped, L.DEA Asencion Partridge in Treatment: 4 Debridement Performed for Assessment: Wound #3 Sacrum Performed By: Physician Ricard Dillon., MD Debridement Type: Debridement Level of Consciousness (Pre-procedure): Awake and Alert Pre-procedure Verification/Time Out Yes - 16:00 Taken: Start Time: 16:01 Pain Control: Lidocaine 4% T opical Solution T Area Debrided (L x W): otal 3 (cm) x 1 (cm) = 3 (cm) Tissue and other material debrided: Viable, Non-Viable, Slough, Subcutaneous, Skin: Dermis , Fibrin/Exudate, Slough Level: Skin/Subcutaneous Tissue Debridement Description: Excisional Instrument: Curette Bleeding: Minimum Hemostasis Achieved: Pressure End Time: 16:07 Procedural Pain: 0 Post Procedural Pain: 0 Response to Treatment: Procedure was tolerated well Level of Consciousness (Post- Awake and Alert procedure): Post Debridement Measurements of Total Wound Length: (cm) 3 Stage: Category/Stage III Width: (cm) 1 Depth: (cm) 0.2 Volume: (cm) 0.471 Character of Wound/Ulcer Post Debridement: Improved Post Procedure Diagnosis Same as Pre-procedure Electronic Signature(s) Signed: 08/03/2020 4:33:07 PM By: Linton Ham MD Signed: 08/03/2020 4:55:33 PM By: Deon Pilling Entered By: Linton Ham on 08/03/2020 16:26:10 -------------------------------------------------------------------------------- HPI Details Patient Name: Date of Service: Cameron Hale, [name] ID B. 08/03/2020 3:15 PM Medical Record Number: [id number] Patient Account Number: [id number] Date of Birth/Sex: Treating  RN: May 09, 1976 (43 y.o. Hessie Diener Primary Care Provider: Alroy Dust, L.DEA N Other Clinician: Referring Provider: Treating Provider/Extender: Arbutus Ped, L.DEA Asencion Partridge in Treatment: 4 History of Present Illness HPI Description: ADMISSION 10/27/2018 This is a very pleasant 44 year old man who is not a diabetic or smoker. 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 He has an area on his plantar left foot that is been there for over a year. He is followed for most of 2019 by Dr. Jacqualyn Posey at [facility]. An MRI done on 12/25/2017 showed no osteomyelitis in this area and in fact by the end of March/19 this had healed over. He tells me he wears AFO braces and he went back to his old brace after this healed in March and the wound reopened by sometime in the summer. He was treated with a course of Keflex in July and more recently has been put on doxycycline. He has been applying Silvadene to this wound since August. Because of his gait ataxia he wears modified shoes with AFO braces. I think these are designed to try and keep him off the lateral part of both feet. He is already been to the people who made his braces for him and there was some adaption placed on the left shoe Past medical history essentially as listed above. He has recurrent meningiomas and acoustic neuroma for which she has had intracranial surgery. He has peripheral neuropathy apparently secondary to neurofibromatosis type II although I do not have any information on this currently. The patient is on Decadron I think related to intracranial edema. He was recently started on doxycycline by Dr. Jacqualyn Posey I believe on 12/ 6 ABI in our clinic was 0.96 on the right he has easily palpable pulses. 11/03/2018; wound area is improved although  he still has some undermining from roughly 7:00 to 2:00  at roughly 0.3 cm. There is no evidence of infection. He comes in today with a Cam walking boot which he is using instead of the shoe with the brace. As noted in my note last week there is absolutely no possibility to offload this man with a total contact cast. We are using silver collagen 11/09/2018; not a lot of improvement. Undermining is from 11-3 o'clock at perhaps 0.5 cm. There is no evidence of infection. Depth is about the same. We have been using silver collagen 11/17/2018; still a worsening situation today. He presented with denuded skin from the wound on the lateral part of his foot more superiorly. This needed to be removed the wound is expanded. The cause of this is not a 100% clear. I cultured the wound bed but this may all be pressure. He is using a cam boot. We have been using silver collagen. I will change him to silver alginate today 11/24/2018; much better this week. CULTURE from last time grew group B strep. I put him on amoxicillin 500 3 times daily. XRAY suggested cannot exclude early osteomyelitis involving the base of the left fifth metatarsal. I have looked at this myself. This is very subtle. I gave him a healing sandal instead of the cam walker last time he went home and probably fell. He was not heard. He put on his own shoe with an AFO brace and he has had no problems since then. He has felt in the base of this to offload the area 1/17; continuing much improvement in the condition of the wound at the base of the left fifth metatarsal. Only one small open area remains here. I removed some debris from the wound surface and thick skin and subcutaneous tissue from the circumference. I am hopeful this is on the way to closure. 1/24; considerable decline since last week. The patient says he had a fall on the way to the bathroom and he is uncertain whether this is contributed to it. The area on the left lateral foot at roughly  the base of the fifth metatarsal is larger and undermining superiorly. There is a new wound on the left lateral but dorsal foot. The 2 of these do not combine but the whole presentation is somewhat worrisome. I have ordered an MRI of the foot to compare with the one he had done in February that was negative for osteomyelitis in this area. He is on amoxicillin that I gave him for a culture that showed group B strep however he shows me a rash across his upper back and neck that is a faint but somewhat morbilliform rash probably a drug rash. I have told him to come off the amoxicillin 1/31; the patient arrived in clinic last week with a considerable decline in the left foot in fact there was an area more dorsal on the foot that appeared almost to have a connection. I had ordered an MRI and it was supposed to be done today however earlier this week the clinic received a call from the patient's sister reporting marked pain. The MRI was moved up and it was done on 1/27. This did not show a fluid collection or hematoma. There was a soft tissue wound noted over the lateral aspect of the base of the fifth metatarsal without osteomyelitis 2/11; left lateral foot looks better today but still a punched out area on the lateral aspect. We have been using silver alginate. There does not appear to be a good way to offload this  area which is his weightbearing surface 2/18; left lateral foot. Less surrounding callus today. He says he has been offloading this walking very sparingly. We used silver collagen started last week. 2/24 left lateral foot. Surface of the wound looks better. We have been using silver collagen 3/2 left lateral foot. Surface of the wound continues to improve. I think he is more religious and staying off his foot. He is still not gone to [facility] to see if they can do anything to his shoe which according to the patient and his father is already been adopted. 3/10; left lateral foot. Unfortunately  things do not look as well. More depth today at 0.4 cm. Necrotic debris on the base of the wound and still thick callused skin around the wound in spite of the fact that the patient insists he is staying off this religiously. We have not been able to offload this anymore. I did try using a surgical shoe at one point he promptly went home and fell. He is frustrated enough today that he is asking me about a total contact cast again. We have been using silver collagen to the wound Patient returns for the 3 wounds the left first toe and the right first and second toe dorsal surfaces. The left first toe is closed up and healed, the right first toe dorsal surface wound is extensive and appears the same as last time, no purulent drainage or odor reported per nurse, left second toe wound is slightly smaller. He has completed the dicloxacillin as of today. Noted that his culture previously had grown MSSA. We have been using calcium alginate to the wounds. X-rays have been reviewed. ABIs were reviewed as well. 3/17-Patient returns to clinic for his left plantar wound, nursing reports that there has been fair amount of drainage, some maceration around the ulcer area, patient's cultures have come back with Proteus sensitive to most antibiotics. We are using calcium alginate to the wounds and may have to switch to drawtex to address the extra drainage, he will undergo gamma knife for his new brain lesions this coming week. 3/26; left lateral plantar foot wound. Culture grew Proteus and he was given Cipro. We have been using silver alginate with secondary drawtex. He is making all efforts to offload this part of his foot. 4/2; left lateral plantar foot. Not overtly infected. Still requiring debridement. Using silver alginate but I changed to endoform today 4/16 left plantar foot. Not overtly infected. No major change in this. It has 0.7 cm in depth we have been using endoform since he was last here 2 weeks  ago. Reviewed his previous MRI that did not show osteomyelitis at the end of January/20. 4/30; left plantar foot. Not overtly infected but 1 cm in depth. We have been using endoform for about a month now without much improvement. Previous MRI at the end of January did not show osteomyelitis. Each time he comes in he has thick callus around the wound I think this is a pressure issue although with his modified shoe and brace it is hard to get any legitimate offloading of this area 5/7 left lateral plantar foot. Punched out wound with 1 cm of undermining anteriorly. We have been using endoform. He comes in today discussing paying privately for the [employer] and wanting to try a total contact cast. The patient's gait is very abnormal, I have watched this in the past the certainly walks on the outside of his foot. He has a cam boot at home he  brought this in today. 5/21-Patient returns for left lateral plantar foot ulcer, this area is thickly callused, there is a small hematoma at the punched-out hole. We have been using cam boot and endoform 5/26; left lateral plantar foot ulcer. Much better. Only a small amount of ulcer actually remains. He has thick callus around the area. We have been using endoform 6/9; left lateral plantar foot this is totally closed this week. He has been using felt offloading and gauze around the wound and using a cam boot exclusively. I had thought at one point that nothing short of a total contact cast would heal this although I think he has been more religious and staying off his foot as well. He has an appointment with [facility] in [facility] to review the situation of his offloading. There is no doubt that this is a weightbearing surface for him READMISSION 07/04/2020 Mr. Weirauch is now a 44 year old man. We have him in this clinic for a prolonged period from December 2019 through June 2019 for treatment of a ulcer on his left foot. He is not a diabetic however he has type II  neurofibromatosis has had multiple surgeries and disability secondary to this. Nevertheless we are able to get this to heal out and he was apparently doing quite well. 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 He was admitted electively to have this removed at [facility] in April. 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 He spent a protracted period of time in rehab at [facility] and then rehab at [facility] but he is now back home being cared for by his parents. Unfortunately he does not have much arm function, cannot feed himself. 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 I have not looked over the hospitalizations or surgery at [facility] and any major detail however this summarizes what my understanding of things is currently. He apparently eats well but needs to be fed. His mother is changing the dressings although he has advanced home care. They are using Medihoney alginate. He does not have a Foley catheter he is doubly incontinent. He does not have a pressure relief mattress. He spends a few hours in the morning for breakfast and bathing but the rest of the time is in bed being turned every 2 by his parents Other than the above nothing seems to be particularly different in his history/medical history. There apparently trying to get approval for an oral chemotherapy drug for him. However his duration of quadriplegia has been extensive enough even if this were to be effective I doubt it would result in any major improvement although I did not talk to him about this in any great detail 8/31; patient with a difficult pressure ulcer over the lower sacrum. We prescribed Santyl for him 2 weeks ago they are using this daily. 9/16; patient with a pressure ulcer over the lower sacrum. We have been using Santyl. Electronic Signature(s) Signed: 08/03/2020 4:33:07  PM By: Linton Ham MD Entered By: Linton Ham on 08/03/2020 16:26:41 -------------------------------------------------------------------------------- Physical Exam Details Patient Name: Date of Service: Cameron Hale, [name] ID B. 08/03/2020 3:15 PM Medical Record Number: [id number] Patient Account Number: [id number] Date of Birth/Sex: Treating RN: [birth year] (44 y.o. Hessie Diener Primary Care Provider: Alroy Dust, L.DEA N Other Clinician: Referring Provider: Treating Provider/Extender: Arbutus Ped, L.DEA Asencion Partridge in Treatment: 4 Constitutional Sitting or  standing Blood Pressure is within target range for patient.. Bradycardic but regular. Respirations regular, non-labored and within target range.. Increasingly frail looking man. Minimally responsive. Notes Wound exam; oval-shaped wound again 100% covered in a very adherent fibrinous surface. Used a #5 curette for debridement. I am able to get much better access to the wound and I removed tightly adherent necrotic debris from the surface. Post debridement the wound actually looked fairly healthy. There is no evidence of surrounding infection minor excoriations from transferring sitting etc. nothing looked ominous here. Electronic Signature(s) Signed: 08/03/2020 4:33:07 PM By: Linton Ham MD Entered By: Linton Ham on 08/03/2020 16:29:01 -------------------------------------------------------------------------------- Physician Orders Details Patient Name: Date of Service: Cameron Hale, [name] ID B. 08/03/2020 3:15 PM Medical Record Number: [id number] Patient Account Number: [id number] Date of Birth/Sex: Treating RN: 05/12/1976 (44 y.o. Hessie Diener Primary Care Provider: Alroy Dust, L.DEA N Other Clinician: Referring Provider: Treating Provider/Extender: Arbutus Ped, L.DEA Asencion Partridge in Treatment: 4 Verbal / Phone Orders: No Diagnosis Coding ICD-10 Coding Code Description L89.153 Pressure ulcer of  sacral region, stage 3 Q85.02 Neurofibromatosis, type 2 S14.109S Unspecified injury at unspecified level of cervical spinal cord, sequela Follow-up Appointments ppointment in 2 weeks. - ****[facility] - ROOM 5**** Return A Dressing Change Frequency Wound #3 Sacrum Change dressing three times week. - home health to change three times a week. home health twice a week when patient scheduled for wound center. Skin Barriers/Peri-Wound Care Wound #3 Sacrum Skin Prep Wound Cleansing Wound #3 Sacrum Clean wound with Normal Saline. - or wound cleanser Primary Wound Dressing Wound #3 Sacrum Hydrofera Blue - classic moisten with saline. Secondary Dressing Wound #3 Sacrum Foam Border - or ABD pad and tape Off-Loading Gel mattress overlay (Group 1) Roho cushion for wheelchair Turn and reposition every 2 hours [facility] skilled nursing for wound care. - Advanced Electronic Signature(s) Signed: 08/03/2020 4:33:07 PM By: Linton Ham MD Signed: 08/03/2020 4:55:33 PM By: Deon Pilling Entered By: Deon Pilling on 08/03/2020 16:11:45 -------------------------------------------------------------------------------- Problem List Details Patient Name: Date of Service: Cameron Hale, [name] ID B. 08/03/2020 3:15 PM Medical Record Number: [id number] Patient Account Number: [id number] Date of Birth/Sex: Treating RN: [birth year] (44 y.o. Hessie Diener Primary Care Provider: Alroy Dust, L.DEA N Other Clinician: Referring Provider: Treating Provider/Extender: Arbutus Ped, L.DEA Asencion Partridge in Treatment: 4 Active Problems ICD-10 Encounter Code Description Active Date MDM Diagnosis L89.153 Pressure ulcer of sacral region, stage 3 07/04/2020 No Yes Q85.02 Neurofibromatosis, type 2 07/04/2020 No Yes S14.109S Unspecified injury at unspecified level of cervical spinal cord, sequela 07/04/2020 No Yes Inactive Problems Resolved Problems Electronic Signature(s) Signed: 08/03/2020  4:33:07 PM By: Linton Ham MD Entered By: Linton Ham on 08/03/2020 16:25:50 -------------------------------------------------------------------------------- Progress Note Details Patient Name: Date of Service: Cameron Hale, [name] ID B. 08/03/2020 3:15 PM Medical Record Number: [id number] Patient Account Number: [id number] Date of Birth/Sex: Treating RN: [birth year] (44 y.o. Hessie Diener Primary Care Provider: Alroy Dust, L.DEA N Other Clinician: Referring Provider: Treating Provider/Extender: Arbutus Ped, L.DEA Asencion Partridge in Treatment: 4 Subjective History of Present Illness (HPI) ADMISSION 10/27/2018 This is a very pleasant 44 year old man who is not a diabetic or smoker. 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 He has an area on his plantar left foot that is been there for over a year. He is followed for most of  2019 by Dr. Jacqualyn Posey at [facility]. An MRI done on 12/25/2017 showed no osteomyelitis in this area and in fact by the end of March/19 this had healed over. He tells me he wears AFO braces and he went back to his old brace after this healed in March and the wound reopened by sometime in the summer. He was treated with a course of Keflex in July and more recently has been put on doxycycline. He has been applying Silvadene to this wound since August. Because of his gait ataxia he wears modified shoes with AFO braces. I think these are designed to try and keep him off the lateral part of both feet. He is already been to the people who made his braces for him and there was some adaption placed on the left shoe Past medical history essentially as listed above. He has recurrent meningiomas and acoustic neuroma for which she has had intracranial surgery. He has peripheral neuropathy  apparently secondary to neurofibromatosis type II although I do not have any information on this currently. The patient is on Decadron I think related to intracranial edema. He was recently started on doxycycline by Dr. Jacqualyn Posey I believe on 12/ 6 ABI in our clinic was 0.96 on the right he has easily palpable pulses. 11/03/2018; wound area is improved although he still has some undermining from roughly 7:00 to 2:00 at roughly 0.3 cm. There is no evidence of infection. He comes in today with a Cam walking boot which he is using instead of the shoe with the brace. As noted in my note last week there is absolutely no possibility to offload this man with a total contact cast. We are using silver collagen 11/09/2018; not a lot of improvement. Undermining is from 11-3 o'clock at perhaps 0.5 cm. There is no evidence of infection. Depth is about the same. We have been using silver collagen 11/17/2018; still a worsening situation today. He presented with denuded skin from the wound on the lateral part of his foot more superiorly. This needed to be removed the wound is expanded. The cause of this is not a 100% clear. I cultured the wound bed but this may all be pressure. He is using a cam boot. We have been using silver collagen. I will change him to silver alginate today 11/24/2018; much better this week. CULTURE from last time grew group B strep. I put him on amoxicillin 500 3 times daily. XRAY suggested cannot exclude early osteomyelitis involving the base of the left fifth metatarsal. I have looked at this myself. This is very subtle. I gave him a healing sandal instead of the cam walker last time he went home and probably fell. He was not heard. He put on his own shoe with an AFO brace and he has had no problems since then. He has felt in the base of this to offload the area 1/17; continuing much improvement in the condition of the wound at the base of the left fifth metatarsal. Only one small open area  remains here. I removed some debris from the wound surface and thick skin and subcutaneous tissue from the circumference. I am hopeful this is on the way to closure. 1/24; considerable decline since last week. The patient says he had a fall on the way to the bathroom and he is uncertain whether this is contributed to it. The area on the left lateral foot at roughly the base of the fifth metatarsal is larger and undermining superiorly. There  is a new wound on the left lateral but dorsal foot. The 2 of these do not combine but the whole presentation is somewhat worrisome. I have ordered an MRI of the foot to compare with the one he had done in February that was negative for osteomyelitis in this area. He is on amoxicillin that I gave him for a culture that showed group B strep however he shows me a rash across his upper back and neck that is a faint but somewhat morbilliform rash probably a drug rash. I have told him to come off the amoxicillin 1/31; the patient arrived in clinic last week with a considerable decline in the left foot in fact there was an area more dorsal on the foot that appeared almost to have a connection. I had ordered an MRI and it was supposed to be done today however earlier this week the clinic received a call from the patient's sister reporting marked pain. The MRI was moved up and it was done on 1/27. This did not show a fluid collection or hematoma. There was a soft tissue wound noted over the lateral aspect of the base of the fifth metatarsal without osteomyelitis 2/11; left lateral foot looks better today but still a punched out area on the lateral aspect. We have been using silver alginate. There does not appear to be a good way to offload this area which is his weightbearing surface 2/18; left lateral foot. Less surrounding callus today. He says he has been offloading this walking very sparingly. We used silver collagen started last week. 2/24 left lateral foot. Surface of  the wound looks better. We have been using silver collagen 3/2 left lateral foot. Surface of the wound continues to improve. I think he is more religious and staying off his foot. He is still not gone to [facility] to see if they can do anything to his shoe which according to the patient and his father is already been adopted. 3/10; left lateral foot. Unfortunately things do not look as well. More depth today at 0.4 cm. Necrotic debris on the base of the wound and still thick callused skin around the wound in spite of the fact that the patient insists he is staying off this religiously. We have not been able to offload this anymore. I did try using a surgical shoe at one point he promptly went home and fell. He is frustrated enough today that he is asking me about a total contact cast again. We have been using silver collagen to the wound Patient returns for the 3 wounds the left first toe and the right first and second toe dorsal surfaces. The left first toe is closed up and healed, the right first toe dorsal surface wound is extensive and appears the same as last time, no purulent drainage or odor reported per nurse, left second toe wound is slightly smaller. He has completed the dicloxacillin as of today. Noted that his culture previously had grown MSSA. We have been using calcium alginate to the wounds. X-rays have been reviewed. ABIs were reviewed as well. 3/17-Patient returns to clinic for his left plantar wound, nursing reports that there has been fair amount of drainage, some maceration around the ulcer area, patient's cultures have come back with Proteus sensitive to most antibiotics. We are using calcium alginate to the wounds and may have to switch to drawtex to address the extra drainage, he will undergo gamma knife for his new brain lesions this coming week. 3/26; left lateral  plantar foot wound. Culture grew Proteus and he was given Cipro. We have been using silver alginate with secondary  drawtex. He is making all efforts to offload this part of his foot. 4/2; left lateral plantar foot. Not overtly infected. Still requiring debridement. Using silver alginate but I changed to endoform today 4/16 left plantar foot. Not overtly infected. No major change in this. It has 0.7 cm in depth we have been using endoform since he was last here 2 weeks ago. Reviewed his previous MRI that did not show osteomyelitis at the end of January/20. 4/30; left plantar foot. Not overtly infected but 1 cm in depth. We have been using endoform for about a month now without much improvement. Previous MRI at the end of January did not show osteomyelitis. Each time he comes in he has thick callus around the wound I think this is a pressure issue although with his modified shoe and brace it is hard to get any legitimate offloading of this area 5/7 left lateral plantar foot. Punched out wound with 1 cm of undermining anteriorly. We have been using endoform. He comes in today discussing paying privately for the [employer] and wanting to try a total contact cast. The patient's gait is very abnormal, I have watched this in the past the certainly walks on the outside of his foot. He has a cam boot at home he brought this in today. 5/21-Patient returns for left lateral plantar foot ulcer, this area is thickly callused, there is a small hematoma at the punched-out hole. We have been using cam boot and endoform 5/26; left lateral plantar foot ulcer. Much better. Only a small amount of ulcer actually remains. He has thick callus around the area. We have been using endoform 6/9; left lateral plantar foot this is totally closed this week. He has been using felt offloading and gauze around the wound and using a cam boot exclusively. I had thought at one point that nothing short of a total contact cast would heal this although I think he has been more religious and staying off his foot as well. He has an appointment with [facility]  in [facility] to review the situation of his offloading. There is no doubt that this is a weightbearing surface for him READMISSION 07/04/2020 Mr. Douty is now a 44 year old man. We have him in this clinic for a prolonged period from December 2019 through June 2019 for treatment of a ulcer on his left foot. He is not a diabetic however he has type II neurofibromatosis has had multiple surgeries and disability secondary to this. Nevertheless we are able to get this to heal out and he was apparently doing quite well. 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 He was admitted electively to have this removed at [facility] in April. 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 He spent a protracted period of time in rehab at [facility] and then rehab at [facility] but he is now back home being cared for by his parents. Unfortunately he does not have much arm function, cannot feed himself. 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 I have not looked over the hospitalizations or surgery at [facility] and any major detail however this summarizes what my understanding of things is currently. He apparently eats well but needs to be fed. His mother is changing the dressings although he has  advanced home care. They are using Medihoney alginate. He does not have a Foley catheter he is doubly incontinent. He does not have a pressure relief mattress. He spends a few hours in the morning for breakfast and bathing but the rest of the time is in bed being turned every 2 by his parents Other than the above nothing seems to be particularly different in his history/medical history. There apparently trying to get approval for an oral chemotherapy drug for him. However his duration of quadriplegia has been extensive enough even if this were to be effective I doubt it would result in any  major improvement although I did not talk to him about this in any great detail 8/31; patient with a difficult pressure ulcer over the lower sacrum. We prescribed Santyl for him 2 weeks ago they are using this daily. 9/16; patient with a pressure ulcer over the lower sacrum. We have been using Santyl. Objective Constitutional Sitting or standing Blood Pressure is within target range for patient.. Bradycardic but regular. Respirations regular, non-labored and within target range.. Increasingly frail looking man. Minimally responsive. Vitals Time Taken: 3:30 PM, Height: 70 in, Weight: 140 lbs, BMI: 20.1, Pulse: 57 bpm, Respiratory Rate: 16 breaths/min, Blood Pressure: 106/65 mmHg. General Notes: Wound exam; oval-shaped wound again 100% covered in a very adherent fibrinous surface. Used a #5 curette for debridement. I am able to get much better access to the wound and I removed tightly adherent necrotic debris from the surface. Post debridement the wound actually looked fairly healthy. There is no evidence of surrounding infection minor excoriations from transferring sitting etc. nothing looked ominous here. Integumentary (Hair, Skin) Wound #3 status is Open. Original cause of wound was Pressure Injury. The wound is located on the Sacrum. The wound measures 3cm length x 1cm width x 0.2cm depth; 2.356cm^2 area and 0.471cm^3 volume. There is Fat Layer (Subcutaneous Tissue) exposed. There is no tunneling or undermining noted. There is a medium amount of serosanguineous drainage noted. The wound margin is flat and intact. There is small (1-33%) pink, pale granulation within the wound bed. There is a large (67-100%) amount of necrotic tissue within the wound bed including Adherent Slough. Assessment Active Problems ICD-10 Pressure ulcer of sacral region, stage 3 Neurofibromatosis, type 2 Unspecified injury at unspecified level of cervical spinal cord, sequela Procedures Wound #3 Pre-procedure  diagnosis of Wound #3 is a Pressure Ulcer located on the Sacrum . There was a Excisional Skin/Subcutaneous Tissue Debridement with a total area of 3 sq cm performed by Ricard Dillon., MD. With the following instrument(s): Curette to remove Viable and Non-Viable tissue/material. Material removed includes Subcutaneous Tissue, Slough, Skin: Dermis, and Fibrin/Exudate after achieving pain control using Lidocaine 4% T opical Solution. A time out was conducted at 16:00, prior to the start of the procedure. A Minimum amount of bleeding was controlled with Pressure. The procedure was tolerated well with a pain level of 0 throughout and a pain level of 0 following the procedure. Post Debridement Measurements: 3cm length x 1cm width x 0.2cm depth; 0.471cm^3 volume. Post debridement Stage noted as Category/Stage III. Character of Wound/Ulcer Post Debridement is improved. Post procedure Diagnosis Wound #3: Same as Pre-Procedure Plan Follow-up Appointments: Return Appointment in 2 weeks. - ****[facility] - ROOM 5**** Dressing Change Frequency: Wound #3 Sacrum: Change dressing three times week. - home health to change three times a week. home health twice a week when patient scheduled for wound center. Skin Barriers/Peri-Wound Care: Wound #3 Sacrum: Skin Prep  Wound Cleansing: Wound #3 Sacrum: Clean wound with Normal Saline. - or wound cleanser Primary Wound Dressing: Wound #3 Sacrum: Hydrofera Blue - classic moisten with saline. Secondary Dressing: Wound #3 Sacrum: Foam Border - or ABD pad and tape Off-Loading: Gel mattress overlay (Group 1) Roho cushion for wheelchair Turn and reposition every 2 hours Home Health: [employer] skilled nursing for wound care. - Advanced 1. Change the primary dressing to [facility]. 2. I think Annitta Needs is done his job I do not think any of this hopefully will be necessary. Much easier debridement today 3. He looks increasingly frail I wonder about  nutritional status. We did manage him to get him a gel overlay for the mattress in his bed. 4. We talked about offloading his mother is familiar with this. Electronic Signature(s) Signed: 08/03/2020 4:33:07 PM By: Linton Ham MD Entered By: Linton Ham on 08/03/2020 16:30:03 -------------------------------------------------------------------------------- SuperBill Details Patient Name: Date of Service: Cameron Hale, [name] ID B. 08/03/2020 Medical Record Number: [id number] Patient Account Number: [id number] Date of Birth/Sex: Treating RN: 1976/05/30 (44 y.o. Hessie Diener Primary Care Provider: Alroy Dust, L.DEA N Other Clinician: Referring Provider: Treating Provider/Extender: Arbutus Ped, L.DEA Asencion Partridge in Treatment: 4 Diagnosis Coding ICD-10 Codes Code Description [phone] Pressure ulcer of sacral region, stage 3 Q85.02 Neurofibromatosis, type 2 S14.109S Unspecified injury at unspecified level of cervical spinal cord, sequela Facility Procedures CPT4 Code: [id number] ICD L8 Description: 62 - DEB SUBQ TISSUE 20 SQ CM/< 1 -10 Diagnosis Description 9.153 Pressure ulcer of sacral region, stage 3 Modifier: Quantity: Physician Procedures : CPT4 Code Description Modifier [id number] - WC PHYS SUBQ TISS 20 SQ CM ICD-10 Diagnosis Description L89.153 Pressure ulcer of sacral region, stage 3 Quantity: 1 Electronic Signature(s) Signed: 08/03/2020 4:33:07 PM By: Linton Ham MD Entered By: Linton Ham on 08/03/2020 16:30:27

## 2020-08-07 NOTE — Progress Notes (Signed)
Cameron Hale, Cameron Hale ([id number]) Visit Report for 08/03/2020 Arrival Information Details Patient Name: Date of Service: Cameron Hale ID B. 08/03/2020 3:15 PM Medical Record Number: [id number] Patient Account Number: [id number] Date of Birth/Sex: Treating RN: June 24, 1976 (44 y.o. Janyth Contes Primary Care Bladen Umar: Alroy Dust, L.DEA N Other Clinician: Referring Zayon Trulson: Treating Miciah Shealy/Extender: Arbutus Ped, L.DEA Asencion Partridge in Treatment: 4 Visit Information History Since Last Visit Added or deleted any medications: Yes Patient Arrived: Wheel Chair Any new allergies or adverse reactions: No Arrival Time: 15:29 Had a fall or experienced change in No Accompanied By: mother activities of daily living that may affect Transfer Assistance: Harrel Lemon Lift risk of falls: Patient Identification Verified: Yes Signs or symptoms of abuse/neglect since last visito No Secondary Verification Process Completed: Yes Hospitalized since last visit: No Patient Requires Transmission-Based Precautions: No Implantable device outside of the clinic excluding No Patient Has Alerts: No cellular tissue based products placed in the center since last visit: Has Dressing in Place as Prescribed: Yes Pain Present Now: No Electronic Signature(s) Signed: 08/07/2020 5:09:36 PM By: [name] RN, BSN Entered By: [name] on 08/03/2020 15:29:38 -------------------------------------------------------------------------------- Encounter Discharge Information Details Patient Name: Date of Service: Cameron Hale, [name] ID B. 08/03/2020 3:15 PM Medical Record Number: [id number] Patient Account Number: [id number] Date of Birth/Sex: Treating RN: 10/07/1976 (44 y.o. Hessie Diener Primary Care Albaraa Swingle: Alroy Dust, L.DEA N Other Clinician: Referring Canisha Issac: Treating Dazia Lippold/Extender: Arbutus Ped, L.DEA Asencion Partridge in Treatment: 4 Encounter Discharge Information Items Post Procedure Vitals Discharge  Condition: Stable Temperature (F): 98 Ambulatory Status: Wheelchair Pulse (bpm): 57 Discharge Destination: Home Respiratory Rate (breaths/min): 16 Transportation: Private Auto Blood Pressure (mmHg): 106/65 Accompanied By: mom Schedule Follow-up Appointment: Yes Clinical Summary of Care: Patient Declined Electronic Signature(s) Signed: 08/03/2020 4:37:04 PM By: Mikeal Hawthorne EMT/HBOT/SD Entered By: Mikeal Hawthorne on 08/03/2020 16:36:40 -------------------------------------------------------------------------------- Multi Wound Chart Details Patient Name: Date of Service: Cameron Hale, [name] ID B. 08/03/2020 3:15 PM Medical Record Number: [id number] Patient Account Number: [id number] Date of Birth/Sex: Treating RN: 02-Feb-1976 (44 y.o. Hessie Diener Primary Care Kenishia Plack: Alroy Dust, L.DEA N Other Clinician: Referring Zorianna Taliaferro: Treating Toddy Boyd/Extender: Arbutus Ped, L.DEA Asencion Partridge in Treatment: 4 Vital Signs Height(in): 70 Pulse(bpm): 57 Weight(lbs): 140 Blood Pressure(mmHg): 106/65 Body Mass Index(BMI): 20 Temperature(F): Respiratory Rate(breaths/min): 16 Photos: [3:No [place] [N/A:N/A N/A] Wound Location: [3:Pressure Injury] [N/A:N/A] Wounding Event: [3:Pressure Ulcer] [N/A:N/A] Primary Etiology: [3:Neuropathy, Quadriplegia, Seizure] [N/A:N/A] Comorbid History: [3:Disorder, Received Chemotherapy, Received Radiation, Confinement Anxiety 05/29/2020] [N/A:N/A] Date Acquired: [3:4] [N/A:N/A] Weeks of Treatment: [3:Open] [N/A:N/A] Wound Status: [3:3x1x0.2] [N/A:N/A] Measurements L x W x D (cm) [3:2.356] [N/A:N/A] A (cm) : rea [3:0.471] [N/A:N/A] Volume (cm) : [3:17.60%] [N/A:N/A] % Reduction in A [3:rea: -64.70%] [N/A:N/A] % Reduction in Volume: [3:Category/Stage III] [N/A:N/A] Classification: [3:Medium] [N/A:N/A] Exudate A mount: [3:Serosanguineous] [N/A:N/A] Exudate Type: [3:red, brown] [N/A:N/A] Exudate Color: [3:Flat and Intact] [N/A:N/A] Wound  Margin: [3:Small (1-33%)] [N/A:N/A] Granulation A mount: [3:Pink, Pale] [N/A:N/A] Granulation Quality: [3:Large (67-100%)] [N/A:N/A] Necrotic A mount: [3:Fat Layer (Subcutaneous Tissue): Yes N/A] Exposed Structures: [3:Fascia: No Tendon: No Muscle: No Joint: No Bone: No Small (1-33%)] [N/A:N/A] Epithelialization: [3:Debridement - Excisional] [N/A:N/A] Debridement: Pre-procedure Verification/Time Out 16:00 [N/A:N/A] Taken: [3:Lidocaine 4% Topical Solution] [N/A:N/A] Pain Control: [3:Subcutaneous, Slough] [N/A:N/A] Tissue Debrided: [3:Skin/Subcutaneous Tissue] [N/A:N/A] Level: [3:3] [N/A:N/A] Debridement A (sq cm): [3:rea Curette] [N/A:N/A] Instrument: [3:Minimum] [N/A:N/A] Bleeding: [3:Pressure] [N/A:N/A] Hemostasis A chieved: [3:0] [N/A:N/A] Procedural Pain: [3:0] [N/A:N/A] Post Procedural Pain: [3:Procedure was tolerated well] [N/A:N/A] Debridement Treatment Response: [  3:3x1x0.2] [N/A:N/A] Post Debridement Measurements L x W x D (cm) [3:0.471] [N/A:N/A] Post Debridement Volume: (cm) [3:Category/Stage III] [N/A:N/A] Post Debridement Stage: [3:Debridement] [N/A:N/A] Treatment Notes Electronic Signature(s) Signed: 08/03/2020 4:33:07 PM By: Linton Ham MD Signed: 08/03/2020 4:55:33 PM By: Deon Pilling Signed: 08/03/2020 4:55:33 PM By: Deon Pilling Entered By: Linton Ham on 08/03/2020 16:25:57 -------------------------------------------------------------------------------- Pain Assessment Details Patient Name: Date of Service: Cameron Hale, [name] ID B. 08/03/2020 3:15 PM Medical Record Number: [id number] Patient Account Number: [id number] Date of Birth/Sex: Treating RN: 12-25-1975 (44 y.o. Janyth Contes Primary Care Morayma Godown: Alroy Dust, L.DEA N Other Clinician: Referring Massey Ruhland: Treating Lanina Larranaga/Extender: Arbutus Ped, L.DEA Asencion Partridge in Treatment: 4 Active Problems Location of Pain Severity and Description of Pain Patient Has Paino No Site Locations Pain  Management and Medication Current Pain Management: Electronic Signature(s) Signed: 08/07/2020 5:09:36 PM By: [name] RN, BSN Entered By: [name] on 08/03/2020 15:42:00 -------------------------------------------------------------------------------- Wound Assessment Details Patient Name: Date of Service: Cameron Hale, [name] ID B. 08/03/2020 3:15 PM Medical Record Number: [id number] Patient Account Number: [id number] Date of Birth/Sex: Treating RN: [birth year] (44 y.o. Janyth Contes Primary Care Athira Janowicz: Alroy Dust, L.DEA N Other Clinician: Referring Imanuel Pruiett: Treating Avid Guillette/Extender: Arbutus Ped, L.DEA Asencion Partridge in Treatment: 4 Wound Status Wound Number: 3 Primary Pressure Ulcer Etiology: Wound Location: Sacrum Wound Open Wounding Event: Pressure Injury Status: Date Acquired: 05/29/2020 Comorbid Neuropathy, Quadriplegia, Seizure Disorder, Received Weeks Of Treatment: 4 History: Chemotherapy, Received Radiation, Confinement Anxiety Clustered Wound: No Photos Photo Uploaded By: Mikeal Hawthorne on 08/04/2020 11:19:57 Wound Measurements Length: (cm) 3 Width: (cm) 1 Depth: (cm) 0.2 Area: (cm) 2.356 Volume: (cm) 0.471 % Reduction in Area: 17.6% % Reduction in Volume: -64.7% Epithelialization: Small (1-33%) Tunneling: No Undermining: No Wound Description Classification: Category/Stage III Wound Margin: Flat and Intact Exudate Amount: Medium Exudate Type: Serosanguineous Exudate Color: red, brown Foul Odor After Cleansing: No Slough/Fibrino Yes Wound Bed Granulation Amount: Small (1-33%) Exposed Structure Granulation Quality: Pink, Pale Fascia Exposed: No Necrotic Amount: Large (67-100%) Fat Layer (Subcutaneous Tissue) Exposed: Yes Necrotic Quality: Adherent Slough Tendon Exposed: No Muscle Exposed: No Joint Exposed: No Bone Exposed: No Treatment Notes Wound #3 (Sacrum) 2. Periwound Care Skin Prep 3. Primary Dressing Applied Hydrofera  Blue 4. Secondary Dressing Foam Border Dressing Electronic Signature(s) Signed: 08/07/2020 5:09:36 PM By: [name] RN, BSN Entered By: [name] on 08/03/2020 15:51:23 -------------------------------------------------------------------------------- Vitals Details Patient Name: Date of Service: Cameron Hale, [name] ID B. 08/03/2020 3:15 PM Medical Record Number: [id number] Patient Account Number: [id number] Date of Birth/Sex: Treating RN: [birth year] (44 y.o. Janyth Contes Primary Care Cleburn Maiolo: Alroy Dust, L.DEA N Other Clinician: Referring Cray Monnin: Treating Lemmie Vanlanen/Extender: Arbutus Ped, L.DEA Asencion Partridge in Treatment: 4 Vital Signs Time Taken: 15:30 Pulse (bpm): 57 Height (in): 70 Respiratory Rate (breaths/min): 16 Weight (lbs): 140 Blood Pressure (mmHg): 106/65 Body Mass Index (BMI): 20.1 Reference Range: 80 - 120 mg / dl Electronic Signature(s) Signed: 08/07/2020 5:09:36 PM By: [name] RN, BSN Entered By: [name] on 08/03/2020 15:41:54

## 2020-08-08 ENCOUNTER — Inpatient Hospital Stay (HOSPITAL_COMMUNITY)
Admission: EM | Admit: 2020-08-08 | Discharge: 2020-09-08 | DRG: 091 | Disposition: A | Discharge status: Home Health | Payer: Medicare Other | Attending: Internal Medicine | Admitting: Internal Medicine

## 2020-08-08 ENCOUNTER — Emergency Department (HOSPITAL_COMMUNITY): Payer: Medicare Other

## 2020-08-08 ENCOUNTER — Encounter (HOSPITAL_COMMUNITY): Payer: Self-pay

## 2020-08-08 ENCOUNTER — Other Ambulatory Visit: Payer: Self-pay

## 2020-08-08 DIAGNOSIS — R4701 Aphasia: Secondary | ICD-10-CM | POA: Diagnosis not present

## 2020-08-08 DIAGNOSIS — R532 Functional quadriplegia: Secondary | ICD-10-CM | POA: Diagnosis present

## 2020-08-08 DIAGNOSIS — J90 Pleural effusion, not elsewhere classified: Secondary | ICD-10-CM | POA: Diagnosis present

## 2020-08-08 DIAGNOSIS — G936 Cerebral edema: Secondary | ICD-10-CM | POA: Diagnosis present

## 2020-08-08 DIAGNOSIS — M7989 Other specified soft tissue disorders: Secondary | ICD-10-CM | POA: Diagnosis not present

## 2020-08-08 DIAGNOSIS — R579 Shock, unspecified: Secondary | ICD-10-CM | POA: Diagnosis not present

## 2020-08-08 DIAGNOSIS — E785 Hyperlipidemia, unspecified: Secondary | ICD-10-CM | POA: Diagnosis present

## 2020-08-08 DIAGNOSIS — Z885 Allergy status to narcotic agent status: Secondary | ICD-10-CM

## 2020-08-08 DIAGNOSIS — L89152 Pressure ulcer of sacral region, stage 2: Secondary | ICD-10-CM | POA: Diagnosis present

## 2020-08-08 DIAGNOSIS — J3089 Other allergic rhinitis: Secondary | ICD-10-CM | POA: Diagnosis present

## 2020-08-08 DIAGNOSIS — R1310 Dysphagia, unspecified: Secondary | ICD-10-CM | POA: Diagnosis not present

## 2020-08-08 DIAGNOSIS — H9041 Sensorineural hearing loss, unilateral, right ear, with unrestricted hearing on the contralateral side: Secondary | ICD-10-CM | POA: Diagnosis present

## 2020-08-08 DIAGNOSIS — K5900 Constipation, unspecified: Secondary | ICD-10-CM

## 2020-08-08 DIAGNOSIS — Z9289 Personal history of other medical treatment: Secondary | ICD-10-CM

## 2020-08-08 DIAGNOSIS — J9811 Atelectasis: Secondary | ICD-10-CM | POA: Diagnosis present

## 2020-08-08 DIAGNOSIS — I639 Cerebral infarction, unspecified: Secondary | ICD-10-CM | POA: Diagnosis not present

## 2020-08-08 DIAGNOSIS — Z4682 Encounter for fitting and adjustment of non-vascular catheter: Secondary | ICD-10-CM

## 2020-08-08 DIAGNOSIS — I6330 Cerebral infarction due to thrombosis of unspecified cerebral artery: Secondary | ICD-10-CM | POA: Diagnosis not present

## 2020-08-08 DIAGNOSIS — G929 Unspecified toxic encephalopathy: Secondary | ICD-10-CM | POA: Diagnosis present

## 2020-08-08 DIAGNOSIS — R401 Stupor: Secondary | ICD-10-CM

## 2020-08-08 DIAGNOSIS — Z515 Encounter for palliative care: Secondary | ICD-10-CM | POA: Diagnosis not present

## 2020-08-08 DIAGNOSIS — Z880 Allergy status to penicillin: Secondary | ICD-10-CM

## 2020-08-08 DIAGNOSIS — E43 Unspecified severe protein-calorie malnutrition: Secondary | ICD-10-CM | POA: Diagnosis present

## 2020-08-08 DIAGNOSIS — G9341 Metabolic encephalopathy: Secondary | ICD-10-CM | POA: Diagnosis present

## 2020-08-08 DIAGNOSIS — R569 Unspecified convulsions: Secondary | ICD-10-CM | POA: Diagnosis present

## 2020-08-08 DIAGNOSIS — Z0189 Encounter for other specified special examinations: Secondary | ICD-10-CM

## 2020-08-08 DIAGNOSIS — I6389 Other cerebral infarction: Secondary | ICD-10-CM | POA: Diagnosis not present

## 2020-08-08 DIAGNOSIS — Z905 Acquired absence of kidney: Secondary | ICD-10-CM

## 2020-08-08 DIAGNOSIS — Q8502 Neurofibromatosis, type 2: Secondary | ICD-10-CM | POA: Diagnosis not present

## 2020-08-08 DIAGNOSIS — I959 Hypotension, unspecified: Secondary | ICD-10-CM | POA: Diagnosis present

## 2020-08-08 DIAGNOSIS — Z79899 Other long term (current) drug therapy: Secondary | ICD-10-CM

## 2020-08-08 DIAGNOSIS — R54 Age-related physical debility: Secondary | ICD-10-CM | POA: Diagnosis present

## 2020-08-08 DIAGNOSIS — H908 Mixed conductive and sensorineural hearing loss, unspecified: Secondary | ICD-10-CM | POA: Diagnosis present

## 2020-08-08 DIAGNOSIS — D696 Thrombocytopenia, unspecified: Secondary | ICD-10-CM | POA: Diagnosis present

## 2020-08-08 DIAGNOSIS — J690 Pneumonitis due to inhalation of food and vomit: Secondary | ICD-10-CM | POA: Diagnosis present

## 2020-08-08 DIAGNOSIS — R1311 Dysphagia, oral phase: Secondary | ICD-10-CM

## 2020-08-08 DIAGNOSIS — G931 Anoxic brain damage, not elsewhere classified: Secondary | ICD-10-CM | POA: Diagnosis present

## 2020-08-08 DIAGNOSIS — J189 Pneumonia, unspecified organism: Secondary | ICD-10-CM | POA: Diagnosis not present

## 2020-08-08 DIAGNOSIS — R7401 Elevation of levels of liver transaminase levels: Secondary | ICD-10-CM

## 2020-08-08 DIAGNOSIS — J9621 Acute and chronic respiratory failure with hypoxia: Secondary | ICD-10-CM | POA: Diagnosis present

## 2020-08-08 DIAGNOSIS — T380X5A Adverse effect of glucocorticoids and synthetic analogues, initial encounter: Secondary | ICD-10-CM | POA: Diagnosis not present

## 2020-08-08 DIAGNOSIS — G8220 Paraplegia, unspecified: Secondary | ICD-10-CM

## 2020-08-08 DIAGNOSIS — Z9689 Presence of other specified functional implants: Secondary | ICD-10-CM

## 2020-08-08 DIAGNOSIS — R680 Hypothermia, not associated with low environmental temperature: Secondary | ICD-10-CM | POA: Diagnosis present

## 2020-08-08 DIAGNOSIS — Z20822 Contact with and (suspected) exposure to covid-19: Secondary | ICD-10-CM | POA: Diagnosis present

## 2020-08-08 DIAGNOSIS — G40901 Epilepsy, unspecified, not intractable, with status epilepticus: Secondary | ICD-10-CM | POA: Diagnosis not present

## 2020-08-08 DIAGNOSIS — I361 Nonrheumatic tricuspid (valve) insufficiency: Secondary | ICD-10-CM | POA: Diagnosis not present

## 2020-08-08 DIAGNOSIS — I63529 Cerebral infarction due to unspecified occlusion or stenosis of unspecified anterior cerebral artery: Secondary | ICD-10-CM | POA: Diagnosis present

## 2020-08-08 DIAGNOSIS — G9340 Encephalopathy, unspecified: Secondary | ICD-10-CM | POA: Diagnosis not present

## 2020-08-08 DIAGNOSIS — R0603 Acute respiratory distress: Secondary | ICD-10-CM

## 2020-08-08 DIAGNOSIS — J9601 Acute respiratory failure with hypoxia: Secondary | ICD-10-CM | POA: Diagnosis not present

## 2020-08-08 DIAGNOSIS — G709 Myoneural disorder, unspecified: Secondary | ICD-10-CM | POA: Diagnosis present

## 2020-08-08 DIAGNOSIS — Z87440 Personal history of urinary (tract) infections: Secondary | ICD-10-CM

## 2020-08-08 DIAGNOSIS — R9401 Abnormal electroencephalogram [EEG]: Secondary | ICD-10-CM | POA: Diagnosis not present

## 2020-08-08 DIAGNOSIS — Z981 Arthrodesis status: Secondary | ICD-10-CM | POA: Diagnosis not present

## 2020-08-08 DIAGNOSIS — H9071 Mixed conductive and sensorineural hearing loss, unilateral, right ear, with unrestricted hearing on the contralateral side: Secondary | ICD-10-CM

## 2020-08-08 DIAGNOSIS — E871 Hypo-osmolality and hyponatremia: Secondary | ICD-10-CM | POA: Diagnosis not present

## 2020-08-08 DIAGNOSIS — R4189 Other symptoms and signs involving cognitive functions and awareness: Secondary | ICD-10-CM | POA: Diagnosis not present

## 2020-08-08 DIAGNOSIS — R140 Abdominal distension (gaseous): Secondary | ICD-10-CM | POA: Diagnosis not present

## 2020-08-08 DIAGNOSIS — R443 Hallucinations, unspecified: Secondary | ICD-10-CM | POA: Diagnosis not present

## 2020-08-08 DIAGNOSIS — R001 Bradycardia, unspecified: Secondary | ICD-10-CM | POA: Diagnosis present

## 2020-08-08 DIAGNOSIS — G8250 Quadriplegia, unspecified: Secondary | ICD-10-CM | POA: Diagnosis not present

## 2020-08-08 DIAGNOSIS — Z7189 Other specified counseling: Secondary | ICD-10-CM

## 2020-08-08 DIAGNOSIS — Z4659 Encounter for fitting and adjustment of other gastrointestinal appliance and device: Secondary | ICD-10-CM

## 2020-08-08 DIAGNOSIS — J9620 Acute and chronic respiratory failure, unspecified whether with hypoxia or hypercapnia: Secondary | ICD-10-CM | POA: Diagnosis not present

## 2020-08-08 DIAGNOSIS — R739 Hyperglycemia, unspecified: Secondary | ICD-10-CM | POA: Diagnosis not present

## 2020-08-08 DIAGNOSIS — E876 Hypokalemia: Secondary | ICD-10-CM | POA: Diagnosis not present

## 2020-08-08 DIAGNOSIS — J9600 Acute respiratory failure, unspecified whether with hypoxia or hypercapnia: Secondary | ICD-10-CM | POA: Diagnosis not present

## 2020-08-08 DIAGNOSIS — E870 Hyperosmolality and hypernatremia: Secondary | ICD-10-CM | POA: Diagnosis not present

## 2020-08-08 DIAGNOSIS — T17800S Unspecified foreign body in other parts of respiratory tract causing asphyxiation, sequela: Secondary | ICD-10-CM

## 2020-08-08 DIAGNOSIS — R4182 Altered mental status, unspecified: Secondary | ICD-10-CM | POA: Diagnosis present

## 2020-08-08 DIAGNOSIS — R4020 Unspecified coma: Secondary | ICD-10-CM | POA: Diagnosis not present

## 2020-08-08 DIAGNOSIS — M5001 Cervical disc disorder with myelopathy,  high cervical region: Secondary | ICD-10-CM | POA: Diagnosis not present

## 2020-08-08 DIAGNOSIS — W449XXS Unspecified foreign body entering into or through a natural orifice, sequela: Secondary | ICD-10-CM

## 2020-08-08 DIAGNOSIS — R231 Pallor: Secondary | ICD-10-CM | POA: Diagnosis present

## 2020-08-08 DIAGNOSIS — E162 Hypoglycemia, unspecified: Secondary | ICD-10-CM | POA: Diagnosis not present

## 2020-08-08 DIAGNOSIS — I4891 Unspecified atrial fibrillation: Secondary | ICD-10-CM | POA: Diagnosis not present

## 2020-08-08 DIAGNOSIS — J9690 Respiratory failure, unspecified, unspecified whether with hypoxia or hypercapnia: Secondary | ICD-10-CM

## 2020-08-08 DIAGNOSIS — E8770 Fluid overload, unspecified: Secondary | ICD-10-CM | POA: Diagnosis present

## 2020-08-08 DIAGNOSIS — N390 Urinary tract infection, site not specified: Secondary | ICD-10-CM | POA: Diagnosis present

## 2020-08-08 DIAGNOSIS — Z9911 Dependence on respirator [ventilator] status: Secondary | ICD-10-CM | POA: Diagnosis not present

## 2020-08-08 DIAGNOSIS — R7303 Prediabetes: Secondary | ICD-10-CM | POA: Diagnosis present

## 2020-08-08 DIAGNOSIS — R339 Retention of urine, unspecified: Secondary | ICD-10-CM | POA: Diagnosis not present

## 2020-08-08 DIAGNOSIS — I63423 Cerebral infarction due to embolism of bilateral anterior cerebral arteries: Secondary | ICD-10-CM | POA: Diagnosis not present

## 2020-08-08 DIAGNOSIS — Z4800 Encounter for change or removal of nonsurgical wound dressing: Secondary | ICD-10-CM | POA: Diagnosis not present

## 2020-08-08 DIAGNOSIS — D509 Iron deficiency anemia, unspecified: Secondary | ICD-10-CM | POA: Diagnosis present

## 2020-08-08 DIAGNOSIS — D320 Benign neoplasm of cerebral meninges: Secondary | ICD-10-CM | POA: Diagnosis present

## 2020-08-08 DIAGNOSIS — Z01818 Encounter for other preprocedural examination: Secondary | ICD-10-CM

## 2020-08-08 DIAGNOSIS — Z87898 Personal history of other specified conditions: Secondary | ICD-10-CM

## 2020-08-08 DIAGNOSIS — Z6822 Body mass index (BMI) 22.0-22.9, adult: Secondary | ICD-10-CM

## 2020-08-08 DIAGNOSIS — H548 Legal blindness, as defined in USA: Secondary | ICD-10-CM | POA: Diagnosis present

## 2020-08-08 DIAGNOSIS — I10 Essential (primary) hypertension: Secondary | ICD-10-CM | POA: Diagnosis present

## 2020-08-08 HISTORY — DX: Unspecified convulsions: R56.9

## 2020-08-08 HISTORY — DX: Quadriplegia, unspecified: G82.50

## 2020-08-08 HISTORY — DX: Unspecified hearing loss, unspecified ear: H91.90

## 2020-08-08 HISTORY — DX: Pressure ulcer of unspecified site, unspecified stage: L89.90

## 2020-08-08 HISTORY — DX: Benign neoplasm of cranial nerves: D33.3

## 2020-08-08 LAB — CBC WITH DIFFERENTIAL/PLATELET
Abs Immature Granulocytes: 0.29 10*3/uL — ABNORMAL HIGH (ref 0.00–0.07)
Basophils Absolute: 0 10*3/uL (ref 0.0–0.1)
Basophils Relative: 0 %
Eosinophils Absolute: 0 10*3/uL (ref 0.0–0.5)
Eosinophils Relative: 0 %
HCT: 30.3 % — ABNORMAL LOW (ref 39.0–52.0)
Hemoglobin: 9.5 g/dL — ABNORMAL LOW (ref 13.0–17.0)
Immature Granulocytes: 3 %
Lymphocytes Relative: 8 %
Lymphs Abs: 0.8 10*3/uL (ref 0.7–4.0)
MCH: 26.8 pg (ref 26.0–34.0)
MCHC: 31.4 g/dL (ref 30.0–36.0)
MCV: 85.6 fL (ref 80.0–100.0)
Monocytes Absolute: 0.4 10*3/uL (ref 0.1–1.0)
Monocytes Relative: 4 %
Neutro Abs: 8.8 10*3/uL — ABNORMAL HIGH (ref 1.7–7.7)
Neutrophils Relative %: 85 %
Platelets: 113 10*3/uL — ABNORMAL LOW (ref 150–400)
RBC: 3.54 MIL/uL — ABNORMAL LOW (ref 4.22–5.81)
RDW: 17.5 % — ABNORMAL HIGH (ref 11.5–15.5)
WBC: 10.3 10*3/uL (ref 4.0–10.5)
nRBC: 0 % (ref 0.0–0.2)

## 2020-08-08 LAB — URINALYSIS, ROUTINE W REFLEX MICROSCOPIC
Bilirubin Urine: NEGATIVE
Glucose, UA: NEGATIVE mg/dL
Hgb urine dipstick: NEGATIVE
Ketones, ur: NEGATIVE mg/dL
Nitrite: NEGATIVE
Protein, ur: NEGATIVE mg/dL
Specific Gravity, Urine: 1.016 (ref 1.005–1.030)
pH: 5 (ref 5.0–8.0)

## 2020-08-08 LAB — COMPREHENSIVE METABOLIC PANEL
ALT: 233 U/L — ABNORMAL HIGH (ref 0–44)
AST: 163 U/L — ABNORMAL HIGH (ref 15–41)
Albumin: 1.7 g/dL — ABNORMAL LOW (ref 3.5–5.0)
Alkaline Phosphatase: 102 U/L (ref 38–126)
Anion gap: 5 (ref 5–15)
BUN: 23 mg/dL — ABNORMAL HIGH (ref 6–20)
CO2: 25 mmol/L (ref 22–32)
Calcium: 7.7 mg/dL — ABNORMAL LOW (ref 8.9–10.3)
Chloride: 111 mmol/L (ref 98–111)
Creatinine, Ser: <0.3 mg/dL — ABNORMAL LOW (ref 0.61–1.24)
Glucose, Bld: 62 mg/dL — ABNORMAL LOW (ref 70–99)
Potassium: 3.7 mmol/L (ref 3.5–5.1)
Sodium: 141 mmol/L (ref 135–145)
Total Bilirubin: 0.3 mg/dL (ref 0.3–1.2)
Total Protein: 4.4 g/dL — ABNORMAL LOW (ref 6.5–8.1)

## 2020-08-08 LAB — BLOOD GAS, ARTERIAL
Acid-base deficit: 0.9 mmol/L (ref 0.0–2.0)
Bicarbonate: 23.1 mmol/L (ref 20.0–28.0)
FIO2: 24
O2 Saturation: 97.4 %
Patient temperature: 31.7
pCO2 arterial: 40.9 mmHg (ref 32.0–48.0)
pH, Arterial: 7.367 (ref 7.350–7.450)
pO2, Arterial: 85.9 mmHg (ref 83.0–108.0)

## 2020-08-08 LAB — CK: Total CK: 30 U/L — ABNORMAL LOW (ref 49–397)

## 2020-08-08 LAB — SARS CORONAVIRUS 2 BY RT PCR (HOSPITAL ORDER, PERFORMED IN ~~LOC~~ HOSPITAL LAB): SARS Coronavirus 2: NEGATIVE

## 2020-08-08 LAB — RAPID URINE DRUG SCREEN, HOSP PERFORMED
Amphetamines: NOT DETECTED
Barbiturates: NOT DETECTED
Benzodiazepines: NOT DETECTED
Cocaine: NOT DETECTED
Opiates: NOT DETECTED
Tetrahydrocannabinol: NOT DETECTED

## 2020-08-08 LAB — PROTIME-INR
INR: 1 (ref 0.8–1.2)
Prothrombin Time: 12.6 seconds (ref 11.4–15.2)

## 2020-08-08 LAB — LACTIC ACID, PLASMA
Lactic Acid, Venous: 0.8 mmol/L (ref 0.5–1.9)
Lactic Acid, Venous: 0.6 mmol/L (ref 0.5–1.9)

## 2020-08-08 LAB — CBG MONITORING, ED: Glucose-Capillary: 122 mg/dL — ABNORMAL HIGH (ref 70–99)

## 2020-08-08 LAB — APTT: aPTT: 39 seconds — ABNORMAL HIGH (ref 24–36)

## 2020-08-08 MED ORDER — DEXTROSE 50 % IV SOLN
25.0000 mL | Freq: Once | INTRAVENOUS | Status: AC
  Administered 2020-08-08: 25 mL via INTRAVENOUS
  Filled 2020-08-08: qty 50

## 2020-08-08 MED ORDER — SODIUM CHLORIDE 0.9 % IV SOLN
2.0000 g | Freq: Once | INTRAVENOUS | Status: AC
  Administered 2020-08-08: 2 g via INTRAVENOUS
  Filled 2020-08-08: qty 2

## 2020-08-08 MED ORDER — SODIUM CHLORIDE 0.9 % IV SOLN
2.0000 g | Freq: Three times a day (TID) | INTRAVENOUS | Status: DC
  Administered 2020-08-08 – 2020-08-13 (×14): 2 g via INTRAVENOUS
  Filled 2020-08-08 (×14): qty 2

## 2020-08-08 MED ORDER — VANCOMYCIN HCL 750 MG/150ML IV SOLN
750.0000 mg | Freq: Once | INTRAVENOUS | Status: AC
  Administered 2020-08-08: 750 mg via INTRAVENOUS
  Filled 2020-08-08: qty 150

## 2020-08-08 MED ORDER — NALOXONE HCL 0.4 MG/ML IJ SOLN
1.0000 mg | Freq: Once | INTRAMUSCULAR | Status: AC
  Administered 2020-08-08: 1 mg via INTRAVENOUS
  Filled 2020-08-08: qty 3

## 2020-08-08 MED ORDER — SODIUM CHLORIDE 0.9 % IV SOLN: 2000.0000 mg | Freq: Once | INTRAVENOUS | Status: DC

## 2020-08-08 MED ORDER — HYDROCORTISONE NA SUCCINATE PF 100 MG IJ SOLR
100.0000 mg | Freq: Three times a day (TID) | INTRAMUSCULAR | Status: DC
  Administered 2020-08-08 – 2020-08-13 (×14): 100 mg via INTRAVENOUS
  Filled 2020-08-08 (×14): qty 2

## 2020-08-08 MED ORDER — DEXTROSE IN LACTATED RINGERS 5 % IV SOLN: INTRAVENOUS | Status: DC

## 2020-08-08 MED ORDER — VANCOMYCIN HCL IN DEXTROSE 1-5 GM/200ML-% IV SOLN: 1000.0000 mg | Freq: Two times a day (BID) | INTRAVENOUS | Status: DC

## 2020-08-08 MED ORDER — ENOXAPARIN SODIUM 40 MG/0.4ML ~~LOC~~ SOLN
40.0000 mg | SUBCUTANEOUS | Status: DC
  Administered 2020-08-08 – 2020-08-10 (×3): 40 mg via SUBCUTANEOUS
  Filled 2020-08-08 (×3): qty 0.4

## 2020-08-08 MED ORDER — SODIUM CHLORIDE 0.9 % IV BOLUS
2000.0000 mL | Freq: Once | INTRAVENOUS | Status: AC
  Administered 2020-08-08: 2000 mL via INTRAVENOUS

## 2020-08-08 MED ORDER — ACETAMINOPHEN 325 MG PO TABS: 650.0000 mg | ORAL_TABLET | Freq: Four times a day (QID) | ORAL | Status: DC | PRN

## 2020-08-08 MED ORDER — ONDANSETRON HCL 4 MG PO TABS: 4.0000 mg | ORAL_TABLET | Freq: Four times a day (QID) | ORAL | Status: DC | PRN

## 2020-08-08 MED ORDER — LEVETIRACETAM IN NACL 1000 MG/100ML IV SOLN
1000.0000 mg | Freq: Once | INTRAVENOUS | Status: AC
  Administered 2020-08-08: 1000 mg via INTRAVENOUS
  Filled 2020-08-08: qty 100

## 2020-08-08 MED ORDER — LEVETIRACETAM IN NACL 1000 MG/100ML IV SOLN
INTRAVENOUS | Status: AC
  Filled 2020-08-08: qty 100

## 2020-08-08 MED ORDER — ONDANSETRON HCL 4 MG/2ML IJ SOLN: 4.0000 mg | Freq: Four times a day (QID) | INTRAMUSCULAR | Status: DC | PRN

## 2020-08-08 MED ORDER — LACTATED RINGERS IV SOLN: INTRAVENOUS | Status: DC

## 2020-08-08 MED ORDER — LORAZEPAM 2 MG/ML IJ SOLN
2.0000 mg | Freq: Once | INTRAMUSCULAR | Status: AC
  Administered 2020-08-08: 2 mg via INTRAVENOUS
  Filled 2020-08-08: qty 1

## 2020-08-08 MED ORDER — METRONIDAZOLE IN NACL 5-0.79 MG/ML-% IV SOLN
500.0000 mg | Freq: Once | INTRAVENOUS | Status: AC
  Administered 2020-08-08: 500 mg via INTRAVENOUS
  Filled 2020-08-08: qty 100

## 2020-08-08 MED ORDER — DEXAMETHASONE SODIUM PHOSPHATE 10 MG/ML IJ SOLN
10.0000 mg | Freq: Once | INTRAMUSCULAR | Status: AC
  Administered 2020-08-08: 10 mg via INTRAVENOUS
  Filled 2020-08-08: qty 1

## 2020-08-08 MED ORDER — ACETAMINOPHEN 650 MG RE SUPP: 650.0000 mg | Freq: Four times a day (QID) | RECTAL | Status: DC | PRN

## 2020-08-08 MED ORDER — VANCOMYCIN HCL IN DEXTROSE 1-5 GM/200ML-% IV SOLN
1000.0000 mg | Freq: Once | INTRAVENOUS | Status: AC
  Administered 2020-08-08: 1000 mg via INTRAVENOUS
  Filled 2020-08-08: qty 200

## 2020-08-08 MED ORDER — LORAZEPAM 2 MG/ML IJ SOLN
2.0000 mg | INTRAMUSCULAR | Status: DC | PRN
  Administered 2020-08-12: 2 mg via INTRAVENOUS
  Filled 2020-08-08: qty 1

## 2020-08-08 NOTE — ED Notes (Signed)
2nd blood culture has not been collected , per EDP and protocol start antiobiotics

## 2020-08-08 NOTE — ED Notes (Signed)
Pt will open eyes spontaneously, but does not make any eye contact. Pt does not open eyes to verbal stimuli

## 2020-08-08 NOTE — Sepsis Progress Note (Signed)
Notified bedside nurse of need to draw lactic acid and Blood cultures.  ?

## 2020-08-08 NOTE — ED Notes (Signed)
First set of blood cultures obtained by nurse and sent to lab

## 2020-08-08 NOTE — H&P (Signed)
History and Physical    Cameron Hale TKZ:[id number] DOB: [birth year] DOA: 08/08/2020  PCP: Alroy Dust, L.Marlou Sa, MD   Patient coming from: Home   Chief Complaint: altered mental status.   HPI: Cameron Hale is a 44 y.o. male with medical history significant of type 2 neurofibromatosis and seizures.   Patient was noted to have a generalized seizure yesterday morning, consistent in blank staring and being unresponsive.  Through the course of the day he returned to his baseline.  Her mother/caregiver called his neurologist who recommend giving extra 500 mg of Keppra that he received at 5 PM yesterday.  He did took his usual dose of 1500 mg twice daily, his last dose was yesterday evening. This morning patient very somnolent, lethargic, and unresponsive.  His family took his blood pressure and it was 60 mmHg systolic.  Her mother called again his neurologist and the recommendation was to call EMS and transport patient to [facility]. On route he had persistent hypotension and he was brought to [facility] emergency department.  Patient has been recently diagnosed with neurofibromatosis type II, he underwent neurosurgery intervention in March 2021, post surgery he developed functional quadriplegia, blindness and hearing loss, the only full neurologic function preserved is talking.  He spent about 5 months in the hospital.  During his hospitalization he was diagnosed with seizures, and had episode of status epilepticus. He was eventually in August this year discharged home under the care of his parents.  He needs assistance with this basic activities of daily living including bathing and eating.  He does have a decubitus pressure ulcer stage II.  He was diagnosed with urinary tract infection about 7 days ago and he had ciprofloxacin prescribed.  All information is obtained from his mother at the bedside.  Patient unable to give any detailed history due to cognitive impairment.  ED Course:  Patient unresponsive, his CT head with advanced meningiomatosis, unchanged vasogenic edema.  No acute bleeding.  Arterial blood gas analysis with normal pH.  Chest radiograph with signs of aspiration pneumonia.  [facility] was contacted, unfortunately unable to perform ED to ED transfer. Patient received intravenous fluids, dexamethasone, 2 g of Keppra, 2 mg IV lorazepam, aztreonam, vancomycin, cefepime and metronidazole. Referred for admission for further evaluation.   Review of Systems: unable to obtain due to patient's poor cognition all information per his mother at the beside    Past Medical History:  Diagnosis Date  . NF2 (neurofibromatosis 2) ([place])   . Seizures ([place])     Past Surgical History:  Procedure Laterality Date  . brain bleed     mri 2009  . HAND EXPLORATION    . LUMBAR FUSION    . NEPHRECTOMY     Partial left nephrectomy   . SPINAL FUSION       reports that he has never smoked. He has never used smokeless tobacco. He reports previous alcohol use. He reports that he does not use drugs.  Allergies  Allergen Reactions  . Amoxicillin Itching  . Morphine Itching    No family history on file.   Prior to Admission medications   Medication Sig Start Date End Date Taking? Authorizing Provider  acetaminophen (TYLENOL) 325 MG tablet Take 2 tablets (650 mg total) by mouth every 6 (six) hours as needed for mild pain or fever. 06/05/20  Yes Angiulli, Lavon Paganini, PA-C  baclofen (LIORESAL) 10 MG tablet Take 1 tablet (10 mg total) by mouth 2 (two) times daily. 06/14/20  Yes Lovorn, Megan, MD  brigatinib (ALUNBRIG) 90 & 180 MG TBPK Take 90-180 mg by mouth daily.  06/07/20  Yes [provider]  carboxymethylcellulose (REFRESH TEARS) 0.5 % SOLN Place 1 drop into both eyes daily as needed (For dry eyes).   Yes [provider]  ciprofloxacin (CIPRO) 500 MG tablet Take 500 mg by mouth 2 (two) times daily.  08/01/20  Yes [provider]  dexamethasone  (DECADRON) 2 MG tablet Take 2 mg by mouth daily.  06/21/20  Yes [provider]  famotidine (PEPCID) 20 MG tablet Take 1 tablet (20 mg total) by mouth 2 (two) times daily. 07/21/20  Yes Lovorn, Jinny Blossom, MD  levETIRAcetam (KEPPRA) 500 MG tablet Take 3 tablets (1,500 mg total) by mouth 2 (two) times daily. 06/05/20 08/08/20 Yes Angiulli, Lavon Paganini, PA-C  propranolol (INDERAL) 10 MG tablet Take 1 tablet (10 mg total) by mouth 2 (two) times daily. 06/05/20  Yes Angiulli, Lavon Paganini, PA-C  ammonium lactate (AMLACTIN) 12 % lotion Apply 1 application topically as needed for dry skin. Patient not taking: Reported on 08/08/2020 06/28/20   Felipa Furnace, DPM  bisacodyl (DULCOLAX) 10 MG suppository Place 1 suppository (10 mg total) rectally daily at 6 PM. Patient not taking: Reported on 08/08/2020 06/05/20   Cathlyn Parsons, PA-C    Physical Exam: Vitals:   08/08/20 1445 08/08/20 1500 08/08/20 1515 08/08/20 1530  BP:  106/66  113/71  Pulse: (!) 58 (!) 54 (!) 54   Resp: 19 18 18 19   Temp: (!) 89.4 F (31.9 C) (!) 89.2 F (31.8 C) (!) 89.2 F (31.8 C) (!) 89.1 F (31.7 C)  TempSrc:      SpO2: 99% 99% 99%   Weight:        Vitals:   08/08/20 1445 08/08/20 1500 08/08/20 1515 08/08/20 1530  BP:  106/66  113/71  Pulse: (!) 58 (!) 54 (!) 54   Resp: 19 18 18 19   Temp: (!) 89.4 F (31.9 C) (!) 89.2 F (31.8 C) (!) 89.2 F (31.8 C) (!) 89.1 F (31.7 C)  TempSrc:      SpO2: 99% 99% 99%   Weight:       General: deconditioned and ill looking appearing  Neurology: only mild grimacing to pain. Spastic upper and lower extremities, pupils reactive to light.  Head and Neck. Head normocephalic. Neck supple with no adenopathy or thyromegaly.   E ENT: mild pallor, no icterus, oral mucosa dry Cardiovascular: No JVD. S1-S2 present, rhythmic, no gallops, rubs, or murmurs. No lower extremity edema. Pulmonary: positive breath sounds bilaterally, no wheezing, rhonchi or rales. Anterior auscultation.    Gastrointestinal. Abdomen soft and non tender. Skin. Positive linear pressure ulcer stage 2-3, abut 3 cm long, midline lower lumbar region, no erythema or purulence.  Musculoskeletal: no joint deformities    Labs on Admission: I have personally reviewed following labs and imaging studies  CBC: Recent Labs  Lab 08/08/20 1247  WBC 10.3  NEUTROABS 8.8*  HGB 9.5*  HCT 30.3*  MCV 85.6  PLT 182*   Basic Metabolic Panel: Recent Labs  Lab 08/08/20 1247  NA 141  K 3.7  CL 111  CO2 25  GLUCOSE 62*  BUN 23*  CREATININE <0.30*  CALCIUM 7.7*   GFR: CrCl cannot be calculated (This lab value cannot be used to calculate CrCl because it is not a number: <0.30). Liver Function Tests: Recent Labs  Lab 08/08/20 1247  AST 163*  ALT 233*  ALKPHOS 102  BILITOT 0.3  PROT 4.4*  ALBUMIN 1.7*   No results for input(s): LIPASE, AMYLASE in the last 168 hours. No results for input(s): AMMONIA in the last 168 hours. Coagulation Profile: Recent Labs  Lab 08/08/20 1403  INR 1.0   Cardiac Enzymes: Recent Labs  Lab 08/08/20 1320  CKTOTAL 30*   BNP (last 3 results) No results for input(s): PROBNP in the last 8760 hours. HbA1C: No results for input(s): HGBA1C in the last 72 hours. CBG: Recent Labs  Lab 08/08/20 1544  GLUCAP 122*   Lipid Profile: No results for input(s): CHOL, HDL, LDLCALC, TRIG, CHOLHDL, LDLDIRECT in the last 72 hours. Thyroid Function Tests: No results for input(s): TSH, T4TOTAL, FREET4, T3FREE, THYROIDAB in the last 72 hours. Anemia Panel: No results for input(s): VITAMINB12, FOLATE, FERRITIN, TIBC, IRON, RETICCTPCT in the last 72 hours. Urine analysis:    Component Value Date/Time   COLORURINE YELLOW 08/08/2020 [place] 08/08/2020 1208   LABSPEC 1.016 08/08/2020 1208   PHURINE 5.0 08/08/2020 1208   GLUCOSEU NEGATIVE 08/08/2020 1208   HGBUR NEGATIVE 08/08/2020 1208   BILIRUBINUR NEGATIVE 08/08/2020 [place]  08/08/2020 1208   PROTEINUR NEGATIVE 08/08/2020 1208   NITRITE NEGATIVE 08/08/2020 1208   LEUKOCYTESUR TRACE (A) 08/08/2020 1208    Radiological Exams on Admission: CT Head Wo Contrast  Result Date: 08/08/2020 CLINICAL DATA:  Cerebral hemorrhage suspected. Additional history provided: History of NF 2, history of seizures, last seizure yesterday, decreased full of consciousness, low blood pressure. EXAM: CT HEAD WITHOUT CONTRAST TECHNIQUE: Contiguous axial images were obtained from the base of the skull through the vertex without intravenous contrast. COMPARISON:  Prior brain MRI 05/24/2020.  Prior head CT 09/10/2017. FINDINGS: Brain: Known extensive meningiomatosis was more fully characterized on the brain MRI of 05/24/2020. Most notably, there is a dominant parafalcine meningioma as well as extensive en plaque meningiomas greatest overlying the anterior and superior cerebral convexities. Numerous additional smaller supratentorial and infratentorial meningiomas were better appreciated on the prior MRI. A right vestibular schwannoma was also better appreciated on this prior study. There is limited assessment for interval change in size of these masses due to noncontrast technique on the current study and differences in modality. Prominent vasogenic edema within the bilateral mid to anterior frontal lobes and crossing the corpus callosum does not appear significantly changed. Redemonstrated small nonspecific scattered foci of parenchymal calcification. Questionable asymmetric enlargement of the right hippocampus was better appreciated on the prior MRI. As before, there is partial herniation of the posterior rectus gyri into the sella turcica. There is no acute intracranial hemorrhage. No demarcated cortical infarct is identified. No extra-axial fluid collection. No midline shift. Vascular: No hyperdense vessel.  Atherosclerotic calcifications. Skull: Sequela of prior bifrontal parietal  craniotomy/cranioplasty. Sinuses/Orbits: Redemonstrated small foci of calcification along the right greater than left intraorbital optic nerve sheaths suspicious for optic nerve sheath meningiomas. Mild right frontal sinus mucosal thickening. 16 mm osteoma within the left frontoethmoidal recess. No significant mastoid effusion. IMPRESSION: No evidence of acute intracranial hemorrhage or acute demarcated cortical infarct. Extensive meningiomatosis more fully characterized on the recent prior brain MRI of 05/24/2020. Most notably, this includes a large parafalcine meningioma as well as extensive en plaque meningiomas greatest along the anterior and superior cerebral convexities. Unchanged prominent vasogenic edema within the underlying mid-to-anterior frontal lobes and crossing the corpus callosum. As before, there is herniation of the rectus gyri into the sella turcica. 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 A known right vestibular schwannoma was better appreciated on the prior MRI. Questionable asymmetric enlargement of the right hippocampus was also better appreciated on the prior MRI. Electronically Signed   By: Kellie Simmering DO   On: 08/08/2020 14:07   DG Chest Port 1 View  Result Date: 08/08/2020 CLINICAL DATA:  Questionable sepsis.  COVID test pending. EXAM: PORTABLE CHEST 1 VIEW COMPARISON:  None. FINDINGS: Multifocal airspace opacities in the left lung. No pleural effusions. No discernible pneumothorax. Spinal stabilization hardware, partially imaged. Polyarticular degenerative change without evidence of acute fracture. IMPRESSION: Multifocal airspace opacities in the left lung, concerning for pneumonia. Recommend follow-up to resolution. Electronically Signed   By: Margaretha Sheffield MD   On: 08/08/2020 13:12    EKG: Independently reviewed.  59 bpm, normal axis, normal  intervals, sinus rhythm, no ST segment or T wave changes.  Assessment/Plan Principal Problem:   Seizures ([place]) Active Problems:   Paraparesis ([facility])   Mixed conductive and sensorineural hearing loss of right ear with unrestricted hearing of left ear   Quadriplegia ([place])   AMS (altered mental status)   Aspiration pneumonia ([place])   Neurofibromatosis 2 ([place])    44 year old male with functional quadriparesis, hearing and visual loss with history of neurofibromatosis type II and seizures with prior event of status epilepticus who presents with altered mentation.  24 hours ago he was noted to have a generalized seizure that recovered by itself.  His dose of Keppra was increased by 500 mg, receiving a total of 3500 mg through the day.  This morning patient very somnolent and unresponsive.  He has a very poor functional neurologic baseline due to advanced neurofibromatosis type II (he can only talk). For the last 7 days he has been taking ciprofloxacin.  On his initial physical examination his temperature was 32.2 C, blood pressure 94/62, heart rate 57, respiratory rate 19, oxygen saturation 98%.  Mild pallor, dry mucous membranes, lungs clear to auscultation, heart S1-S2, present rhythmic, soft abdomen, no lower extremity edema, positive stage II-III decubitus ulcer. Sodium 141, potassium 3.7, chloride 111, bicarb 25, glucose 62, BUN 23, creatinine 0.3, AST 163, ALT 133, CK 30, lactic acid 0.8, white count 10.3, hemoglobin 9.5, hematocrit 30.3, platelets 113.  SARS COVID-19 negative, urinalysis 6-10 white cells, specific gravity 1.016.  Tox screen negative.  Chest radiograph with a faint infiltrate left upper lobe, right base atelectasis. Arterial blood gas pH 7.36, PCO2 40.9, PaO2 85.9, bicarb 23, oxygen saturation 97.4%  Patient will be admitted to the progressive care unit with a working diagnosis of metabolic/toxic encephalopathy, complicated by left upper lobe aspiration pneumonia.  Rule out  uncontrolled seizures.  1.  Acute metabolic/toxic encephalopathy/ seizures.  Patient will be admitted to the progressive care unit at [facility], for close neurologic monitoring.  Patient will need a electroencephalography and further neurology evaluation.  Certainly his somnolence and lethargy can be related to a high dose of Keppra or postictal state. His pH is normal and currently he is protecting his airway.  His lactic acid is not elevated. Patient is high risk due to prior history of status epilepticus.  Patient has been taking fluoroquinolones that can decrease seizure threshold.  Add balanced electrolyte solutions with dextrose IV for hydration. As needed lorazepam for seizures.   2.  Left upper lobe aspiration pneumonia.  Continue aspiration precautions, keep patient nothing by mouth, antibiotic therapy with cefepime,  patient allergic to amoxicillin.  Hold on vancomycin for now. Follow-up on cultures, cell count and temperature curve.  3.  Neurofibromatosis type II, extensive disease, positive vasogenic edema.  Patient has been taking brigatinib as part of clinical trial.  4.  Hypotension.  Likely multifactorial, continue volume resuscitation with balance electrolyte solutions, patient has been on dexamethasone, will switch to stress dose steroids. Hold on propanolol.  Patient critically ill with significant encephalopathy, critical care time 60 minutes.  Status is: Inpatient  Remains inpatient appropriate because:IV treatments appropriate due to intensity of illness or inability to take PO   Dispo: The patient is from: Home              Anticipated d/c is to: Home              Anticipated d/c date is: > 3 days              Patient currently is not medically stable to d/c.   DVT prophylaxis: Enoxaparin   Code Status:   full  Family Communication:  I spoke with patient's mother at the bedside, we talked in detail about patient's condition, plan of care and prognosis and all  questions were addressed.     Consults called:  Neurology   Admission status:  Inpatient    Nikitia Asbill Gerome Apley MD [facility]   08/08/2020, 4:19 PM

## 2020-08-08 NOTE — ED Notes (Signed)
Redness noted to scrotum and wound to buttock.  Mother in room and aware, report scrotum has been red for last 2 days, she changed soap but do not see any change. Mother reports that pt is being treated to sacral wound by wound Nurse in [facility].

## 2020-08-08 NOTE — Progress Notes (Signed)
Pharmacy Antibiotic Note  Cameron Hale is a 44 y.o. male admitted on 08/08/2020 with unknown source of infection.  Pharmacy has been consulted for Vancomycin and Cefepime dosing.  Plan: Vancomycin 1750 mg IV x 1 dose. Vancomycin 1000 mg IV every 12 hours. Expected AUC 507. Cefepime 2000 mg IV every 8 hours. Monitor labs, c/s, and vanco level as indicated.  Weight: 65.3 kg (144 lb)  Temp (24hrs), Avg:90.7 F (32.6 C), Min:89.6 F (32 C), Max:93 F (33.9 C)  Recent Labs  Lab 08/08/20 1247 08/08/20 1403  WBC 10.3  --   CREATININE <0.30*  --   LATICACIDVEN 0.8 0.6    CrCl cannot be calculated (This lab value cannot be used to calculate CrCl because it is not a number: <0.30).    Allergies  Allergen Reactions  . Amoxicillin Itching  . Morphine Itching    Antimicrobials this admission: Vanco 9/21 >>  Cefepime 9/21 >>  Flagyl 9/21   Microbiology results: 9/21 BCx: pending 9/21 UCx: pending    Thank you for allowing pharmacy to be a part of this patient's care.  Ramond Craver 08/08/2020 3:07 PM

## 2020-08-08 NOTE — ED Provider Notes (Signed)
[facility] EMERGENCY DEPARTMENT Provider Note   CSN: [id number] Arrival date & time: 08/08/20  1132     History Chief Complaint  Patient presents with  . Hypotension    Cameron Hale is a 44 y.o. male.  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  He has been taken care of by a Dr. Clovis Riley at [facility].  The patient has had altered mental status since yesterday.  There was some question about seizure because his mother states he acts like this after seizures.  The history is provided by a relative. No language interpreter was used.  Altered Mental Status Presenting symptoms: behavior changes   Severity:  Severe Most recent episode:  Yesterday Episode history:  Continuous Timing:  Constant Progression:  Worsening Chronicity:  Recurrent Context: not alcohol use        Past Medical History:  Diagnosis Date  . NF2 (neurofibromatosis 2) ([place])   . Seizures [facility])     Patient Active Problem List   Diagnosis Date Noted  . Acute blood loss anemia   . Hypokalemia   . Hyponatremia   . Seizures ([place])   . Neurogenic bowel   . Incomplete paraplegia ([place]) 05/09/2020  . Quadriplegia ([place]) 05/09/2020  . Sensorineural hearing loss (SNHL) of right ear with unrestricted hearing of left ear 06/03/2019  . Tinnitus of right ear 06/03/2019  . Mixed conductive and sensorineural hearing loss of right ear with unrestricted hearing of left ear 05/04/2019  . Keratoma 05/01/2016  . Difficulty walking 05/01/2016  . Neuromyopathy ([place]) 05/01/2016  . 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 Acoustic neuroma ([place]) 03/14/2015  . 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 H/O arthrodesis 02/17/2014  . Paraparesis ([place]) 02/17/2014  . Kidney lump 07/03/2012  . Neurofibroma of multiple sites [facility]) 09/23/2011    Past Surgical History:  Procedure Laterality Date  . brain bleed     mri 2009  . HAND EXPLORATION    .  LUMBAR FUSION    . NEPHRECTOMY     Partial left nephrectomy   . SPINAL FUSION         No family history on file.  Social History   Tobacco Use  . Smoking status: Never Smoker  . Smokeless tobacco: Never Used  Vaping Use  . Vaping Use: Never used  Substance Use Topics  . Alcohol use: Not Currently    Alcohol/week: 0.0 standard drinks  . Drug use: Never    Home Medications Prior to Admission medications   Medication Sig Start Date End Date Taking? Authorizing Provider  acetaminophen (TYLENOL) 325 MG tablet Take 2 tablets (650 mg total) by mouth every 6 (six) hours as needed for mild pain or fever. 06/05/20  Yes Angiulli, Lavon Paganini, PA-C  baclofen (LIORESAL) 10 MG tablet Take 1 tablet (10 mg total) by mouth 2 (two) times daily. 06/14/20  Yes Lovorn, Megan, MD  brigatinib (ALUNBRIG) 90 & 180 MG TBPK Take 90-180 mg by mouth daily.  06/07/20  Yes [provider]  carboxymethylcellulose (REFRESH TEARS) 0.5 % SOLN Place 1 drop into both eyes daily as needed (For dry eyes).   Yes [provider]  ciprofloxacin (CIPRO) 500 MG tablet Take 500 mg by mouth 2 (two) times daily.  08/01/20  Yes [provider]  dexamethasone (DECADRON) 2 MG tablet Take 2 mg by mouth daily.  06/21/20  Yes [provider]  famotidine (PEPCID) 20  MG tablet Take 1 tablet (20 mg total) by mouth 2 (two) times daily. 07/21/20  Yes Lovorn, Jinny Blossom, MD  levETIRAcetam (KEPPRA) 500 MG tablet Take 3 tablets (1,500 mg total) by mouth 2 (two) times daily. 06/05/20 08/08/20 Yes Angiulli, Lavon Paganini, PA-C  propranolol (INDERAL) 10 MG tablet Take 1 tablet (10 mg total) by mouth 2 (two) times daily. 06/05/20  Yes Angiulli, Lavon Paganini, PA-C  ammonium lactate (AMLACTIN) 12 % lotion Apply 1 application topically as needed for dry skin. Patient not taking: Reported on 08/08/2020 06/28/20   Felipa Furnace, DPM  bisacodyl (DULCOLAX) 10 MG suppository Place 1 suppository (10 mg total) rectally daily at 6 PM. Patient not  taking: Reported on 08/08/2020 06/05/20   Angiulli, Lavon Paganini, PA-C    Allergies    Amoxicillin and Morphine  Review of Systems   Review of Systems  Unable to perform ROS: Mental status change    Physical Exam Updated Vital Signs BP 106/66   Pulse (!) 54   Temp (!) 89.2 F (31.8 C)   Resp 18   Wt 65.3 kg   SpO2 99%   BMI (P) 20.66 kg/m   Physical Exam Vitals and nursing note reviewed.  Constitutional:      Appearance: He is well-developed.     Comments: None alert  HENT:     Head: Normocephalic.     Nose: Nose normal.     Mouth/Throat:     Comments: Dry mucous membrane Eyes:     General: No scleral icterus.    Conjunctiva/sclera: Conjunctivae normal.  Neck:     Thyroid: No thyromegaly.  Cardiovascular:     Rate and Rhythm: Normal rate and regular rhythm.     Heart sounds: No murmur heard.  No friction rub. No gallop.   Pulmonary:     Breath sounds: No stridor. No wheezing or rales.  Chest:     Chest wall: No tenderness.  Abdominal:     General: There is no distension.     Tenderness: There is no abdominal tenderness. There is no rebound.  Musculoskeletal:        General: No swelling.     Cervical back: Neck supple.  Lymphadenopathy:     Cervical: No cervical adenopathy.  Skin:    Findings: No erythema or rash.  Neurological:     Motor: No abnormal muscle tone.     Coordination: Coordination normal.     Comments: Patient has pinpoint pupils he will not respond to verbal or painful stimuli he is protecting his airway and just staring     ED Results / Procedures / Treatments   Labs (all labs ordered are listed, but only abnormal results are displayed) Labs Reviewed  COMPREHENSIVE METABOLIC PANEL - Abnormal; Notable for the following components:      Result Value   Glucose, Bld 62 (*)    BUN 23 (*)    Creatinine, Ser <0.30 (*)    Calcium 7.7 (*)    Total Protein 4.4 (*)    Albumin 1.7 (*)    AST 163 (*)    ALT 233 (*)    All other components  within normal limits  CBC WITH DIFFERENTIAL/PLATELET - Abnormal; Notable for the following components:   RBC 3.54 (*)    Hemoglobin 9.5 (*)    HCT 30.3 (*)    RDW 17.5 (*)    Platelets 113 (*)    Neutro Abs 8.8 (*)    Abs Immature Granulocytes 0.29 (*)  All other components within normal limits  URINALYSIS, ROUTINE W REFLEX MICROSCOPIC - Abnormal; Notable for the following components:   Leukocytes,Ua TRACE (*)    Bacteria, UA RARE (*)    All other components within normal limits  CK - Abnormal; Notable for the following components:   Total CK 30 (*)    All other components within normal limits  APTT - Abnormal; Notable for the following components:   aPTT 39 (*)    All other components within normal limits  CULTURE, BLOOD (ROUTINE X 2)  CULTURE, BLOOD (ROUTINE X 2)  URINE CULTURE  SARS CORONAVIRUS 2 BY RT PCR (HOSPITAL ORDER, [place] [facility])  LACTIC ACID, PLASMA  LACTIC ACID, PLASMA  PROTIME-INR  RAPID URINE DRUG SCREEN, HOSP PERFORMED  BLOOD GAS, ARTERIAL    EKG None  Radiology CT Head Wo Contrast  Result Date: 08/08/2020 CLINICAL DATA:  Cerebral hemorrhage suspected. Additional history provided: History of NF 2, history of seizures, last seizure yesterday, decreased full of consciousness, low blood pressure. EXAM: CT HEAD WITHOUT CONTRAST TECHNIQUE: Contiguous axial images were obtained from the base of the skull through the vertex without intravenous contrast. COMPARISON:  Prior brain MRI 05/24/2020.  Prior head CT 09/10/2017. FINDINGS: Brain: Known extensive meningiomatosis was more fully characterized on the brain MRI of 05/24/2020. Most notably, there is a dominant parafalcine meningioma as well as extensive en plaque meningiomas greatest overlying the anterior and superior cerebral convexities. Numerous additional smaller supratentorial and infratentorial meningiomas were better appreciated on the prior MRI. A right vestibular schwannoma was also  better appreciated on this prior study. There is limited assessment for interval change in size of these masses due to noncontrast technique on the current study and differences in modality. Prominent vasogenic edema within the bilateral mid to anterior frontal lobes and crossing the corpus callosum does not appear significantly changed. Redemonstrated small nonspecific scattered foci of parenchymal calcification. Questionable asymmetric enlargement of the right hippocampus was better appreciated on the prior MRI. As before, there is partial herniation of the posterior rectus gyri into the sella turcica. There is no acute intracranial hemorrhage. No demarcated cortical infarct is identified. No extra-axial fluid collection. No midline shift. Vascular: No hyperdense vessel.  Atherosclerotic calcifications. Skull: Sequela of prior bifrontal parietal craniotomy/cranioplasty. Sinuses/Orbits: Redemonstrated small foci of calcification along the right greater than left intraorbital optic nerve sheaths suspicious for optic nerve sheath meningiomas. Mild right frontal sinus mucosal thickening. 16 mm osteoma within the left frontoethmoidal recess. No significant mastoid effusion. IMPRESSION: No evidence of acute intracranial hemorrhage or acute demarcated cortical infarct. Extensive meningiomatosis more fully characterized on the recent prior brain MRI of 05/24/2020. Most notably, this includes a large parafalcine meningioma as well as extensive en plaque meningiomas greatest along the anterior and superior cerebral convexities. Unchanged prominent vasogenic edema within the underlying mid-to-anterior frontal lobes and crossing the corpus callosum. As before, there is herniation of the rectus gyri into the sella turcica. 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 A known right vestibular schwannoma was  better appreciated on the prior MRI. Questionable asymmetric enlargement of the right hippocampus was also better appreciated on the prior MRI. Electronically Signed   By: Kellie Simmering DO   On: 08/08/2020 14:07   DG Chest Port 1 View  Result Date: 08/08/2020 CLINICAL DATA:  Questionable sepsis.  COVID test pending. EXAM: PORTABLE CHEST 1 VIEW COMPARISON:  None. FINDINGS: Multifocal airspace opacities in the left lung. No pleural effusions. No discernible pneumothorax. Spinal stabilization hardware, partially imaged. Polyarticular degenerative change without evidence of acute fracture. IMPRESSION: Multifocal airspace opacities in the left lung, concerning for pneumonia. Recommend follow-up to resolution. Electronically Signed   By: Margaretha Sheffield MD   On: 08/08/2020 13:12    Procedures Procedures (including critical care time)  Medications Ordered in ED Medications  lactated ringers infusion ( Intravenous New Bag/Given 08/08/20 1453)  ceFEPIme (MAXIPIME) 2 g in sodium chloride 0.9 % 100 mL IVPB (has no administration in time range)  vancomycin (VANCOREADY) IVPB 750 mg/150 mL (750 mg Intravenous New Bag/Given 08/08/20 1532)  vancomycin (VANCOCIN) IVPB 1000 mg/200 mL premix (has no administration in time range)  sodium chloride 0.9 % bolus 2,000 mL (0 mLs Intravenous Stopped 08/08/20 1344)  aztreonam (AZACTAM) 2 g in sodium chloride 0.9 % 100 mL IVPB (0 g Intravenous Stopped 08/08/20 1344)  metroNIDAZOLE (FLAGYL) IVPB 500 mg (0 mg Intravenous Stopped 08/08/20 1452)  vancomycin (VANCOCIN) IVPB 1000 mg/200 mL premix (0 mg Intravenous Stopped 08/08/20 1452)  LORazepam (ATIVAN) injection 2 mg (2 mg Intravenous Given 08/08/20 1218)  levETIRAcetam (KEPPRA) IVPB 1000 mg/100 mL premix (0 mg Intravenous Stopped 08/08/20 1451)    Followed by  levETIRAcetam (KEPPRA) IVPB 1000 mg/100 mL premix (0 mg Intravenous Stopped 08/08/20 1345)  dexamethasone (DECADRON) injection 10 mg (10 mg Intravenous Given 08/08/20 1429)   naloxone (NARCAN) injection 1 mg (1 mg Intravenous Given 08/08/20 1425)  dextrose 50 % solution 25 mL (25 mLs Intravenous Given 08/08/20 1427)    ED Course  I have reviewed the triage vital signs and the nursing notes.  Pertinent labs & imaging results that were available during my care of the patient were reviewed by me and considered in my medical decision making (see chart for details).     Patient with continued altered mental status.  Possible pneumonia on chest x-ray.  Sepsis protocol was done patient was given antibiotics along with Keppra and Ativan.  Patient continued to not respond verbally or follow commands.  He is protecting his airway.  Patient is hypothermic and initially was hypotensive but this corrected with fluids.  I spoke with the patient's Dr.  Dr. Clovis Riley and he felt like the patient needed to be transferred over to [facility].  He did not feel like the patient needed to have an LP done.  He did have me talk to the neurologist over at [facility] who agrees the patient should come there and get continuous EEG and also be worked up for sepsis.  They do not have any beds at [facility] and they are trying to arrange for an ED to ED transfer.  They will also let us know if the patient can be transferred there.  In the meantime I spoke with the hospitalist at any [facility] and he knows everything about the patient and will admit the patient if [facility] will not    [facility] called back and stated there was no beds available so the hospitalist will admit here     CRITICAL CARE Performed by: Milton Ferguson Total critical care time:60 minutes Critical care time was exclusive of separately billable procedures and treating other patients. Critical care was necessary to treat or prevent imminent or life-threatening deterioration. Critical care was time spent personally by me on the following activities: development of treatment plan with patient and/or surrogate as well as  nursing, discussions with consultants,  evaluation of patient's response to treatment, examination of patient, obtaining history from patient or surrogate, ordering and performing treatments and interventions, ordering and review of laboratory studies, ordering and review of radiographic studies, pulse oximetry and re-evaluation of patient's condition.  MDM Rules/Calculators/A&P                         Patient with hypothermia sepsis he will need medicine    This patient presents to the ED for concern of altered mental status, this involves an extensive number of treatment options, and is a complaint that carries with it a high risk of complications and morbidity.  The differential diagnosis includes sepsis seizures   Lab Tests:   I Ordered, reviewed, and interpreted labs, which included CBC and chemistries that showed mild anemia hyperglycemia elevated liver enzymes  Medicines ordered:   I ordered medication antibiotics and fluids for sepsis  Imaging Studies ordered:   I ordered imaging studies which included chest x-ray and CT head  I independently visualized and interpreted imaging which showed pneumonia and a CT head did not show any acute changes but he has extensive meningiomatosis  Additional history obtained:   Additional history obtained from mother  Previous records obtained and reviewed.  Consultations Obtained:   I consulted neurologist and hospitalist and discussed lab and imaging findings  Reevaluation:  After the interventions stated above, I reevaluated the patient and found unchanged  Critical Interventions:  .    Final Clinical Impression(s) / ED Diagnoses Final diagnoses:  None    Rx / DC Orders ED Discharge Orders    None       Milton Ferguson, MD 08/10/20 1100

## 2020-08-08 NOTE — ED Triage Notes (Signed)
EMS reports pt has history of NF2.  Reports has history of seizures.  Reports last seizure was yesterday.  Reports pt woke up feeling "fine" then started have decreased LOC.  BP 60/40 manually and 74/52 while fluid bolus infusing.  EMS says pt usually alert and oriented.

## 2020-08-09 ENCOUNTER — Inpatient Hospital Stay (HOSPITAL_COMMUNITY): Payer: Medicare Other

## 2020-08-09 ENCOUNTER — Inpatient Hospital Stay (HOSPITAL_COMMUNITY)
Admit: 2020-08-09 | Discharge: 2020-08-09 | Disposition: A | Discharge status: Home / Self Care | Payer: Medicare Other | Attending: Internal Medicine | Admitting: Internal Medicine

## 2020-08-09 ENCOUNTER — Encounter
Payer: Medicare Other | Attending: Physical Medicine and Rehabilitation | Admitting: Physical Medicine and Rehabilitation

## 2020-08-09 DIAGNOSIS — R9401 Abnormal electroencephalogram [EEG]: Secondary | ICD-10-CM | POA: Diagnosis not present

## 2020-08-09 DIAGNOSIS — G709 Myoneural disorder, unspecified: Secondary | ICD-10-CM | POA: Insufficient documentation

## 2020-08-09 DIAGNOSIS — R569 Unspecified convulsions: Secondary | ICD-10-CM | POA: Diagnosis not present

## 2020-08-09 DIAGNOSIS — C720 Malignant neoplasm of spinal cord: Secondary | ICD-10-CM | POA: Insufficient documentation

## 2020-08-09 DIAGNOSIS — Q8500 Neurofibromatosis, unspecified: Secondary | ICD-10-CM | POA: Insufficient documentation

## 2020-08-09 DIAGNOSIS — J9601 Acute respiratory failure with hypoxia: Secondary | ICD-10-CM

## 2020-08-09 DIAGNOSIS — R7401 Elevation of levels of liver transaminase levels: Secondary | ICD-10-CM

## 2020-08-09 DIAGNOSIS — G8250 Quadriplegia, unspecified: Secondary | ICD-10-CM | POA: Insufficient documentation

## 2020-08-09 LAB — BASIC METABOLIC PANEL
Anion gap: 7 (ref 5–15)
BUN: 17 mg/dL (ref 6–20)
CO2: 24 mmol/L (ref 22–32)
Calcium: 8.4 mg/dL — ABNORMAL LOW (ref 8.9–10.3)
Chloride: 111 mmol/L (ref 98–111)
Creatinine, Ser: <0.3 mg/dL — ABNORMAL LOW (ref 0.61–1.24)
Glucose, Bld: 108 mg/dL — ABNORMAL HIGH (ref 70–99)
Potassium: 3.5 mmol/L (ref 3.5–5.1)
Sodium: 142 mmol/L (ref 135–145)

## 2020-08-09 LAB — CBC
HCT: 31.4 % — ABNORMAL LOW (ref 39.0–52.0)
Hemoglobin: 9.9 g/dL — ABNORMAL LOW (ref 13.0–17.0)
MCH: 26.3 pg (ref 26.0–34.0)
MCHC: 31.5 g/dL (ref 30.0–36.0)
MCV: 83.3 fL (ref 80.0–100.0)
Platelets: 144 10*3/uL — ABNORMAL LOW (ref 150–400)
RBC: 3.77 MIL/uL — ABNORMAL LOW (ref 4.22–5.81)
RDW: 17.4 % — ABNORMAL HIGH (ref 11.5–15.5)
WBC: 11 10*3/uL — ABNORMAL HIGH (ref 4.0–10.5)
nRBC: 0 % (ref 0.0–0.2)

## 2020-08-09 LAB — URINE CULTURE: Culture: NO GROWTH

## 2020-08-09 LAB — HIV ANTIBODY (ROUTINE TESTING W REFLEX): HIV Screen 4th Generation wRfx: NONREACTIVE

## 2020-08-09 MED ORDER — METRONIDAZOLE IN NACL 5-0.79 MG/ML-% IV SOLN
500.0000 mg | Freq: Three times a day (TID) | INTRAVENOUS | Status: DC
  Administered 2020-08-09 – 2020-08-13 (×11): 500 mg via INTRAVENOUS
  Filled 2020-08-09 (×12): qty 100

## 2020-08-09 MED ORDER — CHLORHEXIDINE GLUCONATE CLOTH 2 % EX PADS
6.0000 | MEDICATED_PAD | Freq: Every day | CUTANEOUS | Status: DC
  Administered 2020-08-10 – 2020-09-06 (×28): 6 via TOPICAL

## 2020-08-09 MED ORDER — LEVETIRACETAM IN NACL 1500 MG/100ML IV SOLN
1500.0000 mg | Freq: Two times a day (BID) | INTRAVENOUS | Status: DC
  Administered 2020-08-09 – 2020-08-21 (×25): 1500 mg via INTRAVENOUS
  Filled 2020-08-09 (×27): qty 100

## 2020-08-09 MED ORDER — VANCOMYCIN HCL IN DEXTROSE 1-5 GM/200ML-% IV SOLN
1000.0000 mg | Freq: Two times a day (BID) | INTRAVENOUS | Status: DC
  Administered 2020-08-09 – 2020-08-11 (×6): 1000 mg via INTRAVENOUS
  Filled 2020-08-09 (×6): qty 200

## 2020-08-09 NOTE — ED Notes (Signed)
Family updated as to patient's status.

## 2020-08-09 NOTE — Progress Notes (Signed)
PROGRESS NOTE  Cameron Hale:[id number] DOB: [birth year] DOA: 08/08/2020 PCP: Alroy Dust, L.Marlou Sa, MD  Brief History:  44 year old male with a history of neurofibromatosis type II previously followed by Dr. Prince Rome, neurosurgery at [facility], seizure disorder, hard hearing, and legally blind presented to the emergency department with episodes of somnolence and unresponsiveness that began on 08/07/2020. Through the course of the day on 08/07/2020 he returned to his baseline.  Her mother/caregiver called his neurologist who recommend giving extra 500 mg of Keppra that he received at 5 PM yesterday.  He did took his usual dose of 1500 mg twice daily, his last dose was yesterday evening. This morning, 08/08/2020, patient very somnolent, lethargic, and unresponsive.  His family took his blood pressure and it was 60 mmHg systolic.  Her mother called again his neurologist and the recommendation was to call EMS and transport patient to [facility]. On route he had persistent hypotension and he was brought to [facility] emergency department.    Per report patient had been going for chemotherapy every 3 weeks prior to hospitalization reducing size of neurofibromas followed by Dr. Shaaron Adler. patient with recent C5-7 PCF 02/04/2020 for resection of dural based spinal tumor he was initially discharged to inpatient rehab services 5/63/8756 complicated by CSF leak returning back to the hospital requiring wound revision on 02/20/2020 who was discharged to inpatient rehab services/[facility] on 02/22/2020.  He was subsequently readmitted to the hospital for concerns of subclinical seizure.  In all, he spent about 5 months in the hospital in total.  He was subsequently discharged to [facility] where he spent time from 05/09/2020 to 06/05/2020. Thereafter, the patient was discharged home under the care of his mother and father who are his primary caregivers.  At baseline, the patient is essentially  bedbound but is able to communicate and express his needs.  He is a functional quadriplegic requiring care for all his activities of daily living.  ED Course: Patient unresponsive, his CT head with advanced meningiomatosis, unchanged vasogenic edema.  No acute bleeding.  Arterial blood gas analysis with normal pH.  Chest radiograph with signs of aspiration pneumonia.  [facility] was contacted, unfortunately unable to perform ED to ED transfer. Patient received intravenous fluids, dexamethasone, 2 g of Keppra, 2 mg IV lorazepam, aztreonam, vancomycin, cefepime and metronidazole. Neurology at [facility], was consulted and recommended transfer.  Assessment/Plan: Sepsis -Present on admission -Patient had hypothermia, hypotension, and altered mental status -Secondary to aspiration pneumonia -Continue empiric vancomycin, cefepime, metronidazole pending culture data -Lactic acid peaked at 0.8 -Follow blood cultures -UA without significant pyuria -Continue Solu-Cortef  Acute metabolic encephalopathy -Multifactorial including postictal state, medication and related from high-dose Keppra, and sepsis -08/09/2020--patient is awake and intermittently follows commands, but is noncommunicative verbally and remains somnolent  Aspiration pneumonia -Continue cefepime and metronidazole -Personally reviewed chest x-ray--left-sided patchy infiltrates -Will ultimately need speech therapy evaluation once more awake  Acute respiratory failure with hypoxia -Secondary to pneumonia -Currently stable on 2 L nasal cannula with saturation 95-96% -Wean oxygen as tolerated  Seizure disorder -Restart Keppra 1500 mg IV twice daily -Obtain EEG -Obtain neurology consult while here any pain while waiting for transfer to [facility] as bed is not currently available -CT brain shows extensive meningomatosis with 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  Patient has been taking brigatinib as part of clinical trial.      Status is: Inpatient  Remains inpatient appropriate because:IV treatments appropriate due to intensity of illness or inability to take PO   Dispo: The patient is from: Home              Anticipated d/c is to: Home              Anticipated d/c date is: > 3 days              Patient currently is not medically stable to d/c.        Family Communication:   Mother updated at bedside 9/22  Consultants:  neurology  Code Status:  FULL--confirmed with mother  DVT Prophylaxis:  [place] Lovenox   Procedures: As Listed in Progress Note Above  Antibiotics: vanco 9/21>> Cefepime 9/21>> Flagyl 9/21>>     Subjective: Patient is awake but intermittently only follows one-step commands.  He is not verbally communicative.  No reports of respiratory distress, vomiting, diarrhea, chest pain, uncontrolled pain.  Objective: Vitals:   08/09/20 0600 08/09/20 0630 08/09/20 0730 08/09/20 0800  BP: (!) 102/57 (!) 92/53 (!) 92/56 (!) 95/50  Pulse: 89 (!) 53 94 88  Resp: (!) 21 20 (!) 23 (!) 23  Temp: 97.7 F (36.5 C) 97.9 F (36.6 C) 98.4 F (36.9 C) 98.6 F (37 C)  TempSrc:      SpO2: 97% 97% 95% 96%  Weight:        Intake/Output Summary (Last 24 hours) at 08/09/2020 0813 Last data filed at 08/08/2020 1437 Gross per 24 hour  Intake 2787.6 ml  Output 250 ml  Net 2537.6 ml   Weight change:  Exam:   General:  Pt is alert, follows commands appropriately, not in acute distress  HEENT: No icterus, No thrush, No neck mass, [place]/AT  Cardiovascular: RRR, S1/S2, no rubs, no gallops  Respiratory: Bilateral rhonchi.  Abdomen: Soft/+BS, non tender, non distended, no  guarding  Extremities: trace LE edema, No lymphangitis, No petechiae, No rashes, no synovitis   Data Reviewed: I have personally reviewed following labs and imaging studies Basic Metabolic Panel: Recent Labs  Lab 08/08/20 1247 08/09/20 0449  NA 141 142  K 3.7 3.5  CL 111 111  CO2 25 24  GLUCOSE 62* 108*  BUN 23* 17  CREATININE <0.30* <0.30*  CALCIUM 7.7* 8.4*   Liver Function Tests: Recent Labs  Lab 08/08/20 1247  AST 163*  ALT 233*  ALKPHOS 102  BILITOT 0.3  PROT 4.4*  ALBUMIN 1.7*   No results for input(s): LIPASE, AMYLASE in the last 168 hours. No results for input(s): AMMONIA in the last 168 hours. Coagulation Profile: Recent Labs  Lab 08/08/20 1403  INR 1.0   CBC: Recent Labs  Lab 08/08/20 1247 08/09/20 0449  WBC 10.3 11.0*  NEUTROABS 8.8*  --   HGB 9.5* 9.9*  HCT 30.3* 31.4*  MCV 85.6 83.3  PLT 113* 144*   Cardiac Enzymes: Recent Labs  Lab 08/08/20 1320  CKTOTAL 30*   BNP: Invalid input(s): POCBNP CBG: Recent Labs  Lab 08/08/20 1544  GLUCAP 122*   HbA1C: No results for input(s): HGBA1C in the last 72 hours. Urine analysis:    Component Value Date/Time   COLORURINE YELLOW 08/08/2020 [place] 08/08/2020 1208   LABSPEC 1.016 08/08/2020 1208  PHURINE 5.0 08/08/2020 [place] 08/08/2020 [place] 08/08/2020 [place] 08/08/2020 [place] 08/08/2020 1208   PROTEINUR NEGATIVE 08/08/2020 1208   NITRITE NEGATIVE 08/08/2020 1208   LEUKOCYTESUR TRACE (A) 08/08/2020 1208   Sepsis Labs: @LABRCNTIP (procalcitonin:4,lacticidven:4) ) Recent Results (from the past 240 hour(s))  SARS Coronavirus 2 by RT PCR (hospital order, performed in [facility] hospital lab) Nasopharyngeal Nasopharyngeal Swab     Status: None   Collection Time: 08/08/20 12:37 PM   Specimen: Nasopharyngeal Swab  Result Value Ref Range Status   SARS Coronavirus 2 NEGATIVE NEGATIVE Final     Comment: (NOTE) SARS-CoV-2 target nucleic acids are NOT DETECTED.  The SARS-CoV-2 RNA is generally detectable in upper and lower respiratory specimens during the acute phase of infection. The lowest concentration of SARS-CoV-2 viral copies this assay can detect is 250 copies / mL. A negative result does not preclude SARS-CoV-2 infection and should not be used as the sole basis for treatment or other patient management decisions.  A negative result may occur with improper specimen collection / handling, submission of specimen other than nasopharyngeal swab, presence of viral mutation(s) within the areas targeted by this assay, and inadequate number of viral copies (<250 copies / mL). A negative result must be combined with clinical observations, patient history, and epidemiological information.  Fact Sheet for Patients:   StrictlyIdeas.no  Fact Sheet for Healthcare Providers: BankingDealers.co.za  This test is not yet approved or  cleared by the [place] FDA and has been authorized for detection and/or diagnosis of SARS-CoV-2 by FDA under an Emergency Use Authorization (EUA).  This EUA will remain in effect (meaning this test can be used) for the duration of the COVID-19 declaration under Section 564(b)(1) of the Act, 21 U.S.C. section 360bbb-3(b)(1), unless the authorization is terminated or revoked sooner.  Performed at [facility], [address] [place], [place] [address]   Blood Culture (routine x 2)     Status: None (Preliminary result)   Collection Time: 08/08/20 12:47 PM   Specimen: BLOOD RIGHT HAND  Result Value Ref Range Status   Specimen Description   Final    BLOOD RIGHT HAND BOTTLES DRAWN AEROBIC AND ANAEROBIC   Special Requests   Final    Blood Culture results may not be optimal due to an inadequate volume of blood received in culture bottles   Culture   Final    NO GROWTH < 24 HOURS Performed at [facility], [address] [place], [place] [address]    Report Status PENDING  Incomplete  Blood Culture (routine x 2)     Status: None (Preliminary result)   Collection Time: 08/08/20  1:20 PM   Specimen: BLOOD RIGHT FOREARM  Result Value Ref Range Status   Specimen Description   Final    BLOOD RIGHT FOREARM BOTTLES DRAWN AEROBIC AND ANAEROBIC   Special Requests   Final    Blood Culture results may not be optimal due to an excessive volume of blood received in culture bottles   Culture   Final    NO GROWTH < 24 HOURS Performed at [facility], [address] [place], [place] [address]    Report Status PENDING  Incomplete     Scheduled Meds: . enoxaparin (LOVENOX) injection  40 mg Subcutaneous Q24H  . hydrocortisone sod succinate (SOLU-CORTEF) inj  100 mg Intravenous Q8H   Continuous Infusions: . ceFEPime (MAXIPIME) IV Stopped (08/09/20 0540)  .  dextrose 5% lactated ringers 100 mL/hr at 08/09/20 0443  . levETIRAcetam      Procedures/Studies: CT Head Wo Contrast  Result Date: 08/08/2020 CLINICAL DATA:  Cerebral hemorrhage suspected. Additional history provided: History of NF 2, history of seizures, last seizure yesterday, decreased full of consciousness, low blood pressure. EXAM: CT HEAD WITHOUT CONTRAST TECHNIQUE: Contiguous axial images were obtained from the base of the skull through the vertex without intravenous contrast. COMPARISON:  Prior brain MRI 05/24/2020.  Prior head CT 09/10/2017. FINDINGS: Brain: Known extensive meningiomatosis was more fully characterized on the brain MRI of 05/24/2020. Most notably, there is a dominant parafalcine meningioma as well as extensive en plaque meningiomas greatest overlying the anterior and superior cerebral convexities. Numerous additional smaller supratentorial and infratentorial meningiomas were better appreciated on the prior MRI. A right vestibular schwannoma was also better appreciated on this prior study. There is limited assessment for  interval change in size of these masses due to noncontrast technique on the current study and differences in modality. Prominent vasogenic edema within the bilateral mid to anterior frontal lobes and crossing the corpus callosum does not appear significantly changed. Redemonstrated small nonspecific scattered foci of parenchymal calcification. Questionable asymmetric enlargement of the right hippocampus was better appreciated on the prior MRI. As before, there is partial herniation of the posterior rectus gyri into the sella turcica. There is no acute intracranial hemorrhage. No demarcated cortical infarct is identified. No extra-axial fluid collection. No midline shift. Vascular: No hyperdense vessel.  Atherosclerotic calcifications. Skull: Sequela of prior bifrontal parietal craniotomy/cranioplasty. Sinuses/Orbits: Redemonstrated small foci of calcification along the right greater than left intraorbital optic nerve sheaths suspicious for optic nerve sheath meningiomas. Mild right frontal sinus mucosal thickening. 16 mm osteoma within the left frontoethmoidal recess. No significant mastoid effusion. IMPRESSION: No evidence of acute intracranial hemorrhage or acute demarcated cortical infarct. Extensive meningiomatosis more fully characterized on the recent prior brain MRI of 05/24/2020. Most notably, this includes a large parafalcine meningioma as well as extensive en plaque meningiomas greatest along the anterior and superior cerebral convexities. Unchanged prominent vasogenic edema within the underlying mid-to-anterior frontal lobes and crossing the corpus callosum. As before, there is herniation of the rectus gyri into the sella turcica. 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 A known right vestibular schwannoma was better appreciated on the prior MRI. Questionable asymmetric enlargement  of the right hippocampus was also better appreciated on the prior MRI. Electronically Signed   By: Kellie Simmering DO   On: 08/08/2020 14:07   DG Chest Port 1 View  Result Date: 08/08/2020 CLINICAL DATA:  Questionable sepsis.  COVID test pending. EXAM: PORTABLE CHEST 1 VIEW COMPARISON:  None. FINDINGS: Multifocal airspace opacities in the left lung. No pleural effusions. No discernible pneumothorax. Spinal stabilization hardware, partially imaged. Polyarticular degenerative change without evidence of acute fracture. IMPRESSION: Multifocal airspace opacities in the left lung, concerning for pneumonia. Recommend follow-up to resolution. Electronically Signed   By: Margaretha Sheffield MD   On: 08/08/2020 13:12    Orson Eva, DO  [facility]  If 7PM-7AM, please contact night-coverage [email or web address] Password [facility] 08/09/2020, 8:13 AM   LOS: 1 day

## 2020-08-09 NOTE — Progress Notes (Signed)
EEG complete - results pending 

## 2020-08-09 NOTE — Procedures (Addendum)
Patient Name: Cameron Hale  [name]: [id number]  Epilepsy Attending: Lora Havens  Referring Physician/Provider: Dr Sander Radon Date: 08/09/2020 Duration: 26.05 mins  Patient history: 44 year old male with functional quadriparesis, hearing and visual loss with history of neurofibromatosis type II and seizures with prior event of status epilepticus who presents with altered mentation.  24 hours ago he was noted to have a generalized seizure that recovered by itself. EEG to evaluate for seizure.   Level of alertness: Awake, asleep  AEDs during EEG study: Keppra  Technical aspects: This EEG study was done with scalp electrodes positioned according to the 10-20 International system of electrode placement. Electrical activity was acquired at a sampling rate of 500Hz  and reviewed with a high frequency filter of 70Hz  and a low frequency filter of 1Hz . EEG data were recorded continuously and digitally stored.   Description: No posterior dominant rhythm was seen. Sleep was characterized by vertex waves, maximal frontocentral region. EEG showed continuous generalized 3 to 6 Hz theta-delta slowing. Hyperventilation and photic stimulation were not performed.     ABNORMALITY -Continuous slow, generalized  IMPRESSION: This study is suggestive of moderate diffuse encephalopathy, nonspecific etiology. No seizures or epileptiform discharges were seen throughout the recording.  [name] [name]

## 2020-08-09 NOTE — Consult Note (Signed)
[place] A. Cameron Laughter, MD     [email or web address]          Cameron Hale is an 44 y.o. male.   ASSESSMENT/PLAN: 1.  Altered mental status: Etiology is likely due to hypotension.  Etiology for the hypotension is unclear but he is currently being worked up for sepsis.  It is unlikely that the altered mental status is due to seizures.  The patient's propanolol low-dose which he has been taking prehospital has been discontinued.  The propranolol was been used to treat action tremor.  Replacement may not be warranted as the patient is quadriplegic at this time. 2.  Seizure disorder: Continue with Keppra at the current dose which was previously prescribed. 3.  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 4.  Quadriplegic status post cervical spine surgery March of this year complicated postoperatively with infection. 5.  Chronic immunosuppression on steroids.  The patient has been given acute doses of the corticosteroids as he is currently hospitalized and is hypotensive and hypothermic. 6.  Hypothermia possibly as part of the sepsis picture with hypotension. 7.  Action tremor   The patient is an 44 year old white male who has a baseline history of neurofibromatosis type II with the extent of cervical neuromas.  The patient underwent surgery March of this year which unfortunately was complicated by postoperative infection.  The patient did have some difficulties ambulating preoperatively using a cane but was able to drive and function decently.  The patient unfortunately has been left with quadriplegia since his surgery and related complications.  History obtained from the chart and in talking with the mother.  She tells me that he also has a long history of epilepsy.  His seizures were very well controlled on 500 mg twice a day.  It appears that after his procedures he is develop more staring spells which were thought to be  epileptic in nature.  His seizure medication has subsequently been escalated to 1500 mg twice a day.  On Monday of this week the patient was noted to have be having a staring spell.  The mother thought that he may have had a seizure.  The following day the patient was quite stuporous and unresponsive which led to the patient being taken to the hospital.  The patient's blood pressure was low at that time.  The mother reports of being 13/40.  She reports that since he left the hospital his blood pressure has been running lower than normal in the low 100s over 40s and 50s.  He is on low-dose propanolol because of action tremor related to his neurofibromatosis and the cervical neurofibromas.  The patient's work-up has been mostly unrevealing at this time.  He has had an EEG showing generalized slowing but no epileptiform discharges.  It appears that the patient is back to baseline cognitively and has no complaints at this time.  He is noted to have impaired eye movements and reduced vision which they tell me a lot of this is chronic.  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  The review of systems otherwise unrevealing.    GENERAL: This is a pleasant male who is in no significant acute distress at this time.  HEENT: There is extensive cranial deformity/defect likely from cranial procedures.  ABDOMEN: soft  EXTREMITIES: No edema   BACK: Normal  SKIN: Normal by inspection.    MENTAL STATUS: He lays in bed with eyes closed but opens his eyes spontaneously.  He  speaks appropriately and is lucid.  Speech is mildly stuttering.  CRANIAL NERVES: Pupils are equal, round and reactive to light; extra ocular movements revealed markedly impaired vertical eye movements on both sides.  There is impaired eye movements of the left eye pass midline on abduction.  Right eye movements are normal horizontally.  Vision involving the left eye is severely severely impaired seeing only shadows.  The upper and lower facial muscles  are normal in strength and symmetric, there is no flattening of the nasolabial folds; tongue is midline.  MOTOR: There is a flicker of movement involving the left hand.  Otherwise all muscle groups are flaccid 0/5 with reduced tone.  COORDINATION: No tremors noted.  No parkinsonian features noted.  No dysmetria.  REFLEXES: Deep tendon reflexes are symmetrical and normal.       Blood pressure 98/60, pulse 82, temperature 97.9 F (36.6 C), resp. rate (!) 26, weight 65.3 kg, SpO2 96 %.  Past Medical History:  Diagnosis Date  . NF2 (neurofibromatosis 2) ([place])   . Seizures ([place])     Past Surgical History:  Procedure Laterality Date  . brain bleed     mri 2009  . HAND EXPLORATION    . LUMBAR FUSION    . NEPHRECTOMY     Partial left nephrectomy   . SPINAL FUSION      No family history on file.  Social History:  reports that he has never smoked. He has never used smokeless tobacco. He reports previous alcohol use. He reports that he does not use drugs.  Allergies:  Allergies  Allergen Reactions  . Amoxicillin Itching  . Morphine Itching    Medications: Prior to Admission medications   Medication Sig Start Date End Date Taking? Authorizing Provider  acetaminophen (TYLENOL) 325 MG tablet Take 2 tablets (650 mg total) by mouth every 6 (six) hours as needed for mild pain or fever. 06/05/20  Yes Angiulli, Lavon Paganini, PA-C  baclofen (LIORESAL) 10 MG tablet Take 1 tablet (10 mg total) by mouth 2 (two) times daily. 06/14/20  Yes Lovorn, Megan, MD  brigatinib (ALUNBRIG) 90 & 180 MG TBPK Take 90-180 mg by mouth daily.  06/07/20  Yes [provider]  carboxymethylcellulose (REFRESH TEARS) 0.5 % SOLN Place 1 drop into both eyes daily as needed (For dry eyes).   Yes [provider]  ciprofloxacin (CIPRO) 500 MG tablet Take 500 mg by mouth 2 (two) times daily.  08/01/20  Yes [provider]  dexamethasone (DECADRON) 2 MG tablet Take 2 mg by mouth daily.  06/21/20  Yes  [provider]  famotidine (PEPCID) 20 MG tablet Take 1 tablet (20 mg total) by mouth 2 (two) times daily. 07/21/20  Yes Lovorn, Jinny Blossom, MD  levETIRAcetam (KEPPRA) 500 MG tablet Take 3 tablets (1,500 mg total) by mouth 2 (two) times daily. 06/05/20 08/08/20 Yes Angiulli, Lavon Paganini, PA-C  propranolol (INDERAL) 10 MG tablet Take 1 tablet (10 mg total) by mouth 2 (two) times daily. 06/05/20  Yes Angiulli, Lavon Paganini, PA-C  ammonium lactate (AMLACTIN) 12 % lotion Apply 1 application topically as needed for dry skin. Patient not taking: Reported on 08/08/2020 06/28/20   Felipa Furnace, DPM  bisacodyl (DULCOLAX) 10 MG suppository Place 1 suppository (10 mg total) rectally daily at 6 PM. Patient not taking: Reported on 08/08/2020 06/05/20   Angiulli, Lavon Paganini, PA-C    Scheduled Meds: . enoxaparin (LOVENOX) injection  40 mg Subcutaneous Q24H  . hydrocortisone sod succinate (SOLU-CORTEF) inj  100 mg Intravenous Q8H   Continuous Infusions: . ceFEPime (MAXIPIME) IV Stopped (08/09/20 1335)  . dextrose 5% lactated ringers 100 mL/hr at 08/09/20 1749  . levETIRAcetam Stopped (08/09/20 0840)  . metronidazole 500 mg (08/09/20 1627)  . vancomycin Stopped (08/09/20 1200)   PRN Meds:.acetaminophen **OR** acetaminophen, LORazepam, ondansetron **OR** ondansetron (ZOFRAN) IV     Results for orders placed or performed during the hospital encounter of 08/08/20 (from the past 48 hour(s))  Urinalysis, Routine w reflex microscopic     Status: Abnormal   Collection Time: 08/08/20 12:08 PM  Result Value Ref Range   Color, Urine YELLOW YELLOW   APPearance CLEAR CLEAR   Specific Gravity, Urine 1.016 1.005 - 1.030   pH 5.0 5.0 - 8.0   Glucose, UA NEGATIVE NEGATIVE mg/dL   Hgb urine dipstick NEGATIVE NEGATIVE   Bilirubin Urine NEGATIVE NEGATIVE   Ketones, ur NEGATIVE NEGATIVE mg/dL   Protein, ur NEGATIVE NEGATIVE mg/dL   Nitrite NEGATIVE NEGATIVE   Leukocytes,Ua TRACE (A) NEGATIVE   RBC / HPF 0-5 0 - 5 RBC/hpf     WBC, UA 6-10 0 - 5 WBC/hpf   Bacteria, UA RARE (A) NONE SEEN   Mucus PRESENT    Sperm, UA PRESENT     Comment: Performed at [facility], [address] [place], [place] [address]  HIV Antibody (routine testing w rflx)     Status: None   Collection Time: 08/08/20 12:35 PM  Result Value Ref Range   HIV Screen 4th Generation wRfx Non Reactive Non Reactive    Comment: Performed at [facility] [address], [place], [place] [address]  SARS Coronavirus 2 by RT PCR (hospital order, performed in [facility] hospital lab) Nasopharyngeal Nasopharyngeal Swab     Status: None   Collection Time: 08/08/20 12:37 PM   Specimen: Nasopharyngeal Swab  Result Value Ref Range   SARS Coronavirus 2 NEGATIVE NEGATIVE    Comment: (NOTE) SARS-CoV-2 target nucleic acids are NOT DETECTED.  The SARS-CoV-2 RNA is generally detectable in upper and lower respiratory specimens during the acute phase of infection. The lowest concentration of SARS-CoV-2 viral copies this assay can detect is 250 copies / mL. A negative result does not preclude SARS-CoV-2 infection and should not be used as the sole basis for treatment or other patient management decisions.  A negative result may occur with improper specimen collection / handling, submission of specimen other than nasopharyngeal swab, presence of viral mutation(s) within the areas targeted by this assay, and inadequate number of viral copies (<250 copies / mL). A negative result must be combined with clinical observations, patient history, and epidemiological information.  Fact Sheet for Patients:   StrictlyIdeas.no  Fact Sheet for Healthcare Providers: BankingDealers.co.za  This test is not yet approved or  cleared by the [place] FDA and has been authorized for detection and/or diagnosis of SARS-CoV-2 by FDA under an Emergency Use Authorization (EUA).  This EUA will remain in effect (meaning this  test can be used) for the duration of the COVID-19 declaration under Section 564(b)(1) of the Act, 21 U.S.C. section 360bbb-3(b)(1), unless the authorization is terminated or revoked sooner.  Performed at [facility], [address], [place], [place] [address]   Lactic acid, plasma     Status: None   Collection Time: 08/08/20 12:47 PM  Result Value Ref Range   Lactic Acid, Venous 0.8 0.5 - 1.9 mmol/L    Comment: Performed at [facility], [address] [place],  [place] [address]  Comprehensive metabolic panel     Status: Abnormal   Collection Time: 08/08/20 12:47 PM  Result Value Ref Range   Sodium 141 135 - 145 mmol/L   Potassium 3.7 3.5 - 5.1 mmol/L   Chloride 111 98 - 111 mmol/L   CO2 25 22 - 32 mmol/L   Glucose, Bld 62 (L) 70 - 99 mg/dL    Comment: Glucose reference range applies only to samples taken after fasting for at least 8 hours.   BUN 23 (H) 6 - 20 mg/dL   Creatinine, Ser <0.30 (L) 0.61 - 1.24 mg/dL   Calcium 7.7 (L) 8.9 - 10.3 mg/dL   Total Protein 4.4 (L) 6.5 - 8.1 g/dL   Albumin 1.7 (L) 3.5 - 5.0 g/dL   AST 163 (H) 15 - 41 U/L   ALT 233 (H) 0 - 44 U/L   Alkaline Phosphatase 102 38 - 126 U/L   Total Bilirubin 0.3 0.3 - 1.2 mg/dL   GFR calc non Af Amer NOT CALCULATED >60 mL/min   GFR calc Af Amer NOT CALCULATED >60 mL/min   Anion gap 5 5 - 15    Comment: Performed at [facility], [address], [place], [place] [address]  CBC WITH DIFFERENTIAL     Status: Abnormal   Collection Time: 08/08/20 12:47 PM  Result Value Ref Range   WBC 10.3 4.0 - 10.5 K/uL   RBC 3.54 (L) 4.22 - 5.81 MIL/uL   Hemoglobin 9.5 (L) 13.0 - 17.0 g/dL   HCT 30.3 (L) 39 - 52 %   MCV 85.6 80.0 - 100.0 fL   MCH 26.8 26.0 - 34.0 pg   MCHC 31.4 30.0 - 36.0 g/dL   RDW 17.5 (H) 11.5 - 15.5 %   Platelets 113 (L) 150 - 400 K/uL    Comment: PLATELET COUNT CONFIRMED BY SMEAR SPECIMEN CHECKED FOR CLOTS    nRBC 0.0 0.0 - 0.2 %   Neutrophils Relative % 85 %   Neutro Abs 8.8 (H) 1.7 - 7.7 K/uL    Lymphocytes Relative 8 %   Lymphs Abs 0.8 0.7 - 4.0 K/uL   Monocytes Relative 4 %   Monocytes Absolute 0.4 0 - 1 K/uL   Eosinophils Relative 0 %   Eosinophils Absolute 0.0 0 - 0 K/uL   Basophils Relative 0 %   Basophils Absolute 0.0 0 - 0 K/uL   Immature Granulocytes 3 %   Abs Immature Granulocytes 0.29 (H) 0.00 - 0.07 K/uL    Comment: Performed at [facility], [address] [place], [place] [address]  Blood Culture (routine x 2)     Status: None (Preliminary result)   Collection Time: 08/08/20 12:47 PM   Specimen: BLOOD RIGHT HAND  Result Value Ref Range   Specimen Description      BLOOD RIGHT HAND BOTTLES DRAWN AEROBIC AND ANAEROBIC   Special Requests      Blood Culture results may not be optimal due to an inadequate volume of blood received in culture bottles   Culture      NO GROWTH < 24 HOURS Performed at [facility], [address] [place], [place] [address]    Report Status PENDING   Blood Culture (routine x 2)     Status: None (Preliminary result)   Collection Time: 08/08/20  1:20 PM   Specimen: BLOOD RIGHT FOREARM  Result Value Ref Range   Specimen Description      BLOOD RIGHT FOREARM BOTTLES DRAWN AEROBIC AND ANAEROBIC  Special Requests      Blood Culture results may not be optimal due to an excessive volume of blood received in culture bottles   Culture      NO GROWTH < 24 HOURS Performed at [facility], [address] [place], [place] [address]    Report Status PENDING   CK     Status: Abnormal   Collection Time: 08/08/20  1:20 PM  Result Value Ref Range   Total CK 30 (L) 49.0 - 397.0 U/L    Comment: Performed at [facility], [address], [place], [place] [address]  Lactic acid, plasma     Status: None   Collection Time: 08/08/20  2:03 PM  Result Value Ref Range   Lactic Acid, Venous 0.6 0.5 - 1.9 mmol/L    Comment: Performed at [facility], [address], [place], [place] [address]  Protime-INR     Status: None   Collection Time: 08/08/20  2:03 PM   Result Value Ref Range   Prothrombin Time 12.6 11.4 - 15.2 seconds   INR 1.0 0.8 - 1.2    Comment: (NOTE) INR goal varies based on device and disease states. Performed at [facility], [address], [place], [place] [address]   APTT     Status: Abnormal   Collection Time: 08/08/20  2:03 PM  Result Value Ref Range   aPTT 39 (H) 24 - 36 seconds    Comment:        IF BASELINE aPTT IS ELEVATED, SUGGEST PATIENT RISK ASSESSMENT BE USED TO DETERMINE APPROPRIATE ANTICOAGULANT THERAPY. Performed at [facility] [address], [place], [place] [address]   Blood gas, arterial     Status: None   Collection Time: 08/08/20  3:25 PM  Result Value Ref Range   FIO2 24.00    pH, Arterial 7.367 7.35 - 7.45   pCO2 arterial 40.9 32 - 48 mmHg   pO2, Arterial 85.9 83 - 108 mmHg   Bicarbonate 23.1 20.0 - 28.0 mmol/L   Acid-base deficit 0.9 0.0 - 2.0 mmol/L   O2 Saturation 97.4 %   Patient temperature 31.7    Allens test (pass/fail) PASS PASS    Comment: Performed at [facility], [address], [place], [place] [address]  Rapid urine drug screen (hospital performed)     Status: None   Collection Time: 08/08/20  3:26 PM  Result Value Ref Range   Opiates NONE DETECTED NONE DETECTED   Cocaine NONE DETECTED NONE DETECTED   Benzodiazepines NONE DETECTED NONE DETECTED   Amphetamines NONE DETECTED NONE DETECTED   Tetrahydrocannabinol NONE DETECTED NONE DETECTED   Barbiturates NONE DETECTED NONE DETECTED    Comment: (NOTE) DRUG SCREEN FOR MEDICAL PURPOSES ONLY.  IF CONFIRMATION IS NEEDED FOR ANY PURPOSE, NOTIFY LAB WITHIN 5 DAYS.  LOWEST DETECTABLE LIMITS FOR URINE DRUG SCREEN Drug Class                     Cutoff (ng/mL) Amphetamine and metabolites    1000 Barbiturate and metabolites    200 Benzodiazepine                 979 Tricyclics and metabolites     300 Opiates and metabolites        300 Cocaine and metabolites        300 THC  11 Performed at [facility], [address] [place], [place] [address]   CBG monitoring, ED     Status: Abnormal   Collection Time: 08/08/20  3:44 PM  Result Value Ref Range   Glucose-Capillary 122 (H) 70 - 99 mg/dL    Comment: Glucose reference range applies only to samples taken after fasting for at least 8 hours.  Basic metabolic panel     Status: Abnormal   Collection Time: 08/09/20  4:49 AM  Result Value Ref Range   Sodium 142 135 - 145 mmol/L   Potassium 3.5 3.5 - 5.1 mmol/L   Chloride 111 98 - 111 mmol/L   CO2 24 22 - 32 mmol/L   Glucose, Bld 108 (H) 70 - 99 mg/dL    Comment: Glucose reference range applies only to samples taken after fasting for at least 8 hours.   BUN 17 6 - 20 mg/dL   Creatinine, Ser <0.30 (L) 0.61 - 1.24 mg/dL   Calcium 8.4 (L) 8.9 - 10.3 mg/dL   GFR calc non Af Amer NOT CALCULATED >60 mL/min   GFR calc Af Amer NOT CALCULATED >60 mL/min   Anion gap 7 5 - 15    Comment: Performed at [facility], [address], [place], [place] [address]  CBC     Status: Abnormal   Collection Time: 08/09/20  4:49 AM  Result Value Ref Range   WBC 11.0 (H) 4.0 - 10.5 K/uL   RBC 3.77 (L) 4.22 - 5.81 MIL/uL   Hemoglobin 9.9 (L) 13.0 - 17.0 g/dL   HCT 31.4 (L) 39 - 52 %   MCV 83.3 80.0 - 100.0 fL   MCH 26.3 26.0 - 34.0 pg   MCHC 31.5 30.0 - 36.0 g/dL   RDW 17.4 (H) 11.5 - 15.5 %   Platelets 144 (L) 150 - 400 K/uL   nRBC 0.0 0.0 - 0.2 %    Comment: Performed at [facility], [address], [place], [place] [address]    Studies/Results:  EEG  Technical aspects: This EEG study was done with scalp electrodes positioned according to the 10-20 International system of electrode placement. Electrical activity was acquired at a sampling rate of 500Hz  and reviewed with a high frequency filter of 70Hz  and a low frequency filter of 1Hz . EEG data were recorded continuously and digitally stored.    Description: No posterior dominant rhythm was seen. Sleep was characterized by vertex waves, maximal  frontocentral region. EEG showed continuous generalized 3 to 6 Hz theta-delta slowing. Hyperventilation and photic stimulation were not performed.      ABNORMALITY -Continuous slow, generalized   IMPRESSION: This study is suggestive of moderate diffuse encephalopathy, nonspecific etiology. No seizures or epileptiform discharges were seen throughout the recording.      BRAIN MRI W/WO 05-2020 FINDINGS: Brain: No restricted diffusion or evidence of acute infarction. No ventriculomegaly. Basilar cistern patency is stable, including partial effacement of the prepontine cistern on the left. No acute intracranial hemorrhage identified. Occasional scattered chronic blood products appears stable on SWI.   Chronic Meningiomatosis. The dominant central para falcine meningioma is less enhancing compared to prior studies. And overall size is slightly smaller (from 42 to 39 mm diameter since November).   Extensive en plaque type meningiomas along both superior convexities redemonstrated. Those in the midline and over the right convexity appears stable since 2020, but that over the left convexity on series 13, image 16 today has increased and is now up to 11 mm in  thickness (versus 9-10 mm in March and 7-8 mm in November).   Anterior bifrontal en plaque meningioma involvement also has mildly progressed since November, with up to 10 mm bilateral meningioma thickness now (series 14, image 9 on the left) versus 7 mm or less in November.   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   Subsequently, confluent bilateral frontal lobe vasogenic edema has mildly increased since March, including adjacent to the left frontal horn and at the anterior corpus callosum on series 6, image 15 today (compare to series 8, image 29 and March).   On thin slice coronal T2 images the right hippocampal formation appears slightly larger and more T2 hyperintense  compared to the left (series 9, image 16) which may be new since priors.   Vascular: Major intracranial vascular flow voids are stable. 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 The torcula, transverse and sigmoid sinuses are enhancing and appear to remain patent.   Skull and upper cervical spine: Chronic nodular spinal cord enhancement at C1-C2 appears stable since March. Stable visible bone marrow signal.   Sinuses/Orbits: There appears to be new herniation of the bilateral gyrus rectus into the sella turcica (series 9, image 28). But no associated parenchymal edema. The pituitary has been chronically difficult to delineate from the infiltrative cavernous sinus meningioma, and is also not clearly identified today.   Subsequent mild mass effect suspected on the pre chiasmatic optic nerves, but intraorbital soft tissues otherwise appear stable. Paranasal sinuses and mastoids are stable and well pneumatized.   Other: Stable enhancing right vestibular schwannoma relatively sparing the cerebellopontine angle (series 12, image 16). No acute scalp soft tissue finding identified.   IMPRESSION: 1. Extensive Meningiomatosis in keeping with NF-2. Slowly enlarging en plaque type meningiomas along both anterior and superior cerebral convexities. Associated mildly increased vasogenic edema in the frontal lobes and anterior corpus callosum since March, greater on the left. And partial herniation of the posterior rectus gyri into the sella turcica (series 9, image 28), new since November. Meanwhile, the dominant central para falcine meningioma is less enhancing and appears slightly smaller since November.   2. Questionable asymmetric T2 hyperintensity and enlargement of the right hippocampus.   3. 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   4. Negative for acute infarct.      HEAD CT IMPRESSION: No  evidence of acute intracranial hemorrhage or acute demarcated cortical infarct.   Extensive meningiomatosis more fully characterized on the recent prior brain MRI of 05/24/2020. Most notably, this includes a large parafalcine meningioma as well as extensive en plaque meningiomas greatest along the anterior and superior cerebral convexities. Unchanged prominent vasogenic edema within the underlying mid-to-anterior frontal lobes and crossing the corpus callosum. As before, there is herniation of the rectus gyri into the sella turcica.   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   A known right vestibular schwannoma was better appreciated on the prior MRI.   Questionable asymmetric enlargement of the right hippocampus was also better appreciated on the prior MRI.      The head CT scan and the recent MRI scans are reviewed in person no acute changes are noted on the CT scan. There is evidence of hypodensity which was seen on the previous MRI scan involving the frontal area. There is again hyperdense stay involving the meninges in multiple  locations including the falx. There is also hyperdensities involving a couple places in the parenchyma particularly the R frontal area and also the ventricular system.         Cassiel Fernandez A. Cameron Hale, M.D.  Diplomate, Tax adviser of Psychiatry and Neurology ( Neurology). 08/09/2020, 6:10 PM

## 2020-08-10 ENCOUNTER — Other Ambulatory Visit: Payer: Self-pay

## 2020-08-10 ENCOUNTER — Encounter (HOSPITAL_COMMUNITY): Payer: Self-pay | Admitting: Internal Medicine

## 2020-08-10 ENCOUNTER — Inpatient Hospital Stay: Payer: Self-pay

## 2020-08-10 DIAGNOSIS — G8250 Quadriplegia, unspecified: Secondary | ICD-10-CM

## 2020-08-10 DIAGNOSIS — R001 Bradycardia, unspecified: Secondary | ICD-10-CM

## 2020-08-10 DIAGNOSIS — Q8502 Neurofibromatosis, type 2: Principal | ICD-10-CM

## 2020-08-10 LAB — TSH: TSH: 1.563 u[IU]/mL (ref 0.350–4.500)

## 2020-08-10 MED ORDER — SODIUM CHLORIDE 0.9% FLUSH: 10.0000 mL | INTRAVENOUS | Status: DC | PRN

## 2020-08-10 MED ORDER — SODIUM CHLORIDE 0.9% FLUSH
10.0000 mL | Freq: Two times a day (BID) | INTRAVENOUS | Status: DC
  Administered 2020-08-10 – 2020-08-18 (×14): 10 mL
  Administered 2020-08-19: 20 mL

## 2020-08-10 MED ORDER — COLLAGENASE 250 UNIT/GM EX OINT
TOPICAL_OINTMENT | Freq: Two times a day (BID) | CUTANEOUS | Status: DC
  Administered 2020-08-14 – 2020-09-01 (×7): 1 via TOPICAL
  Filled 2020-08-10 (×3): qty 30

## 2020-08-10 NOTE — Progress Notes (Signed)
PROGRESS NOTE    Cameron Hale  JKK:[id number] DOB: [birth year] DOA: 08/08/2020 PCP: Alroy Dust, L.Marlou Sa, MD    Brief Narrative: 44 year old male with a history of neurofibromatosis type II previously followed by Dr. Prince Rome, neurosurgery at [facility], seizure disorder, hard of  hearing, and legally blind presented to the [facility] ED with episodes of somnolence and unresponsiveness that began on 08/07/2020.  History is obtained from father at bedside. Through the course of the day on 08/07/2020 he returned to his baseline. Her mother/caregiver called his neurologist who recommend giving extra 500 mg of Keppra that he received . Patient became somnolent, lethargic, and unresponsive. His family took his blood pressure and it was 60 mmHgsystolic. Her mother called again his neurologist and therecommendation was [facility] On route he had persistent hypotension and he was brought to [facility] emergency department.    Patient has been recently diagnosed with neurofibromatosis type II, he underwent neurosurgery intervention in March 2021, post surgery he developed functional quadriplegia, blindness and hearing loss, the only full neurologic function preserved is talking.He spent about 5 months in the hospital.  During his hospitalization he was diagnosed with seizures, and had episode of status epilepticus. He was eventually in August this year discharged home under the care of his parents. Per report Patient had been getting chemotherapy every 3 weeks prior to hospitalization for reducing size of neurofibromas followed by Dr. Shaaron Adler. At baseline, the patient is essentially bedbound but is able to communicate and express his needs.  He is a functional quadriplegic requiring care for all his activities of daily living. He does have a decubitus pressure ulcer stage II.  ED Course:Patient was unresponsive, CT head with advanced meningiomatosis, unchanged  vasogenic edema. No acute bleeding. ABG normal pH. Chest radiograph with signs of aspiration pneumonia. [facility] was contacted, unfortunately unable to perform ED to ED transfer. Patient received intravenous fluids, dexamethasone, 2 g of Keppra,2 mg IV lorazepam, aztreonam,vancomycin, cefepime and metronidazole. Neurology at [facility], was consulted and recommended transfer. Patient transferred to [facility] after 2 days.  Patient is admitted for sepsis and is started on antibiotics.  Patient is back to his baseline cognition.  Patient was seen by neurology recommended to continue Keppra at current dose and sepsis work-up.  Patient found to have bradycardia , EKG : Sinus bradycardia cardiology consulted.    Assessment & Plan:   Principal Problem:   Seizures ([place]) Active Problems:   Paraparesis (HCC)   Mixed conductive and sensorineural hearing loss of right ear with unrestricted hearing of left ear   Quadriplegia (HCC)   AMS (altered mental status)   Aspiration pneumonia ([place])   Neurofibromatosis 2 ([place])   Acute respiratory failure with hypoxia ([place])   Sepsis could be sec.to pneumonia: -Patient had hypothermia, hypotension, and altered mental status present on admission. -Secondary to aspiration pneumonia.  -CXR shows Multifocal airspace opacities in the left lung, possible pneumonia. -Continue empiric vancomycin, cefepime, metronidazole pending culture data -Lactic acid peaked at 0.8 -Blood culture no growth so far. -UA without significant pyuria -Continue Solu-Cortef. -Patient remains afebrile, mild leucocytosis.  Acute metabolic encephalopathy >>> Improved. -Multifactorial including postictal state, medication and related from high-dose Keppra, and sepsis -08/09/2020--patient is awake and intermittently follows commands, but is noncommunicative verbally and remains somnolent -08/10/20: Patient is back to his baseline mental status,  father at bedside and he  confirmed.  Aspiration pneumonia -Continue  Vancomycin , cefepime and metronidazole. -Personally reviewed chest x-ray--left-sided  patchy infiltrates -Will obtain speech therapy evaluation.  Acute respiratory failure with hypoxia -Secondary to pneumonia. -Currently stable on 2 L nasal cannula with saturation 95-96% -Wean oxygen as tolerated  Seizure disorder: -Continue Keppra 1500 mg IV twice daily. -EEG: Moderate diffuse encephalopathy,  no evidence of seizure disorder. - Patient was seen by neurology @ [facility],  recommended to continue Keppra at current dose and sepsis work-up. 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   Essential hypertension -Holding propranolol secondary to soft blood pressure.  Neurofibromatosis type II,extensive disease, positive vasogenic edema.Patient has been taking brigatinibas part of clinical trial.  Sinus Bradycardia:  Patient has developed sinus bradycardia,  heart rate runs bw 37-57.  EKG sinus bradycardia with first-degree AV block.  Cardiology consulted, thinks this could be secondary to Brigatinib,  Propranolol discontinued. EP consulted.  Sacral 2 Decubitus ulcer: POA Wound care consulted.  DVT prophylaxis: Lovenox Code Status: Full Family Communication: Spoke with farther at bed side. Disposition Plan:    Status is: Inpatient  Remains inpatient appropriate because:Inpatient level of care appropriate due to severity of illness   Dispo: The patient is from: Home              Anticipated d/c is to: Home              Anticipated d/c date is: 3 days              Patient currently is not medically stable to d/c.   Consultants:   Neurology, Cardiology  Procedures:  Antimicrobials: Anti-infectives (From admission,  onward)   Start     Dose/Rate Route Frequency Ordered Stop   08/09/20 0900  metroNIDAZOLE (FLAGYL) IVPB 500 mg        500 mg 100 mL/hr over 60 Minutes Intravenous Every 8 hours 08/09/20 0826     08/09/20 0900  vancomycin (VANCOCIN) IVPB 1000 mg/200 mL premix        1,000 mg 200 mL/hr over 60 Minutes Intravenous Every 12 hours 08/09/20 0845     08/09/20 0400  vancomycin (VANCOCIN) IVPB 1000 mg/200 mL premix  Status:  Discontinued        1,000 mg 200 mL/hr over 60 Minutes Intravenous Every 12 hours 08/08/20 1507 08/08/20 1827   08/08/20 2200  ceFEPIme (MAXIPIME) 2 g in sodium chloride 0.9 % 100 mL IVPB        2 g 200 mL/hr over 30 Minutes Intravenous Every 8 hours 08/08/20 1345     08/08/20 1500  vancomycin (VANCOREADY) IVPB 750 mg/150 mL        750 mg 150 mL/hr over 60 Minutes Intravenous  Once 08/08/20 1442 08/08/20 1644   08/08/20 1215  aztreonam (AZACTAM) 2 g in sodium chloride 0.9 % 100 mL IVPB        2 g 200 mL/hr over 30 Minutes Intravenous  Once 08/08/20 1209 08/08/20 1344   08/08/20 1215  metroNIDAZOLE (FLAGYL) IVPB 500 mg        500 mg 100 mL/hr over 60 Minutes Intravenous  Once 08/08/20 1209 08/08/20 1452   08/08/20 1215  vancomycin (VANCOCIN) IVPB 1000 mg/200 mL premix        1,000 mg 200 mL/hr over 60 Minutes Intravenous  Once 08/08/20 1209 08/08/20 1452      Subjective: Patient was seen and examined at  bedside.  Overnight events noted.  Patient is alert oriented x 2.  He is not able to move his extremities,  not able to grasp the finger.  Father at bedside states he is at his baseline mental status.  Objective: Vitals:   08/10/20 1137 08/10/20 1338 08/10/20 1526 08/10/20 1544  BP: (!) 147/97 (!) 132/107 (!) 154/125 (!) 151/90  Pulse: (!) 45  (!) 39 (!) 41  Resp: 18 18 16 18   Temp: 97.9 F (36.6 C)     TempSrc:      SpO2: 99% 100% 100% 99%  Weight:        Intake/Output Summary (Last 24 hours) at 08/10/2020 1627 Last data filed at 08/10/2020 1139 Gross per 24  hour  Intake --  Output 800 ml  Net -800 ml   Filed Weights   08/08/20 1430  Weight: 65.3 kg    Examination:  General exam: Appears calm and comfortable. Alert oriented x 2 Respiratory system: Clear to auscultation. Respiratory effort normal. Cardiovascular system: S1 & S2 heard, RRR. No JVD, murmurs, rubs, gallops or clicks.  No pedal edema. Gastrointestinal system: Abdomen is nondistended, soft and nontender. No organomegaly or masses felt. Normal bowel sounds heard. Central nervous system: Alert and oriented x 2, not able to move extremities. Extremities: No clubbing, no cyanosis, edema ++ noted. Skin: sacral 2 bed sores. No rashes, lesions or ulcers. Psychiatry: Judgement and insight appear normal. Mood & affect appropriate.     Data Reviewed: I have personally reviewed following labs and imaging studies  CBC: Recent Labs  Lab 08/08/20 1247 08/09/20 0449  WBC 10.3 11.0*  NEUTROABS 8.8*  --   HGB 9.5* 9.9*  HCT 30.3* 31.4*  MCV 85.6 83.3  PLT 113* 619*   Basic Metabolic Panel: Recent Labs  Lab 08/08/20 1247 08/09/20 0449  NA 141 142  K 3.7 3.5  CL 111 111  CO2 25 24  GLUCOSE 62* 108*  BUN 23* 17  CREATININE <0.30* <0.30*  CALCIUM 7.7* 8.4*   GFR: CrCl cannot be calculated (This lab value cannot be used to calculate CrCl because it is not a number: <0.30). Liver Function Tests: Recent Labs  Lab 08/08/20 1247  AST 163*  ALT 233*  ALKPHOS 102  BILITOT 0.3  PROT 4.4*  ALBUMIN 1.7*   No results for input(s): LIPASE, AMYLASE in the last 168 hours. No results for input(s): AMMONIA in the last 168 hours. Coagulation Profile: Recent Labs  Lab 08/08/20 1403  INR 1.0   Cardiac Enzymes: Recent Labs  Lab 08/08/20 1320  CKTOTAL 30*   BNP (last 3 results) No results for input(s): PROBNP in the last 8760 hours. HbA1C: No results for input(s): HGBA1C in the last 72 hours. CBG: Recent Labs  Lab 08/08/20 1544  GLUCAP 122*   Lipid Profile: No  results for input(s): CHOL, HDL, LDLCALC, TRIG, CHOLHDL, LDLDIRECT in the last 72 hours. Thyroid Function Tests: No results for input(s): TSH, T4TOTAL, FREET4, T3FREE, THYROIDAB in the last 72 hours. Anemia Panel: No results for input(s): VITAMINB12, FOLATE, FERRITIN, TIBC, IRON, RETICCTPCT in the last 72 hours. Sepsis Labs: Recent Labs  Lab 08/08/20 1247 08/08/20 1403  LATICACIDVEN 0.8 0.6    Recent Results (from the past 240 hour(s))  Urine culture     Status: None   Collection Time: 08/08/20 12:08 PM   Specimen: In/Out Cath Urine  Result Value Ref Range Status   Specimen Description   Final    IN/OUT CATH URINE Performed at  [facility], [address] [place], [place] [address]    Special Requests   Final    NONE Performed at [facility], [address], [place], [place] [address]    Culture   Final    NO GROWTH Performed at [facility], [place] [address], [place], [place] [address]    Report Status 08/09/2020 FINAL  Final  SARS Coronavirus 2 by RT PCR (hospital order, performed in [facility] hospital lab) Nasopharyngeal Nasopharyngeal Swab     Status: None   Collection Time: 08/08/20 12:37 PM   Specimen: Nasopharyngeal Swab  Result Value Ref Range Status   SARS Coronavirus 2 NEGATIVE NEGATIVE Final    Comment: (NOTE) SARS-CoV-2 target nucleic acids are NOT DETECTED.  The SARS-CoV-2 RNA is generally detectable in upper and lower respiratory specimens during the acute phase of infection. The lowest concentration of SARS-CoV-2 viral copies this assay can detect is 250 copies / mL. A negative result does not preclude SARS-CoV-2 infection and should not be used as the sole basis for treatment or other patient management decisions.  A negative result may occur with improper specimen collection / handling, submission of specimen other than nasopharyngeal swab, presence of viral mutation(s) within the areas targeted by this assay, and inadequate number of viral  copies (<250 copies / mL). A negative result must be combined with clinical observations, patient history, and epidemiological information.  Fact Sheet for Patients:   StrictlyIdeas.no  Fact Sheet for Healthcare Providers: BankingDealers.co.za  This test is not yet approved or  cleared by the [place] FDA and has been authorized for detection and/or diagnosis of SARS-CoV-2 by FDA under an Emergency Use Authorization (EUA).  This EUA will remain in effect (meaning this test can be used) for the duration of the COVID-19 declaration under Section 564(b)(1) of the Act, 21 U.S.C. section 360bbb-3(b)(1), unless the authorization is terminated or revoked sooner.  Performed at [facility], [address] [place], [place] [address]   Blood Culture (routine x 2)     Status: None (Preliminary result)   Collection Time: 08/08/20 12:47 PM   Specimen: BLOOD RIGHT HAND  Result Value Ref Range Status   Specimen Description   Final    BLOOD RIGHT HAND BOTTLES DRAWN AEROBIC AND ANAEROBIC   Special Requests   Final    Blood Culture results may not be optimal due to an inadequate volume of blood received in culture bottles   Culture   Final    NO GROWTH 2 DAYS Performed at [facility], [address], [place], [place] [address]    Report Status PENDING  Incomplete  Blood Culture (routine x 2)     Status: None (Preliminary result)   Collection Time: 08/08/20  1:20 PM   Specimen: BLOOD RIGHT FOREARM  Result Value Ref Range Status   Specimen Description   Final    BLOOD RIGHT FOREARM BOTTLES DRAWN AEROBIC AND ANAEROBIC   Special Requests   Final    Blood Culture results may not be optimal due to an excessive volume of blood received in culture bottles   Culture   Final    NO GROWTH 2 DAYS Performed at [facility], [address] [place], [place] [address]    Report Status PENDING  Incomplete     Radiology Studies: EEG adult  Result  Date: 08/09/2020 Lora Havens, MD     08/09/2020 11:38 AM Patient Name: JERMY COUPER [name]: [id number] Epilepsy Attending: Lora Havens  Referring Physician/Provider: Dr Sander Radon Date: 08/09/2020 Duration: 26.05 mins Patient history: 44 year old male with functional quadriparesis, hearing and visual loss with history of neurofibromatosis type II and seizures with prior event of status epilepticus who presents with altered mentation.  24 hours ago he was noted to have a generalized seizure that recovered by itself. EEG to evaluate for seizure. Level of alertness: Awake, asleep AEDs during EEG study: Keppra Technical aspects: This EEG study was done with scalp electrodes positioned according to the 10-20 International system of electrode placement. Electrical activity was acquired at a sampling rate of 500Hz  and reviewed with a high frequency filter of 70Hz  and a low frequency filter of 1Hz . EEG data were recorded continuously and digitally stored. Description: No posterior dominant rhythm was seen. Sleep was characterized by vertex waves, maximal frontocentral region. EEG showed continuous generalized 3 to 6 Hz theta-delta slowing. Hyperventilation and photic stimulation were not performed.   ABNORMALITY -Continuous slow, generalized IMPRESSION: This study is suggestive of moderate diffuse encephalopathy, nonspecific etiology. No seizures or epileptiform discharges were seen throughout the recording. Lora Havens   US Abdomen Limited RUQ  Result Date: 08/09/2020 CLINICAL DATA:  Transaminasemia EXAM: ULTRASOUND ABDOMEN LIMITED RIGHT UPPER QUADRANT COMPARISON:  None. FINDINGS: Limited patient mobility limits evaluation. Gallbladder: No gallstones or wall thickening visualized. Incidental 2.7 mm polyp. No sonographic Murphy sign noted by sonographer. Common bile duct: Diameter: 4.2 mm Liver: No focal lesion identified. Increased parenchymal echogenicity. Portal vein is patent on color Doppler imaging  with normal direction of blood flow towards the liver. Other: None. IMPRESSION: Hepatic steatosis.  No focal hepatic lesion. Incidental 2.7 mm polyp. Otherwise unremarkable appearance of the gallbladder and bile ducts. Electronically Signed   By: Primitivo Gauze M.D.   On: 08/09/2020 11:20    Scheduled Meds: . Chlorhexidine Gluconate Cloth  6 each Topical Daily  . collagenase   Topical BID  . enoxaparin (LOVENOX) injection  40 mg Subcutaneous Q24H  . hydrocortisone sod succinate (SOLU-CORTEF) inj  100 mg Intravenous Q8H   Continuous Infusions: . ceFEPime (MAXIPIME) IV 2 g (08/10/20 1515)  . dextrose 5% lactated ringers 100 mL/hr at 08/10/20 1513  . levETIRAcetam 1,500 mg (08/10/20 0931)  . metronidazole 500 mg (08/10/20 1121)  . vancomycin 1,000 mg (08/10/20 1323)     LOS: 2 days    Time spent: 35 mins.    Shawna Clamp, MD [facility]   If 7PM-7AM, please contact night-coverage

## 2020-08-10 NOTE — Plan of Care (Signed)
  Problem: Education: Goal: Knowledge of General Education information will improve Description: Including pain rating scale, medication(s)/side effects and non-pharmacologic comfort measures Outcome: Progressing   Problem: Clinical Measurements: Goal: Will remain free from infection Outcome: Progressing  Discussed antibiotics and need for IV access extensively with patient. And family.  IV midline placed.  Patient afebrile this shift.  Problem: Clinical Measurements: Goal: Cardiovascular complication will be avoided Outcome: Progressing  Patient with low HR this shift, improving from 30's to 40's this shift. Patient remaining alert and conversant. MD notified, orders received.

## 2020-08-10 NOTE — Progress Notes (Signed)
Discussed low heart rate with Dr. Dwyane Dee. Reviewed current and PTA meds. No new orders at this time.

## 2020-08-10 NOTE — Progress Notes (Signed)
Attempt to place midline left arm x 3 unsuccessful.  Gain access to left basilic and left brachial veins but could not thread.  Floor RN notified and to contact MD

## 2020-08-10 NOTE — Consult Note (Signed)
[place] Nurse Consult Note: Patient care given in room [facility] [phone] Reason for Consult: Sacral wound Wound type: Unstageable PI Pressure Injury POA: Yes Measurement: 4 cm x 2.2 cm, evolving wound Wound bed: Involves left and right sides of the sacrum at the apex of the gluteal fold. Left area has yellow slough. Everything else is discolored grey and purple and is beginning to break down.  Drainage (amount, consistency, odor) None Periwound: Fragile and moist  Dressing procedure/placement/frequency: Apply Santyl to sacral wound in a nickel thick layer. Cover with a saline moistened gauze, then dry gauze or ABD pad.  Change twice daily.  Additional care and instructions include bilateral Prevalon boots, mattress with low air loss feature and turning instructions for staff.   Monitor the wound area(s) for worsening of condition such as: Signs/symptoms of infection,  Increase in size,  Development of or worsening of odor, Development of pain, or increased pain at the affected locations.   Notify the medical team if any of these develop.  Thank you for the consult.  Discussed plan of care with the patient and bedside nurse.  [place] nurse will not follow at this time.   Please re-consult the [place] team if needed.  Cathlean Marseilles Tamala Julian, MSN, RN, [place], Lysle Pearl, [facility] Wound Treatment Associate Office [phone]  Pager [phone]

## 2020-08-10 NOTE — Consult Note (Signed)
Cardiology Consultation:   Patient ID: DAYLYN AZBILL [name]: [id number]; DOB: Nov 11, 1976  Admit date: 08/08/2020 Date of Consult: 08/10/2020  Primary Care Provider: Alroy Dust, L.Marlou Sa, MD [place] Cardiologist: Pixie Casino, MD (new) [facility] Electrophysiologist:  None    Patient Profile:   COADY TRAIN is a 44 y.o. male with a hx of neurofibromatosis type II, recurrent meningiomas, vestibular schwannoma, quadriplegia/hearing loss/vision loss/seizure disorder following neurosurgery in 01/2020, prior partial nephrectomy, pressure ulcer who is being seen today for the evaluation of bradycardia at the request of Dr. Dwyane Dee.  History of Present Illness:   History obtained by chart review as the patient is unable to provide adequate history. The patient has been managed at [facility] for the above diagnoses and underwent neurosurgery intervention in March 2021. Per outpatient palliative notes following for goals of care, "prior to surgery in March 2021, patient was able to drive, do laundry, cook and assist his wife who is an Environmental consultant principal." Post surgery he apparently developed functional quadriplegia, blindness and hearing loss. He spent about 5 months in the hospital and also had seizure disorder. He has been living under the care of his mother and father needing assistance with basic ADLs and had a stage II decubitus pressure ulcer. Chart indicates he'd been receiving treatment with brigatinib as outpatient as part of clinical trial. He was also recently diagnosed with UTI with treatment. He presented to the hospital with blank staring and decreased responsiveness. His family was in contact with neurology with additional dosing of Keppra provided. However, on 08/08/2020 he was very somnolent, lethargic and unresponsive with hypotension 29mmHg. EMS was called with intent for transfer to [facility] but en route he developed persistent hypotension so was brought to [facility]. CT head showed  advanced meningiomatosis, unchanged vasogenic edema, no acute bleeding. CXR showed signs of aspiration PNA. He also had low grade fever on arrival and concern for sepsis as well as acute respiratory failure with hypoxia. He has been followed by the medical team and neurology. The suspicion is that his AMS was due to hypotension, and blood pressures have improved. He has been treated with antibiotics and Solu-Cortef. Labs otherwise show low-normal potassium, hypocalcemia that corrects to normal for severe hypoalbuminemia, elevated AST/ALT, anemia with Hgb 9.9, mild thrombocytopenia. He was on low dose propranolol 10mg  BID as outpatient reportedly for tremor but this was not ordered since admission due to hypotension (admitted >48 hours).  Over the last day he has regained some functionality at speaking in short phrases and tracking with his eyes. Although his blood pressure has been improving he has been noted to become progressively more bradycardic today in the upper 30s-mid 40s. He does not seem acutely symptomatic with this, fortunately. He is seen at bedside currently getting a midline catheter placed. He is able to answer simple questions with slow cadence of speech. He reports some left arm discomfort where the IV team is working but otherwise denies any dizziness, chest pain or SOB. He appears comfortable.   Past Medical History:  Diagnosis Date  . Hard of hearing   . NF2 (neurofibromatosis 2) ([place])   . Pressure ulcer   . Quadriplegia ([place])   . Seizures ([place])   . Vestibular schwannoma [facility])     Past Surgical History:  Procedure Laterality Date  . brain bleed     mri 2009  . HAND EXPLORATION    . LUMBAR FUSION    . NEPHRECTOMY     Partial left nephrectomy   .  SPINAL FUSION       Home Medications:  Prior to Admission medications   Medication Sig Start Date End Date Taking? Authorizing Provider  acetaminophen (TYLENOL) 325 MG tablet Take 2 tablets (650 mg total) by mouth every 6  (six) hours as needed for mild pain or fever. 06/05/20  Yes Angiulli, Lavon Paganini, PA-C  baclofen (LIORESAL) 10 MG tablet Take 1 tablet (10 mg total) by mouth 2 (two) times daily. 06/14/20  Yes Lovorn, Megan, MD  brigatinib (ALUNBRIG) 90 & 180 MG TBPK Take 90-180 mg by mouth daily.  06/07/20  Yes [provider]  carboxymethylcellulose (REFRESH TEARS) 0.5 % SOLN Place 1 drop into both eyes daily as needed (For dry eyes).   Yes [provider]  ciprofloxacin (CIPRO) 500 MG tablet Take 500 mg by mouth 2 (two) times daily.  08/01/20  Yes [provider]  dexamethasone (DECADRON) 2 MG tablet Take 2 mg by mouth daily.  06/21/20  Yes [provider]  famotidine (PEPCID) 20 MG tablet Take 1 tablet (20 mg total) by mouth 2 (two) times daily. 07/21/20  Yes Lovorn, Jinny Blossom, MD  levETIRAcetam (KEPPRA) 500 MG tablet Take 3 tablets (1,500 mg total) by mouth 2 (two) times daily. 06/05/20 08/08/20 Yes Angiulli, Lavon Paganini, PA-C  propranolol (INDERAL) 10 MG tablet Take 1 tablet (10 mg total) by mouth 2 (two) times daily. 06/05/20  Yes Angiulli, Lavon Paganini, PA-C  ammonium lactate (AMLACTIN) 12 % lotion Apply 1 application topically as needed for dry skin. Patient not taking: Reported on 08/08/2020 06/28/20   Felipa Furnace, DPM  bisacodyl (DULCOLAX) 10 MG suppository Place 1 suppository (10 mg total) rectally daily at 6 PM. Patient not taking: Reported on 08/08/2020 06/05/20   Cathlyn Parsons, PA-C    Inpatient Medications: Scheduled Meds: . Chlorhexidine Gluconate Cloth  6 each Topical Daily  . collagenase   Topical BID  . enoxaparin (LOVENOX) injection  40 mg Subcutaneous Q24H  . hydrocortisone sod succinate (SOLU-CORTEF) inj  100 mg Intravenous Q8H   Continuous Infusions: . ceFEPime (MAXIPIME) IV 2 g (08/10/20 1515)  . dextrose 5% lactated ringers 100 mL/hr at 08/10/20 1513  . levETIRAcetam 1,500 mg (08/10/20 0931)  . metronidazole 500 mg (08/10/20 1121)  . vancomycin 1,000 mg (08/10/20  1323)   PRN Meds: LORazepam, ondansetron **OR** ondansetron (ZOFRAN) IV  Allergies:    Allergies  Allergen Reactions  . Amoxicillin Itching  . Morphine Itching    Social History:   Social History   Socioeconomic History  . Marital status: Married    Spouse name: Not on file  . Number of children: Not on file  . Years of education: Not on file  . Highest education level: Not on file  Occupational History  . Not on file  Tobacco Use  . Smoking status: Never Smoker  . Smokeless tobacco: Never Used  Vaping Use  . Vaping Use: Never used  Substance and Sexual Activity  . Alcohol use: Not Currently    Alcohol/week: 0.0 standard drinks  . Drug use: Never  . Sexual activity: Not Currently  Other Topics Concern  . Not on file  Social History Narrative  . Not on file   Social Determinants of Health   Financial Resource Strain:   . Difficulty of Paying Living Expenses: Not on file  Food Insecurity:   . Worried About Charity fundraiser in the Last Year: Not on file  . Ran Out of Food in the Last Year:  Not on file  Transportation Needs:   . Lack of Transportation (Medical): Not on file  . Lack of Transportation (Non-Medical): Not on file  Physical Activity:   . Days of Exercise per Week: Not on file  . Minutes of Exercise per Session: Not on file  Stress:   . Feeling of Stress : Not on file  Social Connections:   . Frequency of Communication with Friends and Family: Not on file  . Frequency of Social Gatherings with Friends and Family: Not on file  . Attends Religious Services: Not on file  . Active Member of Clubs or Organizations: Not on file  . Attends Archivist Meetings: Not on file  . Marital Status: Not on file  Intimate Partner Violence:   . Fear of Current or Ex-Partner: Not on file  . Emotionally Abused: Not on file  . Physically Abused: Not on file  . Sexually Abused: Not on file    Family History:   Patient unsure, unable to provide  ROS:    Please see the history of present illness.  All other ROS reviewed and negative.     Physical Exam/Data:   Vitals:   08/10/20 1137 08/10/20 1338 08/10/20 1526 08/10/20 1544  BP: (!) 147/97 (!) 132/107 (!) 154/125 (!) 151/90  Pulse: (!) 45  (!) 39 (!) 41  Resp: 18 18 16 18   Temp: 97.9 F (36.6 C)     TempSrc:      SpO2: 99% 100% 100% 99%  Weight:        Intake/Output Summary (Last 24 hours) at 08/10/2020 1656 Last data filed at 08/10/2020 1139 Gross per 24 hour  Intake --  Output 800 ml  Net -800 ml   Last 3 Weights 08/08/2020 06/14/2020 05/30/2020  Weight (lbs) 144 lb (No Data) 144 lb 10 oz  Weight (kg) 65.318 kg (No Data) 65.6 kg     Body mass index is 20.66 kg/m (pended).  Vital Signs. BP (!) 151/90 (BP Location: Left Arm)   Pulse (!) 41   Temp 97.9 F (36.6 C)   Resp 18   Wt 65.3 kg   SpO2 99%   BMI (P) 20.66 kg/m  Examination limited due to ongoing IV procedure General: Well developed alert WM in NAD Head: Sclera non-icteric, no xanthomas, nares are without discharge, divergence of eyes noted at times. Neck: Supple Lungs: Deferred due to draping Heart: Deferred due to draping Abdomen: Deferred due to draping Extremities: Pale, no overt edema, upper extremity contractures noted Neuro: Alert, responds to questions with brief answers and slowed cadence of speech Psych: Flat affect No family presently at bedside  EKG:  The EKG was personally reviewed and demonstrates: baseline artifact present but appears to show sinus bradycardia with sinus arrhythmia, narrow complex, 39bpm, nonspecific TW changes Telemetry:  Telemetry was personally reviewed and demonstrates:  Sinus bradycardia HR previously 60s-80s but more recently today upper 30s-mid 40s, one brief 2.2 sec pause earlier c/w Mobitz 1  Relevant CV Studies: None available  Laboratory Data:  High Sensitivity Troponin:  No results for input(s): TROPONINIHS in the last 720 hours.   Chemistry Recent Labs  Lab  08/08/20 1247 08/09/20 0449  NA 141 142  K 3.7 3.5  CL 111 111  CO2 25 24  GLUCOSE 62* 108*  BUN 23* 17  CREATININE <0.30* <0.30*  CALCIUM 7.7* 8.4*  GFRNONAA NOT CALCULATED NOT CALCULATED  GFRAA NOT CALCULATED NOT CALCULATED  ANIONGAP 5 7    Recent Labs  Lab 08/08/20 1247  PROT 4.4*  ALBUMIN 1.7*  AST 163*  ALT 233*  ALKPHOS 102  BILITOT 0.3   Hematology Recent Labs  Lab 08/08/20 1247 08/09/20 0449  WBC 10.3 11.0*  RBC 3.54* 3.77*  HGB 9.5* 9.9*  HCT 30.3* 31.4*  MCV 85.6 83.3  MCH 26.8 26.3  MCHC 31.4 31.5  RDW 17.5* 17.4*  PLT 113* 144*   BNPNo results for input(s): BNP, PROBNP in the last 168 hours.  DDimer No results for input(s): DDIMER in the last 168 hours.   Radiology/Studies:  CT Head Wo Contrast  Result Date: 08/08/2020 CLINICAL DATA:  Cerebral hemorrhage suspected. Additional history provided: History of NF 2, history of seizures, last seizure yesterday, decreased full of consciousness, low blood pressure. EXAM: CT HEAD WITHOUT CONTRAST TECHNIQUE: Contiguous axial images were obtained from the base of the skull through the vertex without intravenous contrast. COMPARISON:  Prior brain MRI 05/24/2020.  Prior head CT 09/10/2017. FINDINGS: Brain: Known extensive meningiomatosis was more fully characterized on the brain MRI of 05/24/2020. Most notably, there is a dominant parafalcine meningioma as well as extensive en plaque meningiomas greatest overlying the anterior and superior cerebral convexities. Numerous additional smaller supratentorial and infratentorial meningiomas were better appreciated on the prior MRI. A right vestibular schwannoma was also better appreciated on this prior study. There is limited assessment for interval change in size of these masses due to noncontrast technique on the current study and differences in modality. Prominent vasogenic edema within the bilateral mid to anterior frontal lobes and crossing the corpus callosum does not  appear significantly changed. Redemonstrated small nonspecific scattered foci of parenchymal calcification. Questionable asymmetric enlargement of the right hippocampus was better appreciated on the prior MRI. As before, there is partial herniation of the posterior rectus gyri into the sella turcica. There is no acute intracranial hemorrhage. No demarcated cortical infarct is identified. No extra-axial fluid collection. No midline shift. Vascular: No hyperdense vessel.  Atherosclerotic calcifications. Skull: Sequela of prior bifrontal parietal craniotomy/cranioplasty. Sinuses/Orbits: Redemonstrated small foci of calcification along the right greater than left intraorbital optic nerve sheaths suspicious for optic nerve sheath meningiomas. Mild right frontal sinus mucosal thickening. 16 mm osteoma within the left frontoethmoidal recess. No significant mastoid effusion. IMPRESSION: No evidence of acute intracranial hemorrhage or acute demarcated cortical infarct. Extensive meningiomatosis more fully characterized on the recent prior brain MRI of 05/24/2020. Most notably, this includes a large parafalcine meningioma as well as extensive en plaque meningiomas greatest along the anterior and superior cerebral convexities. Unchanged prominent vasogenic edema within the underlying mid-to-anterior frontal lobes and crossing the corpus callosum. As before, there is herniation of the rectus gyri into the sella turcica. 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 A known right vestibular schwannoma was better appreciated on the prior MRI. Questionable asymmetric enlargement of the right hippocampus was also better appreciated on the prior MRI. Electronically Signed   By: Kellie Simmering DO   On: 08/08/2020 14:07   DG Chest Port 1 View  Result Date: 08/08/2020 CLINICAL DATA:  Questionable sepsis.  COVID  test pending. EXAM: PORTABLE CHEST 1 VIEW COMPARISON:  None. FINDINGS: Multifocal airspace opacities in the left lung. No pleural effusions. No discernible pneumothorax. Spinal stabilization hardware, partially imaged. Polyarticular degenerative change without evidence of acute fracture. IMPRESSION: Multifocal airspace opacities in the left lung, concerning for pneumonia. Recommend follow-up to resolution. Electronically Signed   By:  Margaretha Sheffield MD   On: 08/08/2020 13:12   EEG adult  Result Date: 08/09/2020 Lora Havens, MD     08/09/2020 11:38 AM Patient Name: HERVE HAUG [name]: [id number] Epilepsy Attending: Lora Havens Referring Physician/Provider: Dr Sander Radon Date: 08/09/2020 Duration: 26.05 mins Patient history: 44 year old male with functional quadriparesis, hearing and visual loss with history of neurofibromatosis type II and seizures with prior event of status epilepticus who presents with altered mentation.  24 hours ago he was noted to have a generalized seizure that recovered by itself. EEG to evaluate for seizure. Level of alertness: Awake, asleep AEDs during EEG study: Keppra Technical aspects: This EEG study was done with scalp electrodes positioned according to the 10-20 International system of electrode placement. Electrical activity was acquired at a sampling rate of 500Hz  and reviewed with a high frequency filter of 70Hz  and a low frequency filter of 1Hz . EEG data were recorded continuously and digitally stored. Description: No posterior dominant rhythm was seen. Sleep was characterized by vertex waves, maximal frontocentral region. EEG showed continuous generalized 3 to 6 Hz theta-delta slowing. Hyperventilation and photic stimulation were not performed.   ABNORMALITY -Continuous slow, generalized IMPRESSION: This study is suggestive of moderate diffuse encephalopathy, nonspecific etiology. No seizures or epileptiform discharges were seen throughout the recording. Lora Havens   US Abdomen Limited RUQ  Result Date: 08/09/2020 CLINICAL DATA:  Transaminasemia EXAM: ULTRASOUND ABDOMEN LIMITED RIGHT UPPER QUADRANT COMPARISON:  None. FINDINGS: Limited patient mobility limits evaluation. Gallbladder: No gallstones or wall thickening visualized. Incidental 2.7 mm polyp. No sonographic Murphy sign noted by sonographer. Common bile duct: Diameter: 4.2 mm Liver: No focal lesion identified. Increased parenchymal echogenicity. Portal vein is patent on color Doppler imaging with normal direction of blood flow towards the liver. Other: None. IMPRESSION: Hepatic steatosis.  No focal hepatic lesion. Incidental 2.7 mm polyp. Otherwise unremarkable appearance of the gallbladder and bile ducts. Electronically Signed   By: Primitivo Gauze M.D.   On: 08/09/2020 11:20    Assessment and Plan:   1. Altered mental status in the context of hypotension/sepsis superimposed on complex medical illness with neurofibromatosis and quadriplegia - per primary team - bradycardia developed after his mental status improved so does not appear that rhythm is specifically related to his initial presentation when HR was more normal  2. Sinus bradycardia, also with Mobitz 1 block - no precipitating meds on board although patient has been reported to be treated with brigatinib prior to hospitalization, unclear when. This medicine does have adverse effect of bradycardia but not clear to me the timeframe when this occurred in patients in the trials  - will formally review with cardiologist, but did discuss with Dr. Lovena Le with EP - given lack of acute symptoms, preserved blood pressure, narrow QRS and debilitated/infected functional status, he would recommend conservative management without any procedural interventions and recommend treating underlying medical issues  - update thyroid function  3. Transaminitis, anemia, hypoalbuminemia - per medical team  For questions or updates, please contact [place] Please consult [email or web address] for contact info under    Signed, Charlie Pitter, PA-C  08/10/2020 4:56 PM

## 2020-08-10 NOTE — Progress Notes (Signed)
08/10/20 1526 08/10/20 1544  Assess: MEWS Score  Temp  --   (unable to get temp)  BP (!) 154/125 (!) 151/90  Pulse Rate (!) 39 (!) 41  ECG Heart Rate (!) 39  --   Resp 16 18  SpO2 100 % 99 %  O2 Device Nasal Cannula Nasal Cannula  O2 Flow Rate (L/min) 3 L/min  --   Assess: MEWS Score  MEWS Temp 0 0  MEWS Systolic 0 0  MEWS Pulse 2 1  MEWS RR 0 0  MEWS LOC 0 0  MEWS Score 2 1  MEWS Score Color Yellow Green  Assess: if the MEWS score is Yellow or Red  Were vital signs taken at a resting state? Yes  --   Focused Assessment No change from prior assessment  --   Early Detection of Sepsis Score *See Row Information* Low  --   MEWS guidelines implemented *See Row Information* Yes  --   Treat  MEWS Interventions Administered scheduled meds/treatments;Escalated (See documentation below)  --   Pain Scale FLACC  --   Pain Score 0  --   Take Vital Signs  Increase Vital Sign Frequency  Yellow: Q 2hr X 2 then Q 4hr X 2, if remains yellow, continue Q 4hrs  --   Escalate  MEWS: Escalate Yellow: discuss with charge nurse/RN and consider discussing with provider and RRT  --   Notify: Charge Nurse/RN  Name of Charge Nurse/RN Notified [place]  --   Date Charge Nurse/RN Notified 08/10/20  --   Time Charge Nurse/RN Notified 1526  --   Notify: Provider  Provider Name/Title Dwyane Dee  --   Date Provider Notified 08/10/20  --   Time Provider Notified 1526  --   Notification Type Page  --   Notification Reason Change in status  --   Response See new orders  --   Date of Provider Response 08/10/20  --   Time of Provider Response 1526  --   Document  Patient Outcome Stabilized after interventions  --   Progress note created (see row info) Yes  --      08/10/20 1526 08/10/20 1544  Assess: MEWS Score  Temp  --   (unable to get temp)  BP (!) 154/125 (!) 151/90  Pulse Rate (!) 39 (!) 41  ECG Heart Rate (!) 39  --   Resp 16 18  SpO2 100 % 99 %  O2 Device Nasal Cannula Nasal Cannula  O2  Flow Rate (L/min) 3 L/min  --   Assess: MEWS Score  MEWS Temp 0 0  MEWS Systolic 0 0  MEWS Pulse 2 1  MEWS RR 0 0  MEWS LOC 0 0  MEWS Score 2 1  MEWS Score Color Yellow Green  Assess: if the MEWS score is Yellow or Red  Were vital signs taken at a resting state? Yes  --   Focused Assessment No change from prior assessment  --   Early Detection of Sepsis Score *See Row Information* Low  --   MEWS guidelines implemented *See Row Information* Yes  --   Treat  MEWS Interventions Administered scheduled meds/treatments;Escalated (See documentation below)  --   Pain Scale FLACC  --   Pain Score 0  --   Take Vital Signs  Increase Vital Sign Frequency  Yellow: Q 2hr X 2 then Q 4hr X 2, if remains yellow, continue Q 4hrs  --   Escalate  MEWS: Escalate Yellow: discuss with  charge nurse/RN and consider discussing with provider and RRT  --   Notify: Charge Nurse/RN  Name of Charge Nurse/RN Notified [place]  --   Date Charge Nurse/RN Notified 08/10/20  --   Time Charge Nurse/RN Notified 1526  --   Notify: Provider  Provider Name/Title Dwyane Dee  --   Date Provider Notified 08/10/20  --   Time Provider Notified 1526  --   Notification Type Page  --   Notification Reason Change in status  --   Response See new orders  --   Date of Provider Response 08/10/20  --   Time of Provider Response 1526  --   Document  Patient Outcome Stabilized after interventions  --   Progress note created (see row info) Yes  --     Charge RN notifying RN that patient with HR 30-40's all morning. Central Tele reporting this is patient's baseline, however, on review of chart, HR of 70-90 documented in previous days. Confirmed with tele that patient is in sinus rhythm, palpated pulse to verify HR of 39 and notified MD. Orders rec'd for cardiology consult and stat EKG. Patient remaining alert and conversant.

## 2020-08-11 ENCOUNTER — Inpatient Hospital Stay: Payer: Self-pay

## 2020-08-11 ENCOUNTER — Inpatient Hospital Stay (HOSPITAL_COMMUNITY): Payer: Medicare Other

## 2020-08-11 LAB — COMPREHENSIVE METABOLIC PANEL
ALT: 122 U/L — ABNORMAL HIGH (ref 0–44)
ALT: 112 U/L — ABNORMAL HIGH (ref 0–44)
AST: 66 U/L — ABNORMAL HIGH (ref 15–41)
AST: 40 U/L (ref 15–41)
Albumin: 1.4 g/dL — ABNORMAL LOW (ref 3.5–5.0)
Albumin: 1.4 g/dL — ABNORMAL LOW (ref 3.5–5.0)
Alkaline Phosphatase: 89 U/L (ref 38–126)
Alkaline Phosphatase: 93 U/L (ref 38–126)
Anion gap: 6 (ref 5–15)
Anion gap: 7 (ref 5–15)
BUN: 12 mg/dL (ref 6–20)
BUN: 12 mg/dL (ref 6–20)
CO2: 22 mmol/L (ref 22–32)
CO2: 22 mmol/L (ref 22–32)
Calcium: 8.4 mg/dL — ABNORMAL LOW (ref 8.9–10.3)
Calcium: 8.4 mg/dL — ABNORMAL LOW (ref 8.9–10.3)
Chloride: 113 mmol/L — ABNORMAL HIGH (ref 98–111)
Chloride: 115 mmol/L — ABNORMAL HIGH (ref 98–111)
Creatinine, Ser: <0.3 mg/dL — ABNORMAL LOW (ref 0.61–1.24)
Creatinine, Ser: <0.3 mg/dL — ABNORMAL LOW (ref 0.61–1.24)
Glucose, Bld: 184 mg/dL — ABNORMAL HIGH (ref 70–99)
Glucose, Bld: 121 mg/dL — ABNORMAL HIGH (ref 70–99)
Potassium: 2.7 mmol/L — CL (ref 3.5–5.1)
Potassium: 4.3 mmol/L (ref 3.5–5.1)
Sodium: 141 mmol/L (ref 135–145)
Sodium: 144 mmol/L (ref 135–145)
Total Bilirubin: 0.4 mg/dL (ref 0.3–1.2)
Total Bilirubin: 0.5 mg/dL (ref 0.3–1.2)
Total Protein: 4.3 g/dL — ABNORMAL LOW (ref 6.5–8.1)
Total Protein: 4.3 g/dL — ABNORMAL LOW (ref 6.5–8.1)

## 2020-08-11 LAB — LACTIC ACID, PLASMA: Lactic Acid, Venous: 1.7 mmol/L (ref 0.5–1.9)

## 2020-08-11 LAB — CBC
HCT: 33.5 % — ABNORMAL LOW (ref 39.0–52.0)
HCT: 32.9 % — ABNORMAL LOW (ref 39.0–52.0)
Hemoglobin: 10.7 g/dL — ABNORMAL LOW (ref 13.0–17.0)
Hemoglobin: 10.7 g/dL — ABNORMAL LOW (ref 13.0–17.0)
MCH: 26.2 pg (ref 26.0–34.0)
MCH: 25.7 pg — ABNORMAL LOW (ref 26.0–34.0)
MCHC: 31.9 g/dL (ref 30.0–36.0)
MCHC: 32.5 g/dL (ref 30.0–36.0)
MCV: 82.1 fL (ref 80.0–100.0)
MCV: 79.1 fL — ABNORMAL LOW (ref 80.0–100.0)
Platelets: 83 10*3/uL — ABNORMAL LOW (ref 150–400)
Platelets: 119 10*3/uL — ABNORMAL LOW (ref 150–400)
RBC: 4.08 MIL/uL — ABNORMAL LOW (ref 4.22–5.81)
RBC: 4.16 MIL/uL — ABNORMAL LOW (ref 4.22–5.81)
RDW: 17.6 % — ABNORMAL HIGH (ref 11.5–15.5)
RDW: 17.2 % — ABNORMAL HIGH (ref 11.5–15.5)
WBC: 8.6 10*3/uL (ref 4.0–10.5)
WBC: 10 10*3/uL (ref 4.0–10.5)
nRBC: 0 % (ref 0.0–0.2)
nRBC: 0.4 % — ABNORMAL HIGH (ref 0.0–0.2)

## 2020-08-11 LAB — MAGNESIUM
Magnesium: 1.8 mg/dL (ref 1.7–2.4)
Magnesium: 1.6 mg/dL — ABNORMAL LOW (ref 1.7–2.4)

## 2020-08-11 LAB — PHOSPHORUS: Phosphorus: 2.4 mg/dL — ABNORMAL LOW (ref 2.5–4.6)

## 2020-08-11 LAB — MRSA PCR SCREENING: MRSA by PCR: NEGATIVE

## 2020-08-11 MED ORDER — SODIUM CHLORIDE 0.9% FLUSH
10.0000 mL | Freq: Two times a day (BID) | INTRAVENOUS | Status: DC
  Administered 2020-08-11 – 2020-08-17 (×10): 10 mL
  Administered 2020-08-18: 20 mL
  Administered 2020-08-18 – 2020-08-29 (×23): 10 mL
  Administered 2020-08-30: 20 mL
  Administered 2020-08-30 – 2020-08-31 (×2): 10 mL
  Administered 2020-08-31: 20 mL
  Administered 2020-09-01 – 2020-09-02 (×4): 10 mL
  Administered 2020-09-03: 30 mL
  Administered 2020-09-03 – 2020-09-05 (×4): 10 mL

## 2020-08-11 MED ORDER — ARTIFICIAL TEARS OPHTHALMIC OINT
1.0000 "application " | TOPICAL_OINTMENT | OPHTHALMIC | Status: DC | PRN
  Administered 2020-08-13 – 2020-08-28 (×3): 1 via OPHTHALMIC
  Filled 2020-08-11 (×3): qty 3.5

## 2020-08-11 MED ORDER — POTASSIUM CHLORIDE 10 MEQ/100ML IV SOLN
10.0000 meq | INTRAVENOUS | Status: AC
  Administered 2020-08-11 (×2): 10 meq via INTRAVENOUS
  Filled 2020-08-11 (×3): qty 100

## 2020-08-11 MED ORDER — POTASSIUM PHOSPHATES 15 MMOLE/5ML IV SOLN
30.0000 mmol | Freq: Once | INTRAVENOUS | Status: AC
  Administered 2020-08-11: 30 mmol via INTRAVENOUS
  Filled 2020-08-11: qty 10

## 2020-08-11 MED ORDER — POTASSIUM CHLORIDE 20 MEQ PO PACK: 40.0000 meq | PACK | Freq: Once | ORAL | Status: DC

## 2020-08-11 MED ORDER — POTASSIUM CHLORIDE 10 MEQ/100ML IV SOLN: 10.0000 meq | Freq: Once | INTRAVENOUS | Status: DC

## 2020-08-11 MED ORDER — SODIUM CHLORIDE 0.9% FLUSH: 10.0000 mL | INTRAVENOUS | Status: DC | PRN

## 2020-08-11 MED ORDER — POTASSIUM CHLORIDE 10 MEQ/100ML IV SOLN
10.0000 meq | INTRAVENOUS | Status: AC
  Administered 2020-08-11 (×5): 10 meq via INTRAVENOUS
  Filled 2020-08-11 (×5): qty 100

## 2020-08-11 MED ORDER — K PHOS MONO-SOD PHOS DI & MONO 155-852-130 MG PO TABS: 250.0000 mg | ORAL_TABLET | Freq: Three times a day (TID) | ORAL | Status: DC

## 2020-08-11 NOTE — Progress Notes (Signed)
Peripherally Inserted Central Catheter Placement  The IV Nurse has discussed with the patient and/or persons authorized to consent for the patient, the purpose of this procedure and the potential benefits and risks involved with this procedure.  The benefits include less needle sticks, lab draws from the catheter, and the patient may be discharged home with the catheter. Risks include, but not limited to, infection, bleeding, blood clot (thrombus formation), and puncture of an artery; nerve damage and irregular heartbeat and possibility to perform a PICC exchange if needed/ordered by physician.  Alternatives to this procedure were also discussed.  Bard Power PICC patient education guide, fact sheet on infection prevention and patient information card has been provided to patient /or left at bedside.    PICC Placement Documentation  PICC Double Lumen 07/68/08 PICC Right Basilic 37 cm 0 cm (Active)  Indication for Insertion or Continuance of Line Prolonged intravenous therapies;Limited venous access - need for IV therapy >5 days (PICC only) 08/11/20 2225  Exposed Catheter (cm) 0 cm 08/11/20 2225  Site Assessment Clean;Dry;Intact 08/11/20 2225  Lumen #1 Status Flushed;Saline locked;Blood return noted 08/11/20 2225  Lumen #2 Status Flushed;Saline locked;Blood return noted 08/11/20 2225  Dressing Type Transparent 08/11/20 2225  Dressing Status Clean;Dry;Intact 08/11/20 2225  Antimicrobial disc in place? Yes 08/11/20 2225  Safety Lock Not Applicable 81/10/31 5945  Line Care Connections checked and tightened 08/11/20 2225  Line Adjustment (NICU/IV Team Only) No 08/11/20 2225  Dressing Intervention New dressing 08/11/20 2225  Dressing Change Due 08/18/20 08/11/20 [place], Nicolette Bang 08/11/2020, 10:28 PM

## 2020-08-11 NOTE — Progress Notes (Signed)
Physician notified of rectal temp and right eye edema.

## 2020-08-11 NOTE — Progress Notes (Signed)
Attempted to call wife for consent for PICC at each of the  numbers listed in the computer x2.  Will attempt to reach her again later tonight.  Patient is not arousable enough to sign his own consent.  Zella Ball, RN updated.

## 2020-08-11 NOTE — Progress Notes (Signed)
This note also relates to the following rows which could not be included: ECG Heart Rate - Cannot attach notes to unvalidated device data    08/11/20 1600  Assess: MEWS Score  Temp (!) 89.9 F (32.2 C)  BP (!) 149/83  Pulse Rate (!) 48  Resp 18  Level of Consciousness Alert  SpO2 99 %  O2 Device Nasal Cannula  O2 Flow Rate (L/min) 3 L/min  Assess: MEWS Score  MEWS Temp 2  MEWS Systolic 0  MEWS Pulse 1  MEWS RR 0  MEWS LOC 0  MEWS Score 3  MEWS Score Color Yellow  Assess: if the MEWS score is Yellow or Red  Were vital signs taken at a resting state? Yes  Focused Assessment No change from prior assessment  Early Detection of Sepsis Score *See Row Information* Low  MEWS guidelines implemented *See Row Information* Yes  Treat  MEWS Interventions Administered scheduled meds/treatments  Pain Scale Faces  Pain Score 0  Take Vital Signs  Increase Vital Sign Frequency  Yellow: Q 2hr X 2 then Q 4hr X 2, if remains yellow, continue Q 4hrs  Escalate  MEWS: Escalate Yellow: discuss with charge nurse/RN and consider discussing with provider and RRT  Notify: Charge Nurse/RN  Name of Charge Nurse/RN Notified Courtlanda  Date Charge Nurse/RN Notified 08/11/20  Time Charge Nurse/RN Notified 1615  Notify: Provider  Provider Name/Title Dwyane Dee  Date Provider Notified 08/11/20  Time Provider Notified 1600  Notification Type Page  Notification Reason Other (Comment)  Response See new orders  Date of Provider Response 08/11/20  Time of Provider Response 1603

## 2020-08-11 NOTE — Plan of Care (Signed)
  Problem: Nutrition: Goal: Adequate nutrition will be maintained Outcome: Progressing   Problem: Coping: Goal: Level of anxiety will decrease Outcome: Progressing   

## 2020-08-11 NOTE — Progress Notes (Signed)
Bear hugger applied 

## 2020-08-11 NOTE — Progress Notes (Signed)
This note also relates to the following rows which could not be included: ECG Heart Rate - Cannot attach notes to unvalidated device data    08/11/20 1800  Assess: MEWS Score  Temp (!) 90.3 F (32.4 C)  Level of Consciousness Alert  Assess: MEWS Score  MEWS Temp 2  MEWS Systolic 0  MEWS Pulse 1  MEWS RR 0  MEWS LOC 0  MEWS Score 3  MEWS Score Color Yellow  Assess: if the MEWS score is Yellow or Red  Were vital signs taken at a resting state? Yes  Focused Assessment No change from prior assessment  Early Detection of Sepsis Score *See Row Information* Low  MEWS guidelines implemented *See Row Information* Yes  Treat  MEWS Interventions Administered scheduled meds/treatments  Pain Scale Faces  Pain Score 0  Take Vital Signs  Increase Vital Sign Frequency  Yellow: Q 2hr X 2 then Q 4hr X 2, if remains yellow, continue Q 4hrs  Escalate  MEWS: Escalate Yellow: discuss with charge nurse/RN and consider discussing with provider and RRT  Notify: Charge Nurse/RN  Name of Charge Nurse/RN Notified Courtlanda  Date Charge Nurse/RN Notified 08/11/20  Time Charge Nurse/RN Notified 1800

## 2020-08-11 NOTE — Progress Notes (Signed)
Physician notified of critical potassium 2.7.

## 2020-08-11 NOTE — Progress Notes (Signed)
VAST RN spoke with pt's nurse, Courtlanda regarding possible PICC placement. Due to PICC RN's inability to thread a midline in the left arm yesterday, the only suitable location for PICC placement is right arm. Educated that midline in RA could be pulled and PICC insertion attempted; however, if PICC insertion is unsuccessful, pt will be without IV access. Courtlanda, RN verbalized understanding and confirmed if PICC placement unsuccessful, physician would have to place a CL.  VAST RN spoke with PICC RN today who will call unit and speak with Courtlanda, RN to remove midline once PICC RN is available to attempt PICC placement. PICC RN currently on another campus.

## 2020-08-11 NOTE — Progress Notes (Signed)
Pharmacy Antibiotic Note  Cameron Hale is a 44 y.o. male admitted on 08/08/2020 with unknown source of infection.  Pharmacy has been consulted for Vancomycin and Cefepime dosing.  Patient has not received vancomycin dose this morning. RN states she only has one line to give everything through. May be getting a PICC. Patient is complicated and renal function is difficult to assess d/t quadriplegia. Should plan on vancomycin trough as soon as back on schedule and at steady state to assess.  Plan: Continue Vancomycin 1000 mg IV every 12 hours. Continue Cefepime 2000 mg IV every 8 hours. Monitor labs, c/s, and vanco level as indicated.   Weight: 65.3 kg (144 lb)  Temp (24hrs), Avg:98.5 F (36.9 C), Min:98 F (36.7 C), Max:98.9 F (37.2 C)  Recent Labs  Lab 08/08/20 1247 08/08/20 1403 08/09/20 0449 08/11/20 0625  WBC 10.3  --  11.0* 8.6  CREATININE <0.30*  --  <0.30* <0.30*  LATICACIDVEN 0.8 0.6  --   --     CrCl cannot be calculated (This lab value cannot be used to calculate CrCl because it is not a number: <0.30).    Allergies  Allergen Reactions  . Amoxicillin Itching  . Morphine Itching    Antimicrobials this admission: Vanco 9/21 >>  Cefepime 9/21 >>  Flagyl 9/21   Microbiology results: 9/21 BCx: ng x 3 days 9/21 UCx: ngf    Thank you for allowing [place] to participate in this patients care.   Jens Som, PharmD Please see amion for complete clinical pharmacist phone list. 08/11/2020 1:39 PM

## 2020-08-11 NOTE — Progress Notes (Signed)
Telephone consent obtained from wife for PICC.

## 2020-08-11 NOTE — Progress Notes (Addendum)
PROGRESS NOTE    Cameron Hale  ZOX:[id number] DOB: Nov 09, 1976 DOA: 08/08/2020 PCP: Alroy Dust, L.Marlou Sa, MD    Brief Narrative: 44 year old male with a history of neurofibromatosis type II previously followed by Dr. Prince Rome, neurosurgery at [facility], seizure disorder, hard of  hearing, and legally blind presented to the [facility] ED with episodes of somnolence and unresponsiveness that began on 08/07/2020.  History is obtained from father at bedside. Through the course of the day on 08/07/2020 he returned to his baseline. Her mother/caregiver called his neurologist who recommend giving extra 500 mg of Keppra that he received . Patient became somnolent, lethargic, and unresponsive. His family took his blood pressure and it was 60 mmHgsystolic. Her mother called again his neurologist and therecommendation was tocall EMS and transport patient to [facility]. On route he had persistent hypotension and he was brought to [facility] emergency department.    Patient has been recently diagnosed with neurofibromatosis type II, he underwent neurosurgery intervention in March 2021, post surgery he developed functional quadriplegia, blindness and hearing loss, the only full neurologic function preserved is talking.He spent about 5 months in the hospital.  During his hospitalization he was diagnosed with seizures, and had episode of status epilepticus. He was eventually in August this year discharged home under the care of his parents. Per report Patient had been getting chemotherapy every 3 weeks prior to hospitalization for reducing size of neurofibromas followed by Dr. Shaaron Adler. At baseline, the patient is essentially bedbound but is able to communicate and express his needs.  He is a functional quadriplegic requiring care for all his activities of daily living. He does have a decubitus pressure ulcer stage II.  ED Course:Patient was barely responsive, CT head with advanced meningiomatosis,  unchanged vasogenic edema. No acute hemorrhage. ABG normal pH. Chest radiograph with signs of aspiration pneumonia. [facility] was contacted, unfortunately unable to perform ED to ED transfer. Patient received intravenous fluids, dexamethasone, 2 g of Keppra,2 mg IV lorazepam, aztreonam,vancomycin, cefepime and metronidazole. Neurology at [facility], was consulted and recommended transfer. Patient transferred to [facility] after 2 days.  Patient is admitted for sepsis and started on IV antibiotics.  Patient is back to his baseline cognition.  Patient was seen by neurology recommended to continue Keppra at current dose and sepsis work-up.  Patient found to have bradycardia , EKG : Sinus bradycardia, 2nd degree AV1 cardiology consulted. Recommended to avoid AV nodal medications.  Patient is not a candidate for pacemaker or any intervention given multiple comorbid conditions.    Assessment & Plan:   Principal Problem:   Seizures ([place]) Active Problems:   Paraparesis (HCC)   Mixed conductive and sensorineural hearing loss of right ear with unrestricted hearing of left ear   Quadriplegia (HCC)   AMS (altered mental status)   Aspiration pneumonia ([place])   Neurofibromatosis 2 ([place])   Acute respiratory failure with hypoxia ([place])   Sepsis could be sec.to pneumonia: -Patient had hypothermia, hypotension, and altered mental status present on admission. -Could be secondary to aspiration pneumonia.  -CXR shows Multifocal airspace opacities in the left lung, possible pneumonia. -Continue empiric vancomycin, cefepime, metronidazole  -Lactic acid peaked at 0.8 -Blood culture no growth so far. -UA without significant pyuria -Continue Solu-Cortef. -Patient remains afebrile, mild leucocytosis.  Acute metabolic encephalopathy >>> Improved. -Multifactorial including postictal state, medication and related from high-dose Keppra, and sepsis -08/09/2020--patient is awake and intermittently follows commands,  but is noncommunicative verbally and remains somnolent -08/10/20: Patient is back to  his baseline mental status,  father at bedside and he confirmed. - 9/24: Mother at bedside reports he is much better back to his baseline cognition.  Aspiration pneumonia -Continue  Vancomycin , cefepime and metronidazole. - Chest x-ray--left-sided patchy infiltrates - speech therapy evaluation.  Acute respiratory failure with hypoxia -Secondary to pneumonia. -Currently stable on 2 L nasal cannula with saturation 95-96% -Wean oxygen as tolerated  Seizure disorder: -Continue Keppra 1500 mg IV twice daily. -EEG: Moderate diffuse encephalopathy,  No evidence of seizure disorder. - Patient was seen by neurology @ [facility],  recommended to continue Keppra at current dose and sepsis work-up. 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  Essential hypertension -Holding propranolol secondary to soft blood pressure.  Neurofibromatosis type II,extensive disease, positive vasogenic edema. Patient has been taking brigatinibas part of clinical trial.  Sinus Bradycardia:  Patient has developed sinus bradycardia,  heart rate runs bw 37-57.  EKG sinus bradycardia with first-degree AV block.  Cardiology consulted, thinks this could be secondary to Brigatinib,  Propranolol discontinued. Patient is not a candidate for pacemaker or any intervention given multiple comorbid conditions. Advised to avoid AV nodal blocking medications. HR improved.  Sacral 2 Decubitus ulcer: POA Wound care consulted.  Hypokalemia: K 2.7, replacement in progress, recheck lab in am.  DVT prophylaxis: Lovenox Code Status: Full Family Communication: Spoke with mother at bed side. Disposition Plan:    Status is:  Inpatient  Remains inpatient appropriate because:Inpatient level of care appropriate due to severity of illness   Dispo: The patient is from: Home              Anticipated d/c is to: Home              Anticipated d/c date is: 3 days              Patient currently is not medically stable to d/c.   Consultants:   Neurology, Cardiology  Procedures:  Antimicrobials: Anti-infectives (From admission, onward)   Start     Dose/Rate Route Frequency Ordered Stop   08/09/20 0900  metroNIDAZOLE (FLAGYL) IVPB 500 mg        500 mg 100 mL/hr over 60 Minutes Intravenous Every 8 hours 08/09/20 0826     08/09/20 0900  vancomycin (VANCOCIN) IVPB 1000 mg/200 mL premix        1,000 mg 200 mL/hr over 60 Minutes Intravenous Every 12 hours 08/09/20 0845     08/09/20 0400  vancomycin (VANCOCIN) IVPB 1000 mg/200 mL premix  Status:  Discontinued        1,000 mg 200 mL/hr over 60 Minutes Intravenous Every 12 hours 08/08/20 1507 08/08/20 1827   08/08/20 2200  ceFEPIme (MAXIPIME) 2 g in sodium chloride 0.9 % 100 mL IVPB        2 g 200 mL/hr over 30 Minutes Intravenous Every 8 hours 08/08/20 1345     08/08/20 1500  vancomycin (VANCOREADY) IVPB 750 mg/150 mL        750 mg 150 mL/hr over 60 Minutes Intravenous  Once 08/08/20 1442 08/08/20 1644   08/08/20 1215  aztreonam (AZACTAM) 2 g in sodium chloride 0.9 % 100 mL IVPB        2 g 200 mL/hr over 30 Minutes Intravenous  Once 08/08/20 1209 08/08/20 1344   08/08/20 1215  metroNIDAZOLE (  FLAGYL) IVPB 500 mg        500 mg 100 mL/hr over 60 Minutes Intravenous  Once 08/08/20 1209 08/08/20 1452   08/08/20 1215  vancomycin (VANCOCIN) IVPB 1000 mg/200 mL premix        1,000 mg 200 mL/hr over 60 Minutes Intravenous  Once 08/08/20 1209 08/08/20 1452      Subjective: Patient was seen and examined at bedside.  Overnight events noted.  Patient is alert oriented x 2.   Mother is at bedside reports patient is at baseline mental status. He is not able to move his  extremities.  Objective: Vitals:   08/10/20 2346 08/11/20 0359 08/11/20 0729 08/11/20 1116  BP: 139/88 (!) 125/92 (!) 153/90 (!) 152/92  Pulse: (!) 42 (!) 43 66 82  Resp: 19 17 18 18   Temp: 98 F (36.7 C) 98.6 F (37 C) 98.5 F (36.9 C) 98.5 F (36.9 C)  TempSrc:  Axillary Axillary Axillary  SpO2: 98% 100%  100%  Weight:        Intake/Output Summary (Last 24 hours) at 08/11/2020 1444 Last data filed at 08/11/2020 0900 Gross per 24 hour  Intake 2858.49 ml  Output 250 ml  Net 2608.49 ml   Filed Weights   08/08/20 1430  Weight: 65.3 kg    Examination:  General exam: Appears calm and comfortable. Alert oriented x 2 Respiratory system: Clear to auscultation. Respiratory effort normal. Cardiovascular system: S1 & S2 heard, RRR. No JVD, murmurs, rubs, gallops or clicks.  No pedal edema. Gastrointestinal system: Abdomen is nondistended, soft and nontender. No organomegaly or masses felt.  Normal bowel sounds heard. Central nervous system: Alert and oriented x 2, not able to move extremities. Extremities: No clubbing, no cyanosis, edema ++ noted. Skin: sacral 2 bed sores. No rashes, lesions or ulcers. Psychiatry: Judgement and insight appear normal. Mood & affect appropriate.     Data Reviewed: I have personally reviewed following labs and imaging studies  CBC: Recent Labs  Lab 08/08/20 1247 08/09/20 0449 08/11/20 0625  WBC 10.3 11.0* 8.6  NEUTROABS 8.8*  --   --   HGB 9.5* 9.9* 10.7*  HCT 30.3* 31.4* 33.5*  MCV 85.6 83.3 82.1  PLT 113* 144* 83*   Basic Metabolic Panel: Recent Labs  Lab 08/08/20 1247 08/09/20 0449 08/11/20 0625  NA 141 142 141  K 3.7 3.5 2.7*  CL 111 111 113*  CO2 25 24 22   GLUCOSE 62* 108* 184*  BUN 23* 17 12  CREATININE <0.30* <0.30* <0.30*  CALCIUM 7.7* 8.4* 8.4*  MG  --   --  1.8  PHOS  --   --  2.4*   GFR: CrCl cannot be calculated (This lab value cannot be used to calculate CrCl because it is not a number: <0.30). Liver  Function Tests: Recent Labs  Lab 08/08/20 1247 08/11/20 0625  AST 163* 66*  ALT 233* 122*  ALKPHOS 102 89  BILITOT 0.3 0.4  PROT 4.4* 4.3*  ALBUMIN 1.7* 1.4*   No results for input(s): LIPASE, AMYLASE in the last 168 hours. No results for input(s): AMMONIA in the last 168 hours. Coagulation Profile: Recent Labs  Lab 08/08/20 1403  INR 1.0   Cardiac Enzymes: Recent Labs  Lab 08/08/20 1320  CKTOTAL 30*   BNP (last 3 results) No results for input(s): PROBNP in the last 8760 hours. HbA1C: No results for input(s): HGBA1C in the last 72 hours. CBG: Recent Labs  Lab 08/08/20 1544  GLUCAP 122*  Lipid Profile: No results for input(s): CHOL, HDL, LDLCALC, TRIG, CHOLHDL, LDLDIRECT in the last 72 hours. Thyroid Function Tests: Recent Labs    08/10/20 1844  TSH 1.563   Anemia Panel: No results for input(s): VITAMINB12, FOLATE, FERRITIN, TIBC, IRON, RETICCTPCT in the last 72 hours. Sepsis Labs: Recent Labs  Lab 08/08/20 1247 08/08/20 1403  LATICACIDVEN 0.8 0.6    Recent Results (from the past 240 hour(s))  Urine culture     Status: None   Collection Time: 08/08/20 12:08 PM   Specimen: In/Out Cath Urine  Result Value Ref Range Status   Specimen Description   Final    IN/OUT CATH URINE Performed at [facility], [address], [place], [place] [address]    Special Requests   Final    NONE Performed at [facility], [address] [place], [place] [address]    Culture   Final    NO GROWTH Performed at [facility], [place] [address] [place], [place] [address]    Report Status 08/09/2020 FINAL  Final  SARS Coronavirus 2 by RT PCR (hospital order, performed in [facility] hospital lab) Nasopharyngeal Nasopharyngeal Swab     Status: None   Collection Time: 08/08/20 12:37 PM   Specimen: Nasopharyngeal Swab  Result Value Ref Range Status   SARS Coronavirus 2 NEGATIVE NEGATIVE Final    Comment: (NOTE) SARS-CoV-2 target nucleic acids are NOT DETECTED.  The  SARS-CoV-2 RNA is generally detectable in upper and lower respiratory specimens during the acute phase of infection. The lowest concentration of SARS-CoV-2 viral copies this assay can detect is 250 copies / mL. A negative result does not preclude SARS-CoV-2 infection and should not be used as the sole basis for treatment or other patient management decisions.  A negative result may occur with improper specimen collection / handling, submission of specimen other than nasopharyngeal swab, presence of viral mutation(s) within the areas targeted by this assay, and inadequate number of viral copies (<250 copies / mL). A negative result must be combined with clinical observations, patient history, and epidemiological information.  Fact Sheet for Patients:   StrictlyIdeas.no  Fact Sheet for Healthcare Providers: BankingDealers.co.za  This test is not yet approved or  cleared by the [place] FDA and has been authorized for detection and/or diagnosis of SARS-CoV-2 by FDA under an Emergency Use Authorization (EUA).  This EUA will remain in effect (meaning this test can be used) for the duration of the COVID-19 declaration under Section 564(b)(1) of the Act, 21 U.S.C. section 360bbb-3(b)(1), unless the authorization is terminated or revoked sooner.  Performed at [facility], [address], [place], [place] [address]   Blood Culture (routine x 2)     Status: None (Preliminary result)   Collection Time: 08/08/20 12:47 PM   Specimen: BLOOD RIGHT HAND  Result Value Ref Range Status   Specimen Description   Final    BLOOD RIGHT HAND BOTTLES DRAWN AEROBIC AND ANAEROBIC   Special Requests   Final    Blood Culture results may not be optimal due to an inadequate volume of blood received in culture bottles   Culture   Final    NO GROWTH 3 DAYS Performed at [facility], [address] [place], [place] [address]    Report Status PENDING   Incomplete  Blood Culture (routine x 2)     Status: None (Preliminary result)   Collection Time: 08/08/20  1:20 PM   Specimen: BLOOD RIGHT FOREARM  Result Value  Ref Range Status   Specimen Description   Final    BLOOD RIGHT FOREARM BOTTLES DRAWN AEROBIC AND ANAEROBIC   Special Requests   Final    Blood Culture results may not be optimal due to an excessive volume of blood received in culture bottles   Culture   Final    NO GROWTH 3 DAYS Performed at [facility], [address] [place], [place] [address]    Report Status PENDING  Incomplete     Radiology Studies: [place] EKG SITE RITE  Result Date: 08/11/2020 If Site Rite image not attached, placement could not be confirmed due to current cardiac rhythm.  [place] EKG SITE RITE  Result Date: 08/10/2020 If Site Rite image not attached, placement could not be confirmed due to current cardiac rhythm.  [place] EKG SITE RITE  Result Date: 08/10/2020 If Site Rite image not attached, placement could not be confirmed due to current cardiac rhythm.   Scheduled Meds: . Chlorhexidine Gluconate Cloth  6 each Topical Daily  . collagenase   Topical BID  . hydrocortisone sod succinate (SOLU-CORTEF) inj  100 mg Intravenous Q8H  . sodium chloride flush  10-40 mL Intracatheter Q12H   Continuous Infusions: . ceFEPime (MAXIPIME) IV 2 g (08/11/20 0544)  . dextrose 5% lactated ringers 100 mL/hr at 08/11/20 1407  . levETIRAcetam 1,500 mg (08/11/20 1227)  . metronidazole 500 mg (08/11/20 1044)  . potassium chloride 10 mEq (08/11/20 1358)  . potassium PHOSPHATE IVPB (in mmol) 30 mmol (08/11/20 1022)  . vancomycin 1,000 mg (08/10/20 2042)     LOS: 3 days    Time spent: 25 mins.    Shawna Clamp, MD [facility]   If 7PM-7AM, please contact night-coverage

## 2020-08-11 NOTE — Care Management Important Message (Signed)
Important Message  Patient Details  Name: Cameron Hale [name]: [id number] Date of Birth: 12-18-1975   Medicare Important Message Given:  Yes     Orbie Pyo 08/11/2020, 2:34 PM

## 2020-08-12 ENCOUNTER — Inpatient Hospital Stay (HOSPITAL_COMMUNITY): Payer: Medicare Other

## 2020-08-12 DIAGNOSIS — R569 Unspecified convulsions: Secondary | ICD-10-CM

## 2020-08-12 LAB — CBC
HCT: 26.2 % — ABNORMAL LOW (ref 39.0–52.0)
Hemoglobin: 8.7 g/dL — ABNORMAL LOW (ref 13.0–17.0)
MCH: 26.4 pg (ref 26.0–34.0)
MCHC: 33.2 g/dL (ref 30.0–36.0)
MCV: 79.4 fL — ABNORMAL LOW (ref 80.0–100.0)
Platelets: 104 10*3/uL — ABNORMAL LOW (ref 150–400)
RBC: 3.3 MIL/uL — ABNORMAL LOW (ref 4.22–5.81)
RDW: 17.4 % — ABNORMAL HIGH (ref 11.5–15.5)
WBC: 9.7 10*3/uL (ref 4.0–10.5)
nRBC: 0.6 % — ABNORMAL HIGH (ref 0.0–0.2)

## 2020-08-12 LAB — BLOOD GAS, ARTERIAL
Acid-base deficit: 3.6 mmol/L — ABNORMAL HIGH (ref 0.0–2.0)
Bicarbonate: 20.4 mmol/L (ref 20.0–28.0)
Drawn by: 406621
FIO2: 50
O2 Saturation: 96.5 %
Patient temperature: 37
pCO2 arterial: 34.1 mmHg (ref 32.0–48.0)
pH, Arterial: 7.395 (ref 7.350–7.450)
pO2, Arterial: 86 mmHg (ref 83.0–108.0)

## 2020-08-12 LAB — PROTEIN, PLEURAL OR PERITONEAL FLUID: Total protein, fluid: <3 g/dL

## 2020-08-12 LAB — POCT I-STAT 7, (LYTES, BLD GAS, ICA,H+H)
Acid-base deficit: 8 mmol/L — ABNORMAL HIGH (ref 0.0–2.0)
Acid-base deficit: 7 mmol/L — ABNORMAL HIGH (ref 0.0–2.0)
Bicarbonate: 18 mmol/L — ABNORMAL LOW (ref 20.0–28.0)
Bicarbonate: 17 mmol/L — ABNORMAL LOW (ref 20.0–28.0)
Calcium, Ion: 1.24 mmol/L (ref 1.15–1.40)
Calcium, Ion: 1.24 mmol/L (ref 1.15–1.40)
HCT: 30 % — ABNORMAL LOW (ref 39.0–52.0)
HCT: 42 % (ref 39.0–52.0)
Hemoglobin: 10.2 g/dL — ABNORMAL LOW (ref 13.0–17.0)
Hemoglobin: 14.3 g/dL (ref 13.0–17.0)
O2 Saturation: 83 %
O2 Saturation: 88 %
Patient temperature: 32.6
Potassium: 3.5 mmol/L (ref 3.5–5.1)
Potassium: 3.2 mmol/L — ABNORMAL LOW (ref 3.5–5.1)
Sodium: 148 mmol/L — ABNORMAL HIGH (ref 135–145)
Sodium: 146 mmol/L — ABNORMAL HIGH (ref 135–145)
TCO2: 19 mmol/L — ABNORMAL LOW (ref 22–32)
TCO2: 18 mmol/L — ABNORMAL LOW (ref 22–32)
pCO2 arterial: 35.8 mmHg (ref 32.0–48.0)
pCO2 arterial: 24.8 mmHg — ABNORMAL LOW (ref 32.0–48.0)
pH, Arterial: 7.309 — ABNORMAL LOW (ref 7.350–7.450)
pH, Arterial: 7.422 (ref 7.350–7.450)
pO2, Arterial: 51 mmHg — ABNORMAL LOW (ref 83.0–108.0)
pO2, Arterial: 41 mmHg — ABNORMAL LOW (ref 83.0–108.0)

## 2020-08-12 LAB — BASIC METABOLIC PANEL
Anion gap: 8 (ref 5–15)
BUN: 14 mg/dL (ref 6–20)
CO2: 18 mmol/L — ABNORMAL LOW (ref 22–32)
Calcium: 7.5 mg/dL — ABNORMAL LOW (ref 8.9–10.3)
Chloride: 118 mmol/L — ABNORMAL HIGH (ref 98–111)
Creatinine, Ser: 0.42 mg/dL — ABNORMAL LOW (ref 0.61–1.24)
GFR calc Af Amer: >60 mL/min (ref >60)
GFR calc non Af Amer: >60 mL/min (ref >60)
Glucose, Bld: 58 mg/dL — ABNORMAL LOW (ref 70–99)
Potassium: 4 mmol/L (ref 3.5–5.1)
Sodium: 144 mmol/L (ref 135–145)

## 2020-08-12 LAB — LACTATE DEHYDROGENASE, PLEURAL OR PERITONEAL FLUID: LD, Fluid: 98 U/L — ABNORMAL HIGH (ref 3–23)

## 2020-08-12 LAB — BODY FLUID CELL COUNT WITH DIFFERENTIAL
Lymphs, Fluid: 19 %
Monocyte-Macrophage-Serous Fluid: 7 % — ABNORMAL LOW (ref 50–90)
Neutrophil Count, Fluid: 74 % — ABNORMAL HIGH (ref 0–25)
Total Nucleated Cell Count, Fluid: 184 cu mm (ref 0–1000)

## 2020-08-12 LAB — GLUCOSE, CAPILLARY
Glucose-Capillary: 106 mg/dL — ABNORMAL HIGH (ref 70–99)
Glucose-Capillary: 115 mg/dL — ABNORMAL HIGH (ref 70–99)
Glucose-Capillary: 198 mg/dL — ABNORMAL HIGH (ref 70–99)
Glucose-Capillary: 68 mg/dL — ABNORMAL LOW (ref 70–99)
Glucose-Capillary: 94 mg/dL (ref 70–99)
Glucose-Capillary: 95 mg/dL (ref 70–99)

## 2020-08-12 LAB — AMMONIA: Ammonia: 23 umol/L (ref 9–35)

## 2020-08-12 LAB — GRAM STAIN

## 2020-08-12 LAB — HEMOGLOBIN AND HEMATOCRIT, BLOOD
HCT: 28.9 % — ABNORMAL LOW (ref 39.0–52.0)
Hemoglobin: 9.2 g/dL — ABNORMAL LOW (ref 13.0–17.0)

## 2020-08-12 LAB — PHOSPHORUS: Phosphorus: 3.6 mg/dL (ref 2.5–4.6)

## 2020-08-12 LAB — LACTIC ACID, PLASMA: Lactic Acid, Venous: 0.7 mmol/L (ref 0.5–1.9)

## 2020-08-12 LAB — TSH: TSH: 2.273 u[IU]/mL (ref 0.350–4.500)

## 2020-08-12 LAB — CK: Total CK: 17 U/L — ABNORMAL LOW (ref 49–397)

## 2020-08-12 LAB — PROCALCITONIN: Procalcitonin: 0.1 ng/mL

## 2020-08-12 LAB — VITAMIN B12: Vitamin B-12: 1492 pg/mL — ABNORMAL HIGH (ref 180–914)

## 2020-08-12 MED ORDER — ALBUMIN HUMAN 25 % IV SOLN
25.0000 g | Freq: Once | INTRAVENOUS | Status: AC
  Administered 2020-08-12: 25 g via INTRAVENOUS
  Filled 2020-08-12: qty 100

## 2020-08-12 MED ORDER — FENTANYL CITRATE (PF) 100 MCG/2ML IJ SOLN
50.0000 ug | Freq: Once | INTRAMUSCULAR | Status: AC
  Administered 2020-08-12: 50 ug via INTRAVENOUS

## 2020-08-12 MED ORDER — SODIUM CHLORIDE 0.9 % IV BOLUS
500.0000 mL | Freq: Once | INTRAVENOUS | Status: AC
  Administered 2020-08-12: 500 mL via INTRAVENOUS

## 2020-08-12 MED ORDER — MIDAZOLAM HCL 2 MG/2ML IJ SOLN
INTRAMUSCULAR | Status: AC
  Filled 2020-08-12: qty 2

## 2020-08-12 MED ORDER — DEXTROSE 50 % IV SOLN
INTRAVENOUS | Status: AC
  Administered 2020-08-12: 50 mL via INTRAVENOUS
  Filled 2020-08-12: qty 50

## 2020-08-12 MED ORDER — DEXMEDETOMIDINE HCL IN NACL 400 MCG/100ML IV SOLN
0.1000 ug/kg/h | INTRAVENOUS | Status: DC
  Administered 2020-08-12: 0.4 ug/kg/h via INTRAVENOUS
  Filled 2020-08-12 (×2): qty 100

## 2020-08-12 MED ORDER — MIDAZOLAM HCL 2 MG/2ML IJ SOLN
2.0000 mg | Freq: Once | INTRAMUSCULAR | Status: AC
  Administered 2020-08-12: 2 mg via INTRAVENOUS

## 2020-08-12 MED ORDER — ORAL CARE MOUTH RINSE
15.0000 mL | OROMUCOSAL | Status: DC
  Administered 2020-08-12 – 2020-08-21 (×88): 15 mL via OROMUCOSAL

## 2020-08-12 MED ORDER — PANTOPRAZOLE SODIUM 40 MG IV SOLR
40.0000 mg | Freq: Every day | INTRAVENOUS | Status: DC
  Administered 2020-08-12 – 2020-08-15 (×4): 40 mg via INTRAVENOUS
  Filled 2020-08-12 (×4): qty 40

## 2020-08-12 MED ORDER — ALBUTEROL SULFATE HFA 108 (90 BASE) MCG/ACT IN AERS
1.0000 | INHALATION_SPRAY | Freq: Four times a day (QID) | RESPIRATORY_TRACT | Status: DC | PRN
  Filled 2020-08-12: qty 6.7

## 2020-08-12 MED ORDER — CHLORHEXIDINE GLUCONATE 0.12% ORAL RINSE (MEDLINE KIT)
15.0000 mL | Freq: Two times a day (BID) | OROMUCOSAL | Status: DC
  Administered 2020-08-12 – 2020-08-21 (×18): 15 mL via OROMUCOSAL

## 2020-08-12 MED ORDER — PHENYLEPHRINE 40 MCG/ML (10ML) SYRINGE FOR IV PUSH (FOR BLOOD PRESSURE SUPPORT)
PREFILLED_SYRINGE | INTRAVENOUS | Status: AC
  Filled 2020-08-12: qty 10

## 2020-08-12 MED ORDER — FENTANYL CITRATE (PF) 100 MCG/2ML IJ SOLN
50.0000 ug | INTRAMUSCULAR | Status: DC | PRN
  Administered 2020-08-13 – 2020-08-15 (×5): 50 ug via INTRAVENOUS
  Filled 2020-08-12 (×6): qty 2

## 2020-08-12 MED ORDER — FENTANYL CITRATE (PF) 100 MCG/2ML IJ SOLN
INTRAMUSCULAR | Status: AC
Start: 2020-08-12 — End: 2020-08-13
  Filled 2020-08-12: qty 2

## 2020-08-12 MED ORDER — FUROSEMIDE 10 MG/ML IJ SOLN
40.0000 mg | Freq: Four times a day (QID) | INTRAMUSCULAR | Status: AC
  Administered 2020-08-12 – 2020-08-13 (×2): 40 mg via INTRAVENOUS
  Filled 2020-08-12 (×2): qty 4

## 2020-08-12 MED ORDER — DEXTROSE 50 % IV SOLN: 1.0000 | Freq: Once | INTRAVENOUS | Status: AC

## 2020-08-12 MED ORDER — ENOXAPARIN SODIUM 40 MG/0.4ML ~~LOC~~ SOLN
40.0000 mg | SUBCUTANEOUS | Status: DC
  Administered 2020-08-12 – 2020-09-01 (×21): 40 mg via SUBCUTANEOUS
  Filled 2020-08-12 (×21): qty 0.4

## 2020-08-12 MED ORDER — MAGNESIUM SULFATE 4 GM/100ML IV SOLN
4.0000 g | Freq: Once | INTRAVENOUS | Status: AC
  Administered 2020-08-12: 4 g via INTRAVENOUS
  Filled 2020-08-12: qty 100

## 2020-08-12 MED ORDER — VITAL HIGH PROTEIN PO LIQD
1000.0000 mL | ORAL | Status: DC
  Administered 2020-08-13: 1000 mL

## 2020-08-12 NOTE — Progress Notes (Signed)
PROGRESS NOTE    Cameron Hale  AST:[id number] DOB: 10/10/1976 DOA: 08/08/2020 PCP: Alroy Dust, L.Marlou Sa, MD    Brief Narrative: 44 year old male with a history of neurofibromatosis type II previously followed by Dr. Prince Rome, neurosurgery at [facility], seizure disorder, hard of  hearing, and legally blind presented to the [facility] ED with episodes of somnolence and unresponsiveness that began on 08/07/2020.  History is obtained from father at bedside. Through the course of the day on 08/07/2020 he returned to his baseline. Her mother/caregiver called his neurologist who recommend giving extra 500 mg of Keppra that he received . Patient became somnolent, lethargic, and unresponsive. His family took his blood pressure and it was 60 mmHgsystolic. Her mother called again his neurologist and therecommendation was tocall EMS and transport patient to [facility]. On route he had persistent hypotension and he was brought to [facility] emergency department.    Patient has been recently diagnosed with neurofibromatosis type II, he underwent neurosurgery intervention in March 2021, post surgery he developed functional quadriplegia, blindness and hearing loss, the only full neurologic function preserved is talking.He spent about 5 months in the hospital.  During his hospitalization he was diagnosed with seizures, and had episode of status epilepticus. He was eventually in August this year discharged home under the care of his parents. Per report Patient had been getting chemotherapy every 3 weeks prior to hospitalization for reducing size of neurofibromas followed by Dr. Shaaron Adler. At baseline, the patient is essentially bedbound but is able to communicate and express his needs.  He is a functional quadriplegic requiring care for all his activities of daily living. He does have a decubitus pressure ulcer stage II.  ED Course:Patient was barely responsive, CT head with advanced meningiomatosis,  unchanged vasogenic edema. No acute hemorrhage. ABG normal pH. Chest radiograph with signs of aspiration pneumonia. [facility] was contacted, unfortunately unable to perform ED to ED transfer. Patient received intravenous fluids, dexamethasone, 2 g of Keppra,2 mg IV lorazepam, aztreonam,vancomycin, cefepime and metronidazole. Neurology at [facility], was consulted and recommended transfer. Patient transferred to [facility] cone after 2 days.  Patient is admitted for sepsis and started on IV antibiotics.  Patient is back to his baseline cognition.  Patient was seen by neurology recommended to continue Keppra at current dose and sepsis work-up.  Patient found to have bradycardia , EKG : Sinus bradycardia, 2nd degree AV1 cardiology consulted. Recommended to avoid AV nodal medications.  Patient is not a candidate for pacemaker or any intervention given multiple comorbid conditions.  9/24-9/25 Patient became lethargic, hypothermic, hypoxic and hypotensive in the night, Patient was placed on Bair hugger's, oxygen requirement has increased from 3 L to 15 L, remained hypotensive, was given albumin infusion as per PCCM recommendation.  Patient seems very lethargic in the morning, O2 sats 90 on BiPAP, very labile blood pressure, PCCM consulted,  Patient got accepted in ICU for further management.   Assessment & Plan:   Principal Problem:   Seizures ([place]) Active Problems:   Paraparesis (HCC)   Mixed conductive and sensorineural hearing loss of right ear with unrestricted hearing of left ear   Quadriplegia (HCC)   AMS (altered mental status)   Aspiration pneumonia ([place])   Neurofibromatosis 2 ([place])   Acute respiratory failure with hypoxia ([place])   Sepsis could be sec.to pneumonia: -Patient had hypothermia, hypotension, and altered mental status present on admission. -Could be secondary to aspiration pneumonia.  -CXR shows Multifocal airspace opacities in the left lung, possible pneumonia. -Continue  empiric  vancomycin, cefepime, metronidazole  -Lactic acid peaked at 0.8 -Blood culture no growth so far. -UA without significant pyuria -Continue Solu-Cortef. -Patient remains afebrile, mild leucocytosis. - 9/25: Patient became hypothermic, hypotensive lethargic and hypoxic. -Patient got accepted in the ICU for further care.  Acute metabolic encephalopathy  -Multifactorial including postictal state, medication and related from high-dose Keppra, and sepsis -08/09/2020--patient is awake and intermittently follows commands, but is noncommunicative verbally and remains somnolent -08/10/20: Patient is back to his baseline mental status,  father at bedside and he confirmed. - 9/24: Mother at bedside reports he is much better back to his baseline cognition. -9/25 : Patient was lethargic, fingersticks 63 patient was given dextrose 50%.  He responds, but remains hypoxic.  Aspiration pneumonia -Continue  Vancomycin , cefepime and metronidazole. - Chest x-ray--left-sided patchy infiltrates - speech therapy evaluation.  Acute respiratory failure with hypoxia -Secondary to pneumonia. -Currently stable on 2 L nasal cannula with saturation 95-96% -Overnight he became hypoxic,  oxygen requirement increased from 2 L to 15 L , ended up on BiPAP. -Patient is accepted in the ICU.  Seizure disorder: -Continue Keppra 1500 mg IV twice daily. -EEG: Moderate diffuse encephalopathy,  No evidence of seizure disorder. - Patient was seen by neurology @ [facility],  recommended to continue Keppra at current dose and sepsis work-up. 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  Essential hypertension -Holding propranolol secondary to soft blood pressure.  Neurofibromatosis type II,extensive disease, positive  vasogenic edema. Patient has been taking brigatinibas part of clinical trial.  Sinus Bradycardia:  Patient has developed sinus bradycardia,  heart rate runs bw 37-57.  EKG sinus bradycardia with first-degree AV block.  Cardiology consulted, thinks this could be secondary to Brigatinib,  Propranolol discontinued. Patient is not a candidate for pacemaker or any intervention given multiple comorbid conditions. Advised to avoid AV nodal blocking medications. HR improved.  Sacral 2 Decubitus ulcer: POA Wound care consulted.  Hypokalemia: Improved. K 2.7>> 4.0  normal  DVT prophylaxis: Lovenox Code Status: Full Family Communication: Spoke with mother at bed side. Disposition Plan:    Status is: Inpatient  Remains inpatient appropriate because:Inpatient level of care appropriate due to severity of illness   Dispo: The patient is from: Home              Anticipated d/c is to: Home              Anticipated d/c date is: 3 days              Patient currently is not medically stable to d/c.   Consultants:   Neurology, Cardiology  Procedures:  Antimicrobials: Anti-infectives (From admission, onward)   Start     Dose/Rate Route Frequency Ordered Stop   08/09/20 0900  metroNIDAZOLE (FLAGYL) IVPB 500 mg        500 mg 100 mL/hr over 60 Minutes Intravenous Every 8 hours 08/09/20 0826 08/16/20 0859   08/09/20 0900  vancomycin (VANCOCIN) IVPB 1000 mg/200 mL premix  Status:  Discontinued        1,000 mg 200 mL/hr over 60 Minutes Intravenous Every 12 hours 08/09/20 0845 08/12/20 0835   08/09/20 0400  vancomycin (VANCOCIN) IVPB 1000 mg/200 mL premix  Status:  Discontinued        1,000 mg 200 mL/hr over 60 Minutes Intravenous  Every 12 hours 08/08/20 1507 08/08/20 1827   08/08/20 2200  ceFEPIme (MAXIPIME) 2 g in sodium chloride 0.9 % 100 mL IVPB        2 g 200 mL/hr over 30 Minutes Intravenous Every 8 hours 08/08/20 1345 08/15/20 2159   08/08/20 1500  vancomycin (VANCOREADY) IVPB 750  mg/150 mL        750 mg 150 mL/hr over 60 Minutes Intravenous  Once 08/08/20 1442 08/08/20 1644   08/08/20 1215  aztreonam (AZACTAM) 2 g in sodium chloride 0.9 % 100 mL IVPB        2 g 200 mL/hr over 30 Minutes Intravenous  Once 08/08/20 1209 08/08/20 1344   08/08/20 1215  metroNIDAZOLE (FLAGYL) IVPB 500 mg        500 mg 100 mL/hr over 60 Minutes Intravenous  Once 08/08/20 1209 08/08/20 1452   08/08/20 1215  vancomycin (VANCOCIN) IVPB 1000 mg/200 mL premix        1,000 mg 200 mL/hr over 60 Minutes Intravenous  Once 08/08/20 1209 08/08/20 1452      Subjective: Patient was seen and examined at bedside.  Overnight events noted.  Patient was found lethargic, hypoxic, he is on BiPAP sats 90%,  Hypotensive , patient seems unstable.  PCCM consulted,  Patient got transferred to ICU for further care.  Objective: Vitals:   08/12/20 0715 08/12/20 1139 08/12/20 1200 08/12/20 1300  BP: (!) 85/40  102/70 101/64  Pulse: 85 77 82 65  Resp: (!) 32 (!) 22 (!) 28 20  Temp:      TempSrc:      SpO2: 94% (!) 88% (!) 85% 92%  Weight:        Intake/Output Summary (Last 24 hours) at 08/12/2020 1331 Last data filed at 08/12/2020 1200 Gross per 24 hour  Intake 2847.83 ml  Output 800 ml  Net 2047.83 ml   Filed Weights   08/08/20 1430  Weight: 65.3 kg    Examination:  General exam: Appears calm and comfortable. Lethargic Respiratory system: basilar crackles,  Respiratory effort normal. Cardiovascular system: S1 & S2 heard, RRR. No JVD, murmurs, rubs, gallops or clicks.  No pedal edema. Gastrointestinal system: Abdomen is nondistended, soft and nontender. No organomegaly or masses felt.  Normal bowel sounds heard. Central nervous system: Lethargic, not able to move extremities. Extremities: No clubbing, no cyanosis, edema ++ noted. Skin: sacral 2 bed sores. No rashes, lesions or ulcers. Psychiatry: Judgement and insight appear normal. Mood & affect appropriate.     Data Reviewed: I have  personally reviewed following labs and imaging studies  CBC: Recent Labs  Lab 08/08/20 1247 08/08/20 1247 08/09/20 0449 08/11/20 0625 08/11/20 2232 08/12/20 0630 08/12/20 1207  WBC 10.3  --  11.0* 8.6 10.0 9.7  --   NEUTROABS 8.8*  --   --   --   --   --   --   HGB 9.5*   < > 9.9* 10.7* 10.7* 8.7* 9.2*  HCT 30.3*   < > 31.4* 33.5* 32.9* 26.2* 28.9*  MCV 85.6  --  83.3 82.1 79.1* 79.4*  --   PLT 113*  --  144* 83* 119* 104*  --    < > = values in this interval not displayed.   Basic Metabolic Panel: Recent Labs  Lab 08/08/20 1247 08/09/20 0449 08/11/20 0625 08/11/20 2232 08/12/20 0630  NA 141 142 141 144 144  K 3.7 3.5 2.7* 4.3 4.0  CL 111 111 113* 115* 118*  CO2  25 24 22 22  18*  GLUCOSE 62* 108* 184* 121* 58*  BUN 23* 17 12 12 14   CREATININE <0.30* <0.30* <0.30* <0.30* 0.42*  CALCIUM 7.7* 8.4* 8.4* 8.4* 7.5*  MG  --   --  1.8 1.6*  --   PHOS  --   --  2.4*  --  3.6   GFR: Estimated Creatinine Clearance: 107.7 mL/min (A) (by C-G formula based on SCr of 0.42 mg/dL (L)). Liver Function Tests: Recent Labs  Lab 08/08/20 1247 08/11/20 0625 08/11/20 2232  AST 163* 66* 40  ALT 233* 122* 112*  ALKPHOS 102 89 93  BILITOT 0.3 0.4 0.5  PROT 4.4* 4.3* 4.3*  ALBUMIN 1.7* 1.4* 1.4*   No results for input(s): LIPASE, AMYLASE in the last 168 hours. Recent Labs  Lab 08/12/20 1207  AMMONIA 23   Coagulation Profile: Recent Labs  Lab 08/08/20 1403  INR 1.0   Cardiac Enzymes: Recent Labs  Lab 08/08/20 1320 08/12/20 1207  CKTOTAL 30* 17*   BNP (last 3 results) No results for input(s): PROBNP in the last 8760 hours. HbA1C: No results for input(s): HGBA1C in the last 72 hours. CBG: Recent Labs  Lab 08/08/20 1544 08/12/20 0804 08/12/20 0926 08/12/20 1204  GLUCAP 122* 68* 198* 95   Lipid Profile: No results for input(s): CHOL, HDL, LDLCALC, TRIG, CHOLHDL, LDLDIRECT in the last 72 hours. Thyroid Function Tests: Recent Labs    08/12/20 1207  TSH 2.273    Anemia Panel: Recent Labs    08/12/20 1207  VITAMINB12 1,492*   Sepsis Labs: Recent Labs  Lab 08/08/20 1247 08/08/20 1403 08/11/20 2232 08/12/20 1207  PROCALCITON  --   --  0.10  --   LATICACIDVEN 0.8 0.6 1.7 0.7    Recent Results (from the past 240 hour(s))  Urine culture     Status: None   Collection Time: 08/08/20 12:08 PM   Specimen: In/Out Cath Urine  Result Value Ref Range Status   Specimen Description   Final    IN/OUT CATH URINE Performed at [facility], [address], [place], [place] [address]    Special Requests   Final    NONE Performed at [facility], [address] [place], [place] [address]    Culture   Final    NO GROWTH Performed at [facility], [place] [address], [place], [place] [address]    Report Status 08/09/2020 FINAL  Final  SARS Coronavirus 2 by RT PCR (hospital order, performed in [facility] hospital lab) Nasopharyngeal Nasopharyngeal Swab     Status: None   Collection Time: 08/08/20 12:37 PM   Specimen: Nasopharyngeal Swab  Result Value Ref Range Status   SARS Coronavirus 2 NEGATIVE NEGATIVE Final    Comment: (NOTE) SARS-CoV-2 target nucleic acids are NOT DETECTED.  The SARS-CoV-2 RNA is generally detectable in upper and lower respiratory specimens during the acute phase of infection. The lowest concentration of SARS-CoV-2 viral copies this assay can detect is 250 copies / mL. A negative result does not preclude SARS-CoV-2 infection and should not be used as the sole basis for treatment or other patient management decisions.  A negative result may occur with improper specimen collection / handling, submission of specimen other than nasopharyngeal swab, presence of viral mutation(s) within the areas targeted by this assay, and inadequate number of viral copies (<250 copies / mL). A negative result must be combined with clinical observations, patient history, and epidemiological information.  Fact Sheet for Patients:  StrictlyIdeas.no  Fact Sheet for Healthcare Providers: BankingDealers.co.za  This test is not yet approved or  cleared by the [place] FDA and has been authorized for detection and/or diagnosis of SARS-CoV-2 by FDA under an Emergency Use Authorization (EUA).  This EUA will remain in effect (meaning this test can be used) for the duration of the COVID-19 declaration under Section 564(b)(1) of the Act, 21 U.S.C. section 360bbb-3(b)(1), unless the authorization is terminated or revoked sooner.  Performed at [facility], [address] [place], [place] [address]   Blood Culture (routine x 2)     Status: None (Preliminary result)   Collection Time: 08/08/20 12:47 PM   Specimen: BLOOD RIGHT HAND  Result Value Ref Range Status   Specimen Description   Final    BLOOD RIGHT HAND BOTTLES DRAWN AEROBIC AND ANAEROBIC   Special Requests   Final    Blood Culture results may not be optimal due to an inadequate volume of blood received in culture bottles   Culture   Final    NO GROWTH 4 DAYS Performed at [facility], [address] [place], [place] [address]    Report Status PENDING  Incomplete  Blood Culture (routine x 2)     Status: None (Preliminary result)   Collection Time: 08/08/20  1:20 PM   Specimen: BLOOD RIGHT FOREARM  Result Value Ref Range Status   Specimen Description   Final    BLOOD RIGHT FOREARM BOTTLES DRAWN AEROBIC AND ANAEROBIC   Special Requests   Final    Blood Culture results may not be optimal due to an excessive volume of blood received in culture bottles   Culture   Final    NO GROWTH 4 DAYS Performed at [facility], [address], [place], [place] [address]    Report Status PENDING  Incomplete  MRSA PCR Screening     Status: None   Collection Time: 08/11/20  6:18 PM   Specimen: Nasal Mucosa; Nasopharyngeal  Result Value Ref Range Status   MRSA by PCR NEGATIVE NEGATIVE Final    Comment:        The  GeneXpert MRSA Assay (FDA approved for NASAL specimens only), is one component of a comprehensive MRSA colonization surveillance program. It is not intended to diagnose MRSA infection nor to guide or monitor treatment for MRSA infections. Performed at [facility], [place] [address] [place], [place] [address]      Radiology Studies: DG Chest 1 View  Result Date: 08/12/2020 CLINICAL DATA:  Acute respiratory failure. EXAM: CHEST  1 VIEW COMPARISON:  08/11/2020 FINDINGS: Heart size remains normal. Right arm PICC line remains in appropriate position. Posterior spinal fusion hardware again seen. Moderate symmetric bilateral airspace disease shows no significant change. Moderate right and small left pleural effusions are again seen, without significant change allowing for differences in semi-erect positioning on current exam. Increased atelectasis or infiltrate noted in right lung base. No pneumothorax visualized. IMPRESSION: Increased right basilar atelectasis versus infiltrate. Otherwise stable symmetric bilateral airspace disease, and right greater than left pleural effusions. Electronically Signed   By: Marlaine Hind M.D.   On: 08/12/2020 11:48   DG CHEST PORT 1 VIEW  Result Date: 08/11/2020 CLINICAL DATA:  Acute respiratory distress, EXAM: PORTABLE CHEST 1 VIEW COMPARISON:  Radiograph 08/08/2020 FINDINGS: Right upper extremity PICC tip terminates at the superior cavoatrial junction. There is a new moderate right pleural effusion with fluid tracking in the fissures. Suspect layering left effusion as well. Background of  diffuse hazy interstitial opacities and indistinct pulmonary vascularity suggesting underlying pulmonary edema . More patchy opacity seen in the left lung, similar to prior. The cardiomediastinal contours are unremarkable. Redemonstration of the long segment thoracolumbar fusion incompletely visualized within the levels of imaging. The osseous structures appear diffusely  demineralized which may limit detection of small or nondisplaced fractures. IMPRESSION: 1. Right upper extremity PICC tip terminates at the superior cavoatrial junction. 2. New moderate right pleural effusion with fluid tracking in the fissures. Suspect layering left effusion as well. Adjacent passive atelectasis. 3. Redemonstrated opacities in the left lung could reflect a pneumonia. 4. Background of diffuse hazy interstitial opacities and indistinct pulmonary vascularity suggesting underlying pulmonary edema as well. Electronically Signed   By: Lovena Le M.D.   On: 08/11/2020 22:35   [place] EKG SITE RITE  Result Date: 08/11/2020 If Site Rite image not attached, placement could not be confirmed due to current cardiac rhythm.  [place] EKG SITE RITE  Result Date: 08/10/2020 If Site Rite image not attached, placement could not be confirmed due to current cardiac rhythm.  [place] EKG SITE RITE  Result Date: 08/10/2020 If Site Rite image not attached, placement could not be confirmed due to current cardiac rhythm.   Scheduled Meds: . Chlorhexidine Gluconate Cloth  6 each Topical Daily  . collagenase   Topical BID  . enoxaparin (LOVENOX) injection  40 mg Subcutaneous Q24H  . hydrocortisone sod succinate (SOLU-CORTEF) inj  100 mg Intravenous Q8H  . pantoprazole (PROTONIX) IV  40 mg Intravenous QHS  . sodium chloride flush  10-40 mL Intracatheter Q12H  . sodium chloride flush  10-40 mL Intracatheter Q12H   Continuous Infusions: . ceFEPime (MAXIPIME) IV 2 g (08/12/20 0557)  . levETIRAcetam 1,500 mg (08/12/20 0921)  . metronidazole 500 mg (08/12/20 0939)     LOS: 4 days    Time spent: 35 mins.    Shawna Clamp, MD [facility]   If 7PM-7AM, please contact night-coverage

## 2020-08-12 NOTE — Progress Notes (Addendum)
Interim Note Called to bedside for sats in 70s, now in afib, Bps 70s/40s, patient taking shallow ineffectual breaths and unresponsive.  Intubated without sedation, no cough or gag noted.  Wife updated by phone.  Erskine Emery MD PCCM

## 2020-08-12 NOTE — Progress Notes (Signed)
LTM EEG hooked up and running - no initial skin breakdown - push button tested - neuro notified.  

## 2020-08-12 NOTE — Procedures (Addendum)
Patient Name: Cameron Hale  [name]: [id number]  Epilepsy Attending: Lora Havens  Referring Physician/Provider: Dr Ina Homes Date: 08/12/2020 Duration: 24.36 mins  Patient history: 44 year old man with extensive neurofibromatosis 2 lesions in the brain and C-spine who is quadriplegic after a spine surgery, has a history of seizure disorder with worsening mentation. EEG to evaluate for seizure.  Level of alertness:  lethargic  AEDs during EEG study: LEV  Technical aspects: This EEG study was done with scalp electrodes positioned according to the 10-20 International system of electrode placement. Electrical activity was acquired at a sampling rate of 500Hz  and reviewed with a high frequency filter of 70Hz  and a low frequency filter of 1Hz . EEG data were recorded continuously and digitally stored.   Description: No posterior dominant rhythm was seen. EEG showed continuous generalized 3 to 6 Hz theta-delta slowing admixed with intermittent 8-9hz  alpha activity predominantly in posterior head region. Hyperventilation and photic stimulation were not performed.     ABNORMALITY -Continuous slow, generalized  IMPRESSION: This study is suggestive of moderate diffuse encephalopathy, nonspecific etiology. No seizures or epileptiform discharges were seen throughout the recording.  [name] [name]

## 2020-08-12 NOTE — Procedures (Signed)
Insertion of Chest Tube Procedure Note  QUINT CHESTNUT  [id number]  1976/09/29  Date:08/12/20  Time:6:08 PM    Provider Performing: Candee Furbish   Procedure: Pleural Catheter Insertion w/ Imaging Guidance [id number])  Indication(s) Effusion  Consent Unable to obtain consent due to emergent nature of procedure.  Anesthesia Topical only with 1% lidocaine    Time Out Verified patient identification, verified procedure, site/side was marked, verified correct patient position, special equipment/implants available, medications/allergies/relevant history reviewed, required imaging and test results available.   Sterile Technique Maximal sterile technique including full sterile barrier drape, hand hygiene, sterile gown, sterile gloves, mask, hair covering, sterile ultrasound probe cover (if used).   Procedure Description Ultrasound used to identify appropriate pleural anatomy for placement and overlying skin marked. Area of placement cleaned and draped in sterile fashion.  A 14 French pigtail pleural catheter was placed into the right pleural space using Seldinger technique. Appropriate return of fluid was obtained.  The tube was connected to atrium and placed on -20 cm H2O wall suction.   Complications/Tolerance None; patient tolerated the procedure well. Chest X-ray is ordered to verify placement.   EBL Minimal  Specimen(s) fluid

## 2020-08-12 NOTE — Consult Note (Signed)
Neurology Consultation  Reason for Consult: Altered mental status Referring Physician: Dr. Ina Homes, PCCM  CC: Altered mental status  History is obtained from: Chart review  HPI: Cameron Hale is a 44 y.o. 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 He was seen at [facility] for altered mental status on 08/09/2020 and transferred to [facility] for further evaluation of sepsis. Postprocedure he after surgery had developed staring spells and had a long history of epilepsy for which he was on Keppra-initially 500 twice daily which had been escalated subsequently to 1500 twice daily. On Monday of this week, he was noted to have a staring spell, with family thinking that he is having a seizure and remained stuporous throughout the day for which he was brought to the hospital. Blood pressures at that time were 66/40.  At baseline, runs lower blood pressures but this was lower than his baseline of systolic of 010U. EEG done at [facility] showed generalized slowing and no epileptiform discharges.  At any time, the patient was back to his baseline cognitively and had no complaints.  Dr. 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 Patient today is unable to provide any history.  Overnight had episodes of hypoglycemia as well as fluctuating blood pressures-went low as well as high as systolics of 725D. Also has been having increased work of breathing.  PCCM was consulted and they are admitting him to the ICU for closer monitoring and treatment.  Bedside RN also reports that his mental status  has been worse as he is awake but does not seem to be following commands and seems to be staring.     ROS: Unable to obtain due to altered mental status  Past Medical History:  Diagnosis Date  . Hard of hearing   . NF2 (neurofibromatosis 2) ([place])   . Pressure ulcer   . Quadriplegia ([place])   . Seizures ([place])   . Vestibular schwannoma ([place])         No family history on file.   Social History:   reports that he has never smoked. He has never used smokeless tobacco. He reports previous alcohol use. He reports that he does not use drugs.  Medications  Current Facility-Administered Medications:  .  albuterol (VENTOLIN HFA) 108 (90 Base) MCG/ACT inhaler 1-2 puff, 1-2 puff, Inhalation, Q6H PRN, Shawna Clamp, MD .  artificial tears (LACRILUBE) ophthalmic ointment 1 application, 1 application, Right Eye, Q4H PRN, Shawna Clamp, MD .  ceFEPIme (MAXIPIME) 2 g in sodium chloride 0.9 % 100 mL IVPB, 2 g, Intravenous, Q8H, Shawna Clamp, MD, Last Rate: 200 mL/hr at 08/12/20 0557, 2 g at 08/12/20 0557 .  Chlorhexidine Gluconate Cloth 2 % PADS 6 each, 6 each, Topical, Daily, Tat, Schneider, MD, 6 each at 08/11/20 1023 .  collagenase (SANTYL) ointment, , Topical, BID, Shawna Clamp, MD, Given at 08/11/20 2310 .  hydrocortisone sodium succinate (SOLU-CORTEF) 100 MG injection 100 mg, 100 mg, Intravenous, Q8H, Arrien, Jimmy Picket, MD, 100 mg at 08/12/20 0952 .  levETIRAcetam (KEPPRA) IVPB 1500 mg/ 100 mL premix, 1,500 mg, Intravenous, Q12H, Tat, [name], MD, Last Rate: 400 mL/hr at 08/12/20  4008, 1,500 mg at 08/12/20 0921 .  LORazepam (ATIVAN) injection 2 mg, 2 mg, Intravenous, Q4H PRN, Arrien, Jimmy Picket, MD, 2 mg at 08/12/20 [phone] .  metroNIDAZOLE (FLAGYL) IVPB 500 mg, 500 mg, Intravenous, Q8H, Shawna Clamp, MD, Last Rate: 100 mL/hr at 08/12/20 0939, 500 mg at 08/12/20 0939 .  ondansetron (ZOFRAN) tablet 4 mg, 4 mg, Oral, Q6H PRN **OR** ondansetron (ZOFRAN) injection 4 mg, 4 mg, Intravenous,  Q6H PRN, Arrien, Jimmy Picket, MD .  sodium chloride flush (NS) 0.9 % injection 10-40 mL, 10-40 mL, Intracatheter, Q12H, Shawna Clamp, MD, 10 mL at 08/11/20 2326 .  sodium chloride flush (NS) 0.9 % injection 10-40 mL, 10-40 mL, Intracatheter, PRN, Shawna Clamp, MD .  sodium chloride flush (NS) 0.9 % injection 10-40 mL, 10-40 mL, Intracatheter, Q12H, Shawna Clamp, MD, 10 mL at 08/11/20 2325 .  sodium chloride flush (NS) 0.9 % injection 10-40 mL, 10-40 mL, Intracatheter, PRN, Shawna Clamp, MD   Exam: Current vital signs: BP (!) 85/40   Pulse 85   Temp 98.6 F (37 C)   Resp (!) 32   Wt 65.3 kg   SpO2 94%   BMI (P) 20.66 kg/m  Vital signs in last 24 hours: Temp:  [89.9 F (32.2 C)-100.6 F (38.1 C)] 98.6 F (37 C) (09/25 0700) Pulse Rate:  [48-100] 85 (09/25 0715) Resp:  [18-46] 32 (09/25 0715) BP: (75-152)/(40-92) 85/40 (09/25 0715) SpO2:  [92 %-100 %] 94 % (09/25 0715) FiO2 (%):  [50 %] 50 % (09/24 2357) General: Sitting in bed on BiPAP. HEENT: Sequela of prior surgeries Abdomen soft nondistended nontender Extremities with trace edema Neurological exam He is awake, does not track the examiner. Does not follow commands Is nonverbal-this is changed from 3 days ago. Cranial nerves: Pupils equal round react to light, difficult to examine extraocular movements but seems to have a mild rightward gaze preference.  Face appears grossly symmetric. Motor exam: No spontaneous movement.  To noxious stimulation twitch of movement noted in the left hand. Sensory exam as above Cannot ascertain coordination.   Labs I have reviewed labs in epic and the results pertinent to this consultation are:   CBC    Component Value Date/Time   WBC 9.7 08/12/2020 0630   RBC 3.30 (L) 08/12/2020 0630   HGB 8.7 (L) 08/12/2020 0630   HCT 26.2 (L) 08/12/2020 0630   PLT 104 (L) 08/12/2020 0630   MCV 79.4 (L) 08/12/2020 0630   MCH 26.4 08/12/2020 0630   MCHC 33.2 08/12/2020 0630   RDW  17.4 (H) 08/12/2020 0630   LYMPHSABS 0.8 08/08/2020 1247   MONOABS 0.4 08/08/2020 1247   EOSABS 0.0 08/08/2020 1247   BASOSABS 0.0 08/08/2020 1247    CMP     Component Value Date/Time   NA 144 08/12/2020 0630   K 4.0 08/12/2020 0630   CL 118 (H) 08/12/2020 0630   CO2 18 (L) 08/12/2020 0630   GLUCOSE 58 (L) 08/12/2020 0630   BUN 14 08/12/2020 0630   CREATININE 0.42 (L) 08/12/2020 0630   CREATININE 0.60 12/04/2017 1258   CALCIUM 7.5 (L) 08/12/2020 0630   PROT 4.3 (L) 08/11/2020 2232   ALBUMIN 1.4 (L) 08/11/2020 2232   AST 40 08/11/2020 2232   ALT 112 (H) 08/11/2020 2232   ALKPHOS 93 08/11/2020 2232   BILITOT 0.5 08/11/2020 2232   GFRNONAA >60 08/12/2020 0630   GFRAA >60 08/12/2020 0630    Lipid Panel  No results found for: CHOL, TRIG, HDL, CHOLHDL, VLDL,  [place], LDLDIRECT   Imaging I have reviewed the images obtained:  CT-scan of the brain-08/09/2020 with no evidence of acute intracranial hemorrhage or acute demarcated cortical infarct.  Extensive meningiomatosis more fully characterize on prior comparison brain MRI of 05/24/2020.  There is a large parafalcine meningioma as well as extensive en plaque meningiomas greatest along the anterior and superior cerebral convexities with unchanged prominent vasogenic edema with underlying mid to anterior frontal lobes and crossing the corpus callosum.  As before there is herniation of the rectus gyri into the sella turcica.  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  Known right vestibular schwannoma with questionable asymmetric enlargement of the right hippocampus-also seen before on the MRI from July.  Assessment:  44 year old man with extensive neurofibromatosis 2 lesions in the brain and C-spine who is quadriplegic after a spine surgery, has a history of seizure disorder with worsening mentation-being evaluated for  sepsis. Acute change in mentation with worsening of mental status where he is aphasic and father from his baseline mentation concerning for underlying ongoing seizure activity/status epilepticus versus toxic metabolic encephalopathy.    Recommendations: Stat EEG-technologist notified Continue current dose of antiepileptics Ativan 2 mg IV x1 Fosphenytoin load.  Hold off on using phenytoin standing doses for now-decision will be based on exam and EEG findings. Obtain ABG Check ammonia TSH and B12. We will continue to follow Plan discussed with Dr. Ina Homes.   -- Amie Portland, MD [facility] Pager: [phone] If 7pm to 7am, please call on call as listed on AMION.   CRITICAL CARE ATTESTATION Performed by: Amie Portland, MD Total critical care time: 49minutes Critical care time was exclusive of separately billable procedures and treating other patients and/or supervising APPs/Residents/Students Critical care was necessary to treat or prevent imminent or life-threatening deterioration due to multifactorial toxic metabolic encephalopathy, possible status epilepticus This patient is critically ill and at significant risk for neurological worsening and/or death and care requires constant monitoring. Critical care was time spent personally by me on the following activities: development of treatment plan with patient and/or surrogate as well as nursing, discussions with consultants, evaluation of patient's response to treatment, examination of patient, obtaining history from patient or surrogate, ordering and performing treatments and interventions, ordering and review of laboratory studies, ordering and review of radiographic studies, pulse oximetry, re-evaluation of patient's condition, participation in multidisciplinary rounds and medical decision making of high complexity in the care of this patient.

## 2020-08-12 NOTE — Progress Notes (Signed)
RT came to check on patient and his sats were in the 80,s. RT took pt off the vent and bagged him for 10 minutes his RN was in the room and his sats were up to 89-90. Pt placed back on the vent and RT will continue to monitor.

## 2020-08-12 NOTE — Progress Notes (Signed)
eLink Physician-Brief Progress Note Patient Name: Cameron Hale DOB: [birth year] [name]: [id number]   Date of Service  08/12/2020  HPI/Events of Note  Notified of bradycardia on Precedex. Improved when Precedex held. Dose range ordered with a minimum of 0.4, request to decrease to 0.1  eICU Interventions  Decreased minimum dose to 0.1 and bedside RN to titrate accordingly     Intervention Category Major Interventions: Other:  Judd Lien 08/12/2020, 9:58 PM

## 2020-08-12 NOTE — Progress Notes (Signed)
Summary of events related to transfer to ICU. Upon AM assessment, pt nonverbal and not responding to RN. B/P 09'X to 83'J systolic. Bsg. Low requiring Dextrose to be given. Pt on Bipap with sats 90-94%. B/P elevated significantly. Pt with seizure events happening. MD responded as soon as notified about changes. Family were called related to transfer to ICU. Telephone report called to ICU, given to [facility]. Pt transport via bed with assist from rapid response RN. Bipap removed by RT. O2 n/c placed for transport. Pt tolerated transfer well.

## 2020-08-12 NOTE — Progress Notes (Signed)
MD notified about patient BP dropping in the 76'F systolic and more lethargic. 500cc saline bolus ordered. Rapid response nurse also notified.

## 2020-08-12 NOTE — Progress Notes (Signed)
MD notified about Pt low BP. Bipap ordered because he could not be diuresed. RT called and Bipap set up.

## 2020-08-12 NOTE — Progress Notes (Addendum)
   08/11/20 2100  Assess: MEWS Score  Temp  (unable to read)  Pulse Rate 84  ECG Heart Rate 84  Resp (!) 27  SpO2 94 %  O2 Device Non-rebreather Mask  O2 Flow Rate (L/min) 15 L/min  Assess: MEWS Score  MEWS Temp 2  MEWS Systolic 0  MEWS Pulse 0  MEWS RR 2  MEWS LOC 0  MEWS Score 4  MEWS Score Color Red  Assess: if the MEWS score is Yellow or Red  Were vital signs taken at a resting state? Yes  Focused Assessment Change from prior assessment (see assessment flowsheet)  Early Detection of Sepsis Score *See Row Information* High  MEWS guidelines implemented *See Row Information* Yes  Treat  MEWS Interventions Escalated (See documentation below)  Take Vital Signs  Increase Vital Sign Frequency  Red: Q 1hr X 4 then Q 4hr X 4, if remains red, continue Q 4hrs  Escalate  MEWS: Escalate Red: discuss with charge nurse/RN and provider, consider discussing with RRT  Notify: Charge Nurse/RN  Name of Charge Nurse/RN Notified Phil, RN  Date Charge Nurse/RN Notified 08/11/20  Time Charge Nurse/RN Notified 2103  Notify: Provider  Provider Name/Title Dr. Myna Hidalgo MD  Date Provider Notified 08/11/20  Time Provider Notified 2105  Notification Type Page  Notification Reason Change in status  Response See new orders  Date of Provider Response 08/11/20  Time of Provider Response 2110  Notify: Rapid Response  Name of Rapid Response RN Notified 2056  Date Rapid Response Notified 08/11/20  Time Rapid Response Notified 2110  Document  Patient Outcome Not stable and remains on department  MD paged paged about patient requiring non-rebreather since he could not get his O2 sats up. CXR and blood work ordered. See epic.

## 2020-08-12 NOTE — Progress Notes (Signed)
Transported pt from 3W18 to [facility] with bipap on s/b. Pt on 6L  for transport. ABG within normal limits. Pt on 8L HFNC at this time in no distress, no increased WOB. Pt is mouth breather but ABG ok. MD aware. RN will call RT if pt needs bipap again. Report given to receiving RT at bedside.

## 2020-08-12 NOTE — Procedures (Signed)
Arterial Catheter Insertion Procedure Note  Cameron Hale  [id number]  Dec 30, 1975  Date:08/12/20  Time:6:09 PM    Provider Performing: Candee Furbish    Procedure: Insertion of Arterial Line [phone]) with US guidance (72257)   Indication(s) Blood pressure monitoring and/or need for frequent ABGs  Consent Unable to obtain consent due to emergent nature of procedure.  Anesthesia None   Time Out Verified patient identification, verified procedure, site/side was marked, verified correct patient position, special equipment/implants available, medications/allergies/relevant history reviewed, required imaging and test results available.   Sterile Technique Maximal sterile technique including full sterile barrier drape, hand hygiene, sterile gown, sterile gloves, mask, hair covering, sterile ultrasound probe cover (if used).   Procedure Description Area of catheter insertion was cleaned with chlorhexidine and draped in sterile fashion. With real-time ultrasound guidance an arterial catheter was placed into the left radial artery.  Appropriate arterial tracings confirmed on monitor.     Complications/Tolerance None; patient tolerated the procedure well.   EBL Minimal   Specimen(s) None

## 2020-08-12 NOTE — Progress Notes (Signed)
EEG complete - results pending 

## 2020-08-12 NOTE — Procedures (Signed)
Intubation Procedure Note  CHADWICK REISWIG  [id number]  08-Jul-1976  Date:08/12/20  Time:6:08 PM   Provider Performing:Jaanai Salemi C Tamala Julian    Procedure: Intubation (35465)  Indication(s) Respiratory Failure  Consent Unable to obtain consent due to emergent nature of procedure.   Anesthesia None   Time Out Verified patient identification, verified procedure, site/side was marked, verified correct patient position, special equipment/implants available, medications/allergies/relevant history reviewed, required imaging and test results available.   Sterile Technique Usual hand hygeine, masks, and gloves were used   Procedure Description Patient positioned in bed supine.  Sedation given as noted above.  Patient was intubated with endotracheal tube using Glidescope.  View was Grade 1 full glottis .  Number of attempts was 1.  Colorimetric CO2 detector was consistent with tracheal placement.   Complications/Tolerance None; patient tolerated the procedure well. Chest X-ray is ordered to verify placement.   EBL Minimal   Specimen(s) None

## 2020-08-12 NOTE — Progress Notes (Signed)
MD updated about patient's BP after bolus given. Came up slightly but in the 80's. RN got ordered for albumin. See epic. RT and RRRN notified and at the bedside.

## 2020-08-12 NOTE — Consult Note (Signed)
NAME:  Cameron Hale, [name]:  [id number], DOB:  1975-12-15, LOS: 4 ADMISSION DATE:  08/08/2020, CONSULTATION DATE:  08/12/20 REFERRING MD:  Opyd, CHIEF COMPLAINT:  unresponsive   Brief History   44 year old man with quadriplegia, seizure disorder presenting with suspected recurrent seizures, hypoxemia, and hypotension.  History of present illness   44 year old man with hx of type 2 neurofibromatosis, quadriplegia, blindness, hearing loss (baseline only able to talk) presenting with recurrent unresponsive episodes as well as generalized seizure activity.  Brought to [facility] for further management.  Unfortunately less responsive this AM with hypotension and hypoxemia so PCCM consulted.  Patient unable to provide any history.  Normally followed by [facility] but due to instability was brought into [place].  Past Medical History  -Neurofibromatosis 2 -Neurosurgical intervention March 2021 with postoperative course complicated by functional quadriplegia, blindness, hearing loss (baseline only able to talk)  Decubitus pressue ulcer   Significant Hospital Events   9/21 admitted to [place]:  Neurology  Procedures:  9/21 EEG no seizures  Significant Diagnostic Tests:  CT Brain 9/21 IMPRESSION: No evidence of acute intracranial hemorrhage or acute demarcated cortical infarct.  Extensive meningiomatosis more fully characterized on the recent prior brain MRI of 05/24/2020. Most notably, this includes a large parafalcine meningioma as well as extensive en plaque meningiomas greatest along the anterior and superior cerebral convexities. Unchanged prominent vasogenic edema within the underlying mid-to-anterior frontal lobes and crossing the corpus callosum. As before, there is herniation of the rectus gyri into the sella turcica.  Redemonstrated small foci of calcification along the right greater than left intraorbital optic nerve sheaths, likely reflecting small optic nerve sheath  meningiomas.  A known right vestibular schwannoma was better appreciated on the prior MRI.  Questionable asymmetric enlargement of the right hippocampus was also better appreciated on the prior MRI.  Micro Data:  COVID neg HIV neg Blood culture neg UA equivocal  Antimicrobials:  Cefepime, flagyl, vanc>>   Interim history/subjective:  Was back to baseline cognition 9/24, unfortunately now unresponsive  Objective   Blood pressure (!) 85/40, pulse 85, temperature 98.6 F (37 C), resp. rate (!) 32, weight 65.3 kg, SpO2 94 %.    FiO2 (%):  [50 %] 50 %   Intake/Output Summary (Last 24 hours) at 08/12/2020 0954 Last data filed at 08/12/2020 0654 Gross per 24 hour  Intake 2847.83 ml  Output 800 ml  Net 2047.83 ml   Filed Weights   08/08/20 1430  Weight: 65.3 kg    Examination: Constitutional: chronically ill man in no acute distress Eyes: eyes are anicteric, reactive to light, mild nystagmus noted, no blink to threat Ears, nose, mouth, and throat: mucous membranes moist, trachea midline Cardiovascular: heart sounds are regular, ext are warm to touch. Trace anasarca Respiratory: Diminished at bases, no wheezing Gastrointestinal: abdomen is soft with + BS Skin: No rashes, normal turgor Neurologic: quadriplegic Psychiatric: Cannot assess  Sodium slightly high Cr slightly up from baseline CBC stable Glucose low  Resolved Hospital Problem list   N/A  Assessment & Plan:  Acute metabolic encephalopathy- question recurrent seizure, hypoglycemic state or combination thereof.  Baseline can talk but not much else after his NF2 surgery. - CBG q4h, D50 PRN - Check lactate, CPK, repeat UA, ABG r/o hypercarbia - Spot EEG, neurology seeing, ativan x 1, AED titration at their discretion  Acute hypoxemic respiratory failure- due to volume overloaded state of heart, third spacing, question of right lung pneumonia - Continue  broad spectrum abx - On BIPAP at present - Recheck  CXR  Labile Bps- question autonomic dysfunction vs. Continued distributive shock - Already on stress steroids, still hypoglycemic despite this which is odd - May need line + pressors, would prefer PICC but can do CVL PRN   Best practice:  Diet: NPO Pain/Anxiety/Delirium protocol (if indicated): None VAP protocol (if indicated): N/A DVT prophylaxis: lovenox GI prophylaxis: PPI Glucose control: q4h checks Mobility: BR Code Status: full Family Communication: will update when they come in Disposition: ICU, high risk for intubation   Medical Decision Making    Diagnoses that are immediately life threatening include acute hypoxemic respiratory failure Critical test findings: unresponsive clinical state, hypoxemia despite supplemental oxygen Interventions today to address these diagnoses are noted above Likelihood of life-threatening deterioration without intervention is high.  Labs   CBC: Recent Labs  Lab 08/08/20 1247 08/09/20 0449 08/11/20 0625 08/11/20 2232 08/12/20 0630  WBC 10.3 11.0* 8.6 10.0 9.7  NEUTROABS 8.8*  --   --   --   --   HGB 9.5* 9.9* 10.7* 10.7* 8.7*  HCT 30.3* 31.4* 33.5* 32.9* 26.2*  MCV 85.6 83.3 82.1 79.1* 79.4*  PLT 113* 144* 83* 119* 104*    Basic Metabolic Panel: Recent Labs  Lab 08/08/20 1247 08/09/20 0449 08/11/20 0625 08/11/20 2232 08/12/20 0630  NA 141 142 141 144 144  K 3.7 3.5 2.7* 4.3 4.0  CL 111 111 113* 115* 118*  CO2 25 24 22 22  18*  GLUCOSE 62* 108* 184* 121* 58*  BUN 23* 17 12 12 14   CREATININE <0.30* <0.30* <0.30* <0.30* 0.42*  CALCIUM 7.7* 8.4* 8.4* 8.4* 7.5*  MG  --   --  1.8 1.6*  --   PHOS  --   --  2.4*  --  3.6   GFR: Estimated Creatinine Clearance: 107.7 mL/min (A) (by C-G formula based on SCr of 0.42 mg/dL (L)). Recent Labs  Lab 08/08/20 1247 08/08/20 1247 08/08/20 1403 08/09/20 0449 08/11/20 0625 08/11/20 2232 08/12/20 0630  PROCALCITON  --   --   --   --   --  0.10  --   WBC 10.3   < >  --  11.0* 8.6  10.0 9.7  LATICACIDVEN 0.8  --  0.6  --   --  1.7  --    < > = values in this interval not displayed.    Liver Function Tests: Recent Labs  Lab 08/08/20 1247 08/11/20 0625 08/11/20 2232  AST 163* 66* 40  ALT 233* 122* 112*  ALKPHOS 102 89 93  BILITOT 0.3 0.4 0.5  PROT 4.4* 4.3* 4.3*  ALBUMIN 1.7* 1.4* 1.4*   No results for input(s): LIPASE, AMYLASE in the last 168 hours. No results for input(s): AMMONIA in the last 168 hours.  ABG    Component Value Date/Time   PHART 7.367 08/08/2020 1525   PCO2ART 40.9 08/08/2020 1525   PO2ART 85.9 08/08/2020 1525   HCO3 23.1 08/08/2020 1525   ACIDBASEDEF 0.9 08/08/2020 1525   O2SAT 97.4 08/08/2020 1525     Coagulation Profile: Recent Labs  Lab 08/08/20 1403  INR 1.0    Cardiac Enzymes: Recent Labs  Lab 08/08/20 1320  CKTOTAL 30*    HbA1C: No results found for: HGBA1C  CBG: Recent Labs  Lab 08/08/20 1544 08/12/20 0804 08/12/20 0926  GLUCAP 122* 68* 198*    Review of Systems:   Cannot assess, aphasic  Past Medical History  He,  has a  past medical history of Hard of hearing, NF2 (neurofibromatosis 2) ([place]), Pressure ulcer, Quadriplegia ([place]), Seizures ([place]), and Vestibular schwannoma ([place]).   Surgical History    Past Surgical History:  Procedure Laterality Date  . brain bleed     mri 2009  . HAND EXPLORATION    . LUMBAR FUSION    . NEPHRECTOMY     Partial left nephrectomy   . SPINAL FUSION       Social History   reports that he has never smoked. He has never used smokeless tobacco. He reports previous alcohol use. He reports that he does not use drugs.   Family History   His family history is not on file.   Allergies Allergies  Allergen Reactions  . Amoxicillin Itching  . Morphine Itching     Home Medications  Prior to Admission medications   Medication Sig Start Date End Date Taking? Authorizing Provider  acetaminophen (TYLENOL) 325 MG tablet Take 2 tablets (650 mg total) by mouth every 6  (six) hours as needed for mild pain or fever. 06/05/20  Yes Angiulli, Lavon Paganini, PA-C  baclofen (LIORESAL) 10 MG tablet Take 1 tablet (10 mg total) by mouth 2 (two) times daily. 06/14/20  Yes Lovorn, Megan, MD  brigatinib (ALUNBRIG) 90 & 180 MG TBPK Take 90-180 mg by mouth daily.  06/07/20  Yes [provider]  carboxymethylcellulose (REFRESH TEARS) 0.5 % SOLN Place 1 drop into both eyes daily as needed (For dry eyes).   Yes [provider]  ciprofloxacin (CIPRO) 500 MG tablet Take 500 mg by mouth 2 (two) times daily.  08/01/20  Yes [provider]  dexamethasone (DECADRON) 2 MG tablet Take 2 mg by mouth daily.  06/21/20  Yes [provider]  famotidine (PEPCID) 20 MG tablet Take 1 tablet (20 mg total) by mouth 2 (two) times daily. 07/21/20  Yes Lovorn, Jinny Blossom, MD  levETIRAcetam (KEPPRA) 500 MG tablet Take 3 tablets (1,500 mg total) by mouth 2 (two) times daily. 06/05/20 08/08/20 Yes Angiulli, Lavon Paganini, PA-C  propranolol (INDERAL) 10 MG tablet Take 1 tablet (10 mg total) by mouth 2 (two) times daily. 06/05/20  Yes Angiulli, Lavon Paganini, PA-C  ammonium lactate (AMLACTIN) 12 % lotion Apply 1 application topically as needed for dry skin. Patient not taking: Reported on 08/08/2020 06/28/20   Felipa Furnace, DPM  bisacodyl (DULCOLAX) 10 MG suppository Place 1 suppository (10 mg total) rectally daily at 6 PM. Patient not taking: Reported on 08/08/2020 06/05/20   Cathlyn Parsons, PA-C     Critical care time: 45 minutes not including any separately billable procedures

## 2020-08-12 NOTE — Progress Notes (Addendum)
MD instructed RN to d/c IV fluid because pt's CXR showed Pulm. Edema and pleural eff.

## 2020-08-12 NOTE — Progress Notes (Signed)
eLink Physician-Brief Progress Note Patient Name: BENIAH MAGNAN DOB: [birth year] [name]: [id number]   Date of Service  08/12/2020  HPI/Events of Note  Notified of ABG result 7.3/36/51 when corrected for temp of 32.6 its 7.42/24/41  On PRVC 16/580/100%/5 PEEP  eICU Interventions  Increase PEEP to 10     Intervention Category Major Interventions: Respiratory failure - evaluation and management  Judd Lien 08/12/2020, 8:25 PM

## 2020-08-12 NOTE — Progress Notes (Addendum)
Patient was seen for increased oxygen requirement. Chart was reviewed, patient examined, and case discussed with his RN at bedside.   He has hx of neurofibromatosis type II, seizures, and quadriplegia who was admitted 9/21 with seizures and sepsis suspected secondary to aspiration pneumonia, has been on broad spectrum antibiotics and supplemental O2, and his beta-blocker has been held due to bradycardia and hypotension.   Was alerted that his oxygen requirement has increased from 3 Lpm to 15 Lpm.   He appears lethargic, RR mid 20s, SBP 90, HR 90s, saturation low-mid 90s on 15 Lpm NRB.    CXR was obtained and notable for right pleural effusion and suspected pulmonary edema. Labs were obtained with normal lactate, procalcitonin 0.10, magnesium 1.6, stable anemia, and improved thrombocytopenia.   Maintenance IVF was stopped and diuresis considered but SBP now in 80s and he is not in acute respiratory distress so was felt more prudent to hold off on that for now.   ADDENDUM:  BP continues to trend down with SBP 80 now, MAP 59. Discussed with PCCM, his expertise much appreciated, planning to give albumin now.

## 2020-08-13 ENCOUNTER — Inpatient Hospital Stay (HOSPITAL_COMMUNITY): Payer: Medicare Other

## 2020-08-13 DIAGNOSIS — J9601 Acute respiratory failure with hypoxia: Secondary | ICD-10-CM

## 2020-08-13 LAB — BASIC METABOLIC PANEL
Anion gap: 14 (ref 5–15)
Anion gap: 13 (ref 5–15)
BUN: 18 mg/dL (ref 6–20)
BUN: 18 mg/dL (ref 6–20)
CO2: 17 mmol/L — ABNORMAL LOW (ref 22–32)
CO2: 15 mmol/L — ABNORMAL LOW (ref 22–32)
Calcium: 8.5 mg/dL — ABNORMAL LOW (ref 8.9–10.3)
Calcium: 8.2 mg/dL — ABNORMAL LOW (ref 8.9–10.3)
Chloride: 117 mmol/L — ABNORMAL HIGH (ref 98–111)
Chloride: 120 mmol/L — ABNORMAL HIGH (ref 98–111)
Creatinine, Ser: 0.53 mg/dL — ABNORMAL LOW (ref 0.61–1.24)
Creatinine, Ser: 0.62 mg/dL (ref 0.61–1.24)
GFR calc Af Amer: >60 mL/min (ref >60)
GFR calc Af Amer: >60 mL/min (ref >60)
GFR calc non Af Amer: >60 mL/min (ref >60)
GFR calc non Af Amer: >60 mL/min (ref >60)
Glucose, Bld: 93 mg/dL (ref 70–99)
Glucose, Bld: 69 mg/dL — ABNORMAL LOW (ref 70–99)
Potassium: 2.7 mmol/L — CL (ref 3.5–5.1)
Potassium: 3.6 mmol/L (ref 3.5–5.1)
Sodium: 148 mmol/L — ABNORMAL HIGH (ref 135–145)
Sodium: 148 mmol/L — ABNORMAL HIGH (ref 135–145)

## 2020-08-13 LAB — GLUCOSE, CAPILLARY
Glucose-Capillary: 83 mg/dL (ref 70–99)
Glucose-Capillary: 70 mg/dL (ref 70–99)
Glucose-Capillary: 66 mg/dL — ABNORMAL LOW (ref 70–99)
Glucose-Capillary: 113 mg/dL — ABNORMAL HIGH (ref 70–99)
Glucose-Capillary: 89 mg/dL (ref 70–99)
Glucose-Capillary: 95 mg/dL (ref 70–99)

## 2020-08-13 LAB — POCT I-STAT 7, (LYTES, BLD GAS, ICA,H+H)
Acid-base deficit: 2 mmol/L (ref 0.0–2.0)
Bicarbonate: 20.5 mmol/L (ref 20.0–28.0)
Calcium, Ion: 1.24 mmol/L (ref 1.15–1.40)
HCT: 31 % — ABNORMAL LOW (ref 39.0–52.0)
Hemoglobin: 10.5 g/dL — ABNORMAL LOW (ref 13.0–17.0)
O2 Saturation: 100 %
Patient temperature: 36.1
Potassium: 2.7 mmol/L — CL (ref 3.5–5.1)
Sodium: 150 mmol/L — ABNORMAL HIGH (ref 135–145)
TCO2: 21 mmol/L — ABNORMAL LOW (ref 22–32)
pCO2 arterial: 26.4 mmHg — ABNORMAL LOW (ref 32.0–48.0)
pH, Arterial: 7.494 — ABNORMAL HIGH (ref 7.350–7.450)
pO2, Arterial: 329 mmHg — ABNORMAL HIGH (ref 83.0–108.0)

## 2020-08-13 LAB — CBC
HCT: 32.1 % — ABNORMAL LOW (ref 39.0–52.0)
Hemoglobin: 10.4 g/dL — ABNORMAL LOW (ref 13.0–17.0)
MCH: 25.7 pg — ABNORMAL LOW (ref 26.0–34.0)
MCHC: 32.4 g/dL (ref 30.0–36.0)
MCV: 79.5 fL — ABNORMAL LOW (ref 80.0–100.0)
Platelets: 120 10*3/uL — ABNORMAL LOW (ref 150–400)
RBC: 4.04 MIL/uL — ABNORMAL LOW (ref 4.22–5.81)
RDW: 17.7 % — ABNORMAL HIGH (ref 11.5–15.5)
WBC: 17.8 10*3/uL — ABNORMAL HIGH (ref 4.0–10.5)
nRBC: 0.1 % (ref 0.0–0.2)

## 2020-08-13 LAB — PHOSPHORUS: Phosphorus: 3.2 mg/dL (ref 2.5–4.6)

## 2020-08-13 LAB — TRIGLYCERIDES: Triglycerides: 56 mg/dL (ref <150)

## 2020-08-13 LAB — CULTURE, BLOOD (ROUTINE X 2)
Culture: NO GROWTH
Culture: NO GROWTH

## 2020-08-13 LAB — MAGNESIUM: Magnesium: 2.5 mg/dL — ABNORMAL HIGH (ref 1.7–2.4)

## 2020-08-13 MED ORDER — METOLAZONE 5 MG PO TABS
5.0000 mg | ORAL_TABLET | Freq: Once | ORAL | Status: AC
  Administered 2020-08-13: 5 mg via ORAL
  Filled 2020-08-13: qty 1

## 2020-08-13 MED ORDER — FREE WATER
200.0000 mL | Freq: Four times a day (QID) | Status: DC
  Administered 2020-08-13 – 2020-08-15 (×7): 200 mL

## 2020-08-13 MED ORDER — DEXTROSE 50 % IV SOLN
12.5000 g | INTRAVENOUS | Status: AC
  Administered 2020-08-13: 12.5 g via INTRAVENOUS

## 2020-08-13 MED ORDER — POTASSIUM CHLORIDE 10 MEQ/50ML IV SOLN
10.0000 meq | INTRAVENOUS | Status: DC
  Administered 2020-08-13 (×3): 10 meq via INTRAVENOUS
  Filled 2020-08-13 (×2): qty 50

## 2020-08-13 MED ORDER — ONDANSETRON HCL 4 MG/2ML IJ SOLN: 4.0000 mg | Freq: Four times a day (QID) | INTRAMUSCULAR | Status: DC | PRN

## 2020-08-13 MED ORDER — PROPOFOL 1000 MG/100ML IV EMUL
5.0000 ug/kg/min | INTRAVENOUS | Status: DC
  Administered 2020-08-13: 5 ug/kg/min via INTRAVENOUS
  Administered 2020-08-14: 20 ug/kg/min via INTRAVENOUS
  Filled 2020-08-13 (×2): qty 100

## 2020-08-13 MED ORDER — ONDANSETRON HCL 4 MG PO TABS
4.0000 mg | ORAL_TABLET | Freq: Four times a day (QID) | ORAL | Status: DC | PRN
  Filled 2020-08-13: qty 1

## 2020-08-13 MED ORDER — DEXTROSE 50 % IV SOLN
INTRAVENOUS | Status: AC
  Filled 2020-08-13: qty 50

## 2020-08-13 MED ORDER — STROKE: EARLY STAGES OF RECOVERY BOOK
Freq: Once | Status: AC
  Filled 2020-08-13: qty 1

## 2020-08-13 MED ORDER — MIDODRINE HCL 5 MG PO TABS: 10.0000 mg | ORAL_TABLET | Freq: Three times a day (TID) | ORAL | Status: DC

## 2020-08-13 MED ORDER — MIDODRINE HCL 5 MG PO TABS
5.0000 mg | ORAL_TABLET | Freq: Three times a day (TID) | ORAL | Status: DC
  Administered 2020-08-13: 5 mg via ORAL
  Filled 2020-08-13: qty 1

## 2020-08-13 MED ORDER — METHYLPREDNISOLONE SODIUM SUCC 125 MG IJ SOLR
60.0000 mg | Freq: Two times a day (BID) | INTRAMUSCULAR | Status: DC
  Administered 2020-08-13 – 2020-08-16 (×8): 60 mg via INTRAVENOUS
  Filled 2020-08-13 (×8): qty 2

## 2020-08-13 MED ORDER — SODIUM CHLORIDE 0.9 % IV SOLN
2.0000 g | INTRAVENOUS | Status: AC
  Administered 2020-08-13: 2 g via INTRAVENOUS
  Filled 2020-08-13: qty 2

## 2020-08-13 MED ORDER — MIDODRINE HCL 5 MG PO TABS
5.0000 mg | ORAL_TABLET | Freq: Three times a day (TID) | ORAL | Status: DC
  Administered 2020-08-13 – 2020-08-15 (×6): 5 mg
  Filled 2020-08-13 (×7): qty 1

## 2020-08-13 MED ORDER — DEXTROSE 10 % IV SOLN: INTRAVENOUS | Status: DC

## 2020-08-13 MED ORDER — POTASSIUM CHLORIDE 20 MEQ/15ML (10%) PO SOLN
40.0000 meq | ORAL | Status: AC
  Administered 2020-08-13 (×2): 40 meq via ORAL
  Filled 2020-08-13 (×2): qty 30

## 2020-08-13 MED ORDER — POTASSIUM CHLORIDE 20 MEQ/15ML (10%) PO SOLN
40.0000 meq | Freq: Once | ORAL | Status: AC
  Administered 2020-08-13: 40 meq
  Filled 2020-08-13: qty 30

## 2020-08-13 MED ORDER — HYDROCORTISONE 10 MG PO TABS: 10.0000 mg | ORAL_TABLET | Freq: Every evening | ORAL | Status: DC

## 2020-08-13 MED ORDER — HYDROCORTISONE 20 MG PO TABS: 20.0000 mg | ORAL_TABLET | Freq: Every day | ORAL | Status: DC

## 2020-08-13 NOTE — Progress Notes (Signed)
Hypoglycemic Event  CBG: 66  Treatment: 1/2 amp of Dex pushed   Symptoms: none  Follow-up CBG: Time:1257 CBG Result:113  Possible Reasons for Event: NPO     Cameron Hale M [name]

## 2020-08-13 NOTE — Progress Notes (Signed)
Patient transported to MRI and back without complications. RN at bedside. 

## 2020-08-13 NOTE — Progress Notes (Signed)
EEG with no sz. DC LTM

## 2020-08-13 NOTE — Progress Notes (Signed)
LTM maint complete - no skin breakdown under: F3 A1 F7 Fp1

## 2020-08-13 NOTE — Progress Notes (Addendum)
Pharmacy Antibiotic Note  Cameron Hale is a 44 y.o. male with PMH significant for neurofibromatosis admitted on 08/08/2020 due to unresponsiveness and sepsis.  Pharmacy has been consulted for Zosyn dosing for possible aspiration pneumonia.   Patient initially started on vancomycin, cefepime and metronidazole for unknown source of infection. Concern for PNA given opacities on CXR. MRSA PCR resulted negative, and vancomycin was discontinued. Critical care team consolidating from cefepime and metronidazole to ceftriaxone to complete 7 days.   Plan: Discontinue cefepime and metronidazole Initiate ceftriaxone 2g daily x 2 days  Monitor labs, c/s, and signs of clinical improvement   Height: 5\' 10"  (177.8 cm) Weight: 72.1 kg (159 lb) IBW/kg (Calculated) : 73  Temp (24hrs), Avg:94.7 F (34.8 C), Min:90.3 F (32.4 C), Max:97.7 F (36.5 C)  Recent Labs  Lab 08/08/20 1247 08/08/20 1247 08/08/20 1403 08/09/20 0449 08/11/20 0625 08/11/20 2232 08/12/20 0630 08/12/20 1207 08/13/20 0415  WBC 10.3   < >  --  11.0* 8.6 10.0 9.7  --  17.8*  CREATININE <0.30*   < >  --  <0.30* <0.30* <0.30* 0.42*  --  0.53*  LATICACIDVEN 0.8  --  0.6  --   --  1.7  --  0.7  --    < > = values in this interval not displayed.    Estimated Creatinine Clearance: 118.9 mL/min (A) (by C-G formula based on SCr of 0.53 mg/dL (L)).    Allergies  Allergen Reactions  . Amoxicillin Itching  . Morphine Itching    Antimicrobials this admission: Vanco 9/21 >> 9/25 Cefepime 9/21 >> 9/26 Flagyl 9/21 >> 9/26 Ceftriaxone 9/26>> (9/27)  Microbiology results: 9/21 BCx: ngtd 9/21 UCx: ngf 9/25 Pleural fluid cx: ngtd (likely transudate)  Thank you for allowing [place] to participate in this patients care.   Alfonse Spruce, PharmD PGY2 ID Pharmacy Resident Phone between 7 am - 3:30 pm: [phone]  Please check AMION for all [place] phone numbers After 10:00 PM, call [place] [phone]  08/13/2020 10:09 AM

## 2020-08-13 NOTE — Progress Notes (Signed)
Patient taken to CT with RN and back to Room 2H11 without any complications.

## 2020-08-13 NOTE — Progress Notes (Signed)
LTM EEG discontinued - no skin breakdown at unhook.   

## 2020-08-13 NOTE — Progress Notes (Signed)
NAME:  Cameron Hale, [name]:  [id number], DOB:  Mar 09, 1976, LOS: 5 ADMISSION DATE:  08/08/2020, CONSULTATION DATE:  08/12/20 REFERRING MD:  Opyd, CHIEF COMPLAINT:  unresponsive   Brief History   44 year old man with quadriplegia, seizure disorder presenting with suspected recurrent seizures, hypoxemia, and hypotension.  History of present illness   44 year old man with hx of type 2 neurofibromatosis, quadriplegia, blindness, hearing loss (baseline only able to talk) presenting with recurrent unresponsive episodes as well as generalized seizure activity.  Brought to [facility] for further management.  Unfortunately less responsive this AM with hypotension and hypoxemia so PCCM consulted.  Patient unable to provide any history.  Normally followed by [facility] but due to instability was brought into [place].  Past Medical History  -Neurofibromatosis 2 -Neurosurgical intervention March 2021 with postoperative course complicated by functional quadriplegia, blindness, hearing loss (baseline only able to talk)  Decubitus pressue ulcer   Significant Hospital Events   9/21 admitted to [facility] 9/25 ETT, PICC, arterial line, right chest tube  Consults:  Neurology  Procedures:  9/21 EEG no seizures  Significant Diagnostic Tests:  CT Brain 9/21 IMPRESSION: No evidence of acute intracranial hemorrhage or acute demarcated cortical infarct.  Extensive meningiomatosis more fully characterized on the recent prior brain MRI of 05/24/2020. Most notably, this includes a large parafalcine meningioma as well as extensive en plaque meningiomas greatest along the anterior and superior cerebral convexities. Unchanged prominent vasogenic edema within the underlying mid-to-anterior frontal lobes and crossing the corpus callosum. As before, there is herniation of the rectus gyri into the sella turcica.  Redemonstrated small foci of calcification along the right greater than left intraorbital optic nerve sheaths,  likely reflecting small optic nerve sheath meningiomas.  A known right vestibular schwannoma was better appreciated on the prior MRI.  Questionable asymmetric enlargement of the right hippocampus was also better appreciated on the prior MRI.  Micro Data:  COVID neg HIV neg Blood culture neg UA equivocal  Antimicrobials:  Cefepime, flagyl, vanc>>   Interim history/subjective:  No events. Opened eyes per his mother at bedside.  Objective   Blood pressure 93/80, pulse (!) 59, temperature (!) 96.8 F (36 C), temperature source Axillary, resp. rate 16, height 5\' 10"  (1.778 m), weight 72.1 kg, SpO2 100 %.    Vent Mode: PRVC FiO2 (%):  [60 %-100 %] 60 % Set Rate:  [16 bmp] 16 bmp Vt Set:  [580 mL] 580 mL PEEP:  [5 cmH20-10 cmH20] 10 cmH20 Plateau Pressure:  [19 cmH20-22 cmH20] 20 cmH20   Intake/Output Summary (Last 24 hours) at 08/13/2020 0946 Last data filed at 08/13/2020 0900 Gross per 24 hour  Intake 854.45 ml  Output 3535 ml  Net -2680.55 ml   Filed Weights   08/08/20 1430 08/13/20 0500  Weight: 65.3 kg 72.1 kg    Examination: Constitutional: chronically ill man in no acute distress Eyes: eyes are anicteric, reactive to light, mild nystagmus noted, no blink to threat Ears, nose, mouth, and throat: mucous membranes moist, trachea midline Cardiovascular: heart sounds are regular, ext are warm to touch. Trace anasarca, muscle wasting. Respiratory: Diminished at bases, no wheezing Gastrointestinal: abdomen is soft with + BS Skin: No rashes, normal turgor Neurologic: quadriplegic, will not open eyes for me Psychiatric: Cannot assess  Sodium slightly high Cr slightly up from baseline CBC stable Glucose remains low  Resolved Hospital Problem list   N/A  Assessment & Plan:  Acute metabolic encephalopathy, Persistent hypoglycemia- seizures ruled out by  repeated EEGs.  Question hypoglycemia.  Question of new stroke on CT head.  Baseline able to speak but blind and  hard of hearing, functionally quadriplegic. - Continue TF, add D10 to keep sugars 100-180 - Switch hydrocortisone to methylprednisolone to help with sugars a bit, watch BPs - MRI brain today - Limit sedation as able  Acute hypoxemic respiratory failure- due to volume overloaded state of heart, third spacing, question of bilateral pneumonitis.  Patient was on brigatinib as off-label trial which has 5% incidence of pneumonitis but I think given his encephalopathy and imaging findings he is more aspiration pneumonitis and third spacing due to poor baseline nutritional status.  Another possibility is progressive neurofibromas affecting secretion clearance and diaphragm strength. - Vent support, VAP prevention bundle - Switch to zosyn, finish 7 day course - Trial of methylprednisolone to treat any contribution of the brigatinib - Keep event to dry  Hypernatremia, anasarca- free water, metolazone x 1  Labile blood pressures- suspect autonomic instability related to heavy NF2 burden on spinal cord. - Start midodrine  R pleural effusion- probably benign transudate from poor oncotic pressure but is neutrophil predominant, regardless seems to have drained well - Continue chest tube for now, recheck CXR  NF2   Best practice:  Diet: NPO Pain/Anxiety/Delirium protocol (if indicated): None VAP protocol (if indicated): N/A DVT prophylaxis: lovenox GI prophylaxis: PPI Glucose control: q4h checks Mobility: BR Code Status: full Family Communication: updated mother at bedside Disposition: ICU   The patient is critically ill with multiple organ systems failure and requires high complexity decision making for assessment and support, frequent evaluation and titration of therapies, application of advanced monitoring technologies and extensive interpretation of multiple databases. Critical Care Time devoted to patient care services described in this note independent of APP/resident time (if applicable)   is 45 minutes.   Erskine Emery MD [facility] 08/13/2020 10:12 AM Personal pager: [phone] If unanswered, please page CCM On-call: [phone]

## 2020-08-13 NOTE — Progress Notes (Addendum)
Neurology Progress Note   S:// Patient seen and examined this morning. Required intubation yesterday emergently due to inability to protect his airway-was in atrial fibrillation, saturating in 70s with blood pressures running in 70s over 40s taking shallow breaths and was unresponsive. He was intubated without sedation with no cough or gag noted.   O:// Current vital signs: BP 94/68   Pulse 65   Temp (!) 97.5 F (36.4 C)   Resp 17   Ht '5\' 10"'  (1.778 m)   Wt 72.1 kg   SpO2 100%   BMI 22.81 kg/m  Vital signs in last 24 hours: Temp:  [90.3 F (32.4 C)-97.7 F (36.5 C)] 97.5 F (36.4 C) (09/26 0730) Pulse Rate:  [33-144] 65 (09/26 0730) Resp:  [0-30] 17 (09/26 0730) BP: (64-234)/(42-184) 94/68 (09/26 0730) SpO2:  [76 %-100 %] 100 % (09/26 0730) Arterial Line BP: (81-175)/(53-106) 111/65 (09/26 0730) FiO2 (%):  [80 %-100 %] 80 % (09/26 0626) Weight:  [72.1 kg] 72.1 kg (09/26 0500) Neurological exam On minimal sedation with Precedex At baseline no spontaneous movements and to noxious stimulation could not elicit any movements either. Pupils are equal round reactive light He is breathing with the ventilator at this time Has a weak cough and gag on suctioning Overnight reportedly had some shoulder movements.  Medications  Current Facility-Administered Medications:  .  albuterol (VENTOLIN HFA) 108 (90 Base) MCG/ACT inhaler 1-2 puff, 1-2 puff, Inhalation, Q6H PRN, Shawna Clamp, MD .  artificial tears (LACRILUBE) ophthalmic ointment 1 application, 1 application, Right Eye, Q4H PRN, Shawna Clamp, MD .  ceFEPIme (MAXIPIME) 2 g in sodium chloride 0.9 % 100 mL IVPB, 2 g, Intravenous, Q8H, Shawna Clamp, MD, Stopped at 08/13/20 0543 .  chlorhexidine gluconate (MEDLINE KIT) (PERIDEX) 0.12 % solution 15 mL, 15 mL, Mouth Rinse, BID, Candee Furbish, MD, 15 mL at 08/12/20 2000 .  Chlorhexidine Gluconate Cloth 2 % PADS 6 each, 6 each, Topical, Daily, Tat, Ranger, MD, 6 each at 08/12/20  1001 .  collagenase (SANTYL) ointment, , Topical, BID, Shawna Clamp, MD, Given at 08/12/20 2206 .  dexmedetomidine (PRECEDEX) 400 MCG/100ML (4 mcg/mL) infusion, 0.1-1.2 mcg/kg/hr, Intravenous, Titrated, Aventura, Emily T, MD, Last Rate: 3.27 mL/hr at 08/13/20 0600, 0.2 mcg/kg/hr at 08/13/20 0600 .  enoxaparin (LOVENOX) injection 40 mg, 40 mg, Subcutaneous, Q24H, Candee Furbish, MD, 40 mg at 08/12/20 1106 .  feeding supplement (VITAL HIGH PROTEIN) liquid 1,000 mL, 1,000 mL, Per Tube, Q24H, Candee Furbish, MD .  fentaNYL (SUBLIMAZE) 100 MCG/2ML injection, , , ,  .  fentaNYL (SUBLIMAZE) injection 50 mcg, 50 mcg, Intravenous, Q1H PRN, Candee Furbish, MD, 50 mcg at 08/13/20 0510 .  hydrocortisone sodium succinate (SOLU-CORTEF) 100 MG injection 100 mg, 100 mg, Intravenous, Q8H, Arrien, Jimmy Picket, MD, 100 mg at 08/13/20 0153 .  levETIRAcetam (KEPPRA) IVPB 1500 mg/ 100 mL premix, 1,500 mg, Intravenous, Q12H, Tat, Bradey, MD, Last Rate: 400 mL/hr at 08/13/20 0630, 1,500 mg at 08/13/20 0630 .  LORazepam (ATIVAN) injection 2 mg, 2 mg, Intravenous, Q4H PRN, Arrien, Jimmy Picket, MD, 2 mg at 08/12/20 [phone] .  MEDLINE mouth rinse, 15 mL, Mouth Rinse, 10 times per day, Candee Furbish, MD, 15 mL at 08/13/20 0631 .  metroNIDAZOLE (FLAGYL) IVPB 500 mg, 500 mg, Intravenous, Q8H, Shawna Clamp, MD, Stopped at 08/13/20 0300 .  midazolam (VERSED) 2 MG/2ML injection, , , ,  .  ondansetron (ZOFRAN) tablet 4 mg, 4 mg, Oral, Q6H PRN **OR** ondansetron (ZOFRAN) injection 4  mg, 4 mg, Intravenous, Q6H PRN, Arrien, Jimmy Picket, MD .  pantoprazole (PROTONIX) injection 40 mg, 40 mg, Intravenous, QHS, Candee Furbish, MD, 40 mg at 08/12/20 2139 .  potassium chloride 10 mEq in 50 mL *CENTRAL LINE* IVPB, 10 mEq, Intravenous, Q1 Hr x 4, Aventura, Emily T, MD, Last Rate: 50 mL/hr at 08/13/20 0651, 10 mEq at 08/13/20 0651 .  sodium chloride flush (NS) 0.9 % injection 10-40 mL, 10-40 mL, Intracatheter, Q12H, Shawna Clamp, MD, 10 mL at 08/12/20 2139 .  sodium chloride flush (NS) 0.9 % injection 10-40 mL, 10-40 mL, Intracatheter, PRN, Shawna Clamp, MD .  sodium chloride flush (NS) 0.9 % injection 10-40 mL, 10-40 mL, Intracatheter, Q12H, Shawna Clamp, MD, 10 mL at 08/12/20 2138 .  sodium chloride flush (NS) 0.9 % injection 10-40 mL, 10-40 mL, Intracatheter, PRN, Shawna Clamp, MD Labs CBC    Component Value Date/Time   WBC 17.8 (H) 08/13/2020 0415   RBC 4.04 (L) 08/13/2020 0415   HGB 10.4 (L) 08/13/2020 0415   HCT 32.1 (L) 08/13/2020 0415   PLT 120 (L) 08/13/2020 0415   MCV 79.5 (L) 08/13/2020 0415   MCH 25.7 (L) 08/13/2020 0415   MCHC 32.4 08/13/2020 0415   RDW 17.7 (H) 08/13/2020 0415   LYMPHSABS 0.8 08/08/2020 1247   MONOABS 0.4 08/08/2020 1247   EOSABS 0.0 08/08/2020 1247   BASOSABS 0.0 08/08/2020 1247    CMP     Component Value Date/Time   NA 148 (H) 08/13/2020 0415   K 2.7 (LL) 08/13/2020 0415   CL 117 (H) 08/13/2020 0415   CO2 17 (L) 08/13/2020 0415   GLUCOSE 93 08/13/2020 0415   BUN 18 08/13/2020 0415   CREATININE 0.53 (L) 08/13/2020 0415   CREATININE 0.60 12/04/2017 1258   CALCIUM 8.5 (L) 08/13/2020 0415   PROT 4.3 (L) 08/11/2020 2232   ALBUMIN 1.4 (L) 08/11/2020 2232   AST 40 08/11/2020 2232   ALT 112 (H) 08/11/2020 2232   ALKPHOS 93 08/11/2020 2232   BILITOT 0.5 08/11/2020 2232   GFRNONAA >60 08/13/2020 0415   GFRAA >60 08/13/2020 0415    Spot EEG negative for seizures LTM read pending  Imaging I have reviewed images in epic and the results pertinent to this consultation are: Repeat CT head yesterday that showed rather earlier this morning was motion degraded exam with a question of increasing hypodensity involving the posterior limb of the right internal capsule-while this may be artifactual given the motion artifact it is difficult to exclude a stroke. 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 Had an acute change in mentation with worsening mental status, was aphasic and further lower than his baseline with concern for ongoing seizure activity/status epilepticus. Spot EEG negative for seizures. Repeat CT head with concern for a right posterior limb of internal capsule increasing hypodensity. Also has a chest tube in place for pleural effusion. I suspect that this current presentation is secondary to toxic metabolic encephalopathy from his aspiration pneumonia and pleural effusions etc. It is hard to rule out a stroke unless it is a big size on CT scan. Whenever he is stable, repeat MRI can be done-1 was done on admission at [facility] prior  to transfer to [facility] that did not show an acute stroke but showed extensive pneumatosis changes as described previously.   Impression: -Toxic metabolic encephalopathy versus seizures versus status epilepticus -Question new stroke on CT head-difficult to clinically correlate due to pre-existing quadriplegia and current intubation.  Recommendations: Continue supportive care per primary team as you are Continue sepsis management and aspiration pneumonia management per primary team as you are Management of hypernatremia per primary team. Pending LTM EEG results, will decide if it needs to be continued. He is currently on Keppra-I would recommend continuing it for now. He is also on cefepime-can have neurotoxicity-we will recommend using an alternative antibiotic MRI of the brain when able to.  Await EEG reports, if we are able to remove the EEG leads, MRI can be done then. Discussed my plan with Dr. Tamala Julian.    -- Amie Portland, MD [facility] Pager:  [phone] If 7pm to 7am, please call on call as listed on AMION.  CRITICAL CARE ATTESTATION Performed by: Amie Portland, MD Total critical care time:72mnutes Critical care time was exclusive of separately billable procedures and treating other patients and/or supervising APPs/Residents/Students Critical care was necessary to treat or prevent imminent or life-threatening deterioration due to toxic metabolic encephalopathy, evaluation for seizure versus stroke. This patient is critically ill and at significant risk for neurological worsening and/or death and care requires constant monitoring. Critical care was time spent personally by me on the following activities: development of treatment plan with patient and/or surrogate as well as nursing, discussions with consultants, evaluation of patient's response to treatment, examination of patient, obtaining history from patient or surrogate, ordering and performing treatments and interventions, ordering and review of laboratory studies, ordering and review of radiographic studies, pulse oximetry, re-evaluation of patient's condition, participation in multidisciplinary rounds and medical decision making of high complexity in the care of this patient.   Addendum MRI brain without contrast completed-initial intent was to evaluate for hypoxic/anoxic brain injury versus worsening of the known neurofibromas but findings are more consistent with stroke as below. Seen on today's MRI are bilateral frontal infarcts predominantly and the ACA territory bilaterally.  Small superior vermian acute infarct.  Chronic sequela of NF 2 grossly unchanged.  No new or enlarging intracranial lesions however lack of contrast limits that evaluation. The dominant parafalcine meningioma and bifrontal edema grossly unchanged in size. Given these new findings, it would be prudent to pursue a full stroke work-up. Given an absolutely increase in WBCs from baseline, cardiac infectious  source should also be considered reason for strokes.  Recommendations: 2D echo-might need TEE if remains full scope of care. A1c Lipid panel Frequent neurochecks Continue cardiac monitoring to evaluate for any rhythm abnormalities Aspirin CT head and neck  I have placed the stroke order set in the chart.  I would also recommend having goals of care conversations if not already initiated because of the fact that he has significant damage from his NF 2 in his brain and C-spine and now with these new strokes, his cognitive function would be further worsened as the strokes involve primarily his bilateral frontal lobes.  Stroke team will follow with you.  -- AAmie Portland MD [facility]

## 2020-08-13 NOTE — Procedures (Addendum)
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 EEG to evaluate for seizure.  Level of alertness:  lethargic  AEDs during EEG study: LEV  Technical aspects: This EEG study was done with scalp electrodes positioned according to the 10-20 International system of electrode placement. Electrical activity was acquired at a sampling rate of 500Hz  and reviewed with a high frequency filter of 70Hz  and a low frequency filter of 1Hz . EEG data were recorded continuously and digitally stored.   Description: Noposterior dominant rhythm was seen.EEG showed continuous generalized 3 to 6 Hz theta-delta slowing admixed with intermittent 8-9hz  alpha activity predominantly in posterior head region. Hyperventilation and photic stimulation were not performed.   Event button was pressed on 08/13/2020 at 1246 for unclear reasons. Concomitant eeg before, during and after the event didn't show eeg change to suggest seizure.  Of notes, parts of study were difficult to interpret due to significant electrode artifact.   ABNORMALITY -Continuousslow, generalized  IMPRESSION: This technically difficult study is suggestive of moderate diffuse encephalopathy, nonspecific etiology.No seizures or epileptiform discharges were seen throughout the recording.  Event button was pressed on 08/13/2020 as described above without concomitant eeg change was likely not epileptic.  Keyan Folson [name]

## 2020-08-14 ENCOUNTER — Inpatient Hospital Stay (HOSPITAL_COMMUNITY): Payer: Medicare Other

## 2020-08-14 DIAGNOSIS — R4020 Unspecified coma: Secondary | ICD-10-CM

## 2020-08-14 DIAGNOSIS — I6330 Cerebral infarction due to thrombosis of unspecified cerebral artery: Secondary | ICD-10-CM | POA: Insufficient documentation

## 2020-08-14 DIAGNOSIS — I361 Nonrheumatic tricuspid (valve) insufficiency: Secondary | ICD-10-CM

## 2020-08-14 DIAGNOSIS — I6389 Other cerebral infarction: Secondary | ICD-10-CM

## 2020-08-14 LAB — BASIC METABOLIC PANEL
Anion gap: 12 (ref 5–15)
BUN: 21 mg/dL — ABNORMAL HIGH (ref 6–20)
CO2: 15 mmol/L — ABNORMAL LOW (ref 22–32)
Calcium: 8.4 mg/dL — ABNORMAL LOW (ref 8.9–10.3)
Chloride: 120 mmol/L — ABNORMAL HIGH (ref 98–111)
Creatinine, Ser: 0.67 mg/dL (ref 0.61–1.24)
GFR calc Af Amer: >60 mL/min (ref >60)
GFR calc non Af Amer: >60 mL/min (ref >60)
Glucose, Bld: 109 mg/dL — ABNORMAL HIGH (ref 70–99)
Potassium: 4.1 mmol/L (ref 3.5–5.1)
Sodium: 147 mmol/L — ABNORMAL HIGH (ref 135–145)

## 2020-08-14 LAB — CBC
HCT: 28.9 % — ABNORMAL LOW (ref 39.0–52.0)
Hemoglobin: 9.6 g/dL — ABNORMAL LOW (ref 13.0–17.0)
MCH: 26 pg (ref 26.0–34.0)
MCHC: 33.2 g/dL (ref 30.0–36.0)
MCV: 78.3 fL — ABNORMAL LOW (ref 80.0–100.0)
Platelets: 102 10*3/uL — ABNORMAL LOW (ref 150–400)
RBC: 3.69 MIL/uL — ABNORMAL LOW (ref 4.22–5.81)
RDW: 17.7 % — ABNORMAL HIGH (ref 11.5–15.5)
WBC: 16.9 10*3/uL — ABNORMAL HIGH (ref 4.0–10.5)
nRBC: 0.2 % (ref 0.0–0.2)

## 2020-08-14 LAB — GLUCOSE, CAPILLARY
Glucose-Capillary: 100 mg/dL — ABNORMAL HIGH (ref 70–99)
Glucose-Capillary: 116 mg/dL — ABNORMAL HIGH (ref 70–99)
Glucose-Capillary: 117 mg/dL — ABNORMAL HIGH (ref 70–99)
Glucose-Capillary: 126 mg/dL — ABNORMAL HIGH (ref 70–99)
Glucose-Capillary: 157 mg/dL — ABNORMAL HIGH (ref 70–99)
Glucose-Capillary: 92 mg/dL (ref 70–99)

## 2020-08-14 LAB — LIPID PANEL
Cholesterol: 139 mg/dL (ref 0–200)
HDL: 45 mg/dL (ref >40)
LDL Cholesterol: 78 mg/dL (ref 0–99)
Total CHOL/HDL Ratio: 3.1 RATIO
Triglycerides: 78 mg/dL (ref <150)
VLDL: 16 mg/dL (ref 0–40)

## 2020-08-14 LAB — ECHOCARDIOGRAM COMPLETE
Area-P 1/2: 4.21 cm2
Height: 70 in
Weight: 2553.81 oz

## 2020-08-14 LAB — HEMOGLOBIN A1C
Hgb A1c MFr Bld: 6.1 % — ABNORMAL HIGH (ref 4.8–5.6)
Mean Plasma Glucose: 128.37 mg/dL

## 2020-08-14 LAB — PATHOLOGIST SMEAR REVIEW

## 2020-08-14 LAB — MAGNESIUM: Magnesium: 2 mg/dL (ref 1.7–2.4)

## 2020-08-14 LAB — PHOSPHORUS: Phosphorus: 2.7 mg/dL (ref 2.5–4.6)

## 2020-08-14 MED ORDER — VITAL HIGH PROTEIN PO LIQD
1000.0000 mL | ORAL | Status: DC
  Administered 2020-08-14: 1000 mL

## 2020-08-14 MED ORDER — SODIUM CHLORIDE 0.9 % IV SOLN
2.0000 g | INTRAVENOUS | Status: AC
  Administered 2020-08-14 – 2020-08-16 (×3): 2 g via INTRAVENOUS
  Filled 2020-08-14: qty 2
  Filled 2020-08-14: qty 20
  Filled 2020-08-14: qty 2

## 2020-08-14 MED ORDER — PERFLUTREN LIPID MICROSPHERE
1.0000 mL | INTRAVENOUS | Status: AC | PRN
  Filled 2020-08-14: qty 10

## 2020-08-14 MED ORDER — PERFLUTREN LIPID MICROSPHERE
INTRAVENOUS | Status: AC
  Administered 2020-08-14: 2 mL via INTRAVENOUS
  Filled 2020-08-14: qty 10

## 2020-08-14 MED ORDER — VITAL HIGH PROTEIN PO LIQD: 1000.0000 mL | ORAL | Status: DC

## 2020-08-14 MED ORDER — IOHEXOL 350 MG/ML SOLN
75.0000 mL | Freq: Once | INTRAVENOUS | Status: AC | PRN
  Administered 2020-08-14: 75 mL via INTRAVENOUS

## 2020-08-14 MED ORDER — ASPIRIN 81 MG PO CHEW
81.0000 mg | CHEWABLE_TABLET | Freq: Every day | ORAL | Status: DC
  Administered 2020-08-14 – 2020-08-21 (×8): 81 mg
  Filled 2020-08-14 (×8): qty 1

## 2020-08-14 MED ORDER — VITAL AF 1.2 CAL PO LIQD
1000.0000 mL | ORAL | Status: DC
  Administered 2020-08-14 – 2020-08-21 (×10): 1000 mL
  Filled 2020-08-14 (×3): qty 1000

## 2020-08-14 MED ORDER — METOLAZONE 5 MG PO TABS
5.0000 mg | ORAL_TABLET | Freq: Once | ORAL | Status: AC
  Administered 2020-08-14: 5 mg via ORAL
  Filled 2020-08-14: qty 1

## 2020-08-14 NOTE — Progress Notes (Signed)
eLink Physician-Brief Progress Note Patient Name: Cameron Hale DOB: [birth year] [name]: [id number]   Date of Service  08/14/2020  HPI/Events of Note  Notified of bradycardia, patient hypothermic as well  eICU Interventions  Bradycardia likely due to hypothermia. Also titrate down propofol as tolerated     Intervention Category Major Interventions: Arrhythmia - evaluation and management  Judd Lien 08/14/2020, 3:38 AM

## 2020-08-14 NOTE — Progress Notes (Signed)
Initial Nutrition Assessment  DOCUMENTATION CODES:   Not applicable  INTERVENTION:   Tube feeds via OG tube: - Vital AF 1.2 @ 65 ml/hr (1560 ml/day) - Free water flushes per CCM, currently 200 ml q 6 hours  Tube feeding regimen with current free water flushes provides 1872 kcal, 117 grams of protein, and 2065 ml of H2O.   - MVI with minerals daily per tube  NUTRITION DIAGNOSIS:   Increased nutrient needs related to wound healing as evidenced by estimated needs.  GOAL:   Provide needs based on ASPEN/SCCM guidelines  MONITOR:   Vent status, Labs, Weight trends, TF tolerance, Skin, I & O's  REASON FOR ASSESSMENT:   Ventilator, Consult Enteral/tube feeding initiation and management, Assessment of nutrition requirement/status  ASSESSMENT:   44 year old male who presented to the ED on 9/21 with AMS. 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 Pt admitted with sepsis, left upper lobe aspiration pneumonia.  9/25 - intubated, chest tube insertion  Discussed pt with RN and during ICU rounds. Palliative has been consulted for Catharine discussions. Family meeting planned for tomorrow.  Received consult for TF initiation and management. OG tube in place per abdominal x-ray with TF currently infusing. RD will adjust TF regimen to better meet pt's needs.  Current TF orders: Vital High Protein @ 40 ml/hr, free water 200 ml q 6 hours  Reviewed weight history in chart. Pt weight weight gain over the last 2 months from 65.6 kg on 05/30/20 to 72.4 kg at present. Pt with non-pitting edema to BUE and BLE which may be falsely elevating weight.  Patient is currently intubated on ventilator support MV: 10.8 L/min Temp (24hrs), Avg:95.1 F (35.1 C), Min:91.4 F (33 C), Max:98.8 F (37.1 C) BP (a-line): 111/64 MAP (a-line): 82  Drips: Propofol: none D10: 20 ml/hr  Medications reviewed and  include: solu-medrol, protonix, IV Keppra  Labs reviewed: sodium 147, hemoglobin 9.6 CBG's: 89-116 x 24 hours  UOP: 600 ml x 24 hours NGT output: 150 ml x 24 hours CT output: 150 ml x 24 hours I/O's: +5.0 L since admit  NUTRITION - FOCUSED PHYSICAL EXAM:  Deferred. Family in room attempting to interact with pt at time of visit. Will complete at follow-up.  Diet Order:   Diet Order            Diet NPO time specified  Diet effective now                 EDUCATION NEEDS:   No education needs have been identified at this time  Skin:  Skin Assessment: Skin Integrity Issues: Unstageable: sacrum (per WOC note)  Last BM:  08/14/20  Height:   Ht Readings from Last 1 Encounters:  08/12/20 5\' 10"  (1.778 m)    Weight:   Wt Readings from Last 1 Encounters:  08/14/20 72.4 kg    Ideal Body Weight:  68 kg (adjusted for quadriplegia)  BMI:  Body mass index is 22.9 kg/m.  Estimated Nutritional Needs:   Kcal:  1872  Protein:  110-130 grams  Fluid:  >/= 1.8 L    Gaynell Face, MS, RD, LDN Inpatient Clinical Dietitian Please see AMiON for contact information.

## 2020-08-14 NOTE — Progress Notes (Signed)
STROKE TEAM PROGRESS NOTE   INTERVAL HISTORY No family at bedside. Intubated for respiratory failure and inability to protect his airway..  I have personally reviewed history of presenting illness, electronic medical records and imaging films in PACS.  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  Is presented with altered mental status.  MRI scan shows diffusion hyperintensity in medial parasagittal frontal lobes consistent with ACA infarct suspect due to increasing involvement with the enlarging meningiomas.  Long-term EEG monitoring shows moderate diffuse encephalopathy without definite seizures.  2D echo is pending. Vitals:   08/14/20 0700 08/14/20 0800 08/14/20 0804 08/14/20 0900  BP:  (!) 175/144    Pulse: 66 81 74 78  Resp: 16 20 16 16   Temp: (!) 97 F (36.1 C) 98.2 F (36.8 C)  98.8 F (37.1 C)  TempSrc:  Bladder  Bladder  SpO2: 100% 100% 100% 99%  Weight:      Height:       CBC:  Recent Labs  Lab 08/08/20 1247 08/09/20 0449 08/13/20 0415 08/13/20 0415 08/13/20 0501 08/14/20 0455  WBC 10.3   < > 17.8*  --   --  16.9*  NEUTROABS 8.8*  --   --   --   --   --   HGB 9.5*   < > 10.4*   < > 10.5* 9.6*  HCT 30.3*   < > 32.1*   < > 31.0* 28.9*  MCV 85.6   < > 79.5*  --   --  78.3*  PLT 113*   < > 120*  --   --  102*   < > = values in this interval not displayed.   Basic Metabolic Panel:  Recent Labs  Lab 08/13/20 0415 08/13/20 0501 08/13/20 1129 08/14/20 0455  NA 148*   < > 148* 147*  K 2.7*   < > 3.6 4.1  CL 117*   < > 120* 120*  CO2 17*   < > 15* 15*  GLUCOSE 93   < > 69* 109*  BUN 18   < > 18 21*  CREATININE 0.53*   < > 0.62 0.67  CALCIUM 8.5*   < > 8.2* 8.4*  MG 2.5*  --   --  2.0  PHOS 3.2  --   --  2.7   < > = values in this interval not displayed.   Lipid Panel:  Recent Labs  Lab 08/14/20 0455  CHOL 139  TRIG 78  HDL 45  CHOLHDL 3.1  VLDL 16  LDLCALC 78   HgbA1c:  Recent Labs  Lab  08/14/20 0455  HGBA1C 6.1*   Urine Drug Screen:  Recent Labs  Lab 08/08/20 1526  LABOPIA NONE DETECTED  COCAINSCRNUR NONE DETECTED  LABBENZ NONE DETECTED  AMPHETMU NONE DETECTED  THCU NONE DETECTED  LABBARB NONE DETECTED    Alcohol Level No results for input(s): ETH in the last 168 hours.  IMAGING past 24 hours CT ANGIO HEAD W OR WO CONTRAST  Result Date: 08/14/2020 CLINICAL DATA:  Follow-up examination for stroke. EXAM: CT ANGIOGRAPHY HEAD AND NECK TECHNIQUE: Multidetector CT imaging of the head and neck was performed using the standard protocol during bolus administration of intravenous contrast. Multiplanar CT image reconstructions and MIPs were obtained to evaluate the vascular anatomy. Carotid stenosis measurements (when applicable) are obtained utilizing NASCET criteria, using the distal internal carotid diameter as the denominator. CONTRAST:  78mL OMNIPAQUE IOHEXOL 350 MG/ML SOLN COMPARISON:  Prior  MRI from 08/13/2020. FINDINGS: CT HEAD FINDINGS Brain: Extensive changes of NF 2 again noted, fully described on prior studies. Recently identified ischemic infarcts better seen on recent brain MRI, grossly stable. No significant mass effect, hemorrhagic transformation, or other complication. No other definite new acute intracranial abnormality. No intracranial hemorrhage. No midline shift or hydrocephalus. No extra-axial fluid collection. Vascular: No hyperdense vessel. Skull: Stable.  No new finding identified. Sinuses: Partial opacification of the right frontal sinus, with scattered mucosal thickening within the ethmoidal air cells and maxillary sinuses. Left frontal osteoma again noted. Moderate bilateral mastoid effusions. Orbits: Unchanged.  No new or acute finding. Review of the MIP images confirms the above findings CTA NECK FINDINGS Aortic arch: Visualized aortic arch normal in caliber with normal branch pattern. No hemodynamically significant stenosis seen about the origin of the great  vessels. Right carotid system: Right common and internal carotid arteries widely patent without stenosis, dissection or occlusion. Left carotid system: Left common and internal carotid arteries widely patent without stenosis, dissection or occlusion. Vertebral arteries: Both vertebral arteries arise from the subclavian arteries. Vertebral arteries widely patent within the neck without stenosis, dissection or occlusion. Skeleton: No visible acute osseous abnormality. Posterior Harrington fixation rods partially visualized at the upper thoracic spine. Osseous structures are diffusely heterogeneous in appearance without definite discrete lytic or blastic osseous lesions. Other neck: Endotracheal and enteric tubes in place. No other acute soft tissue abnormality within the neck. Upper chest: Multifocal parenchymal opacity noted within the visualized lungs, most pronounced at the posterior right upper lobe, concerning for multifocal infection. Large heterogeneous layering left pleural effusion partially visualized. Right central venous catheter in place. Review of the MIP images confirms the above findings CTA HEAD FINDINGS Anterior circulation: Petrous segments patent bilaterally. Mild smooth narrowing at the para clinoid right ICA. Both internal carotid arteries otherwise widely patent to the termini. A1 segments patent bilaterally. Normal anterior communicating artery complex. Anterior cerebral arteries are markedly diminutive but patent to their distal aspects without stenosis or occlusion. Similarly, M1 segments are diminutive but patent as well. Negative MCA bifurcations. Distal MCA branches well perfused bilaterally. Posterior circulation: Diminutive vertebral arteries patent to the vertebrobasilar junction without stenosis. Left vertebral artery slightly dominant. Patent right PICA. Left PICA not seen. Markedly diminutive basilar artery remains patent to its distal aspect. Superior cerebral arteries patent  bilaterally. Fetal type origin of the PCAs supplied via bilateral posterior communicating arteries. Both PCAs are well perfused to their distal aspects. Venous sinuses: Not well evaluated due to timing the contrast bolus. Suspected superior sagittal sinus occlusion at the level of the parafalcine meningioma. Anatomic variants: None significant. No appreciable intracranial aneurysm. Review of the MIP images confirms the above findings IMPRESSION: CT HEAD IMPRESSION: 1. No significant interval change of multifocal evolving ischemic infarcts, better evaluated on recent brain MRI. No hemorrhagic transformation or other complication. 2. Extensive underlying changes related to NF2, stable. No new intracranial abnormality. CTA HEAD AND NECK IMPRESSION: 1. Negative CTA for large vessel occlusion. No hemodynamically significant or correctable stenosis. The intracranial circulation is diffusely diminutive but remains patent. 2. Multifocal opacities throughout the visualized lungs, concerning for multifocal infection/pneumonia. 3. Large layering left pleural effusion, partially visualized. Electronically Signed   By: Jeannine Boga M.D.   On: 08/14/2020 04:27   DG Abd 1 View  Result Date: 08/14/2020 CLINICAL DATA:  OG tube placement EXAM: ABDOMEN - 1 VIEW COMPARISON:  08/12/2020 FINDINGS: Enteric tube is present with tip in the left upper quadrant consistent  with location in the upper stomach. Pigtail catheter on the right, likely in the posterior right pleural space. Postoperative fixation of the thoracolumbar spine. Scattered gas within the colon. No small or large bowel distention. IMPRESSION: Enteric tube tip is in the left upper quadrant consistent with location in the upper stomach. Electronically Signed   By: Lucienne Capers M.D.   On: 08/14/2020 02:00   CT ANGIO NECK W OR WO CONTRAST  Result Date: 08/14/2020 CLINICAL DATA:  Follow-up examination for stroke. EXAM: CT ANGIOGRAPHY HEAD AND NECK TECHNIQUE:  Multidetector CT imaging of the head and neck was performed using the standard protocol during bolus administration of intravenous contrast. Multiplanar CT image reconstructions and MIPs were obtained to evaluate the vascular anatomy. Carotid stenosis measurements (when applicable) are obtained utilizing NASCET criteria, using the distal internal carotid diameter as the denominator. CONTRAST:  51mL OMNIPAQUE IOHEXOL 350 MG/ML SOLN COMPARISON:  Prior MRI from 08/13/2020. FINDINGS: CT HEAD FINDINGS Brain: Extensive changes of NF 2 again noted, fully described on prior studies. Recently identified ischemic infarcts better seen on recent brain MRI, grossly stable. No significant mass effect, hemorrhagic transformation, or other complication. No other definite new acute intracranial abnormality. No intracranial hemorrhage. No midline shift or hydrocephalus. No extra-axial fluid collection. Vascular: No hyperdense vessel. Skull: Stable.  No new finding identified. Sinuses: Partial opacification of the right frontal sinus, with scattered mucosal thickening within the ethmoidal air cells and maxillary sinuses. Left frontal osteoma again noted. Moderate bilateral mastoid effusions. Orbits: Unchanged.  No new or acute finding. Review of the MIP images confirms the above findings CTA NECK FINDINGS Aortic arch: Visualized aortic arch normal in caliber with normal branch pattern. No hemodynamically significant stenosis seen about the origin of the great vessels. Right carotid system: Right common and internal carotid arteries widely patent without stenosis, dissection or occlusion. Left carotid system: Left common and internal carotid arteries widely patent without stenosis, dissection or occlusion. Vertebral arteries: Both vertebral arteries arise from the subclavian arteries. Vertebral arteries widely patent within the neck without stenosis, dissection or occlusion. Skeleton: No visible acute osseous abnormality. Posterior  Harrington fixation rods partially visualized at the upper thoracic spine. Osseous structures are diffusely heterogeneous in appearance without definite discrete lytic or blastic osseous lesions. Other neck: Endotracheal and enteric tubes in place. No other acute soft tissue abnormality within the neck. Upper chest: Multifocal parenchymal opacity noted within the visualized lungs, most pronounced at the posterior right upper lobe, concerning for multifocal infection. Large heterogeneous layering left pleural effusion partially visualized. Right central venous catheter in place. Review of the MIP images confirms the above findings CTA HEAD FINDINGS Anterior circulation: Petrous segments patent bilaterally. Mild smooth narrowing at the para clinoid right ICA. Both internal carotid arteries otherwise widely patent to the termini. A1 segments patent bilaterally. Normal anterior communicating artery complex. Anterior cerebral arteries are markedly diminutive but patent to their distal aspects without stenosis or occlusion. Similarly, M1 segments are diminutive but patent as well. Negative MCA bifurcations. Distal MCA branches well perfused bilaterally. Posterior circulation: Diminutive vertebral arteries patent to the vertebrobasilar junction without stenosis. Left vertebral artery slightly dominant. Patent right PICA. Left PICA not seen. Markedly diminutive basilar artery remains patent to its distal aspect. Superior cerebral arteries patent bilaterally. Fetal type origin of the PCAs supplied via bilateral posterior communicating arteries. Both PCAs are well perfused to their distal aspects. Venous sinuses: Not well evaluated due to timing the contrast bolus. Suspected superior sagittal sinus occlusion at the level  of the parafalcine meningioma. Anatomic variants: None significant. No appreciable intracranial aneurysm. Review of the MIP images confirms the above findings IMPRESSION: CT HEAD IMPRESSION: 1. No significant  interval change of multifocal evolving ischemic infarcts, better evaluated on recent brain MRI. No hemorrhagic transformation or other complication. 2. Extensive underlying changes related to NF2, stable. No new intracranial abnormality. CTA HEAD AND NECK IMPRESSION: 1. Negative CTA for large vessel occlusion. No hemodynamically significant or correctable stenosis. The intracranial circulation is diffusely diminutive but remains patent. 2. Multifocal opacities throughout the visualized lungs, concerning for multifocal infection/pneumonia. 3. Large layering left pleural effusion, partially visualized. Electronically Signed   By: Jeannine Boga M.D.   On: 08/14/2020 04:27   MR BRAIN WO CONTRAST  Result Date: 08/13/2020 CLINICAL DATA:  Anoxic brain injury. EXAM: MRI HEAD WITHOUT CONTRAST TECHNIQUE: Multiplanar, multiecho pulse sequences of the brain and surrounding structures were obtained without intravenous contrast. COMPARISON:  08/13/2020 head CT and prior. 05/24/2020 MRI head and prior. FINDINGS: Lack of intravenous contrast limits evaluation. Brain: New cortically based restricted diffusion involving the bilateral frontal lobes most prominent medially. Small focal restricted diffusion along the superior vermis (5:73). Left frontal white matter microhemorrhages are unchanged. Redemonstration of extensive meningiomatosis with with additional meningiomas overlying the bilateral cerebral convexities and infratentorially. Dominant parafalcine meningioma with intralesional SWI signal dropout is grossly unchanged in size. Bifrontal perilesional edema is also unchanged. No midline shift, ventriculomegaly or extra-axial fluid collection. No definite new or enlarging mass lesions. Vascular: Major intracranial flow voids are patent proximally. Skull and upper cervical spine: Heterogeneity of the bone marrow signal most prominent at the vertex is unchanged. Sinuses/Orbits: Normal orbits. Sequela of chronic right  sphenoid and left frontal allergic fungal sinusitis. Bilateral mastoid effusions. Other: None. IMPRESSION: Cortically based acute infarcts involving the bilateral frontal lobes most prominent in the ACA territory. Small superior vermian acute infarct. Chronic sequela of NF 2, grossly unchanged. No new or enlarging intracranial lesions however lack of intravenous contrast limits evaluation. Dominant parafalcine meningioma and bifrontal edema, grossly unchanged in size. These results were called by telephone at the time of interpretation on 08/13/2020 at 4:56 pm to provider [facility] , who verbally acknowledged these results. Electronically Signed   By: Primitivo Gauze M.D.   On: 08/13/2020 16:58   DG Chest Port 1 View  Result Date: 08/14/2020 CLINICAL DATA:  Intubation.  Chest tube. EXAM: PORTABLE CHEST 1 VIEW COMPARISON:  Chest x-ray 08/13/2020. FINDINGS: Endotracheal tube, NG tube, right PICC line, right chest tube in stable position. No pneumothorax. Heart size stable. Patchy bilateral pulmonary infiltrates are again noted without significant change. No pleural effusion. Prior thoracic spine fusion. IMPRESSION: 1. Lines and tubes including right chest tube in stable position. No pneumothorax. 2. Patchy bilateral pulmonary infiltrates again noted without significant change. Electronically Signed   By: Marcello Moores  Register   On: 08/14/2020 06:18    PHYSICAL EXAM Obese middle-aged Caucasian male who is sedated and intubated.  Not in distress.  He is having continuous involuntary twitching's of his jaw and lip. . Afebrile. Head is nontraumatic. Neck is supple without bruit.    Cardiac exam no murmur or gallop. Lungs are clear to auscultation. Distal pulses are well felt. Neurological Exam :  Patient is comatose, sedated intubated.  Eyes are partially open to sternal rub.  Eyes in primary position.  Pupils equal reactive.  Does not blink to threat bilaterally.  Fundi not visualized.  Tongue is midline with  involuntary twitching's noted involving jaw  lips and tongue.  Motor system exam shows no spontaneous movements.  Withdraws barely to sternal rub with partial flexion in the lower extremities and slight in the right upper but none in the left upper extremities.  Deep tendon reflexes are depressed.  Plantars of both mute.   ASSESSMENT/PLAN [name] is a 44 y.o. male with history of neurofibromatosis type II with extensive cervical neuromas-underwent surgery March of this year and unfortunately complicated by postoperative infection and quadriplegia, seizures on keppra, blind OS w/ impaired eye movements, presenting to [facility] 9/22 with altered mental status following a staring spell. Developed hypoglycemia, hypotension and increased WOB. admitted to ICU.    Toxic metabolic encephalopathy versus seizures versus status epilepticus  Bicerebral medial frontal infarcts likely secondary to involvement of anterior cerebral arteries due to compression from  long-standing NF2 changes and meningiomatosis   unresponsive     CT head ? R PLIC hypodensity. No new abnormality. Extensive NF2 changes   MRI  B frontal ACA infarcts. Small superior vermian infarct. Chronic NF2 changes. parafalcine meningioma w/ bifrontal edema.   CT head no new ischemia . Extensive NF2   CTA head & neck no LVO or significant stenosis. Multifocal lung opacities concerning for PNA. Large L pleural effusion  2D Echo w/ bubble pending   LDL 78  HgbA1c 6.1  VTE prophylaxis - Lovenox 40 mg sq daily   No antithrombotic prior to admission, now on No antithrombotic. Add low dose aspirin via tube  Therapy recommendations:  pending   Disposition:  pending   Acute Hypoxemic Respiratory Failure  Intubated, sedated  CCM on board  Atrial Fibrillation  Noted at time of intubation 9/25 by Dr. Tamala Julian  Not an [facility] candidate given vascular cerebral lesion   RN clarified on tele no hx AF only bradycardia w/ AV  block   Labile BP  Variable  . From stroke standpoint, ok for Permissive hypertension (OK if < 220/120) but gradually normalize in 5-7 days . Long-term BP goal normotensive  Hyperlipidemia  Home meds:  No statin  LDL 78, goal < 70  Add statin (lipitor 40) at discharge based on plan of care   Hypoglycemia  HgbA1c 6.1  Dysphagia . NPO   Other Stroke Risk Factors  Hx ETOH use  Other Active Problems  NF2  Acute metabolic encephalopathy   Hyponatremia, anasarca  R pleural effusions  Seizures on keppra PTA. EEG neg [facility] day # 6 Patient has bilateral medial frontal infarcts likely due to involvement of anterior cerebral arteries due to his extensive meningiomatosis from his neurofibromatosis.  Atrial fibrillation was transiently documented during intubation and could also have contributed but patient is not a good long-term anticoagulation candidate given extensive vascular brain tumors.  Continue aspirin.  Check echo.  Continue Keppra for seizures.  Patient's prognosis is quite poor given baseline quadriplegia and now comatose state.  Family to meet with primary team and palliative care to discuss goals of care.  Discussed with Dr. Tamala Julian and answered questions. This patient is critically ill and at significant risk of neurological worsening, death and care requires constant monitoring of vital signs, hemodynamics,respiratory and cardiac monitoring, extensive review of multiple databases, frequent neurological assessment, discussion with family, other specialists and medical decision making of high complexity.I have made any additions or clarifications directly to the above note.This critical care time does not reflect procedure time, or teaching time or supervisory time of PA/NP/Med Resident etc but could involve care discussion  time.  I spent 30 minutes of neurocritical care time  in the care of  this patient.    Antony Contras, MD  To contact Stroke Continuity  provider, please refer to [email or web address] After hours, contact General Neurology

## 2020-08-14 NOTE — Progress Notes (Signed)
Patient transported to CT and back to room 2H 11 without any complications. RN bedside.

## 2020-08-14 NOTE — Progress Notes (Signed)
NAME:  ADANTE COURINGTON, [name]:  [id number], DOB:  1975-12-22, LOS: 6 ADMISSION DATE:  08/08/2020, CONSULTATION DATE:  08/12/20 REFERRING MD:  Opyd, CHIEF COMPLAINT:  unresponsive   Brief History   44 year old man with quadriplegia, seizure disorder presenting with suspected recurrent seizures, hypoxemia, and hypotension.  History of present illness   44 year old man with hx of type 2 neurofibromatosis, quadriplegia, blindness, hearing loss (baseline only able to talk) presenting with recurrent unresponsive episodes as well as generalized seizure activity.  Brought to [facility] for further management.  Unfortunately less responsive this AM with hypotension and hypoxemia so PCCM consulted.  Patient unable to provide any history.  Normally followed by [facility] but due to instability was brought into [place].  Past Medical History  -Neurofibromatosis 2 -Neurosurgical intervention March 2021 with postoperative course complicated by functional quadriplegia, blindness, hearing loss (baseline only able to talk)  Decubitus pressue ulcer  Significant Hospital Events   9/21 admitted to [facility] 9/25 ETT, PICC, arterial line, right chest tube  Consults:  Neurology  Procedures:   9/24 R PICC >> 9/25 ETT >> 9/25 L radial aline >> 9/25 R pigtail CT >>  9/21 EEG no seizures 9/26 cEEG d/c- > no seizures  Significant Diagnostic Tests:  CT Brain 9/21 IMPRESSION: No evidence of acute intracranial hemorrhage or acute demarcated cortical infarct. Extensive meningiomatosis more fully characterized on the recent prior brain MRI of 05/24/2020. Most notably, this includes a large parafalcine meningioma as well as extensive en plaque meningiomas greatest along the anterior and superior cerebral convexities. Unchanged prominent vasogenic edema within the underlying mid-to-anterior frontal lobes and crossing the corpus callosum. As before, there is herniation of the rectus gyri into the  sella turcica. 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 A known right vestibular schwannoma was better appreciated on the prior MRI. Questionable asymmetric enlargement of the right hippocampus was also better appreciated on the prior MRI.  9/26 CTH >> 1. Motion degraded exam. 2. Question an approximate 1.3 cm area of increased hypodensity involving the posterior limb of the right internal capsule. While this finding may be artifactual in nature given motion artifact on this exam, a possible evolving ischemic infarct is difficult to exclude, and could be considered in the correct clinical setting. 3. No other new acute intracranial abnormality. 4. Underlying extensive changes related to NF 2 with meningiomatosis and associated bifrontal edema, stable.  9/26 MR brain >> Cortically based acute infarcts involving the bilateral frontal lobes most prominent in the ACA territory.  Small superior vermian acute infarct.  Chronic sequela of NF 2, grossly unchanged. No new or enlarging intracranial lesions however lack of intravenous contrast limits evaluation.  Dominant parafalcine meningioma and bifrontal edema, grossly unchanged in size.  9/27 CTH 1. No significant interval change of multifocal evolving ischemic infarcts, better evaluated on recent brain MRI. No hemorrhagic transformation or other complication. 2. Extensive underlying changes related to NF2, stable. No new intracranial abnormality.  9/27 CTA head/ neck >> 1. Negative CTA for large vessel occlusion. No hemodynamically significant or correctable stenosis. The intracranial circulation is diffusely diminutive but remains patent. 2. Multifocal opacities throughout the visualized lungs, concerning for multifocal infection/pneumonia. 3. Large layering left pleural effusion, partially  visualized.  Micro Data:  9/21 COVID neg 9/21 UC neg 9/21 BCx 2 >> neg 9/24 MRSA neg 9/25 R pleural fluid cx >>  9/25 R pleural  fluid g/s >> neg HIV neg  Antimicrobials:  9/21 vanc >>9/24 9/21 aztreonam  9/21 flagyl >>9/25 9/21 cefepime >>9/25 9/26 ceftriaxone >>  Interim history/subjective:  Hypothermic 93.7 and bradycardic overnight- both improved with bair hugger Remains off vasopressors  Propofol off this morning  Remains on D10 gtt, glucose stable   Objective   Blood pressure (!) 151/105, pulse 66, temperature (!) 97 F (36.1 C), resp. rate 16, height 5\' 10"  (1.778 m), weight 72.4 kg, SpO2 100 %.    Vent Mode: PRVC FiO2 (%):  [50 %-60 %] 50 % Set Rate:  [16 bmp] 16 bmp Vt Set:  [580 mL] 580 mL PEEP:  [10 cmH20] 10 cmH20 Plateau Pressure:  [20 WPY09-98 cmH20] 20 cmH20   Intake/Output Summary (Last 24 hours) at 08/14/2020 0740 Last data filed at 08/14/2020 0700 Gross per 24 hour  Intake 1964.91 ml  Output 900 ml  Net 1064.91 ml   Filed Weights   08/08/20 1430 08/13/20 0500 08/14/20 0500  Weight: 65.3 kg 72.1 kg 72.4 kg    Examination: General:  Chronically ill adult male lying in bed in NAD HEENT: MM pink/moist, ETT/ OGT, pupils 3/reactive, mild scleral edema, anicteric  Neuro: Eyes open- does not not blink to threat, or follow commands, flaccid in extremities- LUE with some intermittent slight resistance/ spasticity to movement  CV: rr, no murmur PULM:  MV supported breaths, coarse throughout, R pigtail CT- serous drainage~150 output/ 24, no leak GI: soft, bs+, ND, foley cyu, several BMs overnight   Extremities: warm/dry, trace generalized edema, muscle atrophy noted Skin: no rashes, posterior/ sacral decub not visualized  CXR - stable lines, no change in bilateral patchy infiltrates  WBC improving 17.8-> 16.9  Resolved Hospital Problem list   N/A  Assessment & Plan:  Acute metabolic encephalopathy, Persistent hypoglycemia, new ischemic infarcts  9/26 - Baseline able to speak but blind and hard of hearing, functionally quadriplegic. - seizures ruled out by repeated EEGs.  Possible component of cefepime related encephalopathy- cefepime stopped 9/25 - MRI 9/26 showing acute bifrontal infarcts in the ACA territory and small superior vermian acute infarct; CTH overnight showed no change of multifocal evolving ischemic infarcts, CTA head/ neck neg for LVO or stenosis; ddx r/t hypoglycemia +/- hypoperfusion episodes vs seizures, also noted to be in PAF per notes, but currently remains SR - will obtain TTE w/bubble study - Neurology following, appreciate further recommendations  - AED per Neurology; keppra - seizure precautions/ ativan prn seizure - hypoglycemia management as below - goal MAP > 65 - continue serial neuro exams  - currently off sedation- on low dose propofol for biting ETT, continue to minimize as able   Acute hypoxemic respiratory failure- due to volume overloaded state of heart, third spacing, question of bilateral pneumonitis.  Patient was on brigatinib as off-label trial which has 5% incidence of pneumonitis, but given his encephalopathy and imaging findings he is more aspiration pneumonitis and third spacing due to poor baseline nutritional status.  Another possibility is progressive neurofibromas affecting secretion clearance and diaphragm strength. - Vent support, VAP prevention bundle - s/p completion of 7 day course of abx  - weaning PEEP/ FiO2 today and hopeful to start PSV trials - continue  methylprednisolone to treat any contribution of the brigatinib - goal even balance   Hypernatremia, anasarca - continue free water - remains net +4.8 L - additional metolazone today   Hypoglycemia - increase TF's per RD, currently at 20, and then can wean D10 off  Labile blood pressures- suspect autonomic instability related to heavy NF2 burden on spinal cord. - continue midodrine - map goal > 65  R pleural effusion-  probably benign transudate from poor oncotic pressure but is neutrophil predominant, regardless seems to have drained well - large layering effusion noted on left, continue diuresis, consider thora if hindering weaning efforts - can likely d/c right pigtail, slowing decreasing output, remains < 200/ 24hr - continue diuresis   NF2  Best practice:  Diet: NPO, TF per RD  Pain/Anxiety/Delirium protocol (if indicated): prn fentanyl  VAP protocol (if indicated): N/A DVT prophylaxis: lovenox GI prophylaxis: PPI Glucose control: q4h checks Mobility: BR Code Status: full Family Communication: pending Disposition: ICU    CCT:  40 mins  Kennieth Rad, ACNP [name] 08/14/2020, 7:40 AM  See Shea Evans for personal pager PCCM on call pager [phone]

## 2020-08-14 NOTE — Progress Notes (Signed)
No known AFIB. Pt has had bradycardia with AV block. Pt is now in NSR 70's. BP 107/62 (79).

## 2020-08-14 NOTE — Progress Notes (Signed)
  Echocardiogram 2D Echocardiogram has been performed.  Cameron Hale 08/14/2020, 3:26 PM

## 2020-08-14 NOTE — Progress Notes (Signed)
SLP Cancellation Note  Patient Details Name: Cameron Hale [name]: [id number] DOB: [birth year]   Cancelled treatment:       Reason Eval/Treat Not Completed: Patient not medically ready. (Pt is currently on the vent and per medical record, is not demonstrating purposeful response or interaction. SLP will follow up.)   Dozier Berkovich I. Hardin Negus, [place], [place] Office number [phone] Pager [place] 08/14/2020, 8:11 AM

## 2020-08-14 NOTE — Progress Notes (Signed)
OT Cancellation Note  Patient Details Name: ANAV LAMMERT [name]: [id number] DOB: 1976-07-07   Cancelled Treatment:    Reason Eval/Treat Not Completed: Patient not medically ready. (Pt on ventilator and non responsive. Will follow.)  Malka So 08/14/2020, 8:24 AM  Nestor Lewandowsky, OTR/L Acute Rehabilitation Services Pager: [phone] Office: [phone]

## 2020-08-14 NOTE — Progress Notes (Signed)
PT Cancellation Note  Patient Details Name: Cameron Hale [name]: [id number] DOB: 06/07/1976   Cancelled Treatment:    Reason Eval/Treat Not Completed: Patient not medically ready (pt remains on vent without purposeful response or interaction and not yet appropriate for therapy)   John Vasconcelos B Shanelle Clontz 08/14/2020, 7:48 AM  Bayard Males, PT Acute Rehabilitation Services Pager: [phone] Office: [phone]

## 2020-08-15 DIAGNOSIS — Z9289 Personal history of other medical treatment: Secondary | ICD-10-CM

## 2020-08-15 DIAGNOSIS — Z515 Encounter for palliative care: Secondary | ICD-10-CM

## 2020-08-15 DIAGNOSIS — J9600 Acute respiratory failure, unspecified whether with hypoxia or hypercapnia: Secondary | ICD-10-CM

## 2020-08-15 DIAGNOSIS — Z7189 Other specified counseling: Secondary | ICD-10-CM

## 2020-08-15 LAB — GLUCOSE, CAPILLARY
Glucose-Capillary: 117 mg/dL — ABNORMAL HIGH (ref 70–99)
Glucose-Capillary: 150 mg/dL — ABNORMAL HIGH (ref 70–99)
Glucose-Capillary: 153 mg/dL — ABNORMAL HIGH (ref 70–99)
Glucose-Capillary: 161 mg/dL — ABNORMAL HIGH (ref 70–99)
Glucose-Capillary: 165 mg/dL — ABNORMAL HIGH (ref 70–99)
Glucose-Capillary: 183 mg/dL — ABNORMAL HIGH (ref 70–99)

## 2020-08-15 LAB — CBC
HCT: 26.9 % — ABNORMAL LOW (ref 39.0–52.0)
Hemoglobin: 8.8 g/dL — ABNORMAL LOW (ref 13.0–17.0)
MCH: 25.6 pg — ABNORMAL LOW (ref 26.0–34.0)
MCHC: 32.7 g/dL (ref 30.0–36.0)
MCV: 78.2 fL — ABNORMAL LOW (ref 80.0–100.0)
Platelets: 127 10*3/uL — ABNORMAL LOW (ref 150–400)
RBC: 3.44 MIL/uL — ABNORMAL LOW (ref 4.22–5.81)
RDW: 17.8 % — ABNORMAL HIGH (ref 11.5–15.5)
WBC: 14.1 10*3/uL — ABNORMAL HIGH (ref 4.0–10.5)
nRBC: 0 % (ref 0.0–0.2)

## 2020-08-15 LAB — BASIC METABOLIC PANEL
Anion gap: 10 (ref 5–15)
BUN: 29 mg/dL — ABNORMAL HIGH (ref 6–20)
CO2: 19 mmol/L — ABNORMAL LOW (ref 22–32)
Calcium: 8 mg/dL — ABNORMAL LOW (ref 8.9–10.3)
Chloride: 117 mmol/L — ABNORMAL HIGH (ref 98–111)
Creatinine, Ser: 0.64 mg/dL (ref 0.61–1.24)
GFR calc Af Amer: >60 mL/min (ref >60)
GFR calc non Af Amer: >60 mL/min (ref >60)
Glucose, Bld: 161 mg/dL — ABNORMAL HIGH (ref 70–99)
Potassium: 3.2 mmol/L — ABNORMAL LOW (ref 3.5–5.1)
Sodium: 146 mmol/L — ABNORMAL HIGH (ref 135–145)

## 2020-08-15 LAB — MAGNESIUM: Magnesium: 2.2 mg/dL (ref 1.7–2.4)

## 2020-08-15 LAB — PHOSPHORUS: Phosphorus: 2.3 mg/dL — ABNORMAL LOW (ref 2.5–4.6)

## 2020-08-15 MED ORDER — POTASSIUM CHLORIDE 20 MEQ/15ML (10%) PO SOLN
20.0000 meq | Freq: Once | ORAL | Status: AC
  Administered 2020-08-15: 20 meq
  Filled 2020-08-15: qty 15

## 2020-08-15 MED ORDER — FREE WATER
400.0000 mL | Freq: Four times a day (QID) | Status: DC
  Administered 2020-08-15 – 2020-08-16 (×4): 400 mL

## 2020-08-15 MED ORDER — POTASSIUM CHLORIDE 10 MEQ/50ML IV SOLN
10.0000 meq | INTRAVENOUS | Status: AC
  Administered 2020-08-15 (×2): 10 meq via INTRAVENOUS
  Filled 2020-08-15 (×2): qty 50

## 2020-08-15 MED ORDER — METOLAZONE 5 MG PO TABS
5.0000 mg | ORAL_TABLET | Freq: Once | ORAL | Status: AC
  Administered 2020-08-15: 5 mg via ORAL
  Filled 2020-08-15: qty 1

## 2020-08-15 MED ORDER — PROPOFOL 1000 MG/100ML IV EMUL: 0.0000 ug/kg/min | INTRAVENOUS | Status: DC

## 2020-08-15 MED ORDER — POTASSIUM PHOSPHATES 15 MMOLE/5ML IV SOLN
15.0000 mmol | Freq: Once | INTRAVENOUS | Status: AC
  Administered 2020-08-15: 15 mmol via INTRAVENOUS
  Filled 2020-08-15: qty 5

## 2020-08-15 NOTE — Progress Notes (Signed)
OT Cancellation Note  Patient Details Name: Cameron Hale [name]: [id number] DOB: [birth year]   Cancelled Treatment:    Reason Eval/Treat Not Completed: Patient not medically ready. pt remains on vent, limited responsiveness of eye opening and not currently appropriate for acute therapy. Also noted [place] meeting scheduled today  [place], OT/L   Acute OT Clinical Specialist Acute Rehabilitation Services Pager [phone] Office [phone]  08/15/2020, 8:38 AM

## 2020-08-15 NOTE — Progress Notes (Signed)
K 3.2, Phos 2.3 Electrolytes replaced per protocol

## 2020-08-15 NOTE — Progress Notes (Signed)
This chaplain received a page for spiritual care from [facility] requesting assistance in completing MPOA.  Ebony Hail requested the chaplain phone the Pt. Wife-Carlena for clarity of the request.  This chaplain phoned Carlena using cell and home numbers in [place].  The chaplain left a Voice Mails  with spiritual care office number [phone] for follow up.

## 2020-08-15 NOTE — Consult Note (Signed)
Consultation Note Date: 08/15/2020   Patient Name: Cameron Hale  DOB: 08-Nov-1976  [name]: [id number]  Age / Sex: 44 y.o., male  PCP: Alroy Dust, L.Marlou Sa, MD Referring Physician: Candee Furbish, MD  Reason for Consultation: Establishing goals of care and Psychosocial/spiritual support  HPI/Patient Profile: 44 y.o. male  admitted on 08/08/2020   with history of neurofibromatosis type II with extensive cervical neuromas-underwent surgery March of this year and unfortunately complicated by postoperative infection and quadriplegia, seizures on keppra, blind OS w/ impaired eye movements, presenting to [facility] 9/22 with altered mental status following a staring spell. Developed hypoglycemia, hypotension and increased WOB. admitted to ICU.    Normally followed by [facility] but due to instability was brought into [place].  Toxic metabolic encephalopathy versus seizures versus status epilepticus  Bicerebral medial frontal infarcts likely secondary to involvement of anterior cerebral arteries due to compression from  long-standing NF2 changes and meningiomatosis.  Currently/ Acute on chronic respiratory failure, he is intubated, unable to follow commands and    Per neurology his prognosis is poor     Today is day 7 of this hospital stay  Family face treatment option decisions, advanced directive decisions and anticipatory care needs.   Clinical Assessment and Goals of Care:  This NP Wadie Lessen reviewed medical records, received report from team, assessed the patient and then meet at the patient's bedside along with his mother  to discuss diagnosis, prognosis, GOC, EOL wishes disposition and options.   Concept of Palliative Care was introduced as specialized medical care for people and their families living with serious illness.  If focuses on providing relief from the symptoms and stress of a serious  illness.  The goal is to improve quality of life for both the patient and the family.     A  discussion was had today regarding advanced directives.  Concepts specific to code status, artifical feeding and hydration, continued IV antibiotics and rehospitalization was had.     The difference between a aggressive medical intervention path  and a palliative comfort care path for this patient at this time was had.    Values and goals of care important to patient and family were attempted to be elicited.     Questions and concerns addressed.  Patient  encouraged to call with questions or concerns.     PMT will continue to support holistically.        A meeting scheduled for tomorrow morning with wife and mother at 22 with myself   There is no documented HPOA or AD.  Initially mother told me she was the main decision maker but after speaking with wife she made it clear that she was indeed th main decision maker for this patient her husband      SUMMARY OF RECOMMENDATIONS    Code Status/Advance Care Planning:  Full code  Family is open to all offered and available medical interventions to prolong life  Additional Recommendations (Limitations, Scope, Preferences):  Full Scope Treatment  Psycho-social/Spiritual:  Desire for further Chaplaincy support:no-family declined at this time   Prognosis:   Prognosis is poor for meaningful recovery  Discharge Planning: To Be Determined      Primary Diagnoses: Present on Admission: . AMS (altered mental status) . Paraparesis ([place]) . Mixed conductive and sensorineural hearing loss of right ear with unrestricted hearing of left ear . Quadriplegia ([place])   I have reviewed the medical record, interviewed the patient and family, and examined the patient. The following aspects are pertinent.  Past Medical History:  Diagnosis Date  . Hard of hearing   . NF2 (neurofibromatosis 2) ([place])   . Pressure ulcer   . Quadriplegia ([place])     . Seizures ([place])   . Vestibular schwannoma [facility])    Social History   Socioeconomic History  . Marital status: Married    Spouse name: Not on file  . Number of children: Not on file  . Years of education: Not on file  . Highest education level: Not on file  Occupational History  . Not on file  Tobacco Use  . Smoking status: Never Smoker  . Smokeless tobacco: Never Used  Vaping Use  . Vaping Use: Never used  Substance and Sexual Activity  . Alcohol use: Not Currently    Alcohol/week: 0.0 standard drinks  . Drug use: Never  . Sexual activity: Not Currently  Other Topics Concern  . Not on file  Social History Narrative  . Not on file   Social Determinants of Health   Financial Resource Strain:   . Difficulty of Paying Living Expenses: Not on file  Food Insecurity:   . Worried About Charity fundraiser in the Last Year: Not on file  . Ran Out of Food in the Last Year: Not on file  Transportation Needs:   . Lack of Transportation (Medical): Not on file  . Lack of Transportation (Non-Medical): Not on file  Physical Activity:   . Days of Exercise per Week: Not on file  . Minutes of Exercise per Session: Not on file  Stress:   . Feeling of Stress : Not on file  Social Connections:   . Frequency of Communication with Friends and Family: Not on file  . Frequency of Social Gatherings with Friends and Family: Not on file  . Attends Religious Services: Not on file  . Active Member of Clubs or Organizations: Not on file  . Attends Archivist Meetings: Not on file  . Marital Status: Not on file   No family history on file. Scheduled Meds: . aspirin  81 mg Per Tube Daily  . chlorhexidine gluconate (MEDLINE KIT)  15 mL Mouth Rinse BID  . Chlorhexidine Gluconate Cloth  6 each Topical Daily  . collagenase   Topical BID  . enoxaparin (LOVENOX) injection  40 mg Subcutaneous Q24H  . free water  400 mL Per Tube Q6H  . mouth rinse  15 mL Mouth Rinse 10 times per day  .  methylPREDNISolone (SOLU-MEDROL) injection  60 mg Intravenous Q12H  . midodrine  5 mg Per Tube TID  . pantoprazole (PROTONIX) IV  40 mg Intravenous QHS  . sodium chloride flush  10-40 mL Intracatheter Q12H  . sodium chloride flush  10-40 mL Intracatheter Q12H   Continuous Infusions: . cefTRIAXone (ROCEPHIN)  IV 2 g (08/14/20 1223)  . feeding supplement (VITAL AF 1.2 CAL) 1,000 mL (08/15/20 2094)  . levETIRAcetam 1,500 mg (08/15/20 0618)  . potassium PHOSPHATE IVPB (in mmol) 15 mmol (08/15/20  Ezra.Nipper)   PRN Meds:.albuterol, artificial tears, fentaNYL (SUBLIMAZE) injection, LORazepam, ondansetron **OR** ondansetron (ZOFRAN) IV, sodium chloride flush, sodium chloride flush Medications Prior to Admission:  Prior to Admission medications   Medication Sig Start Date End Date Taking? Authorizing Provider  acetaminophen (TYLENOL) 325 MG tablet Take 2 tablets (650 mg total) by mouth every 6 (six) hours as needed for mild pain or fever. 06/05/20  Yes Angiulli, Lavon Paganini, PA-C  baclofen (LIORESAL) 10 MG tablet Take 1 tablet (10 mg total) by mouth 2 (two) times daily. 06/14/20  Yes Lovorn, Megan, MD  brigatinib (ALUNBRIG) 90 & 180 MG TBPK Take 90-180 mg by mouth daily.  06/07/20  Yes [provider]  carboxymethylcellulose (REFRESH TEARS) 0.5 % SOLN Place 1 drop into both eyes daily as needed (For dry eyes).   Yes [provider]  ciprofloxacin (CIPRO) 500 MG tablet Take 500 mg by mouth 2 (two) times daily.  08/01/20  Yes [provider]  dexamethasone (DECADRON) 2 MG tablet Take 2 mg by mouth daily.  06/21/20  Yes [provider]  famotidine (PEPCID) 20 MG tablet Take 1 tablet (20 mg total) by mouth 2 (two) times daily. 07/21/20  Yes Lovorn, Jinny Blossom, MD  levETIRAcetam (KEPPRA) 500 MG tablet Take 3 tablets (1,500 mg total) by mouth 2 (two) times daily. 06/05/20 08/08/20 Yes Angiulli, Lavon Paganini, PA-C  propranolol (INDERAL) 10 MG tablet Take 1 tablet (10 mg total) by mouth 2 (two) times  daily. 06/05/20  Yes Angiulli, Lavon Paganini, PA-C  ammonium lactate (AMLACTIN) 12 % lotion Apply 1 application topically as needed for dry skin. Patient not taking: Reported on 08/08/2020 06/28/20   Felipa Furnace, DPM  bisacodyl (DULCOLAX) 10 MG suppository Place 1 suppository (10 mg total) rectally daily at 6 PM. Patient not taking: Reported on 08/08/2020 06/05/20   Cathlyn Parsons, PA-C   Allergies  Allergen Reactions  . Amoxicillin Itching  . Morphine Itching   Review of Systems  Unable to perform ROS: Intubated    Physical Exam Constitutional:      General: He is awake.     Appearance: He is obese. He is ill-appearing.     Interventions: He is intubated.  Cardiovascular:     Rate and Rhythm: Normal rate.  Pulmonary:     Effort: He is intubated.  Skin:    General: Skin is warm and dry.     Vital Signs: BP 139/78   Pulse 64   Temp 97.9 F (36.6 C)   Resp 16   Ht $R'5\' 10"'AX$  (1.778 m)   Wt 72.4 kg   SpO2 100%   BMI 22.90 kg/m  Pain Scale: CPOT   Pain Score: 0-No pain   SpO2: SpO2: 100 % O2 Device:SpO2: 100 % O2 Flow Rate: .O2 Flow Rate (L/min): 15 L/min  IO: Intake/output summary:   Intake/Output Summary (Last 24 hours) at 08/15/2020 1039 Last data filed at 08/15/2020 0800 Gross per 24 hour  Intake 1580.33 ml  Output 1740 ml  Net -159.67 ml    LBM: Last BM Date: 08/15/20 Baseline Weight: Weight: 65.3 kg Most recent weight: Weight: 72.4 kg     Palliative Assessment/Data: 20 %    Discussed with Dr Tamala Julian and bedside RN  Time In: 0930 Time Out: 1040 Time Total: 70 minutes Greater than 50%  of this time was spent counseling and coordinating care related to the above assessment and plan.  Signed by: Wadie Lessen, NP   Please contact Palliative Medicine Team  phone at [phone] for questions and concerns.  For individual provider: See Shea Evans

## 2020-08-15 NOTE — Progress Notes (Signed)
Met with family.  Images on PACS pushed through for Dr. Strowd ([facility] neurology) to review and help prognosticate with family.  If there is any chance of recovery, family/patient would want trach/PEG LTACH.  Wife and parents are interested in transfering MPOA to mother as wife is frequently unavailable by phone.  I tried multiple times to get wife on phone during this call, unable to.  Await Dr. Strowd's input prior to any final decisions.  I am back Monday to facilitate trach/PEG if that is direction we are going.  Dan Smith MD PCCM 

## 2020-08-15 NOTE — Progress Notes (Signed)
PT Cancellation Note  Patient Details Name: Cameron Hale [name]: [id number] DOB: 04/25/1976   Cancelled Treatment:    Reason Eval/Treat Not Completed: Patient not medically ready (pt remains on vent, limited responsiveness of eye opening and not currently appropriate for acute therapy. Also noted [place] meeting scheduled today)   Tondalaya Perren B Aleeya Veitch 08/15/2020, 7:54 AM Bayard Males, PT Acute Rehabilitation Services Pager: [phone] Office: [phone]

## 2020-08-15 NOTE — Progress Notes (Signed)
eLink Physician-Brief Progress Note Patient Name: Cameron Hale DOB: June 07, 1976 [name]: [id number]   Date of Service  08/15/2020  HPI/Events of Note  Frequent loose stools - Request for Flexiseal.   eICU Interventions  Plan: 1. Place Flexiseal.      Intervention Category Major Interventions: Other:  Lysle Dingwall 08/15/2020, 11:04 PM

## 2020-08-15 NOTE — Progress Notes (Signed)
STROKE TEAM PROGRESS NOTE   INTERVAL HISTORY His mother at bedside.  He remains intubated for respiratory failure and inability to protect his airway.Marland Kitchen His neurological exam remains unchanged.  He remains with eyes open but is unresponsive and not following any commands.  He continues to have involuntary twitching movements of his tongue, lips and jaw.  Echocardiogram was unremarkable. Vitals:   08/15/20 1100 08/15/20 1200 08/15/20 1300 08/15/20 1345  BP:      Pulse: 65 (!) 53 (!) 51   Resp: 16 16 16    Temp: 97.9 F (36.6 C) 98.1 F (36.7 C) 98.2 F (36.8 C)   TempSrc:      SpO2: 100% 100% 100% 100%  Weight:      Height:       CBC:  Recent Labs  Lab 08/14/20 0455 08/15/20 0413  WBC 16.9* 14.1*  HGB 9.6* 8.8*  HCT 28.9* 26.9*  MCV 78.3* 78.2*  PLT 102* 703*   Basic Metabolic Panel:  Recent Labs  Lab 08/14/20 0455 08/15/20 0413  NA 147* 146*  K 4.1 3.2*  CL 120* 117*  CO2 15* 19*  GLUCOSE 109* 161*  BUN 21* 29*  CREATININE 0.67 0.64  CALCIUM 8.4* 8.0*  MG 2.0 2.2  PHOS 2.7 2.3*   Lipid Panel:  Recent Labs  Lab 08/14/20 0455  CHOL 139  TRIG 78  HDL 45  CHOLHDL 3.1  VLDL 16  LDLCALC 78   HgbA1c:  Recent Labs  Lab 08/14/20 0455  HGBA1C 6.1*   Urine Drug Screen:  Recent Labs  Lab 08/08/20 1526  LABOPIA NONE DETECTED  COCAINSCRNUR NONE DETECTED  LABBENZ NONE DETECTED  AMPHETMU NONE DETECTED  THCU NONE DETECTED  LABBARB NONE DETECTED    Alcohol Level No results for input(s): ETH in the last 168 hours.  IMAGING past 24 hours ECHOCARDIOGRAM COMPLETE  Result Date: 08/14/2020    ECHOCARDIOGRAM REPORT   Patient Name:   Cameron Hale Date of Exam: 08/14/2020 Medical Rec #:  [id number]      Height:       70.0 in Accession #:    [id number]     Weight:       159.6 lb Date of Birth:  1976-05-16      BSA:          1.897 m Patient Age:    44 years       BP:           175/144 mmHg Patient Gender: M              HR:           70 bpm. Exam Location:  Inpatient  Procedure: 2D Echo, Cardiac Doppler, Color Doppler and Intracardiac            Opacification Agent Indications:    Stroke 434.91  History:        Patient has no prior history of Echocardiogram examinations.                 Risk Factors:Non-Smoker.  Sonographer:    Vickie Epley RDCS Referring Phys: [id number] ASHISH ARORA  Sonographer Comments: Technically difficult study due to poor echo windows and echo performed with patient supine and on artificial respirator. IMPRESSIONS  1. Left ventricular ejection fraction, by estimation, is 60 to 65%. The left ventricle has normal function. The left ventricle has no regional wall motion abnormalities. Left ventricular diastolic parameters were normal.  2. Right ventricular systolic function is normal. The  right ventricular size is normal. There is normal pulmonary artery systolic pressure.  3. The mitral valve is normal in structure. No evidence of mitral valve regurgitation. No evidence of mitral stenosis.  4. The aortic valve is normal in structure. Aortic valve regurgitation is not visualized. No aortic stenosis is present.  5. The inferior vena cava is normal in size with greater than 50% respiratory variability, suggesting right atrial pressure of 3 mmHg. Comparison(s): No prior Echocardiogram. Conclusion(s)/Recommendation(s): No intracardiac source of embolism detected on this transthoracic study. A transesophageal echocardiogram is recommended to exclude cardiac source of embolism if clinically indicated. FINDINGS  Left Ventricle: Left ventricular ejection fraction, by estimation, is 60 to 65%. The left ventricle has normal function. The left ventricle has no regional wall motion abnormalities. Definity contrast agent was given IV to delineate the left ventricular  endocardial borders. The left ventricular internal cavity size was normal in size. There is no left ventricular hypertrophy. Left ventricular diastolic parameters were normal. Right Ventricle: The right  ventricular size is normal. No increase in right ventricular wall thickness. Right ventricular systolic function is normal. There is normal pulmonary artery systolic pressure. The tricuspid regurgitant velocity is 1.72 m/s, and  with an assumed right atrial pressure of 8 mmHg, the estimated right ventricular systolic pressure is 03.4 mmHg. Left Atrium: Left atrial size was normal in size. Right Atrium: Right atrial size was normal in size. Pericardium: There is no evidence of pericardial effusion. Mitral Valve: The mitral valve is normal in structure. No evidence of mitral valve regurgitation. No evidence of mitral valve stenosis. Tricuspid Valve: The tricuspid valve is normal in structure. Tricuspid valve regurgitation is mild . No evidence of tricuspid stenosis. Aortic Valve: The aortic valve is normal in structure. Aortic valve regurgitation is not visualized. No aortic stenosis is present. Pulmonic Valve: The pulmonic valve was normal in structure. Pulmonic valve regurgitation is not visualized. No evidence of pulmonic stenosis. Aorta: The aortic root is normal in size and structure. Venous: The inferior vena cava is normal in size with greater than 50% respiratory variability, suggesting right atrial pressure of 3 mmHg. IAS/Shunts: No atrial level shunt detected by color flow Doppler.   Diastology LV e' medial:    8.59 cm/s LV E/e' medial:  6.9 LV e' lateral:   11.60 cm/s LV E/e' lateral: 5.1  RIGHT VENTRICLE RV S prime:     13.90 cm/s TAPSE (M-mode): 1.7 cm LEFT ATRIUM           Index       RIGHT ATRIUM          Index LA Vol (A2C): 13.3 ml 7.01 ml/m  RA Area:     9.73 cm LA Vol (A4C): 31.3 ml 16.50 ml/m RA Volume:   20.70 ml 10.91 ml/m  AORTIC VALVE LVOT Vmax:   74.40 cm/s LVOT Vmean:  49.700 cm/s LVOT VTI:    0.160 m MITRAL VALVE               TRICUSPID VALVE MV Area (PHT): 4.21 cm    TR Peak grad:   11.8 mmHg MV Decel Time: 180 msec    TR Vmax:        172.00 cm/s MV E velocity: 59.60 cm/s MV A velocity:  37.70 cm/s  SHUNTS MV E/A ratio:  1.58        Systemic VTI: 0.16 m Ena Dawley MD Electronically signed by Ena Dawley MD Signature Date/Time: 08/14/2020/3:35:19 PM    Final  PHYSICAL EXAM Obese middle-aged Caucasian male who is sedated and intubated.  Not in distress.  He is having continuous involuntary twitching's of his jaw and lip. . Afebrile. Head is nontraumatic. Neck is supple without bruit.    Cardiac exam no murmur or gallop. Lungs are clear to auscultation. Distal pulses are well felt. Neurological Exam :  Patient is comatose, sedated intubated.  Eyes are partially open to sternal rub.  He does not follow any commands.  Eyes in primary position.  Pupils equal reactive.  Does not blink to threat bilaterally.  Fundi not visualized.  Tongue is midline with involuntary twitching's noted involving tongue, jaw lips and tongue.  Motor system exam shows no spontaneous movements.  Withdraws barely to sternal rub with partial flexion in the lower extremities and slight in the right upper but none in the left upper extremities.  Deep tendon reflexes are depressed.  Plantars of both mute.   ASSESSMENT/PLAN [name] is a 44 y.o. 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 Developed hypoglycemia, hypotension and increased WOB. admitted to ICU.    Toxic metabolic encephalopathy versus seizures versus status epilepticus  Bicerebral medial frontal infarcts likely secondary to involvement of anterior cerebral arteries due to compression from  long-standing NF2 changes and meningiomatosis   unresponsive     CT head ? R PLIC hypodensity. No new abnormality. Extensive NF2 changes   MRI  B frontal  ACA infarcts. Small superior vermian infarct. Chronic NF2 changes. parafalcine meningioma w/ bifrontal edema.   CT head no new ischemia . Extensive NF2   CTA head & neck no LVO or significant stenosis. Multifocal lung opacities concerning for PNA. Large L pleural effusion  2D Echo w/ bubble normal ejection fraction.  No cardiac source of embolism.    LDL 78  HgbA1c 6.1  VTE prophylaxis - Lovenox 40 mg sq daily   No antithrombotic prior to admission, now on No antithrombotic. Add low dose aspirin via tube  Therapy recommendations:  pending   Disposition:  pending   Acute Hypoxemic Respiratory Failure  Intubated, sedated  CCM on board  Atrial Fibrillation  Noted at time of intubation 9/25 by Dr. Tamala Julian  Not an [facility] candidate given vascular cerebral lesion   RN clarified on tele no hx AF only bradycardia w/ AV block   Labile BP  Variable  . From stroke standpoint, ok for Permissive hypertension (OK if < 220/120) but gradually normalize in 5-7 days . Long-term BP goal normotensive  Hyperlipidemia  Home meds:  No statin  LDL 78, goal < 70  Add statin (lipitor 40) at discharge based on plan of care   Hypoglycemia  HgbA1c 6.1  Dysphagia . NPO   Other Stroke Risk Factors  Hx ETOH use  Other Active Problems  NF2  Acute metabolic encephalopathy   Hyponatremia, anasarca  R pleural effusions  Seizures on keppra PTA. EEG neg [facility] day # 7 Patient has bilateral medial frontal infarcts likely due to involvement of anterior cerebral arteries due to his extensive meningiomatosis from his neurofibromatosis.  Atrial fibrillation was transiently documented during intubation and could also have contributed but patient is not a good long-term anticoagulation candidate given extensive vascular brain tumors.  Continue aspirin. .  Continue  Keppra for seizures.  Patient's prognosis is quite poor given baseline quadriplegia and now comatose state.  Patient is likely  going to survive with very poor and limited quality of life requiring tracheostomy, PEG tube and prolonged ventilatory support and 24-hour nursing care.  I explained this to the patient's mother who is not willing to give up on him and wants to hang on to him as long as possible..  Family to meet with primary team and palliative care to discuss goals of care.  Discussed with Dr. Tamala Julian and answered questions. This patient is critically ill and at significant risk of neurological worsening, death and care requires constant monitoring of vital signs, hemodynamics,respiratory and cardiac monitoring, extensive review of multiple databases, frequent neurological assessment, discussion with family, other specialists and medical decision making of high complexity.I have made any additions or clarifications directly to the above note.This critical care time does not reflect procedure time, or teaching time or supervisory time of PA/NP/Med Resident etc but could involve care discussion time.  I spent 30 minutes of neurocritical care time  in the care of  this patient.    Antony Contras, MD  To contact Stroke Continuity provider, please refer to [email or web address] After hours, contact General Neurology

## 2020-08-15 NOTE — Progress Notes (Signed)
The Pt. Spouse-[name] returned the chaplain's voice mail from [phone].  The chaplain understands from talking to [facility], she didn't authorize any change HCPOA at this time.  The chaplain offered F/U spiritual care as needed.

## 2020-08-15 NOTE — Progress Notes (Signed)
NAME:  Cameron Hale, [name]:  [id number], DOB:  [birth year], LOS: 7 ADMISSION DATE:  08/08/2020, CONSULTATION DATE:  08/12/20 REFERRING MD:  Opyd, CHIEF COMPLAINT:  unresponsive   Brief History   44 year old man with quadriplegia, seizure disorder presenting with suspected recurrent seizures, hypoxemia, and hypotension.  History of present illness   44 year old man with hx of type 2 neurofibromatosis, quadriplegia, blindness, hearing loss (baseline only able to talk) presenting with recurrent unresponsive episodes as well as generalized seizure activity.  Brought to [facility] for further management.  Unfortunately less responsive this AM with hypotension and hypoxemia so PCCM consulted.  Patient unable to provide any history.  Normally followed by [facility] but due to instability was brought into [place].  Past Medical History  -Neurofibromatosis 2 -Neurosurgical intervention March 2021 with postoperative course complicated by functional quadriplegia, blindness, hearing loss (baseline only able to talk)  Decubitus pressue ulcer  Significant Hospital Events   9/21 admitted to [facility] 9/25 ETT, PICC, arterial line, right chest tube  Consults:  Neurology  Procedures:   9/24 R PICC >> 9/25 ETT >> 9/25 L radial aline >> 9/25 R pigtail CT >>  9/21 EEG no seizures 9/26 cEEG d/c- > no seizures  Significant Diagnostic Tests:  CT Brain 9/21 IMPRESSION: No evidence of acute intracranial hemorrhage or acute demarcated cortical infarct. Extensive meningiomatosis more fully characterized on the recent prior brain MRI of 05/24/2020. Most notably, this includes a large parafalcine meningioma as well as extensive en plaque meningiomas greatest along the anterior and superior cerebral convexities. Unchanged prominent vasogenic edema within the underlying mid-to-anterior frontal lobes and crossing the corpus callosum. As before, there is herniation of the rectus gyri into the  sella turcica. 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 A known right vestibular schwannoma was better appreciated on the prior MRI. Questionable asymmetric enlargement of the right hippocampus was also better appreciated on the prior MRI.  9/26 CTH >> 1. Motion degraded exam. 2. Question an approximate 1.3 cm area of increased hypodensity involving the posterior limb of the right internal capsule. While this finding may be artifactual in nature given motion artifact on this exam, a possible evolving ischemic infarct is difficult to exclude, and could be considered in the correct clinical setting. 3. No other new acute intracranial abnormality. 4. Underlying extensive changes related to NF 2 with meningiomatosis and associated bifrontal edema, stable.  9/26 MR brain >> Cortically based acute infarcts involving the bilateral frontal lobes most prominent in the ACA territory.  Small superior vermian acute infarct.  Chronic sequela of NF 2, grossly unchanged. No new or enlarging intracranial lesions however lack of intravenous contrast limits evaluation.  Dominant parafalcine meningioma and bifrontal edema, grossly unchanged in size.  9/27 CTH 1. No significant interval change of multifocal evolving ischemic infarcts, better evaluated on recent brain MRI. No hemorrhagic transformation or other complication. 2. Extensive underlying changes related to NF2, stable. No new intracranial abnormality.  9/27 CTA head/ neck >> 1. Negative CTA for large vessel occlusion. No hemodynamically significant or correctable stenosis. The intracranial circulation is diffusely diminutive but remains patent. 2. Multifocal opacities throughout the visualized lungs, concerning for multifocal infection/pneumonia. 3. Large layering left pleural effusion, partially  visualized.  Micro Data:  9/21 COVID neg 9/21 UC neg 9/21 BCx 2 >> neg 9/24 MRSA neg 9/25 R pleural fluid cx >>  9/25 R pleural  fluid g/s >> neg HIV neg  Antimicrobials:  9/21 vanc >>9/24 9/21 aztreonam  9/21 flagyl >>9/25 9/21 cefepime >>9/25 9/26 ceftriaxone >>  Interim history/subjective:  No events, remains awake but not responding to any stimuli that I can tell.  Objective   Blood pressure 139/78, pulse 64, temperature 97.9 F (36.6 C), resp. rate 16, height 5\' 10"  (1.778 m), weight 72.4 kg, SpO2 100 %.    Vent Mode: PRVC FiO2 (%):  [40 %] 40 % Set Rate:  [16 bmp] 16 bmp Vt Set:  [580 mL] 580 mL PEEP:  [5 cmH20] 5 cmH20 Plateau Pressure:  [14 cmH20-17 cmH20] 14 cmH20   Intake/Output Summary (Last 24 hours) at 08/15/2020 0931 Last data filed at 08/15/2020 0800 Gross per 24 hour  Intake 1690.3 ml  Output 1815 ml  Net -124.7 ml   Filed Weights   08/08/20 1430 08/13/20 0500 08/14/20 0500  Weight: 65.3 kg 72.1 kg 72.4 kg    Examination: Constitutional: chronically ill man in no acute distress Eyes: eyes are anicteric, reactive to light Ears, nose, mouth, and throat: mucous membranes dry, ETT in place Cardiovascular: heart sounds are regular, ext are warm to touch. no edema, +muscle wasting and contractures Respiratory: diminished bases, R chest tube in place with serous output Gastrointestinal: abdomen is soft with + BS Skin: No rashes, normal turgor Neurologic: he has rhythmic movement of jaw, eyes open and staring to right, no eye response to pain, visual stimuli, auditory stimuli, quadriplegic Psychiatric: cannot assess   No new X-ray WBC downtrending H/H slightly down  Resolved Hospital Problem list   N/A  Assessment & Plan:  Acute metabolic encephalopathy, Persistent hypoglycemia, new ischemic infarcts 9/26- Baseline able to speak but blind and hard of hearing, functionally quadriplegic.  Seizures ruled out by repeated EEGs.  Possible component of  cefepime related encephalopathy- cefepime stopped 9/25.  Bilateral ACA strokes likely due to mass effect of patient's meningiomatosis.  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  We have family meeting today at [place] but initial discussions with family indicate they may want more time to see if any neurological recovery. - Appreciate neurology input - Will see if neurology thinks TEE would be beneficial or if TTE is enough - Continue to try to limit sedation and see how he does with time  Acute hypoxemic respiratory failure- Patient was on brigatinib as off-label trial which has 5% incidence of pneumonitis, but given his encephalopathy and imaging findings he is more aspiration pneumonitis and third spacing due to poor baseline nutritional status.  Another possibility is progressive neurofibromas affecting secretion clearance and diaphragm strength. - Vent support, VAP prevention bundle - s/p completion of 7 day course of abx  - weaning PEEP/ FiO2 today and hopeful to start PSV trials - continue  methylprednisolone to treat any contribution of the brigatinib - goal even to negative balance  - Check AM CXR - Depending on family decisions, may need tracheostomy and LTACH  Hypernatremia, anasarca - increase free water - remains net +4.8 L - additional metolazone today   Hypoglycemia - trial off D10  Labile blood pressures- suspect autonomic instability related to heavy NF2 burden on spinal cord. - continue midodrine - map goal > 65  R pleural effusion- probably benign transudate from poor oncotic pressure but is neutrophil predominant, regardless seems to have drained well - recheck AM CXR, consider DC R chest tube tomorrow  NF2- progressive, treatment currently on hold  Best practice:  Diet: NPO,  TF per RD  Pain/Anxiety/Delirium protocol (if indicated): prn fentanyl  VAP protocol (if indicated): N/A DVT prophylaxis: lovenox GI prophylaxis: PPI Glucose control: q4h  checks Mobility: BR Code Status: full Family Communication: family meeting today 4PM Disposition: ICU   The patient is critically ill with multiple organ systems failure and requires high complexity decision making for assessment and support, frequent evaluation and titration of therapies, application of advanced monitoring technologies and extensive interpretation of multiple databases. Critical Care Time devoted to patient care services described in this note independent of APP/resident time (if applicable)  is 35 minutes.   Erskine Emery MD [facility] 08/15/2020 9:40 AM Personal pager: [phone] If unanswered, please page CCM On-call: [phone]

## 2020-08-16 ENCOUNTER — Inpatient Hospital Stay (HOSPITAL_COMMUNITY): Payer: Medicare Other

## 2020-08-16 DIAGNOSIS — J9621 Acute and chronic respiratory failure with hypoxia: Secondary | ICD-10-CM

## 2020-08-16 DIAGNOSIS — Z9289 Personal history of other medical treatment: Secondary | ICD-10-CM

## 2020-08-16 DIAGNOSIS — Z7189 Other specified counseling: Secondary | ICD-10-CM

## 2020-08-16 DIAGNOSIS — Z515 Encounter for palliative care: Secondary | ICD-10-CM

## 2020-08-16 LAB — BASIC METABOLIC PANEL
Anion gap: 12 (ref 5–15)
Anion gap: 8 (ref 5–15)
BUN: 32 mg/dL — ABNORMAL HIGH (ref 6–20)
BUN: 33 mg/dL — ABNORMAL HIGH (ref 6–20)
CO2: 19 mmol/L — ABNORMAL LOW (ref 22–32)
CO2: 22 mmol/L (ref 22–32)
Calcium: 7.6 mg/dL — ABNORMAL LOW (ref 8.9–10.3)
Calcium: 7.7 mg/dL — ABNORMAL LOW (ref 8.9–10.3)
Chloride: 109 mmol/L (ref 98–111)
Chloride: 110 mmol/L (ref 98–111)
Creatinine, Ser: 0.63 mg/dL (ref 0.61–1.24)
Creatinine, Ser: 0.49 mg/dL — ABNORMAL LOW (ref 0.61–1.24)
GFR calc Af Amer: >60 mL/min (ref >60)
GFR calc Af Amer: >60 mL/min (ref >60)
GFR calc non Af Amer: >60 mL/min (ref >60)
GFR calc non Af Amer: >60 mL/min (ref >60)
Glucose, Bld: 169 mg/dL — ABNORMAL HIGH (ref 70–99)
Glucose, Bld: 174 mg/dL — ABNORMAL HIGH (ref 70–99)
Potassium: 3.1 mmol/L — ABNORMAL LOW (ref 3.5–5.1)
Potassium: 4.3 mmol/L (ref 3.5–5.1)
Sodium: 140 mmol/L (ref 135–145)
Sodium: 140 mmol/L (ref 135–145)

## 2020-08-16 LAB — TRIGLYCERIDES: Triglycerides: 117 mg/dL (ref <150)

## 2020-08-16 LAB — GLUCOSE, CAPILLARY
Glucose-Capillary: 133 mg/dL — ABNORMAL HIGH (ref 70–99)
Glucose-Capillary: 144 mg/dL — ABNORMAL HIGH (ref 70–99)
Glucose-Capillary: 162 mg/dL — ABNORMAL HIGH (ref 70–99)
Glucose-Capillary: 165 mg/dL — ABNORMAL HIGH (ref 70–99)
Glucose-Capillary: 167 mg/dL — ABNORMAL HIGH (ref 70–99)
Glucose-Capillary: 182 mg/dL — ABNORMAL HIGH (ref 70–99)

## 2020-08-16 LAB — CBC
HCT: 25.6 % — ABNORMAL LOW (ref 39.0–52.0)
Hemoglobin: 8.4 g/dL — ABNORMAL LOW (ref 13.0–17.0)
MCH: 25.8 pg — ABNORMAL LOW (ref 26.0–34.0)
MCHC: 32.8 g/dL (ref 30.0–36.0)
MCV: 78.5 fL — ABNORMAL LOW (ref 80.0–100.0)
Platelets: 121 10*3/uL — ABNORMAL LOW (ref 150–400)
RBC: 3.26 MIL/uL — ABNORMAL LOW (ref 4.22–5.81)
RDW: 17.8 % — ABNORMAL HIGH (ref 11.5–15.5)
WBC: 10 10*3/uL (ref 4.0–10.5)
nRBC: 0 % (ref 0.0–0.2)

## 2020-08-16 LAB — CULTURE, RESPIRATORY W GRAM STAIN

## 2020-08-16 LAB — MAGNESIUM: Magnesium: 1.9 mg/dL (ref 1.7–2.4)

## 2020-08-16 LAB — PHOSPHORUS: Phosphorus: 2.7 mg/dL (ref 2.5–4.6)

## 2020-08-16 MED ORDER — MAGNESIUM SULFATE 2 GM/50ML IV SOLN
2.0000 g | Freq: Once | INTRAVENOUS | Status: AC
  Administered 2020-08-16: 2 g via INTRAVENOUS
  Filled 2020-08-16: qty 50

## 2020-08-16 MED ORDER — FUROSEMIDE 10 MG/ML IJ SOLN
40.0000 mg | Freq: Two times a day (BID) | INTRAMUSCULAR | Status: DC
  Administered 2020-08-16 (×2): 40 mg via INTRAVENOUS
  Filled 2020-08-16 (×2): qty 4

## 2020-08-16 MED ORDER — HYDRALAZINE HCL 25 MG PO TABS: 25.0000 mg | ORAL_TABLET | Freq: Four times a day (QID) | ORAL | Status: DC | PRN

## 2020-08-16 MED ORDER — POTASSIUM CHLORIDE 20 MEQ/15ML (10%) PO SOLN
20.0000 meq | ORAL | Status: AC
  Administered 2020-08-16 (×2): 20 meq
  Filled 2020-08-16 (×2): qty 15

## 2020-08-16 MED ORDER — FREE WATER
200.0000 mL | Freq: Four times a day (QID) | Status: DC
  Administered 2020-08-16 – 2020-08-17 (×4): 200 mL

## 2020-08-16 MED ORDER — INSULIN ASPART 100 UNIT/ML ~~LOC~~ SOLN
0.0000 [IU] | SUBCUTANEOUS | Status: DC
  Administered 2020-08-16: 1 [IU] via SUBCUTANEOUS
  Administered 2020-08-16: 2 [IU] via SUBCUTANEOUS
  Administered 2020-08-17 (×2): 1 [IU] via SUBCUTANEOUS
  Administered 2020-08-17: 2 [IU] via SUBCUTANEOUS
  Administered 2020-08-17 (×2): 1 [IU] via SUBCUTANEOUS
  Administered 2020-08-17: 2 [IU] via SUBCUTANEOUS
  Administered 2020-08-17 – 2020-08-18 (×6): 1 [IU] via SUBCUTANEOUS
  Administered 2020-08-19 (×2): 2 [IU] via SUBCUTANEOUS
  Administered 2020-08-19 (×2): 1 [IU] via SUBCUTANEOUS
  Administered 2020-08-20: 2 [IU] via SUBCUTANEOUS
  Administered 2020-08-20 – 2020-08-21 (×7): 1 [IU] via SUBCUTANEOUS
  Administered 2020-08-23 – 2020-08-24 (×3): 2 [IU] via SUBCUTANEOUS
  Administered 2020-08-24: 1 [IU] via SUBCUTANEOUS
  Administered 2020-08-24: 2 [IU] via SUBCUTANEOUS
  Administered 2020-08-24: 1 [IU] via SUBCUTANEOUS
  Administered 2020-08-24: 2 [IU] via SUBCUTANEOUS
  Administered 2020-08-25: 1 [IU] via SUBCUTANEOUS
  Administered 2020-08-25: 2 [IU] via SUBCUTANEOUS
  Administered 2020-08-25 (×2): 1 [IU] via SUBCUTANEOUS
  Administered 2020-08-25 (×2): 2 [IU] via SUBCUTANEOUS
  Administered 2020-08-25 – 2020-08-26 (×3): 1 [IU] via SUBCUTANEOUS
  Administered 2020-08-26: 2 [IU] via SUBCUTANEOUS
  Administered 2020-08-26 – 2020-08-27 (×4): 1 [IU] via SUBCUTANEOUS
  Administered 2020-08-27: 2 [IU] via SUBCUTANEOUS
  Administered 2020-08-27: 1 [IU] via SUBCUTANEOUS
  Administered 2020-08-28 (×2): 2 [IU] via SUBCUTANEOUS
  Administered 2020-08-28 – 2020-08-29 (×5): 1 [IU] via SUBCUTANEOUS
  Administered 2020-08-29: 2 [IU] via SUBCUTANEOUS
  Administered 2020-08-29: 1 [IU] via SUBCUTANEOUS
  Administered 2020-08-29 (×2): 2 [IU] via SUBCUTANEOUS
  Administered 2020-08-29: 1 [IU] via SUBCUTANEOUS
  Administered 2020-08-30: 2 [IU] via SUBCUTANEOUS
  Administered 2020-08-30: 1 [IU] via SUBCUTANEOUS
  Administered 2020-08-30: 2 [IU] via SUBCUTANEOUS
  Administered 2020-08-30 – 2020-09-01 (×10): 1 [IU] via SUBCUTANEOUS
  Administered 2020-09-01: 2 [IU] via SUBCUTANEOUS
  Administered 2020-09-01: 1 [IU] via SUBCUTANEOUS
  Administered 2020-09-02 (×4): 2 [IU] via SUBCUTANEOUS
  Administered 2020-09-02: 1 [IU] via SUBCUTANEOUS
  Administered 2020-09-03: 2 [IU] via SUBCUTANEOUS
  Administered 2020-09-03 – 2020-09-04 (×7): 1 [IU] via SUBCUTANEOUS
  Administered 2020-09-04: 2 [IU] via SUBCUTANEOUS
  Administered 2020-09-04 – 2020-09-07 (×8): 1 [IU] via SUBCUTANEOUS
  Administered 2020-09-07: 2 [IU] via SUBCUTANEOUS
  Administered 2020-09-08: 1 [IU] via SUBCUTANEOUS

## 2020-08-16 MED ORDER — POTASSIUM CHLORIDE 10 MEQ/50ML IV SOLN
10.0000 meq | INTRAVENOUS | Status: AC
  Administered 2020-08-16 (×4): 10 meq via INTRAVENOUS
  Filled 2020-08-16 (×4): qty 50

## 2020-08-16 MED ORDER — PANTOPRAZOLE SODIUM 40 MG PO PACK
40.0000 mg | PACK | Freq: Every day | ORAL | Status: DC
  Administered 2020-08-16 – 2020-08-20 (×5): 40 mg
  Filled 2020-08-16 (×5): qty 20

## 2020-08-16 NOTE — Plan of Care (Signed)
  Problem: Clinical Measurements: Goal: Will remain free from infection Outcome: Progressing Goal: Diagnostic test results will improve Outcome: Progressing Goal: Cardiovascular complication will be avoided Outcome: Progressing   Problem: Activity: Goal: Risk for activity intolerance will decrease Outcome: Progressing   Problem: Nutrition: Goal: Adequate nutrition will be maintained Outcome: Progressing   Problem: Coping: Goal: Level of anxiety will decrease Outcome: Progressing   Problem: Elimination: Goal: Will not experience complications related to urinary retention Outcome: Progressing   Problem: Pain Managment: Goal: General experience of comfort will improve Outcome: Progressing   Problem: Safety: Goal: Ability to remain free from injury will improve Outcome: Progressing   Problem: Skin Integrity: Goal: Risk for impaired skin integrity will decrease Outcome: Progressing   Problem: Education: Goal: Knowledge of General Education information will improve Description: Including pain rating scale, medication(s)/side effects and non-pharmacologic comfort measures Outcome: Not Progressing   Problem: Health Behavior/Discharge Planning: Goal: Ability to manage health-related needs will improve Outcome: Not Progressing   Problem: Clinical Measurements: Goal: Ability to maintain clinical measurements within normal limits will improve Outcome: Not Progressing Goal: Respiratory complications will improve Outcome: Not Progressing   Problem: Elimination: Goal: Will not experience complications related to bowel motility Outcome: Not Progressing

## 2020-08-16 NOTE — Evaluation (Signed)
Physical Therapy Evaluation/ Discharge Patient Details Name: Cameron Hale [name]: [id number] DOB: [birth year] Today's Date: 08/16/2020   History of Present Illness  44 yo admitted 9/21 with unresponsiveness and seizures. Intubated 9/25. MRI with bilcerebral frontal infarcts. PMhx: neurofibromatosis with neurosurgical intervention march 2021 with resultant quadriplegia, seizure disorder,left eye blindness, hearing loss  Clinical Impression  Pt is a quadraplegic at baseline with max-total assist for bed mobility, total lift to chair and assist for Power WC use. Pt currently without functional interaction or attention on vent. Pt with significantly limited capacity to assist with acute therapy and currently not appropriate for further services. Pt with good PROM and recommend continued ROM with nursing staff. Pt with necessary DME at home for total lift and total assist with need for 24 hr care. Should pt cognition and arousal change please reorder.     Follow Up Recommendations LTACH;Supervision/Assistance - 24 hour    Equipment Recommendations  None recommended by PT (pt with DME)    Recommendations for Other Services       Precautions / Restrictions Precautions Precautions: Other (comment) Precaution Comments: vent, ETT, quadriplegia Restrictions Weight Bearing Restrictions: Yes RLE Weight Bearing: Non weight bearing LLE Weight Bearing: Non weight bearing      Mobility  Bed Mobility               General bed mobility comments: deferred secondary to lack of interaction/command following  Transfers                    Ambulation/Gait                Stairs            Wheelchair Mobility    Modified Rankin (Stroke Patients Only)       Balance                                             Pertinent Vitals/Pain Pain Assessment: Faces Faces Pain Scale: Hurts little more Pain Location: slight withdrawal and apparent awareness of  noxious stimuli to RUE only    Home Living Family/patient expects to be discharged to:: Private residence Living Arrangements: Parent;Other relatives Available Help at Discharge: Family;Friend(s);Available 24 hours/day Type of Home: House Home Access: Ramped entrance     Home Layout: One level Home Equipment: Hospital bed;Other (comment);Wheelchair - power      Prior Production designer, theatre/television/film / Transfers Assistance Needed: max-total assist for bed mobility, hoyer lift to power WC     Comments: home setup and PLOF taken from CIR admission, no family present and pt unable to provide     Hand Dominance        Extremity/Trunk Assessment   Upper Extremity Assessment Upper Extremity Assessment: Defer to OT evaluation    Lower Extremity Assessment Lower Extremity Assessment: RLE deficits/detail;LLE deficits/detail RLE Deficits / Details: no AROM or withdrawal to pain. PROM WFL LLE Deficits / Details: no AROM or withdrawal to pain. PROM [facility]       Communication      Cognition Arousal/Alertness: Lethargic                                     General Comments: pt lethargic with very limited eye opening and even with assisted  eye opening upward dysconjugate gaze. pt with limited blink to threat, no tracking. no command following of any extremity or head/neck/face movement      General Comments      Exercises     Assessment/Plan    PT Assessment Patent does not need any further PT services  PT Problem List         PT Treatment Interventions      PT Goals (Current goals can be found in the Care Plan section)  Acute Rehab PT Goals PT Goal Formulation: All assessment and education complete, DC therapy    Frequency     Barriers to discharge        Co-evaluation PT/OT/SLP Co-Evaluation/Treatment: Yes Reason for Co-Treatment: Complexity of the patient's impairments (multi-system involvement) PT goals addressed during session: Mobility/safety with  mobility         AM-PAC PT "6 Clicks" Mobility  Outcome Measure Help needed turning from your back to your side while in a flat bed without using bedrails?: Total Help needed moving from lying on your back to sitting on the side of a flat bed without using bedrails?: Total Help needed moving to and from a bed to a chair (including a wheelchair)?: Total Help needed standing up from a chair using your arms (e.g., wheelchair or bedside chair)?: Total Help needed to walk in hospital room?: Total Help needed climbing 3-5 steps with a railing? : Total 6 Click Score: 6    End of Session   Activity Tolerance: Patient tolerated treatment well Patient left: in bed Nurse Communication: Need for lift equipment PT Visit Diagnosis: Other abnormalities of gait and mobility (R26.89);Other symptoms and signs involving the nervous system (R29.898)    Time: 8309-4076 PT Time Calculation (min) (ACUTE ONLY): 15 min   Charges:   PT Evaluation $PT Eval Moderate Complexity: 1 Mod          Cameron Hale P, PT Acute Rehabilitation Services Pager: [phone] Office: [phone]   Cameron Hale Cameron Hale 08/16/2020, 11:23 AM

## 2020-08-16 NOTE — Progress Notes (Signed)
STROKE TEAM PROGRESS NOTE   INTERVAL HISTORY His sisterr at bedside.  He remains intubated for respiratory failure and inability to protect his airway.Marland Kitchen His neurological exam remains unchanged.  He remains with eyes open but is unresponsive and not following any commands.  He continues to have involuntary twitching movements of his tongue, lips and jaw and today both upper extremities as well..   No neurological changes. Vital signs are stable.. Vitals:   08/16/20 0630 08/16/20 0900 08/16/20 1000 08/16/20 1110  BP:  115/78 116/79 109/74  Pulse: 63 62 65 69  Resp: 16 16 15 15   Temp: 98.1 F (36.7 C) 98.2 F (36.8 C) 98.2 F (36.8 C) 98.2 F (36.8 C)  TempSrc:      SpO2: 100% 100% 100% 100%  Weight:      Height:       CBC:  Recent Labs  Lab 08/15/20 0413 08/16/20 0411  WBC 14.1* 10.0  HGB 8.8* 8.4*  HCT 26.9* 25.6*  MCV 78.2* 78.5*  PLT 127* 160*   Basic Metabolic Panel:  Recent Labs  Lab 08/15/20 0413 08/16/20 0411  NA 146* 140  K 3.2* 3.1*  CL 117* 109  CO2 19* 19*  GLUCOSE 161* 169*  BUN 29* 32*  CREATININE 0.64 0.63  CALCIUM 8.0* 7.6*  MG 2.2 1.9  PHOS 2.3* 2.7   Lipid Panel:  Recent Labs  Lab 08/14/20 0455  CHOL 139  TRIG 78  HDL 45  CHOLHDL 3.1  VLDL 16  LDLCALC 78   HgbA1c:  Recent Labs  Lab 08/14/20 0455  HGBA1C 6.1*   Urine Drug Screen:  No results for input(s): LABOPIA, COCAINSCRNUR, LABBENZ, AMPHETMU, THCU, LABBARB in the last 168 hours.  Alcohol Level No results for input(s): ETH in the last 168 hours.  IMAGING past 24 hours DG Chest 1 View  Result Date: 08/16/2020 CLINICAL DATA:  Check endotracheal tube placement EXAM: CHEST  1 VIEW COMPARISON:  08/14/2020 FINDINGS: Cardiac shadow is stable. Postsurgical changes are again seen. Endotracheal tube, gastric catheter and right-sided PICC line are again seen and stable. Right-sided pigtail catheter is noted as well. Small left-sided pleural effusion is seen. No focal confluent infiltrate is  noted. IMPRESSION: New left-sided pleural effusion when compare with the prior exam. Tubes and lines as described. Electronically Signed   By: Inez Catalina M.D.   On: 08/16/2020 08:14    PHYSICAL EXAM Obese middle-aged Caucasian male who is sedated and intubated.  Not in distress.  He is having continuous involuntary twitching's of his jaw and lip. . Afebrile. Head is nontraumatic. Neck is supple without bruit.    Cardiac exam no murmur or gallop. Lungs are clear to auscultation. Distal pulses are well felt. Neurological Exam :  Patient is comatose, sedated intubated.  Eyes are partially open to sternal rub.  He does not follow any commands.  Eyes in primary position.  Pupils equal reactive.  Does not blink to threat bilaterally.  Fundi not visualized.  Tongue is midline with involuntary twitching's noted involving tongue, jaw lips and tongue.  Motor system exam shows no spontaneous movements.  Withdraws barely to sternal rub with partial flexion in the lower extremities and slight in the right upper but none in the left upper extremities.  Deep tendon reflexes are depressed.  Plantars of both mute.   ASSESSMENT/PLAN Mr. TYHIR SCHWAN is a 44 y.o. 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 Developed hypoglycemia, hypotension and increased WOB. admitted to ICU.    Toxic metabolic encephalopathy versus seizures versus status epilepticus  Bicerebral medial frontal infarcts likely secondary to involvement of anterior cerebral arteries due to compression from  long-standing NF2 changes and meningiomatosis   unresponsive     CT head ? R PLIC hypodensity. No new abnormality. Extensive NF2 changes   MRI  B  frontal ACA infarcts. Small superior vermian infarct. Chronic NF2 changes. parafalcine meningioma w/ bifrontal edema.   CT head no new ischemia . Extensive NF2   CTA head & neck no LVO or significant stenosis. Multifocal lung opacities concerning for PNA. Large L pleural effusion  2D Echo w/ bubble normal ejection fraction.  No cardiac source of embolism.    LDL 78  HgbA1c 6.1  VTE prophylaxis - Lovenox 40 mg sq daily   No antithrombotic prior to admission, now on No antithrombotic. Add low dose aspirin via tube  Therapy recommendations:  pending   Disposition:  pending   Acute Hypoxemic Respiratory Failure  Intubated, sedated  CCM on board  Atrial Fibrillation  Noted at time of intubation 9/25 by Dr. Tamala Julian  Not an [facility] candidate given vascular cerebral lesion   RN clarified on tele no hx AF only bradycardia w/ AV block   Labile BP  Variable  . From stroke standpoint, ok for Permissive hypertension (OK if < 220/120) but gradually normalize in 5-7 days . Long-term BP goal normotensive  Hyperlipidemia  Home meds:  No statin  LDL 78, goal < 70  Add statin (lipitor 40) at discharge based on plan of care   Hypoglycemia  HgbA1c 6.1  Dysphagia . NPO   Other Stroke Risk Factors  Hx ETOH use  Other Active Problems  NF2  Acute metabolic encephalopathy   Hyponatremia, anasarca  R pleural effusions  Seizures on keppra PTA. EEG neg [facility] day # 8 Patient has bilateral medial frontal infarcts likely due to involvement of anterior cerebral arteries due to his extensive meningiomatosis from his neurofibromatosis.  Atrial fibrillation was transiently documented during intubation and could also have contributed but patient is not a good long-term anticoagulation candidate given extensive vascular brain tumors.  Continue aspirin. .  Continue Keppra for seizures.  Patient's prognosis is quite poor given baseline quadriplegia and now comatose state.  Patient is  likely going to survive with very poor and limited quality of life requiring tracheostomy, PEG tube and prolonged ventilatory support and 24-hour nursing care.  I explained this to the patient's sister at the bedside and answered questions... Discussed with patient sister as well as palliative care nurse practitioner and answered questions. Discussed with Dr. Tamala Julian and answered questions. Family still struggling with decision about goals of care and hopefully will make decision soon This patient is critically ill and at significant risk of neurological worsening, death and care requires constant monitoring of vital signs, hemodynamics,respiratory and cardiac monitoring, extensive review of multiple databases, frequent neurological assessment, discussion with family, other specialists and medical decision making of high complexity.I have made any additions or clarifications directly to the above note.This critical care time does not reflect procedure time, or teaching time or supervisory time of PA/NP/Med Resident etc but could involve care discussion time.  I spent 32 minutes of neurocritical care time  in the care of  this patient.    Antony Contras, MD  To contact Stroke Continuity provider, please refer to [email or web address] After hours, contact General Neurology

## 2020-08-16 NOTE — Progress Notes (Signed)
NAME:  Cameron Hale, [name]:  [id number], DOB:  25-Jun-1976, LOS: 8 ADMISSION DATE:  08/08/2020, CONSULTATION DATE:  08/12/20 REFERRING MD:  Opyd, CHIEF COMPLAINT:  unresponsive   Brief History   44 year old man with quadriplegia, seizure disorder presenting with suspected recurrent seizures, hypoxemia, and hypotension.  History of present illness   44 year old man with hx of type 2 neurofibromatosis, quadriplegia, blindness, hearing loss (baseline only able to talk) presenting with recurrent unresponsive episodes as well as generalized seizure activity.  Brought to [facility] for further management.  Unfortunately less responsive this AM with hypotension and hypoxemia so PCCM consulted.  Patient unable to provide any history.  Normally followed by [facility] but due to instability was brought into [place].  Past Medical History  -Neurofibromatosis 2 -Neurosurgical intervention March 2021 with postoperative course complicated by functional quadriplegia, blindness, hearing loss (baseline only able to talk)  Decubitus pressue ulcer  Significant Hospital Events   9/21 admitted to [facility] 9/25 ETT, PICC, arterial line, right chest tube  Consults:  Neurology  Procedures:   9/24 R PICC >> 9/25 ETT >> 9/25 L radial aline >> 9/25 R pigtail CT >>  9/21 EEG no seizures 9/26 cEEG d/c- > no seizures  Significant Diagnostic Tests:  CT Brain 9/21 IMPRESSION: No evidence of acute intracranial hemorrhage or acute demarcated cortical infarct. Extensive meningiomatosis more fully characterized on the recent prior brain MRI of 05/24/2020. Most notably, this includes a large parafalcine meningioma as well as extensive en plaque meningiomas greatest along the anterior and superior cerebral convexities. Unchanged prominent vasogenic edema within the underlying mid-to-anterior frontal lobes and crossing the corpus callosum. As before, there is herniation of the rectus gyri into the  sella turcica. 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 A known right vestibular schwannoma was better appreciated on the prior MRI. Questionable asymmetric enlargement of the right hippocampus was also better appreciated on the prior MRI.  9/26 CTH >> 1. Motion degraded exam. 2. Question an approximate 1.3 cm area of increased hypodensity involving the posterior limb of the right internal capsule. While this finding may be artifactual in nature given motion artifact on this exam, a possible evolving ischemic infarct is difficult to exclude, and could be considered in the correct clinical setting. 3. No other new acute intracranial abnormality. 4. Underlying extensive changes related to NF 2 with meningiomatosis and associated bifrontal edema, stable.  9/26 MR brain >> Cortically based acute infarcts involving the bilateral frontal lobes most prominent in the ACA territory.  Small superior vermian acute infarct.  Chronic sequela of NF 2, grossly unchanged. No new or enlarging intracranial lesions however lack of intravenous contrast limits evaluation.  Dominant parafalcine meningioma and bifrontal edema, grossly unchanged in size.  9/27 CTH 1. No significant interval change of multifocal evolving ischemic infarcts, better evaluated on recent brain MRI. No hemorrhagic transformation or other complication. 2. Extensive underlying changes related to NF2, stable. No new intracranial abnormality.  9/27 CTA head/ neck >> 1. Negative CTA for large vessel occlusion. No hemodynamically significant or correctable stenosis. The intracranial circulation is diffusely diminutive but remains patent. 2. Multifocal opacities throughout the visualized lungs, concerning for multifocal infection/pneumonia. 3. Large layering left pleural effusion, partially  visualized.  Micro Data:  9/21 COVID neg 9/21 UC neg 9/21 BCx 2 >> neg 9/24 MRSA neg 9/25 R pleural fluid cx >> ngtd 9/25 R pleural  fluid g/s >> neg HIV neg 9/26 resp: ngtd  Antimicrobials:  9/21 vanc >>9/24 9/21 aztreonam  9/21 flagyl >>9/25 9/21 cefepime >>9/25 9/26 ceftriaxone >>  Interim history/subjective:  9/29: no acute events overnight. Remains unresponsive on vent. Attempted on pst but no patient effort. Noted twitching of eyes and jaw/lips.   Objective   Blood pressure 112/66, pulse 63, temperature 98.1 F (36.7 C), resp. rate 16, height 5\' 10"  (1.778 m), weight 72.4 kg, SpO2 100 %.    Vent Mode: PRVC FiO2 (%):  [40 %] 40 % Set Rate:  [16 bmp] 16 bmp Vt Set:  [580 mL] 580 mL PEEP:  [5 cmH20] 5 cmH20 Plateau Pressure:  [14 cmH20-15 cmH20] 14 cmH20   Intake/Output Summary (Last 24 hours) at 08/16/2020 3354 Last data filed at 08/16/2020 0800 Gross per 24 hour  Intake 4352.92 ml  Output 1972 ml  Net 2380.92 ml   Filed Weights   08/08/20 1430 08/13/20 0500 08/14/20 0500  Weight: 65.3 kg 72.1 kg 72.4 kg    Examination: Constitutional: chronically ill man in no acute distress Eyes: eyes are anicteric, reactive to light Ears, nose, mouth, and throat: mucous membranes moist, ETT in place Cardiovascular: heart sounds are regular, ext are warm to touch. ++ edema, +muscle wasting and contractures Respiratory: diminished bases, R chest tube in place with serous output Gastrointestinal: abdomen is soft with + BS Skin: No rashes, normal turgor Neurologic: he has rhythmic movement of jaw, eyes open and staring to right, no eye response to pain, visual stimuli, auditory stimuli, quadriplegic    No acute infiltrate noted on cxr, personally reviewed Lab Results  Component Value Date   CREATININE 0.63 08/16/2020   BUN 32 (H) 08/16/2020   NA 140 08/16/2020   K 3.1 (L) 08/16/2020   CL 109 08/16/2020   CO2 19 (L) 08/16/2020   Lab Results  Component Value Date    WBC 10.0 08/16/2020   HGB 8.4 (L) 08/16/2020   HCT 25.6 (L) 08/16/2020   MCV 78.5 (L) 08/16/2020   PLT 121 (L) 08/16/2020    Resolved Hospital Problem list   N/A  Assessment & Plan:  Acute metabolic encephalopathy, Persistent hypoglycemia, new ischemic infarcts 9/26- Baseline able to speak but blind and hard of hearing, functionally quadriplegic.  Seizures ruled out by repeated EEGs.  Possible component of cefepime related encephalopathy- cefepime stopped 9/25.  Bilateral ACA strokes likely due to mass effect of patient's meningiomatosis.  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   - Appreciate neurology input, per note: " Patient is likely going to survive with very poor and limited quality of life requiring tracheostomy, PEG tube and prolonged ventilatory support and 24-hour nursing care.  I explained this to the patient's mother who is not willing to give up on him and wants to hang on to him as long as possible." - Continue to minimize sedation  Acute hypoxemic respiratory failure- Patient was on brigatinib as off-label trial which has 5% incidence of pneumonitis, but given his encephalopathy and imaging findings he is more aspiration pneumonitis and third spacing due to poor baseline nutritional status.  Another possibility is progressive neurofibromas affecting secretion clearance and diaphragm strength. - Vent support, VAP prevention bundle - s/p completion of 7 day course of abx  - weaning PEEP/ FiO2 today and hopeful to start PSV trials - continue methylprednisolone to treat any contribution of the brigatinib - goal even to negative balance  - cxr without acute infiltrates, personally reviewed  by me - Depending on family decisions, may need tracheostomy and LTACH  Hypernatremia, anasarca - resolved -decrease free water.  - 7L positive over hospital stay - add scheduled lasix today.  Hypokalemia:  Hypomag:  -replace, recheck bmp around 1500 today in order to cont  replacement with ongoing diuresis.   Hypoglycemia -resolved  Labile blood pressures- suspect autonomic instability related to heavy NF2 burden on spinal cord. - continue midodrine - map goal > 65  R pleural effusion- probably benign transudate from poor oncotic pressure but is neutrophil predominant, regardless seems to have drained well - had 150 out yesterday will cont until less output for now.   NF2- progressive, treatment currently on hold  Microcytic anemia:  -slow downward trend -no acute indication for transfusion  Prediabetic:  -a1c 6.1 -Glucose monitoring  Best practice:  Diet: NPO, TF per RD  Pain/Anxiety/Delirium protocol (if indicated): prn fentanyl  VAP protocol (if indicated): per protocol DVT prophylaxis: lovenox GI prophylaxis: PPI Glucose control: q4h checks Mobility: BR Code Status: full Family Communication: pending Disposition: ICU     Critical care time: The patient is critically ill with multiple organ systems failure and requires high complexity decision making for assessment and support, frequent evaluation and titration of therapies, application of advanced monitoring technologies and extensive interpretation of multiple databases.  Critical care time 38 mins. This represents my time independent of the NPs time taking care of the pt. This is excluding procedures.    [facility] 08/16/2020, 9:26 AM

## 2020-08-16 NOTE — Progress Notes (Signed)
K 3.1, Mg 1.9 Electrolytes replaced per protocol

## 2020-08-16 NOTE — Evaluation (Signed)
Occupational Therapy Evaluation/Discharge Patient Details Name: Cameron Hale [name]: [id number] DOB: [birth year] Today's Date: 08/16/2020    History of Present Illness 44 yo admitted 9/21 with unresponsiveness and seizures. Intubated 9/25. MRI with bilcerebral frontal infarcts. PMhx: neurofibromatosis with neurosurgical intervention march 2021 with resultant quadriplegia, seizure disorder,left eye blindness, hearing loss   Clinical Impression   Pt with longstanding dependence in all mobility and ADL. Pt with lethargy. Noted slight response to noxious stimuli on R hand and blinked to threat. No volitional interaction noted. Recommend ROM and bed positioning with nursing staff. No acute OT needs.     Follow Up Recommendations  No OT follow up;Supervision/Assistance - 24 hour (LTACH vs SNF)    Equipment Recommendations  None recommended by OT    Recommendations for Other Services       Precautions / Restrictions Precautions Precautions: Other (comment) Precaution Comments: vent, ETT, quadriplegia Restrictions Weight Bearing Restrictions: Yes RLE Weight Bearing: Non weight bearing LLE Weight Bearing: Non weight bearing      Mobility Bed Mobility               General bed mobility comments: deferred secondary to lack of interaction/command following  Transfers                      Balance                                           ADL either performed or assessed with clinical judgement   ADL                                         General ADL Comments: total assist     Vision Baseline Vision/History: Legally blind (L eye) Additional Comments: dysconjugate gaze, reponded to threat with eye blinking     Perception     Praxis      Pertinent Vitals/Pain Pain Assessment: Faces Faces Pain Scale: Hurts little more Pain Location: slight withdrawal and apparent awareness of noxious stimuli to RUE only Pain  Intervention(s): Repositioned     Hand Dominance     Extremity/Trunk Assessment Upper Extremity Assessment Upper Extremity Assessment: RUE deficits/detail;LUE deficits/detail RUE Deficits / Details: full PROM, flat hand arches, joint changes in digits 3-5, no active movement RUE Coordination: decreased fine motor;decreased gross motor LUE Deficits / Details: tightness in shoulder, but able to achieve full PROM, flat hand arches, no active movement LUE Coordination: decreased fine motor;decreased gross motor   Lower Extremity Assessment Lower Extremity Assessment: RLE deficits/detail;LLE deficits/detail RLE Deficits / Details: no AROM or withdrawal to pain. PROM WFL LLE Deficits / Details: no AROM or withdrawal to pain. PROM WFL   Cervical / Trunk Assessment Cervical / Trunk Assessment: Other exceptions Cervical / Trunk Exceptions: head rotated to R, tightness with PROM   Communication Communication Communication: HOH (per chart)   Cognition Arousal/Alertness: Lethargic Behavior During Therapy: Flat affect                                   General Comments: pt lethargic with very limited eye opening and even with assisted eye opening upward dysconjugate gaze. pt with limited blink to threat, no tracking. no command following  of any extremity or head/neck/face movement   General Comments       Exercises     Shoulder Instructions      Home Living Family/patient expects to be discharged to:: Private residence Living Arrangements: Parent;Other relatives Available Help at Discharge: Family;Friend(s);Available 24 hours/day Type of Home: House Home Access: Ramped entrance     Home Layout: One level     Bathroom Shower/Tub: Teacher, early years/pre: Standard     Home Equipment: Hospital bed;Other (comment);Wheelchair - power   Additional Comments: information gathered from previous chart entry      Prior Functioning/Environment Level of  Independence: Needs assistance  Gait / Transfers Assistance Needed: max-total assist for bed mobility, hoyer lift to power WC ADL's / Homemaking Assistance Needed: dependent in ADL   Comments: no family present, pt intubated        OT Problem List:        OT Treatment/Interventions:      OT Goals(Current goals can be found in the care plan section)    OT Frequency:     Barriers to D/C:            Co-evaluation   Reason for Co-Treatment: Complexity of the patient's impairments (multi-system involvement) PT goals addressed during session: Mobility/safety with mobility        AM-PAC OT "6 Clicks" Daily Activity     Outcome Measure Help from another person eating meals?: Total Help from another person taking care of personal grooming?: Total Help from another person toileting, which includes using toliet, bedpan, or urinal?: Total Help from another person bathing (including washing, rinsing, drying)?: Total Help from another person to put on and taking off regular upper body clothing?: Total Help from another person to put on and taking off regular lower body clothing?: Total 6 Click Score: 6   End of Session    Activity Tolerance: Patient limited by lethargy Patient left: in bed;with call bell/phone within reach  OT Visit Diagnosis: Muscle weakness (generalized) (M62.81)                Time: 1937-9024 OT Time Calculation (min): 15 min Charges:  OT General Charges $OT Visit: 1 Visit OT Evaluation $OT Eval High Complexity: 1 High  Nestor Lewandowsky, OTR/L Acute Rehabilitation Services Pager: [phone] Office: [phone]  Malka So 08/16/2020, 11:41 AM

## 2020-08-16 NOTE — Progress Notes (Signed)
Patient ID: Cameron Hale, male   DOB: 1976-04-10, 44 y.o.   [name]: [id number]  This NP Cameron Hale reviewed medical records, received report from team, assessed the patient and then meet at the patient's bedside,  as schduled with wife and mother to discuss diagnosis, prognosis, GOC, EOL wishes disposition and options.  I meet mother/Cameron Hale in person yesterday and spoke to wife/Cameron Hale by telephone.  Today I meet with both in person.   Concept of Palliative Care was introduced as specialized medical care for people and their families living with serious illness.  If focuses on providing relief from the symptoms and stress of a serious illness.  The goal is to improve quality of life for both the patient and the family.  Created space and opportunity for family to explore their thoughts and feelings regarding "Cameron Hale's" current medical situation.  Values and goals of care important to patient and family were attempted to be elicited.  Important to know that prior to March, 2021 he was living at home with his wife, ambulating with a walker but able to drive. Unfortunately  post surgical intervetnion at [facility] meningioma neurofibroma resection, and a stay at [facility] rehabilitation he was discharged home under the care of his mother.  He remains quadriplegic, neurogenetic bowel and bladder,  bed bound, total care, communicates but voice is weak in projection.    Wife remains the main decision maker for this patient, she works closely with his family for all important decisions. She can be reached on her cell phone or text # [phone].     She also leaves her work contact at [employer]  # [phone].      Cameron Hale is his neurologist, he was on the call this morning.   # [phone]   A  discussion was had today regarding advanced directives.  Concepts specific to code status, artifical feeding and hydration, continued IV antibiotics and rehospitalization was had.    The difference  between a aggressive medical intervention path  and a palliative comfort care path for this patient at this time was had.  Family is faced with making decisions regarding continued aggressive interventions to prolong life; trach, PEG.    A detailed discussion regarding the  likely trajectory for this patient if he remains vent dependant was discussed.  Education offered regarding human mortality and adult failure to thrive along with the limitations of medcical interventions to prolong quality of life when a body fails to thrive.   We discussed high risk for decompensation, infection, skin breakdown and aspiration.     Values and goals of care important to patient and family were attempted to be elicited.   MOST form introduced, not completed.   It is very important for family and patient to have further  conversation with each other and the medical team  before moving forward with trach and PEG   Discussed with family the importance of continued conversation with each other  and their  medical providers regarding overall plan of care and treatment options,  ensuring decisions are within the context of the patients values and GOCs.  Questions and concerns addressed   Discussed with Cameron Hale   Total time spent on the unit was 80 minutes  Greater than 50% of the time was spent in counseling and coordination of care  Cameron Lessen NP  Palliative Medicine Team Team Phone # [phone] Pager [phone]

## 2020-08-16 NOTE — TOC Initial Note (Signed)
Transition of Care [facility]) - Initial/Assessment Note    Patient Details  Name: Cameron Hale [name]: [id number] Date of Birth: 03/14/1976  [facility]) CM/SW Contact:    Curlene Labrum, RN Phone Number: 08/16/2020, 3:46 PM  Clinical Narrative:                 Case management consulted and will continue to monitor the patient and speak with the family regarding transitions of care.  The patient's family is planned to have goals of care meeting with the medical provider today and scheduled for tracheostomy on Monday.  I will continue to follow the patient for needs.  Patient is intubated and on the ventilator today.  I spoke with [place] at [facility] and [facility] to discuss possible admission options for the patient once the patient is stable for LTAC admission.  [facility] not able to offer the patient a bed at this point.  [facility], [place], [place] will continue to follow the patient and once the patient is medically stable and has family meeting  - plan to meet the family to offer possible bed a [facility] for needed LTAC or SNF admission.  Expected Discharge Plan: Long Term Acute Care (LTAC) Barriers to Discharge: Continued Medical Work up   Patient Goals and CMS Choice        Expected Discharge Plan and Services Expected Discharge Plan: [place] (LTAC)       Living arrangements for the past 2 months: Single Family Home                                      Prior Living Arrangements/Services Living arrangements for the past 2 months: Single Family Home   Patient language and need for interpreter reviewed:: Yes        Need for Family Participation in Patient Care: Yes (Comment) Care giver support system in place?: Yes (comment)   Criminal Activity/Legal Involvement Pertinent to Current Situation/Hospitalization: No - Comment as needed  Activities of Daily Living Home Assistive Devices/Equipment: Wheelchair ADL  Screening (condition at time of admission) Patient's cognitive ability adequate to safely complete daily activities?: Yes Is the patient deaf or have difficulty hearing?: Yes Does the patient have difficulty seeing, even when wearing glasses/contacts?: Yes Does the patient have difficulty concentrating, remembering, or making decisions?: No Patient able to express need for assistance with ADLs?: No Does the patient have difficulty dressing or bathing?: Yes Independently performs ADLs?: No Communication: Independent Dressing (OT): Dependent Is this a change from baseline?: Pre-admission baseline Grooming: Dependent Is this a change from baseline?: Pre-admission baseline Feeding: Dependent Is this a change from baseline?: Pre-admission baseline Bathing: Dependent Is this a change from baseline?: Pre-admission baseline Toileting: Dependent Is this a change from baseline?: Pre-admission baseline In/Out Bed: Dependent (Paralysis) Is this a change from baseline?: Pre-admission baseline Walks in Home: Dependent (Paralysis) Is this a change from baseline?: Pre-admission baseline Does the patient have difficulty walking or climbing stairs?: Yes (Paralysis) Weakness of Legs: Both (paralysis) Weakness of Arms/Hands: Both (paralysis)  Permission Sought/Granted Permission sought to share information with : Case Manager Permission granted to share information with : Yes, Verbal Permission Granted        Permission granted to share info w Relationship: family     Emotional Assessment Appearance:: Appears stated age       Alcohol / Substance  Use: Not Applicable Psych Involvement: No (comment)  Admission diagnosis:  Seizures ([place]) [R56.9] Transaminasemia [R74.01] AMS (altered mental status) [R41.82] Patient Active Problem List   Diagnosis Date Noted  . DNR (do not resuscitate) discussion   . History of ETT   . Palliative care by specialist   . Cerebral thrombosis with cerebral  infarction 08/14/2020  . Acute respiratory failure ([place]) 08/09/2020  . Transaminasemia   . AMS (altered mental status) 08/08/2020  . Aspiration pneumonia ([place]) 08/08/2020  . Neurofibromatosis 2 ([place]) 08/08/2020  . Acute blood loss anemia   . Hypokalemia   . Hyponatremia   . Seizures ([place])   . Neurogenic bowel   . Incomplete paraplegia ([place]) 05/09/2020  . Quadriplegia ([place]) 05/09/2020  . Sensorineural hearing loss (SNHL) of right ear with unrestricted hearing of left ear 06/03/2019  . Tinnitus of right ear 06/03/2019  . Mixed conductive and sensorineural hearing loss of right ear with unrestricted hearing of left ear 05/04/2019  . Keratoma 05/01/2016  . Difficulty walking 05/01/2016  . Neuromyopathy ([place]) 05/01/2016  . 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 Acoustic neuroma ([place]) 03/14/2015  . Atypical intracranial meningioma ([place]) 03/14/2015  . H/O arthrodesis 02/17/2014  . Paraparesis ([place]) 02/17/2014  . Kidney lump 07/03/2012  . Neurofibroma of multiple sites ([place]) 09/23/2011   PCP:  Alroy Dust, L.Marlou Sa, MD Pharmacy:   [id number] - [place], [place] - [address] [place] [address] Phone: [phone] Fax: [phone]  [facility] [place], [place] - [address] [place] [address] Phone: [phone] Fax: [phone]  [id number] - [place], [address] [place] [address] Phone: [phone] Fax: [phone]     Social Determinants of Health (SDOH) Interventions    Readmission Risk Interventions No flowsheet data found.

## 2020-08-16 NOTE — Progress Notes (Signed)
RT placed patient on CPAP/PSV 5/5. Patient went apneic and was placed back on full support. RT will continue to monitor.

## 2020-08-17 ENCOUNTER — Inpatient Hospital Stay (HOSPITAL_COMMUNITY): Payer: Medicare Other

## 2020-08-17 ENCOUNTER — Ambulatory Visit: Payer: Medicare Other | Admitting: Urology

## 2020-08-17 ENCOUNTER — Encounter (HOSPITAL_BASED_OUTPATIENT_CLINIC_OR_DEPARTMENT_OTHER): Payer: Medicare Other | Admitting: Internal Medicine

## 2020-08-17 DIAGNOSIS — R0603 Acute respiratory distress: Secondary | ICD-10-CM

## 2020-08-17 DIAGNOSIS — J9620 Acute and chronic respiratory failure, unspecified whether with hypoxia or hypercapnia: Secondary | ICD-10-CM

## 2020-08-17 LAB — CBC
HCT: 26.4 % — ABNORMAL LOW (ref 39.0–52.0)
Hemoglobin: 8.8 g/dL — ABNORMAL LOW (ref 13.0–17.0)
MCH: 25.4 pg — ABNORMAL LOW (ref 26.0–34.0)
MCHC: 33.3 g/dL (ref 30.0–36.0)
MCV: 76.1 fL — ABNORMAL LOW (ref 80.0–100.0)
Platelets: 158 10*3/uL (ref 150–400)
RBC: 3.47 MIL/uL — ABNORMAL LOW (ref 4.22–5.81)
RDW: 17.4 % — ABNORMAL HIGH (ref 11.5–15.5)
WBC: 10.4 10*3/uL (ref 4.0–10.5)
nRBC: 0 % (ref 0.0–0.2)

## 2020-08-17 LAB — BASIC METABOLIC PANEL
Anion gap: 9 (ref 5–15)
BUN: 35 mg/dL — ABNORMAL HIGH (ref 6–20)
CO2: 24 mmol/L (ref 22–32)
Calcium: 7.9 mg/dL — ABNORMAL LOW (ref 8.9–10.3)
Chloride: 106 mmol/L (ref 98–111)
Creatinine, Ser: 0.49 mg/dL — ABNORMAL LOW (ref 0.61–1.24)
GFR calc Af Amer: >60 mL/min (ref >60)
GFR calc non Af Amer: >60 mL/min (ref >60)
Glucose, Bld: 154 mg/dL — ABNORMAL HIGH (ref 70–99)
Potassium: 3.6 mmol/L (ref 3.5–5.1)
Sodium: 139 mmol/L (ref 135–145)

## 2020-08-17 LAB — CULTURE, BODY FLUID W GRAM STAIN -BOTTLE: Culture: NO GROWTH

## 2020-08-17 LAB — MAGNESIUM: Magnesium: 2.2 mg/dL (ref 1.7–2.4)

## 2020-08-17 LAB — GLUCOSE, CAPILLARY
Glucose-Capillary: 126 mg/dL — ABNORMAL HIGH (ref 70–99)
Glucose-Capillary: 132 mg/dL — ABNORMAL HIGH (ref 70–99)
Glucose-Capillary: 138 mg/dL — ABNORMAL HIGH (ref 70–99)
Glucose-Capillary: 138 mg/dL — ABNORMAL HIGH (ref 70–99)
Glucose-Capillary: 140 mg/dL — ABNORMAL HIGH (ref 70–99)
Glucose-Capillary: 159 mg/dL — ABNORMAL HIGH (ref 70–99)
Glucose-Capillary: 161 mg/dL — ABNORMAL HIGH (ref 70–99)

## 2020-08-17 LAB — PHOSPHORUS: Phosphorus: 2.2 mg/dL — ABNORMAL LOW (ref 2.5–4.6)

## 2020-08-17 MED ORDER — MIDODRINE HCL 5 MG PO TABS
2.5000 mg | ORAL_TABLET | Freq: Three times a day (TID) | ORAL | Status: DC
  Administered 2020-08-17 – 2020-08-18 (×4): 2.5 mg
  Filled 2020-08-17 (×4): qty 1

## 2020-08-17 MED ORDER — POTASSIUM CHLORIDE 20 MEQ/15ML (10%) PO SOLN
40.0000 meq | Freq: Once | ORAL | Status: AC
  Administered 2020-08-17: 40 meq
  Filled 2020-08-17: qty 30

## 2020-08-17 MED ORDER — METHYLPREDNISOLONE SODIUM SUCC 125 MG IJ SOLR
50.0000 mg | Freq: Two times a day (BID) | INTRAMUSCULAR | Status: DC
  Administered 2020-08-17 – 2020-08-24 (×15): 50 mg via INTRAVENOUS
  Filled 2020-08-17 (×15): qty 2

## 2020-08-17 MED ORDER — FREE WATER
100.0000 mL | Freq: Four times a day (QID) | Status: DC
  Administered 2020-08-17 – 2020-08-21 (×16): 100 mL

## 2020-08-17 MED ORDER — FUROSEMIDE 10 MG/ML IJ SOLN
20.0000 mg | Freq: Two times a day (BID) | INTRAMUSCULAR | Status: DC
  Administered 2020-08-17 – 2020-08-20 (×5): 20 mg via INTRAVENOUS
  Filled 2020-08-17 (×6): qty 2

## 2020-08-17 NOTE — Progress Notes (Signed)
Patient ID: Cameron Hale, male   DOB: 07/28/1976, 44 y.o.   [name]: [id number]  This NP Wadie Lessen reviewed medical records,  assessed patient and then spoke to Dr Ruthann Cancer regarding her earlier assessment of the patient and communication with the patient regarding his current medical situation and wishes for himself.  ( see CCM note from today)  Unfortunately when I attempted to communicate with Cameron Hale later in the day he was unable to  communicate appropriate responses to my questions.   He appeared tired  I meet mother/Cameron Hale, she is anxious, she tells me that earlier today her son responded to her and her questions.  Her questions were "do you want to get better?", do you want to come home with me?", do you want to go to the beach?",  He nodded yes to all of those questions.  I then spoke by telephone to wife/Cameron Hale.  I shared with her Dr. Marcheta Grammes experience with her husband and Dr. Marcheta Grammes belief that the patient was very much able to communicate advanced directive decisions specific to trach, long-term vent dependence even and the possibility of death.  Wife verbalized her concern that she does not believe her husband has the capacity to make there is decisions at this time and she also verbalized her concern that the patient's mother is unable to receive and interpret the medical information that she receives.  "She only wants to see the good stuff"  Wife verbalizes that she knows her husband would never want to live long-term on machines, be a burden or live long-term in a nursing facility.  However at this time she is not in a place of making any decision regarding de-escalation of care.  Wife remains the main decision maker for this patient, however she works closely with his family for all important decisions. She can be reached on her cell phone or can be  texted at  # [phone].      She also leaves her work contact at [employer]  # [phone], where she can be  reached Monday through Friday during working hours.   Patient and family face decisions regarding advanced directive decisions,  treatment option decsions,  (Trach/PEG),  and anticipatory care needs.  If patient is able to communicate his wishes and has capacity , I stressed with both the patient's mother and father and care team the importance of all being present at the bedside when this communication takes place in order to  avoid any uncertainty.    It is very important for family and patient to have further  conversation with each other and the medical team  before moving forward with trach and PEG   Questions and concerns addressed   Discussed with Dr Ruthann Cancer via secure chat and Blinda Leatherwood / nursing director   This nurse practitioner informed  the family and the attending that I will be out of the hospital next week.     Another PMT provider will be following this case and will be available for questions and concerns.  Call palliative medicine team phone # [phone]   Total time spent on the unit was  Minutes 60 minutes  Greater than 50% of the time was spent in counseling and coordination of care  Wadie Lessen NP  Palliative Medicine Team Team Phone # [phone] Pager [phone]

## 2020-08-17 NOTE — Progress Notes (Signed)
NAME:  Cameron Hale, [name]:  [id number], DOB:  [birth year], LOS: 9 ADMISSION DATE:  08/08/2020, CONSULTATION DATE:  08/12/20 REFERRING MD:  Opyd, CHIEF COMPLAINT:  unresponsive   Brief History   44 year old man with quadriplegia, seizure disorder presenting with suspected recurrent seizures, hypoxemia, and hypotension.  History of present illness   44 year old man with hx of type 2 neurofibromatosis, quadriplegia, blindness, hearing loss (baseline only able to talk) presenting with recurrent unresponsive episodes as well as generalized seizure activity.  Brought to [facility] for further management.  Unfortunately less responsive this AM with hypotension and hypoxemia so PCCM consulted.  Patient unable to provide any history.  Normally followed by [facility] but due to instability was brought into [place].  Past Medical History  -Neurofibromatosis 2 -Neurosurgical intervention March 2021 with postoperative course complicated by functional quadriplegia, blindness, hearing loss (baseline only able to talk)  Decubitus pressue ulcer  Significant Hospital Events   9/21 admitted to [facility] 9/25 ETT, PICC, arterial line, right chest tube  Consults:  Neurology  Procedures:   9/24 R PICC >> 9/25 ETT >> 9/25 L radial aline >> 9/25 R pigtail CT >>  9/21 EEG no seizures 9/26 cEEG d/c- > no seizures  Significant Diagnostic Tests:  CT Brain 9/21 IMPRESSION: No evidence of acute intracranial hemorrhage or acute demarcated cortical infarct. Extensive meningiomatosis more fully characterized on the recent prior brain MRI of 05/24/2020. Most notably, this includes a large parafalcine meningioma as well as extensive en plaque meningiomas greatest along the anterior and superior cerebral convexities. Unchanged prominent vasogenic edema within the underlying mid-to-anterior frontal lobes and crossing the corpus callosum. As before, there is herniation of the rectus gyri into the  sella turcica. 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 A known right vestibular schwannoma was better appreciated on the prior MRI. Questionable asymmetric enlargement of the right hippocampus was also better appreciated on the prior MRI.  9/26 CTH >> 1. Motion degraded exam. 2. Question an approximate 1.3 cm area of increased hypodensity involving the posterior limb of the right internal capsule. While this finding may be artifactual in nature given motion artifact on this exam, a possible evolving ischemic infarct is difficult to exclude, and could be considered in the correct clinical setting. 3. No other new acute intracranial abnormality. 4. Underlying extensive changes related to NF 2 with meningiomatosis and associated bifrontal edema, stable.  9/26 MR brain >> Cortically based acute infarcts involving the bilateral frontal lobes most prominent in the ACA territory.  Small superior vermian acute infarct.  Chronic sequela of NF 2, grossly unchanged. No new or enlarging intracranial lesions however lack of intravenous contrast limits evaluation.  Dominant parafalcine meningioma and bifrontal edema, grossly unchanged in size.  9/27 CTH 1. No significant interval change of multifocal evolving ischemic infarcts, better evaluated on recent brain MRI. No hemorrhagic transformation or other complication. 2. Extensive underlying changes related to NF2, stable. No new intracranial abnormality.  9/27 CTA head/ neck >> 1. Negative CTA for large vessel occlusion. No hemodynamically significant or correctable stenosis. The intracranial circulation is diffusely diminutive but remains patent. 2. Multifocal opacities throughout the visualized lungs, concerning for multifocal infection/pneumonia. 3. Large layering left pleural effusion, partially  visualized.  Micro Data:  9/21 COVID neg 9/21 UC neg 9/21 BCx 2 >> neg 9/24 MRSA neg 9/25 R pleural fluid cx >> ngtd 9/25 R pleural  fluid g/s >> neg HIV neg 9/26 resp: ngtd  Antimicrobials:  9/21 vanc >>9/24 9/21 aztreonam  9/21 flagyl >>9/25 9/21 cefepime >>9/25 9/26 ceftriaxone >>9/29  Interim history/subjective:  9/30: no acute events overnight. Awake and following commands. Pt clearly responded he would not want trach. he acknowledged the possibility of death. He also acknowledged that he would want to be made comfortable should he not be able to sruvive without a trach. I approached this with him in numerous ways and he was consistent in his answers that he would not want a trach and would favor comfort care. I attempted to reach the patients wife without answer. So I attempted to relay this information to the pt's mother (listed as next contact) she was unwilling to hear this information. She stated that she wanted to speak with his primary neurologist first and the pt's critical care Dr. I attempted to relay that we were able to communicate with Dr Clovis Riley yesterday and that I am his critical care doctor and Dr Loletta Specter will be taking over tomorrow thru most of next week. She stated that "Dr Tamala Julian told me he was his critical care doctor for this hospitalization and so we will wait until he returns, whenever that is.".  I attempted to provide reassurance that I am continuing aggressive care and attempts at liberation, my only reason for call was to provide update and information from the conversation with pt.  I did also relay that it is the neurologist who is able to prognosticate long term for this disease process and his quadraplegia state and not [place] and she should confer with them. I did state that with his quadraplegia, lengthy hospitalization, recent encephalopathy that if we did provide trach for him I suspect it would be chronic and while could be downsized not likely removed  completely unless pt made miraculous recovery from a functional/strength standpoint. I am unsure of her understanding or absorption of this information as she continued to perseverate on Dr Thompson Caul presence and potential transfer to [facility] for Dr Clovis Riley to see him without acknowledgement of our conversation. Per review of care everywhere pt's disease process ha sbeen progressive despite therapies on imaging from 01/2020 to 02/2020 and even more so to 04/2020... causing mass effect and increased edema.   cxr with some improvement in infiltrates, personally reviewed by me. Ct output decreased will d/c tube today.   9/29: no acute events overnight. Remains unresponsive on vent. Attempted on pst but no patient effort. Noted twitching of eyes and jaw/lips.   Objective   Blood pressure 121/79, pulse 73, temperature 98.1 F (36.7 C), resp. rate 16, height 5\' 10"  (1.778 m), weight 72.4 kg, SpO2 100 %.    Vent Mode: PRVC FiO2 (%):  [40 %] 40 % Set Rate:  [16 bmp] 16 bmp Vt Set:  [580 mL] 580 mL PEEP:  [5 cmH20] 5 cmH20 Plateau Pressure:  [13 cmH20-14 cmH20] 14 cmH20   Intake/Output Summary (Last 24 hours) at 08/17/2020 0810 Last data filed at 08/17/2020 0759 Gross per 24 hour  Intake 1424.06 ml  Output 6495 ml  Net -5070.94 ml   Filed Weights   08/08/20 1430 08/13/20 0500 08/14/20 0500  Weight: 65.3 kg 72.1 kg 72.4 kg    Examination: Constitutional: chronically ill man in no acute distress Eyes: eyes are anicteric, reactive to light Ears, nose, mouth, and throat: mucous membranes moist, ETT in place Cardiovascular: heart sounds are regular, ext are warm to touch. ++ edema, +muscle wasting and  contractures Respiratory: diminished bases, R chest tube in place with serous output Gastrointestinal: abdomen is soft with + BS Skin: No rashes, normal turgor Neurologic: he has rhythmic movement of jaw, eyes open and staring to right, no eye response to pain, visual stimuli, auditory stimuli,  quadriplegic    No acute infiltrate noted on cxr, personally reviewed Lab Results  Component Value Date   CREATININE 0.49 (L) 08/17/2020   BUN 35 (H) 08/17/2020   NA 139 08/17/2020   K 3.6 08/17/2020   CL 106 08/17/2020   CO2 24 08/17/2020   Lab Results  Component Value Date   WBC 10.4 08/17/2020   HGB 8.8 (L) 08/17/2020   HCT 26.4 (L) 08/17/2020   MCV 76.1 (L) 08/17/2020   PLT 158 08/17/2020    Resolved Hospital Problem list   N/A  Assessment & Plan:  Acute metabolic encephalopathy, Persistent hypoglycemia, new ischemic infarcts 9/26- Baseline able to speak but blind and hard of hearing, functionally quadriplegic.  Seizures ruled out by repeated EEGs.  Possible component of cefepime related encephalopathy- cefepime stopped 9/25.  Bilateral ACA strokes likely due to mass effect of patient's meningiomatosis.  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   - Appreciate neurology input, per note: " Patient is likely going to survive with very poor and limited quality of life requiring tracheostomy, PEG tube and prolonged ventilatory support and 24-hour nursing care.  I explained this to the patient's mother who is not willing to give up on him and wants to hang on to him as long as possible." -see above documentation.  - Continue to minimize sedation  Acute hypoxemic respiratory failure- Patient was on brigatinib as off-label trial which has 5% incidence of pneumonitis, but given his encephalopathy and imaging findings he is more aspiration pneumonitis and third spacing due to poor baseline nutritional status.  Another possibility is progressive neurofibromas affecting secretion clearance and diaphragm strength. - Vent support, VAP prevention bundle -completed ctx course  - weaning PEEP/ FiO2 today and hopeful to start PSV trials -wean methylprednisolone(was placed on to treat any contribution of the brigatinib for pneumonitis) upon chart review vents improved from 100%/10 peep  to 40% 5 peep within 48-72 hours of initiation of methylpred so will taper over 6 weeks per NCCN guidelines (d/w pharmacy).  - goal even to negative balance  - cxr with improved infiltrates, personally reviewed by me - Depending on family decisions, may need tracheostomy and LTACH  Hypernatremia, anasarca - resolved -decrease free water to 100q6 - 2.3L positive over hospital stay - cont scheduled lasix but decrease dose -great uop yesterday but do not need that large volume off in one day, will decrease dose Hypokalemia:  Hypomag:  -resolved  Hypoglycemia -resolved  Labile blood pressures- suspect autonomic instability related to heavy NF2 burden on spinal cord. - continue midodrine but decrease dose, bp trending upward - map goal > 65  R pleural effusion- probably benign transudate from poor oncotic pressure but is neutrophil predominant, regardless seems to have drained well - had 20cc out yesterday.  -cxr without ptx so will d/c tube today  NF2- progressive, treatment currently on hold  Microcytic anemia:  -slow downward trend -no acute indication for transfusion  Prediabetic:  -a1c 6.1 -added ssi yesterday  Best practice:  Diet: NPO, TF per RD  Pain/Anxiety/Delirium protocol (if indicated): prn fentanyl  VAP protocol (if indicated): per protocol DVT prophylaxis: lovenox GI prophylaxis: PPI Glucose control: q4h checks Mobility: BR Code Status: full  Family Communication: pending Disposition: ICU     Critical care time: The patient is critically ill with multiple organ systems failure and requires high complexity decision making for assessment and support, frequent evaluation and titration of therapies, application of advanced monitoring technologies and extensive interpretation of multiple databases.  Critical care time  61mins. This represents my time independent of the NPs time taking care of the pt. This is excluding procedures.    Audria Nine  DO [facility] 08/17/2020, 8:10 AM

## 2020-08-17 NOTE — Progress Notes (Signed)
K 3.6 Electrolytes replaced per protocol 

## 2020-08-17 NOTE — Progress Notes (Signed)
Chest tube (placed for effusion NOT ptx) removed without issue at bedside. vaseline guaze and 4x4 placed with tape.   cxr ordered and pt without ptx after removal.   RN at bedside and assisted.

## 2020-08-17 NOTE — TOC Progression Note (Signed)
Transition of Care [facility]) - Progression Note    Patient Details  Name: Cameron Hale [name]: [id number] Date of Birth: Mar 16, 1976  Transition of Care [facility]) CM/SW Contact  Curlene Labrum, RN Phone Number: 08/17/2020, 11:14 AM  Clinical Narrative:    Case management met with the patient and mother at the bedside to introduce myself and establish communications and support regarding transitions of care.  The patient's mother was polite and asking many questions regarding the patient's VS, CV monitor, and ventilator.  The patient's mother states that the patient lived with her and her husband at home in a single level house in [place], where he was receiving 24-hour care by her and her husband.  The patient currently has a hospital bed, wheelchair ramp, and hoyer lift at the home.  The wife states that she had a family meeting with palliative care yesterday and she is still trying to absorb information and make the best decision for the patient with communication with the patient's wife as well.  The mother states "I want my son to fight through this disease and I just could not go on without him".  The patient's mother states that she feels like the patient is more responsive today.  She states that she was giving him the best of care at home with support from [place].  I did not start conversation regarding LTAC at this point since the two options for LTAC for the patient are not available through [facility].  The patient's wife is processing information received from the family meeting yesterday.  The best options at this point for the patient would be home with palliative care services or SNF placement (if family agreeable).  The patient does not have Medicare days available for LTAC placement.  I will continue to follow the patient and communicate with the family regarding transitions of care needs.  Tracheostomy planned for Monday and will continue to  follow.   Expected Discharge Plan: [place] Barriers to Discharge: Continued Medical Work up  Expected Discharge Plan and Services Expected Discharge Plan: [facility]   Discharge Planning Services: CM Consult   Living arrangements for the past 2 months: [place]: [place] ([facility])     Representative spoke with at [place]: Advanced home health services delivered to patient - living at [employer] home in [place], [place] (SDOH) Interventions    Readmission Risk Interventions Readmission Risk Prevention Plan 08/17/2020  Transportation Screening Complete  Medication Review [employer]) Complete  PCP or Specialist appointment within 3-5 days of discharge Complete  HRI or Home Care Consult Complete  SW Recovery Care/Counseling Consult Complete  Palliative Care Screening Complete  Skilled Nursing Facility Complete  Some recent data might be hidden

## 2020-08-17 NOTE — Plan of Care (Signed)
  Problem: Clinical Measurements: Goal: Will remain free from infection Outcome: Progressing Goal: Cardiovascular complication will be avoided Outcome: Progressing   Problem: Activity: Goal: Risk for activity intolerance will decrease Outcome: Progressing   Problem: Nutrition: Goal: Adequate nutrition will be maintained Outcome: Progressing   Problem: Coping: Goal: Level of anxiety will decrease Outcome: Progressing   Problem: Elimination: Goal: Will not experience complications related to urinary retention Outcome: Progressing   Problem: Pain Managment: Goal: General experience of comfort will improve Outcome: Progressing   Problem: Safety: Goal: Ability to remain free from injury will improve Outcome: Progressing   Problem: Skin Integrity: Goal: Risk for impaired skin integrity will decrease Outcome: Progressing   Problem: Education: Goal: Knowledge of General Education information will improve Description: Including pain rating scale, medication(s)/side effects and non-pharmacologic comfort measures Outcome: Not Progressing   Problem: Health Behavior/Discharge Planning: Goal: Ability to manage health-related needs will improve Outcome: Not Progressing   Problem: Clinical Measurements: Goal: Ability to maintain clinical measurements within normal limits will improve Outcome: Not Progressing Goal: Diagnostic test results will improve Outcome: Not Progressing Goal: Respiratory complications will improve Outcome: Not Progressing   Problem: Elimination: Goal: Will not experience complications related to bowel motility Outcome: Not Progressing

## 2020-08-17 NOTE — TOC Progression Note (Signed)
Transition of Care (TOC) - Progression Note    Patient Details  Name: Cameron Hale [name]: [id number] Date of Birth: 07/11/1976  Transition of Care (TOC) CM/SW Contact  Michelle R [place], RN Phone Number: 08/17/2020, 10:18 AM  Clinical Narrative: Case management is following the patient for possible admission opportunities to present to the family.  I spoke with [facility] and were unable to offer a bed to the patient at this time.  The patient does not have any Medicare days left for LTAC placement.  Once the patient is medically stable for transfer out of the hospital - SNF placement with trach/ vent care may be the best option for the patient.  [facility] acute unit may be an alternative option after the patient receives a tracheostomy.  No available offers for LTAC available at this time.  Palliative care met with the family yesterday and I will continue to explore transitions of care needs for the patient and family.   Expected Discharge Plan: Long Term Acute Care (LTAC) Barriers to Discharge: Continued Medical Work up  Expected Discharge Plan and Services Expected Discharge Plan: Long Term Acute Care (LTAC)       Living arrangements for the past 2 months: Single Family Home                     Social Determinants of Health (SDOH) Interventions    Readmission Risk Interventions No flowsheet data found.  

## 2020-08-17 NOTE — Progress Notes (Signed)
Nutrition Follow-up  DOCUMENTATION CODES:   Not applicable  INTERVENTION:   Continue tube feeds via OG tube: - Vital AF 1.2 @ 65 ml/hr (1560 ml/day) - Free water flushes per CCM, currently 100 ml q 6 hours  Tube feeding regimen with current free water flushes provides 1872 kcal, 117 grams of protein, and 1665 ml of H2O.   - Recommend Cortrak placement after trach on Monday  NUTRITION DIAGNOSIS:   Increased nutrient needs related to wound healing as evidenced by estimated needs.  Ongoing  GOAL:   Provide needs based on ASPEN/SCCM guidelines  Met via TF  MONITOR:   Vent status, Labs, Weight trends, TF tolerance, Skin, I & O's  REASON FOR ASSESSMENT:   Ventilator, Consult Enteral/tube feeding initiation and management, Assessment of nutrition requirement/status  ASSESSMENT:   44 year old male who presented to the ED on 9/21 with AMS. 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 Pt admitted with sepsis, left upper lobe aspiration pneumonia  9/25 - intubated, chest tube insertion 9/30 - chest tube removed  Discussed pt with RN and during ICU rounds. Pt tolerating tube feeds without issue. Conversations regarding [place] are ongoing. At this point, plan is for tracheostomy on Monday. RD will follow for need for Cortrak.  OG tube remains in place with tube feeds infusing at goal.  Pt continues to have non-pitting edema to BUE and BLE. Difficult to determine dry weight at this time.  Current TF: Vital AF 1.2 @ 65 ml/hr, free water 100 ml q 6 hours  Patient is currently intubated on ventilator support MV: 9.9 L/min Temp (24hrs), Avg:98.1 F (36.7 C), Min:97.3 F (36.3 C), Max:98.6 F (37 C) BP (cuff): 114/83 MAP (cuff): 94  Medications reviewed and include: lasix, SSI q 4 hours, solu-medrol, protonix, keppra  Labs reviewed: phosphorus 2.2, hemoglobin 8.8 CBG's: 132-182  x 24 hours  UOP: 6125 ml x 24 hours Stool: 350 ml x 24 hours CT: 20 ml x 24 hours I/O's: +1.8 L since admit  NUTRITION - FOCUSED PHYSICAL EXAM:    Most Recent Value  Orbital Region No depletion  Upper Arm Region No depletion  Thoracic and Lumbar Region Moderate depletion  Buccal Region Unable to assess  Temple Region Mild depletion  Clavicle Bone Region Mild depletion  Clavicle and Acromion Bone Region Mild depletion  Scapular Bone Region Unable to assess  Dorsal Hand No depletion  Patellar Region Mild depletion  Anterior Thigh Region Moderate depletion  Posterior Calf Region Mild depletion  Edema (RD Assessment) Mild  [BUE, BLE]  Hair Reviewed  Eyes Reviewed  Mouth Unable to assess  Skin Reviewed  Nails Reviewed       Diet Order:   Diet Order            Diet NPO time specified  Diet effective now                 EDUCATION NEEDS:   No education needs have been identified at this time  Skin:  Skin Assessment: Skin Integrity Issues: Unstageable: sacrum (per WOC note)  Last BM:  08/16/20 350 ml x 24 hours via rectal tube  Height:   Ht Readings from Last 1 Encounters:  08/12/20 '5\' 10"'  (1.778 m)    Weight:   Wt Readings from Last 1 Encounters:  08/14/20 72.4 kg    Ideal Body Weight:  68 kg (adjusted for quadriplegia)  BMI:  Body mass index  is 22.9 kg/m.  Estimated Nutritional Needs:   Kcal:  2890  Protein:  110-130 grams  Fluid:  >/= 1.8 L    Gaynell Face, MS, RD, LDN Inpatient Clinical Dietitian Please see AMiON for contact information.

## 2020-08-17 NOTE — Progress Notes (Signed)
STROKE TEAM PROGRESS NOTE   INTERVAL HISTORY His mother is at bedside.  He remains intubated for respiratory failure and inability to protect his airway.Marland Kitchen His neurological exam much improved today with patient alert and interactive.  He is following commands by closing his eyelids and nodding his head appropriately.  Is able to stick out his tongue.  Is blind in 1 eye and has decreased hearing in the opposite ear.  He remains quadriplegic and unable to move all 4 extremities.  Vital signs are stable.. Continues to have mild involuntary twitching's of his lips tongue and jaw. Vitals:   08/17/20 1000 08/17/20 1100 08/17/20 1150 08/17/20 1200  BP: 114/76 114/83  136/89  Pulse: 74 71  65  Resp: '17 17  16  ' Temp:   98.4 F (36.9 C)   TempSrc:   Oral   SpO2: 100% 99%  99%  Weight:      Height:       CBC:  Recent Labs  Lab 08/16/20 0411 08/17/20 0338  WBC 10.0 10.4  HGB 8.4* 8.8*  HCT 25.6* 26.4*  MCV 78.5* 76.1*  PLT 121* 546   Basic Metabolic Panel:  Recent Labs  Lab 08/16/20 0411 08/16/20 0411 08/16/20 1531 08/17/20 0338  NA 140   < > 140 139  K 3.1*   < > 4.3 3.6  CL 109   < > 110 106  CO2 19*   < > 22 24  GLUCOSE 169*   < > 174* 154*  BUN 32*   < > 33* 35*  CREATININE 0.63   < > 0.49* 0.49*  CALCIUM 7.6*   < > 7.7* 7.9*  MG 1.9  --   --  2.2  PHOS 2.7  --   --  2.2*   < > = values in this interval not displayed.   Lipid Panel:  Recent Labs  Lab 08/14/20 0455 08/14/20 0455 08/16/20 1531  CHOL 139  --   --   TRIG 78   < > 117  HDL 45  --   --   CHOLHDL 3.1  --   --   VLDL 16  --   --   LDLCALC 78  --   --    < > = values in this interval not displayed.   HgbA1c:  Recent Labs  Lab 08/14/20 0455  HGBA1C 6.1*   Urine Drug Screen:  No results for input(s): LABOPIA, COCAINSCRNUR, LABBENZ, AMPHETMU, THCU, LABBARB in the last 168 hours.  Alcohol Level No results for input(s): ETH in the last 168 hours.  IMAGING past 24 hours DG CHEST PORT 1 VIEW  Result  Date: 08/17/2020 CLINICAL DATA:  Intubation.  Chest tube.  Respiratory failure. EXAM: PORTABLE CHEST 1 VIEW COMPARISON:  08/16/2020. FINDINGS: Endotracheal tube, NG tube, right PICC line, right chest tube in stable position. No pneumothorax. Heart size normal. Persistent left lower lobe infiltrate. Persistent mild right upper lobe infiltrate. No pleural effusion. Prior thoracic spine fusion. IMPRESSION: 1. Lines and tubes including right chest tube in stable position. No pneumothorax. 2. Persistent left lower lobe infiltrate. Persistent mild right upper lobe infiltrate. Chest is unchanged from prior exam. Electronically Signed   By: Marcello Moores  Register   On: 08/17/2020 08:27    PHYSICAL EXAM Obese middle-aged Caucasian male who is intubated.  Not in distress.  He is having continuous involuntary twitching's of his jaw and lip. . Afebrile. Head is nontraumatic. Neck is supple without bruit.    Cardiac exam  no murmur or gallop. Lungs are clear to auscultation. Distal pulses are well felt. Neurological Exam :  Patient is  intubated.  He is awake and interactive today.  He nods appropriately to questions.  He is able to protrude his tongue.  He can follow gaze in all directions.  Blinks to threat on the left but not the right. Fundi not visualized.  Tongue is midline with involuntary twitching's noted involving tongue, jaw lips and tongue.  Motor system exam shows no spontaneous movements.  Withdraws barely to sternal rub with partial flexion in the lower extremities and slight in the right upper but none in the left upper extremities.  Deep tendon reflexes are depressed.  Plantars of both mute.   ASSESSMENT/PLAN Mr. SCHNEUR CROWSON is a 44 y.o. 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 Developed hypoglycemia, hypotension and increased WOB. admitted to ICU.    Toxic metabolic encephalopathy versus seizures versus status epilepticus  Bicerebral medial frontal infarcts likely secondary to involvement of anterior cerebral arteries due to compression from  long-standing NF2 changes and meningiomatosis .  Initial presentation with comatose state which now appears to have improved and is now able to follow commands  unresponsive     CT head ? R PLIC hypodensity. No new abnormality. Extensive NF2 changes   MRI  B frontal ACA infarcts. Small superior vermian infarct. Chronic NF2 changes. parafalcine meningioma w/ bifrontal edema.   CT head no new ischemia . Extensive NF2   CTA head & neck no LVO or significant stenosis. Multifocal lung opacities concerning for PNA. Large L pleural effusion  2D Echo w/ bubble normal ejection fraction.  No cardiac source of embolism.    LDL 78  HgbA1c 6.1  VTE prophylaxis - Lovenox 40 mg sq daily   No antithrombotic prior to admission, now on No antithrombotic. Add low dose aspirin via tube  Therapy recommendations:  pending   Disposition:  pending   Acute Hypoxemic Respiratory Failure  Intubated, sedated  CCM on board  Atrial Fibrillation  Noted at time of intubation 9/25 by Dr. Tamala Julian  Not an [facility] candidate given vascular cerebral lesion   RN clarified on tele no hx AF only bradycardia w/ AV block   Labile BP  Variable  . From stroke standpoint, ok for Permissive hypertension (OK if < 220/120) but gradually normalize in 5-7 days . Long-term BP goal normotensive  Hyperlipidemia  Home meds:  No statin  LDL 78, goal < 70  Add statin (lipitor 40) at discharge based on plan of care   Hypoglycemia  HgbA1c 6.1  Dysphagia . NPO   Other Stroke Risk Factors  Hx ETOH use  Other Active Problems  NF2  Acute metabolic encephalopathy   Hyponatremia,  anasarca  R pleural effusions  Seizures on keppra PTA. EEG neg [facility] day # 9 Patient has bilateral medial frontal infarcts likely due to involvement of anterior cerebral arteries due to his extensive meningiomatosis from his neurofibromatosis.  Atrial fibrillation was transiently documented during intubation and could also have contributed but patient is not a good long-term anticoagulation candidate given extensive vascular brain tumors.  Continue aspirin. .  Continue Keppra for seizures.  Patient's prognosis appears improved somewhat due to improvement in his mental  status but quality of life will likely remain poor given his quadriplegic bedridden state.  Patient is likely going to survive with  requiring tracheostomy, PEG tube and  24-hour nursing care.  I explained this to the patient's mother at the bedside and answered questions... Discussed with Dr. Ruthann Cancer critical care medicine as well as palliative care nurse practitioner and answered questions.. I subsequently met with the [facility] and charge nurse of the unit who expressed to me concern from the patient's family about my neurological prognostication and patient's family were getting mixed messages from talking to neurologist Dr. Clovis Riley at [facility] and Dr. Tamala Julian critical care medicine.  I offered to stop following this case if family did not want need on the case anymore.  Discussed with Dr. Ruthann Cancer This patient is critically ill and at significant risk of neurological worsening, death and care requires constant monitoring of vital signs, hemodynamics,respiratory and cardiac monitoring, extensive review of multiple databases, frequent neurological assessment, discussion with family, other specialists and medical decision making of high complexity.I have made any additions or clarifications directly to the above note.This critical care time does not reflect procedure time, or teaching time or supervisory time of PA/NP/Med Resident etc but  could involve care discussion time.  I spent 40 minutes of neurocritical care time  in the care of  this patient.    Antony Contras, MD  To contact Stroke Continuity provider, please refer to [email or web address] After hours, contact General Neurology

## 2020-08-18 DIAGNOSIS — G9340 Encephalopathy, unspecified: Secondary | ICD-10-CM

## 2020-08-18 DIAGNOSIS — J189 Pneumonia, unspecified organism: Secondary | ICD-10-CM

## 2020-08-18 DIAGNOSIS — Z9911 Dependence on respirator [ventilator] status: Secondary | ICD-10-CM

## 2020-08-18 LAB — RETICULOCYTES
Immature Retic Fract: 16.3 % — ABNORMAL HIGH (ref 2.3–15.9)
RBC.: 3.29 MIL/uL — ABNORMAL LOW (ref 4.22–5.81)
Retic Count, Absolute: 18.4 10*3/uL — ABNORMAL LOW (ref 19.0–186.0)
Retic Ct Pct: 0.6 % (ref 0.4–3.1)

## 2020-08-18 LAB — CBC
HCT: 26.6 % — ABNORMAL LOW (ref 39.0–52.0)
Hemoglobin: 8.8 g/dL — ABNORMAL LOW (ref 13.0–17.0)
MCH: 25.4 pg — ABNORMAL LOW (ref 26.0–34.0)
MCHC: 33.1 g/dL (ref 30.0–36.0)
MCV: 76.7 fL — ABNORMAL LOW (ref 80.0–100.0)
Platelets: 208 10*3/uL (ref 150–400)
RBC: 3.47 MIL/uL — ABNORMAL LOW (ref 4.22–5.81)
RDW: 17.2 % — ABNORMAL HIGH (ref 11.5–15.5)
WBC: 12.3 10*3/uL — ABNORMAL HIGH (ref 4.0–10.5)
nRBC: 0 % (ref 0.0–0.2)

## 2020-08-18 LAB — COMPREHENSIVE METABOLIC PANEL
ALT: 87 U/L — ABNORMAL HIGH (ref 0–44)
AST: 52 U/L — ABNORMAL HIGH (ref 15–41)
Albumin: 1.9 g/dL — ABNORMAL LOW (ref 3.5–5.0)
Alkaline Phosphatase: 94 U/L (ref 38–126)
Anion gap: 8 (ref 5–15)
BUN: 38 mg/dL — ABNORMAL HIGH (ref 6–20)
CO2: 25 mmol/L (ref 22–32)
Calcium: 7.9 mg/dL — ABNORMAL LOW (ref 8.9–10.3)
Chloride: 107 mmol/L (ref 98–111)
Creatinine, Ser: 0.42 mg/dL — ABNORMAL LOW (ref 0.61–1.24)
GFR calc Af Amer: >60 mL/min (ref >60)
GFR calc non Af Amer: >60 mL/min (ref >60)
Glucose, Bld: 147 mg/dL — ABNORMAL HIGH (ref 70–99)
Potassium: 3.8 mmol/L (ref 3.5–5.1)
Sodium: 140 mmol/L (ref 135–145)
Total Bilirubin: 0.7 mg/dL (ref 0.3–1.2)
Total Protein: 4.7 g/dL — ABNORMAL LOW (ref 6.5–8.1)

## 2020-08-18 LAB — POCT I-STAT 7, (LYTES, BLD GAS, ICA,H+H)
Acid-Base Excess: 3 mmol/L — ABNORMAL HIGH (ref 0.0–2.0)
Bicarbonate: 26.5 mmol/L (ref 20.0–28.0)
Calcium, Ion: 1.17 mmol/L (ref 1.15–1.40)
HCT: 25 % — ABNORMAL LOW (ref 39.0–52.0)
Hemoglobin: 8.5 g/dL — ABNORMAL LOW (ref 13.0–17.0)
O2 Saturation: 100 %
Patient temperature: 97.3
Potassium: 3.5 mmol/L (ref 3.5–5.1)
Sodium: 142 mmol/L (ref 135–145)
TCO2: 28 mmol/L (ref 22–32)
pCO2 arterial: 35.6 mmHg (ref 32.0–48.0)
pH, Arterial: 7.476 — ABNORMAL HIGH (ref 7.350–7.450)
pO2, Arterial: 163 mmHg — ABNORMAL HIGH (ref 83.0–108.0)

## 2020-08-18 LAB — IRON AND TIBC
Iron: 57 ug/dL (ref 45–182)
Saturation Ratios: 25 % (ref 17.9–39.5)
TIBC: 228 ug/dL — ABNORMAL LOW (ref 250–450)
UIBC: 171 ug/dL

## 2020-08-18 LAB — GLUCOSE, CAPILLARY
Glucose-Capillary: 108 mg/dL — ABNORMAL HIGH (ref 70–99)
Glucose-Capillary: 133 mg/dL — ABNORMAL HIGH (ref 70–99)
Glucose-Capillary: 135 mg/dL — ABNORMAL HIGH (ref 70–99)
Glucose-Capillary: 140 mg/dL — ABNORMAL HIGH (ref 70–99)
Glucose-Capillary: 143 mg/dL — ABNORMAL HIGH (ref 70–99)

## 2020-08-18 LAB — FOLATE: Folate: 10.7 ng/mL (ref >5.9)

## 2020-08-18 LAB — VITAMIN B12: Vitamin B-12: 809 pg/mL (ref 180–914)

## 2020-08-18 LAB — FERRITIN: Ferritin: 167 ng/mL (ref 24–336)

## 2020-08-18 MED ORDER — MIDODRINE HCL 5 MG PO TABS
5.0000 mg | ORAL_TABLET | Freq: Two times a day (BID) | ORAL | Status: DC
  Administered 2020-08-19 – 2020-08-21 (×5): 5 mg
  Filled 2020-08-18 (×6): qty 1

## 2020-08-18 MED ORDER — SULFAMETHOXAZOLE-TRIMETHOPRIM 800-160 MG PO TABS
1.0000 | ORAL_TABLET | ORAL | Status: DC
  Administered 2020-08-18 – 2020-08-21 (×2): 1
  Filled 2020-08-18 (×2): qty 1

## 2020-08-18 MED ORDER — MIDODRINE HCL 5 MG PO TABS
2.5000 mg | ORAL_TABLET | Freq: Two times a day (BID) | ORAL | Status: DC
  Administered 2020-08-18: 2.5 mg
  Filled 2020-08-18: qty 1

## 2020-08-18 NOTE — Progress Notes (Addendum)
NAME:  Cameron Hale, [name]:  [id number], DOB:  [birth year], LOS: 68 ADMISSION DATE:  08/08/2020, CONSULTATION DATE:  08/12/20 REFERRING MD:  Opyd, CHIEF COMPLAINT:  unresponsive   Brief History   43 year old man with hx of type 2 neurofibromatosis, quadriplegia, blindness, hearing loss (baseline only able to talk) presenting with recurrent unresponsive episodes as well as generalized seizure activity.  Brought to [facility] for further management.  Unfortunately less responsive this AM with hypotension and hypoxemia so PCCM consulted.  Patient unable to provide any history.  Normally followed by [facility] but due to instability was brought into [place].  Past Medical History  Neurofibromatosis 2 Neurosurgical intervention March 2021 with postoperative course complicated by functional quadriplegia, blindness, hearing loss (baseline only able to talk)  Decubitus pressue ulcer  Significant Hospital Events   9/21 admitted to [facility] 9/25 ETT, PICC, arterial line, right chest tube 9/30 See progress note from Dr. Marcheta Grammes conversation with mother, patient exam  Consults:  Neurology  Procedures:  R PICC 9/24 >> ETT 9/25 >> L radial aline 9/25 >> out R pigtail CT 9/25 >> 9/30  Significant Diagnostic Tests:  CT Brain 9/21>> no acute infarct, extensive meningiomatosis.  Unchanged prominent vasogenic edema within the underlying mid-to-anterior frontal lobes and crossing the corpus callosum. As before, there is herniation of the rectus gyri into the sella Turcica.  EEG 9/21 >> no evidence of seizure CT Head 9/26 >> Motion degraded exam. Question an approximate 1.3 cm area of increased hypodensity involving the posterior limb of the right internal capsule. While this finding may be artifactual in nature given motion artifact on this exam, a possible evolving ischemic infarct is difficult to exclude, and could be considered in the correct clinical setting. MR brain 9/26 >> Cortically based acute infarcts  involving the bilateral frontal lobes most prominent in the ACA territory. Small superior vermian acute infarct. Continuous EEG 9/26 >> no evidence of seizures, EEG discontinued  CT Head 9/27 >> No significant interval change of multifocal evolving ischemic infarcts, better evaluated on recent brain MRI. No hemorrhagic transformation or other complication. Extensive underlying changes related to NF2, stable. No new intracranial abnormality. CTA head/ neck 9/27 >> Negative CTA for large vessel occlusion. No hemodynamically significant or correctable stenosis. The intracranial circulation is diffusely diminutive but remains patent. Multifocal opacities throughout the visualized lungs, concerning for multifocal infection/pneumonia. Large layering left pleural effusion, partially visualized.  Micro Data:  9/21 COVID >> neg 9/21 HIV >> negative 9/21 UC >> neg 9/21 BCx 2 >> neg 9/24 MRSA >> neg 9/25 R pleural fluid cx >> negative  9/25 R pleural fluid g/s >> negative 9/26 Tracheal aspirate >> few candida albicans  Antimicrobials:  Vanc 9/21 >> 9/24 Aztreonam 9/21 Flagyl 9/21 >> 9/25 Cefepime 9/21 >> 9/25 Ceftriaxone 9/26 >> 9/29  Interim history/subjective:  Afebrile  On vent - 40% FiO2, PEEP 5 Glucose range 138 - 174 I/O UOP 3.1L, -46ml in last 24 hours  Objective   Blood pressure 110/80, pulse 74, temperature (!) 97.3 F (36.3 C), temperature source Oral, resp. rate 16, height 5\' 10"  (1.778 m), weight 72.4 kg, SpO2 100 %.    Vent Mode: PRVC FiO2 (%):  [40 %] 40 % Set Rate:  [16 bmp] 16 bmp Vt Set:  [580 mL] 580 mL PEEP:  [5 cmH20] 5 cmH20 Plateau Pressure:  [12 cmH20-14 cmH20] 14 cmH20   Intake/Output Summary (Last 24 hours) at 08/18/2020 1214 Last data filed at 08/18/2020 1100 Gross per  24 hour  Intake 3646.17 ml  Output 4350 ml  Net -703.83 ml   Filed Weights   08/08/20 1430 08/13/20 0500 08/14/20 0500  Weight: 65.3 kg 72.1 kg 72.4 kg    Examination: General:  chronically ill appearing adult male lying in bed on vent in NAD HEENT: MM pink/moist, ETT, anicteric, makes eye contact Neuro: on vent, tracks provider, smiles, nods answers to yes/no questions (denies pain) CV: s1s2 rrr, no m/r/g PULM: non-labored on vent, lungs bilaterally clear anterior  GI: soft, bsx4 active  Extremities: warm/dry, generalized trace to 1+ edema  Skin: no rashes or lesions   Recent Labs  Lab 08/13/20 0415 08/13/20 0501 08/14/20 0455 08/14/20 0455 08/15/20 0413 08/15/20 0413 08/16/20 0411 08/16/20 0411 08/16/20 1531 08/16/20 1531 08/17/20 0338 08/18/20 0254  NA 148*   < > 147*   < > 146*  --  140  --  140  --  139 140  K 2.7*   < > 4.1   < > 3.2*   < > 3.1*   < > 4.3   < > 3.6 3.8  CL 117*   < > 120*   < > 117*  --  109  --  110  --  106 107  CO2 17*   < > 15*   < > 19*  --  19*  --  22  --  24 25  GLUCOSE 93   < > 109*   < > 161*  --  169*  --  174*  --  154* 147*  BUN 18   < > 21*   < > 29*  --  32*  --  33*  --  35* 38*  CREATININE 0.53*   < > 0.67   < > 0.64  --  0.63  --  0.49*  --  0.49* 0.42*  CALCIUM 8.5*   < > 8.4*   < > 8.0*  --  7.6*  --  7.7*  --  7.9* 7.9*  MG 2.5*  --  2.0  --  2.2  --  1.9  --   --   --  2.2  --   PHOS 3.2  --  2.7  --  2.3*  --  2.7  --   --   --  2.2*  --    < > = values in this interval not displayed.   Recent Labs  Lab 08/16/20 0411 08/17/20 0338 08/18/20 0254  HGB 8.4* 8.8* 8.8*  HCT 25.6* 26.4* 26.6*  WBC 10.0 10.4 12.3*  PLT 121* 158 208     Resolved Hospital Problem list   Hypernatremia  Anasarca   Assessment & Plan:   Acute Metabolic Encephalopathy, Persistent hypoglycemia, new ischemic infarcts 9/26 Baseline able to speak but blind and hard of hearing, functionally quadriplegic. Seizures ruled out by repeated EEGs.  Possible component of cefepime related encephalopathy- cefepime stopped 9/25.  Bilateral ACA strokes likely due to mass effect of patient's meningiomatosis.  Also has progressive NF2 of  brain and spinal cord.   -minimize all sedating medications as able, balancing patient comfort  -Per Neurology "Patient is likely going to survive with very poor and limited quality of life requiring tracheostomy, PEG tube and prolonged ventilatory support and 24-hour nursing care.  I explained this to the patient's mother who is not willing to give up on him and wants to hang on to him as long as possible". -supportive care  -appreciate palliative care assistance  with goals of care > largest question at this point is would family want to proceed with trach and I suspect they will   Acute Hypoxemic Respiratory Failure  Patient was on brigatinib as off-label trial which has 5% incidence of pneumonitis, but given his encephalopathy and imaging findings he is more aspiration pneumonitis and third spacing due to poor baseline nutritional status.  Another possibility is progressive neurofibromas affecting secretion clearance and diaphragm strength.  Completed abx.  -PRVC 8cc/kg  -wean PEEP / FiO2 for sats > 90% -reduce rate to 10, assess ABG in 1 hour, ? If we are overbreathing for him -continue solumedrol for possible contribution of pneumonitis (O2 needs improved within 48-72 hours of initiation, so plan for taper over 6 weeks -VAP prevention measures  -daily SBT / PSV trial  -family deciding on tracheostomy placement -goal fluid balance > even to negative  -follow intermittent CXR  -pending family decisions, patient may need trach / LTACH -add PJP prophylaxis with prolonged steroids  Hypernatremia, anasarca -free water 100 ml Q6 -lasix 20 mg BID  -follow electrolytes closely    Hypokalemia Hypomagnesemia -monitor, replace as indicated   Hypo / Hyperglycemia -monitor  -SSI   Labile blood pressures Suspect autonomic instability related to heavy NF2 burden on spinal cord. -decrease midodrine to 2.5 mg BID -MAP goal >65   R Pleural Effusion Suspect benign transudate from poor oncotic  pressure but is neutrophil predominant, regardless seems to have drained well -follow serial CXR   NF2 Progressive, treatment currently on hold -supportive care   Microcytic Hypochromic Anemia -assess anemia panel  -transfuse for Hgb <7%  Prediabetic A1c 6.1 -SSI   Severe Protein Calorie Malnutrition  -TF per Nutrition  -additional prosource  Best practice:  Diet: NPO, TF per RD  Pain/Anxiety/Delirium protocol (if indicated): PRN fentanyl  VAP protocol (if indicated): per protocol DVT prophylaxis: lovenox GI prophylaxis: PPI Glucose control: q4h checks Mobility: BR Code Status: Full code  Family Communication: Sister updated at bedside 10/1 Disposition: ICU  Critical care time:  40 minutes     [name], MSN, NP-C [facility] 08/18/2020, 12:14 PM   Please see [email or web address] for pager details.

## 2020-08-19 ENCOUNTER — Inpatient Hospital Stay (HOSPITAL_COMMUNITY): Payer: Medicare Other

## 2020-08-19 LAB — GLUCOSE, CAPILLARY
Glucose-Capillary: 102 mg/dL — ABNORMAL HIGH (ref 70–99)
Glucose-Capillary: 104 mg/dL — ABNORMAL HIGH (ref 70–99)
Glucose-Capillary: 123 mg/dL — ABNORMAL HIGH (ref 70–99)
Glucose-Capillary: 134 mg/dL — ABNORMAL HIGH (ref 70–99)
Glucose-Capillary: 154 mg/dL — ABNORMAL HIGH (ref 70–99)
Glucose-Capillary: 155 mg/dL — ABNORMAL HIGH (ref 70–99)

## 2020-08-19 LAB — CBC
HCT: 24.6 % — ABNORMAL LOW (ref 39.0–52.0)
Hemoglobin: 8 g/dL — ABNORMAL LOW (ref 13.0–17.0)
MCH: 25.6 pg — ABNORMAL LOW (ref 26.0–34.0)
MCHC: 32.5 g/dL (ref 30.0–36.0)
MCV: 78.6 fL — ABNORMAL LOW (ref 80.0–100.0)
Platelets: 250 10*3/uL (ref 150–400)
RBC: 3.13 MIL/uL — ABNORMAL LOW (ref 4.22–5.81)
RDW: 17.6 % — ABNORMAL HIGH (ref 11.5–15.5)
WBC: 15.5 10*3/uL — ABNORMAL HIGH (ref 4.0–10.5)
nRBC: 0 % (ref 0.0–0.2)

## 2020-08-19 LAB — COMPREHENSIVE METABOLIC PANEL
ALT: 89 U/L — ABNORMAL HIGH (ref 0–44)
AST: 38 U/L (ref 15–41)
Albumin: 1.9 g/dL — ABNORMAL LOW (ref 3.5–5.0)
Alkaline Phosphatase: 80 U/L (ref 38–126)
Anion gap: 11 (ref 5–15)
BUN: 41 mg/dL — ABNORMAL HIGH (ref 6–20)
CO2: 25 mmol/L (ref 22–32)
Calcium: 8 mg/dL — ABNORMAL LOW (ref 8.9–10.3)
Chloride: 105 mmol/L (ref 98–111)
Creatinine, Ser: 0.42 mg/dL — ABNORMAL LOW (ref 0.61–1.24)
GFR calc Af Amer: >60 mL/min (ref >60)
GFR calc non Af Amer: >60 mL/min (ref >60)
Glucose, Bld: 153 mg/dL — ABNORMAL HIGH (ref 70–99)
Potassium: 4 mmol/L (ref 3.5–5.1)
Sodium: 141 mmol/L (ref 135–145)
Total Bilirubin: 0.8 mg/dL (ref 0.3–1.2)
Total Protein: 4.5 g/dL — ABNORMAL LOW (ref 6.5–8.1)

## 2020-08-19 LAB — TRIGLYCERIDES: Triglycerides: 93 mg/dL (ref <150)

## 2020-08-19 NOTE — Progress Notes (Signed)
NAME:  FROILAN MCLEAN, [name]:  [id number], DOB:  03/24/1976, LOS: 78 ADMISSION DATE:  08/08/2020, CONSULTATION DATE:  08/12/20 REFERRING MD:  Opyd, CHIEF COMPLAINT:  unresponsive   Brief History   44 year old man with hx of type 2 neurofibromatosis, quadriplegia, blindness, hearing loss (baseline only able to talk) presenting with recurrent unresponsive episodes as well as generalized seizure activity.  Brought to [facility] for further management.  Unfortunately less responsive this AM with hypotension and hypoxemia so PCCM consulted.  Patient unable to provide any history.  Normally followed by [facility] but due to instability was brought into [place].  Past Medical History  Neurofibromatosis 2 Neurosurgical intervention March 2021 with postoperative course complicated by functional quadriplegia, blindness, hearing loss (baseline only able to talk)  Decubitus pressue ulcer  Significant Hospital Events   9/21 admitted to [facility] 9/25 ETT, PICC, arterial line, right chest tube 9/30 See progress note from Dr. Marcheta Grammes conversation with mother, patient exam 10/01 On vent, interactive with nodding yes/no   Consults:  Neurology  Procedures:  R PICC 9/24 >> ETT 9/25 >> L radial aline 9/25 >> out R pigtail CT 9/25 >> 9/30  Significant Diagnostic Tests:  CT Brain 9/21>> no acute infarct, extensive meningiomatosis.  Unchanged prominent vasogenic edema within the underlying mid-to-anterior frontal lobes and crossing the corpus callosum. As before, there is herniation of the rectus gyri into the sella Turcica.  EEG 9/21 >> no evidence of seizure CT Head 9/26 >> Motion degraded exam. Question an approximate 1.3 cm area of increased hypodensity involving the posterior limb of the right internal capsule. While this finding may be artifactual in nature given motion artifact on this exam, a possible evolving ischemic infarct is difficult to exclude, and could be considered in the correct clinical setting. MR  brain 9/26 >> Cortically based acute infarcts involving the bilateral frontal lobes most prominent in the ACA territory. Small superior vermian acute infarct. Continuous EEG 9/26 >> no evidence of seizures, EEG discontinued  CT Head 9/27 >> No significant interval change of multifocal evolving ischemic infarcts, better evaluated on recent brain MRI. No hemorrhagic transformation or other complication. Extensive underlying changes related to NF2, stable. No new intracranial abnormality. CTA head/ neck 9/27 >> Negative CTA for large vessel occlusion. No hemodynamically significant or correctable stenosis. The intracranial circulation is diffusely diminutive but remains patent. Multifocal opacities throughout the visualized lungs, concerning for multifocal infection/pneumonia. Large layering left pleural effusion, partially visualized.  Micro Data:  9/21 COVID >> neg 9/21 HIV >> negative 9/21 UC >> neg 9/21 BCx 2 >> neg 9/24 MRSA >> neg 9/25 R pleural fluid cx >> negative  9/25 R pleural fluid g/s >> negative 9/26 Tracheal aspirate >> few candida albicans  Antimicrobials:  Vanc 9/21 >> 9/24 Aztreonam 9/21 Flagyl 9/21 >> 9/25 Cefepime 9/21 >> 9/25 Ceftriaxone 9/26 >> 9/29  Interim history/subjective:  Afebrile / WBC 15.5 Remains on vent - 35%, 10 PEEP  Glucose range 120-150's I/O 2.2L UOP, -1.1L in last 24 hours   Objective   Blood pressure 95/73, pulse 79, temperature 98.4 F (36.9 C), resp. rate 12, height 5\' 10"  (1.778 m), weight 72.4 kg, SpO2 94 %.    Vent Mode: PRVC FiO2 (%):  [35 %-40 %] 35 % Set Rate:  [10 bmp-16 bmp] 10 bmp Vt Set:  [580 mL] 580 mL PEEP:  [5 cmH20] 5 cmH20 Plateau Pressure:  [12 cmH20-14 cmH20] 12 cmH20   Intake/Output Summary (Last 24 hours) at 08/19/2020 [phone]  Last data filed at 08/19/2020 0900 Gross per 24 hour  Intake 920 ml  Output 2383 ml  Net -1463 ml   Filed Weights   08/08/20 1430 08/13/20 0500 08/14/20 0500  Weight: 65.3 kg 72.1 kg 72.4  kg    Examination: General: chronically ill appearing adult male lying in bed on vent in NAD  HEENT: MM pink/moist, remote surgical scar on scalp, anicteric  Neuro: opens eyes to voice, nods yes/no to questions  CV: s1s2 RRR, no m/r/g PULM: non-labored on vent, breathing over set rate of 10, lungs bilaterally clear  GI: soft, bsx4 active  Extremities: warm/dry, muscle wasting, trace generalized edema  Skin: no rashes or lesions  PCXR 10/2 >> images personally reviewed, ETT in good position, no significant infiltrate / airspace disease, spinal hardware noted   Recent Labs  Lab 08/13/20 0415 08/13/20 0501 08/14/20 0455 08/14/20 0455 08/15/20 0413 08/15/20 0413 08/16/20 0411 08/16/20 0411 08/16/20 1531 08/16/20 1531 08/17/20 0338 08/17/20 0338 08/18/20 0254 08/18/20 0254 08/18/20 1550 08/19/20 0500  NA 148*   < > 147*   < > 146*   < > 140   < > 140  --  139  --  140  --  142 141  K 2.7*   < > 4.1   < > 3.2*   < > 3.1*   < > 4.3   < > 3.6   < > 3.8   < > 3.5 4.0  CL 117*   < > 120*   < > 117*   < > 109  --  110  --  106  --  107  --   --  105  CO2 17*   < > 15*   < > 19*   < > 19*  --  22  --  24  --  25  --   --  25  GLUCOSE 93   < > 109*   < > 161*   < > 169*  --  174*  --  154*  --  147*  --   --  153*  BUN 18   < > 21*   < > 29*   < > 32*  --  33*  --  35*  --  38*  --   --  41*  CREATININE 0.53*   < > 0.67   < > 0.64   < > 0.63  --  0.49*  --  0.49*  --  0.42*  --   --  0.42*  CALCIUM 8.5*   < > 8.4*   < > 8.0*   < > 7.6*  --  7.7*  --  7.9*  --  7.9*  --   --  8.0*  MG 2.5*  --  2.0  --  2.2  --  1.9  --   --   --  2.2  --   --   --   --   --   PHOS 3.2  --  2.7  --  2.3*  --  2.7  --   --   --  2.2*  --   --   --   --   --    < > = values in this interval not displayed.   Recent Labs  Lab 08/17/20 0338 08/17/20 0338 08/18/20 0254 08/18/20 1550 08/19/20 0500  HGB 8.8*   < > 8.8* 8.5* 8.0*  HCT 26.4*   < > 26.6* 25.0* 24.6*  WBC 10.4  --  12.3*  --  15.5*  PLT  158  --  208  --  250   < > = values in this interval not displayed.     Resolved Hospital Problem list   Hypernatremia  Anasarca   Assessment & Plan:   Acute Metabolic Encephalopathy, Persistent hypoglycemia, new ischemic infarcts 9/26 Baseline able to speak but blind and hard of hearing, functionally quadriplegic. Seizures ruled out by repeated EEGs.  Possible component of cefepime related encephalopathy- cefepime stopped 9/25.  Bilateral ACA strokes likely due to mass effect of patient's meningiomatosis.  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   -minimize sedation  -supportive care -concern he will have poor physical recovery / quality of life requiring tracheostomy, PEG and 24h nursing support  Acute Hypoxemic Respiratory Failure  Patient was on brigatinib as off-label trial which has 5% incidence of pneumonitis, but given his encephalopathy and imaging findings he is more aspiration pneumonitis and third spacing due to poor baseline nutritional status.  Another possibility is progressive neurofibromas affecting secretion clearance and diaphragm strength.  Completed abx.  -PRVC 8cc/kg, lung protective ventilation  -wean PEEP / fiO2 for sats >90% -daily SBT / WUA -solumderol for possible pneumonitis (O2 needs improved within 48-72h of initiation), plan for 6 week taper -VAP prevention measures  -even fluid balance  -PJP prophylaxis while on steroids -pending family decisions, may need trach, PEG, LTACH  Hypernatremia, anasarca Hypokalemia Hypomagnesemia  -free water 100 ml Q6 -lasix 20 mg BID  -follow electrolytes, replace as indicated   Hypo / Hyperglycemia -SSI  -Goal glucose 140-180  Labile blood pressures Suspect autonomic instability related to heavy NF2 burden on spinal cord. -continue midodrine 2.5mg  BID  -MAP goal >65  R Pleural Effusion Suspect benign transudate from poor oncotic pressure but is neutrophil predominant, regardless seems to have  drained well -improved, follow intermittent CXR   Neurofibromatosis Type 2 Progressive, treatment currently on hold.  Followed at [facility]  -supportive care  Microcytic Hypochromic Anemia -transfuse for Hgb <7% -MVI   Prediabetic A1c 6.1 -SSI   Severe Protein Calorie Malnutrition  -TF per Nutrition  -additional prosource  Best practice:  Diet: NPO, TF per RD  Pain/Anxiety/Delirium protocol (if indicated): PRN fentanyl  VAP protocol (if indicated): per protocol DVT prophylaxis: lovenox GI prophylaxis: PPI Glucose control: q4h checks Mobility: BR Code Status: Full code  Family Communication: Sister updated at bedside 10/1.  Wife called for update 10/2, no answer, message left for return call.  Disposition: ICU  Critical care time:  32 minutes     [name], MSN, NP-C [facility] 08/19/2020, 9:59 AM   Please see [email or web address] for pager details.

## 2020-08-20 ENCOUNTER — Inpatient Hospital Stay (HOSPITAL_COMMUNITY): Payer: Medicare Other

## 2020-08-20 DIAGNOSIS — I639 Cerebral infarction, unspecified: Secondary | ICD-10-CM

## 2020-08-20 LAB — COMPREHENSIVE METABOLIC PANEL
ALT: 92 U/L — ABNORMAL HIGH (ref 0–44)
AST: 37 U/L (ref 15–41)
Albumin: 1.9 g/dL — ABNORMAL LOW (ref 3.5–5.0)
Alkaline Phosphatase: 80 U/L (ref 38–126)
Anion gap: 9 (ref 5–15)
BUN: 38 mg/dL — ABNORMAL HIGH (ref 6–20)
CO2: 26 mmol/L (ref 22–32)
Calcium: 8.3 mg/dL — ABNORMAL LOW (ref 8.9–10.3)
Chloride: 107 mmol/L (ref 98–111)
Creatinine, Ser: 0.41 mg/dL — ABNORMAL LOW (ref 0.61–1.24)
GFR calc Af Amer: >60 mL/min (ref >60)
GFR calc non Af Amer: >60 mL/min (ref >60)
Glucose, Bld: 137 mg/dL — ABNORMAL HIGH (ref 70–99)
Potassium: 4.3 mmol/L (ref 3.5–5.1)
Sodium: 142 mmol/L (ref 135–145)
Total Bilirubin: 0.6 mg/dL (ref 0.3–1.2)
Total Protein: 4.7 g/dL — ABNORMAL LOW (ref 6.5–8.1)

## 2020-08-20 LAB — GLUCOSE, CAPILLARY
Glucose-Capillary: 113 mg/dL — ABNORMAL HIGH (ref 70–99)
Glucose-Capillary: 136 mg/dL — ABNORMAL HIGH (ref 70–99)
Glucose-Capillary: 137 mg/dL — ABNORMAL HIGH (ref 70–99)
Glucose-Capillary: 141 mg/dL — ABNORMAL HIGH (ref 70–99)
Glucose-Capillary: 150 mg/dL — ABNORMAL HIGH (ref 70–99)
Glucose-Capillary: 164 mg/dL — ABNORMAL HIGH (ref 70–99)

## 2020-08-20 LAB — CBC
HCT: 25.2 % — ABNORMAL LOW (ref 39.0–52.0)
Hemoglobin: 8.2 g/dL — ABNORMAL LOW (ref 13.0–17.0)
MCH: 25.7 pg — ABNORMAL LOW (ref 26.0–34.0)
MCHC: 32.5 g/dL (ref 30.0–36.0)
MCV: 79 fL — ABNORMAL LOW (ref 80.0–100.0)
Platelets: 296 10*3/uL (ref 150–400)
RBC: 3.19 MIL/uL — ABNORMAL LOW (ref 4.22–5.81)
RDW: 17.4 % — ABNORMAL HIGH (ref 11.5–15.5)
WBC: 16 10*3/uL — ABNORMAL HIGH (ref 4.0–10.5)
nRBC: 0 % (ref 0.0–0.2)

## 2020-08-20 LAB — POCT I-STAT 7, (LYTES, BLD GAS, ICA,H+H)
Acid-Base Excess: 3 mmol/L — ABNORMAL HIGH (ref 0.0–2.0)
Bicarbonate: 27.8 mmol/L (ref 20.0–28.0)
Calcium, Ion: 1.19 mmol/L (ref 1.15–1.40)
HCT: 23 % — ABNORMAL LOW (ref 39.0–52.0)
Hemoglobin: 7.8 g/dL — ABNORMAL LOW (ref 13.0–17.0)
O2 Saturation: 95 %
Patient temperature: 96.8
Potassium: 4.5 mmol/L (ref 3.5–5.1)
Sodium: 140 mmol/L (ref 135–145)
TCO2: 29 mmol/L (ref 22–32)
pCO2 arterial: 39.4 mmHg (ref 32.0–48.0)
pH, Arterial: 7.453 — ABNORMAL HIGH (ref 7.350–7.450)
pO2, Arterial: 67 mmHg — ABNORMAL LOW (ref 83.0–108.0)

## 2020-08-20 MED ORDER — PROSOURCE TF PO LIQD
45.0000 mL | Freq: Two times a day (BID) | ORAL | Status: DC
  Administered 2020-08-20 – 2020-08-21 (×4): 45 mL
  Filled 2020-08-20 (×3): qty 45

## 2020-08-20 NOTE — Plan of Care (Signed)
  Problem: Education: Goal: Knowledge of General Education information will improve Description: Including pain rating scale, medication(s)/side effects and non-pharmacologic comfort measures Outcome: Progressing   Problem: Health Behavior/Discharge Planning: Goal: Ability to manage health-related needs will improve Outcome: Progressing   Problem: Clinical Measurements: Goal: Ability to maintain clinical measurements within normal limits will improve Outcome: Progressing Goal: Will remain free from infection Outcome: Progressing Goal: Diagnostic test results will improve Outcome: Progressing Goal: Respiratory complications will improve Outcome: Progressing Goal: Cardiovascular complication will be avoided Outcome: Progressing   Problem: Activity: Goal: Risk for activity intolerance will decrease Outcome: Progressing   Problem: Nutrition: Goal: Adequate nutrition will be maintained Outcome: Progressing   Problem: Coping: Goal: Level of anxiety will decrease Outcome: Progressing   Problem: Elimination: Goal: Will not experience complications related to bowel motility Outcome: Progressing Goal: Will not experience complications related to urinary retention Outcome: Progressing   Problem: Pain Managment: Goal: General experience of comfort will improve Outcome: Progressing   Problem: Safety: Goal: Ability to remain free from injury will improve Outcome: Progressing   Problem: Skin Integrity: Goal: Risk for impaired skin integrity will decrease Outcome: Progressing   Problem: Education: Goal: Expressions of having a comfortable level of knowledge regarding the disease process will increase Outcome: Progressing   Problem: Coping: Goal: Ability to adjust to condition or change in health will improve Outcome: Progressing Goal: Ability to identify appropriate support needs will improve Outcome: Progressing   Problem: Health Behavior/Discharge Planning: Goal:  Compliance with prescribed medication regimen will improve Outcome: Progressing   Problem: Medication: Goal: Risk for medication side effects will decrease Outcome: Progressing   Problem: Clinical Measurements: Goal: Complications related to the disease process, condition or treatment will be avoided or minimized Outcome: Progressing Goal: Diagnostic test results will improve Outcome: Progressing   Problem: Safety: Goal: Verbalization of understanding the information provided will improve Outcome: Progressing   Problem: Self-Concept: Goal: Level of anxiety will decrease Outcome: Progressing Goal: Ability to verbalize feelings about condition will improve Outcome: Progressing   Problem: Education: Goal: Knowledge of disease or condition will improve Outcome: Progressing Goal: Knowledge of secondary prevention will improve Outcome: Progressing Goal: Knowledge of patient specific risk factors addressed and post discharge goals established will improve Outcome: Progressing Goal: Individualized Educational Video(s) Outcome: Progressing   Problem: Coping: Goal: Will verbalize positive feelings about self Outcome: Progressing Goal: Will identify appropriate support needs Outcome: Progressing   Problem: Health Behavior/Discharge Planning: Goal: Ability to manage health-related needs will improve Outcome: Progressing   Problem: Self-Care: Goal: Ability to participate in self-care as condition permits will improve Outcome: Progressing Goal: Verbalization of feelings and concerns over difficulty with self-care will improve Outcome: Progressing Goal: Ability to communicate needs accurately will improve Outcome: Progressing   Problem: Nutrition: Goal: Risk of aspiration will decrease Outcome: Progressing Goal: Dietary intake will improve Outcome: Progressing   Problem: Intracerebral Hemorrhage Tissue Perfusion: Goal: Complications of Intracerebral Hemorrhage will be  minimized Outcome: Progressing   Problem: Ischemic Stroke/TIA Tissue Perfusion: Goal: Complications of ischemic stroke/TIA will be minimized Outcome: Progressing   Problem: Spontaneous Subarachnoid Hemorrhage Tissue Perfusion: Goal: Complications of Spontaneous Subarachnoid Hemorrhage will be minimized Outcome: Progressing

## 2020-08-20 NOTE — Progress Notes (Addendum)
NAME:  MAKYLE ESLICK, [name]:  [id number], DOB:  1975/11/30, LOS: 12 ADMISSION DATE:  08/08/2020, CONSULTATION DATE:  08/12/20 REFERRING MD:  Opyd, CHIEF COMPLAINT:  unresponsive   Brief History   44 year old man with hx of type 2 neurofibromatosis, quadriplegia, blindness, hearing loss (baseline only able to talk) presenting with recurrent unresponsive episodes as well as generalized seizure activity.  Brought to [facility] for further management.  Unfortunately less responsive this AM with hypotension and hypoxemia so PCCM consulted.  Patient unable to provide any history.  Normally followed by [facility] but due to instability was brought into [place].  Past Medical History  Neurofibromatosis 2 Neurosurgical intervention March 2021 with postoperative course complicated by functional quadriplegia, blindness, hearing loss (baseline only able to talk)  Decubitus pressue ulcer  Significant Hospital Events   9/21 admitted to [facility] 9/25 ETT, PICC, arterial line, right chest tube 9/30 See progress note from Dr. Marcheta Grammes conversation with mother, patient exam 10/01 On vent, interactive with nodding yes/no   Consults:  Neurology  Procedures:  R PICC 9/24 >> ETT 9/25 >> L radial aline 9/25 >> out R pigtail CT 9/25 >> 9/30  Significant Diagnostic Tests:  CT Brain 9/21>> no acute infarct, extensive meningiomatosis.  Unchanged prominent vasogenic edema within the underlying mid-to-anterior frontal lobes and crossing the corpus callosum. As before, there is herniation of the rectus gyri into the sella Turcica.  EEG 9/21 >> no evidence of seizure CT Head 9/26 >> Motion degraded exam. Question an approximate 1.3 cm area of increased hypodensity involving the posterior limb of the right internal capsule. While this finding may be artifactual in nature given motion artifact on this exam, a possible evolving ischemic infarct is difficult to exclude, and could be considered in the correct clinical setting. MR  brain 9/26 >> Cortically based acute infarcts involving the bilateral frontal lobes most prominent in the ACA territory. Small superior vermian acute infarct. Continuous EEG 9/26 >> no evidence of seizures, EEG discontinued  CT Head 9/27 >> No significant interval change of multifocal evolving ischemic infarcts, better evaluated on recent brain MRI. No hemorrhagic transformation or other complication. Extensive underlying changes related to NF2, stable. No new intracranial abnormality. CTA head/ neck 9/27 >> Negative CTA for large vessel occlusion. No hemodynamically significant or correctable stenosis. The intracranial circulation is diffusely diminutive but remains patent. Multifocal opacities throughout the visualized lungs, concerning for multifocal infection/pneumonia. Large layering left pleural effusion, partially visualized.  Micro Data:  9/21 COVID >> neg 9/21 HIV >> negative 9/21 UC >> neg 9/21 BCx 2 >> neg 9/24 MRSA >> neg 9/25 R pleural fluid cx >> negative  9/25 R pleural fluid g/s >> negative 9/26 Tracheal aspirate >> few candida albicans  Antimicrobials:  Vanc 9/21 >> 9/24 Aztreonam 9/21 Flagyl 9/21 >> 9/25 Cefepime 9/21 >> 9/25 Ceftriaxone 9/26 >> 9/29  Interim history/subjective:  Afebrile / WBC 15.5 Remains on vent - 35%, 10 PEEP  Glucose range 120-150's I/O 2.2L UOP, -1.1L in last 24 hours  RN reports no acute events > patient bathed, sacral dressing changed Pt denies pain, SOB  Objective   Blood pressure 113/73, pulse 74, temperature (!) 97 F (36.1 C), resp. rate 12, height 5\' 10"  (1.778 m), weight 72.4 kg, SpO2 100 %.    Vent Mode: PRVC FiO2 (%):  [35 %] 35 % Set Rate:  [10 bmp] 10 bmp Vt Set:  [580 mL] 580 mL PEEP:  [5 cmH20] 5 cmH20 Plateau Pressure:  [10  cmH20-13 cmH20] 10 cmH20   Intake/Output Summary (Last 24 hours) at 08/20/2020 1015 Last data filed at 08/20/2020 1005 Gross per 24 hour  Intake 1193.11 ml  Output 4175 ml  Net -2981.89 ml    Filed Weights   08/08/20 1430 08/13/20 0500 08/14/20 0500  Weight: 65.3 kg 72.1 kg 72.4 kg    Examination: General: adult male lying in bed in NAD on vent   HEENT: MM pink/moist, ETT, eyes open, anicteric  Neuro: Awake, alert, nods yes/no to questions, more alert this am than 10/2 exam CV: s1s2 RRR, no m/r/g PULM: non-labored on vent, lungs bilaterally clear  GI: soft, bsx4 active  Extremities: warm/dry, trace generalized edema, muscle wasting  Skin: sacral dressing intact    Recent Labs  Lab 08/14/20 0455 08/14/20 0455 08/15/20 0413 08/15/20 0413 08/16/20 0411 08/16/20 0411 08/16/20 1531 08/16/20 1531 08/17/20 0338 08/17/20 0338 08/18/20 0254 08/18/20 0254 08/18/20 1550 08/18/20 1550 08/19/20 0500 08/20/20 0225  NA 147*   < > 146*   < > 140   < > 140   < > 139  --  140  --  142  --  141 142  K 4.1   < > 3.2*   < > 3.1*   < > 4.3   < > 3.6   < > 3.8   < > 3.5   < > 4.0 4.3  CL 120*   < > 117*   < > 109   < > 110  --  106  --  107  --   --   --  105 107  CO2 15*   < > 19*   < > 19*   < > 22  --  24  --  25  --   --   --  25 26  GLUCOSE 109*   < > 161*   < > 169*   < > 174*  --  154*  --  147*  --   --   --  153* 137*  BUN 21*   < > 29*   < > 32*   < > 33*  --  35*  --  38*  --   --   --  41* 38*  CREATININE 0.67   < > 0.64   < > 0.63   < > 0.49*  --  0.49*  --  0.42*  --   --   --  0.42* 0.41*  CALCIUM 8.4*   < > 8.0*   < > 7.6*   < > 7.7*  --  7.9*  --  7.9*  --   --   --  8.0* 8.3*  MG 2.0  --  2.2  --  1.9  --   --   --  2.2  --   --   --   --   --   --   --   PHOS 2.7  --  2.3*  --  2.7  --   --   --  2.2*  --   --   --   --   --   --   --    < > = values in this interval not displayed.   Recent Labs  Lab 08/18/20 0254 08/18/20 0254 08/18/20 1550 08/19/20 0500 08/20/20 0225  HGB 8.8*   < > 8.5* 8.0* 8.2*  HCT 26.6*   < > 25.0* 24.6* 25.2*  WBC 12.3*  --   --  15.5* 16.0*  PLT 208  --   --  250 296   < > = values in this interval not displayed.      Resolved Hospital Problem list   Hypernatremia  Anasarca   Assessment & Plan:   Acute Metabolic Encephalopathy, Persistent hypoglycemia, new ischemic infarcts 9/26 Baseline able to speak but blind and hard of hearing, functionally quadriplegic. Seizures ruled out by repeated EEGs.  Possible component of cefepime related encephalopathy- cefepime stopped 9/25.  Bilateral ACA strokes likely due to mass effect of patient's meningiomatosis.  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   -minimize sedating medications -PRN fentanyl for pain, PRN ativan for seizure -supportive care   Acute Hypoxemic Respiratory Failure  Suspected Pneumonitis  Patient was on brigatinib as off-label trial which has 5% incidence of pneumonitis, but given his encephalopathy and imaging findings he is more aspiration pneumonitis and third spacing due to poor baseline nutritional status.  Another possibility is progressive neurofibromas affecting secretion clearance and diaphragm strength.  Completed abx.  -PRVC 8cc/kg  -wean PEEP / fiO2 for sats > 90% -daily SBT / WUA  -wean PEEP / fiO2 for sats >90% -solumedrol for pneumonitis (O2 needs improved 48-72 hours post initiation), plan for 6 week taper  -VAP prevention measures -PJP prophylaxis while on steroids  -anticipate he will need trach / PEG, LTACH.    Hypernatremia, anasarca Hypokalemia Hypomagnesemia  -free water 100 ml Q6 -follow electrolytes, replace as indicated -completed series of 6 doses lasix 10/3, daily assessment for diuresis needs  Hypo / Hyperglycemia -SSI  -glucose goal 140-180   Labile blood pressures Suspect autonomic instability related to heavy NF2 burden on spinal cord. -midodrine 2.5 mg BID -MAP goal >65   R Pleural Effusion Suspect benign transudate from poor oncotic pressure but is neutrophil predominant, regardless seems to have drained well -improved as of 10/2 assessment, follow intermittent CXR    Neurofibromatosis Type 2 Progressive, treatment currently on hold.  Followed at [facility] by Dr. Clovis Riley.  -supportive care  Microcytic Hypochromic Anemia -MVI  -follow CBC -transfuse for Hgb <7%  Prediabetic A1c 6.1 -SSI  Sacral Decubitus -wound care per nursing   Severe Protein Calorie Malnutrition  -TF per Nutrition  -prosource supplement   Best practice:  Diet: NPO, TF  Pain/Anxiety/Delirium protocol (if indicated): PRN fentanyl  VAP protocol (if indicated): per protocol DVT prophylaxis: lovenox GI prophylaxis: PPI Glucose control: q4h checks Mobility: BR Code Status: Full code  Family Communication:  Mother updated 10/3 at bedside.  Suspect it may be most helpful to coordinate a family meeting in the evening so his wife can attend and his mother to make future plans for [name] (ie: trach discussion and change of status that would likely require living in a facility).   Disposition: ICU  Critical care time:  32 minutes     [name], MSN, NP-C [facility] 08/20/2020, 10:15 AM   Please see [email or web address] for pager details.

## 2020-08-20 NOTE — Progress Notes (Addendum)
Dr. Carlis Abbott placed pt in CPAP/PS to see how pt maintains. She stated that she would like a gas in one hour. Per [facility] Dr. Carlis Abbott stated to leave pt in CPAP/PS until 1830

## 2020-08-21 LAB — COMPREHENSIVE METABOLIC PANEL
ALT: 93 U/L — ABNORMAL HIGH (ref 0–44)
AST: 34 U/L (ref 15–41)
Albumin: 1.9 g/dL — ABNORMAL LOW (ref 3.5–5.0)
Alkaline Phosphatase: 72 U/L (ref 38–126)
Anion gap: 6 (ref 5–15)
BUN: 42 mg/dL — ABNORMAL HIGH (ref 6–20)
CO2: 27 mmol/L (ref 22–32)
Calcium: 8.3 mg/dL — ABNORMAL LOW (ref 8.9–10.3)
Chloride: 109 mmol/L (ref 98–111)
Creatinine, Ser: 0.34 mg/dL — ABNORMAL LOW (ref 0.61–1.24)
GFR calc Af Amer: >60 mL/min (ref >60)
GFR calc non Af Amer: >60 mL/min (ref >60)
Glucose, Bld: 149 mg/dL — ABNORMAL HIGH (ref 70–99)
Potassium: 3.7 mmol/L (ref 3.5–5.1)
Sodium: 142 mmol/L (ref 135–145)
Total Bilirubin: 0.4 mg/dL (ref 0.3–1.2)
Total Protein: 4.6 g/dL — ABNORMAL LOW (ref 6.5–8.1)

## 2020-08-21 LAB — URINALYSIS, ROUTINE W REFLEX MICROSCOPIC
Bilirubin Urine: NEGATIVE
Glucose, UA: NEGATIVE mg/dL
Hgb urine dipstick: NEGATIVE
Ketones, ur: NEGATIVE mg/dL
Leukocytes,Ua: NEGATIVE
Nitrite: NEGATIVE
Protein, ur: NEGATIVE mg/dL
Specific Gravity, Urine: 1.017 (ref 1.005–1.030)
pH: 6 (ref 5.0–8.0)

## 2020-08-21 LAB — CBC
HCT: 23.9 % — ABNORMAL LOW (ref 39.0–52.0)
Hemoglobin: 7.9 g/dL — ABNORMAL LOW (ref 13.0–17.0)
MCH: 26.4 pg (ref 26.0–34.0)
MCHC: 33.1 g/dL (ref 30.0–36.0)
MCV: 79.9 fL — ABNORMAL LOW (ref 80.0–100.0)
Platelets: 340 10*3/uL (ref 150–400)
RBC: 2.99 MIL/uL — ABNORMAL LOW (ref 4.22–5.81)
RDW: 17.8 % — ABNORMAL HIGH (ref 11.5–15.5)
WBC: 14.6 10*3/uL — ABNORMAL HIGH (ref 4.0–10.5)
nRBC: 0 % (ref 0.0–0.2)

## 2020-08-21 LAB — GLUCOSE, CAPILLARY
Glucose-Capillary: 150 mg/dL — ABNORMAL HIGH (ref 70–99)
Glucose-Capillary: 143 mg/dL — ABNORMAL HIGH (ref 70–99)
Glucose-Capillary: 140 mg/dL — ABNORMAL HIGH (ref 70–99)
Glucose-Capillary: 116 mg/dL — ABNORMAL HIGH (ref 70–99)
Glucose-Capillary: 121 mg/dL — ABNORMAL HIGH (ref 70–99)
Glucose-Capillary: 98 mg/dL (ref 70–99)

## 2020-08-21 MED ORDER — SCOPOLAMINE 1 MG/3DAYS TD PT72
1.0000 | MEDICATED_PATCH | TRANSDERMAL | Status: DC
  Administered 2020-08-21: 1.5 mg via TRANSDERMAL
  Filled 2020-08-21: qty 1

## 2020-08-21 MED ORDER — CHLORHEXIDINE GLUCONATE 0.12 % MT SOLN
15.0000 mL | Freq: Two times a day (BID) | OROMUCOSAL | Status: DC
  Administered 2020-08-21 – 2020-09-07 (×34): 15 mL via OROMUCOSAL
  Filled 2020-08-21 (×33): qty 15

## 2020-08-21 MED ORDER — FREE WATER
100.0000 mL | Freq: Three times a day (TID) | Status: DC
  Administered 2020-08-21: 100 mL

## 2020-08-21 MED ORDER — LEVETIRACETAM IN NACL 1500 MG/100ML IV SOLN
1500.0000 mg | Freq: Two times a day (BID) | INTRAVENOUS | Status: DC
  Administered 2020-08-21 – 2020-09-07 (×34): 1500 mg via INTRAVENOUS
  Filled 2020-08-21 (×35): qty 100

## 2020-08-21 MED ORDER — PANTOPRAZOLE SODIUM 40 MG IV SOLR
40.0000 mg | Freq: Every day | INTRAVENOUS | Status: DC
  Administered 2020-08-21 – 2020-08-23 (×3): 40 mg via INTRAVENOUS
  Filled 2020-08-21 (×3): qty 40

## 2020-08-21 NOTE — Progress Notes (Signed)
Dr. Carlis Abbott placed pt in CPAP/PS. Pt will remain in this mode for the remainder of the day or until he desats.

## 2020-08-21 NOTE — Progress Notes (Signed)
NAME:  Cameron Hale, [name]:  [id number], DOB:  Sep 11, 1976, LOS: 45 ADMISSION DATE:  08/08/2020, CONSULTATION DATE:  08/12/20 REFERRING MD:  Opyd, CHIEF COMPLAINT:  unresponsive   Brief History   44 year old man with hx of type 2 neurofibromatosis, quadriplegia, blindness, hearing loss (baseline only able to talk) presenting with recurrent unresponsive episodes as well as generalized seizure activity.  Brought to [facility] for further management.  Unfortunately less responsive this AM with hypotension and hypoxemia so PCCM consulted.  Patient unable to provide any history.  Normally followed by [facility] but due to instability was brought into [place].  Past Medical History  Neurofibromatosis 2 Neurosurgical intervention March 2021 with postoperative course complicated by functional quadriplegia, blindness, hearing loss (baseline only able to talk)  Decubitus pressue ulcer  Significant Hospital Events   9/21 admitted to [facility] 9/25 ETT, PICC, arterial line, right chest tube 9/30 See progress note from Dr. Marcheta Grammes conversation with mother, patient exam 10/01 On vent, interactive with nodding yes/no   Consults:  Neurology  Procedures:  R PICC 9/24 >> ETT 9/25 >> L radial aline 9/25 >> out R pigtail CT 9/25 >> 9/30  Significant Diagnostic Tests:  CT Brain 9/21>> no acute infarct, extensive meningiomatosis.  Unchanged prominent vasogenic edema within the underlying mid-to-anterior frontal lobes and crossing the corpus callosum. As before, there is herniation of the rectus gyri into the sella Turcica.  EEG 9/21 >> no evidence of seizure CT Head 9/26 >> Motion degraded exam. Question an approximate 1.3 cm area of increased hypodensity involving the posterior limb of the right internal capsule. While this finding may be artifactual in nature given motion artifact on this exam, a possible evolving ischemic infarct is difficult to exclude, and could be considered in the correct clinical setting. MR  brain 9/26 >> Cortically based acute infarcts involving the bilateral frontal lobes most prominent in the ACA territory. Small superior vermian acute infarct. Continuous EEG 9/26 >> no evidence of seizures, EEG discontinued  CT Head 9/27 >> No significant interval change of multifocal evolving ischemic infarcts, better evaluated on recent brain MRI. No hemorrhagic transformation or other complication. Extensive underlying changes related to NF2, stable. No new intracranial abnormality. CTA head/ neck 9/27 >> Negative CTA for large vessel occlusion. No hemodynamically significant or correctable stenosis. The intracranial circulation is diffusely diminutive but remains patent. Multifocal opacities throughout the visualized lungs, concerning for multifocal infection/pneumonia. Large layering left pleural effusion, partially visualized.  Micro Data:  9/21 COVID >> neg 9/21 HIV >> negative 9/21 UC >> neg 9/21 BCx 2 >> neg 9/24 MRSA >> neg 9/25 R pleural fluid cx >> negative  9/25 R pleural fluid g/s >> negative 9/26 Tracheal aspirate >> few candida albicans  Antimicrobials:  Vanc 9/21 >> 9/24 Aztreonam 9/21 Flagyl 9/21 >> 9/25 Cefepime 9/21 >> 9/25 Ceftriaxone 9/26 >> 9/29 Bactrim 10/1 >>   Interim history/subjective:  NAEO Patient is nodding/shaking head/shrugging to questions Denies pain Mother asks patient "you want this tube out of your mouth, don't you?" to which patient nods.  Mother is at bedside and is very specific about wanting to discuss trach with Dr. Tamala Julian CCM   Objective   Blood pressure 105/72, pulse 74, temperature (!) 96.3 F (35.7 C), temperature source Axillary, resp. rate 18, height 5\' 10"  (1.778 m), weight 72.4 kg, SpO2 100 %.    Vent Mode: PSV;CPAP FiO2 (%):  [35 %] 35 % Set Rate:  [10 bmp] 10 bmp Vt Set:  [664  mL] 580 mL PEEP:  [5 cmH20] 5 cmH20 Pressure Support:  [5 cmH20-7 cmH20] 5 cmH20 Plateau Pressure:  [6 cmH20-14 cmH20] 6 cmH20   Intake/Output  Summary (Last 24 hours) at 08/21/2020 1049 Last data filed at 08/21/2020 0800 Gross per 24 hour  Intake 3370.13 ml  Output 850 ml  Net 2520.13 ml   Filed Weights   08/13/20 0500 08/14/20 0500 08/21/20 0500  Weight: 72.1 kg 72.4 kg 72.4 kg    Examination: General: Chronically ill, debilitated appearing middle aged M, intubated without sedation NAD  HEENT: Pink mmm ETT secure OGT secure Trachea midline  Neuro: Awake, alert. Answering yes/no  Questions with nodding/shaking head. Lifts RUE spontaneously (very weak)  CV: rrr s1s2 no rgm cap refill < 3 seconds PULM: Symmetrical chest expansion. No accessory muscle use on PSV/CPAP. Vt > 450. CTAb GI: Soft flat ndnt + bowel sounds  Extremities: Symmetrical muscle wasting. No obvious large joint deformity. No cyanosis or clubbing  Skin: pale, c/d. Sacral dressing in place    Recent Labs  Lab 08/15/20 0413 08/15/20 0413 08/16/20 0411 08/16/20 1531 08/17/20 4259 08/17/20 5638 08/18/20 0254 08/18/20 0254 08/18/20 1550 08/18/20 1550 08/19/20 0500 08/19/20 0500 08/20/20 0225 08/20/20 0225 08/20/20 1553 08/21/20 0445  NA 146*   < > 140   < > 139   < > 140   < > 142  --  141  --  142  --  140 142  K 3.2*   < > 3.1*   < > 3.6   < > 3.8   < > 3.5   < > 4.0   < > 4.3   < > 4.5 3.7  CL 117*   < > 109   < > 106  --  107  --   --   --  105  --  107  --   --  109  CO2 19*   < > 19*   < > 24  --  25  --   --   --  25  --  26  --   --  27  GLUCOSE 161*   < > 169*   < > 154*  --  147*  --   --   --  153*  --  137*  --   --  149*  BUN 29*   < > 32*   < > 35*  --  38*  --   --   --  41*  --  38*  --   --  42*  CREATININE 0.64   < > 0.63   < > 0.49*  --  0.42*  --   --   --  0.42*  --  0.41*  --   --  0.34*  CALCIUM 8.0*   < > 7.6*   < > 7.9*  --  7.9*  --   --   --  8.0*  --  8.3*  --   --  8.3*  MG 2.2  --  1.9  --  2.2  --   --   --   --   --   --   --   --   --   --   --   PHOS 2.3*  --  2.7  --  2.2*  --   --   --   --   --   --   --   --    --   --   --    < > =  values in this interval not displayed.   Recent Labs  Lab 08/19/20 0500 08/19/20 0500 08/20/20 0225 08/20/20 1553 08/21/20 0445  HGB 8.0*   < > 8.2* 7.8* 7.9*  HCT 24.6*   < > 25.2* 23.0* 23.9*  WBC 15.5*  --  16.0*  --  14.6*  PLT 250  --  296  --  340   < > = values in this interval not displayed.     Resolved Hospital Problem list   Hypernatremia  Anasarca   Assessment & Plan:   Acute Metabolic Encephalopathy, Persistent hypoglycemia, new ischemic infarcts 9/26 Baseline able to speak but blind and hard of hearing, functionally quadriplegic. Seizures ruled out by repeated EEGs.  Possible component of cefepime related encephalopathy- cefepime stopped 9/25.  Bilateral ACA strokes likely due to mass effect of patient's meningiomatosis.  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   P -delirium precautions, off continuous sedation  -PRN fentanyl for pain, PRN ativan for seizure -supportive care   Acute Hypoxemic Respiratory Failure  Suspected Pneumonitis  Patient was on brigatinib as off-label trial which has 5% incidence of pneumonitis, but given his encephalopathy and imaging findings he is more aspiration pneumonitis and third spacing due to poor baseline nutritional status.  Another possibility is progressive neurofibromas affecting secretion clearance and diaphragm strength.  R Pleural Effusion, improved  Suspect benign transudate from poor oncotic pressure but is neutrophil predominant, regardless seems to have drained well P -Will cont PSV/CPAP 10/4 as tolerated  -Family would like to discuss trach  -suspect would need trach/peg/ltach  -solumedrol for pneumonitis-- plan for 6 week taper  -PJP prophylaxis while on steroids  -VAP  Hypernatremia -- improved  anasarca Hypokalemia Hypomagnesemia  -free water 100 ml  q8 hr (decr to q8 from q6 on 10/4) -follow electrolytes, replace as indicated  Hypo / Hyperglycemia -SSI  -glucose goal  140-180   Labile blood pressures Suspect autonomic instability related to heavy NF2 burden on spinal cord. -midodrine 2.5 mg BID -MAP goal >65    Neurofibromatosis Type 2 Progressive, treatment (brigatinib) currently on hold.  Followed at [facility] by Dr. Clovis Riley.  Seizure disorder  -supportive care -Keppra (home med)  Microcytic Hypochromic Anemia -MVI  -follow CBC -transfuse for Hgb <7%  Prediabetic A1c 6.1 -SSI  Sacral Decubitus -wound care per nursing   Severe Protein Calorie Malnutrition  -TF per Nutrition  - prosource supplement   Best practice:  Diet: EN Pain/Anxiety/Delirium protocol (if indicated): PRN fentanyl  VAP protocol (if indicated): per protocol DVT prophylaxis: lovenox GI prophylaxis: PPI Glucose control: q4h checks Mobility: BR Code Status: Full code  Family Communication:  Mother and pt updated at bedside. Mother adamant about wanting to discuss tracheostomy with Dr. Tamala Julian PCCM specifically. Disposition: ICU  CRITICAL CARE Performed by: Cristal Generous  Total critical care time: 38 minutes  Critical care time was exclusive of separately billable procedures and treating other patients. Critical care was necessary to treat or prevent imminent or life-threatening deterioration.  Critical care was time spent personally by me on the following activities: development of treatment plan with patient and/or surrogate as well as nursing, discussions with consultants, evaluation of patient's response to treatment, examination of patient, obtaining history from patient or surrogate, ordering and performing treatments and interventions, ordering and review of laboratory studies, ordering and review of radiographic studies, pulse oximetry and re-evaluation of patient's condition.   Eliseo Gum MSN, AGACNP-BC [place] [phone] If no answer, [phone] 08/21/2020,  11:20 AM

## 2020-08-21 NOTE — Progress Notes (Signed)
Palliative Care  Patient has been seen during his admission by Wadie Lessen NP for palliative care support. I discussed the patient's case and current plan with CCM NP Eliseo Gum. At this time patient has improved and has been extubated- he is participating in his care decisions with his family. At this time we will sign off. If there is need for ongoing palliative care support or his condition changes please re-consult [place] or call with any questions.  Lane Hacker, DO Palliative Medicine

## 2020-08-21 NOTE — Progress Notes (Signed)
eLink Physician-Brief Progress Note Patient Name: Cameron Hale DOB: May 17, 1976 [name]: [id number]   Date of Service  08/21/2020  HPI/Events of Note  foley D/C yesterday, no urine since foley D/C at 1800, bladder scan > 400  eICU Interventions  I/O cath x 1     Intervention Category Major Interventions: Other:  Margaretmary Lombard 08/21/2020, 1:08 AM

## 2020-08-21 NOTE — Progress Notes (Signed)
Straight cath in and out done under aseptic technique, with UO =400 ml, clear yellow urine, activities well tolerated, v/s stable

## 2020-08-21 NOTE — Progress Notes (Addendum)
PCCM Interval Note, follow up  Seen at bedside post-extubation Voice is weak but audible (this is reportedly baseline) Currently doing well, but given baseline state pt is always somewhat at-risk for resp failure.  We discussed possible re-intubation if resp status were to decline -- he is agreeable to this We discussed prospect of tracheostomy if he were to require re-intubation -- he is agreeable to this.  _____________________________ PCCM Interval Note  Seen in follow up at bedside. Has been tolerating PSV/CPAP with adequate Vt, RR -- some brief periods of apnea.  D/w Dr. Carlis Abbott and Dr. Tamala Julian PCCM (at bedside, discussing w family)  Reasonable to trial extubation.If does not tolerate, re-intubate and revisit possible tracheostomy.   Explained to pt who is able to nod/shake head to communicate. Explained to mother at bedside. Dr. Tamala Julian d/w additional family via phone.  Plan is well understood, all questions answered.     Eliseo Gum MSN, AGACNP-BC [place] [phone] 08/21/2020, 1:05 PM

## 2020-08-21 NOTE — Progress Notes (Signed)
Referred pt to DR on duty, no UO from the [name] was removed at 1800H, bladder scan was done by NT Melissa with yrine retention at 407 ml, with order to do in and out cath for 1 time

## 2020-08-21 NOTE — Plan of Care (Signed)
  Problem: Education: Goal: Knowledge of General Education information will improve Description: Including pain rating scale, medication(s)/side effects and non-pharmacologic comfort measures Outcome: Progressing   Problem: Health Behavior/Discharge Planning: Goal: Ability to manage health-related needs will improve Outcome: Progressing   Problem: Clinical Measurements: Goal: Ability to maintain clinical measurements within normal limits will improve Outcome: Progressing Goal: Will remain free from infection Outcome: Progressing Goal: Diagnostic test results will improve Outcome: Progressing Goal: Respiratory complications will improve Outcome: Progressing Goal: Cardiovascular complication will be avoided Outcome: Progressing   Problem: Activity: Goal: Risk for activity intolerance will decrease Outcome: Progressing   Problem: Nutrition: Goal: Adequate nutrition will be maintained Outcome: Progressing   Problem: Coping: Goal: Level of anxiety will decrease Outcome: Progressing   Problem: Elimination: Goal: Will not experience complications related to bowel motility Outcome: Progressing Goal: Will not experience complications related to urinary retention Outcome: Progressing   Problem: Pain Managment: Goal: General experience of comfort will improve Outcome: Progressing   Problem: Safety: Goal: Ability to remain free from injury will improve Outcome: Progressing   Problem: Skin Integrity: Goal: Risk for impaired skin integrity will decrease Outcome: Progressing   Problem: Education: Goal: Expressions of having a comfortable level of knowledge regarding the disease process will increase Outcome: Progressing   Problem: Coping: Goal: Ability to adjust to condition or change in health will improve Outcome: Progressing Goal: Ability to identify appropriate support needs will improve Outcome: Progressing   Problem: Health Behavior/Discharge Planning: Goal:  Compliance with prescribed medication regimen will improve Outcome: Progressing   Problem: Medication: Goal: Risk for medication side effects will decrease Outcome: Progressing   Problem: Clinical Measurements: Goal: Complications related to the disease process, condition or treatment will be avoided or minimized Outcome: Progressing Goal: Diagnostic test results will improve Outcome: Progressing   Problem: Safety: Goal: Verbalization of understanding the information provided will improve Outcome: Progressing   Problem: Self-Concept: Goal: Level of anxiety will decrease Outcome: Progressing Goal: Ability to verbalize feelings about condition will improve Outcome: Progressing   Problem: Education: Goal: Knowledge of disease or condition will improve Outcome: Progressing Goal: Knowledge of secondary prevention will improve Outcome: Progressing Goal: Knowledge of patient specific risk factors addressed and post discharge goals established will improve Outcome: Progressing Goal: Individualized Educational Video(s) Outcome: Progressing   Problem: Coping: Goal: Will verbalize positive feelings about self Outcome: Progressing Goal: Will identify appropriate support needs Outcome: Progressing   Problem: Health Behavior/Discharge Planning: Goal: Ability to manage health-related needs will improve Outcome: Progressing   Problem: Self-Care: Goal: Ability to participate in self-care as condition permits will improve Outcome: Progressing Goal: Verbalization of feelings and concerns over difficulty with self-care will improve Outcome: Progressing Goal: Ability to communicate needs accurately will improve Outcome: Progressing   Problem: Nutrition: Goal: Risk of aspiration will decrease Outcome: Progressing Goal: Dietary intake will improve Outcome: Progressing   Problem: Intracerebral Hemorrhage Tissue Perfusion: Goal: Complications of Intracerebral Hemorrhage will be  minimized Outcome: Progressing   Problem: Ischemic Stroke/TIA Tissue Perfusion: Goal: Complications of ischemic stroke/TIA will be minimized Outcome: Progressing   Problem: Spontaneous Subarachnoid Hemorrhage Tissue Perfusion: Goal: Complications of Spontaneous Subarachnoid Hemorrhage will be minimized Outcome: Progressing

## 2020-08-21 NOTE — Progress Notes (Signed)
RT found pt's ETT at 19cm losing volumes with diminished breath sounds on the left. RT advanced ETT to 22 at the lip. Good volumes now along with bilateral breath sounds.

## 2020-08-21 NOTE — Procedures (Signed)
Extubation Procedure Note  Patient Details:   Name: Cameron Hale DOB: 1976/10/29 [name]: [id number]   Airway Documentation:    Vent end date: 08/21/20 Vent end time: 1313   Evaluation  O2 sats: stable throughout Complications: No apparent complications Patient did tolerate procedure well. Bilateral Breath Sounds: Clear, Diminished   Yes   Prior to extubation, pt did have a positive cuff leak. Pt was extubated to [facility]. No stridor noted. Pt is able to node his head and mouth words. RT will continue to monitor pt.  Merick Kelleher R 08/21/2020, 1:16 PM

## 2020-08-22 DIAGNOSIS — I63423 Cerebral infarction due to embolism of bilateral anterior cerebral arteries: Secondary | ICD-10-CM

## 2020-08-22 DIAGNOSIS — J9601 Acute respiratory failure with hypoxia: Secondary | ICD-10-CM | POA: Diagnosis not present

## 2020-08-22 DIAGNOSIS — R569 Unspecified convulsions: Secondary | ICD-10-CM | POA: Diagnosis not present

## 2020-08-22 LAB — CBC
HCT: 24.4 % — ABNORMAL LOW (ref 39.0–52.0)
Hemoglobin: 7.8 g/dL — ABNORMAL LOW (ref 13.0–17.0)
MCH: 25.8 pg — ABNORMAL LOW (ref 26.0–34.0)
MCHC: 32 g/dL (ref 30.0–36.0)
MCV: 80.8 fL (ref 80.0–100.0)
Platelets: 372 10*3/uL (ref 150–400)
RBC: 3.02 MIL/uL — ABNORMAL LOW (ref 4.22–5.81)
RDW: 17.9 % — ABNORMAL HIGH (ref 11.5–15.5)
WBC: 15 10*3/uL — ABNORMAL HIGH (ref 4.0–10.5)
nRBC: 0 % (ref 0.0–0.2)

## 2020-08-22 LAB — URINE CULTURE
Culture: NO GROWTH
Special Requests: NORMAL

## 2020-08-22 LAB — POCT I-STAT 7, (LYTES, BLD GAS, ICA,H+H)
Acid-Base Excess: 1 mmol/L (ref 0.0–2.0)
Bicarbonate: 26.3 mmol/L (ref 20.0–28.0)
Calcium, Ion: 1.32 mmol/L (ref 1.15–1.40)
HCT: 22 % — ABNORMAL LOW (ref 39.0–52.0)
Hemoglobin: 7.5 g/dL — ABNORMAL LOW (ref 13.0–17.0)
O2 Saturation: 100 %
Potassium: 4 mmol/L (ref 3.5–5.1)
Sodium: 143 mmol/L (ref 135–145)
TCO2: 28 mmol/L (ref 22–32)
pCO2 arterial: 44.2 mmHg (ref 32.0–48.0)
pH, Arterial: 7.383 (ref 7.350–7.450)
pO2, Arterial: 201 mmHg — ABNORMAL HIGH (ref 83.0–108.0)

## 2020-08-22 LAB — COMPREHENSIVE METABOLIC PANEL
ALT: 91 U/L — ABNORMAL HIGH (ref 0–44)
AST: 27 U/L (ref 15–41)
Albumin: 2 g/dL — ABNORMAL LOW (ref 3.5–5.0)
Alkaline Phosphatase: 69 U/L (ref 38–126)
Anion gap: 9 (ref 5–15)
BUN: 42 mg/dL — ABNORMAL HIGH (ref 6–20)
CO2: 24 mmol/L (ref 22–32)
Calcium: 8.4 mg/dL — ABNORMAL LOW (ref 8.9–10.3)
Chloride: 111 mmol/L (ref 98–111)
Creatinine, Ser: <0.3 mg/dL — ABNORMAL LOW (ref 0.61–1.24)
Glucose, Bld: 84 mg/dL (ref 70–99)
Potassium: 4.4 mmol/L (ref 3.5–5.1)
Sodium: 144 mmol/L (ref 135–145)
Total Bilirubin: 0.4 mg/dL (ref 0.3–1.2)
Total Protein: 4.7 g/dL — ABNORMAL LOW (ref 6.5–8.1)

## 2020-08-22 LAB — GLUCOSE, CAPILLARY
Glucose-Capillary: 83 mg/dL (ref 70–99)
Glucose-Capillary: 91 mg/dL (ref 70–99)
Glucose-Capillary: 78 mg/dL (ref 70–99)
Glucose-Capillary: 71 mg/dL (ref 70–99)
Glucose-Capillary: 88 mg/dL (ref 70–99)
Glucose-Capillary: 135 mg/dL — ABNORMAL HIGH (ref 70–99)
Glucose-Capillary: 85 mg/dL (ref 70–99)

## 2020-08-22 MED ORDER — MIDAZOLAM HCL 2 MG/2ML IJ SOLN
INTRAMUSCULAR | Status: AC
  Filled 2020-08-22: qty 4

## 2020-08-22 MED ORDER — NOREPINEPHRINE 4 MG/250ML-% IV SOLN: 0.0000 ug/min | INTRAVENOUS | Status: DC

## 2020-08-22 MED ORDER — MIDODRINE HCL 5 MG PO TABS: 5.0000 mg | ORAL_TABLET | Freq: Two times a day (BID) | ORAL | Status: DC

## 2020-08-22 MED ORDER — HYDRALAZINE HCL 25 MG PO TABS: 25.0000 mg | ORAL_TABLET | Freq: Four times a day (QID) | ORAL | Status: DC | PRN

## 2020-08-22 MED ORDER — OSMOLITE 1.5 CAL PO LIQD
1000.0000 mL | ORAL | Status: DC
  Administered 2020-08-23 – 2020-09-05 (×12): 1000 mL
  Filled 2020-08-22 (×7): qty 1000

## 2020-08-22 MED ORDER — ROCURONIUM BROMIDE 10 MG/ML (PF) SYRINGE
PREFILLED_SYRINGE | INTRAVENOUS | Status: AC
  Filled 2020-08-22: qty 10

## 2020-08-22 MED ORDER — ETOMIDATE 2 MG/ML IV SOLN
INTRAVENOUS | Status: AC
  Filled 2020-08-22: qty 20

## 2020-08-22 MED ORDER — ONDANSETRON HCL 4 MG PO TABS: 4.0000 mg | ORAL_TABLET | Freq: Four times a day (QID) | ORAL | Status: DC | PRN

## 2020-08-22 MED ORDER — SULFAMETHOXAZOLE-TRIMETHOPRIM 800-160 MG PO TABS
1.0000 | ORAL_TABLET | ORAL | Status: DC
  Filled 2020-08-22: qty 1

## 2020-08-22 MED ORDER — ONDANSETRON HCL 4 MG/2ML IJ SOLN: 4.0000 mg | Freq: Four times a day (QID) | INTRAMUSCULAR | Status: DC | PRN

## 2020-08-22 MED ORDER — ASPIRIN 81 MG PO CHEW: 81.0000 mg | CHEWABLE_TABLET | Freq: Every day | ORAL | Status: DC

## 2020-08-22 MED ORDER — GLYCOPYRROLATE 0.2 MG/ML IJ SOLN
0.1000 mg | Freq: Three times a day (TID) | INTRAMUSCULAR | Status: DC
  Administered 2020-08-22 – 2020-09-08 (×50): 0.1 mg via INTRAVENOUS
  Filled 2020-08-22 (×50): qty 1

## 2020-08-22 MED ORDER — NOREPINEPHRINE 4 MG/250ML-% IV SOLN
INTRAVENOUS | Status: AC
  Administered 2020-08-22: 2 ug/min via INTRAVENOUS
  Filled 2020-08-22: qty 250

## 2020-08-22 MED ORDER — DEXTROSE-NACL 5-0.9 % IV SOLN: INTRAVENOUS | Status: DC

## 2020-08-22 MED ORDER — FENTANYL CITRATE (PF) 100 MCG/2ML IJ SOLN
INTRAMUSCULAR | Status: AC
  Filled 2020-08-22: qty 2

## 2020-08-22 MED ORDER — PROSOURCE TF PO LIQD
45.0000 mL | Freq: Three times a day (TID) | ORAL | Status: DC
  Administered 2020-08-23 – 2020-09-07 (×43): 45 mL
  Filled 2020-08-22 (×44): qty 45

## 2020-08-22 NOTE — Progress Notes (Signed)
Family communication  Unable to reach spouse.  Called pt mother to provide clinical updates. Discussed transfer to progressive from ICU, and likely need for cortrak.  Family concerned of transfer of care from PCCM to [facility], in that new providers may not understand pt's baseline status -- assurance and emotional support provided.   All questions answered.  Eliseo Gum MSN, AGACNP-BC [place] [phone] If no answer, [phone] 08/22/2020, 12:01 PM

## 2020-08-22 NOTE — Progress Notes (Signed)
NAME:  Cameron Hale, [name]:  [id number], DOB:  [birth year], LOS: 22 ADMISSION DATE:  08/08/2020, CONSULTATION DATE:  08/12/20 REFERRING MD:  Opyd, CHIEF COMPLAINT:  unresponsive   Brief History   44 year old man with hx of type 2 neurofibromatosis, quadriplegia, blindness, hearing loss (baseline only able to talk) presenting with recurrent unresponsive episodes as well as generalized seizure activity.  Brought to [facility] for further management.  Unfortunately less responsive this AM with hypotension and hypoxemia so PCCM consulted.  Patient unable to provide any history.  Normally followed by [facility] but due to instability was brought into [place].  Past Medical History  Neurofibromatosis 2 Neurosurgical intervention March 2021 with postoperative course complicated by functional quadriplegia, blindness, hearing loss (baseline only able to talk)  Decubitus pressue ulcer  Significant Hospital Events   9/21 admitted to [facility] 9/25 ETT, PICC, arterial line, right chest tube 9/30 See progress note from Dr. Marcheta Grammes conversation with mother, patient exam 10/01 On vent, interactive with nodding yes/no  10/4 extubated  10/5 remains stable. Off on [place]   Consults:  Neurology  Procedures:  R PICC 9/24 >> ETT 9/25 >9/4 L radial aline 9/25 >> out R pigtail CT 9/25 >> 9/30  Significant Diagnostic Tests:  CT Brain 9/21>> no acute infarct, extensive meningiomatosis.  Unchanged prominent vasogenic edema within the underlying mid-to-anterior frontal lobes and crossing the corpus callosum. As before, there is herniation of the rectus gyri into the sella Turcica.  EEG 9/21 >> no evidence of seizure CT Head 9/26 >> Motion degraded exam. Question an approximate 1.3 cm area of increased hypodensity involving the posterior limb of the right internal capsule. While this finding may be artifactual in nature given motion artifact on this exam, a possible evolving ischemic infarct is difficult to exclude, and could  be considered in the correct clinical setting. MR brain 9/26 >> Cortically based acute infarcts involving the bilateral frontal lobes most prominent in the ACA territory. Small superior vermian acute infarct. Continuous EEG 9/26 >> no evidence of seizures, EEG discontinued  CT Head 9/27 >> No significant interval change of multifocal evolving ischemic infarcts, better evaluated on recent brain MRI. No hemorrhagic transformation or other complication. Extensive underlying changes related to NF2, stable. No new intracranial abnormality. CTA head/ neck 9/27 >> Negative CTA for large vessel occlusion. No hemodynamically significant or correctable stenosis. The intracranial circulation is diffusely diminutive but remains patent. Multifocal opacities throughout the visualized lungs, concerning for multifocal infection/pneumonia. Large layering left pleural effusion, partially visualized.  Micro Data:  9/21 COVID >> neg 9/21 HIV >> negative 9/21 UC >> neg 9/21 BCx 2 >> neg 9/24 MRSA >> neg 9/25 R pleural fluid cx >> negative  9/25 R pleural fluid g/s >> negative 9/26 Tracheal aspirate >> few candida albicans  Antimicrobials:  Vanc 9/21 >> 9/24 Aztreonam 9/21 Flagyl 9/21 >> 9/25 Cefepime 9/21 >> 9/25 Ceftriaxone 9/26 >> 9/29 Bactrim 10/1 >>   Interim history/subjective:  Remains extubated  WBC, Hgb stable I have weaned off of [place] -- now is 99-100% on RA  Objective   Blood pressure 102/70, pulse 73, temperature (!) 96.8 F (36 C), temperature source Oral, resp. rate 16, height 5\' 10"  (1.778 m), weight 72.4 kg, SpO2 98 %.    Vent Mode: PCV;BIPAP FiO2 (%):  [35 %-40 %] 40 % Set Rate:  [10 bmp] 10 bmp PEEP:  [6 cmH20] 6 cmH20 Pressure Support:  [5 cmH20] 5 cmH20   Intake/Output Summary (Last 24 hours)  at 08/22/2020 1020 Last data filed at 08/22/2020 0900 Gross per 24 hour  Intake 330 ml  Output 1495 ml  Net -1165 ml   Filed Weights   08/13/20 0500 08/14/20 0500 08/21/20 0500    Weight: 72.1 kg 72.4 kg 72.4 kg    Examination: General: Chronically ill, debilitated appearing M, reclined in bed NAD  HEENT: Pink mmm. Anicteric sclera. Scopolamine patch behind R ear. Trachea midline  Neuro: Awake, alert. Answering yes/no  Questions with nodding/shaking head. Lifts RUE spontaneously (very weak)  CV: rrr s1s2 no rgm cap refill < 3 seconds PULM: scant scattered upper lobe rhonchi. Symmetrical chest expansion, even and unlabored respiration on RA  GI: Soft flat ndnt distant bowel sounds  Extremities: Symmetrical muscle wasting. No obvious joint deformity. No cyanosis or clubbing Skin: pale, clean, dry, cool to touch    Recent Labs  Lab 08/16/20 0411 08/16/20 1531 08/17/20 0338 08/17/20 0338 08/18/20 0254 08/18/20 1550 08/19/20 0500 08/19/20 0500 08/20/20 0225 08/20/20 0225 08/20/20 1553 08/20/20 1553 08/21/20 0445 08/22/20 0447  NA 140   < > 139   < > 140   < > 141  --  142  --  140  --  142 144  K 3.1*   < > 3.6   < > 3.8   < > 4.0   < > 4.3   < > 4.5   < > 3.7 4.4  CL 109   < > 106   < > 107  --  105  --  107  --   --   --  109 111  CO2 19*   < > 24   < > 25  --  25  --  26  --   --   --  27 24  GLUCOSE 169*   < > 154*   < > 147*  --  153*  --  137*  --   --   --  149* 84  BUN 32*   < > 35*   < > 38*  --  41*  --  38*  --   --   --  42* 42*  CREATININE 0.63   < > 0.49*   < > 0.42*  --  0.42*  --  0.41*  --   --   --  0.34* <0.30*  CALCIUM 7.6*   < > 7.9*   < > 7.9*  --  8.0*  --  8.3*  --   --   --  8.3* 8.4*  MG 1.9  --  2.2  --   --   --   --   --   --   --   --   --   --   --   PHOS 2.7  --  2.2*  --   --   --   --   --   --   --   --   --   --   --    < > = values in this interval not displayed.   Recent Labs  Lab 08/20/20 0225 08/20/20 0225 08/20/20 1553 08/21/20 0445 08/22/20 0447  HGB 8.2*   < > 7.8* 7.9* 7.8*  HCT 25.2*   < > 23.0* 23.9* 24.4*  WBC 16.0*  --   --  14.6* 15.0*  PLT 296  --   --  340 372   < > = values in this interval  not displayed.  Resolved Hospital Problem list   Hypernatremia  Anasarca  Persistent hypoglycemia  Assessment & Plan:   Acute Metabolic Encephalopathy, improving new ischemic infarcts 9/26 Baseline able to speak but blind in 1 eye and hard of hearing, functionally quadriplegic. Seizures ruled out by repeated EEGs.  Possible component of cefepime related encephalopathy- cefepime stopped 9/25.  Bilateral ACA strokes likely due to mass effect of patient's meningiomatosis.  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   P -delirium precautions -supportive care   Acute Hypoxemic Respiratory Failure, improved  Suspected Pneumonitis  Patient was on brigatinib as off-label trial which has 5% incidence of pneumonitis, but given his encephalopathy and imaging findings he is more aspiration pneumonitis and third spacing due to poor baseline nutritional status.  Another possibility is progressive neurofibromas affecting secretion clearance and diaphragm strength.  R Pleural Effusion, improved  Suspect benign transudate from poor oncotic pressure but is neutrophil predominant, regardless seems to have drained well  P -extubated 10/4, tolerating well. Has remained stable and is off of [place]  -noct BiPAP for apneas -solumedrol for pneumonitis-- plan for 6 week taper  -PJP prophylaxis while on steroids  -if requires reintubation, would proceed with trach   Hypernatremia -- improved  Hypokalemia Hypomagnesemia  -free water 100 ml  q8 hr (decr to q8 from q6 on 10/4) -follow electrolytes, replace as indicated  Hypo / Hyperglycemia -SSI  -glucose goal 140-180   Labile blood pressures-- improved Suspect autonomic instability related to heavy NF2 burden on spinal cord. P -midodrine 2.5 mg BID  Neurofibromatosis Type 2 Progressive, treatment (brigatinib) currently on hold.  Followed at [facility] by Dr. Clovis Riley.  Seizure disorder  -supportive care -Keppra (home med)  Microcytic Hypochromic  Anemia -MVI  -follow CBC -transfuse for Hgb <7%  Prediabetic A1c 6.1 -SSI  Sacral Decubitus -wound care per nursing   Severe Protein Calorie Malnutrition  -start D5NS 10/5. Dc this when able to either take POs or cortrak placed  -SLP following, following up with FEES -I am placing Cortrak consult (placement would be 10/6).  I am skeptical pt will be able to achieve adequate nutr on PO intake, and suspect he will need EN support -Appreciate RDN recs     Best practice:  Diet:NPO  Pain/Anxiety/Delirium protocol (if indicated): n/a VAP protocol (if indicated): na DVT prophylaxis: lovenox GI prophylaxis: PPI Glucose control: q4h checks Mobility: BR Code Status: Full code  Family Communication:  Discussed with patient and brother at bedside  Disposition: Transfer to progressive  Eliseo Gum MSN, AGACNP-BC [place] [phone] If no answer, [phone] 08/22/2020, 10:21 AM

## 2020-08-22 NOTE — Evaluation (Signed)
Occupational Therapy Evaluation and discharge from Acute OT Patient Details Name: Cameron Hale [name]: [id number] DOB: 09-11-1976 Today's Date: 08/22/2020    History of Present Illness 44 yo admitted 9/21 with unresponsiveness and seizures. Intubated 9/25-10/4. MRI with bilcerebral frontal infarcts. PMhx: neurofibromatosis with neurosurgical intervention march 2021 with resultant quadriplegia, seizure disorder,left eye blindness, hearing loss   Clinical Impression   Pt seen for re-evaulation post extubation. Pt on 4L O2 via [place] and VSS throughout session. Pt interactive throughout session knew it was "Tuesday" but also told therapy that he was getting himself in his power chair (family uses a lift/total assist). Pt is total A for ADL at baseline. Today with good ROM in all 4 extremities and noted movement at 2 in L forearm. However, Pt unable to push even a soft touch call bell despite multiple position attempts (RN aware) Pt max A +2 safety for rolling for rear peri care around rectal tube. OT to sign off acutely due to dependence at baseline, therapy encourages PROM in all 3 extremities. Pt with necessary DME at home for total lift and total assist with need for 24 hr care. Pt stated that he was getting [facility] therapy (unsure if this is true or not), if so, continue previous therapy regiment.    Follow Up Recommendations  No OT follow up;Supervision/Assistance - 24 hour (LTACH vs SNF)    Equipment Recommendations  None recommended by OT    Recommendations for Other Services       Precautions / Restrictions Precautions Precautions: Other (comment) Precaution Comments: quadriplegia      Mobility Bed Mobility Overal bed mobility: Needs Assistance Bed Mobility: Rolling Rolling: Max assist;+2 for safety/equipment         General bed mobility comments: rolling for rear peri care clean up around rectal tube, Pt with minimal ability to assist with rolling, max to total assist  Transfers                       Balance                                           ADL either performed or assessed with clinical judgement   ADL                                         General ADL Comments: total assist     Vision Baseline Vision/History: Legally blind (L eye) Additional Comments: dysconjugate gaze     Perception     Praxis      Pertinent Vitals/Pain Pain Assessment: Faces Faces Pain Scale: Hurts a little bit Pain Location: grimace with stretching/ROM to limbs Pain Descriptors / Indicators: Grimacing;Tightness Pain Intervention(s): Monitored during session;Repositioned;Other (comment) (ROM for stiffness)     Hand Dominance     Extremity/Trunk Assessment Upper Extremity Assessment Upper Extremity Assessment: RUE deficits/detail;LUE deficits/detail RUE Deficits / Details: full PROM, flat hand arches, joint changes in digits 3-5, no active movement RUE Coordination: decreased fine motor;decreased gross motor LUE Deficits / Details: tightness in shoulder, but able to achieve full PROM, flat hand arches, able to achieve elbow extension at 2+ LUE Coordination: decreased fine motor;decreased gross motor   Lower Extremity Assessment Lower Extremity Assessment: Defer to PT evaluation  Communication Communication Communication: HOH (per chart)   Cognition Arousal/Alertness: Awake/alert Behavior During Therapy: WFL for tasks assessed/performed Overall Cognitive Status: Impaired/Different from baseline Area of Impairment: Safety/judgement                         Safety/Judgement: Decreased awareness of deficits     General Comments: Pt with eyes open, joking a little, smiling, knew it was tuesday, but also shared that he could get in [facility] by himself PTA   General Comments  Pt on 4L O2 via [place] throughout session and VSS    Exercises     Shoulder Instructions      Home Living Family/patient expects to be  discharged to:: Private residence Living Arrangements: Parent;Other relatives Available Help at Discharge: Family;Friend(s);Available 24 hours/day Type of Home: House Home Access: Ramped entrance     Home Layout: One level     Bathroom Shower/Tub: Teacher, early years/pre: Standard     Home Equipment: Hospital bed;Other (comment);Wheelchair - power   Additional Comments: information gathered from previous chart entry      Prior Functioning/Environment Level of Independence: Needs assistance  Gait / Transfers Assistance Needed: max-total assist for bed mobility, hoyer lift to power WC ADL's / Homemaking Assistance Needed: dependent in ADL   Comments: no family present        OT Problem List:        OT Treatment/Interventions:      OT Goals(Current goals can be found in the care plan section) Acute Rehab OT Goals Patient Stated Goal: get back home OT Goal Formulation: With patient  OT Frequency:     Barriers to D/C:            Co-evaluation PT/OT/SLP Co-Evaluation/Treatment: Yes Reason for Co-Treatment: Complexity of the patient's impairments (multi-system involvement);Necessary to address cognition/behavior during functional activity;For patient/therapist safety PT goals addressed during session: Strengthening/ROM OT goals addressed during session: ADL's and self-care;Strengthening/ROM      AM-PAC OT "6 Clicks" Daily Activity     Outcome Measure Help from another person eating meals?: Total Help from another person taking care of personal grooming?: Total Help from another person toileting, which includes using toliet, bedpan, or urinal?: Total Help from another person bathing (including washing, rinsing, drying)?: Total Help from another person to put on and taking off regular upper body clothing?: Total Help from another person to put on and taking off regular lower body clothing?: Total 6 Click Score: 6   End of Session Equipment Utilized  During Treatment: Oxygen (4L O2 via [place]) Nurse Communication: Mobility status  Activity Tolerance: Patient tolerated treatment well Patient left: in bed;with call bell/phone within reach;with SCD's reapplied (Pt provided with soft touch call bell)  OT Visit Diagnosis: Muscle weakness (generalized) (M62.81)                Time: 2423-5361 OT Time Calculation (min): 33 min Charges:  OT General Charges $OT Visit: 1 Visit OT Evaluation $OT Eval Moderate Complexity: [place] OTR/L Acute Rehabilitation Services Pager: [phone] Office: [phone]  Merri Ray Cejay Cambre 08/22/2020, 10:09 AM

## 2020-08-22 NOTE — Evaluation (Cosign Needed)
Clinical/Bedside Swallow Evaluation Patient Details  Name: Cameron Hale [name]: [id number] Date of Birth: [birth year]  Today's Date: 08/22/2020 Time: SLP Start Time (ACUTE ONLY): 86 SLP Stop Time (ACUTE ONLY): 9476 SLP Time Calculation (min) (ACUTE ONLY): 15 min  Past Medical History:  Past Medical History:  Diagnosis Date   Hard of hearing    NF2 (neurofibromatosis 2) ([place])    Pressure ulcer    Quadriplegia ([place])    Seizures ([place])    Vestibular schwannoma ([place])    Past Surgical History:  Past Surgical History:  Procedure Laterality Date   brain bleed     mri 2009   HAND EXPLORATION     LUMBAR FUSION     NEPHRECTOMY     Partial left nephrectomy    SPINAL FUSION     HPI:  Cameron Hale is a 44 y.o. 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 Developed hypoglycemia, hypotension and increased WOB. Presents with acute hypoxic respiratory failure due to acute encephalopathy, acute strokes, chronic weakness from quadriplegia and neurofibromatosis, and acute medication induced pneumonitis.   Assessment / Plan / Recommendation Clinical Impression  Pt was seen for a BSE following recent extubation and was alert and cooperative. He demonstrates a low vocal intensity due to respiratory weakness from his quadriplegia. Respiratory weakness also leaves him unable to elicit a cough reflex. His oral motor function was observed to be generally weak. Pt demonstrated prolonged oral transit and multiple swallows with an ice chip and thin liquid. He did not demonstrate any difficulty with puree. Impaired mastication and prolonged oral transit of a solid was observed. After a delay, he successfully cleared the bolus without any residue in the  oral cavity. No s/sx of aspiration were observed with any POs. Given pt's generalized weakness and risk for silent aspiration, SLP will f/u with instrument testing (FEES).  SLP Visit Diagnosis: Dysphagia, unspecified (R13.10)    Aspiration Risk  Moderate aspiration risk    Diet Recommendation NPO   Medication Administration: Crushed with puree   Other  Recommendations     Follow up Recommendations        Frequency and Duration min 2x/week  2 weeks       Prognosis Prognosis for Safe Diet Advancement: Fair Barriers to Reach Goals: Severity of deficits      Swallow Study   General HPI: Cameron Hale is a 44 y.o. 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 Developed hypoglycemia, hypotension and increased WOB. Presents with acute hypoxic respiratory failure due to acute encephalopathy, acute strokes, chronic weakness from quadriplegia and neurofibromatosis, and acute medication induced pneumonitis. Type of Study: Bedside Swallow Evaluation Diet Prior to this Study: NPO Temperature Spikes Noted: No Respiratory Status: Room air History of Recent Intubation: Yes Length of Intubations (days):  (10) Date extubated: 08/21/20 Behavior/Cognition: Alert;Cooperative;Pleasant mood Oral Cavity Assessment: Within Functional Limits Oral Care Completed by SLP: No Oral Cavity - Dentition: Adequate natural dentition Vision: Impaired for self-feeding Self-Feeding Abilities: Total assist Patient Positioning: Upright in bed Baseline Vocal Quality: Low vocal intensity Volitional Cough: Weak    Oral/Motor/Sensory  Function Overall Oral Motor/Sensory Function: Moderate impairment Facial ROM: Reduced right;Reduced left Facial Symmetry:  Within Functional Limits Lingual ROM: Reduced right;Reduced left Lingual Symmetry: Within Functional Limits Lingual Strength: Reduced   Ice Chips Ice chips: Impaired Presentation: Spoon Oral Phase Functional Implications: Prolonged oral transit Pharyngeal Phase Impairments: Multiple swallows   Thin Liquid Thin Liquid: Impaired Presentation: Straw;Cup Oral Phase Functional Implications: Prolonged oral transit Pharyngeal  Phase Impairments: Multiple swallows    Nectar Thick Nectar Thick Liquid: Not tested   Honey Thick Honey Thick Liquid: Not tested   Puree Puree: Within functional limits   Solid     Solid: Impaired Oral Phase Impairments: Impaired mastication Oral Phase Functional Implications: Prolonged oral transit;Impaired mastication      Greggory Keen 08/22/2020,10:59 AM

## 2020-08-22 NOTE — Progress Notes (Signed)
Patient displaying decreased LOC. stuporous/increased lethargy. Responding with minimal wincing to painful, tactile stimuli to upper torso and sternum.  See ABG, BG stable, VSS. MD paged to bedside. BiPap applied.  Awaiting new orders.

## 2020-08-22 NOTE — Plan of Care (Signed)
  Problem: Education: Goal: Knowledge of General Education information will improve Description: Including pain rating scale, medication(s)/side effects and non-pharmacologic comfort measures Outcome: Progressing   Problem: Health Behavior/Discharge Planning: Goal: Ability to manage health-related needs will improve Outcome: Progressing   Problem: Clinical Measurements: Goal: Ability to maintain clinical measurements within normal limits will improve Outcome: Progressing Goal: Will remain free from infection Outcome: Progressing Goal: Diagnostic test results will improve Outcome: Progressing Goal: Respiratory complications will improve Outcome: Progressing Goal: Cardiovascular complication will be avoided Outcome: Progressing   Problem: Activity: Goal: Risk for activity intolerance will decrease Outcome: Progressing   Problem: Nutrition: Goal: Adequate nutrition will be maintained Outcome: Progressing   Problem: Coping: Goal: Level of anxiety will decrease Outcome: Progressing   Problem: Elimination: Goal: Will not experience complications related to bowel motility Outcome: Progressing Goal: Will not experience complications related to urinary retention Outcome: Progressing   Problem: Pain Managment: Goal: General experience of comfort will improve Outcome: Progressing   Problem: Safety: Goal: Ability to remain free from injury will improve Outcome: Progressing   Problem: Skin Integrity: Goal: Risk for impaired skin integrity will decrease Outcome: Progressing   Problem: Education: Goal: Expressions of having a comfortable level of knowledge regarding the disease process will increase Outcome: Progressing   Problem: Coping: Goal: Ability to adjust to condition or change in health will improve Outcome: Progressing Goal: Ability to identify appropriate support needs will improve Outcome: Progressing   Problem: Health Behavior/Discharge Planning: Goal:  Compliance with prescribed medication regimen will improve Outcome: Progressing   Problem: Medication: Goal: Risk for medication side effects will decrease Outcome: Progressing   Problem: Clinical Measurements: Goal: Complications related to the disease process, condition or treatment will be avoided or minimized Outcome: Progressing Goal: Diagnostic test results will improve Outcome: Progressing   Problem: Safety: Goal: Verbalization of understanding the information provided will improve Outcome: Progressing   Problem: Self-Concept: Goal: Level of anxiety will decrease Outcome: Progressing Goal: Ability to verbalize feelings about condition will improve Outcome: Progressing   Problem: Education: Goal: Knowledge of disease or condition will improve Outcome: Progressing Goal: Knowledge of secondary prevention will improve Outcome: Progressing Goal: Knowledge of patient specific risk factors addressed and post discharge goals established will improve Outcome: Progressing Goal: Individualized Educational Video(s) Outcome: Progressing   Problem: Coping: Goal: Will verbalize positive feelings about self Outcome: Progressing Goal: Will identify appropriate support needs Outcome: Progressing   Problem: Health Behavior/Discharge Planning: Goal: Ability to manage health-related needs will improve Outcome: Progressing   Problem: Self-Care: Goal: Ability to participate in self-care as condition permits will improve Outcome: Progressing Goal: Verbalization of feelings and concerns over difficulty with self-care will improve Outcome: Progressing Goal: Ability to communicate needs accurately will improve Outcome: Progressing   Problem: Nutrition: Goal: Risk of aspiration will decrease Outcome: Progressing Goal: Dietary intake will improve Outcome: Progressing   Problem: Intracerebral Hemorrhage Tissue Perfusion: Goal: Complications of Intracerebral Hemorrhage will be  minimized Outcome: Progressing   Problem: Ischemic Stroke/TIA Tissue Perfusion: Goal: Complications of ischemic stroke/TIA will be minimized Outcome: Progressing   Problem: Spontaneous Subarachnoid Hemorrhage Tissue Perfusion: Goal: Complications of Spontaneous Subarachnoid Hemorrhage will be minimized Outcome: Progressing

## 2020-08-22 NOTE — Progress Notes (Signed)
Nutrition Follow-up  DOCUMENTATION CODES:   Not applicable  INTERVENTION:   Once Cortrak placed on 10/6, initiate tube feeds: - Osmolite 1.5 @ 55 ml/hr (1320 ml/day) - ProSource TF 45 ml TID - Free water per CCM  Tube feeding regimen will provide 2100 kcal, 116 grams of protein, and 1006 ml of H2O.  NUTRITION DIAGNOSIS:   Increased nutrient needs related to wound healing as evidenced by estimated needs.  Ongoing  GOAL:   Patient will meet greater than or equal to 90% of their needs  Will be met via TF  MONITOR:   Diet advancement, Labs, Weight trends, TF tolerance, Skin, I & O's  REASON FOR ASSESSMENT:   Ventilator, Consult Enteral/tube feeding initiation and management, Assessment of nutrition requirement/status  ASSESSMENT:   44 year old male who presented to the ED on 9/21 with AMS. 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 Pt admitted with sepsis, left upper lobe aspiration pneumonia  09/25 - intubated, chest tube insertion 09/30 - chest tube removed 10/04 - extubated  Discussed pt with RN and during ICU rounds. Plan is for FEES later today.  Cortrak order placed per CCM and consult received for tube feeding initiation and management. RD to order tube feeds to start tomorrow after placement of Cortrak tube.  Medications reviewed and include: SSI q 4 hours, solu-medrol, protonix  Labs reviewed: hemoglobin 7.8 CBG's: 83-121 x 24 hours  UOP: 1525 ml x 24 hours Stool: 525 ml + 2 unmeasured occurrences x 24 hours I/O's: -1.8 L since admit  Diet Order:   Diet Order            Diet NPO time specified  Diet effective now                 EDUCATION NEEDS:   No education needs have been identified at this time  Skin:  Skin Assessment: Skin Integrity Issues: Unstageable: sacrum (per WOC note)  Last BM:  08/22/20 525 ml x 24 hours via rectal  tube  Height:   Ht Readings from Last 1 Encounters:  08/12/20 '5\' 10"'  (1.778 m)    Weight:   Wt Readings from Last 1 Encounters:  08/21/20 72.4 kg    Ideal Body Weight:  68 kg (adjusted for quadriplegia)  BMI:  Body mass index is 22.9 kg/m.  Estimated Nutritional Needs:   Kcal:  2000-2200  Protein:  110-130 grams  Fluid:  >/= 1.8 L    Gaynell Face, MS, RD, LDN Inpatient Clinical Dietitian Please see AMiON for contact information.

## 2020-08-22 NOTE — Evaluation (Signed)
Physical Therapy Evaluation/ Discharge Patient Details Name: Cameron Hale [name]: [id number] DOB: [birth year] Today's Date: 08/22/2020   History of Present Illness  44 yo admitted 9/21 with unresponsiveness and seizures. Intubated 9/25-10/4. MRI with bilcerebral frontal infarcts. PMhx: neurofibromatosis with neurosurgical intervention march 2021 with resultant quadriplegia, seizure disorder,left eye blindness, hearing loss  Clinical Impression  Pt seen in conjunction with OT. Pt awake, alert and joking although very soft spoken and difficult to understand at times. Pt with noted RUE atrophy of intrinsics and wrist extensors who reports he uses LUE to control power chair. Pt with no active movement of bil LE and required total assist to roll bil for pericare. Pt at baseline dependent for total care with lift to wheelchair at home. Pt reports continued 24hr care at home and has necessary DME. Pt was receiving HHPT PTA and recommend resume prior services with need for further acute therapy at this time. RN aware of need for positioning changes and ROM. Will sign off.   SpO2 100% on 4L     Follow Up Recommendations Home health PT;Supervision/Assistance - 24 hour- resume prior services    Equipment Recommendations  None recommended by PT    Recommendations for Other Services       Precautions / Restrictions Precautions Precautions: Other (comment) Precaution Comments: quadriplegia      Mobility  Bed Mobility Overal bed mobility: Needs Assistance Bed Mobility: Rolling Rolling: +2 for safety/equipment;Total assist         General bed mobility comments: rolling for peri care around rectal tube, Pt with minimal ability to assist with rolling, max to total assist  Transfers                    Ambulation/Gait                Stairs            Wheelchair Mobility    Modified Rankin (Stroke Patients Only)       Balance                                              Pertinent Vitals/Pain Pain Assessment: Faces Faces Pain Scale: Hurts a little bit Pain Location: grimace with stretching/ROM to limbs Pain Descriptors / Indicators: Grimacing;Tightness Pain Intervention(s): Monitored during session;Repositioned    Home Living Family/patient expects to be discharged to:: Private residence Living Arrangements: Parent;Other relatives Available Help at Discharge: Family;Friend(s);Available 24 hours/day Type of Home: House Home Access: Ramped entrance     Home Layout: One level Home Equipment: Hospital bed;Other (comment);Wheelchair - power Additional Comments: information gathered from previous chart entry    Prior Function Level of Independence: Needs assistance   Gait / Transfers Assistance Needed: max-total assist for bed mobility, hoyer lift to power WC  ADL's / Homemaking Assistance Needed: dependent in ADL  Comments: no family present     Hand Dominance        Extremity/Trunk Assessment   Upper Extremity Assessment Upper Extremity Assessment: Defer to OT evaluation RUE Deficits / Details: full PROM, flat hand arches, joint changes in digits 3-5, no active movement RUE Coordination: decreased fine motor;decreased gross motor LUE Deficits / Details: tightness in shoulder, but able to achieve full PROM, flat hand arches, able to achieve elbow extension at 2+ LUE Coordination: decreased fine motor;decreased gross motor    Lower  Extremity Assessment Lower Extremity Assessment: RLE deficits/detail;LLE deficits/detail RLE Deficits / Details: no AROM or withdrawal to pain. PROM WFL LLE Deficits / Details: no AROM or withdrawal to pain. PROM WFL       Communication   Communication: HOH  Cognition Arousal/Alertness: Awake/alert Behavior During Therapy: WFL for tasks assessed/performed Overall Cognitive Status: No family/caregiver present to determine baseline cognitive functioning Area of Impairment:  Safety/judgement                         Safety/Judgement: Decreased awareness of deficits     General Comments: Pt with eyes open, joking a little, smiling, knew it was tuesday, but also shared that he could get in [facility] by himself PTA      General Comments General comments (skin integrity, edema, etc.): Pt on 4L O2 via [place] throughout session and VSS    Exercises General Exercises - Lower Extremity Ankle Circles/Pumps: PROM;Both;5 reps;Supine Heel Slides: PROM;Both;5 reps;Supine Hip ABduction/ADduction: PROM;Both;Supine;5 reps   Assessment/Plan    PT Assessment All further PT needs can be met in the next venue of care  PT Problem List Decreased mobility;Decreased strength;Decreased activity tolerance;Decreased balance       PT Treatment Interventions      PT Goals (Current goals can be found in the Care Plan section)  Acute Rehab PT Goals Patient Stated Goal: get back home PT Goal Formulation: All assessment and education complete, DC therapy    Frequency     Barriers to discharge        Co-evaluation PT/OT/SLP Co-Evaluation/Treatment: Yes Reason for Co-Treatment: Complexity of the patient's impairments (multi-system involvement);For patient/therapist safety PT goals addressed during session: Mobility/safety with mobility;Strengthening/ROM OT goals addressed during session: ADL's and self-care;Strengthening/ROM       AM-PAC PT "6 Clicks" Mobility  Outcome Measure Help needed turning from your back to your side while in a flat bed without using bedrails?: Total Help needed moving from lying on your back to sitting on the side of a flat bed without using bedrails?: Total Help needed moving to and from a bed to a chair (including a wheelchair)?: Total Help needed standing up from a chair using your arms (e.g., wheelchair or bedside chair)?: Total Help needed to walk in hospital room?: Total Help needed climbing 3-5 steps with a railing? : Total 6 Click Score:  6    End of Session   Activity Tolerance: Patient tolerated treatment well Patient left: in bed Nurse Communication: Need for lift equipment PT Visit Diagnosis: Other abnormalities of gait and mobility (R26.89);Other symptoms and signs involving the nervous system (R29.898)    Time: 3419-6222 PT Time Calculation (min) (ACUTE ONLY): 26 min   Charges:   PT Evaluation $PT Eval Moderate Complexity: [place], PT Acute Rehabilitation Services Pager: [phone] Office: [phone]   Kymora Sciara B Lavena Loretto 08/22/2020, 12:01 PM

## 2020-08-22 NOTE — Progress Notes (Signed)
eLink Physician-Brief Progress Note Patient Name: Cameron Hale DOB: 1976/04/01 [name]: [id number]   Date of Service  08/22/2020  HPI/Events of Note  Hypotension - BP = 78/42 with MAP = 54.  Patient has Midodrine ordered for hypotension, however, the patient is NPO and does not have a gastric tube.   eICU Interventions  Plan: 1. Restart Norepinephrine IV infusion for tonight until able to take PO or patient gets gastric tube replaced. Titrate to MAP >= 65.      Intervention Category Major Interventions: Hypotension - evaluation and management  Chiara Coltrin Cornelia Copa 08/22/2020, 9:31 PM

## 2020-08-22 NOTE — Procedures (Signed)
Objective Swallowing Evaluation: Type of Study: FEES-Fiberoptic Endoscopic Evaluation of Swallow   Patient Details  Name: Cameron Hale [name]: [id number] Date of Birth: [birth year]  Today's Date: 08/22/2020 Time: SLP Start Time (ACUTE ONLY): 1436 -SLP Stop Time (ACUTE ONLY): 1513  SLP Time Calculation (min) (ACUTE ONLY): 37 min   Past Medical History:  Past Medical History:  Diagnosis Date  . Hard of hearing   . 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 Pressure ulcer   . Quadriplegia ([place])   . Seizures ([place])   . Vestibular schwannoma [facility])    Past Surgical History:  Past Surgical History:  Procedure Laterality Date  . brain bleed     mri 2009  . HAND EXPLORATION    . LUMBAR FUSION    . NEPHRECTOMY     Partial left nephrectomy   . SPINAL FUSION     HPI: [name] is a 44 y.o. 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 Developed hypoglycemia, hypotension and increased WOB. Presents with acute hypoxic respiratory failure due to acute encephalopathy, acute strokes, chronic weakness from quadriplegia and neurofibromatosis, and acute medication induced pneumonitis. Pt intubated 9/25-10/4.    No data recorded   Assessment / Plan / Recommendation  CHL IP CLINICAL IMPRESSIONS 08/22/2020  Clinical Impression Pt demonstrates severe, diffuse weakness across oral and pharyngeal musculature leading to delayed oral transit, spillage to pyriforms prior to the swallow and then severe residue after the swallow. Pt has bilateral errythema and indentations on posterior portion of the vocal folds. There is gradual silent aspiration of residue via the posterior commissure. Cued coughs are even too weak to fully expectorate. Pt has  potential for improvement as airway heals and sensation improves. Decreased strength for cough will likely be a prolonged impairment, but pts pharyngeal strength has potential to improve. Will follow for therapeutic interventions, recommend NPO with temporary source of nutrition for now.   SLP Visit Diagnosis Dysphagia, oropharyngeal phase (R13.12)  Attention and concentration deficit following --  Frontal lobe and executive function deficit following --  Impact on safety and function Severe aspiration risk      CHL IP TREATMENT RECOMMENDATION 08/22/2020  Treatment Recommendations Therapy as outlined in treatment plan below     Prognosis 08/22/2020  Prognosis for Safe Diet Advancement Fair  Barriers to Reach Goals Severity of deficits  Barriers/Prognosis Comment --    CHL IP DIET RECOMMENDATION 08/22/2020  SLP Diet Recommendations NPO;Alternative means - temporary  Liquid Administration via --  Medication Administration --  Compensations --  Postural Changes --      No flowsheet data found.    CHL IP FOLLOW UP RECOMMENDATIONS 08/22/2020  Follow up Recommendations LTACH      CHL IP FREQUENCY AND DURATION 08/22/2020  Speech Therapy Frequency (ACUTE ONLY) min 2x/week  Treatment Duration 2 weeks           CHL IP ORAL PHASE 08/22/2020  Oral Phase Impaired  Oral - Pudding Teaspoon --  Oral - Pudding Cup --  Oral - Honey Teaspoon --  Oral - Honey Cup --  Oral - Nectar Teaspoon Decreased bolus cohesion;Delayed oral transit;Weak lingual manipulation  Oral - Nectar Cup Decreased bolus cohesion;Delayed oral transit;Weak lingual manipulation  Oral - Nectar Straw --  Oral - Thin Teaspoon --  Oral - Thin Cup Decreased bolus cohesion;Delayed oral transit;Weak lingual manipulation  Oral - Thin Straw --  Oral - Puree --  Oral - Mech Soft --  Oral - Regular --  Oral - Multi-Consistency --  Oral - Pill --  Oral Phase - Comment --    CHL IP PHARYNGEAL PHASE 08/22/2020  Pharyngeal Phase  Impaired  Pharyngeal- Pudding Teaspoon --  Pharyngeal --  Pharyngeal- Pudding Cup --  Pharyngeal --  Pharyngeal- Honey Teaspoon --  Pharyngeal --  Pharyngeal- Honey Cup --  Pharyngeal --  Pharyngeal- Nectar Teaspoon Delayed swallow initiation-vallecula;Reduced epiglottic inversion;Reduced pharyngeal peristalsis;Reduced anterior laryngeal mobility;Reduced laryngeal elevation;Reduced airway/laryngeal closure;Reduced tongue base retraction;Pharyngeal residue - valleculae;Pharyngeal residue - pyriform;Lateral channel residue;Inter-arytenoid space residue;Compensatory strategies attempted (with notebox)  Pharyngeal --  Pharyngeal- Nectar Cup Delayed swallow initiation-vallecula;Reduced epiglottic inversion;Reduced pharyngeal peristalsis;Reduced anterior laryngeal mobility;Reduced laryngeal elevation;Reduced airway/laryngeal closure;Reduced tongue base retraction;Pharyngeal residue - valleculae;Pharyngeal residue - pyriform;Lateral channel residue;Inter-arytenoid space residue;Compensatory strategies attempted (with notebox);Penetration/Apiration after swallow  Pharyngeal Material enters airway, passes BELOW cords without attempt by patient to eject out (silent aspiration)  Pharyngeal- Nectar Straw --  Pharyngeal --  Pharyngeal- Thin Teaspoon --  Pharyngeal --  Pharyngeal- Thin Cup Delayed swallow initiation-vallecula;Reduced epiglottic inversion;Reduced pharyngeal peristalsis;Reduced anterior laryngeal mobility;Reduced laryngeal elevation;Reduced airway/laryngeal closure;Reduced tongue base retraction;Pharyngeal residue - valleculae;Pharyngeal residue - pyriform;Lateral channel residue;Inter-arytenoid space residue;Compensatory strategies attempted (with notebox);Penetration/Apiration after swallow  Pharyngeal Material enters airway, passes BELOW cords without attempt by patient to eject out (silent aspiration)  Pharyngeal- Thin Straw --  Pharyngeal --  Pharyngeal- Puree --  Pharyngeal --   Pharyngeal- Mechanical Soft --  Pharyngeal --  Pharyngeal- Regular --  Pharyngeal --  Pharyngeal- Multi-consistency --  Pharyngeal --  Pharyngeal- Pill --  Pharyngeal --  Pharyngeal Comment --     No flowsheet data found.  Herbie Baltimore, MA CCC-SLP  Acute Rehabilitation Services Pager [phone] Office [phone]  Lynann Beaver 08/22/2020, 3:37 PM

## 2020-08-22 NOTE — Consult Note (Signed)
WOC Nurse Consult Note: Patient receiving care in [place] Reason for Consult: Sacral wound Patient seen for original consult on 08/10/20 Orders placed at that time for Santyl. These orders have been placed and will continue.  Apply Santyl to sacral wound in a nickel thick layer. Cover with a saline moistened gauze, then dry gauze or ABD pad. Change twice daily. Monitor the wound area(s) for worsening of condition such as: Signs/symptoms of infection, increase in size, development of or worsening of odor, development of pain, or increased pain at the affected locations.   Notify the medical team if any of these develop.  Thank you for the consult. [place] nurse will not follow at this time.   Please re-consult the [place] team if needed.  Cathlean Marseilles Tamala Julian, MSN, RN, [place], Lysle Pearl, [facility] Wound Treatment Associate Pager [phone]

## 2020-08-23 ENCOUNTER — Other Ambulatory Visit: Payer: Medicare Other | Admitting: Nurse Practitioner

## 2020-08-23 DIAGNOSIS — R579 Shock, unspecified: Secondary | ICD-10-CM | POA: Diagnosis not present

## 2020-08-23 DIAGNOSIS — R1310 Dysphagia, unspecified: Secondary | ICD-10-CM

## 2020-08-23 DIAGNOSIS — R569 Unspecified convulsions: Secondary | ICD-10-CM | POA: Diagnosis not present

## 2020-08-23 LAB — CBC
HCT: 22.7 % — ABNORMAL LOW (ref 39.0–52.0)
Hemoglobin: 7.1 g/dL — ABNORMAL LOW (ref 13.0–17.0)
MCH: 25.9 pg — ABNORMAL LOW (ref 26.0–34.0)
MCHC: 31.3 g/dL (ref 30.0–36.0)
MCV: 82.8 fL (ref 80.0–100.0)
Platelets: 421 10*3/uL — ABNORMAL HIGH (ref 150–400)
RBC: 2.74 MIL/uL — ABNORMAL LOW (ref 4.22–5.81)
RDW: 18 % — ABNORMAL HIGH (ref 11.5–15.5)
WBC: 12.5 10*3/uL — ABNORMAL HIGH (ref 4.0–10.5)
nRBC: 0 % (ref 0.0–0.2)

## 2020-08-23 LAB — BASIC METABOLIC PANEL
Anion gap: 11 (ref 5–15)
BUN: 38 mg/dL — ABNORMAL HIGH (ref 6–20)
CO2: 21 mmol/L — ABNORMAL LOW (ref 22–32)
Calcium: 8.4 mg/dL — ABNORMAL LOW (ref 8.9–10.3)
Chloride: 115 mmol/L — ABNORMAL HIGH (ref 98–111)
Creatinine, Ser: 0.42 mg/dL — ABNORMAL LOW (ref 0.61–1.24)
GFR calc non Af Amer: >60 mL/min (ref >60)
Glucose, Bld: 102 mg/dL — ABNORMAL HIGH (ref 70–99)
Potassium: 4 mmol/L (ref 3.5–5.1)
Sodium: 147 mmol/L — ABNORMAL HIGH (ref 135–145)

## 2020-08-23 LAB — GLUCOSE, CAPILLARY
Glucose-Capillary: 175 mg/dL — ABNORMAL HIGH (ref 70–99)
Glucose-Capillary: 195 mg/dL — ABNORMAL HIGH (ref 70–99)
Glucose-Capillary: 71 mg/dL (ref 70–99)
Glucose-Capillary: 73 mg/dL (ref 70–99)
Glucose-Capillary: 77 mg/dL (ref 70–99)
Glucose-Capillary: 82 mg/dL (ref 70–99)

## 2020-08-23 MED ORDER — ONDANSETRON HCL 4 MG/2ML IJ SOLN: 4.0000 mg | Freq: Four times a day (QID) | INTRAMUSCULAR | Status: DC | PRN

## 2020-08-23 MED ORDER — SULFAMETHOXAZOLE-TRIMETHOPRIM 800-160 MG PO TABS
1.0000 | ORAL_TABLET | ORAL | Status: DC
  Administered 2020-08-23 – 2020-09-08 (×8): 1
  Filled 2020-08-23 (×8): qty 1

## 2020-08-23 MED ORDER — ONDANSETRON HCL 4 MG PO TABS: 4.0000 mg | ORAL_TABLET | Freq: Four times a day (QID) | ORAL | Status: DC | PRN

## 2020-08-23 MED ORDER — MIDODRINE HCL 5 MG PO TABS
5.0000 mg | ORAL_TABLET | Freq: Two times a day (BID) | ORAL | Status: DC
  Administered 2020-08-23 – 2020-09-02 (×19): 5 mg
  Filled 2020-08-23 (×22): qty 1

## 2020-08-23 MED ORDER — FREE WATER
100.0000 mL | Freq: Three times a day (TID) | Status: DC
  Administered 2020-08-23 – 2020-09-07 (×43): 100 mL

## 2020-08-23 MED ORDER — HYDRALAZINE HCL 25 MG PO TABS
25.0000 mg | ORAL_TABLET | Freq: Four times a day (QID) | ORAL | Status: DC | PRN
  Administered 2020-09-03 – 2020-09-04 (×2): 25 mg
  Filled 2020-08-23 (×2): qty 1

## 2020-08-23 MED ORDER — ASPIRIN 81 MG PO CHEW
81.0000 mg | CHEWABLE_TABLET | Freq: Every day | ORAL | Status: DC
  Administered 2020-08-24 – 2020-09-08 (×15): 81 mg
  Filled 2020-08-23 (×16): qty 1

## 2020-08-23 NOTE — Progress Notes (Signed)
PROGRESS NOTE    Cameron Hale  UXL:[id number] DOB: 15-Jul-1976 DOA: 08/08/2020 PCP: Alroy Dust, L.Marlou Sa, MD   Brief Narrative:   Cameron Hale is a 44 y.o. male with medical history significant of type 2 neurofibromatosis and seizures. Patient was noted to have a generalized seizure prior to admission, consistent in blank staring and being unresponsive.  Through the course of the day he returned to his baseline.  The patient subsequently became much more somnolent, lethargic, and unresponsive.  Patient subsequently transported to [facility] via EMS. On route he had persistent hypotension and he was brought to [facility] emergency department.  Family indicates patient had neurosurgery intervention in March 2021, post surgery he developed functional quadriplegia, blindness and hearing loss, the only full neurologic function preserved is talking. He was discharged August 2021 home with his parents.  He needs assistance with this basic activities of daily living including bathing and eating.  He does have a decubitus pressure ulcer stage II. He was diagnosed with urinary tract infection about 7 days ago and he had ciprofloxacin prescribed.  In the ED patient noted to be unresponsive, his CT head with advanced meningiomatosis, unchanged vasogenic edema.  No acute bleeding.  Arterial blood gas analysis with normal pH.  Chest radiograph with signs of aspiration pneumonia.  [facility] was contacted, unfortunately unable to perform ED to ED transfer. Patient received intravenous fluids, dexamethasone, 2 g of Keppra, 2 mg IV lorazepam, aztreonam, vancomycin, cefepime and metronidazole. Referred for admission for further evaluation.   9/25 worsening acute hypoxia, patient subsequently intubated,right chest tube x2 9/30 chest tubes were removed 10/4 extubated  10/5 weaned off oxygen, currently on RA  Assessment & Plan:   Principal Problem:   Seizures ([place]) Active Problems:   Paraparesis (HCC)    Mixed conductive and sensorineural hearing loss of right ear with unrestricted hearing of left ear   Quadriplegia ([place])   AMS (altered mental status)   Aspiration pneumonia ([place])   Neurofibromatosis 2 ([place])   Acute respiratory failure with hypoxia (HCC)   Cerebral thrombosis with cerebral infarction   DNR (do not resuscitate) discussion   History of ETT   Palliative care by specialist    Acute respiratory failure with hypoxia secondary to drug induced pneumonitis due to previous mAb NF treatment as OP -Not thought to be infectious, does not meet sepsis criteria - extubated 10/4 - Discussed with his mother again today that he remains high risk for aspiration, pneumonitis and recurrent pneumonias - Wean steroids slowly over 6 weeks. Needs PJP prophylaxis with Bactrim until he is safely staying 20mg  prednisone equivalent daily.  Acute on chronic dysphagia -cortrak and enteral feeds-to be initiated later today once core track placed -has had in the past and was able to be rehabilitate to resume diet at home after speech therapy, continue to advance diet as tolerated  Acute Metabolic Encephalopathy, improving new ischemic infarcts 9/26 - Baseline able to speak but blind in 1 eye and hard of hearing, functionally quadriplegic.  - Seizures ruled out by repeated EEGs.   - Possible component of cefepime related encephalopathy- cefepime stopped 9/25.   - Bilateral ACA strokes likely due to mass effect of patient's meningiomatosis.  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    R Pleural Effusion, improved  - Suspect benign transudate from poor oncotic pressure but is neutrophil predominant, regardless seems to have drained well  - Extubated 10/4, tolerating well. Has remained stable and is off of  [place]  - Continue at bedtime, as needed BiPAP for apneas - Solumedrol for pneumonitis-- plan for 6 week taper  - PJP prophylaxis while on steroids with Bactrim  - If requires  reintubation, would proceed with trach   Hypernatremia -- improved  Hypokalemia Hypomagnesemia  -free water 100 ml  q8 hr (decr to q8 from q6 on 10/4) -follow electrolytes, replace as indicated  Hypo / Hyperglycemia -SSI  -glucose goal 140-180   Labile blood pressures-- improved Suspect autonomic instability related to heavy NF2 burden on spinal cord. P -midodrine 5 mg BID  Neurofibromatosis Type 2 Progressive, treatment (brigatinib) currently on hold.  Followed at [facility] by Dr. Clovis Riley.   Seizure disorder  -supportive care -Keppra (home med)  Microcytic Hypochromic Anemia -MVI  -follow CBC -transfuse for Hgb <7%  Prediabetic A1c 6.1 -SSI  Sacral Decubitus -wound care per nursing   Severe Protein Calorie Malnutrition  -start D5NS 10/5. Dc this when able to either take POs or cortrak placed (pending insertion this afternoon) -SLP following, dietary following  DVT prophylaxis: lovenox Code Status: Full Family Communication: None present  Status is: Inpatient  Dispo: The patient is from: Home              Anticipated d/c is to: To be determined              Anticipated d/c date is: To be determined              Patient currently not medically stable for discharge  Consultants:   PCCM, hospice  Antimicrobials:  Vanc 9/21 >> 9/24 Aztreonam 9/21 Flagyl 9/21 >> 9/25 Cefepime 9/21 >> 9/25 Ceftriaxone 9/26 >> 9/29 Bactrim 10/1 >>  Subjective: No acute issues or events overnight, patient resting comfortably, awake alert, difficult to assess orientation but patient is able to answer simple questions yes or no, denies nausea, vomiting, diarrhea, constipation, headache, fevers, chills.  His only request today was for coffee.  Objective: Vitals:   08/23/20 0500 08/23/20 0600 08/23/20 0615 08/23/20 0700  BP: 101/60 (!) 105/46 107/69 119/72  Pulse: 83 72 74 75  Resp: 18 14 18 14   Temp:      TempSrc:      SpO2: 98% 97% 100% 100%  Weight:      Height:         Intake/Output Summary (Last 24 hours) at 08/23/2020 0744 Last data filed at 08/23/2020 0616 Gross per 24 hour  Intake 513.44 ml  Output 1730 ml  Net -1216.56 ml   Filed Weights   08/14/20 0500 08/21/20 0500  Weight: 72.4 kg 72.4 kg    Examination:  General: Chronically ill appearing gentleman, resting comfortably in bed in NAD HEENT: [place]/AT, MM pink/moist, PERRL,  Neuro: Alert and appears to be at near baseline, answering yes or no to simple questions CV: s1s2 regular rate and rhythm, no murmur, rubs, or gallops,  PULM:   Scant end expiratory wheeze right apex otherwise without rhonchi or rales GI: soft, bowel sounds active in all 4 quadrants, non-tender, non-distended Extremities: warm/dry, no edema  Skin: no rashes or lesions, slightly pale    Data Reviewed: I have personally reviewed following labs and imaging studies  CBC: Recent Labs  Lab 08/19/20 0500 08/19/20 0500 08/20/20 0225 08/20/20 0225 08/20/20 1553 08/21/20 0445 08/22/20 0447 08/22/20 1854 08/23/20 0407  WBC 15.5*  --  16.0*  --   --  14.6* 15.0*  --  12.5*  HGB 8.0*   < > 8.2*   < >  7.8* 7.9* 7.8* 7.5* 7.1*  HCT 24.6*   < > 25.2*   < > 23.0* 23.9* 24.4* 22.0* 22.7*  MCV 78.6*  --  79.0*  --   --  79.9* 80.8  --  82.8  PLT 250  --  296  --   --  340 372  --  421*   < > = values in this interval not displayed.   Basic Metabolic Panel: Recent Labs  Lab 08/17/20 0338 08/18/20 0254 08/19/20 0500 08/19/20 0500 08/20/20 0225 08/20/20 0225 08/20/20 1553 08/21/20 0445 08/22/20 0447 08/22/20 1854 08/23/20 0407  NA 139   < > 141   < > 142   < > 140 142 144 143 147*  K 3.6   < > 4.0   < > 4.3   < > 4.5 3.7 4.4 4.0 4.0  CL 106   < > 105  --  107  --   --  109 111  --  115*  CO2 24   < > 25  --  26  --   --  27 24  --  21*  GLUCOSE 154*   < > 153*  --  137*  --   --  149* 84  --  102*  BUN 35*   < > 41*  --  38*  --   --  42* 42*  --  38*  CREATININE 0.49*   < > 0.42*  --  0.41*  --   --  0.34*  <0.30*  --  0.42*  CALCIUM 7.9*   < > 8.0*  --  8.3*  --   --  8.3* 8.4*  --  8.4*  MG 2.2  --   --   --   --   --   --   --   --   --   --   PHOS 2.2*  --   --   --   --   --   --   --   --   --   --    < > = values in this interval not displayed.   GFR: Estimated Creatinine Clearance: 119.4 mL/min (A) (by C-G formula based on SCr of 0.42 mg/dL (L)). Liver Function Tests: Recent Labs  Lab 08/18/20 0254 08/19/20 0500 08/20/20 0225 08/21/20 0445 08/22/20 0447  AST 52* 38 37 34 27  ALT 87* 89* 92* 93* 91*  ALKPHOS 94 80 80 72 69  BILITOT 0.7 0.8 0.6 0.4 0.4  PROT 4.7* 4.5* 4.7* 4.6* 4.7*  ALBUMIN 1.9* 1.9* 1.9* 1.9* 2.0*   No results for input(s): LIPASE, AMYLASE in the last 168 hours. No results for input(s): AMMONIA in the last 168 hours. Coagulation Profile: No results for input(s): INR, PROTIME in the last 168 hours. Cardiac Enzymes: No results for input(s): CKTOTAL, CKMB, CKMBINDEX, TROPONINI in the last 168 hours. BNP (last 3 results) No results for input(s): PROBNP in the last 8760 hours. HbA1C: No results for input(s): HGBA1C in the last 72 hours. CBG: Recent Labs  Lab 08/22/20 1524 08/22/20 1835 08/22/20 2020 08/23/20 0026 08/23/20 0410  GLUCAP 88 135* 85 71 77   Lipid Profile: No results for input(s): CHOL, HDL, LDLCALC, TRIG, CHOLHDL, LDLDIRECT in the last 72 hours. Thyroid Function Tests: No results for input(s): TSH, T4TOTAL, FREET4, T3FREE, THYROIDAB in the last 72 hours. Anemia Panel: No results for input(s): VITAMINB12, FOLATE, FERRITIN, TIBC, IRON, RETICCTPCT in the last 72 hours. Sepsis Labs: No  results for input(s): PROCALCITON, LATICACIDVEN in the last 168 hours.  Recent Results (from the past 240 hour(s))  Culture, respiratory (non-expectorated)     Status: None   Collection Time: 08/13/20  2:13 PM   Specimen: Tracheal Aspirate; Respiratory  Result Value Ref Range Status   Specimen Description TRACHEAL ASPIRATE  Final   Special Requests  NONE  Final   Gram Stain   Final    RARE WBC PRESENT, PREDOMINANTLY PMN RARE YEAST RARE GRAM POSITIVE COCCI Performed at [facility], [place] [address], [place], [place] [address]    Culture FEW CANDIDA ALBICANS  Final   Report Status 08/16/2020 FINAL  Final  Culture, blood (Routine X 2) w Reflex to ID Panel     Status: None (Preliminary result)   Collection Time: 08/20/20  7:47 PM   Specimen: BLOOD LEFT HAND  Result Value Ref Range Status   Specimen Description BLOOD LEFT HAND  Final   Special Requests   Final    BOTTLES DRAWN AEROBIC AND ANAEROBIC Blood Culture adequate volume   Culture   Final    NO GROWTH 3 DAYS Performed at [facility], [place] [address] [place], [place] [address]    Report Status PENDING  Incomplete  Culture, blood (Routine X 2) w Reflex to ID Panel     Status: None (Preliminary result)   Collection Time: 08/21/20  2:42 AM   Specimen: BLOOD LEFT HAND  Result Value Ref Range Status   Specimen Description BLOOD LEFT HAND  Final   Special Requests   Final    BOTTLES DRAWN AEROBIC ONLY Blood Culture adequate volume   Culture   Final    NO GROWTH 2 DAYS Performed at [facility], [place] [address] [place], [place] [address]    Report Status PENDING  Incomplete  Culture, Urine     Status: None   Collection Time: 08/21/20 11:35 AM   Specimen: Urine, Catheterized  Result Value Ref Range Status   Specimen Description URINE, CATHETERIZED  Final   Special Requests Normal  Final   Culture   Final    NO GROWTH Performed at [place] [address], [place], [place] [address]    Report Status 08/22/2020 FINAL  Final         Radiology Studies: No results found.      Scheduled Meds: . aspirin  81 mg Oral Daily  . chlorhexidine  15 mL Mouth Rinse BID  . Chlorhexidine Gluconate Cloth  6 each Topical Daily  . collagenase   Topical BID  . enoxaparin (LOVENOX) injection  40 mg Subcutaneous Q24H  . feeding supplement (PROSource  TF)  45 mL Per Tube TID  . glycopyrrolate  0.1 mg Intravenous TID  . insulin aspart  0-9 Units Subcutaneous Q4H  . methylPREDNISolone (SOLU-MEDROL) injection  50 mg Intravenous Q12H  . midodrine  5 mg Oral BID WC  . pantoprazole (PROTONIX) IV  40 mg Intravenous QHS  . sodium chloride flush  10-40 mL Intracatheter Q12H  . sulfamethoxazole-trimethoprim  1 tablet Oral Once per day on Mon Wed Fri   Continuous Infusions: . dextrose 5 % and 0.9% NaCl 50 mL/hr at 08/23/20 0616  . feeding supplement (OSMOLITE 1.5 CAL)    . levETIRAcetam Stopped (08/22/20 2002)  . norepinephrine (LEVOPHED) Adult infusion 2 mcg/min (08/23/20 0200)     LOS: 15 days   Time spent: 74min  Tyland Klemens C Diahann Guajardo, DO [facility]  If 7PM-7AM,  please contact night-coverage [email or web address]  08/23/2020, 7:44 AM

## 2020-08-23 NOTE — Plan of Care (Signed)
  Problem: Education: Goal: Knowledge of General Education information will improve Description: Including pain rating scale, medication(s)/side effects and non-pharmacologic comfort measures Outcome: Progressing   Problem: Clinical Measurements: Goal: Ability to maintain clinical measurements within normal limits will improve Outcome: Progressing Goal: Will remain free from infection Outcome: Progressing Goal: Diagnostic test results will improve Outcome: Progressing Goal: Respiratory complications will improve Outcome: Progressing Goal: Cardiovascular complication will be avoided Outcome: Progressing   Problem: Activity: Goal: Risk for activity intolerance will decrease Outcome: Progressing   Problem: Coping: Goal: Level of anxiety will decrease Outcome: Progressing   Problem: Pain Managment: Goal: General experience of comfort will improve Outcome: Progressing   Problem: Safety: Goal: Ability to remain free from injury will improve Outcome: Progressing   Problem: Skin Integrity: Goal: Risk for impaired skin integrity will decrease Outcome: Progressing   Problem: Education: Goal: Knowledge of disease or condition will improve Outcome: Progressing Goal: Knowledge of secondary prevention will improve Outcome: Progressing Goal: Knowledge of patient specific risk factors addressed and post discharge goals established will improve Outcome: Progressing   Problem: Health Behavior/Discharge Planning: Goal: Ability to manage health-related needs will improve Outcome: Not Progressing   Problem: Nutrition: Goal: Adequate nutrition will be maintained Outcome: Not Progressing   Problem: Elimination: Goal: Will not experience complications related to bowel motility Outcome: Not Progressing Goal: Will not experience complications related to urinary retention Outcome: Not Progressing

## 2020-08-23 NOTE — Procedures (Signed)
Cortrak  Person Inserting Tube:  Esaw Dace, RD Tube Type:  Cortrak - 43 inches Tube Location:  Right nare Initial Placement:  Stomach Secured by: Bridle Technique Used to Measure Tube Placement:  Documented cm marking at nare/ corner of mouth Cortrak Secured At:  69 cm    Cortrak Tube Team Note:  Consult received to place a Cortrak feeding tube.   No x-ray is required. RN may begin using tube.   If the tube becomes dislodged please keep the tube and contact the Cortrak team at [email or web address] ([email or web address]) for replacement.  If after hours and replacement cannot be delayed, place a NG tube and confirm placement with an abdominal x-ray.   Kerman Passey MS, RDN, LDN, CNSC Registered Dietitian III Clinical Nutrition RD Pager and On-Call Pager Number Located in [place]

## 2020-08-23 NOTE — Progress Notes (Signed)
NAME:  JAN OLANO, [name]:  [id number], DOB:  February 23, 1976, LOS: 73 ADMISSION DATE:  08/08/2020, CONSULTATION DATE:  08/12/20 REFERRING MD:  Opyd, CHIEF COMPLAINT:  unresponsive   Brief History   44 year old man with hx of type 2 neurofibromatosis, quadriplegia, blindness, hearing loss (baseline only able to talk) presenting with recurrent unresponsive episodes as well as generalized seizure activity.  Brought to [facility] for further management.  Unfortunately less responsive this AM with hypotension and hypoxemia so PCCM consulted.  Patient unable to provide any history.  Normally followed by [facility] but due to instability was brought into [place].  Past Medical History  Neurofibromatosis 2 Neurosurgical intervention March 2021 with postoperative course complicated by functional quadriplegia, blindness, hearing loss (baseline only able to talk)  Decubitus pressue ulcer  Significant Hospital Events   9/21 admitted to [facility] 9/25 ETT, PICC, arterial line, right chest tube 9/30 See progress note from Dr. Marcheta Grammes conversation with mother, patient exam 10/01 On vent, interactive with nodding yes/no  10/4 extubated  10/5 remains stable. Off on [facility]   Consults:  Neurology  Procedures:  R PICC 9/24 >> ETT 9/25 >9/4 L radial aline 9/25 >> out R pigtail CT 9/25 >> 9/30  Significant Diagnostic Tests:  CT Brain 9/21>> no acute infarct, extensive meningiomatosis.  Unchanged prominent vasogenic edema within the underlying mid-to-anterior frontal lobes and crossing the corpus callosum. As before, there is herniation of the rectus gyri into the sella [place].  EEG 9/21 >> no evidence of seizure CT Head 9/26 >> Motion degraded exam. Question an approximate 1.3 cm area of increased hypodensity involving the posterior limb of the right internal capsule. While this finding may be artifactual in nature given motion artifact on this exam, a possible evolving ischemic infarct is difficult to exclude, and could  be considered in the correct clinical setting. MR brain 9/26 >> Cortically based acute infarcts involving the bilateral frontal lobes most prominent in the ACA territory. Small superior vermian acute infarct. Continuous EEG 9/26 >> no evidence of seizures, EEG discontinued  CT Head 9/27 >> No significant interval change of multifocal evolving ischemic infarcts, better evaluated on recent brain MRI. No hemorrhagic transformation or other complication. Extensive underlying changes related to NF2, stable. No new intracranial abnormality. CTA head/ neck 9/27 >> Negative CTA for large vessel occlusion. No hemodynamically significant or correctable stenosis. The intracranial circulation is diffusely diminutive but remains patent. Multifocal opacities throughout the visualized lungs, concerning for multifocal infection/pneumonia. Large layering left pleural effusion, partially visualized.  Micro Data:  9/21 COVID >> neg 9/21 HIV >> negative 9/21 UC >> neg 9/21 BCx 2 >> neg 9/24 MRSA >> neg 9/25 R pleural fluid cx >> negative  9/25 R pleural fluid g/s >> negative 9/26 Tracheal aspirate >> few candida albicans  Antimicrobials:  Vanc 9/21 >> 9/24 Aztreonam 9/21 Flagyl 9/21 >> 9/25 Cefepime 9/21 >> 9/25 Ceftriaxone 9/26 >> 9/29 Bactrim 10/1 >>   Interim history/subjective:  Lying in bed visiting with his mom, states he feels well.  Requesting coffee this morning  Currently on RA  Objective   Blood pressure 119/72, pulse 75, temperature (!) 102.8 F (39.3 C), temperature source Axillary, resp. rate 14, height 5\' 10"  (1.778 m), weight 72.4 kg, SpO2 100 %.    Vent Mode: BIPAP;PCV FiO2 (%):  [40 %] 40 % Set Rate:  [10 bmp-18 bmp] 18 bmp PEEP:  [6 cmH20] 6 cmH20   Intake/Output Summary (Last 24 hours) at 08/23/2020 0704 Last data  filed at 08/23/2020 0616 Gross per 24 hour  Intake 513.44 ml  Output 1730 ml  Net -1216.56 ml   Filed Weights   08/14/20 0500 08/21/20 0500  Weight: 72.4 kg  72.4 kg    Examination: General: Chronically ill appearing deconditioned male lying in bed in NAD HEENT: [place]/AT, MM pink/moist, PERRL,  Neuro: Alert and appears to be at baseline mentation, able to answer basic orientation questions  CV: s1s2 regular rate and rhythm, no murmur, rubs, or gallops,  PULM:  Faint expiratory wheeze, currently on RA, no increased work of breathing GI: soft, bowel sounds active in all 4 quadrants, non-tender, non-distended Extremities: warm/dry, no edema  Skin: no rashes or lesions, slightly pale   Recent Labs  Lab 08/17/20 0338 08/18/20 0254 08/19/20 0500 08/19/20 0500 08/20/20 0225 08/20/20 0225 08/20/20 1553 08/20/20 1553 08/21/20 0445 08/21/20 0445 08/22/20 0447 08/22/20 0447 08/22/20 1854 08/23/20 0407  NA 139   < > 141   < > 142   < > 140  --  142  --  144  --  143 147*  K 3.6   < > 4.0   < > 4.3   < > 4.5   < > 3.7   < > 4.4   < > 4.0 4.0  CL 106   < > 105  --  107  --   --   --  109  --  111  --   --  115*  CO2 24   < > 25  --  26  --   --   --  27  --  24  --   --  21*  GLUCOSE 154*   < > 153*  --  137*  --   --   --  149*  --  84  --   --  102*  BUN 35*   < > 41*  --  38*  --   --   --  42*  --  42*  --   --  38*  CREATININE 0.49*   < > 0.42*  --  0.41*  --   --   --  0.34*  --  <0.30*  --   --  0.42*  CALCIUM 7.9*   < > 8.0*  --  8.3*  --   --   --  8.3*  --  8.4*  --   --  8.4*  MG 2.2  --   --   --   --   --   --   --   --   --   --   --   --   --   PHOS 2.2*  --   --   --   --   --   --   --   --   --   --   --   --   --    < > = values in this interval not displayed.   Recent Labs  Lab 08/21/20 0445 08/21/20 0445 08/22/20 0447 08/22/20 1854 08/23/20 0407  HGB 7.9*   < > 7.8* 7.5* 7.1*  HCT 23.9*   < > 24.4* 22.0* 22.7*  WBC 14.6*  --  15.0*  --  12.5*  PLT 340  --  372  --  421*   < > = values in this interval not displayed.     Resolved Hospital Problem list   Hypernatremia  Anasarca  Persistent hypoglycemia  Assessment & Plan:   Acute Metabolic Encephalopathy, improving -Baseline able to speak but blind in 1 eye and hard of hearing, functionally quadriplegic. Seizures ruled out by repeated EEGs.  Possible component of cefepime related encephalopathy- cefepime stopped 9/25 New ischemic infarcts 9/26 -Bilateral ACA strokes likely due to mass effect of patient's meningiomatosis.  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   P Supportive care  Delirium precautions   Acute Hypoxemic Respiratory Failure, improved  -extubated 10/4, tolerating well. Has remained stable and is off of [place]  Suspected Pneumonitis  -Patient was on brigatinib as off-label trial which has 5% incidence of pneumonitis, but given his encephalopathy and imaging findings he is more aspiration pneumonitis and third spacing due to poor baseline nutritional status.  Another possibility is progressive neurofibromas affecting secretion clearance and diaphragm strength.  R Pleural Effusion, improved  -Suspect benign transudate from poor oncotic pressure but is neutrophil predominant, regardless seems to have drained well  P Continue prolonged (6week) steroid taper  PJP prophylaxis while on steroids  If reintubation required would recommend early transition to trach  Encourage pulmonary hygiene   Hypernatremia  -Beginning to slightly increase again Hypokalemia, resolved Hypomagnesemia, resolved  P: Continue free water  Follow electrolytes  Supplement as needed   Hypo / Hyperglycemia Prediabetic -A1c 6.1 P: Continue SSI CBG goal 140-180  Labile blood pressures-- improved -Suspect autonomic instability related to heavy NF2 burden on spinal cord. P: Currently requiring very low dose levo, wean as able  MAO goal > 65  Resume midodrine once cortrak placed  Neurofibromatosis Type 2 -Progressive, treatment (brigatinib) currently on hold.  Followed at [facility] by Dr. Clovis Riley.  Seizure disorder  P: Supportive  care Continue home Keppra   Microcytic Hypochromic Anemia P: Continue multivitamin Follow CBC  Transfuse per protocol   Sacral Decubitus P: Wound care per nursing  Frequent turns  Frequent peri care   Severe Protein Calorie Malnutrition  -start D5NS 10/5. Dc this when able to either take POs or cortrak placed  P: Plan for Cortrak placement today 10/6 One abel to enterally feed stop dextrose  SLP following   Best practice:  Diet:NPO  Pain/Anxiety/Delirium protocol (if indicated): n/a VAP protocol (if indicated): na DVT prophylaxis: lovenox GI prophylaxis: PPI Glucose control: q4h checks Mobility: BR Code Status: Full code  Family Communication:  Discussed with patient and brother at bedside  Disposition: Transfer to progressive once [ressor support has been stopped   CRITICAL CARE Performed by: Johnsie Cancel  Total critical care time: 37 minutes  Critical care time was exclusive of separately billable procedures and treating other patients.  Critical care was necessary to treat or prevent imminent or life-threatening deterioration.  Critical care was time spent personally by me on the following activities: development of treatment plan with patient and/or surrogate as well as nursing, discussions with consultants, evaluation of patient's response to treatment, examination of patient, obtaining history from patient or surrogate, ordering and performing treatments and interventions, ordering and review of laboratory studies, ordering and review of radiographic studies, pulse oximetry and re-evaluation of patient's condition.  Johnsie Cancel, NP-C  Pulmonary & Critical Care Contact / Pager information can be found on Amion  08/23/2020, 11:07 AM

## 2020-08-23 NOTE — Progress Notes (Signed)
eLink Physician-Brief Progress Note Patient Name: Cameron Hale DOB: 03-06-1976 [name]: [id number]   Date of Service  08/23/2020  HPI/Events of Note  Hypoglycemia - Blood glucose = 71.   eICU Interventions  Plan: 1. Will increase D5 0.9 NaCl IV infusion to 50 mL/hour.      Intervention Category Major Interventions: Other:  Emelia Sandoval Cornelia Copa 08/23/2020, 1:06 AM

## 2020-08-23 NOTE — Progress Notes (Signed)
[facility]) Outpatient Palliative Care  Cameron Hale is our palliative pt in the community.  [facility] will follow while he is hospitalized.  Thank you, Venia Carbon RN, BSN, CCRN

## 2020-08-24 ENCOUNTER — Encounter (HOSPITAL_BASED_OUTPATIENT_CLINIC_OR_DEPARTMENT_OTHER): Payer: Medicare Other | Admitting: Internal Medicine

## 2020-08-24 DIAGNOSIS — R569 Unspecified convulsions: Secondary | ICD-10-CM | POA: Diagnosis not present

## 2020-08-24 LAB — CBC
HCT: 23.2 % — ABNORMAL LOW (ref 39.0–52.0)
Hemoglobin: 7.1 g/dL — ABNORMAL LOW (ref 13.0–17.0)
MCH: 25.8 pg — ABNORMAL LOW (ref 26.0–34.0)
MCHC: 30.6 g/dL (ref 30.0–36.0)
MCV: 84.4 fL (ref 80.0–100.0)
Platelets: 346 10*3/uL (ref 150–400)
RBC: 2.75 MIL/uL — ABNORMAL LOW (ref 4.22–5.81)
RDW: 18.5 % — ABNORMAL HIGH (ref 11.5–15.5)
WBC: 9.3 10*3/uL (ref 4.0–10.5)
nRBC: 0 % (ref 0.0–0.2)

## 2020-08-24 LAB — BASIC METABOLIC PANEL
Anion gap: 8 (ref 5–15)
BUN: 34 mg/dL — ABNORMAL HIGH (ref 6–20)
CO2: 24 mmol/L (ref 22–32)
Calcium: 8.4 mg/dL — ABNORMAL LOW (ref 8.9–10.3)
Chloride: 117 mmol/L — ABNORMAL HIGH (ref 98–111)
Creatinine, Ser: 0.45 mg/dL — ABNORMAL LOW (ref 0.61–1.24)
GFR calc non Af Amer: >60 mL/min (ref >60)
Glucose, Bld: 89 mg/dL (ref 70–99)
Potassium: 4 mmol/L (ref 3.5–5.1)
Sodium: 149 mmol/L — ABNORMAL HIGH (ref 135–145)

## 2020-08-24 LAB — GLUCOSE, CAPILLARY
Glucose-Capillary: 122 mg/dL — ABNORMAL HIGH (ref 70–99)
Glucose-Capillary: 134 mg/dL — ABNORMAL HIGH (ref 70–99)
Glucose-Capillary: 140 mg/dL — ABNORMAL HIGH (ref 70–99)
Glucose-Capillary: 165 mg/dL — ABNORMAL HIGH (ref 70–99)
Glucose-Capillary: 168 mg/dL — ABNORMAL HIGH (ref 70–99)
Glucose-Capillary: 176 mg/dL — ABNORMAL HIGH (ref 70–99)
Glucose-Capillary: 86 mg/dL (ref 70–99)

## 2020-08-24 MED ORDER — METHYLPREDNISOLONE SODIUM SUCC 40 MG IJ SOLR
40.0000 mg | Freq: Two times a day (BID) | INTRAMUSCULAR | Status: DC
  Administered 2020-08-24 – 2020-09-03 (×21): 40 mg via INTRAVENOUS
  Filled 2020-08-24 (×22): qty 1

## 2020-08-24 MED ORDER — PANTOPRAZOLE SODIUM 40 MG PO TBEC: 40.0000 mg | DELAYED_RELEASE_TABLET | Freq: Every day | ORAL | Status: DC

## 2020-08-24 MED ORDER — PANTOPRAZOLE SODIUM 40 MG PO PACK
40.0000 mg | PACK | Freq: Every day | ORAL | Status: DC
  Administered 2020-08-24 – 2020-09-03 (×11): 40 mg
  Filled 2020-08-24 (×12): qty 20

## 2020-08-24 NOTE — Progress Notes (Signed)
   08/24/20 1400  Clinical Encounter Type  Visited With Family  Visit Type Initial  Referral From Nurse  The chaplain met with visitor [name]) at bedside about POA. The chaplain explained that the medical POA is what is offered here at the hospital. They will review the documentation. The chaplain will follow up as needed.

## 2020-08-24 NOTE — Progress Notes (Signed)
Foley Catheter Evaluation  Pt previously had a foley, and was removed on 10/3. On 10/4, RN on night shift bladder scanned pt and results showed >450ml retained. I/O performed by RN on night shift. Per provider order placed on 10/4, insert foley d/t I/O x4 w/ hx of urinary retention at baseline and self craterization at home.  Per my assessment, I believe there is a strong need to continue the foley catheter at this time.

## 2020-08-24 NOTE — Progress Notes (Signed)
PROGRESS NOTE    Cameron Hale  ZOX:[id number] DOB: 09/29/1976 DOA: 08/08/2020 PCP: Alroy Dust, L.Marlou Sa, MD   Brief Narrative:   Cameron Hale is a 44 y.o. male with medical history significant of type 2 neurofibromatosis and seizures. Patient was noted to have a generalized seizure prior to admission, consistent in blank staring and being unresponsive.  Through the course of the day he returned to his baseline.  The patient subsequently became much more somnolent, lethargic, and unresponsive.  Patient subsequently transported to [facility] via EMS. On route he had persistent hypotension and he was brought to [facility] emergency department.  Family indicates patient had neurosurgery intervention in March 2021, post surgery he developed functional quadriplegia, blindness and hearing loss, the only full neurologic function preserved is talking. He was discharged August 2021 home with his parents.  He needs assistance with this basic activities of daily living including bathing and eating.  He does have a decubitus pressure ulcer stage II. He was diagnosed with urinary tract infection about 7 days ago and he had ciprofloxacin prescribed.  In the ED patient noted to be unresponsive, his CT head with advanced meningiomatosis, unchanged vasogenic edema.  No acute bleeding.  Arterial blood gas analysis with normal pH.  Chest radiograph with signs of aspiration pneumonia.  [facility] was contacted, unfortunately unable to perform ED to ED transfer. Patient received intravenous fluids, dexamethasone, 2 g of Keppra, 2 mg IV lorazepam, aztreonam, vancomycin, cefepime and metronidazole. Referred for admission for further evaluation.   9/25 worsening acute hypoxia, patient subsequently intubated,right chest tube x2 9/30 chest tubes were removed 10/4 extubated  10/5 weaned off oxygen, currently on RA 10/6 coretrak placed 10/7 weaned off levophed; mild hypothermia overnight - improving with  bairhugger  Assessment & Plan:   Principal Problem:   Seizures ([place]) Active Problems:   Paraparesis (HCC)   Mixed conductive and sensorineural hearing loss of right ear with unrestricted hearing of left ear   Quadriplegia ([place])   AMS (altered mental status)   Aspiration pneumonia ([place])   Neurofibromatosis 2 ([place])   Acute respiratory failure with hypoxia ([place])   Cerebral thrombosis with cerebral infarction   DNR (do not resuscitate) discussion   History of ETT   Palliative care by specialist   Acute respiratory failure with hypoxia secondary to drug induced pneumonitis due to previous mAb NF treatment as OP - Not thought to be infectious, does not meet sepsis criteria - Extubated 10/4 - Discussed with his mother again today that he remains high risk for aspiration, pneumonitis and recurrent pneumonias - Wean steroids slowly over 6 weeks. Needs PJP prophylaxis with Bactrim until he is safely staying 20mg  prednisone equivalent daily.  Acute on chronic dysphagia - Cortrak and enteral feeds ongoing - Has had in the past and was able to be rehabilitate to resume diet at home after speech therapy, continue to advance diet as tolerated - May require PEG placement prior to DC if PO intake continues to be poor  Acute Metabolic Encephalopathy, improving new ischemic infarcts 9/26 - Baseline able to speak but blind in 1 eye and hard of hearing, functionally quadriplegic.  - Seizures ruled out by repeated EEGs.   - Possible component of cefepime related encephalopathy- cefepime stopped 9/25.   - Bilateral ACA strokes likely due to mass effect of patient's meningiomatosis.  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    R Pleural Effusion, improved  - Suspect benign transudate from poor oncotic  pressure but is neutrophil predominant, regardless seems to have drained well  - Extubated 10/4, tolerating well. Has remained stable and is off of [place]  - Continue at bedtime, as needed  BiPAP for apneas - Solumedrol for pneumonitis-- plan for 6 week taper  - PJP prophylaxis while on steroids with Bactrim  - If requires reintubation, would proceed with trach   Hypernatremia -- improved  Hypokalemia Hypomagnesemia  -free water 100 ml  q8 hr (decr to q8 from q6 on 10/4) -follow electrolytes, replace as indicated  Hypo / Hyperglycemia -SSI  -glucose goal 140-180   Labile blood pressures-- improved Suspect autonomic instability related to heavy NF2 burden on spinal cord. P -midodrine 5 mg BID  Neurofibromatosis Type 2 Progressive, treatment (brigatinib) currently on hold.  Followed at [facility] by Dr. Clovis Riley.   Seizure disorder  -supportive care -Keppra (home med)  Microcytic Hypochromic Anemia -MVI  -follow CBC -transfuse for Hgb <7%  Prediabetic A1c 6.1 -SSI  Sacral Decubitus -wound care per nursing   Severe Protein Calorie Malnutrition  -start D5NS 10/5. Dc this when able to either take POs or cortrak placed (pending insertion this afternoon) -SLP following, dietary following  DVT prophylaxis: lovenox Code Status: Full Family Communication: Father at bedside  Status is: Inpatient  Dispo: The patient is from: Home              Anticipated d/c is to: To be determined              Anticipated d/c date is: To be determined              Patient currently not medically stable for discharge  Consultants:   PCCM, hospice  Antimicrobials:  Vanc 9/21 >> 9/24 Aztreonam 9/21 Flagyl 9/21 >> 9/25 Cefepime 9/21 >> 9/25 Ceftriaxone 9/26 >> 9/29 Bactrim 10/1 >>  Subjective: No acute issues or events overnight, patient resting comfortably, awake alert, difficult to assess orientation but patient is able to answer simple questions; denies nausea, vomiting, diarrhea, constipation, headache, fevers, chills.  His only request today was for coffee.  Objective: Vitals:   08/24/20 0321 08/24/20 0400 08/24/20 0500 08/24/20 0600  BP: 129/90 124/86  122/80 137/86  Pulse: 61 62 63 70  Resp: (!) 24 13 14 16   Temp:      TempSrc:      SpO2:  100% 100% 100%  Weight:      Height:        Intake/Output Summary (Last 24 hours) at 08/24/2020 0749 Last data filed at 08/24/2020 0500 Gross per 24 hour  Intake 1343.4 ml  Output 770 ml  Net 573.4 ml   Filed Weights   08/14/20 0500 08/21/20 0500  Weight: 72.4 kg 72.4 kg    Examination:  General: Chronically ill appearing gentleman, resting comfortably in bed in NAD HEENT: [place]/AT, MM pink/moist, PERRL,  Neuro: Alert and appears to be at near baseline, answering yes or no to simple questions CV: s1s2 regular rate and rhythm, no murmur, rubs, or gallops,  PULM:   Scant end expiratory wheeze right apex otherwise without rhonchi or rales GI: soft, bowel sounds active in all 4 quadrants, non-tender, non-distended Extremities: warm/dry, no edema  Skin: no rashes or lesions, slightly pale    Data Reviewed: I have personally reviewed following labs and imaging studies  CBC: Recent Labs  Lab 08/20/20 0225 08/20/20 1553 08/21/20 0445 08/22/20 0447 08/22/20 1854 08/23/20 0407 08/24/20 0416  WBC 16.0*  --  14.6* 15.0*  --  12.5* 9.3  HGB 8.2*   < > 7.9* 7.8* 7.5* 7.1* 7.1*  HCT 25.2*   < > 23.9* 24.4* 22.0* 22.7* 23.2*  MCV 79.0*  --  79.9* 80.8  --  82.8 84.4  PLT 296  --  340 372  --  421* 346   < > = values in this interval not displayed.   Basic Metabolic Panel: Recent Labs  Lab 08/20/20 0225 08/20/20 1553 08/21/20 0445 08/22/20 0447 08/22/20 1854 08/23/20 0407 08/24/20 0416  NA 142   < > 142 144 143 147* 149*  K 4.3   < > 3.7 4.4 4.0 4.0 4.0  CL 107  --  109 111  --  115* 117*  CO2 26  --  27 24  --  21* 24  GLUCOSE 137*  --  149* 84  --  102* 89  BUN 38*  --  42* 42*  --  38* 34*  CREATININE 0.41*  --  0.34* <0.30*  --  0.42* 0.45*  CALCIUM 8.3*  --  8.3* 8.4*  --  8.4* 8.4*   < > = values in this interval not displayed.   GFR: Estimated Creatinine Clearance:  119.4 mL/min (A) (by C-G formula based on SCr of 0.45 mg/dL (L)). Liver Function Tests: Recent Labs  Lab 08/18/20 0254 08/19/20 0500 08/20/20 0225 08/21/20 0445 08/22/20 0447  AST 52* 38 37 34 27  ALT 87* 89* 92* 93* 91*  ALKPHOS 94 80 80 72 69  BILITOT 0.7 0.8 0.6 0.4 0.4  PROT 4.7* 4.5* 4.7* 4.6* 4.7*  ALBUMIN 1.9* 1.9* 1.9* 1.9* 2.0*   No results for input(s): LIPASE, AMYLASE in the last 168 hours. No results for input(s): AMMONIA in the last 168 hours. Coagulation Profile: No results for input(s): INR, PROTIME in the last 168 hours. Cardiac Enzymes: No results for input(s): CKTOTAL, CKMB, CKMBINDEX, TROPONINI in the last 168 hours. BNP (last 3 results) No results for input(s): PROBNP in the last 8760 hours. HbA1C: No results for input(s): HGBA1C in the last 72 hours. CBG: Recent Labs  Lab 08/23/20 1544 08/23/20 2013 08/24/20 0011 08/24/20 0418 08/24/20 0733  GLUCAP 175* 195* 165* 86 122*   Lipid Profile: No results for input(s): CHOL, HDL, LDLCALC, TRIG, CHOLHDL, LDLDIRECT in the last 72 hours. Thyroid Function Tests: No results for input(s): TSH, T4TOTAL, FREET4, T3FREE, THYROIDAB in the last 72 hours. Anemia Panel: No results for input(s): VITAMINB12, FOLATE, FERRITIN, TIBC, IRON, RETICCTPCT in the last 72 hours. Sepsis Labs: No results for input(s): PROCALCITON, LATICACIDVEN in the last 168 hours.  Recent Results (from the past 240 hour(s))  Culture, blood (Routine X 2) w Reflex to ID Panel     Status: None (Preliminary result)   Collection Time: 08/20/20  7:47 PM   Specimen: BLOOD LEFT HAND  Result Value Ref Range Status   Specimen Description BLOOD LEFT HAND  Final   Special Requests   Final    BOTTLES DRAWN AEROBIC AND ANAEROBIC Blood Culture adequate volume   Culture   Final    NO GROWTH 3 DAYS Performed at [facility], [place] [address], [place], [place] [address]    Report Status PENDING  Incomplete  Culture, blood (Routine X 2) w Reflex to  ID Panel     Status: None (Preliminary result)   Collection Time: 08/21/20  2:42 AM   Specimen: BLOOD LEFT HAND  Result Value Ref Range Status   Specimen Description BLOOD LEFT HAND  Final  Special Requests   Final    BOTTLES DRAWN AEROBIC ONLY Blood Culture adequate volume   Culture   Final    NO GROWTH 2 DAYS Performed at [facility], [place] [address], [place], [place] [address]    Report Status PENDING  Incomplete  Culture, Urine     Status: None   Collection Time: 08/21/20 11:35 AM   Specimen: Urine, Catheterized  Result Value Ref Range Status   Specimen Description URINE, CATHETERIZED  Final   Special Requests Normal  Final   Culture   Final    NO GROWTH Performed at [facility] [address] [place], [place] [address]    Report Status 08/22/2020 FINAL  Final     Radiology Studies: No results found.  Scheduled Meds: . aspirin  81 mg Per Tube Daily  . chlorhexidine  15 mL Mouth Rinse BID  . Chlorhexidine Gluconate Cloth  6 each Topical Daily  . collagenase   Topical BID  . enoxaparin (LOVENOX) injection  40 mg Subcutaneous Q24H  . feeding supplement (PROSource TF)  45 mL Per Tube TID  . free water  100 mL Per Tube Q8H  . glycopyrrolate  0.1 mg Intravenous TID  . insulin aspart  0-9 Units Subcutaneous Q4H  . methylPREDNISolone (SOLU-MEDROL) injection  50 mg Intravenous Q12H  . midodrine  5 mg Per Tube BID WC  . pantoprazole sodium  40 mg Per Tube QHS  . sodium chloride flush  10-40 mL Intracatheter Q12H  . sulfamethoxazole-trimethoprim  1 tablet Per Tube Once per day on Mon Wed Fri   Continuous Infusions: . dextrose 5 % and 0.9% NaCl Stopped (08/23/20 1805)  . feeding supplement (OSMOLITE 1.5 CAL) 55 mL/hr at 08/23/20 2200  . levETIRAcetam 1,500 mg (08/24/20 0734)  . norepinephrine (LEVOPHED) Adult infusion Stopped (08/23/20 1941)     LOS: 16 days   Time spent: 28min  Hilberto Burzynski C Alinah Sheard, DO [facility]  If 7PM-7AM, please contact  night-coverage [email or web address]  08/24/2020, 7:49 AM

## 2020-08-24 NOTE — Progress Notes (Signed)
PCCM progress note   Patient was weaned off pressor support yesterday afternoon 10/7 at 1900. Per chart review and assessment patient remains stable for transfer out of ICU and continued care per Hospitalist team.   At this time PCCM will sign off. Thank you for the opportunity to participate in this patient's care. Please contact if we can be of further assistance.  Johnsie Cancel, NP-C [place] [facility] Contact / Pager information can be found on Amion  08/24/2020, 7:24 AM

## 2020-08-25 DIAGNOSIS — R569 Unspecified convulsions: Secondary | ICD-10-CM | POA: Diagnosis not present

## 2020-08-25 LAB — CULTURE, BLOOD (ROUTINE X 2)
Culture: NO GROWTH
Special Requests: ADEQUATE

## 2020-08-25 LAB — GLUCOSE, CAPILLARY
Glucose-Capillary: 124 mg/dL — ABNORMAL HIGH (ref 70–99)
Glucose-Capillary: 130 mg/dL — ABNORMAL HIGH (ref 70–99)
Glucose-Capillary: 141 mg/dL — ABNORMAL HIGH (ref 70–99)
Glucose-Capillary: 147 mg/dL — ABNORMAL HIGH (ref 70–99)
Glucose-Capillary: 163 mg/dL — ABNORMAL HIGH (ref 70–99)
Glucose-Capillary: 170 mg/dL — ABNORMAL HIGH (ref 70–99)
Glucose-Capillary: 187 mg/dL — ABNORMAL HIGH (ref 70–99)

## 2020-08-25 NOTE — Progress Notes (Signed)
PROGRESS NOTE    Cameron Hale  EYC:[id number] DOB: Mar 06, 1976 DOA: 08/08/2020 PCP: Alroy Dust, L.Marlou Sa, MD   Brief Narrative:   Cameron Hale is a 44 y.o. male with medical history significant of type 2 neurofibromatosis and seizures. Patient was noted to have a generalized seizure prior to admission, consistent in blank staring and being unresponsive.  Through the course of the day he returned to his baseline.  The patient subsequently became much more somnolent, lethargic, and unresponsive.  Patient subsequently transported to [facility] via EMS. On route he had persistent hypotension and he was brought to [facility] emergency department.  Family indicates patient had neurosurgery intervention in March 2021, post surgery he developed functional quadriplegia, blindness and hearing loss, the only full neurologic function preserved is talking. He was discharged August 2021 home with his parents.  He needs assistance with this basic activities of daily living including bathing and eating.  He does have a decubitus pressure ulcer stage II. He was diagnosed with urinary tract infection about 7 days ago and he had ciprofloxacin prescribed.  In the ED patient noted to be unresponsive, his CT head with advanced meningiomatosis, unchanged vasogenic edema.  No acute bleeding.  Arterial blood gas analysis with normal pH.  Chest radiograph with signs of aspiration pneumonia.  [facility] was contacted, unfortunately unable to perform ED to ED transfer. Patient received intravenous fluids, dexamethasone, 2 g of Keppra, 2 mg IV lorazepam, aztreonam, vancomycin, cefepime and metronidazole. Referred for admission for further evaluation.   9/25 worsening acute hypoxia, patient subsequently intubated,right chest tube x2 9/30 chest tubes were removed 10/4 extubated  10/5 weaned off oxygen, currently on RA 10/6 coretrak placed 10/7 weaned off levophed; mild hypothermia overnight - improving with  bairhugger 10/8 stable, continue speech evaluation family requesting reevaluation and consideration for advancement of diet as they are hesitant to place PEG tube  Assessment & Plan:   Principal Problem:   Seizures ([place]) Active Problems:   Paraparesis ([place])   Mixed conductive and sensorineural hearing loss of right ear with unrestricted hearing of left ear   Quadriplegia ([place])   AMS (altered mental status)   Aspiration pneumonia ([place])   Neurofibromatosis 2 ([place])   Acute respiratory failure with hypoxia ([place])   Cerebral thrombosis with cerebral infarction   DNR (do not resuscitate) discussion   History of ETT   Palliative care by specialist   Acute respiratory failure with hypoxia secondary to drug induced pneumonitis due to previous mAb NF treatment as OP, resolved - Not thought to be infectious, does not meet sepsis criteria - Extubated 10/4 - Discussed with his mother again today that he remains high risk for aspiration, pneumonitis and recurrent pneumonias - Wean steroids slowly over 6 weeks. Needs PJP prophylaxis with Bactrim until he is safely staying 20mg  prednisone equivalent daily.  Acute on chronic dysphagia, ongoing - Cortrak and enteral feeds ongoing - Family indicates he has had a chronic issues with dysphagia in the past and was able to be rehabilitate to resume diet at home after speech therapy, we have asked speech therapy to reevaluate the patient today - Family quite hesitant to discuss PEG tube placement any further at this point without further speech evaluation, we discussed that patient will need to be able to tolerate enough calories and free water to maintain volume status and nutrition status as he continues physical therapy and especially if they wish to continue chemotherapy in the near future.  They indicate that he has  overcome swallowing difficulties before and hold out hope he will be able to tolerate p.o. safely in the near future.  Acute Metabolic  Encephalopathy,  resolving New ischemic infarcts 9/26 - Baseline able to speak but blind in 1 eye and hard of hearing, functionally quadriplegic -currently ANO x4, appears to be back to baseline per father at bedside  - Seizures ruled out by repeated EEGs.   - Possible component of cefepime related encephalopathy- cefepime stopped 9/25.   - Bilateral ACA strokes likely due to mass effect of patient's meningiomatosis.  - Also has progressive neurofibromatosis type II of brain and spinal cord.    R Pleural Effusion, resolved - Suspect benign transudate from poor oncotic pressure, resolved - Extubated 10/4, tolerating well. Has remained stable and is off of [place]  - Continue at bedtime, as needed BiPAP for apneas - Solumedrol for pneumonitis-- plan for 6 week taper  - PJP prophylaxis while on steroids with Bactrim  - If requires reintubation, would proceed with trach   Hypernatremia, improving Hypokalemia Hypomagnesemia  -free water 100 ml  q8 hr (decr to q8 from q6 on 10/4) -follow electrolytes, replace as indicated  Hypo / Hyperglycemia -SSI  -glucose goal 140-180   Labile blood pressures-- improved - Suspect autonomic instability related to heavy NF2 burden on spinal cord. -Continue midodrine 5 mg BID  Neurofibromatosis Type 2 -Chronic, progressive, treatment (brigatinib) currently on hold.  Followed at [facility] by Dr. Clovis Riley.  -Family hoping to have follow-up with [facility] in the next few weeks after discharge  Seizure disorder  -Keppra ongoing, remains seizure-free at this point  Microcytic Hypochromic Anemia -MVI  -follow CBC -transfuse for Hgb <7%  Prediabetic A1c 6.1 -SSI ongoing, hypoglycemic protocol as needed  Sacral Decubitus -wound care per nursing   Severe Protein Calorie Malnutrition  -Discontinue IV fluids, continue NG tube feedings and free water flushes per dietary -SLP following, dietary following  DVT prophylaxis: lovenox Code Status:  Full Family Communication: Father at bedside  Status is: Inpatient  Dispo: The patient is from: Home              Anticipated d/c is to: To be determined              Anticipated d/c date is: To be determined, pending safe p.o. intake and NG tube removal versus PEG tube placement at this point              Patient currently not medically stable for discharge  Consultants:   PCCM, hospice  Antimicrobials:  Vanc 9/21 >> 9/24 Aztreonam 9/21 Flagyl 9/21 >> 9/25 Cefepime 9/21 >> 9/25 Ceftriaxone 9/26 >> 9/29 Bactrim 10/1 >> ongoing  Subjective: No acute issues or events overnight, lengthy discussion with father at bedside patient appears to be back to baseline other than swallowing at this point, looking forward to discharge after speech evaluation in hopes the patient can tolerate p.o. safely.  We discussed possible need for PEG tube placement if this does not improve.  Objective: Vitals:   08/24/20 2000 08/25/20 0002 08/25/20 0319 08/25/20 0747  BP: (!) 94/52 114/79 119/83 106/67  Pulse: 97 84 78 85  Resp: (!) 22 15 15 14   Temp: 99.7 F (37.6 C) 98.1 F (36.7 C) 97.8 F (36.6 C) 97.6 F (36.4 C)  TempSrc: Oral Axillary Axillary Oral  SpO2: 97% 99% 97% 99%  Weight:      Height:        Intake/Output Summary (Last 24 hours) at 08/25/2020  6967 Last data filed at 08/25/2020 0556 Gross per 24 hour  Intake 1565 ml  Output 930 ml  Net 635 ml   Filed Weights   08/14/20 0500 08/21/20 0500  Weight: 72.4 kg 72.4 kg    Examination:  General: Chronically ill appearing gentleman, resting comfortably in bed in NAD HEENT: [place]/AT, MM pink/moist, PERRL, core track in place and right nare Neuro: Alert and appears to be at near baseline, answering yes or no to simple questions CV: s1s2 regular rate and rhythm, no murmur, rubs, or gallops,  PULM:   Scant end expiratory wheeze right apex otherwise without rhonchi or rales GI: soft, bowel sounds active in all 4 quadrants, non-tender,  non-distended Extremities: warm/dry, no edema  Skin: no rashes or lesions, slightly pale    Data Reviewed: I have personally reviewed following labs and imaging studies  CBC: Recent Labs  Lab 08/20/20 0225 08/20/20 1553 08/21/20 0445 08/22/20 0447 08/22/20 1854 08/23/20 0407 08/24/20 0416  WBC 16.0*  --  14.6* 15.0*  --  12.5* 9.3  HGB 8.2*   < > 7.9* 7.8* 7.5* 7.1* 7.1*  HCT 25.2*   < > 23.9* 24.4* 22.0* 22.7* 23.2*  MCV 79.0*  --  79.9* 80.8  --  82.8 84.4  PLT 296  --  340 372  --  421* 346   < > = values in this interval not displayed.   Basic Metabolic Panel: Recent Labs  Lab 08/20/20 0225 08/20/20 1553 08/21/20 0445 08/22/20 0447 08/22/20 1854 08/23/20 0407 08/24/20 0416  NA 142   < > 142 144 143 147* 149*  K 4.3   < > 3.7 4.4 4.0 4.0 4.0  CL 107  --  109 111  --  115* 117*  CO2 26  --  27 24  --  21* 24  GLUCOSE 137*  --  149* 84  --  102* 89  BUN 38*  --  42* 42*  --  38* 34*  CREATININE 0.41*  --  0.34* <0.30*  --  0.42* 0.45*  CALCIUM 8.3*  --  8.3* 8.4*  --  8.4* 8.4*   < > = values in this interval not displayed.   GFR: Estimated Creatinine Clearance: 119.4 mL/min (A) (by C-G formula based on SCr of 0.45 mg/dL (L)). Liver Function Tests: Recent Labs  Lab 08/19/20 0500 08/20/20 0225 08/21/20 0445 08/22/20 0447  AST 38 37 34 27  ALT 89* 92* 93* 91*  ALKPHOS 80 80 72 69  BILITOT 0.8 0.6 0.4 0.4  PROT 4.5* 4.7* 4.6* 4.7*  ALBUMIN 1.9* 1.9* 1.9* 2.0*   No results for input(s): LIPASE, AMYLASE in the last 168 hours. No results for input(s): AMMONIA in the last 168 hours. Coagulation Profile: No results for input(s): INR, PROTIME in the last 168 hours. Cardiac Enzymes: No results for input(s): CKTOTAL, CKMB, CKMBINDEX, TROPONINI in the last 168 hours. BNP (last 3 results) No results for input(s): PROBNP in the last 8760 hours. HbA1C: No results for input(s): HGBA1C in the last 72 hours. CBG: Recent Labs  Lab 08/24/20 1542 08/24/20 2024  08/25/20 0006 08/25/20 0323 08/25/20 0745  GLUCAP 176* 140* 147* 170* 187*   Lipid Profile: No results for input(s): CHOL, HDL, LDLCALC, TRIG, CHOLHDL, LDLDIRECT in the last 72 hours. Thyroid Function Tests: No results for input(s): TSH, T4TOTAL, FREET4, T3FREE, THYROIDAB in the last 72 hours. Anemia Panel: No results for input(s): VITAMINB12, FOLATE, FERRITIN, TIBC, IRON, RETICCTPCT in the last 72 hours. Sepsis  Labs: No results for input(s): PROCALCITON, LATICACIDVEN in the last 168 hours.  Recent Results (from the past 240 hour(s))  Culture, blood (Routine X 2) w Reflex to ID Panel     Status: None   Collection Time: 08/20/20  7:47 PM   Specimen: BLOOD LEFT HAND  Result Value Ref [place] Status   Specimen Description BLOOD LEFT HAND  Final   Special Requests   Final    BOTTLES DRAWN AEROBIC AND ANAEROBIC Blood Culture adequate volume   Culture   Final    NO GROWTH 5 DAYS Performed at [facility], [address] [place], [place] [address]    Report Status 08/25/2020 FINAL  Final  Culture, blood (Routine X 2) w Reflex to ID Panel     Status: None (Preliminary result)   Collection Time: 08/21/20  2:42 AM   Specimen: BLOOD LEFT HAND  Result Value Ref [place] Status   Specimen Description BLOOD LEFT HAND  Final   Special Requests   Final    BOTTLES DRAWN AEROBIC ONLY Blood Culture adequate volume   Culture   Final    NO GROWTH 4 DAYS Performed at [facility], [place] [address], [place], [place] [address]    Report Status PENDING  Incomplete  Culture, Urine     Status: None   Collection Time: 08/21/20 11:35 AM   Specimen: Urine, Catheterized  Result Value [place] Status   Specimen Description URINE, CATHETERIZED  Final   Special Requests Normal  Final   Culture   Final    NO GROWTH Performed at [place] [address], [place], [place] [address]    Report Status 08/22/2020 FINAL  Final     Radiology Studies: No results found.  Scheduled  Meds: . aspirin  81 mg Per Tube Daily  . chlorhexidine  15 mL Mouth Rinse BID  . Chlorhexidine Gluconate Cloth  6 each Topical Daily  . collagenase   Topical BID  . enoxaparin (LOVENOX) injection  40 mg Subcutaneous Q24H  . feeding supplement (PROSource TF)  45 mL Per Tube TID  . free water  100 mL Per Tube Q8H  . glycopyrrolate  0.1 mg Intravenous TID  . insulin aspart  0-9 Units Subcutaneous Q4H  . methylPREDNISolone (SOLU-MEDROL) injection  40 mg Intravenous Q12H  . midodrine  5 mg Per Tube BID WC  . pantoprazole sodium  40 mg Per Tube QHS  . sodium chloride flush  10-40 mL Intracatheter Q12H  . sulfamethoxazole-trimethoprim  1 tablet Per Tube Once per day on Mon Wed Fri   Continuous Infusions: . dextrose 5 % and 0.9% NaCl Stopped (08/23/20 1805)  . feeding supplement (OSMOLITE 1.5 CAL) 55 mL/hr at 08/25/20 0556  . levETIRAcetam 1,500 mg (08/24/20 2140)     LOS: 17 days   Time spent: 85min  Phyllis Whitefield C Cameron Vizzini, DO [facility]  If 7PM-7AM, please contact night-coverage [email or web address]  08/25/2020, 8:08 AM

## 2020-08-25 NOTE — Progress Notes (Signed)
  Speech Language Pathology Treatment: Dysphagia  Patient Details Name: JAEDIN REGINA [name]: [id number] DOB: 11-08-1976 Today's Date: 08/25/2020 Time: 0962-8366 SLP Time Calculation (min) (ACUTE ONLY): 25 min  Assessment / Plan / Recommendation Clinical Impression  F/u after 10/5 FEES.  Pt repositioned in bed, oral care provided.  Father at bedside.  Pt accepted PO trials of ice chips with verbal cues needed to execute an effortful swallow.  There was mild, weak throat-clearing noted intermittently - pt had significant difficulty generating a cued cough.  Voice was of good vocal quality throughout trials. We reviewed results of FEES and the reasons for dysphagia (pt has hx of dysphagia and worked intensively on his swallowing during July 2021 admission to [place]). There was notable erythema and vocal fold trauma noted during FEES, most certainly contributing to dysphagia. Recommend allowing 4-5 ice chips, 3x/day, after oral care and when positioned in upright posture.  D/W pt, his father, and Therapist, sports.  SLP will continue to follow.    HPI HPI: Mr. KYLIN DUBS is a 44 y.o. male with history of neurofibromatosis type II with extensive cervical neuromas-underwent surgery March 2021 and unfortunately complicated by postoperative infection and quadriplegia, seizures on keppra, blind OS w/ impaired eye movements, presenting to [facility] 9/22 with altered mental status. Developed hypoglycemia, hypotension and increased WOB. Presents with acute hypoxic respiratory failure due to acute encephalopathy, acute strokes, chronic weakness from quadriplegia and neurofibromatosis, and acute medication induced pneumonitis. Pt intubated 9/25-10/4.  FEES 10/5-  severe, diffuse weakness across oral and pharyngeal musculature leading to delayed oral transit, spillage to pyriforms prior to the swallow and then severe residue after the swallow. Pt has bilateral errythema and indentations on posterior portion of the vocal  folds. There is gradual silent aspiration of residue via the posterior commissure.       SLP Plan  Continue with current plan of care       Recommendations  Diet recommendations: NPO Medication Administration: Crushed with puree                Oral Care Recommendations: Oral care QID SLP Visit Diagnosis: Dysphagia, oropharyngeal phase (R13.12) Plan: Continue with current plan of care       GO               Kelechi Orgeron L. Tivis Ringer, [place] CCC/SLP Acute Rehabilitation Services Office number [phone] Pager [phone]  Juan Quam Laurice 08/25/2020, 12:10 PM

## 2020-08-25 NOTE — Plan of Care (Signed)
  Problem: Education: Goal: Knowledge of General Education information will improve Description: Including pain rating scale, medication(s)/side effects and non-pharmacologic comfort measures Outcome: Progressing   Problem: Health Behavior/Discharge Planning: Goal: Ability to manage health-related needs will improve Outcome: Progressing   Problem: Clinical Measurements: Goal: Ability to maintain clinical measurements within normal limits will improve Outcome: Progressing Goal: Will remain free from infection Outcome: Progressing Goal: Diagnostic test results will improve Outcome: Progressing Goal: Respiratory complications will improve Outcome: Progressing Goal: Cardiovascular complication will be avoided Outcome: Progressing   Problem: Activity: Goal: Risk for activity intolerance will decrease Outcome: Progressing   Problem: Nutrition: Goal: Adequate nutrition will be maintained Outcome: Progressing   Problem: Coping: Goal: Level of anxiety will decrease Outcome: Progressing   Problem: Elimination: Goal: Will not experience complications related to bowel motility Outcome: Progressing Goal: Will not experience complications related to urinary retention Outcome: Progressing   Problem: Pain Managment: Goal: General experience of comfort will improve Outcome: Progressing   Problem: Safety: Goal: Ability to remain free from injury will improve Outcome: Progressing   Problem: Skin Integrity: Goal: Risk for impaired skin integrity will decrease Outcome: Progressing   Problem: Education: Goal: Expressions of having a comfortable level of knowledge regarding the disease process will increase Outcome: Progressing   Problem: Coping: Goal: Ability to adjust to condition or change in health will improve Outcome: Progressing Goal: Ability to identify appropriate support needs will improve Outcome: Progressing   Problem: Health Behavior/Discharge Planning: Goal:  Compliance with prescribed medication regimen will improve Outcome: Progressing   Problem: Medication: Goal: Risk for medication side effects will decrease Outcome: Progressing   Problem: Clinical Measurements: Goal: Complications related to the disease process, condition or treatment will be avoided or minimized Outcome: Progressing Goal: Diagnostic test results will improve Outcome: Progressing   Problem: Safety: Goal: Verbalization of understanding the information provided will improve Outcome: Progressing   Problem: Self-Concept: Goal: Level of anxiety will decrease Outcome: Progressing Goal: Ability to verbalize feelings about condition will improve Outcome: Progressing   Problem: Education: Goal: Knowledge of disease or condition will improve Outcome: Progressing Goal: Knowledge of secondary prevention will improve Outcome: Progressing Goal: Knowledge of patient specific risk factors addressed and post discharge goals established will improve Outcome: Progressing Goal: Individualized Educational Video(s) Outcome: Progressing   Problem: Coping: Goal: Will verbalize positive feelings about self Outcome: Progressing Goal: Will identify appropriate support needs Outcome: Progressing   Problem: Health Behavior/Discharge Planning: Goal: Ability to manage health-related needs will improve Outcome: Progressing   Problem: Self-Care: Goal: Ability to participate in self-care as condition permits will improve Outcome: Progressing Goal: Verbalization of feelings and concerns over difficulty with self-care will improve Outcome: Progressing Goal: Ability to communicate needs accurately will improve Outcome: Progressing   Problem: Nutrition: Goal: Risk of aspiration will decrease Outcome: Progressing Goal: Dietary intake will improve Outcome: Progressing   Problem: Intracerebral Hemorrhage Tissue Perfusion: Goal: Complications of Intracerebral Hemorrhage will be  minimized Outcome: Progressing   Problem: Ischemic Stroke/TIA Tissue Perfusion: Goal: Complications of ischemic stroke/TIA will be minimized Outcome: Progressing   Problem: Spontaneous Subarachnoid Hemorrhage Tissue Perfusion: Goal: Complications of Spontaneous Subarachnoid Hemorrhage will be minimized Outcome: Progressing

## 2020-08-26 DIAGNOSIS — R569 Unspecified convulsions: Secondary | ICD-10-CM | POA: Diagnosis not present

## 2020-08-26 LAB — GLUCOSE, CAPILLARY
Glucose-Capillary: 110 mg/dL — ABNORMAL HIGH (ref 70–99)
Glucose-Capillary: 132 mg/dL — ABNORMAL HIGH (ref 70–99)
Glucose-Capillary: 132 mg/dL — ABNORMAL HIGH (ref 70–99)
Glucose-Capillary: 135 mg/dL — ABNORMAL HIGH (ref 70–99)
Glucose-Capillary: 142 mg/dL — ABNORMAL HIGH (ref 70–99)
Glucose-Capillary: 161 mg/dL — ABNORMAL HIGH (ref 70–99)

## 2020-08-26 LAB — BASIC METABOLIC PANEL
Anion gap: 8 (ref 5–15)
BUN: 32 mg/dL — ABNORMAL HIGH (ref 6–20)
CO2: 26 mmol/L (ref 22–32)
Calcium: 8.3 mg/dL — ABNORMAL LOW (ref 8.9–10.3)
Chloride: 114 mmol/L — ABNORMAL HIGH (ref 98–111)
Creatinine, Ser: 0.31 mg/dL — ABNORMAL LOW (ref 0.61–1.24)
GFR, Estimated: >60 mL/min (ref >60)
Glucose, Bld: 159 mg/dL — ABNORMAL HIGH (ref 70–99)
Potassium: 4.7 mmol/L (ref 3.5–5.1)
Sodium: 148 mmol/L — ABNORMAL HIGH (ref 135–145)

## 2020-08-26 LAB — CBC
HCT: 25 % — ABNORMAL LOW (ref 39.0–52.0)
Hemoglobin: 7.5 g/dL — ABNORMAL LOW (ref 13.0–17.0)
MCH: 25.9 pg — ABNORMAL LOW (ref 26.0–34.0)
MCHC: 30 g/dL (ref 30.0–36.0)
MCV: 86.2 fL (ref 80.0–100.0)
Platelets: 372 10*3/uL (ref 150–400)
RBC: 2.9 MIL/uL — ABNORMAL LOW (ref 4.22–5.81)
RDW: 19 % — ABNORMAL HIGH (ref 11.5–15.5)
WBC: 7.9 10*3/uL (ref 4.0–10.5)
nRBC: 0 % (ref 0.0–0.2)

## 2020-08-26 LAB — CULTURE, BLOOD (ROUTINE X 2)
Culture: NO GROWTH
Special Requests: ADEQUATE

## 2020-08-26 NOTE — Progress Notes (Signed)
PROGRESS NOTE    Cameron Hale  OBS:[id number] DOB: [birth year] DOA: 08/08/2020 PCP: Alroy Dust, L.Marlou Sa, MD   Brief Narrative:   Cameron Hale is a 44 y.o. male with medical history significant of type 2 neurofibromatosis and seizures. Patient was noted to have a generalized seizure prior to admission, consistent in blank staring and being unresponsive.  Through the course of the day he returned to his baseline.  The patient subsequently became much more somnolent, lethargic, and unresponsive.  Patient subsequently transported to [facility] via EMS. On route he had persistent hypotension and he was brought to [facility] emergency department.  Family indicates patient had neurosurgery intervention in March 2021, post surgery he developed functional quadriplegia, blindness and hearing loss, the only full neurologic function preserved is talking. He was discharged August 2021 home with his parents.  He needs assistance with this basic activities of daily living including bathing and eating.  He does have a decubitus pressure ulcer stage II. He was diagnosed with urinary tract infection about 7 days ago and he had ciprofloxacin prescribed.  In the ED patient noted to be unresponsive, his CT head with advanced meningiomatosis, unchanged vasogenic edema.  No acute bleeding.  Arterial blood gas analysis with normal pH.  Chest radiograph with signs of aspiration pneumonia.  [facility] was contacted, unfortunately unable to perform ED to ED transfer. Patient received intravenous fluids, dexamethasone, 2 g of Keppra, 2 mg IV lorazepam, aztreonam, vancomycin, cefepime and metronidazole. Referred for admission for further evaluation.   9/25 worsening acute hypoxia, patient subsequently intubated,right chest tube x2 9/30 chest tubes were removed 10/4 extubated  10/5 weaned off oxygen, currently on RA 10/6 coretrak placed 10/7 weaned off levophed; mild hypothermia overnight - improving with  bairhugger 10/8 stable, continue speech evaluation family requesting reevaluation and consideration for advancement of diet as they are hesitant to place PEG tube 10/9 speech continues to recommend n.p.o. status with minimal p.o. challenges over the weekend with ice chips/similar.  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 Needs PJP prophylaxis with Bactrim until he is safely staying 20mg  prednisone equivalent daily.  Acute on chronic dysphagia, ongoing - Cortrak and enteral feeds ongoing - Family indicates he has had a chronic issues with dysphagia in the past and was able to be rehabilitate to resume diet at home after speech therapy, we have asked speech therapy to reevaluate the patient today - Family quite hesitant to discuss PEG tube placement any further at this point without further speech evaluation, we discussed that patient will need to be able to tolerate enough calories and free water to maintain volume status and nutrition status as he continues physical therapy and  especially if they wish to continue chemotherapy in the near future.  They indicate that he has overcome swallowing  difficulties before and hold out hope he will be able to tolerate p.o. safely in the near future.  Acute Metabolic Encephalopathy,  resolving New ischemic infarcts 9/26 - Baseline able to speak but blind in 1 eye and hard of hearing, functionally quadriplegic -currently ANO x4, appears to be back to baseline per father at bedside  - Seizures ruled out by repeated EEGs.   - Possible component of cefepime related encephalopathy- cefepime stopped 9/25.   - Bilateral ACA strokes likely due to mass effect of patient's meningiomatosis.  - Also has progressive neurofibromatosis type II of brain and spinal cord.    R Pleural Effusion, resolved - Suspect benign transudate from poor oncotic pressure, resolved - Extubated 10/4, tolerating well. Has remained stable and is off of [place]  - Continue at bedtime, as needed BiPAP for apneas - Solumedrol for pneumonitis-- plan for 6 week taper  - PJP prophylaxis while on steroids with Bactrim  - If requires reintubation, would proceed with trach   Hypernatremia, improving Hypokalemia Hypomagnesemia  -free water 100 ml  q8 hr (decr to q8 from q6 on 10/4) -follow electrolytes, replace as indicated  Hypo / Hyperglycemia -SSI  -glucose goal 140-180   Labile blood pressures-- improved - Suspect autonomic instability related to heavy NF2 burden on spinal cord. -Continue midodrine 5 mg BID  Neurofibromatosis Type 2 -Chronic, progressive, treatment (brigatinib) currently on hold.  [facility] at [facility] by Dr. Clovis Riley.  -Family hoping to have follow-up with [facility] in the next few weeks after discharge  Seizure disorder  -Keppra ongoing, remains seizure-free at this point  Microcytic Hypochromic Anemia -MVI  -follow CBC -transfuse for Hgb <7%  Prediabetic A1c 6.1 -SSI ongoing, hypoglycemic protocol as needed  Sacral Decubitus -wound care per nursing   Severe Protein Calorie Malnutrition  -Discontinue IV fluids, continue NG tube feedings  and free water flushes per dietary -SLP following, dietary following  DVT prophylaxis: lovenox Code Status: Full Family Communication: Father at bedside  Status is: Inpatient  Dispo: The patient is from: Home              Anticipated d/c is to: To be determined              Anticipated d/c date is: To be determined, pending safe p.o. intake and NG tube removal versus PEG tube placement at this point              Patient currently not medically stable for discharge  Consultants:   PCCM, hospice  Antimicrobials:  Vanc 9/21 >> 9/24 Aztreonam 9/21 Flagyl 9/21 >> 9/25 Cefepime 9/21 >> 9/25 Ceftriaxone 9/26 >> 9/29 Bactrim 10/1 >> ongoing  Subjective: No acute issues or events overnight, lengthy discussion with father at bedside patient appears to be back to baseline other than swallowing at this point, looking forward to discharge after speech evaluation in hopes the patient can tolerate p.o. safely.  We discussed possible need for PEG tube placement if this does not improve.  Objective: Vitals:   08/25/20 2015 08/25/20 2323 08/26/20 0400 08/26/20 0743  BP: 128/86 129/85 137/85 124/89  Pulse: 77 80 84 80  Resp: 16 17 16 17   Temp: 97.6 F (36.4 C) 97.7 F (36.5 C) 97.7 F (36.5 C) 98.3 F (36.8 C)  TempSrc: Oral Oral Oral Oral  SpO2: 100% 99% 97% 100%  Weight:  Height:        Intake/Output Summary (Last 24 hours) at 08/26/2020 0820 Last data filed at 08/26/2020 6283 Gross per 24 hour  Intake 2118 ml  Output 1600 ml  Net 518 ml   Filed Weights   08/14/20 0500 08/21/20 0500  Weight: 72.4 kg 72.4 kg    Examination:  General: Chronically ill appearing gentleman, resting comfortably in bed in NAD HEENT: [place]/AT, MM pink/moist, PERRL, core track in place and right nare Neuro: Alert and appears to be at near baseline, answering yes or no to simple questions CV: s1s2 regular rate and rhythm, no murmur, rubs, or gallops,  PULM:   Scant end expiratory wheeze right apex  otherwise without rhonchi or rales GI: soft, bowel sounds active in all 4 quadrants, non-tender, non-distended Extremities: warm/dry, no edema  Skin: no rashes or lesions, slightly pale    Data Reviewed: I have personally reviewed following labs and imaging studies  CBC: Recent Labs  Lab 08/21/20 0445 08/21/20 0445 08/22/20 0447 08/22/20 1854 08/23/20 0407 08/24/20 0416 08/26/20 0530  WBC 14.6*  --  15.0*  --  12.5* 9.3 7.9  HGB 7.9*   < > 7.8* 7.5* 7.1* 7.1* 7.5*  HCT 23.9*   < > 24.4* 22.0* 22.7* 23.2* 25.0*  MCV 79.9*  --  80.8  --  82.8 84.4 86.2  PLT 340  --  372  --  421* 346 372   < > = values in this interval not displayed.   Basic Metabolic Panel: Recent Labs  Lab 08/21/20 0445 08/21/20 0445 08/22/20 0447 08/22/20 1854 08/23/20 0407 08/24/20 0416 08/26/20 0530  NA 142   < > 144 143 147* 149* 148*  K 3.7   < > 4.4 4.0 4.0 4.0 4.7  CL 109  --  111  --  115* 117* 114*  CO2 27  --  24  --  21* 24 26  GLUCOSE 149*  --  84  --  102* 89 159*  BUN 42*  --  42*  --  38* 34* 32*  CREATININE 0.34*  --  <0.30*  --  0.42* 0.45* 0.31*  CALCIUM 8.3*  --  8.4*  --  8.4* 8.4* 8.3*   < > = values in this interval not displayed.   GFR: Estimated Creatinine Clearance: 119.4 mL/min (A) (by C-G formula based on SCr of 0.31 mg/dL (L)). Liver Function Tests: Recent Labs  Lab 08/20/20 0225 08/21/20 0445 08/22/20 0447  AST 37 34 27  ALT 92* 93* 91*  ALKPHOS 80 72 69  BILITOT 0.6 0.4 0.4  PROT 4.7* 4.6* 4.7*  ALBUMIN 1.9* 1.9* 2.0*   No results for input(s): LIPASE, AMYLASE in the last 168 hours. No results for input(s): AMMONIA in the last 168 hours. Coagulation Profile: No results for input(s): INR, PROTIME in the last 168 hours. Cardiac Enzymes: No results for input(s): CKTOTAL, CKMB, CKMBINDEX, TROPONINI in the last 168 hours. BNP (last 3 results) No results for input(s): PROBNP in the last 8760 hours. HbA1C: No results for input(s): HGBA1C in the last 72  hours. CBG: Recent Labs  Lab 08/25/20 1706 08/25/20 2053 08/25/20 2321 08/26/20 0331 08/26/20 0751  GLUCAP 163* 141* 130* 161* 135*   Lipid Profile: No results for input(s): CHOL, HDL, LDLCALC, TRIG, CHOLHDL, LDLDIRECT in the last 72 hours. Thyroid Function Tests: No results for input(s): TSH, T4TOTAL, FREET4, T3FREE, THYROIDAB in the last 72 hours. Anemia Panel: No results for input(s): VITAMINB12, FOLATE, FERRITIN, TIBC, IRON,  RETICCTPCT in the last 72 hours. Sepsis Labs: No results for input(s): PROCALCITON, LATICACIDVEN in the last 168 hours.  Recent Results (from the past 240 hour(s))  Culture, blood (Routine X 2) w Reflex to ID Panel     Status: None   Collection Time: 08/20/20  7:47 PM   Specimen: BLOOD LEFT HAND  Result Value Ref Range Status   Specimen Description BLOOD LEFT HAND  Final   Special Requests   Final    BOTTLES DRAWN AEROBIC AND ANAEROBIC Blood Culture adequate volume   Culture   Final    NO GROWTH 5 DAYS Performed at [facility], [address], [place], [place] [address]    Report Status 08/25/2020 FINAL  Final  Culture, blood (Routine X 2) w Reflex to ID Panel     Status: None   Collection Time: 08/21/20  2:42 AM   Specimen: BLOOD LEFT HAND  Result Value Ref Range Status   Specimen Description BLOOD LEFT HAND  Final   Special Requests   Final    BOTTLES DRAWN AEROBIC ONLY Blood Culture adequate volume   Culture   Final    NO GROWTH 5 DAYS Performed at [facility], [place] [address], [place], [place] [address]    Report Status 08/26/2020 FINAL  Final  Culture, Urine     Status: None   Collection Time: 08/21/20 11:35 AM   Specimen: Urine, Catheterized  Result Value Ref Range Status   Specimen Description URINE, CATHETERIZED  Final   Special Requests Normal  Final   Culture   Final    NO GROWTH Performed at [facility] [address], [place], [place] [address]    Report Status 08/22/2020 FINAL  Final     Radiology  Studies: No results found.  Scheduled Meds: . aspirin  81 mg Per Tube Daily  . chlorhexidine  15 mL Mouth Rinse BID  . Chlorhexidine Gluconate Cloth  6 each Topical Daily  . collagenase   Topical BID  . enoxaparin (LOVENOX) injection  40 mg Subcutaneous Q24H  . feeding supplement (PROSource TF)  45 mL Per Tube TID  . free water  100 mL Per Tube Q8H  . glycopyrrolate  0.1 mg Intravenous TID  . insulin aspart  0-9 Units Subcutaneous Q4H  . methylPREDNISolone (SOLU-MEDROL) injection  40 mg Intravenous Q12H  . midodrine  5 mg Per Tube BID WC  . pantoprazole sodium  40 mg Per Tube QHS  . sodium chloride flush  10-40 mL Intracatheter Q12H  . sulfamethoxazole-trimethoprim  1 tablet Per Tube Once per day on Mon Wed Fri   Continuous Infusions: . dextrose 5 % and 0.9% NaCl Stopped (08/23/20 1805)  . feeding supplement (OSMOLITE 1.5 CAL) 55 mL/hr at 08/26/20 0550  . levETIRAcetam 1,500 mg (08/25/20 2100)     LOS: 18 days   Time spent: 54min  Burnard Enis C Ahmir Bracken, DO [facility]  If 7PM-7AM, please contact night-coverage [email or web address]  08/26/2020, 8:20 AM

## 2020-08-26 NOTE — Plan of Care (Signed)
  Problem: Clinical Measurements: Goal: Will remain free from infection Outcome: Progressing   Problem: Clinical Measurements: Goal: Respiratory complications will improve Outcome: Progressing   Problem: Pain Managment: Goal: General experience of comfort will improve Outcome: Progressing   Problem: Safety: Goal: Ability to remain free from injury will improve Outcome: Progressing   Problem: Skin Integrity: Goal: Risk for impaired skin integrity will decrease Outcome: Progressing

## 2020-08-26 NOTE — Progress Notes (Signed)
[facility] ([facility]) Community Based Palliative Care   °    °This patient is enrolled in our palliative care services in the community.  [facility] will continue to follow for any discharge planning needs and to coordinate continuation of palliative care.   °If you have questions or need assistance, please call [phone] or contact the hospital Liaison listed on AMION.    ° °Thank you for the opportunity to participate in this patient’s care. °    °Chrislyn King, BSN, RN °[facility] Hospital Liaison   °[phone] (24h on call) ° °

## 2020-08-26 NOTE — Progress Notes (Signed)
Patient's mother, [name], called for update on patient's condition. She stated she was pleased to hear that her son is more alert today than the previous evening. Will continue to monitor patient and update family as needed.

## 2020-08-27 DIAGNOSIS — R569 Unspecified convulsions: Secondary | ICD-10-CM | POA: Diagnosis not present

## 2020-08-27 LAB — GLUCOSE, CAPILLARY
Glucose-Capillary: 149 mg/dL — ABNORMAL HIGH (ref 70–99)
Glucose-Capillary: 126 mg/dL — ABNORMAL HIGH (ref 70–99)
Glucose-Capillary: 112 mg/dL — ABNORMAL HIGH (ref 70–99)
Glucose-Capillary: 149 mg/dL — ABNORMAL HIGH (ref 70–99)
Glucose-Capillary: 134 mg/dL — ABNORMAL HIGH (ref 70–99)

## 2020-08-27 NOTE — Progress Notes (Signed)
Updated patient's mom thrice this  Shift regarding his  status. Patient has been more alert and  Conversive.  Pt's mom  was too worried that her son was not able to identify her and husband on 08/26/2020 during their visitation.

## 2020-08-27 NOTE — Plan of Care (Signed)
  Problem: Clinical Measurements: Goal: Cardiovascular complication will be avoided Outcome: Progressing  Telemetry assessed for possible st segment changes, ekg normal, md notified. Patient resting, easy to arouse.

## 2020-08-27 NOTE — Progress Notes (Signed)
PROGRESS NOTE    Cameron Hale  YIF:[id number] DOB: 1976-08-07 DOA: 08/08/2020 PCP: Alroy Dust, L.Marlou Sa, MD   Brief Narrative:   Cameron Hale is a 44 y.o. male with medical history significant of type 2 neurofibromatosis and seizures. Patient was noted to have a generalized seizure prior to admission, consistent in blank staring and being unresponsive.  Through the course of the day he returned to his baseline.  The patient subsequently became much more somnolent, lethargic, and unresponsive.  Patient subsequently transported to [facility] via EMS. On route he had persistent hypotension and he was brought to [facility] emergency department.  Family indicates patient had neurosurgery intervention in March 2021, post surgery he developed functional quadriplegia, blindness and hearing loss, the only full neurologic function preserved is talking. He was discharged August 2021 home with his parents.  He needs assistance with this basic activities of daily living including bathing and eating.  He does have a decubitus pressure ulcer stage II. He was diagnosed with urinary tract infection about 7 days ago and he had ciprofloxacin prescribed.  In the ED patient noted to be unresponsive, his CT head with advanced meningiomatosis, unchanged vasogenic edema.  No acute bleeding.  Arterial blood gas analysis with normal pH.  Chest radiograph with signs of aspiration pneumonia.  [facility] was contacted, unfortunately unable to perform ED to ED transfer. Patient received intravenous fluids, dexamethasone, 2 g of Keppra, 2 mg IV lorazepam, aztreonam, vancomycin, cefepime and metronidazole. Referred for admission for further evaluation.   9/25 worsening acute hypoxia, patient subsequently intubated,right chest tube x2 9/30 chest tubes were removed 10/4 extubated  10/5 weaned off oxygen, currently on RA 10/6 coretrak placed 10/7 weaned off levophed; mild hypothermia overnight - improving with  bairhugger 10/8 stable, continue speech evaluation family requesting reevaluation and consideration for advancement of diet as they are hesitant to place PEG tube 10/9 speech continues to recommend n.p.o. status with minimal p.o. challenges over the weekend with ice chips/similar.  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 Needs PJP prophylaxis with Bactrim until he is safely staying 20mg  prednisone equivalent daily.  Acute on chronic dysphagia, ongoing - Cortrak and enteral feeds ongoing - Family indicates he has had a chronic issues with dysphagia in the past and was able to be rehabilitate to resume diet at home after speech therapy, we have asked speech therapy to reevaluate the patient today - Family quite hesitant to discuss PEG tube placement any further at this point without further speech evaluation, we discussed that patient will need to be able to tolerate enough calories and free water to maintain volume status and nutrition status as he continues physical therapy and  especially if they wish to continue chemotherapy in the near future.  They indicate that he has overcome swallowing  difficulties before and hold out hope he will be able to tolerate p.o. safely in the near future.  Acute Metabolic Encephalopathy,  resolving New ischemic infarcts 9/26 - Baseline able to speak but blind in 1 eye and hard of hearing, functionally quadriplegic -currently ANO x4, appears to be back to baseline per father at bedside  - Seizures ruled out by repeated EEGs.   - Possible component of cefepime related encephalopathy- cefepime stopped 9/25.   - Bilateral ACA strokes likely due to mass effect of patient's meningiomatosis.  - Also has progressive neurofibromatosis type II of brain and spinal cord.    R Pleural Effusion, resolved - Suspect benign transudate from poor oncotic pressure, resolved - Extubated 10/4, tolerating well. Has remained stable and is off of [place]  - Continue at bedtime, as needed BiPAP for apneas - Solumedrol for pneumonitis-- plan for 6 week taper  - PJP prophylaxis while on steroids with Bactrim  - If requires reintubation, would proceed with trach   Hypernatremia, improving Hypokalemia Hypomagnesemia  -free water 100 ml  q8 hr (decr to q8 from q6 on 10/4) -follow electrolytes, replace as indicated  Hypo / Hyperglycemia -SSI  -glucose goal 140-180   Labile blood pressures-- improved - Suspect autonomic instability related to heavy NF2 burden on spinal cord. -Continue midodrine 5 mg BID  Neurofibromatosis Type 2 -Chronic, progressive, treatment (brigatinib) currently on hold.  Followed at [facility] by Dr. Clovis Riley.  -Family hoping to have follow-up with [facility] in the next few weeks after discharge  Seizure disorder  -Keppra ongoing, remains seizure-free at this point  Microcytic Hypochromic Anemia -MVI  -follow CBC -transfuse for Hgb <7%  Prediabetic A1c 6.1 -SSI ongoing, hypoglycemic protocol as needed  Sacral Decubitus -wound care per nursing   Severe Protein Calorie Malnutrition  -Discontinue IV fluids, continue NG tube feedings  and free water flushes per dietary -SLP following, dietary following  DVT prophylaxis: lovenox Code Status: Full Family Communication: Father at bedside  Status is: Inpatient  Dispo: The patient is from: Home              Anticipated d/c is to: To be determined              Anticipated d/c date is: To be determined, pending safe p.o. intake and NG tube removal versus PEG tube placement at this point              Patient currently not medically stable for discharge  Consultants:   PCCM, hospice  Antimicrobials:  Vanc 9/21 >> 9/24 Aztreonam 9/21 Flagyl 9/21 >> 9/25 Cefepime 9/21 >> 9/25 Ceftriaxone 9/26 >> 9/29 Bactrim 10/1 >> ongoing  Subjective: No acute issues or events overnight, family not present today at bedside, patient and I discussed ongoing attempts for advancing diet and speech, otherwise no acute issues denies any other symptoms.  Objective: Vitals:   08/26/20 2337 08/27/20 0326 08/27/20 0544 08/27/20 0652  BP: 140/88 132/78    Pulse: 85 79    Resp: 15 17    Temp: 98.4 F (36.9 C) 98 F (36.7 C)    TempSrc: Oral Oral    SpO2: 100% 100% 99% 99%  Weight:      Height:        Intake/Output Summary (Last 24 hours) at 08/27/2020 0814 Last data filed at 08/27/2020 0652 Gross per 24 hour  Intake 1115  ml  Output 2650 ml  Net -1535 ml   Filed Weights   08/21/20 0500  Weight: 72.4 kg    Examination:  General: Chronically ill appearing gentleman, resting comfortably in bed in NAD HEENT: Oval/AT, MM pink/moist, PERRL, core track in place and right nare Neuro: Alert and appears to be at near baseline, answering yes or no to simple questions CV: s1s2 regular rate and rhythm, no murmur, rubs, or gallops,  PULM:   Scant end expiratory wheeze right apex otherwise without rhonchi or rales GI: soft, bowel sounds active in all 4 quadrants, non-tender, non-distended Extremities: warm/dry, no edema  Skin: no rashes or lesions, slightly pale    Data Reviewed: I  have personally reviewed following labs and imaging studies  CBC: Recent Labs  Lab 08/21/20 0445 08/21/20 0445 08/22/20 0447 08/22/20 1854 08/23/20 0407 08/24/20 0416 08/26/20 0530  WBC 14.6*  --  15.0*  --  12.5* 9.3 7.9  HGB 7.9*   < > 7.8* 7.5* 7.1* 7.1* 7.5*  HCT 23.9*   < > 24.4* 22.0* 22.7* 23.2* 25.0*  MCV 79.9*  --  80.8  --  82.8 84.4 86.2  PLT 340  --  372  --  421* 346 372   < > = values in this interval not displayed.   Basic Metabolic Panel: Recent Labs  Lab 08/21/20 0445 08/21/20 0445 08/22/20 0447 08/22/20 1854 08/23/20 0407 08/24/20 0416 08/26/20 0530  NA 142   < > 144 143 147* 149* 148*  K 3.7   < > 4.4 4.0 4.0 4.0 4.7  CL 109  --  111  --  115* 117* 114*  CO2 27  --  24  --  21* 24 26  GLUCOSE 149*  --  84  --  102* 89 159*  BUN 42*  --  42*  --  38* 34* 32*  CREATININE 0.34*  --  <0.30*  --  0.42* 0.45* 0.31*  CALCIUM 8.3*  --  8.4*  --  8.4* 8.4* 8.3*   < > = values in this interval not displayed.   GFR: Estimated Creatinine Clearance: 119.4 mL/min (A) (by C-G formula based on SCr of 0.31 mg/dL (L)). Liver Function Tests: Recent Labs  Lab 08/21/20 0445 08/22/20 0447  AST 34 27  ALT 93* 91*  ALKPHOS 72 69  BILITOT 0.4 0.4  PROT 4.6* 4.7*  ALBUMIN 1.9* 2.0*   No results for input(s): LIPASE, AMYLASE in the last 168 hours. No results for input(s): AMMONIA in the last 168 hours. Coagulation Profile: No results for input(s): INR, PROTIME in the last 168 hours. Cardiac Enzymes: No results for input(s): CKTOTAL, CKMB, CKMBINDEX, TROPONINI in the last 168 hours. BNP (last 3 results) No results for input(s): PROBNP in the last 8760 hours. HbA1C: No results for input(s): HGBA1C in the last 72 hours. CBG: Recent Labs  Lab 08/26/20 1125 08/26/20 1526 08/26/20 1945 08/26/20 2335 08/27/20 0325  GLUCAP 110* 142* 132* 132* 149*   Lipid Profile: No results for input(s): CHOL, HDL, LDLCALC, TRIG, CHOLHDL, LDLDIRECT in the last 72  hours. Thyroid Function Tests: No results for input(s): TSH, T4TOTAL, FREET4, T3FREE, THYROIDAB in the last 72 hours. Anemia Panel: No results for input(s): VITAMINB12, FOLATE, FERRITIN, TIBC, IRON, RETICCTPCT in the last 72 hours. Sepsis Labs: No results for input(s): PROCALCITON, LATICACIDVEN in the last 168 hours.  Recent Results (from the past 240 hour(s))  Culture, blood (Routine X 2) w Reflex to ID Panel  Status: None   Collection Time: 08/20/20  7:47 PM   Specimen: BLOOD LEFT HAND  Result Value Ref Range Status   Specimen Description BLOOD LEFT HAND  Final   Special Requests   Final    BOTTLES DRAWN AEROBIC AND ANAEROBIC Blood Culture adequate volume   Culture   Final    NO GROWTH 5 DAYS Performed at [facility], [address], [place], [place] [address]    Report Status 08/25/2020 FINAL  Final  Culture, blood (Routine X 2) w Reflex to ID Panel     Status: None   Collection Time: 08/21/20  2:42 AM   Specimen: BLOOD LEFT HAND  Result Value Ref Range Status   Specimen Description BLOOD LEFT HAND  Final   Special Requests   Final    BOTTLES DRAWN AEROBIC ONLY Blood Culture adequate volume   Culture   Final    NO GROWTH 5 DAYS Performed at [facility], [place] [address], [place], [place] [address]    Report Status 08/26/2020 FINAL  Final  Culture, Urine     Status: None   Collection Time: 08/21/20 11:35 AM   Specimen: Urine, Catheterized  Result Value Ref Range Status   Specimen Description URINE, CATHETERIZED  Final   Special Requests Normal  Final   Culture   Final    NO GROWTH Performed at [place] [facility] [address] [place], [place] [address]    Report Status 08/22/2020 FINAL  Final     Radiology Studies: No results found.  Scheduled Meds: . aspirin  81 mg Per Tube Daily  . chlorhexidine  15 mL Mouth Rinse BID  . Chlorhexidine Gluconate Cloth  6 each Topical Daily  . collagenase   Topical BID  . enoxaparin (LOVENOX) injection  40 mg  Subcutaneous Q24H  . feeding supplement (PROSource TF)  45 mL Per Tube TID  . free water  100 mL Per Tube Q8H  . glycopyrrolate  0.1 mg Intravenous TID  . insulin aspart  0-9 Units Subcutaneous Q4H  . methylPREDNISolone (SOLU-MEDROL) injection  40 mg Intravenous Q12H  . midodrine  5 mg Per Tube BID WC  . pantoprazole sodium  40 mg Per Tube QHS  . sodium chloride flush  10-40 mL Intracatheter Q12H  . sulfamethoxazole-trimethoprim  1 tablet Per Tube Once per day on Mon Wed Fri   Continuous Infusions: . dextrose 5 % and 0.9% NaCl Stopped (08/23/20 1805)  . feeding supplement (OSMOLITE 1.5 CAL) 1,000 mL (08/27/20 0428)  . levETIRAcetam Stopped (08/26/20 2059)     LOS: 19 days   Time spent: 36min  Cameron [place] C Mallarie Voorhies, DO [facility]  If 7PM-7AM, please contact night-coverage [email or web address]  08/27/2020, 8:14 AM

## 2020-08-28 DIAGNOSIS — R569 Unspecified convulsions: Secondary | ICD-10-CM | POA: Diagnosis not present

## 2020-08-28 LAB — BASIC METABOLIC PANEL
Anion gap: 6 (ref 5–15)
BUN: 26 mg/dL — ABNORMAL HIGH (ref 6–20)
CO2: 28 mmol/L (ref 22–32)
Calcium: 8.2 mg/dL — ABNORMAL LOW (ref 8.9–10.3)
Chloride: 103 mmol/L (ref 98–111)
Creatinine, Ser: <0.3 mg/dL — ABNORMAL LOW (ref 0.61–1.24)
Glucose, Bld: 169 mg/dL — ABNORMAL HIGH (ref 70–99)
Potassium: 4.6 mmol/L (ref 3.5–5.1)
Sodium: 137 mmol/L (ref 135–145)

## 2020-08-28 LAB — GLUCOSE, CAPILLARY
Glucose-Capillary: 126 mg/dL — ABNORMAL HIGH (ref 70–99)
Glucose-Capillary: 134 mg/dL — ABNORMAL HIGH (ref 70–99)
Glucose-Capillary: 135 mg/dL — ABNORMAL HIGH (ref 70–99)
Glucose-Capillary: 138 mg/dL — ABNORMAL HIGH (ref 70–99)
Glucose-Capillary: 141 mg/dL — ABNORMAL HIGH (ref 70–99)
Glucose-Capillary: 159 mg/dL — ABNORMAL HIGH (ref 70–99)
Glucose-Capillary: 162 mg/dL — ABNORMAL HIGH (ref 70–99)

## 2020-08-28 LAB — CBC
HCT: 23.5 % — ABNORMAL LOW (ref 39.0–52.0)
Hemoglobin: 7.4 g/dL — ABNORMAL LOW (ref 13.0–17.0)
MCH: 26.5 pg (ref 26.0–34.0)
MCHC: 31.5 g/dL (ref 30.0–36.0)
MCV: 84.2 fL (ref 80.0–100.0)
Platelets: 328 10*3/uL (ref 150–400)
RBC: 2.79 MIL/uL — ABNORMAL LOW (ref 4.22–5.81)
RDW: 19 % — ABNORMAL HIGH (ref 11.5–15.5)
WBC: 8.6 10*3/uL (ref 4.0–10.5)
nRBC: 0 % (ref 0.0–0.2)

## 2020-08-28 NOTE — Progress Notes (Signed)
PROGRESS NOTE    Cameron Hale  KAJ:[id number] DOB: [birth year] DOA: 08/08/2020 PCP: Alroy Dust, L.Marlou Sa, MD   Brief Narrative:   Cameron Hale is a 44 y.o. male with medical history significant of type 2 neurofibromatosis and seizures. Patient was noted to have a generalized seizure prior to admission, consistent in blank staring and being unresponsive.  Through the course of the day he returned to his baseline.  The patient subsequently became much more somnolent, lethargic, and unresponsive.  Patient subsequently transported to [name] via EMS. On route he had persistent hypotension and he was brought to [facility] emergency department.  Family indicates patient had neurosurgery intervention in March 2021, post surgery he developed functional quadriplegia, blindness and hearing loss, the only full neurologic function preserved is talking. He was discharged August 2021 home with his parents.  He needs assistance with this basic activities of daily living including bathing and eating.  He does have a decubitus pressure ulcer stage II. He was diagnosed with urinary tract infection about 7 days ago and he had ciprofloxacin prescribed.  In the ED patient noted to be unresponsive, his CT head with advanced meningiomatosis, unchanged vasogenic edema.  No acute bleeding.  Arterial blood gas analysis with normal pH.  Chest radiograph with signs of aspiration pneumonia.  [facility] was contacted, unfortunately unable to perform ED to ED transfer. Patient received intravenous fluids, dexamethasone, 2 g of Keppra, 2 mg IV lorazepam, aztreonam, vancomycin, cefepime and metronidazole. Referred for admission for further evaluation.   9/25 worsening acute hypoxia, patient subsequently intubated,right chest tube x2 9/30 chest tubes were removed 10/4 extubated  10/5 weaned off oxygen, currently on RA 10/6 coretrak placed 10/7 weaned off levophed; mild hypothermia overnight - improving with  bairhugger 10/8 stable, continue speech evaluation family requesting reevaluation and consideration for advancement of diet as they are hesitant to place PEG tube 10/9 to present: speech continues to recommend n.p.o. status with minimal p.o. challenges with ice chips. Lengthy discussion with family at bedside daily about likely need to transition to PEG given profound weakness and high caloric/free water needs well above current ability and likely well above anything Sha will be able to tolerate in the next few days to weeks (if he ever improves back to previous baseline). Family continues to be adamant that they aren't giving up on him taking PO - we discussed a PEG tube wouldn't limit his ability to interact/advance with SLP. Continue to impress upon them the importance of likely needing PEG tube in the near future.  Assessment & Plan:   Principal Problem:   Seizures ([place]) Active Problems:   Paraparesis (HCC)   Mixed conductive and sensorineural hearing loss of right ear with unrestricted hearing of left ear   Quadriplegia (HCC)   AMS (altered mental status)   Aspiration pneumonia (HCC)   Neurofibromatosis 2 ([place])   Acute respiratory failure with hypoxia (HCC)   Cerebral thrombosis with cerebral infarction   DNR (do not resuscitate) discussion   History of ETT   Palliative care by specialist   Acute respiratory failure with hypoxia secondary to drug induced pneumonitis due to previous mAb NF treatment as OP, resolved - Not thought to be infectious, does not meet sepsis criteria - Extubated 10/4 - Discussed with his mother again today that he remains high risk for aspiration, pneumonitis and recurrent pneumonias - Wean steroids slowly over 6 weeks. Needs PJP prophylaxis with Bactrim until he is safely staying 20mg  prednisone equivalent daily.  Acute  on chronic dysphagia, ongoing - Cortrak and enteral feeds ongoing - Family indicates he has had a chronic issues with dysphagia in the  past and was able to be rehabilitate to resume diet at home after speech therapy, we have asked speech therapy to reevaluate the patient today - Family quite hesitant to discuss PEG tube placement any further at this point without further speech evaluation, we discussed that patient will need to be able to tolerate enough calories and free water to maintain volume status and nutrition status as he continues physical therapy and especially if they wish to continue chemotherapy in the near future.  They indicate that he has overcome swallowing difficulties before and hold out hope he will be able to tolerate p.o. safely in the near future.  Acute Metabolic Encephalopathy,  resolved New ischemic infarcts 9/26 - Baseline able to speak but blind in 1 eye and hard of hearing, functionally quadriplegic -currently ANO x4, appears to be back to baseline per father at bedside  - Seizures ruled out by repeated EEGs.   - Possible component of cefepime related encephalopathy- cefepime stopped 9/25.   - Bilateral ACA strokes likely due to mass effect of patient's meningiomatosis.  - Also has progressive neurofibromatosis type II of brain and spinal cord.    R Pleural Effusion, resolved - Suspect benign transudate from poor oncotic pressure, resolved - Extubated 10/4, tolerating well. Has remained stable and is off of   - Continue at bedtime, as needed BiPAP for apneas - Solumedrol for pneumonitis-- plan for 6 week taper  - PJP prophylaxis while on steroids with Bactrim  - If requires reintubation, would proceed with trach   Hypernatremia, improving Hypokalemia Hypomagnesemia  -Continue tube feeds/free water flushes - well controlled currently  Hypo / Hyperglycemia -SSI  -glucose goal 140-180   Labile blood pressures-- improved - Suspect autonomic instability related to heavy NF2 burden on spinal cord. -Continue midodrine 5 mg BID  Neurofibromatosis Type 2 -Chronic, progressive, treatment  (brigatinib) currently on hold.  Followed at [facility] by Dr. Clovis Riley.  -Family hoping to have follow-up with [facility] in the next few weeks after discharge  Seizure disorder  -Keppra ongoing, remains seizure-free at this point  Microcytic Hypochromic Anemia -MVI  -follow CBC -transfuse for Hgb <7%  Prediabetic A1c 6.1 -SSI ongoing, hypoglycemic protocol as needed  Sacral Decubitus -wound care per nursing   Severe Protein Calorie Malnutrition  -Discontinue IV fluids, continue NG tube feedings and free water flushes per dietary -SLP following, dietary following  DVT prophylaxis: lovenox Code Status: Full Family Communication: Father at bedside  Status is: Inpatient  Dispo: The patient is from: Home              Anticipated d/c is to: To be determined              Anticipated d/c date is: To be determined, pending safe p.o. intake and NG tube removal versus PEG tube placement at this point              Patient currently not medically stable for discharge  Consultants:   PCCM, hospice  Antimicrobials:  Vanc 9/21 >> 9/24 Aztreonam 9/21 Flagyl 9/21 >> 9/25 Cefepime 9/21 >> 9/25 Ceftriaxone 9/26 >> 9/29 Bactrim 10/1 >> ongoing  Subjective: No acute issues or events overnight, family not present today at bedside, patient and I discussed ongoing attempts for advancing diet and speech, otherwise no acute issues denies any other symptoms.  Objective: Vitals:   08/27/20  2000 08/28/20 0000 08/28/20 0400 08/28/20 0750  BP: 126/82 135/86 119/75 120/74  Pulse: 74 75 77 75  Resp: 14 18 17 18   Temp: 97.8 F (36.6 C) 97.7 F (36.5 C) 97.8 F (36.6 C) 97.8 F (36.6 C)  TempSrc: Oral Oral Oral Oral  SpO2: 99% 100% 97% 96%  Weight:      Height:        Intake/Output Summary (Last 24 hours) at 08/28/2020 0814 Last data filed at 08/28/2020 0555 Gross per 24 hour  Intake 808.46 ml  Output 3500 ml  Net -2691.54 ml   Filed Weights   08/21/20 0500  Weight: 72.4 kg     Examination:  General: Chronically ill appearing gentleman, resting comfortably in bed in NAD HEENT: [place]/AT, MM pink/moist, PERRL, core track in place and right nare Neuro: Alert and appears to be at near baseline, answering yes or no to simple questions CV: s1s2 regular rate and rhythm, no murmur, rubs, or gallops,  PULM:   Scant end expiratory wheeze right apex otherwise without rhonchi or rales GI: soft, bowel sounds active in all 4 quadrants, non-tender, non-distended Extremities: warm/dry, no edema  Skin: no rashes or lesions, slightly pale    Data Reviewed: I have personally reviewed following labs and imaging studies  CBC: Recent Labs  Lab 08/22/20 0447 08/22/20 0447 08/22/20 1854 08/23/20 0407 08/24/20 0416 08/26/20 0530 08/28/20 0520  WBC 15.0*  --   --  12.5* 9.3 7.9 8.6  HGB 7.8*   < > 7.5* 7.1* 7.1* 7.5* 7.4*  HCT 24.4*   < > 22.0* 22.7* 23.2* 25.0* 23.5*  MCV 80.8  --   --  82.8 84.4 86.2 84.2  PLT 372  --   --  421* 346 372 328   < > = values in this interval not displayed.   Basic Metabolic Panel: Recent Labs  Lab 08/22/20 0447 08/22/20 0447 08/22/20 1854 08/23/20 0407 08/24/20 0416 08/26/20 0530 08/28/20 0520  NA 144   < > 143 147* 149* 148* 137  K 4.4   < > 4.0 4.0 4.0 4.7 4.6  CL 111  --   --  115* 117* 114* 103  CO2 24  --   --  21* 24 26 28   GLUCOSE 84  --   --  102* 89 159* 169*  BUN 42*  --   --  38* 34* 32* 26*  CREATININE <0.30*  --   --  0.42* 0.45* 0.31* <0.30*  CALCIUM 8.4*  --   --  8.4* 8.4* 8.3* 8.2*   < > = values in this interval not displayed.   GFR: CrCl cannot be calculated (This lab value cannot be used to calculate CrCl because it is not a number: <0.30). Liver Function Tests: Recent Labs  Lab 08/22/20 0447  AST 27  ALT 91*  ALKPHOS 69  BILITOT 0.4  PROT 4.7*  ALBUMIN 2.0*   No results for input(s): LIPASE, AMYLASE in the last 168 hours. No results for input(s): AMMONIA in the last 168 hours. Coagulation  Profile: No results for input(s): INR, PROTIME in the last 168 hours. Cardiac Enzymes: No results for input(s): CKTOTAL, CKMB, CKMBINDEX, TROPONINI in the last 168 hours. BNP (last 3 results) No results for input(s): PROBNP in the last 8760 hours. HbA1C: No results for input(s): HGBA1C in the last 72 hours. CBG: Recent Labs  Lab 08/27/20 1605 08/27/20 1956 08/28/20 0105 08/28/20 0515 08/28/20 0747  GLUCAP 149* 134* 135* 162* 141*  Lipid Profile: No results for input(s): CHOL, HDL, LDLCALC, TRIG, CHOLHDL, LDLDIRECT in the last 72 hours. Thyroid Function Tests: No results for input(s): TSH, T4TOTAL, FREET4, T3FREE, THYROIDAB in the last 72 hours. Anemia Panel: No results for input(s): VITAMINB12, FOLATE, FERRITIN, TIBC, IRON, RETICCTPCT in the last 72 hours. Sepsis Labs: No results for input(s): PROCALCITON, LATICACIDVEN in the last 168 hours.  Recent Results (from the past 240 hour(s))  Culture, blood (Routine X 2) w Reflex to ID Panel     Status: None   Collection Time: 08/20/20  7:47 PM   Specimen: BLOOD LEFT HAND  Result Value Ref Range Status   Specimen Description BLOOD LEFT HAND  Final   Special Requests   Final    BOTTLES DRAWN AEROBIC AND ANAEROBIC Blood Culture adequate volume   Culture   Final    NO GROWTH 5 DAYS Performed at [facility], [address] [place], [place] [address]    Report Status 08/25/2020 FINAL  Final  Culture, blood (Routine X 2) w Reflex to ID Panel     Status: None   Collection Time: 08/21/20  2:42 AM   Specimen: BLOOD LEFT HAND  Result Value Ref Range Status   Specimen Description BLOOD LEFT HAND  Final   Special Requests   Final    BOTTLES DRAWN AEROBIC ONLY Blood Culture adequate volume   Culture   Final    NO GROWTH 5 DAYS Performed at [facility], [place] [address], [place], [place] [address]    Report Status 08/26/2020 FINAL  Final  Culture, Urine     Status: None   Collection Time: 08/21/20 11:35 AM   Specimen:  Urine, Catheterized  Result Value Ref Range Status   Specimen Description URINE, CATHETERIZED  Final   Special Requests Normal  Final   Culture   Final    NO GROWTH Performed at [place] [address] [place],  [address]    Report Status 08/22/2020 FINAL  Final     Radiology Studies: No results found.  Scheduled Meds: . aspirin  81 mg Per Tube Daily  . chlorhexidine  15 mL Mouth Rinse BID  . Chlorhexidine Gluconate Cloth  6 each Topical Daily  . collagenase   Topical BID  . enoxaparin (LOVENOX) injection  40 mg Subcutaneous Q24H  . feeding supplement (PROSource TF)  45 mL Per Tube TID  . free water  100 mL Per Tube Q8H  . glycopyrrolate  0.1 mg Intravenous TID  . insulin aspart  0-9 Units Subcutaneous Q4H  . methylPREDNISolone (SOLU-MEDROL) injection  40 mg Intravenous Q12H  . midodrine  5 mg Per Tube BID WC  . pantoprazole sodium  40 mg Per Tube QHS  . sodium chloride flush  10-40 mL Intracatheter Q12H  . sulfamethoxazole-trimethoprim  1 tablet Per Tube Once per day on Mon Wed Fri   Continuous Infusions: . dextrose 5 % and 0.9% NaCl 50 mL/hr at 08/28/20 0555  . feeding supplement (OSMOLITE 1.5 CAL) 1,000 mL (08/28/20 0108)  . levETIRAcetam 1,500 mg (08/27/20 2003)     LOS: 20 days   Time spent: 1min  Zyniah Ferraiolo C Normand Damron, DO [facility]  If 7PM-7AM, please contact night-coverage [email or web address]  08/28/2020, 8:14 AM

## 2020-08-28 NOTE — Progress Notes (Signed)
°  Speech Language Pathology Treatment: Dysphagia  Patient Details Name: Cameron Hale [name]: [id number] DOB: 1976/04/21 Today's Date: 08/28/2020 Time: 8099-8338 SLP Time Calculation (min) (ACUTE ONLY): 19 min  Assessment / Plan / Recommendation Clinical Impression  Pt was seen for ongoing dysphagia tx. He mostly kept his eyes closed during session and did not respond verbally, but would reply appropriately with head nods. Vocal quality could not be assessed for potentially persistent dysphonia s/p intubation this admission. Oral care was performed before ice chips were administered. No overt s/s of aspiration were noted, but pt also did not cough to command despite cues throughout session (although he did follow other commands given). SLP also provided cues for use of effortful swallows to utilize swallowing musculature. Will continue to follow for dysphagia tx.   HPI HPI: Cameron Hale is a 44 y.o. male with history of neurofibromatosis type II with extensive cervical neuromas-underwent surgery March 2021 and unfortunately complicated by postoperative infection and quadriplegia, seizures on keppra, blind OS w/ impaired eye movements, [facility]22 with altered mental status. Developed hypoglycemia, hypotension and increased WOB. Presents with acute hypoxic respiratory failure due to acute encephalopathy, acute strokes, chronic weakness from quadriplegia and neurofibromatosis, and acute medication induced pneumonitis. Pt intubated 9/25-10/4.  FEES 10/5-  severe, diffuse weakness across oral and pharyngeal musculature leading to delayed oral transit, spillage to pyriforms prior to the swallow and then severe residue after the swallow. Pt has bilateral errythema and indentations on posterior portion of the vocal folds. There is gradual silent aspiration of residue via the posterior commissure.       SLP Plan  Continue with current plan of care       Recommendations   Diet recommendations: NPO (except for a few ice chips after oral care) Medication Administration: Via alternative means                Oral Care Recommendations: Oral care QID Follow up Recommendations: Home health SLP;24 hour supervision/assistance SLP Visit Diagnosis: Dysphagia, oropharyngeal phase (R13.12) Plan: Continue with current plan of care       GO                [name], M.A. [facility] Pager [phone] Office [phone]  08/28/2020, 2:54 PM

## 2020-08-28 NOTE — Progress Notes (Signed)
°   08/28/20 0950  Clinical Encounter Type  Visited With Patient  Visit Type Follow-up  Referral From Family (Ms. Truman Hayward)  Consult/Referral To Chaplain   Chaplain responded to request from patient's family, Ms. Truman Hayward, to have an AD notorized between 78 and 10 am Monday. Pt was alone in room, no family at bedside. Chaplain asked Pt if he would like an AD and Pt appeared to shake his head to negative, twice. Chaplain remains available as needed for follow-up.  This note was prepared by Chaplain Resident, Dante Gang, MDiv. For questions, please contact by phone at [phone].

## 2020-08-29 DIAGNOSIS — R569 Unspecified convulsions: Secondary | ICD-10-CM | POA: Diagnosis not present

## 2020-08-29 LAB — GLUCOSE, CAPILLARY
Glucose-Capillary: 131 mg/dL — ABNORMAL HIGH (ref 70–99)
Glucose-Capillary: 140 mg/dL — ABNORMAL HIGH (ref 70–99)
Glucose-Capillary: 142 mg/dL — ABNORMAL HIGH (ref 70–99)
Glucose-Capillary: 152 mg/dL — ABNORMAL HIGH (ref 70–99)
Glucose-Capillary: 154 mg/dL — ABNORMAL HIGH (ref 70–99)
Glucose-Capillary: 175 mg/dL — ABNORMAL HIGH (ref 70–99)

## 2020-08-29 NOTE — Progress Notes (Signed)
PROGRESS NOTE    Cameron Hale  EUM:[id number] DOB: 1976-05-04 DOA: 08/08/2020 PCP: Cameron Hale, Cameron Sa, MD   Brief Narrative:   Cameron Hale is a 44 y.o. male with medical history significant of type 2 neurofibromatosis and seizures. Patient was noted to have a generalized seizure prior to admission, consistent in blank staring and being unresponsive.  Through the course of the day he returned to his baseline.  The patient subsequently became much more somnolent, lethargic, and unresponsive.  Patient subsequently transported to [facility] via EMS. On route he had persistent hypotension and he was brought to [facility] emergency department.  Family indicates patient had neurosurgery intervention in March 2021, post surgery he developed functional quadriplegia, blindness and hearing loss, the only full neurologic function preserved is talking. He was discharged August 2021 home with his parents.  He needs assistance with this basic activities of daily living including bathing and eating.  He does have a decubitus pressure ulcer stage II. He was diagnosed with urinary tract infection about 7 days ago and he had ciprofloxacin prescribed.  In the ED patient noted to be unresponsive, his CT head with advanced meningiomatosis, unchanged vasogenic edema.  No acute bleeding.  Arterial blood gas analysis with normal pH.  Chest radiograph with signs of aspiration pneumonia.  Cameron Hale was contacted, unfortunately unable to perform ED to ED transfer. Patient received intravenous fluids, dexamethasone, 2 g of Cameron Hale, 2 mg IV lorazepam, aztreonam, vancomycin, cefepime and metronidazole. Referred for admission for further evaluation.   9/25 worsening acute hypoxia, patient subsequently intubated,right chest tube x2 9/30 chest tubes were removed 10/4 extubated  10/5 weaned off oxygen, currently on RA 10/6 coretrak placed 10/7 weaned off Cameron Hale; mild hypothermia overnight - improving with  Cameron Hale 10/8 stable, continue speech evaluation family requesting reevaluation and consideration for advancement of diet as they are hesitant to place PEG tube 10/9 to present: speech continues to recommend n.p.o. status with minimal p.o. challenges with ice chips. Lengthy discussion with family at bedside daily about likely need to transition to PEG given profound weakness and high caloric/free water needs well above current ability and likely well above anything Cameron Hale will be able to tolerate in the next few days to weeks (if he ever improves back to previous baseline). Family continues to be adamant that they aren't giving up on him taking PO - we discussed a PEG tube wouldn't limit his ability to interact/advance with SLP. Continue to impress upon them the importance of likely needing PEG tube in the near future given patient's likely slow progress to tolerate adequate p.o. intake.  Assessment & Plan:   Principal Problem:   Seizures (Cameron Hale) Active Problems:   Paraparesis (Cameron Hale)   Mixed conductive and sensorineural hearing loss of right ear with unrestricted hearing of left ear   Quadriplegia (Cameron Hale)   AMS (altered mental status)   Aspiration pneumonia (Cameron Hale)   Neurofibromatosis 2 (Cameron Hale)   Acute respiratory failure with hypoxia (Cameron Hale)   Cerebral thrombosis with cerebral infarction   DNR (do not resuscitate) discussion   History of ETT   Palliative care by specialist   Acute respiratory failure with hypoxia secondary to drug induced pneumonitis due to previous mAb NF treatment as OP, resolved - Not thought to be infectious, does not meet sepsis criteria - Extubated 10/4 - Discussed with his mother again today that he remains high risk for aspiration, pneumonitis and recurrent pneumonias - Wean steroids slowly over 6 weeks. Needs Cameron Hale prophylaxis with Cameron Hale until  he is safely staying 20mg  prednisone equivalent daily.  Acute on chronic dysphagia, ongoing - Cortrak and enteral feeds  ongoing - Family indicates he has had a chronic issues with dysphagia in the past and was able to be rehabilitate to resume diet at home after speech therapy, we have asked speech therapy to reevaluate the patient today - Family quite hesitant to discuss PEG tube placement any further at this point without further speech evaluation, we discussed that patient will need to be able to tolerate enough calories and free water to maintain volume status and nutrition status as he continues physical therapy and especially if they wish to continue chemotherapy in the near future.  They indicate that he has overcome swallowing difficulties before and hold out hope he will be able to tolerate p.o. safely in the near future.  Acute Metabolic Encephalopathy,  resolved New ischemic infarcts 9/26 - Baseline able to speak but blind in 1 eye and hard of hearing, functionally quadriplegic -currently ANO x4, appears to be back to baseline per father at bedside  - Seizures ruled out by repeated EEGs.   - Possible component of cefepime related encephalopathy- cefepime stopped 9/25.   - Bilateral ACA strokes likely due to mass effect of patient's meningiomatosis.  - Also has progressive neurofibromatosis type II of brain and spinal cord.    R Pleural Effusion, resolved - Suspect benign transudate from poor oncotic pressure, resolved - Extubated 10/4, tolerating well. Has remained stable and is off of [place]  - Continue at bedtime, as needed BiPAP for apneas - Solumedrol for pneumonitis-- plan for 6 week taper  - Cameron Hale prophylaxis while on steroids with Cameron Hale  - If requires reintubation, would proceed with trach   Hypernatremia, improving Hypokalemia Hypomagnesemia  -Continue tube feeds/free water flushes - well controlled currently  Hypo / Hyperglycemia -SSI  -glucose goal 140-180   Labile blood pressures-- improved - Suspect autonomic instability related to heavy Cameron Hale burden on spinal cord. -Continue  midodrine 5 mg BID  Neurofibromatosis Type 2 -Chronic, progressive, treatment (brigatinib) currently on hold.  Followed at [facility] by Dr. Clovis Riley.  -Family hoping to have follow-up with [facility] in the next few weeks after discharge  Seizure disorder  -Cameron Hale ongoing, remains seizure-free at this point  Microcytic Hypochromic Anemia -MVI  -follow CBC -transfuse for Hgb <7%  Prediabetic A1c 6.1 -SSI ongoing, hypoglycemic protocol as needed  Sacral Decubitus -wound care per nursing   Severe Protein Calorie Malnutrition  -Discontinue IV fluids, continue NG tube feedings and free water flushes per dietary -SLP following, dietary following  DVT prophylaxis: lovenox Code Status: Full Family Communication: Father at bedside  Status is: Inpatient  Dispo: The patient is from: Home              Anticipated d/c is to: To be determined              Anticipated d/c date is: To be determined, pending safe p.o. intake and NG tube removal versus PEG tube placement at this point              Patient currently not medically stable for discharge  Consultants:   PCCM, hospice  Antimicrobials:  Vanc 9/21 >> 9/24 Aztreonam 9/21 Flagyl 9/21 >> 9/25 Cefepime 9/21 >> 9/25 Ceftriaxone 9/26 >> 9/29 Cameron Hale 10/1 >> ongoing  Subjective: No acute issues or events overnight, family not present today at bedside, patient and I discussed ongoing attempts for advancing diet and speech, otherwise no acute issues  denies any other symptoms.  Objective: Vitals:   08/28/20 1600 08/28/20 1938 08/28/20 2334 08/29/20 0340  BP: (!) 128/95 (!) 139/91 (!) 143/98 (!) 148/96  Pulse:  88 78 77  Resp:  14 14 15   Temp: (!) 97.3 F (36.3 C) 97.7 F (36.5 C) 97.7 F (36.5 C) 98.5 F (36.9 C)  TempSrc: Oral Oral Axillary Axillary  SpO2: 96% 94% 98% 98%  Weight:      Height:        Intake/Output Summary (Last 24 hours) at 08/29/2020 0803 Last data filed at 08/29/2020 2202 Gross per 24 hour   Intake 1377.99 ml  Output 1850 ml  Net -472.01 ml   Filed Weights   08/21/20 0500  Weight: 72.4 kg    Examination:  General: Chronically ill appearing gentleman, resting comfortably in bed in NAD HEENT: [place]/AT, MM pink/moist, PERRL, core track in place and right nare Neuro: Alert and appears to be at near baseline, answering yes or no to simple questions CV: s1s2 regular rate and rhythm, no murmur, rubs, or gallops,  PULM:   Scant end expiratory wheeze right apex otherwise without rhonchi or rales GI: soft, bowel sounds active in all 4 quadrants, non-tender, non-distended Extremities: warm/dry, no edema  Skin: no rashes or lesions, slightly pale    Data Reviewed: I have personally reviewed following labs and imaging studies  CBC: Recent Labs  Lab 08/22/20 1854 08/23/20 0407 08/24/20 0416 08/26/20 0530 08/28/20 0520  WBC  --  12.5* 9.3 7.9 8.6  HGB 7.5* 7.1* 7.1* 7.5* 7.4*  HCT 22.0* 22.7* 23.2* 25.0* 23.5*  MCV  --  82.8 84.4 86.2 84.2  PLT  --  421* 346 372 542   Basic Metabolic Panel: Recent Labs  Lab 08/22/20 1854 08/23/20 0407 08/24/20 0416 08/26/20 0530 08/28/20 0520  NA 143 147* 149* 148* 137  K 4.0 4.0 4.0 4.7 4.6  CL  --  115* 117* 114* 103  CO2  --  21* 24 26 28   GLUCOSE  --  102* 89 159* 169*  BUN  --  38* 34* 32* 26*  CREATININE  --  0.42* 0.45* 0.31* <0.30*  CALCIUM  --  8.4* 8.4* 8.3* 8.2*   GFR: CrCl cannot be calculated (This lab value cannot be used to calculate CrCl because it is not a number: <0.30). Liver Function Tests: No results for input(s): AST, ALT, ALKPHOS, BILITOT, PROT, ALBUMIN in the last 168 hours. No results for input(s): LIPASE, AMYLASE in the last 168 hours. No results for input(s): AMMONIA in the last 168 hours. Coagulation Profile: No results for input(s): INR, PROTIME in the last 168 hours. Cardiac Enzymes: No results for input(s): CKTOTAL, CKMB, CKMBINDEX, TROPONINI in the last 168 hours. BNP (last 3 results) No  results for input(s): PROBNP in the last 8760 hours. HbA1C: No results for input(s): HGBA1C in the last 72 hours. CBG: Recent Labs  Lab 08/28/20 1200 08/28/20 1542 08/28/20 1938 08/28/20 2341 08/29/20 0339  GLUCAP 126* 138* 159* 134* 152*   Lipid Profile: No results for input(s): CHOL, HDL, LDLCALC, TRIG, CHOLHDL, LDLDIRECT in the last 72 hours. Thyroid Function Tests: No results for input(s): TSH, T4TOTAL, FREET4, T3FREE, THYROIDAB in the last 72 hours. Anemia Panel: No results for input(s): VITAMINB12, FOLATE, FERRITIN, TIBC, IRON, RETICCTPCT in the last 72 hours. Sepsis Labs: No results for input(s): PROCALCITON, LATICACIDVEN in the last 168 hours.  Recent Results (from the past 240 hour(s))  Culture, blood (Routine X 2) w Reflex to  ID Panel     Status: None   Collection Time: 08/20/20  7:47 PM   Specimen: BLOOD LEFT HAND  Result Value Ref Range Status   Specimen Description BLOOD LEFT HAND  Final   Special Requests   Final    BOTTLES DRAWN AEROBIC AND ANAEROBIC Blood Culture adequate volume   Culture   Final    NO GROWTH 5 DAYS Performed at [facility], [address], [place], [place] [address]    Report Status 08/25/2020 FINAL  Final  Culture, blood (Routine X 2) w Reflex to ID Panel     Status: None   Collection Time: 08/21/20  2:42 AM   Specimen: BLOOD LEFT HAND  Result Value Ref Range Status   Specimen Description BLOOD LEFT HAND  Final   Special Requests   Final    BOTTLES DRAWN AEROBIC ONLY Blood Culture adequate volume   Culture   Final    NO GROWTH 5 DAYS Performed at [facility], [place] [address], [place], [place] [address]    Report Status 08/26/2020 FINAL  Final  Culture, Urine     Status: None   Collection Time: 08/21/20 11:35 AM   Specimen: Urine, Catheterized  Result Value Ref Range Status   Specimen Description URINE, CATHETERIZED  Final   Special Requests Normal  Final   Culture   Final    NO GROWTH Performed at [facility] [address], [place], [place] [address]    Report Status 08/22/2020 FINAL  Final     Radiology Studies: No results found.  Scheduled Meds: . aspirin  81 mg Per Tube Daily  . chlorhexidine  15 mL Mouth Rinse BID  . Chlorhexidine Gluconate Cloth  6 each Topical Daily  . collagenase   Topical BID  . enoxaparin (LOVENOX) injection  40 mg Subcutaneous Q24H  . feeding supplement (PROSource TF)  45 mL Per Tube TID  . free water  100 mL Per Tube Q8H  . glycopyrrolate  0.1 mg Intravenous TID  . insulin aspart  0-9 Units Subcutaneous Q4H  . methylPREDNISolone (SOLU-MEDROL) injection  40 mg Intravenous Q12H  . midodrine  5 mg Per Tube BID WC  . pantoprazole sodium  40 mg Per Tube QHS  . sodium chloride flush  10-40 mL Intracatheter Q12H  . sulfamethoxazole-trimethoprim  1 tablet Per Tube Once per day on Mon Wed Fri   Continuous Infusions: . dextrose 5 % and 0.9% NaCl 50 mL/hr at 08/28/20 0555  . feeding supplement (OSMOLITE 1.5 CAL) 1,000 mL (08/28/20 2124)  . levETIRAcetam 1,500 mg (08/29/20 0753)     LOS: 21 days   Time spent: 58min  Kerrie Timm C Jayse [place], DO [facility]  If 7PM-7AM, please contact night-coverage [email or web address]  08/29/2020, 8:03 AM

## 2020-08-29 NOTE — Progress Notes (Signed)
Speech Language Pathology Treatment: Dysphagia  Patient Details Name: Cameron Hale [name]: [id number] DOB: [birth year] Today's Date: 08/29/2020 Time: 1440-1535 SLP Time Calculation (min) (ACUTE ONLY): 55 min  Assessment / Plan / Recommendation Clinical Impression  Patient seen with Mom Cameron Hale) present for swallow exercises, PO trials and education. Mom voiced her concerns that patient is not getting enough direct therapy for his swallowing, that he is not being offered ice chips at the 3x a day frequency that was recommended and she does want him to get a PEG tube before adequate amount of therapy is attempted to work on swallow function. It seems as though a miscommunication occurred sometime along the way. In addition, Mom described previous rehab during which patient was able to transition to Dys 3 solids, thin liquids and she is confident he will be able to return to this. She is apprehensive about PEG tube because of risks of infection, etc. SLP discussed with Mom that PEG tube was being recommended to give Cameron Hale adequate nutrition but that swallow treatments would continue. In addition, SLP explained to Mom that on acute care side of therapy, we cannot guarantee therapy every day. She was understanding of that and asked about things she or nursing could do with him. SLP demonstrated and provided Mom with a print out of some oral motor, voice, speech and swallow exercises she could work on with Cameron Hale. SLP consulted with fellow SLP in department and so plan is for a repeat swallow (likely another FEES) and for Mom to be present (Dad was present last time). SLP feels that being present and having real-time explanation and education about the results will help with determining future therapy, nutritional decisions.  Patient exhibited prolonged mastication and oral transit with ice chips and bites of gelatin but no overt cough/throat clearing. He had a fairly open mouth posture when chewing and did not  exhibit any rotary chewing. He performed lingual ROM, continuous phonation at phoneme and short phrase level.     HPI HPI: [name] is a 44 y.o. male with history of neurofibromatosis type II with extensive cervical neuromas-underwent surgery March 2021 and unfortunately complicated by postoperative infection and quadriplegia, seizures on keppra, blind OS w/ impaired eye movements, presenting to [facility] 9/22 with altered mental status. Developed hypoglycemia, hypotension and increased WOB. Presents with acute hypoxic respiratory failure due to acute encephalopathy, acute strokes, chronic weakness from quadriplegia and neurofibromatosis, and acute medication induced pneumonitis. Pt intubated 9/25-10/4.  FEES 10/5-  severe, diffuse weakness across oral and pharyngeal musculature leading to delayed oral transit, spillage to pyriforms prior to the swallow and then severe residue after the swallow. Pt has bilateral errythema and indentations on posterior portion of the vocal folds. There is gradual silent aspiration of residue via the posterior commissure.       SLP Plan  Continue with current plan of care       Recommendations  Diet recommendations: NPO;Other(comment) (4-5 ice chips three times a day after oral care if alert) Medication Administration: Via alternative means                Oral Care Recommendations: Oral care QID Follow up Recommendations: Home health SLP;24 hour supervision/assistance SLP Visit Diagnosis: Dysphagia, oropharyngeal phase (R13.12) Plan: Continue with current plan of care       [place], MA, [facility]

## 2020-08-29 NOTE — Progress Notes (Signed)
Nutrition Follow-up  DOCUMENTATION CODES:   Not applicable  INTERVENTION:  Continue Osmolite 1.5 formula via Cortrak NGT at goal rate of 55 ml/hr.   Continue 45 ml Prosource TF TID per tube.   Free water flushes of 100 ml q 8 hours. (MD to adjust as appropriate)  Tube feeding to provide 2100 kcal, 116 grams of protein,and 1303 ml of free water.   NUTRITION DIAGNOSIS:   Increased nutrient needs related to wound healing as evidenced by estimated needs; ongoing  GOAL:   Patient will meet greater than or equal to 90% of their needs; met with TF  MONITOR:   Diet advancement, Labs, Weight trends, TF tolerance, Skin, I & O's  REASON FOR ASSESSMENT:   Ventilator, Consult Enteral/tube feeding initiation and management, Assessment of nutrition requirement/status  ASSESSMENT:   44 year old male who presented to the ED on 9/21 with AMS. 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 Pt admitted with sepsis, left upper lobe aspiration pneumonia  09/25 - intubated, chest tube insertion 09/30 - chest tube removed 10/04 - extubated 10/06 - Cortrak NGT placed, tip of tube in stomach  Pt continues on NPO status. SLP continue to follow for dysphagia with possible attempts of diet advancement. Pt has been tolerating his tube feedings well via Cortrak NGT. RD to continue with current tube feeding orders. Family refusing PEG tube. Family hopeful pt to overcome swallowing difficulties.   Labs and medications reviewed.   Diet Order:   Diet Order            Diet NPO time specified  Diet effective now                 EDUCATION NEEDS:   No education needs have been identified at this time  Skin:  Skin Assessment: Skin Integrity Issues: Skin Integrity Issues:: Unstageable Unstageable: sacrum  Last BM:  10/12 Rectal tube 350 ml output yesterday  Height:   Ht Readings from Last 1  Encounters:  08/12/20 '5\' 10"'  (1.778 m)    Weight:   Wt Readings from Last 1 Encounters:  05/30/20 65.6 kg    Ideal Body Weight:  68 kg (adjusted for quadriplegia)  BMI:  Body mass index is 22.9 kg/m.  Estimated Nutritional Needs:   Kcal:  2000-2200  Protein:  110-130 grams  Fluid:  >/= 1.8 L  Corrin Parker, MS, RD, LDN RD pager number/after hours weekend pager number on Amion.

## 2020-08-30 DIAGNOSIS — D62 Acute posthemorrhagic anemia: Secondary | ICD-10-CM

## 2020-08-30 DIAGNOSIS — N319 Neuromuscular dysfunction of bladder, unspecified: Secondary | ICD-10-CM

## 2020-08-30 DIAGNOSIS — L89152 Pressure ulcer of sacral region, stage 2: Secondary | ICD-10-CM

## 2020-08-30 DIAGNOSIS — G9340 Encephalopathy, unspecified: Secondary | ICD-10-CM

## 2020-08-30 DIAGNOSIS — S14104S Unspecified injury at C4 level of cervical spinal cord, sequela: Secondary | ICD-10-CM

## 2020-08-30 DIAGNOSIS — R569 Unspecified convulsions: Secondary | ICD-10-CM | POA: Diagnosis not present

## 2020-08-30 DIAGNOSIS — M5001 Cervical disc disorder with myelopathy,  high cervical region: Secondary | ICD-10-CM

## 2020-08-30 DIAGNOSIS — E871 Hypo-osmolality and hyponatremia: Secondary | ICD-10-CM

## 2020-08-30 DIAGNOSIS — I959 Hypotension, unspecified: Secondary | ICD-10-CM

## 2020-08-30 DIAGNOSIS — Q8502 Neurofibromatosis, type 2: Secondary | ICD-10-CM | POA: Diagnosis not present

## 2020-08-30 DIAGNOSIS — H548 Legal blindness, as defined in USA: Secondary | ICD-10-CM

## 2020-08-30 DIAGNOSIS — K592 Neurogenic bowel, not elsewhere classified: Secondary | ICD-10-CM

## 2020-08-30 DIAGNOSIS — J9601 Acute respiratory failure with hypoxia: Secondary | ICD-10-CM | POA: Diagnosis not present

## 2020-08-30 DIAGNOSIS — E876 Hypokalemia: Secondary | ICD-10-CM

## 2020-08-30 DIAGNOSIS — G40909 Epilepsy, unspecified, not intractable, without status epilepticus: Secondary | ICD-10-CM

## 2020-08-30 DIAGNOSIS — R1310 Dysphagia, unspecified: Secondary | ICD-10-CM

## 2020-08-30 DIAGNOSIS — R471 Dysarthria and anarthria: Secondary | ICD-10-CM

## 2020-08-30 DIAGNOSIS — I6330 Cerebral infarction due to thrombosis of unspecified cerebral artery: Secondary | ICD-10-CM

## 2020-08-30 DIAGNOSIS — Z4800 Encounter for change or removal of nonsurgical wound dressing: Secondary | ICD-10-CM | POA: Diagnosis not present

## 2020-08-30 LAB — CBC
HCT: 25.6 % — ABNORMAL LOW (ref 39.0–52.0)
Hemoglobin: 7.7 g/dL — ABNORMAL LOW (ref 13.0–17.0)
MCH: 26.1 pg (ref 26.0–34.0)
MCHC: 30.1 g/dL (ref 30.0–36.0)
MCV: 86.8 fL (ref 80.0–100.0)
Platelets: 281 10*3/uL (ref 150–400)
RBC: 2.95 MIL/uL — ABNORMAL LOW (ref 4.22–5.81)
RDW: 19.5 % — ABNORMAL HIGH (ref 11.5–15.5)
WBC: 10.4 10*3/uL (ref 4.0–10.5)
nRBC: 0.2 % (ref 0.0–0.2)

## 2020-08-30 LAB — GLUCOSE, CAPILLARY
Glucose-Capillary: 167 mg/dL — ABNORMAL HIGH (ref 70–99)
Glucose-Capillary: 134 mg/dL — ABNORMAL HIGH (ref 70–99)
Glucose-Capillary: 146 mg/dL — ABNORMAL HIGH (ref 70–99)
Glucose-Capillary: 154 mg/dL — ABNORMAL HIGH (ref 70–99)
Glucose-Capillary: 135 mg/dL — ABNORMAL HIGH (ref 70–99)

## 2020-08-30 NOTE — Plan of Care (Signed)
  Problem: Clinical Measurements: Goal: Will remain free from infection Outcome: Progressing   Problem: Pain Managment: Goal: General experience of comfort will improve Outcome: Progressing   Problem: Safety: Goal: Ability to remain free from injury will improve Outcome: Progressing   Problem: Nutrition: Goal: Risk of aspiration will decrease Outcome: Progressing

## 2020-08-30 NOTE — Progress Notes (Signed)
PROGRESS NOTE    Cameron Hale  CNO:[id number] DOB: June 12, 1976 DOA: 08/08/2020 PCP: Cameron Hale   Brief Narrative: Cameron Hale is a 44 y.o. malewith medical history significant oftype 2 neurofibromatosis and seizures.Patient was noted to have a generalized seizure prior to admission, consistent in blank staringand beingunresponsive.Through the course of the day he returned to his baseline.  The patient subsequently became much more somnolent, lethargic, and unresponsive.  Patient subsequently transported to [facility] via EMS. On route he had persistent hypotension and he was brought to [facility] emergency department.  Family indicates patient had neurosurgery intervention in March 2021, post surgery he developed functional quadriplegia, blindness and hearing loss,the onlyfullneurologic function preserved is talking. He was discharged August 2021 home with his parents. He needs assistance with this basic activities of daily living including bathing and eating. He does have a decubitus pressure ulcer stage II. He was diagnosed with urinary tract infection about 7 days ago and he had ciprofloxacin prescribed.  In the ED patient noted to be unresponsive, his CT head with advanced meningiomatosis, unchanged vasogenic edema. No acute bleeding. Arterial blood gas analysis with normal pH. Chest radiograph with signs of aspiration pneumonia. [facility] was contacted, unfortunately unable to perform ED to ED transfer. Patient received intravenous fluids, dexamethasone, 2 g of Keppra,2 mg IV lorazepam, aztreonam,vancomycin, cefepime and metronidazole. Referred for admission forfurtherevaluation  9/25 worsening acute hypoxia, patient subsequently intubated,right chest tube x2 9/30 chest tubes were removed 10/4 extubated  10/5 weaned off oxygen, currently on RA 10/6 coretrak placed 10/7 weaned off levophed; mild hypothermia overnight - improving with bairhugger 10/8  stable, continue speech evaluation family requesting reevaluation and consideration for advancement of diet as they are hesitant to place PEG tube  Assessment & Plan:   Principal Problem:   Seizures ([place]) Active Problems:   Paraparesis ([place])   Mixed conductive and sensorineural hearing loss of right ear with unrestricted hearing of left ear   Quadriplegia ([place])   AMS (altered mental status)   Aspiration pneumonia ([place])   Neurofibromatosis 2 ([place])   Acute respiratory failure with hypoxia ([place])   Cerebral thrombosis with cerebral infarction   DNR (do not resuscitate) discussion   History of ETT   Palliative care by specialist   Acute respiratory failure with hypoxia Patient required mechanical ventilation in ICU and was extubated on 10/4. Currently weaned to room air. Resolved.  Pneumonitis Drug induced, secondary to previous monoclonal antibody neurofibromatosis treatment as an outpatient. Currently on steroids with plan for a slow taper over 6 weeks. Patient is also on P JP prophylaxis with Bactrim until prednisone dose is less than 20 mg daily. -Continue Solu-Medrol and Bactrim prophylaxis  Acute on chronic dysphagia Patient currently has Cortrak in place from 10/6 with tube feeds. Speech therapy on board with recommendations for continued alternate mode of nutrition. PEG tube has been recommended but is declined at this time. Discussed with patient personally on 10/13 and he states he does not want a PEG tube and understands the limitations of NG tube in addition to consequences of continued poor nutritional intake leading to malnutrition, functional decline and death. -Continue Cortrak NG tube and tube feeds at 55 ml/hr -Palliative care discussion  Acute metabolic encephalopathy Back to baseline. EEG obtained without evidence of seizures.  Acute ischemic infarcts Bilateral ACA strokes thought secondary to mass-effect of patient's meningiomatosis.  Right pleural  effusion Thought to be transudate of from poor oncotic pressure. Currently resolved.  Hypernatremia Resolved  Hypokalemia Hypomagnesemia Resolved  Pre-diabetes Patient has had hypo- and hyperglycemic episodes. Hemoglobin A1C of 6.1%.  Labile blood pressure Patient started on midodrine. There is possible autonomic instability related to heavy neurofibromatosis burden on spinal cord -Continue midodrine  Neurofibromatosis, type 2 Progressive. Patient currently follows at [facility]. Patient is currently on brigatinib therapy which is currently on hold.  Seizure disorder -Continue Keppra  Chronic anemia Iron panel not very definitive. Mostly normocytic on history. -Pathologist blood smear review   Severe protein calorie malnutrition Poor albumin. Currently on tube feeds as mentioned above. Dietitian is consulted. Speech therapy is consulted.  Pressure injury Sacrum, POA.   DVT prophylaxis: Lovenox Code Status:   Code Status: Full Code Family Communication: None at bedside. Patient declined for me to call family today. Disposition Plan: Transfer to med-surg level of care. Ultimately, plan for discharge home vs SNF pending ability to tolerate PO intake vs continued goals of care.   Consultants:   PCCM  Palliative care medicine  Procedures:     Antimicrobials:  Vanc 9/21 >> 9/24  Aztreonam 9/21  Flagyl 9/21 >> 9/25  Cefepime 9/21 >> 9/25  Ceftriaxone 9/26 >> 9/29  Bactrim 10/1 >> ongoing   Subjective: Patient reports no issues overnight. No pain.   Objective: Vitals:   08/29/20 2344 08/30/20 0346 08/30/20 0800 08/30/20 0805  BP: 120/86 (!) 135/92 (!) 138/104 (!) 131/93  Pulse: 74 73 68 65  Resp: 12 14 13 13   Temp: 98.3 F (36.8 C) 98.5 F (36.9 C)    TempSrc: Oral Oral    SpO2: 97% 97% 100% 100%  Weight:      Height:        Intake/Output Summary (Last 24 hours) at 08/30/2020 0845 Last data filed at 08/30/2020  0655 Gross per 24 hour  Intake 1418.17 ml  Output 2000 ml  Net -581.83 ml   Filed Weights   08/21/20 0500  Weight: 72.4 kg    Examination:  General exam: Appears calm and comfortable Respiratory system: Clear to auscultation. Respiratory effort normal. Cardiovascular system: S1 & S2 heard, RRR. No murmurs, rubs, gallops or clicks. Gastrointestinal system: Abdomen is slightly distended, soft and nontender. No organomegaly or masses felt. Decreased bowel sounds heard. Central nervous system: Alert and oriented. Musculoskeletal: No edema. No calf tenderness Skin: No cyanosis. No rashes Psychiatry: Judgement and insight appear normal. Flat affect and depressed mood.    Data Reviewed: I have personally reviewed following labs and imaging studies  CBC Lab Results  Component Value Date   WBC 10.4 08/30/2020   RBC 2.95 (L) 08/30/2020   HGB 7.7 (L) 08/30/2020   HCT 25.6 (L) 08/30/2020   MCV 86.8 08/30/2020   MCH 26.1 08/30/2020   PLT 281 08/30/2020   MCHC 30.1 08/30/2020   RDW 19.5 (H) 08/30/2020   LYMPHSABS 0.8 08/08/2020   MONOABS 0.4 08/08/2020   EOSABS 0.0 08/08/2020   BASOSABS 0.0 05/17/1600     Last metabolic panel Lab Results  Component Value Date   NA 137 08/28/2020   K 4.6 08/28/2020   CL 103 08/28/2020   CO2 28 08/28/2020   BUN 26 (H) 08/28/2020   CREATININE <0.30 (L) 08/28/2020   GLUCOSE 169 (H) 08/28/2020   GFRNONAA NOT CALCULATED 08/28/2020   GFRAA NOT CALCULATED 08/22/2020   CALCIUM 8.2 (L) 08/28/2020   PHOS 2.2 (L) 08/17/2020   PROT 4.7 (L) 08/22/2020   ALBUMIN 2.0 (L) 08/22/2020   BILITOT 0.4 08/22/2020   ALKPHOS  69 08/22/2020   AST 27 08/22/2020   ALT 91 (H) 08/22/2020   ANIONGAP 6 08/28/2020    CBG (last 3)  Recent Labs    08/29/20 2342 08/30/20 0354 08/30/20 0806  GLUCAP 131* 167* 134*     GFR: CrCl cannot be calculated (This lab value cannot be used to calculate CrCl because it is not a number: <0.30).  Coagulation  Profile: No results for input(s): INR, PROTIME in the last 168 hours.  Recent Results (from the past 240 hour(s))  Culture, blood (Routine X 2) w Reflex to ID Panel     Status: None   Collection Time: 08/20/20  7:47 PM   Specimen: BLOOD LEFT HAND  Result Value Ref Range Status   Specimen Description BLOOD LEFT HAND  Final   Special Requests   Final    BOTTLES DRAWN AEROBIC AND ANAEROBIC Blood Culture adequate volume   Culture   Final    NO GROWTH 5 DAYS Performed at [facility], [address], [place], [place] [address]    Report Status 08/25/2020 FINAL  Final  Culture, blood (Routine X 2) w Reflex to ID Panel     Status: None   Collection Time: 08/21/20  2:42 AM   Specimen: BLOOD LEFT HAND  Result Value Ref Range Status   Specimen Description BLOOD LEFT HAND  Final   Special Requests   Final    BOTTLES DRAWN AEROBIC ONLY Blood Culture adequate volume   Culture   Final    NO GROWTH 5 DAYS Performed at [facility], [place] [address], [place], [place] [address]    Report Status 08/26/2020 FINAL  Final  Culture, Urine     Status: None   Collection Time: 08/21/20 11:35 AM   Specimen: Urine, Catheterized  Result Value Ref Range Status   Specimen Description URINE, CATHETERIZED  Final   Special Requests Normal  Final   Culture   Final    NO GROWTH Performed at [place] [place] [address] [place], [place] [address]    Report Status 08/22/2020 FINAL  Final        Radiology Studies: No results found.      Scheduled Meds: . aspirin  81 mg Per Tube Daily  . chlorhexidine  15 mL Mouth Rinse BID  . Chlorhexidine Gluconate Cloth  6 each Topical Daily  . collagenase   Topical BID  . enoxaparin (LOVENOX) injection  40 mg Subcutaneous Q24H  . feeding supplement (PROSource TF)  45 mL Per Tube TID  . free water  100 mL Per Tube Q8H  . glycopyrrolate  0.1 mg Intravenous TID  . insulin aspart  0-9 Units Subcutaneous Q4H  . methylPREDNISolone (SOLU-MEDROL)  injection  40 mg Intravenous Q12H  . midodrine  5 mg Per Tube BID WC  . pantoprazole sodium  40 mg Per Tube QHS  . sodium chloride flush  10-40 mL Intracatheter Q12H  . sulfamethoxazole-trimethoprim  1 tablet Per Tube Once per day on Mon Wed Fri   Continuous Infusions: . dextrose 5 % and 0.9% NaCl 50 mL/hr at 08/30/20 0655  . feeding supplement (OSMOLITE 1.5 CAL) 1,000 mL (08/29/20 1704)  . levETIRAcetam 1,500 mg (08/29/20 2033)     LOS: 22 days     Cordelia Poche, [facility] 08/30/2020, 8:45 AM  If 7PM-7AM, please contact night-coverage [email or web address]

## 2020-08-31 ENCOUNTER — Inpatient Hospital Stay (HOSPITAL_COMMUNITY): Payer: Medicare Other

## 2020-08-31 DIAGNOSIS — J9601 Acute respiratory failure with hypoxia: Secondary | ICD-10-CM | POA: Diagnosis not present

## 2020-08-31 DIAGNOSIS — I6330 Cerebral infarction due to thrombosis of unspecified cerebral artery: Secondary | ICD-10-CM | POA: Diagnosis not present

## 2020-08-31 DIAGNOSIS — R569 Unspecified convulsions: Secondary | ICD-10-CM | POA: Diagnosis not present

## 2020-08-31 DIAGNOSIS — Q8502 Neurofibromatosis, type 2: Secondary | ICD-10-CM | POA: Diagnosis not present

## 2020-08-31 LAB — GLUCOSE, CAPILLARY
Glucose-Capillary: 117 mg/dL — ABNORMAL HIGH (ref 70–99)
Glucose-Capillary: 123 mg/dL — ABNORMAL HIGH (ref 70–99)
Glucose-Capillary: 125 mg/dL — ABNORMAL HIGH (ref 70–99)
Glucose-Capillary: 127 mg/dL — ABNORMAL HIGH (ref 70–99)
Glucose-Capillary: 131 mg/dL — ABNORMAL HIGH (ref 70–99)
Glucose-Capillary: 131 mg/dL — ABNORMAL HIGH (ref 70–99)
Glucose-Capillary: 137 mg/dL — ABNORMAL HIGH (ref 70–99)

## 2020-08-31 NOTE — Progress Notes (Signed)
[facility] ([facility]) Community Based Palliative Care   °    °This patient is enrolled in our palliative care services in the community.  [facility] will continue to follow for any discharge planning needs and to coordinate continuation of palliative care.   °If you have questions or need assistance, please call [phone] or contact the hospital Liaison listed on AMION.    ° °Thank you for the opportunity to participate in this patient’s care. °    °Chrislyn King, BSN, RN °[facility] Hospital Liaison   °[phone] (24h on call) ° °

## 2020-08-31 NOTE — Progress Notes (Signed)
Modified Barium Swallow Progress Note  Patient Details  Name: Cameron Hale [name]: [id number] Date of Birth: [birth year]  Today's Date: 08/31/2020  Modified Barium Swallow completed.  Full report located under Chart Review in the Imaging Section.  Brief recommendations include the following:  Clinical Impression   Pt continues to demonstrate a severe oropharyngeal dysphagia characterized by widespread weakness. His oral phase is prolonged with reduced bolus cohesion, but leaving minimal amounts of residue behind. Pharyngeally he has limited movement diffusely, not able to clear the majority of boluses from his pharynx and not able to sufficiently close off his airway. Pt has consistent penetration during the swallow and after because of the residue throughout his pharynx, which is also consistently aspirated. At times the amount of aspiration is more trace, but even when it becomes larger in volume, pt cannot produce a cough (volitionally or reflexively) that can clear his airway. Cues were provided to attempt hard swallows and supraglottic swallows (pt already in a chin down position), but without significant improvement in function. Overall function appears likely to be consistent with initial FEES from 11/5. These results were reviewed with pt and his mother immediately after testing, also sharing that we may have a lot of dysphagia therapy ahead of him to work on swallowing function. Would continue to provide nutrition via alternate means, although perhaps considering a longer-term option, as SLP continues to follow for dysphagia treatment. Would also still provide 4-5 ice chips at a time after oral care, at least 3x a day (reviewed with RN as well).    Swallow Evaluation Recommendations     SLP Diet Recommendations: NPO     Medication Administration: Via alternative means   Oral Care Recommendations: Oral care QID        [name], M.A. [place] [facility] Pager  [phone] Office [phone]  08/31/2020,11:42 AM

## 2020-08-31 NOTE — Progress Notes (Signed)
  Speech Language Pathology Treatment: Dysphagia  Patient Details Name: Cameron Hale [name]: [id number] DOB: August 13, 1976 Today's Date: 08/31/2020 Time: 8280-0349 SLP Time Calculation (min) (ACUTE ONLY): 29 min  Assessment / Plan / Recommendation Clinical Impression  Pt was seen for dysphagia tx with mother present. Education was reinforced regarding FEES results, likely contributors to acute on chronic dysphagia, and current POC. Ice chips were provided with cues to swallow hard and repeatedly swallow. Dry swallows seemed to be harder for him to initiate compared to when there was a bolus to stimulate a swallow response. Intermittent throat clearing was noted across trials. Pt also performed CTAR x10 with Min-Mod cues. Pt and mother believe that his voice is nearing his baseline (they say he has always been soft spoken) and has improved since extubation. Given the above, also considering the time since his last instrumental test, recommend proceeding with repeat testing. MBS can be completed this morning and hopefully will also give [place] some insight into potential progress with swallowing as pt and family are trying to make decisions about nutrition.     HPI HPI: Mr. MALEKO GREULICH is a 44 y.o. male with history of neurofibromatosis type II with extensive cervical neuromas-underwent surgery March 2021 and unfortunately complicated by postoperative infection and quadriplegia, seizures on keppra, blind OS w/ impaired eye movements, presenting to [facility] 9/22 with altered mental status. Developed hypoglycemia, hypotension and increased WOB. Presents with acute hypoxic respiratory failure due to acute encephalopathy, acute strokes, chronic weakness from quadriplegia and neurofibromatosis, and acute medication induced pneumonitis. Pt intubated 9/25-10/4.  FEES 10/5-  severe, diffuse weakness across oral and pharyngeal musculature leading to delayed oral transit, spillage to pyriforms prior to the  swallow and then severe residue after the swallow. Pt has bilateral errythema and indentations on posterior portion of the vocal folds. There is gradual silent aspiration of residue via the posterior commissure.       SLP Plan  MBS       Recommendations  Diet recommendations: NPO;Other(comment) (4-5 ice chips 3x/day after oral care) Medication Administration: Via alternative means                Oral Care Recommendations: Oral care QID Follow up Recommendations: Home health SLP;24 hour supervision/assistance SLP Visit Diagnosis: Dysphagia, oropharyngeal phase (R13.12) Plan: MBS       GO                [name], M.A. [place] [facility] Pager [phone] Office [phone]  08/31/2020, 9:44 AM

## 2020-08-31 NOTE — Evaluation (Signed)
Occupational Therapy Evaluation Patient Details Name: Cameron Hale [name]: [id number] DOB: 1976/10/02 Today's Date: 08/31/2020    History of Present Illness 44 yo admitted 9/21 with unresponsiveness and seizures. Intubated 9/25-10/4. MRI with bilcerebral frontal infarcts. PMhx: neurofibromatosis with neurosurgical intervention march 2021 with resultant quadriplegia, seizure disorder,left eye blindness, hearing loss.  He was on CIR 6/23-7/16/2021   Clinical Impression   Pt admitted with above. He demonstrates the below listed deficits and will benefit from continued OT to maximize safety and independence with BADLs.  Pt was lethargic throughout OT eval.  His chart, including notes from CIR reviewed.  Pt demonstrates increased tightness Rt elbow, and bil. Shoulders.  He requires total A for all ADLs and functional mobility, and this is close to his baseline per chart review.  At time of discharge from CIR he was able to drive power w/c with Rt UE with at least min A.   OT will follow acutely for focus on family education to ensure family knows how to provide PROM and AROM exercise to maintain max functional use of UEs.   Recommend use of maxi move lift to transfer pt OOB, however, recommend family bring in power w/c to maintain optimal positioning and pressure relief.        Follow Up Recommendations  Home health OT;Supervision/Assistance - 24 hour    Equipment Recommendations  None recommended by OT    Recommendations for Other Services       Precautions / Restrictions Precautions Precautions: Other (comment) Precaution Comments: quadriplegia      Mobility Bed Mobility Overal bed mobility: Needs Assistance Bed Mobility: Rolling Rolling: Total assist            Transfers                 General transfer comment: did not attempt.  Pt has been dependent on hoyer lift     Balance                                           ADL either performed or  assessed with clinical judgement   ADL                                         General ADL Comments: Pt requires total A for all aspects      Vision Baseline Vision/History: Legally blind (Lt eye )       Perception     Praxis      Pertinent Vitals/Pain Pain Assessment: Faces Faces Pain Scale: No hurt     Hand Dominance     Extremity/Trunk Assessment Upper Extremity Assessment Upper Extremity Assessment: RUE deficits/detail RUE Deficits / Details: Shoulder PROM limited ~85*.  Pt maintains Rt UE in flexed position with tightness noted at elbow, however, with prolonged stretch, able to achieve full PROM.  PROM of wrist WFL.  Hand arches flattended with mild flexor contractures of IP joints.  RUE Coordination: decreased fine motor;decreased gross motor LUE Deficits / Details: shoulder limited ~90* flexion; remainder of PROM WFL.  Pt was able to assist minimally with elbow flex/ext.  Hand with flat arches  LUE Coordination: decreased fine motor;decreased gross motor       Cervical / Trunk Assessment Cervical / Trunk Exceptions: head/neck rotated to the Rt  Communication Communication Communication: HOH   Cognition Arousal/Alertness: Lethargic Behavior During Therapy: Flat affect Overall Cognitive Status: No family/caregiver present to determine baseline cognitive functioning                                 General Comments: Pt lethargic and difficult to maintain arousal    General Comments       Exercises Exercises: Other exercises Other Exercises Other Exercises: PROM performed x 5 reps bil. shoulders, elbows, wrists, and hands    Shoulder Instructions      Home Living Family/patient expects to be discharged to:: Private residence Living Arrangements: Parent;Other relatives Available Help at Discharge: Family;Friend(s);Available 24 hours/day Type of Home: House Home Access: Ramped entrance     Home Layout: One level      Bathroom Shower/Tub: Teacher, early years/pre: Standard Bathroom Accessibility: Yes How Accessible: Accessible via walker Home Equipment: Hospital bed;Other (comment);Wheelchair - power (hoyer lift )   Additional Comments: information gathered from previous chart entry      Prior Functioning/Environment Level of Independence: Needs assistance  Gait / Transfers Assistance Needed: max-total assist for bed mobility, hoyer lift to power WC after CIR stay ADL's / Homemaking Assistance Needed: Per CIR notes, pt was max A - dependent with ADLs at time of discharge in 05/2020            OT Problem List: Decreased strength;Decreased range of motion;Decreased activity tolerance;Impaired balance (sitting and/or standing);Impaired vision/perception;Decreased coordination;Decreased cognition;Decreased safety awareness;Impaired tone;Impaired UE functional use      OT Treatment/Interventions: Neuromuscular education;Therapeutic exercise;Therapeutic activities;Patient/family education;Splinting    OT Goals(Current goals can be found in the care plan section) Acute Rehab OT Goals OT Goal Formulation: Patient unable to participate in goal setting ADL Goals Additional ADL Goal #1: Family will be independent with PROM bil. UEs Additional ADL Goal #2: Pt and family will be independent with HEP to strengthen bil. UEs/AROM Additional ADL Goal #3: Pt will tolerate OOB x 1 hour in prep for w/c use  OT Frequency: Min 2X/week   Barriers to D/C:            Co-evaluation              AM-PAC OT "6 Clicks" Daily Activity     Outcome Measure Help from another person eating meals?: Total Help from another person taking care of personal grooming?: Total Help from another person toileting, which includes using toliet, bedpan, or urinal?: Total Help from another person bathing (including washing, rinsing, drying)?: Total Help from another person to put on and taking off regular upper body  clothing?: Total Help from another person to put on and taking off regular lower body clothing?: Total 6 Click Score: 6   End of Session    Activity Tolerance: Patient limited by lethargy Patient left: in bed;with call bell/phone within reach  OT Visit Diagnosis: Muscle weakness (generalized) (M62.81);Cognitive communication deficit (R41.841)                Time: 4270-6237 OT Time Calculation (min): 8 min Charges:  OT General Charges $OT Visit: 1 Visit OT Evaluation $OT Eval Moderate Complexity: 1 Mod  Nilsa Nutting., OTR/L Acute Rehabilitation Services Pager [phone] Office [place], [place] 08/31/2020, 12:24 PM

## 2020-08-31 NOTE — Progress Notes (Addendum)
PT Cancellation Note  Patient Details Name: Cameron Hale [name]: [id number] DOB: 11/06/1976   Cancelled Treatment:    Reason Eval/Treat Not Completed: Patient declined, no reason specified. PT attempted to see pt for evaluation. Upon arrival pt does not arouse to verbal stimuli, PT attempts sternal rub with minimal arousal. Pt begins to shake head no to PT evaluation and shakes his head yes when asked if he is tired. Pt then does not respond to any more of PT questions. PT will attempt to follow up when the pt is more agreeable to participate in PT evaluation.   Zenaida Niece 08/31/2020, 3:51 PM

## 2020-08-31 NOTE — Progress Notes (Signed)
PROGRESS NOTE    Cameron Hale  LGX:[id number] DOB: 1976/09/29 DOA: 08/08/2020 PCP: Alroy Dust, L.Marlou Sa, MD   Brief Narrative: Cameron Hale is a 44 y.o. malewith medical history significant oftype 2 neurofibromatosis and seizures.Patient was noted to have a generalized seizure prior to admission, consistent in blank staringand beingunresponsive.Through the course of the day he returned to his baseline.  The patient subsequently became much more somnolent, lethargic, and unresponsive.  Patient subsequently transported to [facility] via EMS. On route he had persistent hypotension and he was brought to [facility] emergency department.  Family indicates patient had neurosurgery intervention in March 2021, post surgery he developed functional quadriplegia, blindness and hearing loss,the onlyfullneurologic function preserved is talking. He was discharged August 2021 home with his parents. He needs assistance with this basic activities of daily living including bathing and eating. He does have a decubitus pressure ulcer stage II. He was diagnosed with urinary tract infection about 7 days ago and he had ciprofloxacin prescribed.  In the ED patient noted to be unresponsive, his CT head with advanced meningiomatosis, unchanged vasogenic edema. No acute bleeding. Arterial blood gas analysis with normal pH. Chest radiograph with signs of aspiration pneumonia. [facility] was contacted, unfortunately unable to perform ED to ED transfer. Patient received intravenous fluids, dexamethasone, 2 g of Keppra,2 mg IV lorazepam, aztreonam,vancomycin, cefepime and metronidazole. Referred for admission forfurtherevaluation  9/25 worsening acute hypoxia, patient subsequently intubated,right chest tube x2 9/30 chest tubes were removed 10/4 extubated  10/5 weaned off oxygen, currently on RA 10/6 coretrak placed 10/7 weaned off levophed; mild hypothermia overnight - improving with bairhugger 10/8  stable, continue speech evaluation family requesting reevaluation and consideration for advancement of diet as they are hesitant to place PEG tube  Assessment & Plan:   Principal Problem:   Seizures ([place]) Active Problems:   Paraparesis ([place])   Mixed conductive and sensorineural hearing loss of right ear with unrestricted hearing of left ear   Quadriplegia ([place])   AMS (altered mental status)   Aspiration pneumonia ([place])   Neurofibromatosis 2 ([place])   Acute respiratory failure with hypoxia ([place])   Cerebral thrombosis with cerebral infarction   DNR (do not resuscitate) discussion   History of ETT   Palliative care by specialist   Acute respiratory failure with hypoxia Patient required mechanical ventilation in ICU and was extubated on 10/4. Currently weaned to room air. Resolved.  Pneumonitis Drug induced, secondary to previous monoclonal antibody neurofibromatosis treatment as an outpatient. Currently on steroids with plan for a slow taper over 6 weeks. Patient is also on P JP prophylaxis with Bactrim until prednisone dose is less than 20 mg daily. -Continue Solu-Medrol and Bactrim prophylaxis  Acute on chronic dysphagia Patient currently has Cortrak in place from 10/6 with tube feeds. Speech therapy on board with recommendations for continued alternate mode of nutrition. PEG tube is recommended and patient/mother have now agreed to placement.  -Continue Cortrak NG tube and tube feeds at 55 ml/hr -Will await speech therapy evaluation today and recommend PEG tube placement based on SLP recommendations.  Acute metabolic encephalopathy Back to baseline. EEG obtained without evidence of seizures.  Acute ischemic infarcts Bilateral ACA strokes thought secondary to mass-effect of patient's meningiomatosis.  Right pleural effusion Thought to be transudate of from poor oncotic pressure. Currently  resolved.  Hypernatremia Resolved  Hypokalemia Hypomagnesemia Resolved  Pre-diabetes Patient has had hypo- and hyperglycemic episodes. Hemoglobin A1C of 6.1%.  Labile blood pressure Patient started on midodrine. There  is possible autonomic instability related to heavy neurofibromatosis burden on spinal cord -Continue midodrine  Neurofibromatosis, type 2 Progressive. Patient currently follows at [facility]. Patient is currently on brigatinib therapy which is currently on hold.  Seizure disorder -Continue Keppra  Chronic anemia Iron panel not very definitive. Mostly normocytic on history. -Pathologist blood smear review   Severe protein calorie malnutrition Poor albumin. Currently on tube feeds as mentioned above. Dietitian is consulted. Speech therapy is consulted.  Pressure injury Sacrum, POA.   DVT prophylaxis: Lovenox Code Status:   Code Status: Full Code Family Communication: Mother at bedside Disposition Plan: Plan for discharge home pending decision on nutrition management for outpatient. Anticipate 3-5 days. Home with home health PT, OT, SLP, RN, home aide when ready.   Consultants:   PCCM  Palliative care medicine  Procedures:     Antimicrobials:  Vanc 9/21 >> 9/24  Aztreonam 9/21  Flagyl 9/21 >> 9/25  Cefepime 9/21 >> 9/25  Ceftriaxone 9/26 >> 9/29  Bactrim 10/1 >> ongoing   Subjective: No concerns today.  Objective: Vitals:   08/30/20 2100 08/31/20 0030 08/31/20 0337 08/31/20 0804  BP: (!) 92/54 109/75 99/69 107/74  Pulse: 87 90 91 97  Resp: 16 16 16 18   Temp: 98.1 F (36.7 C) 98.7 F (37.1 C) 98.3 F (36.8 C) 99.6 F (37.6 C)  TempSrc: Axillary Oral Oral Oral  SpO2: 98% 98% 96% 96%  Height:        Intake/Output Summary (Last 24 hours) at 08/31/2020 0929 Last data filed at 08/30/2020 1701 Gross per 24 hour  Intake 605 ml  Output 600 ml  Net 5 ml   Filed Weights    Examination:  General  exam: Appears calm and comfortable Respiratory system: Clear to auscultation. Respiratory effort normal. Cardiovascular system: S1 & S2 heard, RRR. No murmurs, rubs, gallops or clicks. Gastrointestinal system: Abdomen is nondistended, soft and nontender. No organomegaly or masses felt. Normal bowel sounds heard. Central nervous system: Alert and oriented. No focal neurological deficits. Musculoskeletal: No edema. No calf tenderness. Skin: No cyanosis. No rashes Psychiatry: Judgement and insight appear normal. Mood & affect appropriate.     Data Reviewed: I have personally reviewed following labs and imaging studies  CBC Lab Results  Component Value Date   WBC 10.4 08/30/2020   RBC 2.95 (L) 08/30/2020   HGB 7.7 (L) 08/30/2020   HCT 25.6 (L) 08/30/2020   MCV 86.8 08/30/2020   MCH 26.1 08/30/2020   PLT 281 08/30/2020   MCHC 30.1 08/30/2020   RDW 19.5 (H) 08/30/2020   LYMPHSABS 0.8 08/08/2020   MONOABS 0.4 08/08/2020   EOSABS 0.0 08/08/2020   BASOSABS 0.0 09/73/5329     Last metabolic panel Lab Results  Component Value Date   NA 137 08/28/2020   K 4.6 08/28/2020   CL 103 08/28/2020   CO2 28 08/28/2020   BUN 26 (H) 08/28/2020   CREATININE <0.30 (L) 08/28/2020   GLUCOSE 169 (H) 08/28/2020   GFRNONAA NOT CALCULATED 08/28/2020   GFRAA NOT CALCULATED 08/22/2020   CALCIUM 8.2 (L) 08/28/2020   PHOS 2.2 (L) 08/17/2020   PROT 4.7 (L) 08/22/2020   ALBUMIN 2.0 (L) 08/22/2020   BILITOT 0.4 08/22/2020   ALKPHOS 69 08/22/2020   AST 27 08/22/2020   ALT 91 (H) 08/22/2020   ANIONGAP 6 08/28/2020    CBG (last 3)  Recent Labs    08/31/20 0024 08/31/20 0347 08/31/20 0801  GLUCAP 125* 137* 117*  GFR: CrCl cannot be calculated (This lab value cannot be used to calculate CrCl because it is not a number: <0.30).  Coagulation Profile: No results for input(s): INR, PROTIME in the last 168 hours.  Recent Results (from the past 240 hour(s))  Culture, Urine     Status: None    Collection Time: 08/21/20 11:35 AM   Specimen: Urine, Catheterized  Result Value Ref Range Status   Specimen Description URINE, CATHETERIZED  Final   Special Requests Normal  Final   Culture   Final    NO GROWTH Performed at [facility], [address] [place], [place] [address]    Report Status 08/22/2020 FINAL  Final        Radiology Studies: No results found.      Scheduled Meds: . aspirin  81 mg Per Tube Daily  . chlorhexidine  15 mL Mouth Rinse BID  . Chlorhexidine Gluconate Cloth  6 each Topical Daily  . collagenase   Topical BID  . enoxaparin (LOVENOX) injection  40 mg Subcutaneous Q24H  . feeding supplement (PROSource TF)  45 mL Per Tube TID  . free water  100 mL Per Tube Q8H  . glycopyrrolate  0.1 mg Intravenous TID  . insulin aspart  0-9 Units Subcutaneous Q4H  . methylPREDNISolone (SOLU-MEDROL) injection  40 mg Intravenous Q12H  . midodrine  5 mg Per Tube BID WC  . pantoprazole sodium  40 mg Per Tube QHS  . sodium chloride flush  10-40 mL Intracatheter Q12H  . sulfamethoxazole-trimethoprim  1 tablet Per Tube Once per day on Mon Wed Fri   Continuous Infusions: . dextrose 5 % and 0.9% NaCl 50 mL/hr at 08/31/20 0305  . feeding supplement (OSMOLITE 1.5 CAL) 1,000 mL (08/31/20 0516)  . levETIRAcetam 1,500 mg (08/31/20 0845)     LOS: 23 days     Cameron Poche, MD [facility] 08/31/2020, 9:29 AM  If 7PM-7AM, please contact night-coverage [email or web address]

## 2020-09-01 ENCOUNTER — Inpatient Hospital Stay (HOSPITAL_COMMUNITY): Payer: Medicare Other

## 2020-09-01 DIAGNOSIS — Q8502 Neurofibromatosis, type 2: Secondary | ICD-10-CM | POA: Diagnosis not present

## 2020-09-01 DIAGNOSIS — I6330 Cerebral infarction due to thrombosis of unspecified cerebral artery: Secondary | ICD-10-CM | POA: Diagnosis not present

## 2020-09-01 DIAGNOSIS — R569 Unspecified convulsions: Secondary | ICD-10-CM | POA: Diagnosis not present

## 2020-09-01 DIAGNOSIS — J9601 Acute respiratory failure with hypoxia: Secondary | ICD-10-CM | POA: Diagnosis not present

## 2020-09-01 LAB — GLUCOSE, CAPILLARY
Glucose-Capillary: 150 mg/dL — ABNORMAL HIGH (ref 70–99)
Glucose-Capillary: 162 mg/dL — ABNORMAL HIGH (ref 70–99)
Glucose-Capillary: 91 mg/dL (ref 70–99)
Glucose-Capillary: 99 mg/dL (ref 70–99)
Glucose-Capillary: 136 mg/dL — ABNORMAL HIGH (ref 70–99)
Glucose-Capillary: 115 mg/dL — ABNORMAL HIGH (ref 70–99)

## 2020-09-01 LAB — HEMOGLOBIN AND HEMATOCRIT, BLOOD
HCT: 22 % — ABNORMAL LOW (ref 39.0–52.0)
HCT: 25.6 % — ABNORMAL LOW (ref 39.0–52.0)
Hemoglobin: 6.6 g/dL — CL (ref 13.0–17.0)
Hemoglobin: 8 g/dL — ABNORMAL LOW (ref 13.0–17.0)

## 2020-09-01 LAB — CBC
HCT: 21.8 % — ABNORMAL LOW (ref 39.0–52.0)
Hemoglobin: 6.5 g/dL — CL (ref 13.0–17.0)
MCH: 25.7 pg — ABNORMAL LOW (ref 26.0–34.0)
MCHC: 29.8 g/dL — ABNORMAL LOW (ref 30.0–36.0)
MCV: 86.2 fL (ref 80.0–100.0)
Platelets: 215 10*3/uL (ref 150–400)
RBC: 2.53 MIL/uL — ABNORMAL LOW (ref 4.22–5.81)
RDW: 19.9 % — ABNORMAL HIGH (ref 11.5–15.5)
WBC: 10.9 10*3/uL — ABNORMAL HIGH (ref 4.0–10.5)
nRBC: 0.3 % — ABNORMAL HIGH (ref 0.0–0.2)

## 2020-09-01 LAB — ABO/RH: ABO/RH(D): O POS

## 2020-09-01 LAB — LACTIC ACID, PLASMA
Lactic Acid, Venous: 1.7 mmol/L (ref 0.5–1.9)
Lactic Acid, Venous: 1.7 mmol/L (ref 0.5–1.9)

## 2020-09-01 LAB — PREPARE RBC (CROSSMATCH)

## 2020-09-01 LAB — PATHOLOGIST SMEAR REVIEW

## 2020-09-01 MED ORDER — SODIUM CHLORIDE 0.9% IV SOLUTION: Freq: Once | INTRAVENOUS | Status: AC

## 2020-09-01 NOTE — Progress Notes (Signed)
   09/01/20 1846  Vitals  Temp 98.6 F (37 C)  Temp Source Oral  BP (!) 99/56  MAP (mmHg) 69  BP Location Left Arm  BP Method Automatic  Patient Position (if appropriate) Lying  Pulse Rate 96  Pulse Rate Source Dinamap  Resp 18  MEWS COLOR  MEWS Score Color Green  Oxygen Therapy  SpO2 97 %  O2 Device Room Air  MEWS Score  MEWS Temp 0  MEWS Systolic 1  MEWS Pulse 0  MEWS RR 0  MEWS LOC 0  MEWS Score 1   Post transfusion VS. MD paged with pts BP. Midodrine given late.

## 2020-09-01 NOTE — Progress Notes (Addendum)
Paged MD with concerns of rectal temp of 94.3.  Axillary temp checked with individual/personal thermometer reading 93.2. Pt cool to touch but states he does not feel cold. Order for [facility] and labs placed. RN confirmed with MD of beginning blood transfusion.

## 2020-09-01 NOTE — Progress Notes (Signed)
PROGRESS NOTE    Cameron Hale  ZLD:[id number] DOB: Dec 20, 1975 DOA: 08/08/2020 PCP: Alroy Dust, L.Marlou Sa, MD   Brief Narrative: Cameron Hale is a 44 y.o. malewith medical history significant oftype 2 neurofibromatosis and seizures.Patient was noted to have a generalized seizure prior to admission, consistent in blank staringand beingunresponsive.Through the course of the day he returned to his baseline.  The patient subsequently became much more somnolent, lethargic, and unresponsive.  Patient subsequently transported to [facility] via EMS. On route he had persistent hypotension and he was brought to [facility] emergency department.  Family indicates patient had neurosurgery intervention in March 2021, post surgery he developed functional quadriplegia, blindness and hearing loss,the onlyfullneurologic function preserved is talking. He was discharged August 2021 home with his parents. He needs assistance with this basic activities of daily living including bathing and eating. He does have a decubitus pressure ulcer stage II. He was diagnosed with urinary tract infection about 7 days ago and he had ciprofloxacin prescribed.  In the ED patient noted to be unresponsive, his CT head with advanced meningiomatosis, unchanged vasogenic edema. No acute bleeding. Arterial blood gas analysis with normal pH. Chest radiograph with signs of aspiration pneumonia. [facility] was contacted, unfortunately unable to perform ED to ED transfer. Patient received intravenous fluids, dexamethasone, 2 g of Keppra,2 mg IV lorazepam, aztreonam,vancomycin, cefepime and metronidazole. Referred for admission forfurtherevaluation  9/25 worsening acute hypoxia, patient subsequently intubated,right chest tube x2 9/30 chest tubes were removed 10/4 extubated  10/5 weaned off oxygen, currently on RA 10/6 coretrak placed 10/7 weaned off levophed; mild hypothermia overnight - improving with bairhugger 10/8  stable, continue speech evaluation family requesting reevaluation and consideration for advancement of diet as they are hesitant to place PEG tube  Assessment & Plan:   Principal Problem:   Seizures ([place]) Active Problems:   Paraparesis ([place])   Mixed conductive and sensorineural hearing loss of right ear with unrestricted hearing of left ear   Quadriplegia ([place])   AMS (altered mental status)   Aspiration pneumonia ([place])   Neurofibromatosis 2 ([place])   Acute respiratory failure with hypoxia ([place])   Cerebral thrombosis with cerebral infarction   DNR (do not resuscitate) discussion   History of ETT   Palliative care by specialist   Acute respiratory failure with hypoxia Patient required mechanical ventilation in ICU and was extubated on 10/4. Currently weaned to room air. Resolved.  Pneumonitis Drug induced, secondary to previous monoclonal antibody neurofibromatosis treatment as an outpatient. Currently on steroids with plan for a slow taper over 6 weeks. Patient is also on P JP prophylaxis with Bactrim until prednisone dose is less than 20 mg daily. -Continue Solu-Medrol and Bactrim prophylaxis  Acute on chronic dysphagia Patient currently has Cortrak in place from 10/6 with tube feeds. Speech therapy on board with recommendations for continued alternate mode of nutrition. PEG tube is recommended and patient/mother have now agreed to placement. SLP reevaluated 9/14 and recommending to continue NPO with ice chips 4-5 timers per day. -Hold tube feeds pending PEG tube placement  Acute metabolic encephalopathy Back to baseline. EEG obtained without evidence of seizures.  Acute ischemic infarcts Bilateral ACA strokes thought secondary to mass-effect of patient's meningiomatosis.  Right pleural effusion Thought to be transudate of from poor oncotic pressure. Currently resolved.  Hypernatremia Resolved  Hypokalemia Hypomagnesemia Resolved  Pre-diabetes Patient has had hypo- and  hyperglycemic episodes. Hemoglobin A1C of 6.1%.  Labile blood pressure Patient started on midodrine. There is possible autonomic instability  related to heavy neurofibromatosis burden on spinal cord -Continue midodrine  Neurofibromatosis, type 2 Progressive. Patient currently follows at [facility]. Patient is currently on brigatinib therapy which is currently on hold.  Seizure disorder -Continue Keppra  Acute on Chronic anemia Iron panel not very definitive. Mostly normocytic on history. Baseline of 8-9. Has been stable around 7-8 this admission with acute drop to 6.5 this morning. Unsure of etiology -1 unit of PRBC -Pathologist blood smear review ordered and is pending -FOBT  Severe protein calorie malnutrition Poor albumin. Currently on tube feeds as mentioned above. Dietitian is consulted. Speech therapy is consulted.  Left arm swelling Likely related to poor albumin. No tenderness or erythema. Unlikely related to acute DVT but needs to be ruled out -Elevate arm -Upper extremity venous duplex  Pressure injury Sacrum, POA.   DVT prophylaxis: Lovenox Code Status:   Code Status: Full Code Family Communication: Mother at bedside Disposition Plan: Plan for discharge home pending insertion of PEG tube and resumption of tube feeds with stability. Anticipate 3-5 days. Home with home health PT, OT, SLP, RN, home aide when ready.   Consultants:   PCCM  Palliative care medicine  Procedures:     Antimicrobials:  Vanc 9/21 >> 9/24  Aztreonam 9/21  Flagyl 9/21 >> 9/25  Cefepime 9/21 >> 9/25  Ceftriaxone 9/26 >> 9/29  Bactrim 10/1 >> ongoing   Subjective: No issues. Mother concerned about his left arm swelling and low hemoglobin.  Objective: Vitals:   08/31/20 1938 09/01/20 0500 09/01/20 0748 09/01/20 1148  BP: 120/78 (!) 141/91 (!) 136/97 (!) 134/99  Pulse: 82 84 74 71  Resp: 18 16 18 18   Temp: 98.5 F (36.9 C) (!) 97.5 F (36.4 C)  97.7 F (36.5 C)   TempSrc: Oral Oral Oral   SpO2: 94% 98% 97% 100%  Height:        Intake/Output Summary (Last 24 hours) at 09/01/2020 1217 Last data filed at 09/01/2020 3267 Gross per 24 hour  Intake 3176.25 ml  Output 700 ml  Net 2476.25 ml   Filed Weights    Examination:  General exam: Appears calm and comfortable Respiratory system: Clear to auscultation. Respiratory effort normal. Cardiovascular system: S1 & S2 heard, RRR. No murmurs, rubs, gallops or clicks. Gastrointestinal system: Abdomen is nondistended, soft and nontender. No organomegaly or masses felt. Normal bowel sounds heard. Central nervous system: Alert and oriented. No focal neurological deficits. Musculoskeletal: Left hand and forearm edema without tenderness or erythema. No calf tenderness Skin: No cyanosis. No rashes Psychiatry: Judgement and insight appear normal. Mood & affect appropriate.   Data Reviewed: I have personally reviewed following labs and imaging studies  CBC Lab Results  Component Value Date   WBC 10.9 (H) 09/01/2020   RBC 2.53 (L) 09/01/2020   HGB 6.6 (LL) 09/01/2020   HCT 22.0 (L) 09/01/2020   MCV 86.2 09/01/2020   MCH 25.7 (L) 09/01/2020   PLT 215 09/01/2020   MCHC 29.8 (L) 09/01/2020   RDW 19.9 (H) 09/01/2020   LYMPHSABS 0.8 08/08/2020   MONOABS 0.4 08/08/2020   EOSABS 0.0 08/08/2020   BASOSABS 0.0 12/45/8099     Last metabolic panel Lab Results  Component Value Date   NA 137 08/28/2020   K 4.6 08/28/2020   CL 103 08/28/2020   CO2 28 08/28/2020   BUN 26 (H) 08/28/2020   CREATININE <0.30 (L) 08/28/2020   GLUCOSE 169 (H) 08/28/2020   GFRNONAA NOT CALCULATED 08/28/2020   GFRAA  NOT CALCULATED 08/22/2020   CALCIUM 8.2 (L) 08/28/2020   PHOS 2.2 (L) 08/17/2020   PROT 4.7 (L) 08/22/2020   ALBUMIN 2.0 (L) 08/22/2020   BILITOT 0.4 08/22/2020   ALKPHOS 69 08/22/2020   AST 27 08/22/2020   ALT 91 (H) 08/22/2020   ANIONGAP 6 08/28/2020    CBG (last 3)  Recent Labs     09/01/20 0422 09/01/20 0745 09/01/20 1139  GLUCAP 150* 162* 91     GFR: CrCl cannot be calculated (This lab value cannot be used to calculate CrCl because it is not a number: <0.30).  Coagulation Profile: No results for input(s): INR, PROTIME in the last 168 hours.  No results found for this or any previous visit (from the past 240 hour(s)).      Radiology Studies: DG Swallowing Func-Speech Pathology  Result Date: 08/31/2020 Objective Swallowing Evaluation: Type of Study: MBS-Modified Barium Swallow Study  Patient Details Name: ALEJANDRO GAMEL [name]: [id number] Date of Birth: 01-28-1976 Today's Date: 08/31/2020 Time: SLP Start Time (ACUTE ONLY): 4098 -SLP Stop Time (ACUTE ONLY): 1032 SLP Time Calculation (min) (ACUTE ONLY): 17 min Past Medical History: Past Medical History: Diagnosis Date . Hard of hearing  . NF2 (neurofibromatosis 2) ([place])  . Pressure ulcer  . Quadriplegia ([place])  . Seizures ([place])  . Vestibular schwannoma [facility])  Past Surgical History: Past Surgical History: Procedure Laterality Date . brain bleed    mri 2009 . HAND EXPLORATION   . LUMBAR FUSION   . NEPHRECTOMY    Partial left nephrectomy  . SPINAL FUSION   HPI: Mr. LACHARLES ALTSCHULER is a 44 y.o. male with history of neurofibromatosis type II with extensive cervical neuromas-underwent surgery March 2021 and unfortunately complicated by postoperative infection and quadriplegia, seizures on keppra, blind OS w/ impaired eye movements, presenting to [facility] 9/22 with altered mental status. Developed hypoglycemia, hypotension and increased WOB. Presents with acute hypoxic respiratory failure due to acute encephalopathy, acute strokes, chronic weakness from quadriplegia and neurofibromatosis, and acute medication induced pneumonitis. Pt intubated 9/25-10/4.  FEES 10/5-  severe, diffuse weakness across oral and pharyngeal musculature leading to delayed oral transit, spillage to pyriforms prior to the swallow and then severe  residue after the swallow. Pt has bilateral errythema and indentations on posterior portion of the vocal folds. There is gradual silent aspiration of residue via the posterior commissure.  Subjective: pt alert, cooperative Assessment / Plan / Recommendation CHL IP CLINICAL IMPRESSIONS 08/31/2020 Clinical Impression Pt continues to demonstrate a severe oropharyngeal dysphagia characterized by widespread weakness. His oral phase is prolonged with reduced bolus cohesion, but leaving minimal amounts of residue behind. Pharyngeally he has limited movement diffusely, not able to clear the majority of boluses from his pharynx and not able to sufficiently close off his airway. Pt has consistent penetration during the swallow and after because of the residue throughout his pharynx, which is also consistently aspirated. At times the amount of aspiration is more trace, but even when it becomes larger in volume, pt cannot produce a cough (volitionally or reflexively) that can clear his airway. Cues were provided to attempt hard swallows and supraglottic swallows (pt already in a chin down position), but without significant improvement in function. Overall function appears likely to be consistent with initial FEES from 11/5. These results were reviewed with pt and his mother immediately after testing, also sharing that we may have a lot of dysphagia therapy ahead of him to work on swallowing function. Would continue to provide  nutrition via alternate means, although perhaps considering a longer-term option, as SLP continues to follow for dysphagia treatment. Would also still provide 4-5 ice chips at a time after oral care, at least 3x a day (reviewed with RN as well).  SLP Visit Diagnosis Dysphagia, oropharyngeal phase (R13.12) Attention and concentration deficit following -- Frontal lobe and executive function deficit following -- Impact on safety and function Severe aspiration risk   CHL IP TREATMENT RECOMMENDATION 08/31/2020  Treatment Recommendations Therapy as outlined in treatment plan below   Prognosis 08/31/2020 Prognosis for Safe Diet Advancement Fair Barriers to Reach Goals Severity of deficits Barriers/Prognosis Comment -- CHL IP DIET RECOMMENDATION 08/31/2020 SLP Diet Recommendations NPO Liquid Administration via -- Medication Administration Via alternative means Compensations -- Postural Changes --   CHL IP OTHER RECOMMENDATIONS 08/31/2020 Recommended Consults -- Oral Care Recommendations Oral care QID Other Recommendations --   CHL IP FOLLOW UP RECOMMENDATIONS 08/31/2020 Follow up Recommendations Home health SLP;24 hour supervision/assistance   CHL IP FREQUENCY AND DURATION 08/31/2020 Speech Therapy Frequency (ACUTE ONLY) min 2x/week Treatment Duration 2 weeks      CHL IP ORAL PHASE 08/31/2020 Oral Phase Impaired Oral - Pudding Teaspoon -- Oral - Pudding Cup -- Oral - Honey Teaspoon -- Oral - Honey Cup -- Oral - Nectar Teaspoon Decreased bolus cohesion;Delayed oral transit;Weak lingual manipulation Oral - Nectar Cup NT Oral - Nectar Straw -- Oral - Thin Teaspoon Decreased bolus cohesion;Delayed oral transit;Weak lingual manipulation Oral - Thin Cup NT Oral - Thin Straw -- Oral - Puree -- Oral - Mech Soft -- Oral - Regular -- Oral - Multi-Consistency -- Oral - Pill -- Oral Phase - Comment --  CHL IP PHARYNGEAL PHASE 08/31/2020 Pharyngeal Phase Impaired Pharyngeal- Pudding Teaspoon -- Pharyngeal -- Pharyngeal- Pudding Cup -- Pharyngeal -- Pharyngeal- Honey Teaspoon -- Pharyngeal -- Pharyngeal- Honey Cup -- Pharyngeal -- Pharyngeal- Nectar Teaspoon Delayed swallow initiation-vallecula;Reduced epiglottic inversion;Reduced pharyngeal peristalsis;Reduced anterior laryngeal mobility;Reduced laryngeal elevation;Reduced airway/laryngeal closure;Reduced tongue base retraction;Pharyngeal residue - valleculae;Pharyngeal residue - pyriform;Lateral channel residue;Inter-arytenoid space residue;Compensatory strategies attempted (with  notebox);Penetration/Apiration after swallow Pharyngeal Material enters airway, passes BELOW cords without attempt by patient to eject out (silent aspiration) Pharyngeal- Nectar Cup NT Pharyngeal -- Pharyngeal- Nectar Straw -- Pharyngeal -- Pharyngeal- Thin Teaspoon Delayed swallow initiation-vallecula;Reduced epiglottic inversion;Reduced pharyngeal peristalsis;Reduced anterior laryngeal mobility;Reduced laryngeal elevation;Reduced airway/laryngeal closure;Reduced tongue base retraction;Pharyngeal residue - valleculae;Pharyngeal residue - pyriform;Lateral channel residue;Inter-arytenoid space residue;Compensatory strategies attempted (with notebox);Penetration/Apiration after swallow;Penetration/Aspiration during swallow Pharyngeal Material enters airway, passes BELOW cords without attempt by patient to eject out (silent aspiration) Pharyngeal- Thin Cup NT Pharyngeal -- Pharyngeal- Thin Straw -- Pharyngeal -- Pharyngeal- Puree -- Pharyngeal -- Pharyngeal- Mechanical Soft -- Pharyngeal -- Pharyngeal- Regular -- Pharyngeal -- Pharyngeal- Multi-consistency -- Pharyngeal -- Pharyngeal- Pill -- Pharyngeal -- Pharyngeal Comment --  CHL IP CERVICAL ESOPHAGEAL PHASE 08/31/2020 Cervical Esophageal Phase WFL Pudding Teaspoon -- Pudding Cup -- Honey Teaspoon -- Honey Cup -- Nectar Teaspoon -- Nectar Cup -- Nectar Straw -- Thin Teaspoon -- Thin Cup -- Thin Straw -- Puree -- Mechanical Soft -- Regular -- Multi-consistency -- Pill -- Cervical Esophageal Comment -- [name], M.A. CCC-SLP Acute Rehabilitation Services Pager [phone] Office [phone] 08/31/2020, 11:50 AM                   Scheduled Meds: . sodium chloride   Intravenous Once  . aspirin  81 mg Per Tube Daily  . chlorhexidine  15 mL Mouth Rinse BID  . Chlorhexidine Gluconate Cloth  6 each Topical Daily  . collagenase   Topical BID  . feeding supplement (PROSource TF)  45 mL Per Tube TID  . free water  100 mL Per Tube Q8H  . glycopyrrolate  0.1  mg Intravenous TID  . insulin aspart  0-9 Units Subcutaneous Q4H  . methylPREDNISolone (SOLU-MEDROL) injection  40 mg Intravenous Q12H  . midodrine  5 mg Per Tube BID WC  . pantoprazole sodium  40 mg Per Tube QHS  . sodium chloride flush  10-40 mL Intracatheter Q12H  . sulfamethoxazole-trimethoprim  1 tablet Per Tube Once per day on Mon Wed Fri   Continuous Infusions: . dextrose 5 % and 0.9% NaCl 50 mL/hr at 08/31/20 2109  . feeding supplement (OSMOLITE 1.5 CAL) Stopped (09/01/20 0835)  . levETIRAcetam 1,500 mg (09/01/20 0835)     LOS: 24 days     Cordelia Poche, MD [facility] 09/01/2020, 12:17 PM  If 7PM-7AM, please contact night-coverage [email or web address]

## 2020-09-01 NOTE — Progress Notes (Signed)
PT Cancellation Note  Patient Details Name: Cameron Hale [name]: [id number] DOB: 09-Sep-1976   Cancelled Treatment:    Reason Eval/Treat Not Completed: Medical issues which prohibited therapy. Per discussion with nursing pt with hemoglobin at 6.5 currently, waiting to receive 1unit PRBC. PT will hold until blood transfusion is complete and the pt is more appropriate for PT evaluation.   Zenaida Niece 09/01/2020, 1:05 PM

## 2020-09-01 NOTE — Progress Notes (Signed)
  Speech Language Pathology Treatment: Dysphagia  Patient Details Name: Cameron Hale [name]: [id number] DOB: 12-May-1976 Today's Date: 09/01/2020 Time: 2122-4825 SLP Time Calculation (min) (ACUTE ONLY): 29 min  Assessment / Plan / Recommendation Clinical Impression  Pt was seen for swallowing tx. Ice chips were deferred at the moment as TFs are also on hold awaiting consult for PEG. Pt still engaged in swallowing exercises, performing CTAR x10 with min cues. He also tried EMST at 5 cm H20, needing physical assist from SLP to hold trainer and achieve adequate labial seal. SLP provided manual assist bilaterally around the mouthpiece so that pt could generate enough pressure. He performed two sets of 5 repetitions before stating that he felt like it was getting too challenging. Encouraged him to try it in smaller frequencies more often throughout the day in order to build up his endurance. Instructions for these exercises were written down so that pt can work on them outside of SLP visits as well. His mother was present throughout session and had a few questions about dysphagia/swallowing after PEG placement. I reassured her that dysphagia tx would continue, and that if he could safely return to PO intake, it would be okay to do this while PEG was still in place. Will continue to follow acutely, recommending [facility] SLP f/u post-discharge as well.    HPI HPI: [name] is a 44 y.o. male with history of neurofibromatosis type II with extensive cervical neuromas-underwent surgery March 2021 and unfortunately complicated by postoperative infection and quadriplegia, seizures on keppra, blind OS w/ impaired eye movements, presenting to [facility] 9/22 with altered mental status. Developed hypoglycemia, hypotension and increased WOB. Presents with acute hypoxic respiratory failure due to acute encephalopathy, acute strokes, chronic weakness from quadriplegia and neurofibromatosis, and acute medication  induced pneumonitis. Pt intubated 9/25-10/4.  FEES 10/5-  severe, diffuse weakness across oral and pharyngeal musculature leading to delayed oral transit, spillage to pyriforms prior to the swallow and then severe residue after the swallow. Pt has bilateral errythema and indentations on posterior portion of the vocal folds. There is gradual silent aspiration of residue via the posterior commissure.       SLP Plan  Continue with current plan of care       Recommendations  Diet recommendations: NPO;Other(comment) (4-5 ice chips 3x a day after oral care) Medication Administration: Via alternative means                Oral Care Recommendations: Oral care QID Follow up Recommendations: Home health SLP;24 hour supervision/assistance SLP Visit Diagnosis: Dysphagia, oropharyngeal phase (R13.12) Plan: Continue with current plan of care       GO                [name], M.A. [place] Acute Rehabilitation Services Pager [phone] Office [phone]  09/01/2020, 3:37 PM

## 2020-09-01 NOTE — Progress Notes (Signed)
   09/01/20 1541  Assess: MEWS Score  Temp (!) 94.3 F (34.6 C)  BP 130/80  Pulse Rate 67  Resp 16  SpO2 97 %  O2 Device Room Air  Assess: MEWS Score  MEWS Temp 2  MEWS Systolic 0  MEWS Pulse 0  MEWS RR 0  MEWS LOC 0  MEWS Score 2  MEWS Score Color Yellow  Assess: if the MEWS score is Yellow or Red  Were vital signs taken at a resting state? Yes  Focused Assessment No change from prior assessment  Early Detection of Sepsis Score *See Row Information* Low  MEWS guidelines implemented *See Row Information* Yes  Treat  MEWS Interventions Escalated (See documentation below)  Take Vital Signs  Increase Vital Sign Frequency  Yellow: Q 2hr X 2 then Q 4hr X 2, if remains yellow, continue Q 4hrs  Escalate  MEWS: Escalate Yellow: discuss with charge nurse/RN and consider discussing with provider and RRT  Notify: Charge Nurse/RN  Name of Charge Nurse/RN Optometrist, RN  Date Charge Nurse/RN Notified 09/01/20  Time Charge Nurse/RN Notified 1543  Notify: Provider  Provider Name/Title Dr. Lonny Prude  Date Provider Notified 09/01/20  Time Provider Notified 1541  Notification Type Call  Notification Reason Change in status (low temp)  Response See new orders  Date of Provider Response 09/01/20  Time of Provider Response 1541

## 2020-09-02 DIAGNOSIS — I6330 Cerebral infarction due to thrombosis of unspecified cerebral artery: Secondary | ICD-10-CM | POA: Diagnosis not present

## 2020-09-02 DIAGNOSIS — J9601 Acute respiratory failure with hypoxia: Secondary | ICD-10-CM | POA: Diagnosis not present

## 2020-09-02 DIAGNOSIS — Q8502 Neurofibromatosis, type 2: Secondary | ICD-10-CM | POA: Diagnosis not present

## 2020-09-02 DIAGNOSIS — R569 Unspecified convulsions: Secondary | ICD-10-CM | POA: Diagnosis not present

## 2020-09-02 LAB — GLUCOSE, CAPILLARY
Glucose-Capillary: 155 mg/dL — ABNORMAL HIGH (ref 70–99)
Glucose-Capillary: 132 mg/dL — ABNORMAL HIGH (ref 70–99)
Glucose-Capillary: 157 mg/dL — ABNORMAL HIGH (ref 70–99)
Glucose-Capillary: 135 mg/dL — ABNORMAL HIGH (ref 70–99)
Glucose-Capillary: 152 mg/dL — ABNORMAL HIGH (ref 70–99)

## 2020-09-02 LAB — CBC
HCT: 25.3 % — ABNORMAL LOW (ref 39.0–52.0)
Hemoglobin: 7.8 g/dL — ABNORMAL LOW (ref 13.0–17.0)
MCH: 26.4 pg (ref 26.0–34.0)
MCHC: 30.8 g/dL (ref 30.0–36.0)
MCV: 85.8 fL (ref 80.0–100.0)
Platelets: 191 10*3/uL (ref 150–400)
RBC: 2.95 MIL/uL — ABNORMAL LOW (ref 4.22–5.81)
RDW: 19.1 % — ABNORMAL HIGH (ref 11.5–15.5)
WBC: 9.6 10*3/uL (ref 4.0–10.5)
nRBC: 0.4 % — ABNORMAL HIGH (ref 0.0–0.2)

## 2020-09-02 LAB — BPAM RBC
Blood Product Expiration Date: 202111182359
ISSUE DATE / TIME: 202110151550
Unit Type and Rh: 5100

## 2020-09-02 LAB — BASIC METABOLIC PANEL
Anion gap: 6 (ref 5–15)
BUN: 31 mg/dL — ABNORMAL HIGH (ref 6–20)
CO2: 28 mmol/L (ref 22–32)
Calcium: 7.9 mg/dL — ABNORMAL LOW (ref 8.9–10.3)
Chloride: 107 mmol/L (ref 98–111)
Creatinine, Ser: 0.31 mg/dL — ABNORMAL LOW (ref 0.61–1.24)
GFR, Estimated: >60 mL/min (ref >60)
Glucose, Bld: 143 mg/dL — ABNORMAL HIGH (ref 70–99)
Potassium: 4.5 mmol/L (ref 3.5–5.1)
Sodium: 141 mmol/L (ref 135–145)

## 2020-09-02 LAB — TYPE AND SCREEN
ABO/RH(D): O POS
Antibody Screen: NEGATIVE
Unit division: 0

## 2020-09-02 NOTE — Progress Notes (Signed)
Oral temp 99.2, bair hugger turned off and removed at this time. Will continue to monitor temp and BP closely

## 2020-09-02 NOTE — Progress Notes (Signed)
Manufacturing engineer [facility]) Community Based Palliative Care  This patient is enrolled in our palliative care services in the community. [facility] will continue to follow for any discharge planning needs and to coordinate continuation of palliative care.  Thank you, Venia Carbon RN, BSN, [facility] Liaison

## 2020-09-02 NOTE — Progress Notes (Addendum)
PROGRESS NOTE    Cameron Hale  CZY:[id number] DOB: 09-Jun-1976 DOA: 08/08/2020 PCP: Alroy Dust, L.Marlou Sa, MD   Brief Narrative: Cameron Hale is a 44 y.o. malewith medical history significant oftype 2 neurofibromatosis and seizures.Patient was noted to have a generalized seizure prior to admission, consistent in blank staringand beingunresponsive.Through the course of the day he returned to his baseline.  The patient subsequently became much more somnolent, lethargic, and unresponsive.  Patient subsequently transported to [facility] via EMS. On route he had persistent hypotension and he was brought to [facility] emergency department.  Family indicates patient had neurosurgery intervention in March 2021, post surgery he developed functional quadriplegia, blindness and hearing loss,the onlyfullneurologic function preserved is talking. He was discharged August 2021 home with his parents. He needs assistance with this basic activities of daily living including bathing and eating. He does have a decubitus pressure ulcer stage II. He was diagnosed with urinary tract infection about 7 days ago and he had ciprofloxacin prescribed.  In the ED patient noted to be unresponsive, his CT head with advanced meningiomatosis, unchanged vasogenic edema. No acute bleeding. Arterial blood gas analysis with normal pH. Chest radiograph with signs of aspiration pneumonia. [facility] was contacted, unfortunately unable to perform ED to ED transfer. Patient received intravenous fluids, dexamethasone, 2 g of Keppra,2 mg IV lorazepam, aztreonam,vancomycin, cefepime and metronidazole. Referred for admission forfurtherevaluation  9/25 worsening acute hypoxia, patient subsequently intubated,right chest tube x2 9/30 chest tubes were removed 10/4 extubated  10/5 weaned off oxygen, currently on RA 10/6 coretrak placed 10/7 weaned off levophed; mild hypothermia overnight - improving with bairhugger 10/8  stable, continue speech evaluation family requesting reevaluation and consideration for advancement of diet as they are hesitant to place PEG tube  Assessment & Plan:   Principal Problem:   Seizures ([place]) Active Problems:   Paraparesis ([place])   Mixed conductive and sensorineural hearing loss of right ear with unrestricted hearing of left ear   Quadriplegia ([place])   AMS (altered mental status)   Aspiration pneumonia ([employer])   Neurofibromatosis 2 ([place])   Acute respiratory failure with hypoxia ([place])   Cerebral thrombosis with cerebral infarction   DNR (do not resuscitate) discussion   History of ETT   Palliative care by specialist   Acute respiratory failure with hypoxia Patient required mechanical ventilation in ICU and was extubated on 10/4. Currently weaned to room air. Resolved.  Pneumonitis Drug induced, secondary to previous monoclonal antibody neurofibromatosis treatment as an outpatient. Currently on steroids with plan for a slow taper over 6 weeks. Patient is also on P JP prophylaxis with Bactrim until prednisone dose is less than 20 mg daily. -Continue Solu-Medrol and Bactrim prophylaxis  Acute on chronic dysphagia Patient currently has Cortrak in place from 10/6 with tube feeds. Speech therapy on board with recommendations for continued alternate mode of nutrition. PEG tube is recommended and patient/mother have now agreed to placement. SLP reevaluated 9/14 and recommending to continue NPO with ice chips 4-5 timers per day. -Continue tube feeds -Follow-up IR recommendations fot PEG  Lethargy Patient is easily arousable. -Observe for now  Hypothermia Unsure if this was accurate but patient improved after a few hours on the [facility]. He has been normothermic since last night. Blood cultures obtained and are pending.  Acute metabolic encephalopathy Back to baseline. EEG obtained without evidence of seizures.  Acute ischemic infarcts Bilateral ACA strokes thought  secondary to mass-effect of patient's meningiomatosis.  Right pleural effusion Thought to be  transudate of from poor oncotic pressure. Currently resolved.  Hypernatremia Resolved  Hypokalemia Hypomagnesemia Resolved  Pre-diabetes Patient has had hypo- and hyperglycemic episodes. Hemoglobin A1C of 6.1%.  Labile blood pressure Patient started on midodrine. There is possible autonomic instability related to heavy neurofibromatosis burden on spinal cord -Continue midodrine  Neurofibromatosis, type 2 Progressive. Patient currently follows at [facility]. Patient is currently on brigatinib therapy which is currently on hold.  Seizure disorder -Continue Keppra  Acute on Chronic anemia Iron panel not very definitive. Mostly normocytic on history. Baseline of 8-9. Has been stable around 7-8 this admission with acute drop to 6.5 this morning. Unsure of etiology -1 unit of PRBC -Pathologist blood smear review ordered and is pending -FOBT  Severe protein calorie malnutrition Poor albumin. Currently on tube feeds as mentioned above. Dietitian is consulted. Speech therapy is consulted.  Left arm swelling Likely related to poor albumin. No tenderness or erythema. Unlikely related to acute DVT but needs to be ruled out. Looks improved today. -Elevate arm -Upper extremity venous duplex  Pressure injury Sacrum, POA.   DVT prophylaxis: Lovenox Code Status:   Code Status: Full Code Family Communication: Father at bedside Disposition Plan: Plan for discharge home pending insertion of PEG tube and resumption of tube feeds with stability. Anticipate 2-5 days. Home with home health PT, OT, SLP, RN, home aide when ready.   Consultants:   PCCM  Palliative care medicine  Procedures:     Antimicrobials:  Vanc 9/21 >> 9/24  Aztreonam 9/21  Flagyl 9/21 >> 9/25  Cefepime 9/21 >> 9/25  Ceftriaxone 9/26 >> 9/29  Bactrim 10/1 >>  ongoing   Subjective: Patient states he is tired. Father thinks he is more sleepy than usual. States the patient had hallucination episode earlier thinking his wife was in the room  Objective: Vitals:   09/02/20 0032 09/02/20 0332 09/02/20 0608 09/02/20 0834  BP: 97/63 94/65 107/70 99/61  Pulse: 92 88  97  Resp:  16  18  Temp: 98.5 F (36.9 C) 99.2 F (37.3 C) 99.1 F (37.3 C) 99.9 F (37.7 C)  TempSrc: Oral Oral Oral   SpO2:  94%  94%  Height:        Intake/Output Summary (Last 24 hours) at 09/02/2020 1210 Last data filed at 09/02/2020 4081 Gross per 24 hour  Intake 1962.5 ml  Output 1050 ml  Net 912.5 ml   Filed Weights    Examination:  General exam: Appears calm and comfortable Respiratory system: Clear to auscultation. Respiratory effort normal. Cardiovascular system: S1 & S2 heard, RRR. No murmurs, rubs, gallops or clicks. Gastrointestinal system: Abdomen is nondistended, soft and nontender. No organomegaly or masses felt. Normal bowel sounds heard. Central nervous system: Somnolent but easily arouses and is oriented. No focal neurological deficits. Musculoskeletal: No edema. No calf tenderness Skin: No cyanosis. No rashes Psychiatry: Judgement and insight appear normal. Mood & affect appropriate.   Data Reviewed: I have personally reviewed following labs and imaging studies  CBC Lab Results  Component Value Date   WBC 9.6 09/02/2020   RBC 2.95 (L) 09/02/2020   HGB 7.8 (L) 09/02/2020   HCT 25.3 (L) 09/02/2020   MCV 85.8 09/02/2020   MCH 26.4 09/02/2020   PLT 191 09/02/2020   MCHC 30.8 09/02/2020   RDW 19.1 (H) 09/02/2020   LYMPHSABS 0.8 08/08/2020   MONOABS 0.4 08/08/2020   EOSABS 0.0 08/08/2020   BASOSABS 0.0 44/81/8563     Last metabolic panel Lab  Results  Component Value Date   NA 141 09/02/2020   K 4.5 09/02/2020   CL 107 09/02/2020   CO2 28 09/02/2020   BUN 31 (H) 09/02/2020   CREATININE 0.31 (L) 09/02/2020   GLUCOSE 143 (H)  09/02/2020   GFRNONAA >60 09/02/2020   GFRAA NOT CALCULATED 08/22/2020   CALCIUM 7.9 (L) 09/02/2020   PHOS 2.2 (L) 08/17/2020   PROT 4.7 (L) 08/22/2020   ALBUMIN 2.0 (L) 08/22/2020   BILITOT 0.4 08/22/2020   ALKPHOS 69 08/22/2020   AST 27 08/22/2020   ALT 91 (H) 08/22/2020   ANIONGAP 6 09/02/2020    CBG (last 3)  Recent Labs    09/01/20 2321 09/02/20 0335 09/02/20 0833  GLUCAP 115* 155* 132*     GFR: Estimated Creatinine Clearance: 119.4 mL/min (A) (by C-G formula based on SCr of 0.31 mg/dL (L)).  Coagulation Profile: No results for input(s): INR, PROTIME in the last 168 hours.  Recent Results (from the past 240 hour(s))  Culture, blood (routine x 2)     Status: None (Preliminary result)   Collection Time: 09/01/20  4:08 PM   Specimen: BLOOD LEFT HAND  Result Value Ref Range Status   Specimen Description BLOOD LEFT HAND  Final   Special Requests   Final    BOTTLES DRAWN AEROBIC AND ANAEROBIC Blood Culture adequate volume   Culture   Final    NO GROWTH < 12 HOURS Performed at [facility], [address] [place], [place] [address]    Report Status PENDING  Incomplete  Culture, blood (routine x 2)     Status: None (Preliminary result)   Collection Time: 09/01/20  4:16 PM   Specimen: BLOOD  Result Value Ref Range Status   Specimen Description BLOOD LEFT ANTECUBITAL  Final   Special Requests   Final    BOTTLES DRAWN AEROBIC AND ANAEROBIC Blood Culture adequate volume   Culture   Final    NO GROWTH < 12 HOURS Performed at [facility], [place] [address] [place], [place] [address]    Report Status PENDING  Incomplete        Radiology Studies: CT ABDOMEN WO CONTRAST  Result Date: 09/02/2020 CLINICAL DATA:  44 year old male with history of quadriplegia, type 2 neurofibromatosis, and seizures. Evaluate for possible gastrostomy tube placement. EXAM: CT ABDOMEN WITHOUT CONTRAST TECHNIQUE: Multidetector CT imaging of the abdomen was performed following  the standard protocol without IV contrast. COMPARISON:  None. FINDINGS: Lower chest: Small bilateral pleural effusions, right greater than left with associated bibasilar passive atelectasis. Streaky regions of hyperattenuation in the right lung base as could be seen with sequela of aspiration versus calcifications due to chronic atelectasis. Hepatobiliary: The left lobe of the liver is relatively atrophic. The liver is otherwise normal in size, contour, and attenuation. The gallbladder is present and unremarkable. No intra or extrahepatic biliary ductal dilation. Pancreas: Unremarkable. No pancreatic ductal dilatation or surrounding inflammatory changes. Spleen: Normal in size without focal abnormality. Adrenals/Urinary Tract: The adrenal glands are normal in size and morphology bilaterally. The kidneys are symmetric in size bilaterally. About the anterior and superior left capsule is irregular appearing focal calcification measuring approximately 6 mm in maximum diameter of indeterminate etiology, however favored represent sequela of prior trauma or instrumentation versus calcification of prior hemorrhagic cyst. The dome of the bladder is visualized partially. Stomach/Bowel: High-density material in the dependent portion of the stomach, favored represent enteric contrast administered the prior day during swallowing study. Weighted  tip enteric tube is in place with the tip terminating in the first portion of the duodenum. The visualized portion of the small bowel is normal in course and caliber. There is residual enteric contrast material within the visualize ascending transverse and descending colon which is normal in appearance. No significant bowel wall thickening or surrounding inflammatory changes. The transverse colon is in close proximity to the gastric body and antrum, however would likely separate with insufflation of the stomach. Vascular/Lymphatic: No significant vascular findings are present. No enlarged  abdominal or pelvic lymph nodes. Other: No abdominal wall hernia or abnormality. Musculoskeletal: Status post posterior spinal instrumented fusion throughout the visualized spine sparing the L2 and S1 vertebral bodies. No evidence of hardware complication. No acute osseous abnormality. IMPRESSION: 1. Percutaneous gastrostomy tube placement is likely favorable with adequate insufflation of the stomach. Recommend enteric contrast administration 24-48 hours prior to placement for opacification of the overlying transverse colon. 2. No acute abnormality in the abdomen. 3. Small bilateral pleural effusions, right greater than left. Ruthann Cancer, MD [facility] Electronically Signed   By: Ruthann Cancer MD   On: 09/02/2020 09:18        Scheduled Meds: . aspirin  81 mg Per Tube Daily  . chlorhexidine  15 mL Mouth Rinse BID  . Chlorhexidine Gluconate Cloth  6 each Topical Daily  . collagenase   Topical BID  . feeding supplement (PROSource TF)  45 mL Per Tube TID  . free water  100 mL Per Tube Q8H  . glycopyrrolate  0.1 mg Intravenous TID  . insulin aspart  0-9 Units Subcutaneous Q4H  . methylPREDNISolone (SOLU-MEDROL) injection  40 mg Intravenous Q12H  . midodrine  5 mg Per Tube BID WC  . pantoprazole sodium  40 mg Per Tube QHS  . sodium chloride flush  10-40 mL Intracatheter Q12H  . sulfamethoxazole-trimethoprim  1 tablet Per Tube Once per day on Mon Wed Fri   Continuous Infusions: . dextrose 5 % and 0.9% NaCl 50 mL/hr at 09/02/20 0603  . feeding supplement (OSMOLITE 1.5 CAL) 55 mL/hr at 09/01/20 1756  . levETIRAcetam 1,500 mg (09/02/20 8250)     LOS: 25 days     Cordelia Poche, MD [facility] 09/02/2020, 12:10 PM  If 7PM-7AM, please contact night-coverage [email or web address]

## 2020-09-02 NOTE — Consult Note (Signed)
Chief Complaint: Patient was seen in consultation today for percutaneous gastrostomy placement.  Referring Physician(s): Cordelia Poche  Supervising Physician: Ruthann Cancer  Patient Status: [facility] - In-pt  History of Present Illness: Cameron Hale is a 44 y.o. male with a past medical history significant for seizures, vestibular schwannoma, neurofibromatosis type 2 and quadriplegia who presented to [facility] ED on 08/08/20 with AMS after having a seizure. He was found to be hypotensive with imaging findings concerning for aspiration pneumonia. He was admitted for further management and ultimately required intubation and mechanical ventilation with extubation on 10/4. He has had persistent dysphagia s/p Cortrak placement 10/6 - IR has been consulted for possible g-tube placement.  Patient seen in room, father at bedside who reports that patient has been in the hospital for most of this year after undergoing neurosurgery in March where he developed functional quadriplegia, blindness and hearing loss after the procedure. He was in the hospital until August and then came home to live with them. During his previous hospital stay he had trouble with swallowing as well but this improved at the time of discharge and he was able to eat things like chicken and hamburgers at home as long as they were cut up very small. They are both reluctant to proceed with g-tube placement but are agreeable, they are aware this can be removed at a later time if swallowing function improves.  Past Medical History:  Diagnosis Date  . Hard of hearing   . NF2 (neurofibromatosis 2) ([place])   . Pressure ulcer   . Quadriplegia ([place])   . Seizures ([place])   . Vestibular schwannoma [facility])     Past Surgical History:  Procedure Laterality Date  . brain bleed     mri 2009  . HAND EXPLORATION    . LUMBAR FUSION    . NEPHRECTOMY     Partial left nephrectomy   . SPINAL FUSION      Allergies: Amoxicillin and  Morphine  Medications: Prior to Admission medications   Medication Sig Start Date End Date Taking? Authorizing Provider  acetaminophen (TYLENOL) 325 MG tablet Take 2 tablets (650 mg total) by mouth every 6 (six) hours as needed for mild pain or fever. 06/05/20  Yes Angiulli, Lavon Paganini, PA-C  baclofen (LIORESAL) 10 MG tablet Take 1 tablet (10 mg total) by mouth 2 (two) times daily. 06/14/20  Yes Lovorn, Megan, MD  brigatinib (ALUNBRIG) 90 & 180 MG TBPK Take 90-180 mg by mouth daily.  06/07/20  Yes [provider]  carboxymethylcellulose (REFRESH TEARS) 0.5 % SOLN Place 1 drop into both eyes daily as needed (For dry eyes).   Yes [provider]  ciprofloxacin (CIPRO) 500 MG tablet Take 500 mg by mouth 2 (two) times daily.  08/01/20  Yes [provider]  dexamethasone (DECADRON) 2 MG tablet Take 2 mg by mouth daily.  06/21/20  Yes [provider]  famotidine (PEPCID) 20 MG tablet Take 1 tablet (20 mg total) by mouth 2 (two) times daily. 07/21/20  Yes Lovorn, Jinny Blossom, MD  levETIRAcetam (KEPPRA) 500 MG tablet Take 3 tablets (1,500 mg total) by mouth 2 (two) times daily. 06/05/20 08/08/20 Yes Angiulli, Lavon Paganini, PA-C  propranolol (INDERAL) 10 MG tablet Take 1 tablet (10 mg total) by mouth 2 (two) times daily. 06/05/20  Yes Angiulli, Lavon Paganini, PA-C  ammonium lactate (AMLACTIN) 12 % lotion Apply 1 application topically as needed for dry skin. Patient not taking: Reported on 08/08/2020 06/28/20   Boneta Lucks  P, DPM  bisacodyl (DULCOLAX) 10 MG suppository Place 1 suppository (10 mg total) rectally daily at 6 PM. Patient not taking: Reported on 08/08/2020 06/05/20   Angiulli, Lavon Paganini, PA-C     No family history on file.  Social History   Socioeconomic History  . Marital status: Married    Spouse name: Not on file  . Number of children: Not on file  . Years of education: Not on file  . Highest education level: Not on file  Occupational History  . Not on file  Tobacco Use  .  Smoking status: Never Smoker  . Smokeless tobacco: Never Used  Vaping Use  . Vaping Use: Never used  Substance and Sexual Activity  . Alcohol use: Not Currently    Alcohol/week: 0.0 standard drinks  . Drug use: Never  . Sexual activity: Not Currently  Other Topics Concern  . Not on file  Social History Narrative  . Not on file   Social Determinants of Health   Financial Resource Strain:   . Difficulty of Paying Living Expenses: Not on file  Food Insecurity:   . Worried About Charity fundraiser in the Last Year: Not on file  . Ran Out of Food in the Last Year: Not on file  Transportation Needs:   . Lack of Transportation (Medical): Not on file  . Lack of Transportation (Non-Medical): Not on file  Physical Activity:   . Days of Exercise per Week: Not on file  . Minutes of Exercise per Session: Not on file  Stress:   . Feeling of Stress : Not on file  Social Connections:   . Frequency of Communication with Friends and Family: Not on file  . Frequency of Social Gatherings with Friends and Family: Not on file  . Attends Religious Services: Not on file  . Active Member of Clubs or Organizations: Not on file  . Attends Archivist Meetings: Not on file  . Marital Status: Not on file     Review of Systems: A 12 point ROS discussed and pertinent positives are indicated in the HPI above.  All other systems are negative.  Review of Systems  Constitutional: Negative for chills and fever.  HENT: Positive for trouble swallowing.   Respiratory: Negative for cough and shortness of breath.   Cardiovascular: Negative for chest pain.  Gastrointestinal: Negative for abdominal pain, nausea and vomiting.  Neurological: Negative for dizziness and headaches.    Vital Signs: BP 99/61 (BP Location: Left Arm)   Pulse 97   Temp 99.9 F (37.7 C)   Resp 18   Ht 5\' 10"  (1.778 m)   Wt 159 lb 9.8 oz (72.4 kg) Comment: bed has no weighing scale,pt previous weight  SpO2 94%   BMI  22.90 kg/m   Physical Exam Vitals and nursing note reviewed.  Constitutional:      General: He is not in acute distress. HENT:     Head: Normocephalic.     Mouth/Throat:     Mouth: Mucous membranes are moist.     Pharynx: Oropharynx is clear. No oropharyngeal exudate or posterior oropharyngeal erythema.  Cardiovascular:     Rate and Rhythm: Normal rate and regular rhythm.  Pulmonary:     Effort: Pulmonary effort is normal.     Breath sounds: Normal breath sounds.  Abdominal:     General: There is no distension.     Palpations: Abdomen is soft.     Tenderness: There is no abdominal tenderness.  Skin:    General: Skin is warm and dry.  Neurological:     Mental Status: He is alert and oriented to person, place, and time.  Psychiatric:        Mood and Affect: Mood normal.        Behavior: Behavior normal.        Thought Content: Thought content normal.        Judgment: Judgment normal.      MD Evaluation Airway: WNL Heart: WNL Abdomen: WNL Chest/ Lungs: WNL ASA  Classification: 3 Mallampati/Airway Score: Two   Imaging: EEG  Result Date: 08/12/2020 Lora Havens, MD     08/12/2020  2:56 PM Patient Name: BAO BAZEN [name]: [id number] Epilepsy Attending: Lora Havens Referring Physician/Provider: Dr Ina Homes Date: 08/12/2020 Duration: 24.36 mins Patient history: 44 year old man with extensive neurofibromatosis 2 lesions in the brain and C-spine who is quadriplegic after a spine surgery, has a history of seizure disorder with worsening mentation. EEG to evaluate for seizure. Level of alertness:  lethargic AEDs during EEG study: LEV Technical aspects: This EEG study was done with scalp electrodes positioned according to the 10-20 International system of electrode placement. Electrical activity was acquired at a sampling rate of 500Hz  and reviewed with a high frequency filter of 70Hz  and a low frequency filter of 1Hz . EEG data were recorded continuously and digitally  stored. Description: No posterior dominant rhythm was seen. EEG showed continuous generalized 3 to 6 Hz theta-delta slowing admixed with intermittent 8-9hz  alpha activity predominantly in posterior head region. Hyperventilation and photic stimulation were not performed.    ABNORMALITY -Continuous slow, generalized  IMPRESSION: This study is suggestive of moderate diffuse encephalopathy, nonspecific etiology. No seizures or epileptiform discharges were seen throughout the recording.  Lora Havens   CT ABDOMEN WO CONTRAST  Result Date: 09/02/2020 CLINICAL DATA:  44 year old male with history of quadriplegia, type 2 neurofibromatosis, and seizures. Evaluate for possible gastrostomy tube placement. EXAM: CT ABDOMEN WITHOUT CONTRAST TECHNIQUE: Multidetector CT imaging of the abdomen was performed following the standard protocol without IV contrast. COMPARISON:  None. FINDINGS: Lower chest: Small bilateral pleural effusions, right greater than left with associated bibasilar passive atelectasis. Streaky regions of hyperattenuation in the right lung base as could be seen with sequela of aspiration versus calcifications due to chronic atelectasis. Hepatobiliary: The left lobe of the liver is relatively atrophic. The liver is otherwise normal in size, contour, and attenuation. The gallbladder is present and unremarkable. No intra or extrahepatic biliary ductal dilation. Pancreas: Unremarkable. No pancreatic ductal dilatation or surrounding inflammatory changes. Spleen: Normal in size without focal abnormality. Adrenals/Urinary Tract: The adrenal glands are normal in size and morphology bilaterally. The kidneys are symmetric in size bilaterally. About the anterior and superior left capsule is irregular appearing focal calcification measuring approximately 6 mm in maximum diameter of indeterminate etiology, however favored represent sequela of prior trauma or instrumentation versus calcification of prior hemorrhagic  cyst. The dome of the bladder is visualized partially. Stomach/Bowel: High-density material in the dependent portion of the stomach, favored represent enteric contrast administered the prior day during swallowing study. Weighted tip enteric tube is in place with the tip terminating in the first portion of the duodenum. The visualized portion of the small bowel is normal in course and caliber. There is residual enteric contrast material within the visualize ascending transverse and descending colon which is normal in appearance. No significant bowel wall thickening or surrounding inflammatory changes. The transverse colon is in  close proximity to the gastric body and antrum, however would likely separate with insufflation of the stomach. Vascular/Lymphatic: No significant vascular findings are present. No enlarged abdominal or pelvic lymph nodes. Other: No abdominal wall hernia or abnormality. Musculoskeletal: Status post posterior spinal instrumented fusion throughout the visualized spine sparing the L2 and S1 vertebral bodies. No evidence of hardware complication. No acute osseous abnormality. IMPRESSION: 1. Percutaneous gastrostomy tube placement is likely favorable with adequate insufflation of the stomach. Recommend enteric contrast administration 24-48 hours prior to placement for opacification of the overlying transverse colon. 2. No acute abnormality in the abdomen. 3. Small bilateral pleural effusions, right greater than left. Ruthann Cancer, MD [facility] Electronically Signed   By: Ruthann Cancer MD   On: 09/02/2020 09:18   CT ANGIO HEAD W OR WO CONTRAST  Result Date: 08/14/2020 CLINICAL DATA:  Follow-up examination for stroke. EXAM: CT ANGIOGRAPHY HEAD AND NECK TECHNIQUE: Multidetector CT imaging of the head and neck was performed using the standard protocol during bolus administration of intravenous contrast. Multiplanar CT image  reconstructions and MIPs were obtained to evaluate the vascular anatomy. Carotid stenosis measurements (when applicable) are obtained utilizing NASCET criteria, using the distal internal carotid diameter as the denominator. CONTRAST:  49mL OMNIPAQUE IOHEXOL 350 MG/ML SOLN COMPARISON:  Prior MRI from 08/13/2020. FINDINGS: CT HEAD FINDINGS Brain: Extensive changes of NF 2 again noted, fully described on prior studies. Recently identified ischemic infarcts better seen on recent brain MRI, grossly stable. No significant mass effect, hemorrhagic transformation, or other complication. No other definite new acute intracranial abnormality. No intracranial hemorrhage. No midline shift or hydrocephalus. No extra-axial fluid collection. Vascular: No hyperdense vessel. Skull: Stable.  No new finding identified. Sinuses: Partial opacification of the right frontal sinus, with scattered mucosal thickening within the ethmoidal air cells and maxillary sinuses. Left frontal osteoma again noted. Moderate bilateral mastoid effusions. Orbits: Unchanged.  No new or acute finding. Review of the MIP images confirms the above findings CTA NECK FINDINGS Aortic arch: Visualized aortic arch normal in caliber with normal branch pattern. No hemodynamically significant stenosis seen about the origin of the great vessels. Right carotid system: Right common and internal carotid arteries widely patent without stenosis, dissection or occlusion. Left carotid system: Left common and internal carotid arteries widely patent without stenosis, dissection or occlusion. Vertebral arteries: Both vertebral arteries arise from the subclavian arteries. Vertebral arteries widely patent within the neck without stenosis, dissection or occlusion. Skeleton: No visible acute osseous abnormality. Posterior Harrington fixation rods partially visualized at the upper thoracic spine. Osseous structures are diffusely heterogeneous in appearance without definite discrete  lytic or blastic osseous lesions. Other neck: Endotracheal and enteric tubes in place. No other acute soft tissue abnormality within the neck. Upper chest: Multifocal parenchymal opacity noted within the visualized lungs, most pronounced at the posterior right upper lobe, concerning for multifocal infection. Large heterogeneous layering left pleural effusion partially visualized. Right central venous catheter in place. Review of the MIP images confirms the above findings CTA HEAD FINDINGS Anterior circulation: Petrous segments patent bilaterally. Mild smooth narrowing at the para clinoid right ICA. Both internal carotid arteries otherwise widely patent to the termini. A1 segments patent bilaterally. Normal anterior communicating artery complex. Anterior cerebral arteries are markedly diminutive but patent to their distal aspects without stenosis or occlusion. Similarly, M1 segments are diminutive but patent as well. Negative MCA bifurcations. Distal MCA branches well perfused bilaterally. Posterior circulation: Diminutive vertebral arteries patent to the vertebrobasilar  junction without stenosis. Left vertebral artery slightly dominant. Patent right PICA. Left PICA not seen. Markedly diminutive basilar artery remains patent to its distal aspect. Superior cerebral arteries patent bilaterally. Fetal type origin of the PCAs supplied via bilateral posterior communicating arteries. Both PCAs are well perfused to their distal aspects. Venous sinuses: Not well evaluated due to timing the contrast bolus. Suspected superior sagittal sinus occlusion at the level of the parafalcine meningioma. Anatomic variants: None significant. No appreciable intracranial aneurysm. Review of the MIP images confirms the above findings IMPRESSION: CT HEAD IMPRESSION: 1. No significant interval change of multifocal evolving ischemic infarcts, better evaluated on recent brain MRI. No hemorrhagic transformation or other complication. 2. Extensive  underlying changes related to NF2, stable. No new intracranial abnormality. CTA HEAD AND NECK IMPRESSION: 1. Negative CTA for large vessel occlusion. No hemodynamically significant or correctable stenosis. The intracranial circulation is diffusely diminutive but remains patent. 2. Multifocal opacities throughout the visualized lungs, concerning for multifocal infection/pneumonia. 3. Large layering left pleural effusion, partially visualized. Electronically Signed   By: Jeannine Boga M.D.   On: 08/14/2020 04:27   DG Chest 1 View  Result Date: 08/16/2020 CLINICAL DATA:  Check endotracheal tube placement EXAM: CHEST  1 VIEW COMPARISON:  08/14/2020 FINDINGS: Cardiac shadow is stable. Postsurgical changes are again seen. Endotracheal tube, gastric catheter and right-sided PICC line are again seen and stable. Right-sided pigtail catheter is noted as well. Small left-sided pleural effusion is seen. No focal confluent infiltrate is noted. IMPRESSION: New left-sided pleural effusion when compare with the prior exam. Tubes and lines as described. Electronically Signed   By: Inez Catalina M.D.   On: 08/16/2020 08:14   DG Chest 1 View  Result Date: 08/13/2020 CLINICAL DATA:  Hypertension. EXAM: CHEST  1 VIEW COMPARISON:  August 12, 2020 FINDINGS: Status post intubation. Endotracheal tube in satisfactory position. Stable position of right PICC line. Placement of enteric catheter which coils on itself over the expected location of the gastric cardia. Right-sided drainage catheter overlies the right lower thorax. Cardiomediastinal silhouette is normal. Mediastinal contours appear intact. No evidence of pneumothorax. Patchy bilateral areas of reticular airspace consolidation. Stable mild elevation right hemidiaphragm may represent sub pulmonic effusion. Osseous structures are without acute abnormality. Soft tissues are grossly normal. IMPRESSION: 1. Status post intubation with satisfactory position of the  endotracheal tube. 2. Enteric catheter coils on itself over the expected location of the gastric cardia. 3. Patchy bilateral areas of reticular airspace consolidation may represent multifocal pneumonia or aspiration. Electronically Signed   By: Fidela Salisbury M.D.   On: 08/13/2020 12:20   DG Chest 1 View  Result Date: 08/12/2020 CLINICAL DATA:  Acute respiratory failure. EXAM: CHEST  1 VIEW COMPARISON:  08/11/2020 FINDINGS: Heart size remains normal. Right arm PICC line remains in appropriate position. Posterior spinal fusion hardware again seen. Moderate symmetric bilateral airspace disease shows no significant change. Moderate right and small left pleural effusions are again seen, without significant change allowing for differences in semi-erect positioning on current exam. Increased atelectasis or infiltrate noted in right lung base. No pneumothorax visualized. IMPRESSION: Increased right basilar atelectasis versus infiltrate. Otherwise stable symmetric bilateral airspace disease, and right greater than left pleural effusions. Electronically Signed   By: Marlaine Hind M.D.   On: 08/12/2020 11:48   DG Abd 1 View  Result Date: 08/14/2020 CLINICAL DATA:  OG tube placement EXAM: ABDOMEN - 1 VIEW COMPARISON:  08/12/2020 FINDINGS: Enteric tube is present with tip in the left upper  quadrant consistent with location in the upper stomach. Pigtail catheter on the right, likely in the posterior right pleural space. Postoperative fixation of the thoracolumbar spine. Scattered gas within the colon. No small or large bowel distention. IMPRESSION: Enteric tube tip is in the left upper quadrant consistent with location in the upper stomach. Electronically Signed   By: Lucienne Capers M.D.   On: 08/14/2020 02:00   DG Abd 1 View  Result Date: 08/12/2020 CLINICAL DATA:  Orogastric tube placement. EXAM: ABDOMEN - 1 VIEW COMPARISON:  None. FINDINGS: Limited view of the upper abdomen demonstrates enteric catheter  which overlies the expected location of the stomach and terminates in the location of the gastric cardia. Nonspecific gaseous distension of small bowel loops in the abdomen. Partially visualized opacification of the right lower hemithorax. IMPRESSION: Enteric catheter terminates in the expected location of the gastric cardia. Electronically Signed   By: Fidela Salisbury M.D.   On: 08/12/2020 19:14   CT HEAD WO CONTRAST  Result Date: 08/13/2020 CLINICAL DATA:  Initial evaluation for acute mental status change. 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 EXAM: CT HEAD WITHOUT CONTRAST TECHNIQUE: Contiguous axial images were obtained from the base of the skull through the vertex without intravenous contrast. COMPARISON:  Prior CT from 08/08/2020 as well as previous MRI from 05/24/2020. FINDINGS: Brain: Examination degraded by motion artifact. Known extensive meningiomatosis again seen, with most notable finding including a dominant parafalcine component. Scattered en plaque meningiomas overlying the cerebral convexities, with additional numerous supratentorial infratentorial meningiomas again seen, not significantly changed from previous. Associated bifrontal edema appears not significantly changed allowing for differences in technique and motion artifact. Few scattered parenchymal calcifications again noted. Herniation of the right gyrus rectus into the sella again noted, grossly stable. No acute intracranial hemorrhage. There is question of an increase focal hypodensity measuring approximately 1.3 cm at the posterior limb of the right internal capsule (series 5, image 52), not entirely certain given the motion degradation on this exam. An evolving acute ischemic infarct is difficult to exclude. Gray-white matter differentiation otherwise maintained with no other acute large vessel territory infarct identified. No midline shift or hydrocephalus. No extra-axial fluid collection. Vascular: No hyperdense vessel.  Skull: Sequelae of previous bifrontal craniotomy again noted. EEG leads overlie the scalp. Sinuses/Orbits: Calcifications along the optic nerve sheaths most likely reflects small meningiomas. Globes and orbital soft tissues demonstrate no other acute finding. Osteoma at the left frontoethmoidal recess again noted. Mild mucoperiosteal thickening noted throughout the remaining paranasal sinuses. Increased moderate bilateral mastoid effusions from prior. Other: None. IMPRESSION: 1. Motion degraded exam. 2. Question an approximate 1.3 cm area of increased hypodensity involving the posterior limb of the right internal capsule. While this finding may be artifactual in nature given motion artifact on this exam, a possible evolving ischemic infarct is difficult to exclude, and could be considered in the correct clinical setting. 3. No other new acute intracranial abnormality. 4. Underlying extensive changes related to NF 2 with meningiomatosis and associated bifrontal edema, stable. Electronically Signed   By: Jeannine Boga M.D.   On: 08/13/2020 02:48   CT Head Wo Contrast  Result Date: 08/08/2020 CLINICAL DATA:  Cerebral hemorrhage suspected. Additional history provided: History of NF 2, history of seizures, last seizure yesterday, decreased full of consciousness, low blood pressure. EXAM: CT HEAD WITHOUT CONTRAST TECHNIQUE: Contiguous axial images were obtained from the base of the skull through the vertex without intravenous contrast. COMPARISON:  Prior brain MRI 05/24/2020.  Prior head CT  09/10/2017. FINDINGS: Brain: Known extensive meningiomatosis was more fully characterized on the brain MRI of 05/24/2020. Most notably, there is a dominant parafalcine meningioma as well as extensive en plaque meningiomas greatest overlying the anterior and superior cerebral convexities. Numerous additional smaller supratentorial and infratentorial meningiomas were better appreciated on the prior MRI. A right vestibular  schwannoma was also better appreciated on this prior study. There is limited assessment for interval change in size of these masses due to noncontrast technique on the current study and differences in modality. Prominent vasogenic edema within the bilateral mid to anterior frontal lobes and crossing the corpus callosum does not appear significantly changed. Redemonstrated small nonspecific scattered foci of parenchymal calcification. Questionable asymmetric enlargement of the right hippocampus was better appreciated on the prior MRI. As before, there is partial herniation of the posterior rectus gyri into the sella turcica. There is no acute intracranial hemorrhage. No demarcated cortical infarct is identified. No extra-axial fluid collection. No midline shift. Vascular: No hyperdense vessel.  Atherosclerotic calcifications. Skull: Sequela of prior bifrontal parietal craniotomy/cranioplasty. Sinuses/Orbits: Redemonstrated small foci of calcification along the right greater than left intraorbital optic nerve sheaths suspicious for optic nerve sheath meningiomas. Mild right frontal sinus mucosal thickening. 16 mm osteoma within the left frontoethmoidal recess. No significant mastoid effusion. IMPRESSION: No evidence of acute intracranial hemorrhage or acute demarcated cortical infarct. Extensive meningiomatosis more fully characterized on the recent prior brain MRI of 05/24/2020. Most notably, this includes a large parafalcine meningioma as well as extensive en plaque meningiomas greatest along the anterior and superior cerebral convexities. Unchanged prominent vasogenic edema within the underlying mid-to-anterior frontal lobes and crossing the corpus callosum. As before, there is herniation of the rectus gyri into the sella turcica. 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 A known right  vestibular schwannoma was better appreciated on the prior MRI. Questionable asymmetric enlargement of the right hippocampus was also better appreciated on the prior MRI. Electronically Signed   By: Kellie Simmering DO   On: 08/08/2020 14:07   CT ANGIO NECK W OR WO CONTRAST  Result Date: 08/14/2020 CLINICAL DATA:  Follow-up examination for stroke. EXAM: CT ANGIOGRAPHY HEAD AND NECK TECHNIQUE: Multidetector CT imaging of the head and neck was performed using the standard protocol during bolus administration of intravenous contrast. Multiplanar CT image reconstructions and MIPs were obtained to evaluate the vascular anatomy. Carotid stenosis measurements (when applicable) are obtained utilizing NASCET criteria, using the distal internal carotid diameter as the denominator. CONTRAST:  85mL OMNIPAQUE IOHEXOL 350 MG/ML SOLN COMPARISON:  Prior MRI from 08/13/2020. FINDINGS: CT HEAD FINDINGS Brain: Extensive changes of NF 2 again noted, fully described on prior studies. Recently identified ischemic infarcts better seen on recent brain MRI, grossly stable. No significant mass effect, hemorrhagic transformation, or other complication. No other definite new acute intracranial abnormality. No intracranial hemorrhage. No midline shift or hydrocephalus. No extra-axial fluid collection. Vascular: No hyperdense vessel. Skull: Stable.  No new finding identified. Sinuses: Partial opacification of the right frontal sinus, with scattered mucosal thickening within the ethmoidal air cells and maxillary sinuses. Left frontal osteoma again noted. Moderate bilateral mastoid effusions. Orbits: Unchanged.  No new or acute finding. Review of the MIP images confirms the above findings CTA NECK FINDINGS Aortic arch: Visualized aortic arch normal in caliber with normal branch pattern. No hemodynamically significant stenosis seen about the origin of the great vessels. Right carotid system: Right common  and internal carotid arteries widely patent  without stenosis, dissection or occlusion. Left carotid system: Left common and internal carotid arteries widely patent without stenosis, dissection or occlusion. Vertebral arteries: Both vertebral arteries arise from the subclavian arteries. Vertebral arteries widely patent within the neck without stenosis, dissection or occlusion. Skeleton: No visible acute osseous abnormality. Posterior Harrington fixation rods partially visualized at the upper thoracic spine. Osseous structures are diffusely heterogeneous in appearance without definite discrete lytic or blastic osseous lesions. Other neck: Endotracheal and enteric tubes in place. No other acute soft tissue abnormality within the neck. Upper chest: Multifocal parenchymal opacity noted within the visualized lungs, most pronounced at the posterior right upper lobe, concerning for multifocal infection. Large heterogeneous layering left pleural effusion partially visualized. Right central venous catheter in place. Review of the MIP images confirms the above findings CTA HEAD FINDINGS Anterior circulation: Petrous segments patent bilaterally. Mild smooth narrowing at the para clinoid right ICA. Both internal carotid arteries otherwise widely patent to the termini. A1 segments patent bilaterally. Normal anterior communicating artery complex. Anterior cerebral arteries are markedly diminutive but patent to their distal aspects without stenosis or occlusion. Similarly, M1 segments are diminutive but patent as well. Negative MCA bifurcations. Distal MCA branches well perfused bilaterally. Posterior circulation: Diminutive vertebral arteries patent to the vertebrobasilar junction without stenosis. Left vertebral artery slightly dominant. Patent right PICA. Left PICA not seen. Markedly diminutive basilar artery remains patent to its distal aspect. Superior cerebral arteries patent bilaterally. Fetal type origin of the PCAs supplied via bilateral posterior communicating  arteries. Both PCAs are well perfused to their distal aspects. Venous sinuses: Not well evaluated due to timing the contrast bolus. Suspected superior sagittal sinus occlusion at the level of the parafalcine meningioma. Anatomic variants: None significant. No appreciable intracranial aneurysm. Review of the MIP images confirms the above findings IMPRESSION: CT HEAD IMPRESSION: 1. No significant interval change of multifocal evolving ischemic infarcts, better evaluated on recent brain MRI. No hemorrhagic transformation or other complication. 2. Extensive underlying changes related to NF2, stable. No new intracranial abnormality. CTA HEAD AND NECK IMPRESSION: 1. Negative CTA for large vessel occlusion. No hemodynamically significant or correctable stenosis. The intracranial circulation is diffusely diminutive but remains patent. 2. Multifocal opacities throughout the visualized lungs, concerning for multifocal infection/pneumonia. 3. Large layering left pleural effusion, partially visualized. Electronically Signed   By: Jeannine Boga M.D.   On: 08/14/2020 04:27   MR BRAIN WO CONTRAST  Result Date: 08/13/2020 CLINICAL DATA:  Anoxic brain injury. EXAM: MRI HEAD WITHOUT CONTRAST TECHNIQUE: Multiplanar, multiecho pulse sequences of the brain and surrounding structures were obtained without intravenous contrast. COMPARISON:  08/13/2020 head CT and prior. 05/24/2020 MRI head and prior. FINDINGS: Lack of intravenous contrast limits evaluation. Brain: New cortically based restricted diffusion involving the bilateral frontal lobes most prominent medially. Small focal restricted diffusion along the superior vermis (5:73). Left frontal white matter microhemorrhages are unchanged. Redemonstration of extensive meningiomatosis with with additional meningiomas overlying the bilateral cerebral convexities and infratentorially. Dominant parafalcine meningioma with intralesional SWI signal dropout is grossly unchanged in  size. Bifrontal perilesional edema is also unchanged. No midline shift, ventriculomegaly or extra-axial fluid collection. No definite new or enlarging mass lesions. Vascular: Major intracranial flow voids are patent proximally. Skull and upper cervical spine: Heterogeneity of the bone marrow signal most prominent at the vertex is unchanged. Sinuses/Orbits: Normal orbits. Sequela of chronic right sphenoid and left frontal allergic fungal sinusitis. Bilateral mastoid effusions. Other: None. IMPRESSION: Cortically based acute infarcts  involving the bilateral frontal lobes most prominent in the ACA territory. Small superior vermian acute infarct. Chronic sequela of NF 2, grossly unchanged. No new or enlarging intracranial lesions however lack of intravenous contrast limits evaluation. Dominant parafalcine meningioma and bifrontal edema, grossly unchanged in size. These results were called by telephone at the time of interpretation on 08/13/2020 at 4:56 pm to provider [facility] , who verbally acknowledged these results. Electronically Signed   By: Primitivo Gauze M.D.   On: 08/13/2020 16:58   DG CHEST PORT 1 VIEW  Result Date: 08/20/2020 CLINICAL DATA:  44 year old male with respiratory failure, hypoxia. EXAM: PORTABLE CHEST 1 VIEW COMPARISON:  Portable chest 08/19/2020 and earlier. FINDINGS: Portable AP semi upright view at 1224 hours. Stable endotracheal tube tip below the clavicles. Enteric tube side hole is at the level of the gastric body. Stable right PICC line. Lung volumes and mediastinal contours are within normal limits. Allowing for portable technique the lungs are clear. No pneumothorax or pleural effusion. Diffuse posterior spinal fusion hardware. Osteopenia. Multilevel chronic right lower rib fractures. Negative visible bowel gas pattern. IMPRESSION: 1.  Stable lines and tubes. 2.  No acute cardiopulmonary abnormality. Electronically Signed   By: Genevie Ann M.D.   On: 08/20/2020 13:56   DG CHEST  PORT 1 VIEW  Result Date: 08/19/2020 CLINICAL DATA:  Acute respiratory failure with hypoxia EXAM: PORTABLE CHEST 1 VIEW COMPARISON:  08/17/2020 FINDINGS: Similar positioning of a right PICC, endotracheal tube, and gastric tube. Cardiomediastinal silhouette is within normal limits. Improved medial left lower lobe opacity. No discernible pneumothorax. Polyarticular degenerative change. Partially imaged thoracolumbar fusion. IMPRESSION: 1. Improved medial left lower lobe opacity. No new cardiopulmonary abnormality. 2. Stable positioning of support devices. Electronically Signed   By: Margaretha Sheffield MD   On: 08/19/2020 09:02   DG CHEST PORT 1 VIEW  Result Date: 08/17/2020 CLINICAL DATA:  Encounter for chest tube removal EXAM: PORTABLE CHEST 1 VIEW COMPARISON:  Portable exam 1346 hours compared to 0530 hours FINDINGS: Endotracheal tube, nasogastric tube, and RIGHT arm PICC line unchanged. Interval removal of pigtail RIGHT thoracostomy tube. No pneumothorax following RIGHT chest tube removal. Stable heart size and mediastinal contours. Atelectasis versus consolidation at medial LEFT lower lobe. Remaining lungs clear. No pleural effusion. Extensive spinal fixation hardware. IMPRESSION: No pneumothorax following RIGHT thoracostomy tube removal. Electronically Signed   By: Lavonia Dana M.D.   On: 08/17/2020 14:25   DG CHEST PORT 1 VIEW  Result Date: 08/17/2020 CLINICAL DATA:  Intubation.  Chest tube.  Respiratory failure. EXAM: PORTABLE CHEST 1 VIEW COMPARISON:  08/16/2020. FINDINGS: Endotracheal tube, NG tube, right PICC line, right chest tube in stable position. No pneumothorax. Heart size normal. Persistent left lower lobe infiltrate. Persistent mild right upper lobe infiltrate. No pleural effusion. Prior thoracic spine fusion. IMPRESSION: 1. Lines and tubes including right chest tube in stable position. No pneumothorax. 2. Persistent left lower lobe infiltrate. Persistent mild right upper lobe infiltrate.  Chest is unchanged from prior exam. Electronically Signed   By: Marcello Moores  Register   On: 08/17/2020 08:27   DG Chest Port 1 View  Result Date: 08/14/2020 CLINICAL DATA:  Intubation.  Chest tube. EXAM: PORTABLE CHEST 1 VIEW COMPARISON:  Chest x-ray 08/13/2020. FINDINGS: Endotracheal tube, NG tube, right PICC line, right chest tube in stable position. No pneumothorax. Heart size stable. Patchy bilateral pulmonary infiltrates are again noted without significant change. No pleural effusion. Prior thoracic spine fusion. IMPRESSION: 1. Lines and tubes including right chest tube  in stable position. No pneumothorax. 2. Patchy bilateral pulmonary infiltrates again noted without significant change. Electronically Signed   By: Marcello Moores  Register   On: 08/14/2020 06:18   DG CHEST PORT 1 VIEW  Result Date: 08/12/2020 CLINICAL DATA:  Intubation. EXAM: PORTABLE CHEST 1 VIEW COMPARISON:  Radiograph earlier this day. FINDINGS: Endotracheal tube tip 4.8 cm from the carina below the clavicular heads. Right upper extremity PICC remains in place. There is a pigtail catheter projecting over the right lung base. Possible slight decrease in right pleural effusion and improved basilar opacity from earlier today. Left pleural effusion appears improved. No visualized pneumothorax. There is worsening patchy left perihilar opacity. Unchanged heart size. Extensive thoracolumbar spinal fusion hardware. IMPRESSION: 1. Endotracheal tube tip 4.8 cm from the carina below the clavicular heads. 2. Pigtail catheter projecting over the right lung base. Slight improvement in right pleural effusion and basilar opacity from earlier today. 3. Worsening patchy left perihilar opacity, may be pulmonary edema, atelectasis or infection. Electronically Signed   By: Keith Rake M.D.   On: 08/12/2020 18:44   DG CHEST PORT 1 VIEW  Result Date: 08/11/2020 CLINICAL DATA:  Acute respiratory distress, EXAM: PORTABLE CHEST 1 VIEW COMPARISON:  Radiograph  08/08/2020 FINDINGS: Right upper extremity PICC tip terminates at the superior cavoatrial junction. There is a new moderate right pleural effusion with fluid tracking in the fissures. Suspect layering left effusion as well. Background of diffuse hazy interstitial opacities and indistinct pulmonary vascularity suggesting underlying pulmonary edema . More patchy opacity seen in the left lung, similar to prior. The cardiomediastinal contours are unremarkable. Redemonstration of the long segment thoracolumbar fusion incompletely visualized within the levels of imaging. The osseous structures appear diffusely demineralized which may limit detection of small or nondisplaced fractures. IMPRESSION: 1. Right upper extremity PICC tip terminates at the superior cavoatrial junction. 2. New moderate right pleural effusion with fluid tracking in the fissures. Suspect layering left effusion as well. Adjacent passive atelectasis. 3. Redemonstrated opacities in the left lung could reflect a pneumonia. 4. Background of diffuse hazy interstitial opacities and indistinct pulmonary vascularity suggesting underlying pulmonary edema as well. Electronically Signed   By: Lovena Le M.D.   On: 08/11/2020 22:35   DG Chest Port 1 View  Result Date: 08/08/2020 CLINICAL DATA:  Questionable sepsis.  COVID test pending. EXAM: PORTABLE CHEST 1 VIEW COMPARISON:  None. FINDINGS: Multifocal airspace opacities in the left lung. No pleural effusions. No discernible pneumothorax. Spinal stabilization hardware, partially imaged. Polyarticular degenerative change without evidence of acute fracture. IMPRESSION: Multifocal airspace opacities in the left lung, concerning for pneumonia. Recommend follow-up to resolution. Electronically Signed   By: Margaretha Sheffield MD   On: 08/08/2020 13:12   DG Swallowing Func-Speech Pathology  Result Date: 08/31/2020 Objective Swallowing Evaluation: Type of Study: MBS-Modified Barium Swallow Study  Patient  Details Name: WEILAND TOMICH [name]: [id number] Date of Birth: 04/13/1976 Today's Date: 08/31/2020 Time: SLP Start Time (ACUTE ONLY): 4098 -SLP Stop Time (ACUTE ONLY): 1032 SLP Time Calculation (min) (ACUTE ONLY): 17 min Past Medical History: Past Medical History: Diagnosis Date . Hard of hearing  . NF2 (neurofibromatosis 2) ([place])  . Pressure ulcer  . Quadriplegia ([place])  . Seizures ([place])  . Vestibular schwannoma [facility])  Past Surgical History: Past Surgical History: Procedure Laterality Date . brain bleed    mri 2009 . HAND EXPLORATION   . LUMBAR FUSION   . NEPHRECTOMY    Partial left nephrectomy  . SPINAL FUSION   HPI: Mr.  Cameron Hale is a 44 y.o. 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 Developed hypoglycemia, hypotension and increased WOB. Presents with acute hypoxic respiratory failure due to acute encephalopathy, acute strokes, chronic weakness from quadriplegia and neurofibromatosis, and acute medication induced pneumonitis. Pt intubated 9/25-10/4.  FEES 10/5-  severe, diffuse weakness across oral and pharyngeal musculature leading to delayed oral transit, spillage to pyriforms prior to the swallow and then severe residue after the swallow. Pt has bilateral errythema and indentations on posterior portion of the vocal folds. There is gradual silent aspiration of residue via the posterior commissure.  Subjective: pt alert, cooperative Assessment / Plan / Recommendation CHL IP CLINICAL IMPRESSIONS 08/31/2020 Clinical Impression Pt continues to demonstrate a severe oropharyngeal dysphagia characterized by widespread weakness. His oral phase is prolonged with reduced bolus cohesion, but leaving minimal amounts of residue behind. Pharyngeally he has limited  movement diffusely, not able to clear the majority of boluses from his pharynx and not able to sufficiently close off his airway. Pt has consistent penetration during the swallow and after because of the residue throughout his pharynx, which is also consistently aspirated. At times the amount of aspiration is more trace, but even when it becomes larger in volume, pt cannot produce a cough (volitionally or reflexively) that can clear his airway. Cues were provided to attempt hard swallows and supraglottic swallows (pt already in a chin down position), but without significant improvement in function. Overall function appears likely to be consistent with initial FEES from 11/5. These results were reviewed with pt and his mother immediately after testing, also sharing that we may have a lot of dysphagia therapy ahead of him to work on swallowing function. Would continue to provide nutrition via alternate means, although perhaps considering a longer-term option, as SLP continues to follow for dysphagia treatment. Would also still provide 4-5 ice chips at a time after oral care, at least 3x a day (reviewed with RN as well).  SLP Visit Diagnosis Dysphagia, oropharyngeal phase (R13.12) Attention and concentration deficit following -- Frontal lobe and executive function deficit following -- Impact on safety and function Severe aspiration risk   CHL IP TREATMENT RECOMMENDATION 08/31/2020 Treatment Recommendations Therapy as outlined in treatment plan below   Prognosis 08/31/2020 Prognosis for Safe Diet Advancement Fair Barriers to Reach Goals Severity of deficits Barriers/Prognosis Comment -- CHL IP DIET RECOMMENDATION 08/31/2020 SLP Diet Recommendations NPO Liquid Administration via -- Medication Administration Via alternative means Compensations -- Postural Changes --   CHL IP OTHER RECOMMENDATIONS 08/31/2020 Recommended Consults -- Oral Care Recommendations Oral care QID Other Recommendations --   CHL IP FOLLOW UP  RECOMMENDATIONS 08/31/2020 Follow up Recommendations Home health SLP;24 hour supervision/assistance   CHL IP FREQUENCY AND DURATION 08/31/2020 Speech Therapy Frequency (ACUTE ONLY) min 2x/week Treatment Duration 2 weeks      CHL IP ORAL PHASE 08/31/2020 Oral Phase Impaired Oral - Pudding Teaspoon -- Oral - Pudding Cup -- Oral - Honey Teaspoon -- Oral - Honey Cup -- Oral - Nectar Teaspoon Decreased bolus cohesion;Delayed oral transit;Weak lingual manipulation Oral - Nectar Cup NT Oral - Nectar Straw -- Oral - Thin Teaspoon Decreased bolus cohesion;Delayed oral transit;Weak lingual manipulation Oral - Thin Cup NT Oral - Thin Straw -- Oral - Puree -- Oral - Mech Soft -- Oral - Regular --  Oral - Multi-Consistency -- Oral - Pill -- Oral Phase - Comment --  CHL IP PHARYNGEAL PHASE 08/31/2020 Pharyngeal Phase Impaired Pharyngeal- Pudding Teaspoon -- Pharyngeal -- Pharyngeal- Pudding Cup -- Pharyngeal -- Pharyngeal- Honey Teaspoon -- Pharyngeal -- Pharyngeal- Honey Cup -- Pharyngeal -- Pharyngeal- Nectar Teaspoon Delayed swallow initiation-vallecula;Reduced epiglottic inversion;Reduced pharyngeal peristalsis;Reduced anterior laryngeal mobility;Reduced laryngeal elevation;Reduced airway/laryngeal closure;Reduced tongue base retraction;Pharyngeal residue - valleculae;Pharyngeal residue - pyriform;Lateral channel residue;Inter-arytenoid space residue;Compensatory strategies attempted (with notebox);Penetration/Apiration after swallow Pharyngeal Material enters airway, passes BELOW cords without attempt by patient to eject out (silent aspiration) Pharyngeal- Nectar Cup NT Pharyngeal -- Pharyngeal- Nectar Straw -- Pharyngeal -- Pharyngeal- Thin Teaspoon Delayed swallow initiation-vallecula;Reduced epiglottic inversion;Reduced pharyngeal peristalsis;Reduced anterior laryngeal mobility;Reduced laryngeal elevation;Reduced airway/laryngeal closure;Reduced tongue base retraction;Pharyngeal residue - valleculae;Pharyngeal residue -  pyriform;Lateral channel residue;Inter-arytenoid space residue;Compensatory strategies attempted (with notebox);Penetration/Apiration after swallow;Penetration/Aspiration during swallow Pharyngeal Material enters airway, passes BELOW cords without attempt by patient to eject out (silent aspiration) Pharyngeal- Thin Cup NT Pharyngeal -- Pharyngeal- Thin Straw -- Pharyngeal -- Pharyngeal- Puree -- Pharyngeal -- Pharyngeal- Mechanical Soft -- Pharyngeal -- Pharyngeal- Regular -- Pharyngeal -- Pharyngeal- Multi-consistency -- Pharyngeal -- Pharyngeal- Pill -- Pharyngeal -- Pharyngeal Comment --  CHL IP CERVICAL ESOPHAGEAL PHASE 08/31/2020 Cervical Esophageal Phase WFL Pudding Teaspoon -- Pudding Cup -- Honey Teaspoon -- Honey Cup -- Nectar Teaspoon -- Nectar Cup -- Nectar Straw -- Thin Teaspoon -- Thin Cup -- Thin Straw -- Puree -- Mechanical Soft -- Regular -- Multi-consistency -- Pill -- Cervical Esophageal Comment -- [name], M.A. [facility] Pager [phone] Office [phone] 08/31/2020, 11:50 AM              EEG adult  Result Date: 08/09/2020 Lora Havens, MD     08/09/2020 11:38 AM Patient Name: Cameron Hale [name]: [id number] Epilepsy Attending: Lora Havens Referring Physician/Provider: Dr Sander Radon Date: 08/09/2020 Duration: 26.05 mins Patient history: 44 year old male with functional quadriparesis, hearing and visual loss with history of neurofibromatosis type II and seizures with prior event of status epilepticus who presents with altered mentation.  24 hours ago he was noted to have a generalized seizure that recovered by itself. EEG to evaluate for seizure. Level of alertness: Awake, asleep AEDs during EEG study: Keppra Technical aspects: This EEG study was done with scalp electrodes positioned according to the 10-20 International system of electrode placement. Electrical activity was acquired at a sampling rate of 500Hz  and reviewed with a high frequency  filter of 70Hz  and a low frequency filter of 1Hz . EEG data were recorded continuously and digitally stored. Description: No posterior dominant rhythm was seen. Sleep was characterized by vertex waves, maximal frontocentral region. EEG showed continuous generalized 3 to 6 Hz theta-delta slowing. Hyperventilation and photic stimulation were not performed.   ABNORMALITY -Continuous slow, generalized IMPRESSION: This study is suggestive of moderate diffuse encephalopathy, nonspecific etiology. No seizures or epileptiform discharges were seen throughout the recording. Lora Havens   ECHOCARDIOGRAM COMPLETE  Result Date: 08/14/2020    ECHOCARDIOGRAM REPORT   Patient Name:   Kamauri BILAL MANZER Date of Exam: 08/14/2020 Medical Rec #:  [id number]      Height:       70.0 in Accession #:    [id number]     Weight:       159.6 lb Date of Birth:  July 13, 1976      BSA:          1.897 m Patient Age:    70 years  BP:           175/144 mmHg Patient Gender: M              HR:           70 bpm. Exam Location:  Inpatient Procedure: 2D Echo, Cardiac Doppler, Color Doppler and Intracardiac            Opacification Agent Indications:    Stroke 434.91  History:        Patient has no prior history of Echocardiogram examinations.                 Risk Factors:Non-Smoker.  Sonographer:    Vickie Epley RDCS Referring Phys: [id number] ASHISH ARORA  Sonographer Comments: Technically difficult study due to poor echo windows and echo performed with patient supine and on artificial respirator. IMPRESSIONS  1. Left ventricular ejection fraction, by estimation, is 60 to 65%. The left ventricle has normal function. The left ventricle has no regional wall motion abnormalities. Left ventricular diastolic parameters were normal.  2. Right ventricular systolic function is normal. The right ventricular size is normal. There is normal pulmonary artery systolic pressure.  3. The mitral valve is normal in structure. No evidence of mitral valve regurgitation.  No evidence of mitral stenosis.  4. The aortic valve is normal in structure. Aortic valve regurgitation is not visualized. No aortic stenosis is present.  5. The inferior vena cava is normal in size with greater than 50% respiratory variability, suggesting right atrial pressure of 3 mmHg. Comparison(s): No prior Echocardiogram. Conclusion(s)/Recommendation(s): No intracardiac source of embolism detected on this transthoracic study. A transesophageal echocardiogram is recommended to exclude cardiac source of embolism if clinically indicated. FINDINGS  Left Ventricle: Left ventricular ejection fraction, by estimation, is 60 to 65%. The left ventricle has normal function. The left ventricle has no regional wall motion abnormalities. Definity contrast agent was given IV to delineate the left ventricular  endocardial borders. The left ventricular internal cavity size was normal in size. There is no left ventricular hypertrophy. Left ventricular diastolic parameters were normal. Right Ventricle: The right ventricular size is normal. No increase in right ventricular wall thickness. Right ventricular systolic function is normal. There is normal pulmonary artery systolic pressure. The tricuspid regurgitant velocity is 1.72 m/s, and  with an assumed right atrial pressure of 8 mmHg, the estimated right ventricular systolic pressure is 73.2 mmHg. Left Atrium: Left atrial size was normal in size. Right Atrium: Right atrial size was normal in size. Pericardium: There is no evidence of pericardial effusion. Mitral Valve: The mitral valve is normal in structure. No evidence of mitral valve regurgitation. No evidence of mitral valve stenosis. Tricuspid Valve: The tricuspid valve is normal in structure. Tricuspid valve regurgitation is mild . No evidence of tricuspid stenosis. Aortic Valve: The aortic valve is normal in structure. Aortic valve regurgitation is not visualized. No aortic stenosis is present. Pulmonic Valve: The  pulmonic valve was normal in structure. Pulmonic valve regurgitation is not visualized. No evidence of pulmonic stenosis. Aorta: The aortic root is normal in size and structure. Venous: The inferior vena cava is normal in size with greater than 50% respiratory variability, suggesting right atrial pressure of 3 mmHg. IAS/Shunts: No atrial level shunt detected by color flow Doppler.   Diastology LV e' medial:    8.59 cm/s LV E/e' medial:  6.9 LV e' lateral:   11.60 cm/s LV E/e' lateral: 5.1  RIGHT VENTRICLE RV S prime:     13.90 cm/s  TAPSE (M-mode): 1.7 cm LEFT ATRIUM           Index       RIGHT ATRIUM          Index LA Vol (A2C): 13.3 ml 7.01 ml/m  RA Area:     9.73 cm LA Vol (A4C): 31.3 ml 16.50 ml/m RA Volume:   20.70 ml 10.91 ml/m  AORTIC VALVE LVOT Vmax:   74.40 cm/s LVOT Vmean:  49.700 cm/s LVOT VTI:    0.160 m MITRAL VALVE               TRICUSPID VALVE MV Area (PHT): 4.21 cm    TR Peak grad:   11.8 mmHg MV Decel Time: 180 msec    TR Vmax:        172.00 cm/s MV E velocity: 59.60 cm/s MV A velocity: 37.70 cm/s  SHUNTS MV E/A ratio:  1.58        Systemic VTI: 0.16 m Ena Dawley MD Electronically signed by Ena Dawley MD Signature Date/Time: 08/14/2020/3:35:19 PM    Final    [place] EKG SITE RITE  Result Date: 08/11/2020 If Site Rite image not attached, placement could not be confirmed due to current cardiac rhythm.  [place] EKG SITE RITE  Result Date: 08/10/2020 If Site Rite image not attached, placement could not be confirmed due to current cardiac rhythm.  [place] EKG SITE RITE  Result Date: 08/10/2020 If Site Rite image not attached, placement could not be confirmed due to current cardiac rhythm.  US Abdomen Limited RUQ  Result Date: 08/09/2020 CLINICAL DATA:  Transaminasemia EXAM: ULTRASOUND ABDOMEN LIMITED RIGHT UPPER QUADRANT COMPARISON:  None. FINDINGS: Limited patient mobility limits evaluation. Gallbladder: No gallstones or wall thickening visualized. Incidental 2.7 mm polyp. No sonographic  Murphy sign noted by sonographer. Common bile duct: Diameter: 4.2 mm Liver: No focal lesion identified. Increased parenchymal echogenicity. Portal vein is patent on color Doppler imaging with normal direction of blood flow towards the liver. Other: None. IMPRESSION: Hepatic steatosis.  No focal hepatic lesion. Incidental 2.7 mm polyp. Otherwise unremarkable appearance of the gallbladder and bile ducts. Electronically Signed   By: Primitivo Gauze M.D.   On: 08/09/2020 11:20    Labs:  CBC: Recent Labs    08/28/20 0520 08/28/20 0520 08/30/20 0535 08/30/20 0535 09/01/20 0500 09/01/20 0922 09/01/20 2036 09/02/20 0500  WBC 8.6  --  10.4  --  10.9*  --   --  9.6  HGB 7.4*   < > 7.7*   < > 6.5* 6.6* 8.0* 7.8*  HCT 23.5*   < > 25.6*   < > 21.8* 22.0* 25.6* 25.3*  PLT 328  --  281  --  215  --   --  191   < > = values in this interval not displayed.    COAGS: Recent Labs    08/08/20 1403  INR 1.0  APTT 39*    BMP: Recent Labs    08/19/20 0500 08/19/20 0500 08/20/20 0225 08/20/20 1553 08/21/20 0445 08/21/20 0445 08/22/20 0447 08/22/20 1854 08/24/20 0416 08/26/20 0530 08/28/20 0520 09/02/20 0500  NA 141   < > 142   < > 142   < > 144   < > 149* 148* 137 141  K 4.0   < > 4.3   < > 3.7   < > 4.4   < > 4.0 4.7 4.6 4.5  CL 105   < > 107  --  109   < >  111   < > 117* 114* 103 107  CO2 25   < > 26  --  27   < > 24   < > 24 26 28 28   GLUCOSE 153*   < > 137*  --  149*   < > 84   < > 89 159* 169* 143*  BUN 41*   < > 38*  --  42*   < > 42*   < > 34* 32* 26* 31*  CALCIUM 8.0*   < > 8.3*  --  8.3*   < > 8.4*   < > 8.4* 8.3* 8.2* 7.9*  CREATININE 0.42*   < > 0.41*  --  0.34*   < > <0.30*   < > 0.45* 0.31* <0.30* 0.31*  GFRNONAA >60   < > >60  --  >60   < > NOT CALCULATED   < > >60 >60 NOT CALCULATED >60  GFRAA >60  --  >60  --  >60  --  NOT CALCULATED  --   --   --   --   --    < > = values in this interval not displayed.    LIVER FUNCTION TESTS: Recent Labs    08/19/20 0500  08/20/20 0225 08/21/20 0445 08/22/20 0447  BILITOT 0.8 0.6 0.4 0.4  AST 38 37 34 27  ALT 89* 92* 93* 91*  ALKPHOS 80 80 72 69  PROT 4.5* 4.7* 4.6* 4.7*  ALBUMIN 1.9* 1.9* 1.9* 2.0*    TUMOR MARKERS: No results for input(s): AFPTM, CEA, CA199, CHROMGRNA in the last 8760 hours.  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 He was extubated 10/4 but continues to have persistent dysphagia and SLP has recommended g-tube placement.   Patient history and imaging reviewed by Dr. Serafina Royals today who approves procedure however patient will require barium via NG 24-48 hours prior to procedure. Discussed with patient and his father at bedside that we are tentatively planning on g-tube placement Tuesday 10/19 - IR will place orders for barium to be administered, NPO/hold tube feeds at midnight 10/19.   Risks and benefits discussed with the patient including, but not limited to the need for a barium enema during the procedure, bleeding, infection, peritonitis, or damage to adjacent structures.  All of the patient's questions were answered, patient is agreeable to proceed.  Consent signed and in IR control room.  Thank you for this interesting consult.  I greatly enjoyed meeting ABRAHM MANCIA and look forward to participating in their care.  A copy of this report was sent to the requesting provider on this date.  Electronically Signed: Joaquim Nam, PA-C 09/02/2020, 11:09 AM   I spent a total of 40 Minutes in face to face in clinical consultation, greater than 50% of which was counseling/coordinating care for g tube placement.

## 2020-09-02 NOTE — Evaluation (Signed)
Physical Therapy Evaluation and Discharge (Third this admission) Patient Details Name: Cameron Hale [name]: [id number] DOB: 02/06/1976 Today's Date: 09/02/2020   History of Present Illness  44 yo admitted 9/21 with unresponsiveness and seizures. Intubated 9/25-10/4. MRI with bilcerebral frontal infarcts. PMhx: neurofibromatosis with neurosurgical intervention march 2021 with resultant quadriplegia, seizure disorder,left eye blindness, hearing loss.  He was on CIR 6/23-7/16/2021  Clinical Impression   Patient evaluated by Physical Therapy with no further acute PT needs identified. Noted OT is following patient to address use of UEs for hopeful return to assisting with driving his power chair. OT also set goal for OOB/upright tolerance for ability to sit/use power chair. Today patient's BP and HR were stable/unchanged with transition into semi-chair position via hospital bed (HOB up to 65 degrees). Patient's power chair is not here and do not feel patient is appropriate for sitting in hospital recliner (without lateral supports or cushion for minimizing risk of skin breakdown).  Patient's PROM of bil LEs remains WFL and should not prevent him from being seated in his power chair. Do feel HHPT would be warranted when pt returns home so they can assess his use of and positioning in his power chair. PT is signing off. Thank you for this referral.     Follow Up Recommendations Home health PT;Supervision/Assistance - 24 hour (to assess power chair and ?positioning needs at home)    Equipment Recommendations  None recommended by PT    Recommendations for Other Services       Precautions / Restrictions Precautions Precautions: Other (comment) Precaution Comments: quadriplegia      Mobility  Bed Mobility Overal bed mobility: Needs Assistance Bed Mobility: Rolling Rolling: Total assist            Transfers                 General transfer comment: Elevated from supine into  semi-chair position via hospital bed. Patient on air mattress and unable to achieve significant lowering of legs into dependent position, however tolerated HOB elevated to 65 degrees with BP and HR stable (see flowsheet).   Ambulation/Gait                Hotel manager mobility:  (pt's power chair is at home)  Modified Rankin (Stroke Patients Only)       Balance                                             Pertinent Vitals/Pain Pain Assessment: Faces Faces Pain Scale: No hurt    Home Living Family/patient expects to be discharged to:: Private residence Living Arrangements: Parent;Other relatives Available Help at Discharge: Family;Friend(s);Available 24 hours/day (parents primary caregivers) Type of Home: House Home Access: Ramped entrance     Home Layout: [place]: Hospital bed;Other (comment);Wheelchair - power (hoyer lift ) Additional Comments: information gathered from previous chart entry    Prior Function Level of Independence: Needs assistance   Gait / Transfers Assistance Needed: max-total assist for bed mobility, hoyer lift to power WC after CIR stay  ADL's / Homemaking Assistance Needed: Per CIR notes, pt was max A - dependent with ADLs at time of discharge in 05/2020  Comments: no family present     Hand Dominance  Extremity/Trunk Assessment   Upper Extremity Assessment Upper Extremity Assessment: Defer to OT evaluation    Lower Extremity Assessment Lower Extremity Assessment: RLE deficits/detail;LLE deficits/detail RLE Deficits / Details: no AROM or withdrawal to pain. PROM WFL LLE Deficits / Details: no AROM or withdrawal to pain. PROM WFL    Cervical / Trunk Assessment Cervical / Trunk Assessment: Other exceptions Cervical / Trunk Exceptions: head/neck flexed and rotated to the Rt   Communication   Communication: HOH  Cognition  Arousal/Alertness: Awake/alert Behavior During Therapy: Flat affect Overall Cognitive Status: No family/caregiver present to determine baseline cognitive functioning Area of Impairment: Memory                               General Comments: When asking about home routine, he often answered "I don't know"      General Comments General comments (skin integrity, edema, etc.): Attempted to teach pt to use soft-touch call button. His stronger arm is currently left with shoulder depression and extension. Placed soft-touch between arm and pillow however weight of his UE would accidentally trigger call system intermittently. He was able to actively initiate call, but number of "false/accidental calls" were too many to reliably use at this time.     Exercises     Assessment/Plan    PT Assessment All further PT needs can be met in the next venue of care  PT Problem List Decreased mobility;Decreased strength;Decreased activity tolerance;Decreased balance       PT Treatment Interventions      PT Goals (Current goals can be found in the Care Plan section)  Acute Rehab PT Goals Patient Stated Goal: get back home PT Goal Formulation: All assessment and education complete, DC therapy    Frequency     Barriers to discharge        Co-evaluation               AM-PAC PT "6 Clicks" Mobility  Outcome Measure Help needed turning from your back to your side while in a flat bed without using bedrails?: Total Help needed moving from lying on your back to sitting on the side of a flat bed without using bedrails?: Total Help needed moving to and from a bed to a chair (including a wheelchair)?: Total Help needed standing up from a chair using your arms (e.g., wheelchair or bedside chair)?: Total Help needed to walk in hospital room?: Total Help needed climbing 3-5 steps with a railing? : Total 6 Click Score: 6    End of Session   Activity Tolerance: Patient tolerated treatment  well Patient left: in bed Nurse Communication: Need for lift equipment;Other (comment) (soft-touch now in room; could not yet use) PT Visit Diagnosis: Other symptoms and signs involving the nervous system (D62.229)    Time: 7989-2119 PT Time Calculation (min) (ACUTE ONLY): 36 min   Charges:   PT Evaluation $PT Eval Moderate Complexity: 1 Mod PT Treatments $Therapeutic Activity: 8-22 mins         Arby Barrette, PT Pager [phone]   Rexanne Mano 09/02/2020, 7:05 PM

## 2020-09-03 ENCOUNTER — Inpatient Hospital Stay (HOSPITAL_COMMUNITY): Payer: Medicare Other

## 2020-09-03 DIAGNOSIS — Q8502 Neurofibromatosis, type 2: Secondary | ICD-10-CM | POA: Diagnosis not present

## 2020-09-03 DIAGNOSIS — R569 Unspecified convulsions: Secondary | ICD-10-CM | POA: Diagnosis not present

## 2020-09-03 DIAGNOSIS — M7989 Other specified soft tissue disorders: Secondary | ICD-10-CM | POA: Diagnosis not present

## 2020-09-03 DIAGNOSIS — J9601 Acute respiratory failure with hypoxia: Secondary | ICD-10-CM | POA: Diagnosis not present

## 2020-09-03 DIAGNOSIS — I6330 Cerebral infarction due to thrombosis of unspecified cerebral artery: Secondary | ICD-10-CM | POA: Diagnosis not present

## 2020-09-03 LAB — CBC
HCT: 26.2 % — ABNORMAL LOW (ref 39.0–52.0)
Hemoglobin: 8.2 g/dL — ABNORMAL LOW (ref 13.0–17.0)
MCH: 27.1 pg (ref 26.0–34.0)
MCHC: 31.3 g/dL (ref 30.0–36.0)
MCV: 86.5 fL (ref 80.0–100.0)
Platelets: 176 10*3/uL (ref 150–400)
RBC: 3.03 MIL/uL — ABNORMAL LOW (ref 4.22–5.81)
RDW: 19.3 % — ABNORMAL HIGH (ref 11.5–15.5)
WBC: 9.6 10*3/uL (ref 4.0–10.5)
nRBC: 0.3 % — ABNORMAL HIGH (ref 0.0–0.2)

## 2020-09-03 LAB — URINALYSIS, ROUTINE W REFLEX MICROSCOPIC
Bilirubin Urine: NEGATIVE
Glucose, UA: NEGATIVE mg/dL
Hgb urine dipstick: NEGATIVE
Ketones, ur: NEGATIVE mg/dL
Leukocytes,Ua: NEGATIVE
Nitrite: NEGATIVE
Protein, ur: NEGATIVE mg/dL
Specific Gravity, Urine: 1.013 (ref 1.005–1.030)
pH: 6 (ref 5.0–8.0)

## 2020-09-03 LAB — GLUCOSE, CAPILLARY
Glucose-Capillary: 132 mg/dL — ABNORMAL HIGH (ref 70–99)
Glucose-Capillary: 158 mg/dL — ABNORMAL HIGH (ref 70–99)
Glucose-Capillary: 150 mg/dL — ABNORMAL HIGH (ref 70–99)
Glucose-Capillary: 127 mg/dL — ABNORMAL HIGH (ref 70–99)
Glucose-Capillary: 143 mg/dL — ABNORMAL HIGH (ref 70–99)
Glucose-Capillary: 138 mg/dL — ABNORMAL HIGH (ref 70–99)
Glucose-Capillary: 107 mg/dL — ABNORMAL HIGH (ref 70–99)

## 2020-09-03 NOTE — Progress Notes (Signed)
Left upper extremity venous duplex has been completed. Preliminary results can be found in CV Proc through chart review.   09/03/20 11:25 AM Cameron Hale RVT

## 2020-09-03 NOTE — Progress Notes (Signed)
PROGRESS NOTE    Cameron Hale  KWI:[id number] DOB: 1976-07-26 DOA: 08/08/2020 PCP: Alroy Dust, L.Marlou Sa, MD   Brief Narrative: Cameron Hale is a 44 y.o. malewith medical history significant oftype 2 neurofibromatosis and seizures.Patient was noted to have a generalized seizure prior to admission, consistent in blank staringand beingunresponsive.Through the course of the day he returned to his baseline.  The patient subsequently became much more somnolent, lethargic, and unresponsive.  Patient subsequently transported to [facility] via EMS. On route he had persistent hypotension and he was brought to [facility] emergency department.  Family indicates patient had neurosurgery intervention in March 2021, post surgery he developed functional quadriplegia, blindness and hearing loss,the onlyfullneurologic function preserved is talking. He was discharged August 2021 home with his parents. He needs assistance with this basic activities of daily living including bathing and eating. He does have a decubitus pressure ulcer stage II. He was diagnosed with urinary tract infection about 7 days ago and he had ciprofloxacin prescribed.  In the ED patient noted to be unresponsive, his CT head with advanced meningiomatosis, unchanged vasogenic edema. No acute bleeding. Arterial blood gas analysis with normal pH. Chest radiograph with signs of aspiration pneumonia. [facility] was contacted, unfortunately unable to perform ED to ED transfer. Patient received intravenous fluids, dexamethasone, 2 g of Keppra,2 mg IV lorazepam, aztreonam,vancomycin, cefepime and metronidazole. Referred for admission forfurtherevaluation  9/25 worsening acute hypoxia, patient subsequently intubated,right chest tube x2 9/30 chest tubes were removed 10/4 extubated  10/5 weaned off oxygen, currently on RA 10/6 coretrak placed 10/7 weaned off levophed; mild hypothermia overnight - improving with bairhugger 10/8  stable, continue speech evaluation family requesting reevaluation and consideration for advancement of diet as they are hesitant to place PEG tube  Assessment & Plan:   Principal Problem:   Seizures ([place]) Active Problems:   Paraparesis ([place])   Mixed conductive and sensorineural hearing loss of right ear with unrestricted hearing of left ear   Quadriplegia ([place])   AMS (altered mental status)   Aspiration pneumonia ([place])   Neurofibromatosis 2 ([place])   Acute respiratory failure with hypoxia ([place])   Cerebral thrombosis with cerebral infarction   DNR (do not resuscitate) discussion   History of ETT   Palliative care by specialist   Acute respiratory failure with hypoxia Patient required mechanical ventilation in ICU and was extubated on 10/4. Currently weaned to room air. Resolved.  Pneumonitis Drug induced, secondary to previous monoclonal antibody neurofibromatosis treatment as an outpatient. Currently on steroids with plan for a slow taper over 6 weeks. Patient is also on P JP prophylaxis with Bactrim until prednisone dose is less than 20 mg daily. -Continue Solu-Medrol and Bactrim prophylaxis  Acute on chronic dysphagia Patient currently has Cortrak in place from 10/6 with tube feeds. Speech therapy on board with recommendations for continued alternate mode of nutrition. PEG tube is recommended and patient/mother have now agreed to placement. SLP reevaluated 9/14 and recommending to continue NPO with ice chips 4-5 timers per day. -Continue tube feeds -Follow-up IR recommendations fot PEG  Lethargy Resolved. Likely related to poor sleep.  Abdominal distension Suprapubic. Likely secondary to urinary retention. Requested bladder scan followed by post-void residual with output of 1400 mL. -Urine culture/urinalysis  Hypothermia Unsure if this was accurate but patient improved after a few hours on the [facility]. He has been normothermic since last night. Blood cultures obtained and  are pending.  Acute metabolic encephalopathy Back to baseline. EEG obtained without evidence of  seizures.  Acute ischemic infarcts Bilateral ACA strokes thought secondary to mass-effect of patient's meningiomatosis.  Right pleural effusion Thought to be transudate of from poor oncotic pressure. Currently resolved.  Hypernatremia Resolved  Hypokalemia Hypomagnesemia Resolved  Pre-diabetes Patient has had hypo- and hyperglycemic episodes. Hemoglobin A1C of 6.1%.  Labile blood pressure Patient started on midodrine. There is possible autonomic instability related to heavy neurofibromatosis burden on spinal cord -Continue midodrine  Neurofibromatosis, type 2 Progressive. Patient currently follows at [facility]. Patient is currently on brigatinib therapy which is currently on hold.  Seizure disorder -Continue Keppra  Acute on Chronic anemia Iron panel not very definitive. Mostly normocytic on history. Baseline of 8-9. Has been stable around 7-8 this admission with acute drop to 6.5 on 10/15. Unsure of etiology. 1 unit of PRBC given on 10/15. -Pathologist blood smear review ordered and is pending -FOBT pending  Severe protein calorie malnutrition Poor albumin. Currently on tube feeds as mentioned above. Dietitian is consulted. Speech therapy is consulted.  Left arm swelling Likely related to poor albumin. No tenderness or erythema. Unlikely related to acute DVT but needs to be ruled out. Looks improved today. -Elevate arm -Upper extremity venous duplex pending  Pressure injury Sacrum, POA.   DVT prophylaxis: Lovenox Code Status:   Code Status: Full Code Family Communication: None at bedside Disposition Plan: Plan for discharge home pending insertion of PEG tube and resumption of tube feeds with stability. Anticipate 3-5 days. Home with home health PT, OT, SLP, RN, home aide when ready.   Consultants:   PCCM  Palliative care  medicine  Procedures:     Antimicrobials:  Vanc 9/21 >> 9/24  Aztreonam 9/21  Flagyl 9/21 >> 9/25  Cefepime 9/21 >> 9/25  Ceftriaxone 9/26 >> 9/29  Bactrim 10/1 >> ongoing   Subjective: No issues today. Feels good. No abdominal pain.  Objective: Vitals:   09/02/20 2040 09/03/20 0057 09/03/20 0435 09/03/20 0830  BP: 136/90 (!) 153/100 138/90 140/86  Pulse: 80 84 80 90  Resp: 20 20 20 20   Temp:  98.1 F (36.7 C) 98.8 F (37.1 C) (!) 97.4 F (36.3 C)  TempSrc:  Oral Oral   SpO2: 95% 95% 97% 99%  Height:        Intake/Output Summary (Last 24 hours) at 09/03/2020 1122 Last data filed at 09/03/2020 1030 Gross per 24 hour  Intake 1916.17 ml  Output 1471 ml  Net 445.17 ml   Filed Weights    Examination:  General exam: Appears calm and comfortable Respiratory system: Clear to auscultation. Respiratory effort normal. Cardiovascular system: S1 & S2 heard, RRR. No murmurs, rubs, gallops or clicks. Gastrointestinal system: Abdomen is distended in the suprapubic area, soft and nontender. Distended bladder. Normal bowel sounds heard. Central nervous system: Alert and oriented. No focal neurological deficits. Musculoskeletal: No edema. No calf tenderness Skin: No cyanosis. No rashes Psychiatry: Judgement and insight appear normal. Mood & affect appropriate.   Data Reviewed: I have personally reviewed following labs and imaging studies  CBC Lab Results  Component Value Date   WBC 9.6 09/02/2020   RBC 2.95 (L) 09/02/2020   HGB 7.8 (L) 09/02/2020   HCT 25.3 (L) 09/02/2020   MCV 85.8 09/02/2020   MCH 26.4 09/02/2020   PLT 191 09/02/2020   MCHC 30.8 09/02/2020   RDW 19.1 (H) 09/02/2020   LYMPHSABS 0.8 08/08/2020   MONOABS 0.4 08/08/2020   EOSABS 0.0 08/08/2020   BASOSABS 0.0 08/08/2020  Last metabolic panel Lab Results  Component Value Date   NA 141 09/02/2020   K 4.5 09/02/2020   CL 107 09/02/2020   CO2 28 09/02/2020   BUN 31 (H) 09/02/2020    CREATININE 0.31 (L) 09/02/2020   GLUCOSE 143 (H) 09/02/2020   GFRNONAA >60 09/02/2020   GFRAA NOT CALCULATED 08/22/2020   CALCIUM 7.9 (L) 09/02/2020   PHOS 2.2 (L) 08/17/2020   PROT 4.7 (L) 08/22/2020   ALBUMIN 2.0 (L) 08/22/2020   BILITOT 0.4 08/22/2020   ALKPHOS 69 08/22/2020   AST 27 08/22/2020   ALT 91 (H) 08/22/2020   ANIONGAP 6 09/02/2020    CBG (last 3)  Recent Labs    09/03/20 0102 09/03/20 0433 09/03/20 0827  GLUCAP 132* 158* 150*     GFR: Estimated Creatinine Clearance: 119.4 mL/min (A) (by C-G formula based on SCr of 0.31 mg/dL (L)).  Coagulation Profile: No results for input(s): INR, PROTIME in the last 168 hours.  Recent Results (from the past 240 hour(s))  Culture, blood (routine x 2)     Status: None (Preliminary result)   Collection Time: 09/01/20  4:08 PM   Specimen: BLOOD LEFT HAND  Result Value Ref Range Status   Specimen Description BLOOD LEFT HAND  Final   Special Requests   Final    BOTTLES DRAWN AEROBIC AND ANAEROBIC Blood Culture adequate volume   Culture   Final    NO GROWTH 2 DAYS Performed at [facility], [address], [place], [place] [address]    Report Status PENDING  Incomplete  Culture, blood (routine x 2)     Status: None (Preliminary result)   Collection Time: 09/01/20  4:16 PM   Specimen: BLOOD  Result Value Ref Range Status   Specimen Description BLOOD LEFT ANTECUBITAL  Final   Special Requests   Final    BOTTLES DRAWN AEROBIC AND ANAEROBIC Blood Culture adequate volume   Culture   Final    NO GROWTH 2 DAYS Performed at [facility], [place] [address], [place], [place] [address]    Report Status PENDING  Incomplete        Radiology Studies: CT ABDOMEN WO CONTRAST  Result Date: 09/02/2020 CLINICAL DATA:  44 year old male with history of quadriplegia, type 2 neurofibromatosis, and seizures. Evaluate for possible gastrostomy tube placement. EXAM: CT ABDOMEN WITHOUT CONTRAST TECHNIQUE: Multidetector CT imaging  of the abdomen was performed following the standard protocol without IV contrast. COMPARISON:  None. FINDINGS: Lower chest: Small bilateral pleural effusions, right greater than left with associated bibasilar passive atelectasis. Streaky regions of hyperattenuation in the right lung base as could be seen with sequela of aspiration versus calcifications due to chronic atelectasis. Hepatobiliary: The left lobe of the liver is relatively atrophic. The liver is otherwise normal in size, contour, and attenuation. The gallbladder is present and unremarkable. No intra or extrahepatic biliary ductal dilation. Pancreas: Unremarkable. No pancreatic ductal dilatation or surrounding inflammatory changes. Spleen: Normal in size without focal abnormality. Adrenals/Urinary Tract: The adrenal glands are normal in size and morphology bilaterally. The kidneys are symmetric in size bilaterally. About the anterior and superior left capsule is irregular appearing focal calcification measuring approximately 6 mm in maximum diameter of indeterminate etiology, however favored represent sequela of prior trauma or instrumentation versus calcification of prior hemorrhagic cyst. The dome of the bladder is visualized partially. Stomach/Bowel: High-density material in the dependent portion of the stomach, favored represent enteric contrast administered the prior day during swallowing  study. Weighted tip enteric tube is in place with the tip terminating in the first portion of the duodenum. The visualized portion of the small bowel is normal in course and caliber. There is residual enteric contrast material within the visualize ascending transverse and descending colon which is normal in appearance. No significant bowel wall thickening or surrounding inflammatory changes. The transverse colon is in close proximity to the gastric body and antrum, however would likely separate with insufflation of the stomach. Vascular/Lymphatic: No significant  vascular findings are present. No enlarged abdominal or pelvic lymph nodes. Other: No abdominal wall hernia or abnormality. Musculoskeletal: Status post posterior spinal instrumented fusion throughout the visualized spine sparing the L2 and S1 vertebral bodies. No evidence of hardware complication. No acute osseous abnormality. IMPRESSION: 1. Percutaneous gastrostomy tube placement is likely favorable with adequate insufflation of the stomach. Recommend enteric contrast administration 24-48 hours prior to placement for opacification of the overlying transverse colon. 2. No acute abnormality in the abdomen. 3. Small bilateral pleural effusions, right greater than left. Ruthann Cancer, MD [facility] Electronically Signed   By: Ruthann Cancer MD   On: 09/02/2020 09:18        Scheduled Meds: . aspirin  81 mg Per Tube Daily  . chlorhexidine  15 mL Mouth Rinse BID  . Chlorhexidine Gluconate Cloth  6 each Topical Daily  . collagenase   Topical BID  . feeding supplement (PROSource TF)  45 mL Per Tube TID  . free water  100 mL Per Tube Q8H  . glycopyrrolate  0.1 mg Intravenous TID  . insulin aspart  0-9 Units Subcutaneous Q4H  . methylPREDNISolone (SOLU-MEDROL) injection  40 mg Intravenous Q12H  . midodrine  5 mg Per Tube BID WC  . pantoprazole sodium  40 mg Per Tube QHS  . sodium chloride flush  10-40 mL Intracatheter Q12H  . sulfamethoxazole-trimethoprim  1 tablet Per Tube Once per day on Mon Wed Fri   Continuous Infusions: . dextrose 5 % and 0.9% NaCl 50 mL/hr at 09/02/20 1343  . feeding supplement (OSMOLITE 1.5 CAL) 1,000 mL (09/02/20 1416)  . levETIRAcetam 1,500 mg (09/03/20 0910)     LOS: 26 days     Cordelia Poche, MD [facility] 09/03/2020, 11:22 AM  If 7PM-7AM, please contact night-coverage [email or web address]

## 2020-09-04 ENCOUNTER — Inpatient Hospital Stay (HOSPITAL_COMMUNITY): Payer: Medicare Other

## 2020-09-04 DIAGNOSIS — R569 Unspecified convulsions: Secondary | ICD-10-CM | POA: Diagnosis not present

## 2020-09-04 DIAGNOSIS — Q8502 Neurofibromatosis, type 2: Secondary | ICD-10-CM | POA: Diagnosis not present

## 2020-09-04 DIAGNOSIS — I6330 Cerebral infarction due to thrombosis of unspecified cerebral artery: Secondary | ICD-10-CM | POA: Diagnosis not present

## 2020-09-04 DIAGNOSIS — J9601 Acute respiratory failure with hypoxia: Secondary | ICD-10-CM | POA: Diagnosis not present

## 2020-09-04 LAB — COMPREHENSIVE METABOLIC PANEL
ALT: 52 U/L — ABNORMAL HIGH (ref 0–44)
AST: 20 U/L (ref 15–41)
Albumin: 1.7 g/dL — ABNORMAL LOW (ref 3.5–5.0)
Alkaline Phosphatase: 71 U/L (ref 38–126)
Anion gap: 7 (ref 5–15)
BUN: 28 mg/dL — ABNORMAL HIGH (ref 6–20)
CO2: 26 mmol/L (ref 22–32)
Calcium: 7.9 mg/dL — ABNORMAL LOW (ref 8.9–10.3)
Chloride: 106 mmol/L (ref 98–111)
Creatinine, Ser: <0.3 mg/dL — ABNORMAL LOW (ref 0.61–1.24)
Glucose, Bld: 151 mg/dL — ABNORMAL HIGH (ref 70–99)
Potassium: 4 mmol/L (ref 3.5–5.1)
Sodium: 139 mmol/L (ref 135–145)
Total Bilirubin: 0.3 mg/dL (ref 0.3–1.2)
Total Protein: 4.1 g/dL — ABNORMAL LOW (ref 6.5–8.1)

## 2020-09-04 LAB — GLUCOSE, CAPILLARY
Glucose-Capillary: 139 mg/dL — ABNORMAL HIGH (ref 70–99)
Glucose-Capillary: 132 mg/dL — ABNORMAL HIGH (ref 70–99)
Glucose-Capillary: 133 mg/dL — ABNORMAL HIGH (ref 70–99)
Glucose-Capillary: 140 mg/dL — ABNORMAL HIGH (ref 70–99)
Glucose-Capillary: 129 mg/dL — ABNORMAL HIGH (ref 70–99)
Glucose-Capillary: 154 mg/dL — ABNORMAL HIGH (ref 70–99)
Glucose-Capillary: 147 mg/dL — ABNORMAL HIGH (ref 70–99)

## 2020-09-04 LAB — URINE CULTURE: Culture: 30000 — AB

## 2020-09-04 LAB — PROTIME-INR
INR: 0.9 (ref 0.8–1.2)
Prothrombin Time: 12 seconds (ref 11.4–15.2)

## 2020-09-04 LAB — OCCULT BLOOD X 1 CARD TO LAB, STOOL: Fecal Occult Bld: POSITIVE — AB

## 2020-09-04 MED ORDER — PREDNISONE 20 MG PO TABS: 40.0000 mg | ORAL_TABLET | Freq: Every day | ORAL | Status: DC

## 2020-09-04 MED ORDER — PREDNISONE 20 MG PO TABS
40.0000 mg | ORAL_TABLET | Freq: Every day | ORAL | Status: DC
  Administered 2020-09-04 – 2020-09-08 (×4): 40 mg
  Filled 2020-09-04 (×4): qty 2

## 2020-09-04 MED ORDER — VANCOMYCIN HCL IN DEXTROSE 1-5 GM/200ML-% IV SOLN
1000.0000 mg | INTRAVENOUS | Status: AC
  Administered 2020-09-05: 1000 mg via INTRAVENOUS
  Filled 2020-09-04 (×2): qty 200

## 2020-09-04 MED ORDER — PANTOPRAZOLE SODIUM 40 MG IV SOLR
40.0000 mg | Freq: Two times a day (BID) | INTRAVENOUS | Status: DC
  Administered 2020-09-04 – 2020-09-07 (×6): 40 mg via INTRAVENOUS
  Filled 2020-09-04 (×6): qty 40

## 2020-09-04 MED ORDER — PREDNISONE 20 MG PO TABS
40.0000 mg | ORAL_TABLET | Freq: Every day | ORAL | Status: DC
  Filled 2020-09-04: qty 2

## 2020-09-04 NOTE — Progress Notes (Addendum)
PROGRESS NOTE    DORN HARTSHORNE  NTI:[id number] DOB: October 31, 1976 DOA: 08/08/2020 PCP: Alroy Dust, L.Marlou Sa, MD   Brief Narrative: Cameron Hale is a 44 y.o. malewith medical history significant oftype 2 neurofibromatosis and seizures.Patient was noted to have a generalized seizure prior to admission, consistent in blank staringand beingunresponsive.Through the course of the day he returned to his baseline.  The patient subsequently became much more somnolent, lethargic, and unresponsive.  Patient subsequently transported to [facility] via EMS. On route he had persistent hypotension and he was brought to [facility] emergency department.  Family indicates patient had neurosurgery intervention in March 2021, post surgery he developed functional quadriplegia, blindness and hearing loss,the onlyfullneurologic function preserved is talking. He was discharged August 2021 home with his parents. He needs assistance with this basic activities of daily living including bathing and eating. He does have a decubitus pressure ulcer stage II. He was diagnosed with urinary tract infection about 7 days ago and he had ciprofloxacin prescribed.  In the ED patient noted to be unresponsive, his CT head with advanced meningiomatosis, unchanged vasogenic edema. No acute bleeding. Arterial blood gas analysis with normal pH. Chest radiograph with signs of aspiration pneumonia. [facility] was contacted, unfortunately unable to perform ED to ED transfer. Patient received intravenous fluids, dexamethasone, 2 g of Keppra,2 mg IV lorazepam, aztreonam,vancomycin, cefepime and metronidazole. Referred for admission forfurtherevaluation  9/25 worsening acute hypoxia, patient subsequently intubated,right chest tube x2 9/30 chest tubes were removed 10/4 extubated  10/5 weaned off oxygen, currently on RA 10/6 coretrak placed 10/7 weaned off levophed; mild hypothermia overnight - improving with bairhugger 10/8  stable, continue speech evaluation family requesting reevaluation and consideration for advancement of diet as they are hesitant to place PEG tube  Assessment & Plan:   Principal Problem:   Seizures ([place]) Active Problems:   Paraparesis ([place])   Mixed conductive and sensorineural hearing loss of right ear with unrestricted hearing of left ear   Quadriplegia ([place])   AMS (altered mental status)   Aspiration pneumonia ([place])   Neurofibromatosis 2 ([place])   Acute respiratory failure with hypoxia ([place])   Cerebral thrombosis with cerebral infarction   DNR (do not resuscitate) discussion   History of ETT   Palliative care by specialist   Acute respiratory failure with hypoxia Patient required mechanical ventilation in ICU and was extubated on 10/4. Currently weaned to room air. Resolved.  Pneumonitis Drug induced, secondary to previous monoclonal antibody neurofibromatosis treatment as an outpatient. Currently on steroids with plan for a slow taper over 6 weeks. Patient is also on PJP prophylaxis with Bactrim until prednisone dose is less than 20 mg daily. -Switch to Prednisone 40 mg daily and Bactrim prophylaxis  Acute on chronic dysphagia Patient currently has Cortrak in place from 10/6 with tube feeds. Speech therapy on board with recommendations for continued alternate mode of nutrition. PEG tube is recommended and patient/mother have now agreed to placement. SLP reevaluated 9/14 and recommending to continue NPO with ice chips 4-5 timers per day. -Continue tube feeds -Follow-up IR recommendations fot PEG  Lethargy Resolved. Likely related to poor sleep.  Acute urinary retention Suprapubic. Likely secondary to urinary retention. Requested bladder scan followed by post-void residual with output of 1400 mL. Patient now with possible post-obstructive diuresis. Urinalysis not suggestive of infection. Mother states patient was referred for urology follow-up for recurrent UTIs -Urine culture  pending -Continue IV fluids in setting of significant diuresis  Hypothermia Unsure if this was accurate but  patient improved after a few hours on the [facility]. He has been normothermic since last night. Blood cultures obtained and are no growth to date. Resolved.  Acute metabolic encephalopathy Back to baseline. EEG obtained without evidence of seizures.  Acute ischemic infarcts Bilateral ACA strokes thought secondary to mass-effect of patient's meningiomatosis.  Right pleural effusion Thought to be transudate of from poor oncotic pressure. Currently resolved.  Hypernatremia Resolved  Hypokalemia Hypomagnesemia Resolved  Pre-diabetes Patient has had hypo- and hyperglycemic episodes. Hemoglobin A1C of 6.1%.  Labile blood pressure Patient started on midodrine. There is possible autonomic instability related to heavy neurofibromatosis burden on spinal cord. Blood pressure improved and he has some supine elevated blood pressure -Discontinue midodrine for now  Neurofibromatosis, type 2 Progressive. Patient currently follows [facility]. Patient is currently on brigatinib therapy which is currently on hold.  Seizure disorder -Continue Keppra  Acute on Chronic anemia Iron panel not very definitive. Mostly normocytic on history. Baseline of 8-9. Has been stable around 7-8 this admission with acute drop to 6.5 on 10/15. Unsure of etiology. 1 unit of PRBC given on 10/15. FOBT positive. Blood smear review significant for normocytic anemia. -Consult [facility]  Severe protein calorie malnutrition Poor albumin. Currently on tube feeds as mentioned above. Dietitian is consulted. Speech therapy is consulted.  Left arm swelling Likely related to poor albumin. No tenderness or erythema. Unlikely related to acute DVT but needs to be ruled out. Looks improved. Upper extremity venous duplex negative for DVT -Elevate arm  Stroke Earlier this admission. Bilateral  frontal infarcts predominantly and the ACA territory bilaterally.  Small superior vermian acute infarct. Diagnosed on 9/26 by neurology and placed on aspirin 81 mg daily.  Pressure injury Sacrum, POA.   DVT prophylaxis: Lovenox Code Status:   Code Status: Full Code Family Communication: Mother at bedside Disposition Plan: Plan for discharge home pending insertion of PEG tube and resumption of tube feeds with stability. Anticipate 3-5 days. Home with home health PT, OT, SLP, RN, home aide when ready.   Consultants:   PCCM  Palliative care medicine  Procedures:     Antimicrobials:  Vanc 9/21 >> 9/24  Aztreonam 9/21  Flagyl 9/21 >> 9/25  Cefepime 9/21 >> 9/25  Ceftriaxone 9/26 >> 9/29  Bactrim 10/1 >> ongoing   Subjective: No concerns today. No paraesthesias.  Objective: Vitals:   09/03/20 2002 09/03/20 2255 09/04/20 0336 09/04/20 0741  BP: 129/88 124/89 (!) 136/95 (!) 145/94  Pulse: 84 87 83 72  Resp: 16 17 19 18   Temp: 98.5 F (36.9 C) 97.9 F (36.6 C) 98.2 F (36.8 C) (!) 97.4 F (36.3 C)  TempSrc: Oral Oral Oral Oral  SpO2: 96% 97% 98% 98%  Height:        Intake/Output Summary (Last 24 hours) at 09/04/2020 0942 Last data filed at 09/04/2020 0600 Gross per 24 hour  Intake 3940.13 ml  Output 4471 ml  Net -530.87 ml   Filed Weights    Examination:  General exam: Appears calm and comfortable Respiratory system: Clear to auscultation. Respiratory effort normal. Cardiovascular system: S1 & S2 heard, RRR. No murmurs, rubs, gallops or clicks. Gastrointestinal system: Abdomen is nondistended, soft and nontender. No organomegaly or masses felt. Normal bowel sounds heard. Central nervous system: Alert and oriented. No focal neurological deficits. Musculoskeletal: No edema. No calf tenderness. Some contractures of upper extremity Skin: No cyanosis. No rashes Psychiatry: Judgement and insight appear normal. Mood & affect appropriate.  Data Reviewed: I  have personally reviewed following labs and imaging studies  CBC Lab Results  Component Value Date   WBC 9.6 09/03/2020   RBC 3.03 (L) 09/03/2020   HGB 8.2 (L) 09/03/2020   HCT 26.2 (L) 09/03/2020   MCV 86.5 09/03/2020   MCH 27.1 09/03/2020   PLT 176 09/03/2020   MCHC 31.3 09/03/2020   RDW 19.3 (H) 09/03/2020   LYMPHSABS 0.8 08/08/2020   MONOABS 0.4 08/08/2020   EOSABS 0.0 08/08/2020   BASOSABS 0.0 13/06/6577     Last metabolic panel Lab Results  Component Value Date   NA 141 09/02/2020   K 4.5 09/02/2020   CL 107 09/02/2020   CO2 28 09/02/2020   BUN 31 (H) 09/02/2020   CREATININE 0.31 (L) 09/02/2020   GLUCOSE 143 (H) 09/02/2020   GFRNONAA >60 09/02/2020   GFRAA NOT CALCULATED 08/22/2020   CALCIUM 7.9 (L) 09/02/2020   PHOS 2.2 (L) 08/17/2020   PROT 4.7 (L) 08/22/2020   ALBUMIN 2.0 (L) 08/22/2020   BILITOT 0.4 08/22/2020   ALKPHOS 69 08/22/2020   AST 27 08/22/2020   ALT 91 (H) 08/22/2020   ANIONGAP 6 09/02/2020    CBG (last 3)  Recent Labs    09/04/20 0340 09/04/20 0701 09/04/20 0747  GLUCAP 139* 132* 133*     GFR: Estimated Creatinine Clearance: 119.4 mL/min (A) (by C-G formula based on SCr of 0.31 mg/dL (L)).  Coagulation Profile: No results for input(s): INR, PROTIME in the last 168 hours.  Recent Results (from the past 240 hour(s))  Culture, blood (routine x 2)     Status: None (Preliminary result)   Collection Time: 09/01/20  4:08 PM   Specimen: BLOOD LEFT HAND  Result Value Ref Range Status   Specimen Description BLOOD LEFT HAND  Final   Special Requests   Final    BOTTLES DRAWN AEROBIC AND ANAEROBIC Blood Culture adequate volume   Culture   Final    NO GROWTH 3 DAYS Performed at [facility], [address] [place], [place] [address]    Report Status PENDING  Incomplete  Culture, blood (routine x 2)     Status: None (Preliminary result)   Collection Time: 09/01/20  4:16 PM   Specimen: BLOOD  Result Value Ref Range Status    Specimen Description BLOOD LEFT ANTECUBITAL  Final   Special Requests   Final    BOTTLES DRAWN AEROBIC AND ANAEROBIC Blood Culture adequate volume   Culture   Final    NO GROWTH 3 DAYS Performed at [facility], [place] [address], [place], [place] [address]    Report Status PENDING  Incomplete  Culture, Urine     Status: None (Preliminary result)   Collection Time: 09/03/20 10:54 AM   Specimen: Urine, Random  Result Value Ref Range Status   Specimen Description URINE, RANDOM  Final   Special Requests NONE  Final   Culture   Final    CULTURE REINCUBATED FOR BETTER GROWTH Performed at [facility], [place] [address], [place],  [address]    Report Status PENDING  Incomplete        Radiology Studies: DG Abd Portable 1V  Result Date: 09/03/2020 CLINICAL DATA:  Constipation. EXAM: PORTABLE ABDOMEN - 1 VIEW COMPARISON:  CT abdomen 09/01/2020. Abdominal radiograph 08/14/2020. FINDINGS: A feeding tube remains in place with tip in the region of the distal stomach or first portion of the duodenum. Gas is present throughout nondilated colon  without evidence of an increased stool burden. Gas is also present in scattered nondilated loops of small bowel. There is no evidence of bowel obstruction. Extensive thoracolumbar spinal fusion is noted. IMPRESSION: Nonobstructed bowel gas pattern. Electronically Signed   By: Logan Bores M.D.   On: 09/03/2020 14:44   VAS [place] UPPER EXTREMITY VENOUS DUPLEX  Result Date: 09/03/2020 UPPER VENOUS STUDY  Indications: Swelling Risk Factors: None identified. Comparison Study: No prior studies. Performing Technologist: Oliver Hum RVT  Examination Guidelines: A complete evaluation includes B-mode imaging, spectral Doppler, color Doppler, and power Doppler as needed of all accessible portions of each vessel. Bilateral testing is considered an integral part of a complete examination. Limited examinations for reoccurring indications may be performed as  noted.  Right Findings: +----------+------------+---------+-----------+----------+-------+ RIGHT     CompressiblePhasicitySpontaneousPropertiesSummary +----------+------------+---------+-----------+----------+-------+ Subclavian    Full       Yes       Yes                      +----------+------------+---------+-----------+----------+-------+  Left Findings: +----------+------------+---------+-----------+----------+-------+ LEFT      CompressiblePhasicitySpontaneousPropertiesSummary +----------+------------+---------+-----------+----------+-------+ IJV           Full       Yes       Yes                      +----------+------------+---------+-----------+----------+-------+ Subclavian    Full       Yes       Yes                      +----------+------------+---------+-----------+----------+-------+ Axillary      Full       Yes       Yes                      +----------+------------+---------+-----------+----------+-------+ Brachial      Full       Yes       Yes                      +----------+------------+---------+-----------+----------+-------+ Radial        Full                                          +----------+------------+---------+-----------+----------+-------+ Ulnar         Full                                          +----------+------------+---------+-----------+----------+-------+ Cephalic      Full                                          +----------+------------+---------+-----------+----------+-------+ Basilic       Full                                          +----------+------------+---------+-----------+----------+-------+  Summary:  Right: No evidence of thrombosis in the subclavian.  Left: No evidence of deep vein thrombosis in the upper extremity. No evidence of superficial vein thrombosis in  the upper extremity.  *See table(s) above for measurements and observations.  Diagnosing physician: Monica Martinez MD  Electronically signed by Monica Martinez MD on 09/03/2020 at 3:00:15 PM.    Final         Scheduled Meds: . aspirin  81 mg Per Tube Daily  . chlorhexidine  15 mL Mouth Rinse BID  . Chlorhexidine Gluconate Cloth  6 each Topical Daily  . collagenase   Topical BID  . feeding supplement (PROSource TF)  45 mL Per Tube TID  . free water  100 mL Per Tube Q8H  . glycopyrrolate  0.1 mg Intravenous TID  . insulin aspart  0-9 Units Subcutaneous Q4H  . pantoprazole sodium  40 mg Per Tube QHS  . predniSONE  40 mg Oral Q breakfast  . sodium chloride flush  10-40 mL Intracatheter Q12H  . sulfamethoxazole-trimethoprim  1 tablet Per Tube Once per day on Mon Wed Fri   Continuous Infusions: . dextrose 5 % and 0.9% NaCl 50 mL/hr at 09/04/20 0351  . feeding supplement (OSMOLITE 1.5 CAL) 1,000 mL (09/03/20 2148)  . levETIRAcetam 1,500 mg (09/04/20 0903)     LOS: 27 days     Cordelia Poche, MD [facility] 09/04/2020, 9:42 AM  If 7PM-7AM, please contact night-coverage [email or web address]

## 2020-09-04 NOTE — Progress Notes (Signed)
GI PA at bedside noted patient extremely difficult to arouse sternal rub and painful stimuli attempted. Noted to be twitching. Full set of vital signs WNL. CBG 129. Episode lasted approx 5-10 minutes. Patient aroused A&O x 4 states he did not realize he was asleep. Paged Dr. Lonny Prude.   Dr. Lonny Prude at bedside to assess patient. See new orders.

## 2020-09-04 NOTE — Progress Notes (Signed)
°  Speech Language Pathology Treatment: Dysphagia  Patient Details Name: Cameron Hale [name]: [id number] DOB: [birth year] Today's Date: 09/04/2020 Time: 9675-9163 SLP Time Calculation (min) (ACUTE ONLY): 20 min  Assessment / Plan / Recommendation Clinical Impression  Pt completed swallowing exercises with Mod cues from SLP, with additional physical assist also needed to seal his lips around his EMST device. Today he was able to complete three sets of five repetitions, which was a full set more than he completed during initial presentation. He self-reported effort at a 1/10 after the second set, but he also seemed to need a lot more effort throughout the third set, so we stopped there for now. He and his mother were educated throughout session on current exercises and POC, also reinforcing education from previous session. Additional reinforcement will be beneficial. Will continue to follow acutely.   HPI HPI: Cameron Hale is a 45 y.o. male with history of neurofibromatosis type II with extensive cervical neuromas-underwent surgery March 2021 and unfortunately complicated by postoperative infection and quadriplegia, seizures on keppra, blind OS w/ impaired eye movements, presenting to [facility] 9/22 with altered mental status. Developed hypoglycemia, hypotension and increased WOB. Presents with acute hypoxic respiratory failure due to acute encephalopathy, acute strokes, chronic weakness from quadriplegia and neurofibromatosis, and acute medication induced pneumonitis. Pt intubated 9/25-10/4.  FEES 10/5-  severe, diffuse weakness across oral and pharyngeal musculature leading to delayed oral transit, spillage to pyriforms prior to the swallow and then severe residue after the swallow. Pt has bilateral errythema and indentations on posterior portion of the vocal folds. There is gradual silent aspiration of residue via the posterior commissure.       SLP Plan  Continue with current plan of  care       Recommendations  Diet recommendations: NPO;Other(comment) (4-5 ice chips 3x a day after oral care) Medication Administration: Via alternative means                Oral Care Recommendations: Oral care QID Follow up Recommendations: Home health SLP;24 hour supervision/assistance SLP Visit Diagnosis: Dysphagia, oropharyngeal phase (R13.12) Plan: Continue with current plan of care       GO                [name], M.A. [facility] Pager [phone] Office [phone]  09/04/2020, 12:40 PM

## 2020-09-04 NOTE — Progress Notes (Signed)
Received page that patient is obtunded. Went to bedside and patient was back to baseline. Patient unaware of unresponsive episode. No evidence of post-ictal state. Does not appear patient had an aspiration event.   General exam: Appears calm and comfortable Respiratory system: Clear to auscultation. Respiratory effort normal. Cardiovascular system: S1 & S2 heard, RRR. No murmurs, rubs, gallops or clicks. Gastrointestinal system: Abdomen is nondistended, soft and nontender. No organomegaly or masses felt. Normal bowel sounds heard. Central nervous system: Sleeping but easily arouses. Baseline neurologic exam Musculoskeletal: No edema. No calf tenderness Skin: No cyanosis. No rashes Psychiatry: Judgement and insight appear normal. Mood & affect appropriate.   A/P: -Place on telemetry -Neuro checks -Chest x-ray  Cordelia Poche, MD [facility] 09/04/2020, 3:03 PM

## 2020-09-04 NOTE — Progress Notes (Signed)
Per nursing order administered 1 cup of thin barium swallow per tube.   Also received clarification on STAT CBC order to be drawn on 09/05/20.

## 2020-09-04 NOTE — Consult Note (Addendum)
Referring Provider: Dr. Cordelia Poche Primary Care Physician:  Alroy Dust, Carlean Jews.Marlou Sa, MD Primary Gastroenterologist:  Cory Roughen PCP)  Reason for Consultation:  Anemia, heme-positive stools  HPI: Cameron Hale is a 44 y.o. male with past medical history of seizures, recent CVA, and type 2 neurofibromatosis s/p neurosurgery in 01/2020, and quadriplegia presenting for consultation of anemia and heme-positive stools.  Patient has been hospitalized since 08/08/2020.  Hemoglobin on admission was 9.5, decreased from baseline of 11.7 as of 05/2020.  Since admission, he has developed progressive anemia.  Over the last week or so, his hemoglobin has remained in the 7-7.5 range.  However, on 09/01/2020, Hemoglobin decreased to 6.5.  He was given 1u pRBCs with rise in Hgb to 8.0.  Hemoglobin has since remained stable, 8.2 as of 10/17.  Patient obtunded and no subjective data able to be obtained.  RNs made aware of patient's change in mental status, as he was alert and oriented earlier today per report.  Called patient's mother [name]) to discuss.  She states patient has not been on any NSAIDs, ASA, or blood thinners at home. No prior episodes of melena or hematochezia.  No family history of colon cancer or gastrointestinal malignancy.  Past Medical History:  Diagnosis Date  . Hard of hearing   . NF2 (neurofibromatosis 2) ([place])   . Pressure ulcer   . Quadriplegia ([place])   . Seizures ([place])   . Vestibular schwannoma [facility])     Past Surgical History:  Procedure Laterality Date  . brain bleed     mri 2009  . HAND EXPLORATION    . LUMBAR FUSION    . NEPHRECTOMY     Partial left nephrectomy   . SPINAL FUSION      Prior to Admission medications   Medication Sig Start Date End Date Taking? Authorizing Provider  acetaminophen (TYLENOL) 325 MG tablet Take 2 tablets (650 mg total) by mouth every 6 (six) hours as needed for mild pain or fever. 06/05/20  Yes Angiulli, Lavon Paganini, PA-C  baclofen  (LIORESAL) 10 MG tablet Take 1 tablet (10 mg total) by mouth 2 (two) times daily. 06/14/20  Yes Lovorn, Megan, MD  brigatinib (ALUNBRIG) 90 & 180 MG TBPK Take 90-180 mg by mouth daily.  06/07/20  Yes [provider]  carboxymethylcellulose (REFRESH TEARS) 0.5 % SOLN Place 1 drop into both eyes daily as needed (For dry eyes).   Yes [provider]  ciprofloxacin (CIPRO) 500 MG tablet Take 500 mg by mouth 2 (two) times daily.  08/01/20  Yes [provider]  dexamethasone (DECADRON) 2 MG tablet Take 2 mg by mouth daily.  06/21/20  Yes [provider]  famotidine (PEPCID) 20 MG tablet Take 1 tablet (20 mg total) by mouth 2 (two) times daily. 07/21/20  Yes Lovorn, Jinny Blossom, MD  levETIRAcetam (KEPPRA) 500 MG tablet Take 3 tablets (1,500 mg total) by mouth 2 (two) times daily. 06/05/20 08/08/20 Yes Angiulli, Lavon Paganini, PA-C  propranolol (INDERAL) 10 MG tablet Take 1 tablet (10 mg total) by mouth 2 (two) times daily. 06/05/20  Yes Angiulli, Lavon Paganini, PA-C  ammonium lactate (AMLACTIN) 12 % lotion Apply 1 application topically as needed for dry skin. Patient not taking: Reported on 08/08/2020 06/28/20   Felipa Furnace, DPM  bisacodyl (DULCOLAX) 10 MG suppository Place 1 suppository (10 mg total) rectally daily at 6 PM. Patient not taking: Reported on 08/08/2020 06/05/20   Cathlyn Parsons, PA-C    Scheduled Meds: . aspirin  81 mg Per Tube Daily  . chlorhexidine  15 mL Mouth Rinse BID  . Chlorhexidine Gluconate Cloth  6 each Topical Daily  . collagenase   Topical BID  . feeding supplement (PROSource TF)  45 mL Per Tube TID  . free water  100 mL Per Tube Q8H  . glycopyrrolate  0.1 mg Intravenous TID  . insulin aspart  0-9 Units Subcutaneous Q4H  . pantoprazole sodium  40 mg Per Tube QHS  . predniSONE  40 mg Per Tube Q breakfast  . sodium chloride flush  10-40 mL Intracatheter Q12H  . sulfamethoxazole-trimethoprim  1 tablet Per Tube Once per day on Mon Wed Fri   Continuous  Infusions: . dextrose 5 % and 0.9% NaCl 50 mL/hr at 09/04/20 0351  . feeding supplement (OSMOLITE 1.5 CAL) 1,000 mL (09/03/20 2148)  . levETIRAcetam 1,500 mg (09/04/20 0903)  . [START ON 09/05/2020] vancomycin     PRN Meds:.albuterol, artificial tears, hydrALAZINE, ondansetron **OR** ondansetron (ZOFRAN) IV, sodium chloride flush  Allergies as of 08/08/2020 - Review Complete 08/08/2020  Allergen Reaction Noted  . Amoxicillin Itching 03/22/2019  . Morphine Itching 05/01/2016    No family history on file.  Social History   Socioeconomic History  . Marital status: Married    Spouse name: Not on file  . Number of children: Not on file  . Years of education: Not on file  . Highest education level: Not on file  Occupational History  . Not on file  Tobacco Use  . Smoking status: Never Smoker  . Smokeless tobacco: Never Used  Vaping Use  . Vaping Use: Never used  Substance and Sexual Activity  . Alcohol use: Not Currently    Alcohol/week: 0.0 standard drinks  . Drug use: Never  . Sexual activity: Not Currently  Other Topics Concern  . Not on file  Social History Narrative  . Not on file   Social Determinants of Health   Financial Resource Strain:   . Difficulty of Paying Living Expenses: Not on file  Food Insecurity:   . Worried About Charity fundraiser in the Last Year: Not on file  . Ran Out of Food in the Last Year: Not on file  Transportation Needs:   . Lack of Transportation (Medical): Not on file  . Lack of Transportation (Non-Medical): Not on file  Physical Activity:   . Days of Exercise per Week: Not on file  . Minutes of Exercise per Session: Not on file  Stress:   . Feeling of Stress : Not on file  Social Connections:   . Frequency of Communication with Friends and Family: Not on file  . Frequency of Social Gatherings with Friends and Family: Not on file  . Attends Religious Services: Not on file  . Active Member of Clubs or Organizations: Not on file   . Attends Archivist Meetings: Not on file  . Marital Status: Not on file  Intimate Partner Violence:   . Fear of Current or Ex-Partner: Not on file  . Emotionally Abused: Not on file  . Physically Abused: Not on file  . Sexually Abused: Not on file    Review of Systems: Unable to obtain due to patient's mental status.  Physical Exam: Vital signs: Vitals:   09/04/20 1216 09/04/20 1422  BP: (!) 138/92 (!) 133/92  Pulse: 72 75  Resp: 20 18  Temp: 97.7 F (36.5 C) 97.6 F (36.4 C)  SpO2: 99% 99%   Last BM Date:  09/01/20  Physical Exam Vitals reviewed.  Constitutional:      General: He is not in acute distress. HENT:     Head: Normocephalic and atraumatic.     Nose: Nose normal.     Mouth/Throat:     Mouth: Mucous membranes are moist.     Pharynx: Oropharynx is clear.  Eyes:     General: No scleral icterus.    Conjunctiva/sclera: Conjunctivae normal.  Cardiovascular:     Rate and Rhythm: Normal rate and regular rhythm.     Pulses: Normal pulses.     Heart sounds: Normal heart sounds.  Pulmonary:     Effort: Pulmonary effort is normal. No respiratory distress.     Breath sounds: Normal breath sounds.  Abdominal:     General: Bowel sounds are normal. There is no distension.     Palpations: Abdomen is soft. There is no mass.     Tenderness: There is no abdominal tenderness. There is no guarding or rebound.     Hernia: No hernia is present.  Musculoskeletal:        General: No swelling or tenderness.     Cervical back: Normal range of motion and neck supple.  Skin:    General: Skin is warm and dry.  Neurological:     Mental Status: He is unresponsive.      GI:  Lab Results: Recent Labs    09/01/20 2036 09/02/20 0500 09/03/20 1310  WBC  --  9.6 9.6  HGB 8.0* 7.8* 8.2*  HCT 25.6* 25.3* 26.2*  PLT  --  191 176   BMET Recent Labs    09/02/20 0500  NA 141  K 4.5  CL 107  CO2 28  GLUCOSE 143*  BUN 31*  CREATININE 0.31*  CALCIUM 7.9*    LFT No results for input(s): PROT, ALBUMIN, AST, ALT, ALKPHOS, BILITOT, BILIDIR, IBILI in the last 72 hours. PT/INR No results for input(s): LABPROT, INR in the last 72 hours.   Studies/Results: DG Abd Portable 1V  Result Date: 09/03/2020 CLINICAL DATA:  Constipation. EXAM: PORTABLE ABDOMEN - 1 VIEW COMPARISON:  CT abdomen 09/01/2020. Abdominal radiograph 08/14/2020. FINDINGS: A feeding tube remains in place with tip in the region of the distal stomach or first portion of the duodenum. Gas is present throughout nondilated colon without evidence of an increased stool burden. Gas is also present in scattered nondilated loops of small bowel. There is no evidence of bowel obstruction. Extensive thoracolumbar spinal fusion is noted. IMPRESSION: Nonobstructed bowel gas pattern. Electronically Signed   By: Logan Bores M.D.   On: 09/03/2020 14:44   VAS [place] UPPER EXTREMITY VENOUS DUPLEX  Result Date: 09/03/2020 UPPER VENOUS STUDY  Indications: Swelling Risk Factors: None identified. Comparison Study: No prior studies. Performing Technologist: Oliver Hum RVT  Examination Guidelines: A complete evaluation includes B-mode imaging, spectral Doppler, color Doppler, and power Doppler as needed of all accessible portions of each vessel. Bilateral testing is considered an integral part of a complete examination. Limited examinations for reoccurring indications may be performed as noted.  Right Findings: +----------+------------+---------+-----------+----------+-------+ RIGHT     CompressiblePhasicitySpontaneousPropertiesSummary +----------+------------+---------+-----------+----------+-------+ Subclavian    Full       Yes       Yes                      +----------+------------+---------+-----------+----------+-------+  Left Findings: +----------+------------+---------+-----------+----------+-------+ LEFT      CompressiblePhasicitySpontaneousPropertiesSummary  +----------+------------+---------+-----------+----------+-------+ IJV  Full       Yes       Yes                      +----------+------------+---------+-----------+----------+-------+ Subclavian    Full       Yes       Yes                      +----------+------------+---------+-----------+----------+-------+ Axillary      Full       Yes       Yes                      +----------+------------+---------+-----------+----------+-------+ Brachial      Full       Yes       Yes                      +----------+------------+---------+-----------+----------+-------+ Radial        Full                                          +----------+------------+---------+-----------+----------+-------+ Ulnar         Full                                          +----------+------------+---------+-----------+----------+-------+ Cephalic      Full                                          +----------+------------+---------+-----------+----------+-------+ Basilic       Full                                          +----------+------------+---------+-----------+----------+-------+  Summary:  Right: No evidence of thrombosis in the subclavian.  Left: No evidence of deep vein thrombosis in the upper extremity. No evidence of superficial vein thrombosis in the upper extremity.  *See table(s) above for measurements and observations.  Diagnosing physician: Monica Martinez MD Electronically signed by Monica Martinez MD on 09/03/2020 at 3:00:15 PM.    Final     Impression: Anemia and heme-positive stools: -Hgb 8.2, improved from 6.5 after 1u PRBCs on 09/01/20.  Hemoglobin on admission was 9.5, decreased from baseline of 11.7 as of 05/2020.   -FOBT positive stool 09/04/20, no frank melena or hematochezia -Normal iron studies 08/18/20 -BUN elevated out of proportion to Cr most recently, consistent with upper GI bleeding  Type 2 neurofibromatosis, quadriplegia,  blindness  Seizures  Recent CVA, on 81 mg ASA qd  Plan: Discussed options with patient's mother to include medical management (PPI) versus EGD.  Discussed risks associated with EGD, including pulmonary compromise, as patient has recently had aspiration pneumonia.  After thorough discussion, patient's mother states she prefers to proceed with medical management at this time.  Initiate Protonix 40 mg IV twice daily.  Continue to monitor H&H with transfusion as needed to maintain hemoglobin greater than seven.  [facility] will follow.   LOS: 27 days   Salley Slaughter  PA-C 09/04/2020, 2:31 PM  Contact #  [phone] 

## 2020-09-04 NOTE — Progress Notes (Signed)
Spoke with Dr. Lonny Prude verbal order given to d/c every 4 hour [facility] screening.

## 2020-09-05 ENCOUNTER — Inpatient Hospital Stay (HOSPITAL_COMMUNITY): Payer: Medicare Other

## 2020-09-05 DIAGNOSIS — I6330 Cerebral infarction due to thrombosis of unspecified cerebral artery: Secondary | ICD-10-CM | POA: Diagnosis not present

## 2020-09-05 DIAGNOSIS — J9601 Acute respiratory failure with hypoxia: Secondary | ICD-10-CM | POA: Diagnosis not present

## 2020-09-05 DIAGNOSIS — Q8502 Neurofibromatosis, type 2: Secondary | ICD-10-CM | POA: Diagnosis not present

## 2020-09-05 DIAGNOSIS — R569 Unspecified convulsions: Secondary | ICD-10-CM | POA: Diagnosis not present

## 2020-09-05 HISTORY — PX: IR GASTROSTOMY TUBE MOD SED: IMG625

## 2020-09-05 LAB — GLUCOSE, CAPILLARY
Glucose-Capillary: 103 mg/dL — ABNORMAL HIGH (ref 70–99)
Glucose-Capillary: 105 mg/dL — ABNORMAL HIGH (ref 70–99)
Glucose-Capillary: 119 mg/dL — ABNORMAL HIGH (ref 70–99)
Glucose-Capillary: 136 mg/dL — ABNORMAL HIGH (ref 70–99)
Glucose-Capillary: 69 mg/dL — ABNORMAL LOW (ref 70–99)
Glucose-Capillary: 71 mg/dL (ref 70–99)
Glucose-Capillary: 98 mg/dL (ref 70–99)
Glucose-Capillary: 99 mg/dL (ref 70–99)

## 2020-09-05 LAB — BASIC METABOLIC PANEL
Anion gap: 7 (ref 5–15)
BUN: 29 mg/dL — ABNORMAL HIGH (ref 6–20)
CO2: 26 mmol/L (ref 22–32)
Calcium: 8 mg/dL — ABNORMAL LOW (ref 8.9–10.3)
Chloride: 105 mmol/L (ref 98–111)
Creatinine, Ser: <0.3 mg/dL — ABNORMAL LOW (ref 0.61–1.24)
Glucose, Bld: 106 mg/dL — ABNORMAL HIGH (ref 70–99)
Potassium: 4.2 mmol/L (ref 3.5–5.1)
Sodium: 138 mmol/L (ref 135–145)

## 2020-09-05 LAB — CBC
HCT: 24.9 % — ABNORMAL LOW (ref 39.0–52.0)
Hemoglobin: 7.6 g/dL — ABNORMAL LOW (ref 13.0–17.0)
MCH: 26.5 pg (ref 26.0–34.0)
MCHC: 30.5 g/dL (ref 30.0–36.0)
MCV: 86.8 fL (ref 80.0–100.0)
Platelets: 153 10*3/uL (ref 150–400)
RBC: 2.87 MIL/uL — ABNORMAL LOW (ref 4.22–5.81)
RDW: 19.1 % — ABNORMAL HIGH (ref 11.5–15.5)
WBC: 9.7 10*3/uL (ref 4.0–10.5)
nRBC: 0 % (ref 0.0–0.2)

## 2020-09-05 MED ORDER — MIDAZOLAM HCL 2 MG/2ML IJ SOLN
INTRAMUSCULAR | Status: AC | PRN
  Administered 2020-09-05: 1 mg via INTRAVENOUS

## 2020-09-05 MED ORDER — FENTANYL CITRATE (PF) 100 MCG/2ML IJ SOLN
INTRAMUSCULAR | Status: AC | PRN
Start: 2020-09-05 — End: 2020-09-05
  Administered 2020-09-05: 25 ug via INTRAVENOUS

## 2020-09-05 MED ORDER — LIDOCAINE HCL 1 % IJ SOLN
INTRAMUSCULAR | Status: AC | PRN
  Administered 2020-09-05: 30 mL via INTRADERMAL

## 2020-09-05 MED ORDER — FENTANYL CITRATE (PF) 100 MCG/2ML IJ SOLN
INTRAMUSCULAR | Status: AC
  Filled 2020-09-05: qty 2

## 2020-09-05 MED ORDER — GLUCAGON HCL (RDNA) 1 MG IJ SOLR
INTRAMUSCULAR | Status: AC | PRN
  Administered 2020-09-05: .5 mg via INTRAVENOUS

## 2020-09-05 MED ORDER — DEXTROSE 50 % IV SOLN
INTRAVENOUS | Status: AC
  Administered 2020-09-05: 25 mL
  Filled 2020-09-05: qty 50

## 2020-09-05 MED ORDER — GLUCAGON HCL RDNA (DIAGNOSTIC) 1 MG IJ SOLR
INTRAMUSCULAR | Status: AC
  Filled 2020-09-05: qty 1

## 2020-09-05 MED ORDER — DEXTROSE-NACL 5-0.45 % IV SOLN: INTRAVENOUS | Status: AC

## 2020-09-05 MED ORDER — MIDAZOLAM HCL 2 MG/2ML IJ SOLN
INTRAMUSCULAR | Status: AC
  Filled 2020-09-05: qty 2

## 2020-09-05 MED ORDER — IOHEXOL 300 MG/ML  SOLN
50.0000 mL | Freq: Once | INTRAMUSCULAR | Status: AC | PRN
  Administered 2020-09-05: 20 mL

## 2020-09-05 MED ORDER — LIDOCAINE HCL (PF) 1 % IJ SOLN
INTRAMUSCULAR | Status: AC
  Filled 2020-09-05: qty 30

## 2020-09-05 NOTE — Progress Notes (Signed)
PROGRESS NOTE    Cameron Hale  QVZ:[id number] DOB: 1976/09/22 DOA: 08/08/2020 PCP: Alroy Dust, L.Marlou Sa, MD   Brief Narrative: Cameron Hale is a 44 y.o. malewith medical history significant oftype 2 neurofibromatosis and seizures.Patient was noted to have a generalized seizure prior to admission, consistent in blank staringand beingunresponsive.Through the course of the day he returned to his baseline.  The patient subsequently became much more somnolent, lethargic, and unresponsive.  Patient subsequently transported to [facility] via EMS. On route he had persistent hypotension and he was brought to [facility] emergency department.  Family indicates patient had neurosurgery intervention in March 2021, post surgery he developed functional quadriplegia, blindness and hearing loss,the onlyfullneurologic function preserved is talking. He was discharged August 2021 home with his parents. He needs assistance with this basic activities of daily living including bathing and eating. He does have a decubitus pressure ulcer stage II. He was diagnosed with urinary tract infection about 7 days ago and he had ciprofloxacin prescribed.  In the ED patient noted to be unresponsive, his CT head with advanced meningiomatosis, unchanged vasogenic edema. No acute bleeding. Arterial blood gas analysis with normal pH. Chest radiograph with signs of aspiration pneumonia. [facility] was contacted, unfortunately unable to perform ED to ED transfer. Patient received intravenous fluids, dexamethasone, 2 g of Keppra,2 mg IV lorazepam, aztreonam,vancomycin, cefepime and metronidazole. Referred for admission forfurtherevaluation  9/25 worsening acute hypoxia, patient subsequently intubated,right chest tube x2 9/30 chest tubes were removed 10/4 extubated  10/5 weaned off oxygen, currently on RA 10/6 coretrak placed 10/7 weaned off levophed; mild hypothermia overnight - improving with bairhugger 10/8  stable, continue speech evaluation family requesting reevaluation and consideration for advancement of diet as they are hesitant to place PEG tube  Assessment & Plan:   Principal Problem:   Seizures ([place]) Active Problems:   Paraparesis ([place])   Mixed conductive and sensorineural hearing loss of right ear with unrestricted hearing of left ear   Quadriplegia ([place])   AMS (altered mental status)   Aspiration pneumonia ([place])   Neurofibromatosis 2 ([place])   Acute respiratory failure with hypoxia ([place])   Cerebral thrombosis with cerebral infarction   DNR (do not resuscitate) discussion   History of ETT   Palliative care by specialist   Acute respiratory failure with hypoxia Patient required mechanical ventilation in ICU and was extubated on 10/4. Currently weaned to room air. Resolved.  Pneumonitis Drug induced, secondary to previous monoclonal antibody neurofibromatosis treatment as an outpatient. Currently on steroids with plan for a slow taper over 6 weeks. Patient is also on PJP prophylaxis with Bactrim until prednisone dose is less than 20 mg daily. -Continue Prednisone 40 mg daily with taper and Bactrim prophylaxis  Acute on chronic dysphagia Patient currently has Cortrak in place from 10/6 with tube feeds. Speech therapy on board with recommendations for continued alternate mode of nutrition. PEG tube is recommended and patient/mother have now agreed to placement. SLP reevaluated 9/14 and recommending to continue NPO with ice chips 4-5 timers per day. -Continue tube feeds -PEG tube placement likely today per IR  Lethargy Resolved. Likely related to poor sleep. Had an episode of lethargy/stupur on 10/18 without etiology identified. Mother states he is significantly sleepy around 2 PM every day which has been very consistent. Patient placed back on telemetry. Chest x-ray with basilar infiltrates. -Telemetry  Acute urinary retention Suprapubic. Likely secondary to urinary retention.  Requested bladder scan followed by post-void residual with output of 1400 mL. Patient  now with possible post-obstructive diuresis. Urinalysis not suggestive of infection. Mother states patient was referred for urology follow-up for recurrent UTIs. Urine culture significant for yeast -Fluconazole 200 mg daily empirically in setting of funuria and retention -Voiding trial after a few days of treatment  Hypothermia Unsure if this was accurate but patient improved after a few hours on the [facility]. He has been normothermic since last night. Blood cultures obtained and are no growth to date. Resolved.  Acute metabolic encephalopathy Back to baseline. EEG obtained without evidence of seizures. Resolved.  Right pleural effusion Thought to be transudate of from poor oncotic pressure. Currently resolved.  Hypernatremia Resolved  Hypokalemia Hypomagnesemia Resolved  Pre-diabetes Patient has had hypo- and hyperglycemic episodes. Hemoglobin A1C of 6.1%. -Continue SSI  Labile blood pressure Patient started on midodrine. There is possible autonomic instability related to heavy neurofibromatosis burden on spinal cord. Blood pressure improved and he has some supine elevated blood pressure. Midodrine discontinued  Neurofibromatosis, type 2 Progressive. Patient currently follows at [facility]. Patient is currently on brigatinib therapy which is currently on hold.  Seizure disorder -Continue Keppra; switch to per tube when able to use PEG  Acute on Chronic anemia Iron panel not very definitive. Mostly normocytic on history. Baseline of 8-9. Has been stable around 7-8 this admission with acute drop to 6.5 on 10/15. Unsure of etiology. 1 unit of PRBC given on 10/15. FOBT positive. Blood smear review significant for normocytic anemia. [facility] consulted with recommendations for bowel prep and colonoscopy  Severe protein calorie malnutrition Poor albumin. Currently on tube  feeds as mentioned above. Dietitian is consulted. Speech therapy is consulted.  Left arm swelling Likely related to poor albumin. No tenderness or erythema. Unlikely related to acute DVT but needs to be ruled out. Looks improved. Upper extremity venous duplex negative for DVT. Resolved.  Acute ischemic infarcts Earlier this admission. Bilateral frontal infarcts predominantly and the ACA territory bilaterally.  Small superior vermian acute infarct. Diagnosed on 9/26 by neurology and placed on aspirin 81 mg daily. -Continue Aspirin 81 mg daily  Pressure injury Sacrum, POA.   DVT prophylaxis: Lovenox Code Status:   Code Status: Full Code Family Communication: Mother at bedside Disposition Plan: Plan for discharge home pending insertion of PEG tube and resumption of tube feeds with stability. Anticipate 3-5 days. Home with home health PT, OT, SLP, RN, home aide when ready.   Consultants:   PCCM  Palliative care medicine  [facility]  Procedures:     Antimicrobials:  Vanc 9/21 >> 9/24  Aztreonam 9/21  Flagyl 9/21 >> 9/25  Cefepime 9/21 >> 9/25  Ceftriaxone 9/26 >> 9/29  Bactrim 10/1 >> ongoing   Subjective: No concerns today. No paraesthesias.  Objective: Vitals:   09/04/20 2000 09/05/20 0010 09/05/20 0407 09/05/20 0734  BP: (!) 117/96 110/74 98/61 114/73  Pulse: 82 94 84 81  Resp: 14 14 14 19   Temp: 99.1 F (37.3 C) 98.2 F (36.8 C) 98.1 F (36.7 C) 97.9 F (36.6 C)  TempSrc: Oral Oral Oral Oral  SpO2: 97% 97% 97% 99%  Height:        Intake/Output Summary (Last 24 hours) at 09/05/2020 1038 Last data filed at 09/05/2020 0400 Gross per 24 hour  Intake 2590.3 ml  Output 2200 ml  Net 390.3 ml   Filed Weights    Examination:  General exam: Appears calm and comfortable Respiratory system: Clear to auscultation. Respiratory effort normal. Cardiovascular system: S1 & S2  heard, RRR. No murmurs, rubs, gallops or clicks. Gastrointestinal  system: Abdomen is nondistended, soft and nontender. No organomegaly or masses felt. Normal bowel sounds heard. Central nervous system: Alert and oriented. Some contractures. Musculoskeletal: No edema. No calf tenderness. Muscle loss. Skin: No cyanosis. No rashes Psychiatry: Judgement and insight appear normal. Mood & affect appropriate.   Data Reviewed: I have personally reviewed following labs and imaging studies  CBC Lab Results  Component Value Date   WBC 9.7 09/05/2020   RBC 2.87 (L) 09/05/2020   HGB 7.6 (L) 09/05/2020   HCT 24.9 (L) 09/05/2020   MCV 86.8 09/05/2020   MCH 26.5 09/05/2020   PLT 153 09/05/2020   MCHC 30.5 09/05/2020   RDW 19.1 (H) 09/05/2020   LYMPHSABS 0.8 08/08/2020   MONOABS 0.4 08/08/2020   EOSABS 0.0 08/08/2020   BASOSABS 0.0 57/32/2025     Last metabolic panel Lab Results  Component Value Date   NA 138 09/05/2020   K 4.2 09/05/2020   CL 105 09/05/2020   CO2 26 09/05/2020   BUN 29 (H) 09/05/2020   CREATININE <0.30 (L) 09/05/2020   GLUCOSE 106 (H) 09/05/2020   GFRNONAA NOT CALCULATED 09/05/2020   GFRAA NOT CALCULATED 08/22/2020   CALCIUM 8.0 (L) 09/05/2020   PHOS 2.2 (L) 08/17/2020   PROT 4.1 (L) 09/04/2020   ALBUMIN 1.7 (L) 09/04/2020   BILITOT 0.3 09/04/2020   ALKPHOS 71 09/04/2020   AST 20 09/04/2020   ALT 52 (H) 09/04/2020   ANIONGAP 7 09/05/2020    CBG (last 3)  Recent Labs    09/05/20 0009 09/05/20 0356 09/05/20 0655  GLUCAP 136* 119* 71     GFR: CrCl cannot be calculated (This lab value cannot be used to calculate CrCl because it is not a number: <0.30).  Coagulation Profile: Recent Labs  Lab 09/04/20 1333  INR 0.9    Recent Results (from the past 240 hour(s))  Culture, blood (routine x 2)     Status: None (Preliminary result)   Collection Time: 09/01/20  4:08 PM   Specimen: BLOOD LEFT HAND  Result Value Ref Range Status   Specimen Description BLOOD LEFT HAND  Final   Special Requests   Final    BOTTLES DRAWN  AEROBIC AND ANAEROBIC Blood Culture adequate volume   Culture   Final    NO GROWTH 4 DAYS Performed at [facility], [place] [address], [place], [place] [address]    Report Status PENDING  Incomplete  Culture, blood (routine x 2)     Status: None (Preliminary result)   Collection Time: 09/01/20  4:16 PM   Specimen: BLOOD  Result Value Ref Range Status   Specimen Description BLOOD LEFT ANTECUBITAL  Final   Special Requests   Final    BOTTLES DRAWN AEROBIC AND ANAEROBIC Blood Culture adequate volume   Culture   Final    NO GROWTH 4 DAYS Performed at [facility], [place] [address] [place], [place] [address]    Report Status PENDING  Incomplete  Culture, Urine     Status: Abnormal   Collection Time: 09/03/20 10:54 AM   Specimen: Urine, Random  Result Value Ref Range Status   Specimen Description URINE, RANDOM  Final   Special Requests   Final    NONE Performed at [facility], [place] [address] [place], [place] [address]    Culture 30,000 COLONIES/mL YEAST (A)  Final   Report Status 09/04/2020 FINAL  Final  Radiology Studies: DG CHEST PORT 1 VIEW  Result Date: 09/04/2020 CLINICAL DATA:  History of airway aspiration EXAM: PORTABLE CHEST 1 VIEW COMPARISON:  08/20/2020 FINDINGS: Right PICC line remains in place, unchanged. Interval removal of endotracheal tube. OG tube enters the stomach. Heart is normal size. Bilateral perihilar and lower lobe opacities. No effusions or pneumothorax. No acute bony abnormality. IMPRESSION: Perihilar and lower lobe airspace opacities could reflect edema or infection. Electronically Signed   By: Rolm Baptise M.D.   On: 09/04/2020 17:50   DG Abd Portable 1V  Result Date: 09/03/2020 CLINICAL DATA:  Constipation. EXAM: PORTABLE ABDOMEN - 1 VIEW COMPARISON:  CT abdomen 09/01/2020. Abdominal radiograph 08/14/2020. FINDINGS: A feeding tube remains in place with tip in the region of the distal stomach or first portion of the duodenum. Gas  is present throughout nondilated colon without evidence of an increased stool burden. Gas is also present in scattered nondilated loops of small bowel. There is no evidence of bowel obstruction. Extensive thoracolumbar spinal fusion is noted. IMPRESSION: Nonobstructed bowel gas pattern. Electronically Signed   By: Logan Bores M.D.   On: 09/03/2020 14:44   VAS [place] UPPER EXTREMITY VENOUS DUPLEX  Result Date: 09/03/2020 UPPER VENOUS STUDY  Indications: Swelling Risk Factors: None identified. Comparison Study: No prior studies. Performing Technologist: Oliver Hum RVT  Examination Guidelines: A complete evaluation includes B-mode imaging, spectral Doppler, color Doppler, and power Doppler as needed of all accessible portions of each vessel. Bilateral testing is considered an integral part of a complete examination. Limited examinations for reoccurring indications may be performed as noted.  Right Findings: +----------+------------+---------+-----------+----------+-------+ RIGHT     CompressiblePhasicitySpontaneousPropertiesSummary +----------+------------+---------+-----------+----------+-------+ Subclavian    Full       Yes       Yes                      +----------+------------+---------+-----------+----------+-------+  Left Findings: +----------+------------+---------+-----------+----------+-------+ LEFT      CompressiblePhasicitySpontaneousPropertiesSummary +----------+------------+---------+-----------+----------+-------+ IJV           Full       Yes       Yes                      +----------+------------+---------+-----------+----------+-------+ Subclavian    Full       Yes       Yes                      +----------+------------+---------+-----------+----------+-------+ Axillary      Full       Yes       Yes                      +----------+------------+---------+-----------+----------+-------+ Brachial      Full       Yes       Yes                       +----------+------------+---------+-----------+----------+-------+ Radial        Full                                          +----------+------------+---------+-----------+----------+-------+ Ulnar         Full                                          +----------+------------+---------+-----------+----------+-------+  Cephalic      Full                                          +----------+------------+---------+-----------+----------+-------+ Basilic       Full                                          +----------+------------+---------+-----------+----------+-------+  Summary:  Right: No evidence of thrombosis in the subclavian.  Left: No evidence of deep vein thrombosis in the upper extremity. No evidence of superficial vein thrombosis in the upper extremity.  *See table(s) above for measurements and observations.  Diagnosing physician: Monica Martinez MD Electronically signed by Monica Martinez MD on 09/03/2020 at 3:00:15 PM.    Final         Scheduled Meds: . aspirin  81 mg Per Tube Daily  . chlorhexidine  15 mL Mouth Rinse BID  . Chlorhexidine Gluconate Cloth  6 each Topical Daily  . collagenase   Topical BID  . feeding supplement (PROSource TF)  45 mL Per Tube TID  . free water  100 mL Per Tube Q8H  . glycopyrrolate  0.1 mg Intravenous TID  . insulin aspart  0-9 Units Subcutaneous Q4H  . pantoprazole (PROTONIX) IV  40 mg Intravenous Q12H  . predniSONE  40 mg Per Tube Q breakfast  . sodium chloride flush  10-40 mL Intracatheter Q12H  . sulfamethoxazole-trimethoprim  1 tablet Per Tube Once per day on Mon Wed Fri   Continuous Infusions: . dextrose 5 % and 0.9% NaCl 50 mL/hr at 09/05/20 0047  . feeding supplement (OSMOLITE 1.5 CAL) 1,000 mL (09/04/20 1636)  . levETIRAcetam 1,500 mg (09/05/20 7169)  . vancomycin       LOS: 28 days     Cordelia Poche, MD [facility] 09/05/2020, 10:38 AM  If 7PM-7AM, please contact  night-coverage [email or web address]

## 2020-09-05 NOTE — Progress Notes (Signed)
This chaplain returned a phone call from the Pt. Cameron Hale [phone]. The chaplain understands [name] is requesting a chaplain stop by the Pt. room and complete an Advance Directive with the Pt. on Wednesday morning. Spiritual care will F/U with the Pt. on Wednesday.

## 2020-09-05 NOTE — Progress Notes (Signed)
OT Cancellation Note  Patient Details Name: MARICO BUCKLE [name]: [id number] DOB: 1976-06-01   Cancelled Treatment:    Reason Eval/Treat Not Completed: Patient at procedure or test/ unavailable; currently out of room. Will follow up for OT session as able.  Lou Cal, OT Acute Rehabilitation Services Pager [phone] Office [place] 09/05/2020, 3:52 PM

## 2020-09-05 NOTE — Progress Notes (Signed)
Hemoglobin is slightly lower today, but overall is hovering in the high 7 range:    6.8 -> 1 unit prc's -> 8.0 -> 7.8 -> 8.2 -> 7.6 today.  We will continue observation, currently (temporarily) on IV PPI therapy.  For probable IR gastrostomy tube placement today.  Cleotis Nipper, M.D. Pager [phone] If no answer or after 5 PM call [phone]

## 2020-09-05 NOTE — Progress Notes (Signed)
Nutrition Follow-up  DOCUMENTATION CODES:   Not applicable  INTERVENTION:  Resume enteral nutrition once PEG placed and ready to use.  Osmolite 1.5 formula at goal rate of 55 ml/hr.   Continue 45 ml Prosource TF TID per tube.   Free water flushes of 100 ml q 8 hours. (MD to adjust as appropriate)  Tube feeding to provide 2100 kcal, 116 grams of protein,and 1303 ml of free water.   NUTRITION DIAGNOSIS:   Increased nutrient needs related to wound healing as evidenced by estimated needs; ongoing  GOAL:   Patient will meet greater than or equal to 90% of their needs; met with TF  MONITOR:   TF tolerance, Skin, Weight trends, Labs, I & O's  REASON FOR ASSESSMENT:   Ventilator, Consult Enteral/tube feeding initiation and management, Assessment of nutrition requirement/status  ASSESSMENT:   44 year old male who presented to the ED on 9/21 with AMS. 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 Pt admitted with sepsis, left upper lobe aspiration pneumonia  09/25 - intubated, chest tube insertion 09/30 - chest tube removed 10/04 - extubated 10/06 - Cortrak NGT placed, tip of tube in stomach  Plans for PEG placement. Recommend continuation of tube feeding once PEG is placed and ready for use. Labs and medications reviewed.   Diet Order:   Diet Order            Diet NPO time specified  Diet effective now                 EDUCATION NEEDS:   No education needs have been identified at this time  Skin:  Skin Assessment: Skin Integrity Issues: Skin Integrity Issues:: Unstageable Unstageable: sacrum  Last BM:  10/18  Height:   Ht Readings from Last 1 Encounters:  08/12/20 '5\' 10"'  (1.778 m)    Weight:   Wt Readings from Last 1 Encounters:  05/30/20 65.6 kg    Ideal Body Weight:  68 kg (adjusted for quadriplegia)  BMI:  Body mass index is 22.9  kg/m.  Estimated Nutritional Needs:   Kcal:  2000-2200  Protein:  110-130 grams  Fluid:  >/= 1.8 L  Corrin Parker, MS, RD, LDN RD pager number/after hours weekend pager number on Amion.

## 2020-09-05 NOTE — Procedures (Signed)
Interventional Radiology Procedure Note  Procedure: Gastrostomy tube placement  Complications: None  Estimated Blood Loss: < 10 mL  Findings: 20 Fr bumper retention gastrostomy tube placed with tip in body of stomach. OK to use in 24 hours.  Boneta Standre T. Jessicia Napolitano, M.D Pager:  [phone]   

## 2020-09-05 NOTE — Progress Notes (Signed)

## 2020-09-06 DIAGNOSIS — R569 Unspecified convulsions: Secondary | ICD-10-CM | POA: Diagnosis not present

## 2020-09-06 LAB — CBC
HCT: 23.8 % — ABNORMAL LOW (ref 39.0–52.0)
Hemoglobin: 7.4 g/dL — ABNORMAL LOW (ref 13.0–17.0)
MCH: 27.4 pg (ref 26.0–34.0)
MCHC: 31.1 g/dL (ref 30.0–36.0)
MCV: 88.1 fL (ref 80.0–100.0)
Platelets: 122 10*3/uL — ABNORMAL LOW (ref 150–400)
RBC: 2.7 MIL/uL — ABNORMAL LOW (ref 4.22–5.81)
RDW: 19.4 % — ABNORMAL HIGH (ref 11.5–15.5)
WBC: 8.5 10*3/uL (ref 4.0–10.5)
nRBC: 0 % (ref 0.0–0.2)

## 2020-09-06 LAB — BASIC METABOLIC PANEL
Anion gap: 6 (ref 5–15)
BUN: 23 mg/dL — ABNORMAL HIGH (ref 6–20)
CO2: 25 mmol/L (ref 22–32)
Calcium: 7.5 mg/dL — ABNORMAL LOW (ref 8.9–10.3)
Chloride: 106 mmol/L (ref 98–111)
Creatinine, Ser: <0.3 mg/dL — ABNORMAL LOW (ref 0.61–1.24)
Glucose, Bld: 105 mg/dL — ABNORMAL HIGH (ref 70–99)
Potassium: 4.1 mmol/L (ref 3.5–5.1)
Sodium: 137 mmol/L (ref 135–145)

## 2020-09-06 LAB — CULTURE, BLOOD (ROUTINE X 2)
Culture: NO GROWTH
Culture: NO GROWTH
Special Requests: ADEQUATE
Special Requests: ADEQUATE

## 2020-09-06 LAB — GLUCOSE, CAPILLARY
Glucose-Capillary: 103 mg/dL — ABNORMAL HIGH (ref 70–99)
Glucose-Capillary: 105 mg/dL — ABNORMAL HIGH (ref 70–99)
Glucose-Capillary: 117 mg/dL — ABNORMAL HIGH (ref 70–99)
Glucose-Capillary: 129 mg/dL — ABNORMAL HIGH (ref 70–99)
Glucose-Capillary: 136 mg/dL — ABNORMAL HIGH (ref 70–99)
Glucose-Capillary: 136 mg/dL — ABNORMAL HIGH (ref 70–99)

## 2020-09-06 NOTE — Progress Notes (Signed)
This chaplain followed up on Pt. mother's request to complete an Advance Directive with the Pt.  The Pt. is sleeping at time of arrival.  The Pt. did not wake up to the call of his name x2.. The chaplain understands from communication with the Pt. RN-Nicole, the Pt. sleeps very soundly.  The chaplain will F/U on Thursday.

## 2020-09-06 NOTE — Progress Notes (Signed)
Referring Physician(s): Nettey,R  Supervising Physician: Suttle,D  Patient Status:  [facility] - In-pt  Chief Complaint:  dysphagia  Subjective: Pt remains somnolent; mother in room; vitals ok   Allergies: Amoxicillin and Morphine  Medications: Prior to Admission medications   Medication Sig Start Date End Date Taking? Authorizing Provider  acetaminophen (TYLENOL) 325 MG tablet Take 2 tablets (650 mg total) by mouth every 6 (six) hours as needed for mild pain or fever. 06/05/20  Yes Angiulli, Lavon Paganini, PA-C  baclofen (LIORESAL) 10 MG tablet Take 1 tablet (10 mg total) by mouth 2 (two) times daily. 06/14/20  Yes Lovorn, Megan, MD  brigatinib (ALUNBRIG) 90 & 180 MG TBPK Take 90-180 mg by mouth daily.  06/07/20  Yes [provider]  carboxymethylcellulose (REFRESH TEARS) 0.5 % SOLN Place 1 drop into both eyes daily as needed (For dry eyes).   Yes [provider]  ciprofloxacin (CIPRO) 500 MG tablet Take 500 mg by mouth 2 (two) times daily.  08/01/20  Yes [provider]  dexamethasone (DECADRON) 2 MG tablet Take 2 mg by mouth daily.  06/21/20  Yes [provider]  famotidine (PEPCID) 20 MG tablet Take 1 tablet (20 mg total) by mouth 2 (two) times daily. 07/21/20  Yes Lovorn, Jinny Blossom, MD  levETIRAcetam (KEPPRA) 500 MG tablet Take 3 tablets (1,500 mg total) by mouth 2 (two) times daily. 06/05/20 08/08/20 Yes Angiulli, Lavon Paganini, PA-C  propranolol (INDERAL) 10 MG tablet Take 1 tablet (10 mg total) by mouth 2 (two) times daily. 06/05/20  Yes Angiulli, Lavon Paganini, PA-C  ammonium lactate (AMLACTIN) 12 % lotion Apply 1 application topically as needed for dry skin. Patient not taking: Reported on 08/08/2020 06/28/20   Felipa Furnace, DPM  bisacodyl (DULCOLAX) 10 MG suppository Place 1 suppository (10 mg total) rectally daily at 6 PM. Patient not taking: Reported on 08/08/2020 06/05/20   Angiulli, Lavon Paganini, PA-C     Vital Signs: BP 101/61 (BP Location: Left Arm)   Pulse 88    Temp 98.2 F (36.8 C) (Oral)   Resp 18   Ht 5' 10" (1.778 m)   Wt 159 lb 9.8 oz (72.4 kg) Comment: bed has no weighing scale,pt previous weight  SpO2 99%   BMI 22.90 kg/m   Physical Exam pt sleeping, G tube intact, insertion site ok, no leaking; abd soft,ND  Imaging: IR GASTROSTOMY TUBE MOD SED  Result Date: 09/05/2020 CLINICAL DATA:  Seizure, aspiration pneumonia, respiratory failure and need for percutaneous gastrostomy tube for nutrition. History of quadriplegia. EXAM: PERCUTANEOUS GASTROSTOMY TUBE PLACEMENT ANESTHESIA/SEDATION: 1.0 mg IV Versed; 25 mcg IV Fentanyl. Total Moderate Sedation Time 14 minutes. The patient's level of consciousness and physiologic status were continuously monitored during the procedure by Radiology nursing. CONTRAST:  49m OMNIPAQUE IOHEXOL 300 MG/ML  SOLN MEDICATIONS: 1 g IV vancomycin. IV antibiotic was administered in an appropriate time interval prior to needle puncture of the skin. 0.5 mg IV glucagon FLUOROSCOPY TIME:  3.0 minutes.  14.0 mGy. PROCEDURE: The procedure, risks, benefits, and alternatives were explained to the patient's father. Questions regarding the procedure were encouraged and answered. The patient's father understands and consents to the procedure. The evening prior to the procedure, the patient was given thin liquid barium via a feeding tube in order to opacify the colon. A 5-French catheter was then advanced through the patient's mouth under fluoroscopy into the esophagus and to the level of the stomach. This catheter was used to insufflate the stomach with  air under fluoroscopy. The abdominal wall was prepped with chlorhexidine in a sterile fashion, and a sterile drape was applied covering the operative field. A sterile gown and sterile gloves were used for the procedure. Local anesthesia was provided with 1% Lidocaine. A skin incision was made in the upper abdominal wall. Under fluoroscopy, an 18 gauge trocar needle was advanced into the  stomach. Contrast injection was performed to confirm intraluminal position of the needle tip. A single T tack was then deployed in the lumen of the stomach. This was brought up to tension at the skin surface. Over a guidewire, a 9-French sheath was advanced into the lumen of the stomach. The wire was left in place as a safety wire. A loop snare device from a percutaneous gastrostomy kit was then advanced into the stomach. A floppy guide wire was advanced through the orogastric catheter under fluoroscopy in the stomach. The loop snare advanced through the percutaneous gastric access was used to snare the guide wire. This allowed withdrawal of the loop snare out of the patient's mouth by retraction of the orogastric catheter and wire. A 20-French bumper retention gastrostomy tube was looped around the snare device. It was then pulled back through the patient's mouth. The retention bumper was brought up to the anterior gastric wall. The T tack suture was cut at the skin. The exiting gastrostomy tube was cut to appropriate length and a feeding adapter applied. The catheter was injected with contrast material to confirm position and a fluoroscopic spot image saved. The tube was then flushed with saline. A dressing was applied over the gastrostomy exit site. COMPLICATIONS: None. FINDINGS: Initial fluoroscopy demonstrates adequate opacification of the colon by ingested barium in order to prevent colonic injury during the procedure. The stomach distended well with air allowing safe placement of the gastrostomy tube. After placement, the tip of the gastrostomy tube lies in the body of the stomach. IMPRESSION: Percutaneous gastrostomy with placement of a 20-French bumper retention tube in the body of the stomach. This tube can be used for percutaneous feeds beginning in 24 hours after placement. Electronically Signed   By: Glenn  Yamagata M.D.   On: 09/05/2020 16:21   DG CHEST PORT 1 VIEW  Result Date:  09/04/2020 CLINICAL DATA:  History of airway aspiration EXAM: PORTABLE CHEST 1 VIEW COMPARISON:  08/20/2020 FINDINGS: Right PICC line remains in place, unchanged. Interval removal of endotracheal tube. OG tube enters the stomach. Heart is normal size. Bilateral perihilar and lower lobe opacities. No effusions or pneumothorax. No acute bony abnormality. IMPRESSION: Perihilar and lower lobe airspace opacities could reflect edema or infection. Electronically Signed   By: Kevin  Dover M.D.   On: 09/04/2020 17:50   DG Abd Portable 1V  Result Date: 09/03/2020 CLINICAL DATA:  Constipation. EXAM: PORTABLE ABDOMEN - 1 VIEW COMPARISON:  CT abdomen 09/01/2020. Abdominal radiograph 08/14/2020. FINDINGS: A feeding tube remains in place with tip in the region of the distal stomach or first portion of the duodenum. Gas is present throughout nondilated colon without evidence of an increased stool burden. Gas is also present in scattered nondilated loops of small bowel. There is no evidence of bowel obstruction. Extensive thoracolumbar spinal fusion is noted. IMPRESSION: Nonobstructed bowel gas pattern. Electronically Signed   By: Allen  Grady M.D.   On: 09/03/2020 14:44   VAS US UPPER EXTREMITY VENOUS DUPLEX  Result Date: 09/03/2020 UPPER VENOUS STUDY  Indications: Swelling Risk Factors: None identified. Comparison Study: No prior studies. Performing Technologist: Gregory Collins RVT    Examination Guidelines: A complete evaluation includes B-mode imaging, spectral Doppler, color Doppler, and power Doppler as needed of all accessible portions of each vessel. Bilateral testing is considered an integral part of a complete examination. Limited examinations for reoccurring indications may be performed as noted.  Right Findings: +----------+------------+---------+-----------+----------+-------+ RIGHT     CompressiblePhasicitySpontaneousPropertiesSummary +----------+------------+---------+-----------+----------+-------+  Subclavian    Full       Yes       Yes                      +----------+------------+---------+-----------+----------+-------+  Left Findings: +----------+------------+---------+-----------+----------+-------+ LEFT      CompressiblePhasicitySpontaneousPropertiesSummary +----------+------------+---------+-----------+----------+-------+ IJV           Full       Yes       Yes                      +----------+------------+---------+-----------+----------+-------+ Subclavian    Full       Yes       Yes                      +----------+------------+---------+-----------+----------+-------+ Axillary      Full       Yes       Yes                      +----------+------------+---------+-----------+----------+-------+ Brachial      Full       Yes       Yes                      +----------+------------+---------+-----------+----------+-------+ Radial        Full                                          +----------+------------+---------+-----------+----------+-------+ Ulnar         Full                                          +----------+------------+---------+-----------+----------+-------+ Cephalic      Full                                          +----------+------------+---------+-----------+----------+-------+ Basilic       Full                                          +----------+------------+---------+-----------+----------+-------+  Summary:  Right: No evidence of thrombosis in the subclavian.  Left: No evidence of deep vein thrombosis in the upper extremity. No evidence of superficial vein thrombosis in the upper extremity.  *See table(s) above for measurements and observations.  Diagnosing physician: Monica Martinez MD Electronically signed by Monica Martinez MD on 09/03/2020 at 3:00:15 PM.    Final     Labs:  CBC: Recent Labs    09/02/20 0500 09/03/20 1310 09/05/20 0409 09/06/20 0416  WBC 9.6 9.6 9.7 8.5  HGB 7.8* 8.2* 7.6* 7.4*   HCT 25.3* 26.2* 24.9* 23.8*  PLT 191 176 153 122*    COAGS: Recent Labs  08/08/20 1403 09/04/20 1333  INR 1.0 0.9  APTT 39*  --     BMP: Recent Labs    08/19/20 0500 08/19/20 0500 08/20/20 0225 08/20/20 1553 08/21/20 0445 08/21/20 0445 08/22/20 0447 08/22/20 1854 09/02/20 0500 09/04/20 2120 09/05/20 0409 09/06/20 0416  NA 141   < > 142   < > 142   < > 144   < > 141 139 138 137  K 4.0   < > 4.3   < > 3.7   < > 4.4   < > 4.5 4.0 4.2 4.1  CL 105   < > 107  --  109   < > 111   < > 107 106 105 106  CO2 25   < > 26  --  27   < > 24   < > 28 26 26 25  GLUCOSE 153*   < > 137*  --  149*   < > 84   < > 143* 151* 106* 105*  BUN 41*   < > 38*  --  42*   < > 42*   < > 31* 28* 29* 23*  CALCIUM 8.0*   < > 8.3*  --  8.3*   < > 8.4*   < > 7.9* 7.9* 8.0* 7.5*  CREATININE 0.42*   < > 0.41*  --  0.34*   < > <0.30*   < > 0.31* <0.30* <0.30* <0.30*  GFRNONAA >60   < > >60  --  >60   < > NOT CALCULATED   < > >60 NOT CALCULATED NOT CALCULATED NOT CALCULATED  GFRAA >60  --  >60  --  >60  --  NOT CALCULATED  --   --   --   --   --    < > = values in this interval not displayed.    LIVER FUNCTION TESTS: Recent Labs    08/20/20 0225 08/21/20 0445 08/22/20 0447 09/04/20 2120  BILITOT 0.6 0.4 0.4 0.3  AST 37 34 27 20  ALT 92* 93* 91* 52*  ALKPHOS 80 72 69 71  PROT 4.7* 4.6* 4.7* 4.1*  ALBUMIN 1.9* 1.9* 2.0* 1.7*    Assessment and Plan: Pt with hx seizures, asp PNA, dysphagia, quadriplegia; s/p perc G tube 10/19; afebrile; WBC nl; hgb 7.4(7.6), creat <0.30; G tube site stable; ok to use tube; additional plans as per primary team   Electronically Signed: D. Kevin Allred, PA-C 09/06/2020, 10:48 AM   I spent a total of 15 minutes at the the patient's bedside AND on the patient's hospital floor or unit, greater than 50% of which was counseling/coordinating care for gastrostomy tube    Patient ID: Cameron Hale, male   DOB: 09/17/1976, 44 y.o.   [name]: [id number]  

## 2020-09-06 NOTE — Progress Notes (Signed)
Occupational Therapy Progress Note  Right wrist in place.  When It was removed, mild redness noted but it dissipated quickly.  Wear schedule established - 4 hours on/4 hours off and was posted above bed and RN notified.   Will monitor.     09/06/20 1600  OT Visit Information  Last OT Received On 09/06/20  Assistance Needed +2  History of Present Illness 44 yo admitted 9/21 with unresponsiveness and seizures. Intubated 9/25-10/4. MRI with bilcerebral frontal infarcts. PMhx: neurofibromatosis with neurosurgical intervention march 2021 with resultant quadriplegia, seizure disorder,left eye blindness, hearing loss.  He was on CIR 6/23-7/16/2021  Precautions  Precautions Other (comment)  Precaution Comments quadriplegia  Pain Assessment  Pain Assessment Faces  Faces Pain Scale 0  Cognition  Arousal/Alertness Lethargic;Awake/alert  General Comments Pt sleeping soundly   Other Exercises  Other Exercises Rt wrist splint in place.   It was removed with minor redness noted that dissipated quickly.   Splint reapplied and schedule established for 4 hours on and 4 hours off.  Sign posted above bed and spoke with RN   OT - End of Session  Activity Tolerance Patient tolerated treatment well  Patient left in bed;with call bell/phone within reach  Nurse Communication Mobility status  OT Assessment/Plan  OT Plan Discharge plan remains appropriate  OT Visit Diagnosis Muscle weakness (generalized) (M62.81);Cognitive communication deficit (R41.841)  OT Frequency (ACUTE ONLY) Min 2X/week  Follow Up Recommendations Home health OT;Supervision/Assistance - 24 hour  OT Equipment None recommended by OT  AM-PAC OT "6 Clicks" Daily Activity Outcome Measure (Version 2)  Help from another person eating meals? 1  Help from another person taking care of personal grooming? 1  Help from another person toileting, which includes using toliet, bedpan, or urinal? 1  Help from another person bathing (including washing,  rinsing, drying)? 1  Help from another person to put on and taking off regular upper body clothing? 1  Help from another person to put on and taking off regular lower body clothing? 1  6 Click Score 6  OT Goal Progression  Progress towards OT goals Progressing toward goals (goal added )  OT Time Calculation  OT Start Time (ACUTE ONLY) 1536  OT Stop Time (ACUTE ONLY) 1547  OT Time Calculation (min) 11 min  OT General Charges  $OT Visit 1 Visit  OT Treatments  $Orthotics Fit/Training 8-22 mins  Nilsa Nutting., OTR/L Acute Rehabilitation Services Pager [phone] Office [phone]

## 2020-09-06 NOTE — Progress Notes (Signed)
This chaplain returned a phone call to the Pt. Mother-Cameron Hale. The chaplain understands the Pt. father will be bedside from 9-1pm on Thursday. The chaplain agreed to meet the father bedside to F/U on the AD conversation.

## 2020-09-06 NOTE — Progress Notes (Signed)
PROGRESS NOTE    Cameron Hale  VOZ:[id number] DOB: Mar 17, 1976 DOA: 08/08/2020 PCP: Cameron Hale, L.Marlou Sa, MD   Chief Complaint  Patient presents with  . Hypotension    Brief Narrative: 44 y.o. malewith medical history significant oftype 2 neurofibromatosis and seizures.Patient was noted to have a generalized seizure prior to admission, consistent in blank staringand beingunresponsive.Through the course of the day he returned to his baseline. The patient subsequently became much more somnolent, lethargic, and unresponsive. Patient subsequently transported to [facility] via EMS. On route he had persistent hypotension and he was brought to [facility] emergency department. Family indicates patient had neurosurgery intervention in March 2021, post surgery he developed functional quadriplegia, blindness and hearing loss,the onlyfullneurologic function preserved is talking. He was discharged August 2021 home with his parents. He needs assistance with this basic activities of daily living including bathing and eating. He does have a decubitus pressure ulcer stage II. He was diagnosed with urinary tract infection about 7 days ago and he had ciprofloxacin prescribed.In the ED patient noted to be unresponsive, his CT head with advanced meningiomatosis, unchanged vasogenic edema. No acute bleeding. Arterial blood gas analysis with normal pH. Chest radiograph with signs of aspiration pneumonia. [facility] was contacted, unfortunately unable to perform ED to ED transfer. Patient received intravenous fluids, dexamethasone, 2 g of Keppra,2 mg IV lorazepam, aztreonam,vancomycin, cefepime and metronidazole. Referred for admission forfurtherevaluation  9/25 worsening acute hypoxia, patient subsequently intubated,right chest tube x2 9/30 chest tubes were removed 10/4 extubated  10/5 weaned off oxygen, currently on RA 10/6 coretrak placed 10/7 weaned off levophed; mild hypothermia  overnight - improving with bairhugger 10/8 stable, continue speech evaluation family requesting reevaluation and consideration for advancement of diet as they are hesitant to place PEG tube 10/19: IR PEG plascement, GI consulted for FOBT+ stool and anemia  Subjective: Was sleepy, mother at bedside, says he has been sleepy since peg. Woke up on tactile stimulation, abel to respond, seems to be waking up more. Started taking to his mother. Quadriplegic, hears only from left ear. On [place]  peg in place, dressing intact  on [place] but not at home Getting cortrak tube feed.    Assessment & Plan:  Acute respiratory failure with hypoxia: Initially needing mechanical ventilation in ICU, extubated 10/4.  Continue supplemental oxygen wean as tolerated.  Continue pulmonary hygiene.  Acute on chronic anemia/FOBT+ stool: [facility] is consulted.  No significant drop in hemoglobin overall trending from 7.4-8 g.  Hemoglobin dropped to 6.5 on 10/15 given 1 unit PRBC.  GI following closely. Recent Labs  Lab 09/01/20 2036 09/02/20 0500 09/03/20 1310 09/05/20 0409 09/06/20 0416  HGB 8.0* 7.8* 8.2* 7.6* 7.4*  HCT 25.6* 25.3* 26.2* 24.9* 23.8*   Pneumonitis: Drug induced secondary to previous monoclonal antibody treatment for neurofibromatosis as outpatient.  On prednisone 40 mg and plan for slow taper over 6 weeks, also with PJP prophylaxis with Bactrim and prednisone daily less than 20 mg daily.  Acute ischemic infarcts: Earlier on admission with bilateral frontal infarcts diagnosed 9/26 by neurology, placed on aspirin 81 mg daily.  Seizure disorder continue Keppra.  Acute on chronic dysphagia: Failed speech eval, underwent PEG placement by IR 10/19 , okay to use in in 24 hours nutrition consulted for tube feed.  Increase activity slowly after starting today.  Hopefully can remove cortrak soon.  Acute metabolic encephalopathy/Lethargy: More lethargic after the procedure but was waking up and was  communicating with his mother.  Continue supportive care, minimize  sedation.  EEG showed no evidence of seizure he was back to baseline prior to PEG-hopefully will perk up.  Continue supportive measures.  Acute urine retention: Started on fluconazole.  Follow-up voiding trial was referred for urology follow-up for recurrent UTIs on outpatient basis  Hypothermia: Resolved after few hours of bair hugger.  Blood cultures are currently no growth to date.  Hyponatremia/hypokalemia/hypomagnesemia: Resolved.  preDiabetes : Diet controlled.  Labile blood pressure patient  was started on midodrine  Neurofibromatosis type II: Progressive.  Followed up at [facility].  Currently on Brigatinib therapy and is on hold.  Left arm swelling negative for DVT in duplex  GOC:DNR.  He is followed by [facility] palliative care services in the community    Nutrition: Diet Order            Diet NPO time specified  Diet effective now                 Nutrition Problem: Increased nutrient needs Etiology: wound healing Signs/Symptoms: estimated needs Interventions: Tube feeding  Body mass index is 22.9 kg/m.  Pressure Ulcer: Pressure Injury 08/09/20 Sacrum Unstageable - Full thickness tissue loss in which the base of the injury is covered by slough (yellow, tan, gray, green or brown) and/or eschar (tan, brown or black) in the wound bed. (Active)  08/09/20 2340  Location: Sacrum  Location Orientation:   Staging: Unstageable - Full thickness tissue loss in which the base of the injury is covered by slough (yellow, tan, gray, green or brown) and/or eschar (tan, brown or black) in the wound bed.  Wound Description (Comments):   Present on Admission: Yes    DVT prophylaxis: Place and maintain sequential compression device Start: 08/11/20 0818 SCDs Start: 08/08/20 1828 Code Status:   Code Status: Full Code  Family Communication: plan of care discussed with patient's mother at  bedside.  Status is: Inpatient Remains inpatient appropriate because:Inpatient level of care appropriate due to severity of illness  Dispo:  Patient From: Home  Planned Disposition: Home  Expected discharge date: 09/08/20  Medically stable for discharge: No  Consultants: pccm, palliative medicine, [facility] Procedures:see note  Culture/Microbiology    Component Value Date/Time   SDES URINE, RANDOM 09/03/2020 1054   Dinosaur  09/03/2020 1054    NONE Performed at [facility], [place] [address], [place],  [address]    CULT 30,000 COLONIES/mL YEAST (A) 09/03/2020 1054   REPTSTATUS 09/04/2020 FINAL 09/03/2020 1054    Other culture-see note  Medications: Scheduled Meds: . aspirin  81 mg Per Tube Daily  . chlorhexidine  15 mL Mouth Rinse BID  . Chlorhexidine Gluconate Cloth  6 each Topical Daily  . collagenase   Topical BID  . feeding supplement (PROSource TF)  45 mL Per Tube TID  . free water  100 mL Per Tube Q8H  . glycopyrrolate  0.1 mg Intravenous TID  . insulin aspart  0-9 Units Subcutaneous Q4H  . pantoprazole (PROTONIX) IV  40 mg Intravenous Q12H  . predniSONE  40 mg Per Tube Q breakfast  . sodium chloride flush  10-40 mL Intracatheter Q12H  . sulfamethoxazole-trimethoprim  1 tablet Per Tube Once per day on Mon Wed Fri   Continuous Infusions: . feeding supplement (OSMOLITE 1.5 CAL) 1,000 mL (09/05/20 2250)  . levETIRAcetam 1,500 mg (09/05/20 2040)    Antimicrobials: Anti-infectives (From admission, onward)   Start     Dose/Rate Route Frequency Ordered Stop   09/05/20 0800  vancomycin (VANCOCIN) IVPB 1000 mg/200 mL premix        1,000 mg 200 mL/hr over 60 Minutes Intravenous On call 09/04/20 1333 09/05/20 1649   08/23/20 1230  sulfamethoxazole-trimethoprim (BACTRIM DS) 800-160 MG per tablet 1 tablet        1 tablet Per Tube Once per day on Mon Wed Fri 08/23/20 1214     08/23/20 0900  sulfamethoxazole-trimethoprim (BACTRIM DS) 800-160 MG per tablet 1  tablet  Status:  Discontinued        1 tablet Oral Once per day on Mon Wed Fri 08/22/20 1429 08/23/20 1214   08/18/20 1330  sulfamethoxazole-trimethoprim (BACTRIM DS) 800-160 MG per tablet 1 tablet  Status:  Discontinued        1 tablet Per Tube Once per day on Mon Wed Fri 08/18/20 1318 08/22/20 1429   08/14/20 1200  cefTRIAXone (ROCEPHIN) 2 g in sodium chloride 0.9 % 100 mL IVPB        2 g 200 mL/hr over 30 Minutes Intravenous Every 24 hours 08/14/20 1035 08/17/20 1657   08/13/20 1400  cefTRIAXone (ROCEPHIN) 2 g in sodium chloride 0.9 % 100 mL IVPB        2 g 200 mL/hr over 30 Minutes Intravenous Every 24 hours 08/13/20 1032 08/13/20 1507   08/09/20 0900  metroNIDAZOLE (FLAGYL) IVPB 500 mg  Status:  Discontinued        500 mg 100 mL/hr over 60 Minutes Intravenous Every 8 hours 08/09/20 0826 08/13/20 1032   08/09/20 0900  vancomycin (VANCOCIN) IVPB 1000 mg/200 mL premix  Status:  Discontinued        1,000 mg 200 mL/hr over 60 Minutes Intravenous Every 12 hours 08/09/20 0845 08/12/20 0835   08/09/20 0400  vancomycin (VANCOCIN) IVPB 1000 mg/200 mL premix  Status:  Discontinued        1,000 mg 200 mL/hr over 60 Minutes Intravenous Every 12 hours 08/08/20 1507 08/08/20 1827   08/08/20 2200  ceFEPIme (MAXIPIME) 2 g in sodium chloride 0.9 % 100 mL IVPB  Status:  Discontinued        2 g 200 mL/hr over 30 Minutes Intravenous Every 8 hours 08/08/20 1345 08/13/20 0946   08/08/20 1500  vancomycin (VANCOREADY) IVPB 750 mg/150 mL        750 mg 150 mL/hr over 60 Minutes Intravenous  Once 08/08/20 1442 08/08/20 1644   08/08/20 1215  aztreonam (AZACTAM) 2 g in sodium chloride 0.9 % 100 mL IVPB        2 g 200 mL/hr over 30 Minutes Intravenous  Once 08/08/20 1209 08/08/20 1344   08/08/20 1215  metroNIDAZOLE (FLAGYL) IVPB 500 mg        500 mg 100 mL/hr over 60 Minutes Intravenous  Once 08/08/20 1209 08/08/20 1452   08/08/20 1215  vancomycin (VANCOCIN) IVPB 1000 mg/200 mL premix        1,000 mg 200  mL/hr over 60 Minutes Intravenous  Once 08/08/20 1209 08/08/20 1452     Objective: Vitals: Today's Vitals   09/05/20 1905 09/05/20 2326 09/06/20 0310 09/06/20 0803  BP: (!) 99/59 103/69 96/63 101/61  Pulse: 72 87 86 88  Resp: '19 16 17 18  ' Temp: (!) 97.5 F (36.4 C) 97.8 F (36.6 C) 98.4 F (36.9 C) 98.2 F (36.8 C)  TempSrc: Axillary Oral Oral Oral  SpO2: 96% 98% 100% 99%  Height:      PainSc: 0-No pain       Intake/Output Summary (Last 24  hours) at 09/06/2020 0826 Last data filed at 09/06/2020 0330 Gross per 24 hour  Intake 901.01 ml  Output 2300 ml  Net -1398.99 ml   Filed Weights   Weight change:   Intake/Output from previous day: 10/19 0701 - 10/20 0700 In: 901 [I.V.:701; IV Piggyback:200] Out: 2300 [Urine:2300] Intake/Output this shift: No intake/output data recorded.  Examination: General exam: Lethargic initially but subsequently woke up, followed some commands , and was speaking with his mother.   HEENT:Oral mucosa moist, Ear/Nose WNL grossly,dentition normal. Respiratory system: bilaterally clear,no wheezing or crackles,no use of accessory muscle, non tender. Cardiovascular system: S1 & S2 +, regular, No JVD. Gastrointestinal system: Abdomen soft, NT,ND, BS+. PEG+, cortrak+. Nervous System:Alert, awake, moving extremities and grossly nonfocal Extremities: No edema, distal peripheral pulses palpable.  Skin: No rashes,no icterus. MSK: Normal muscle bulk,tone, power  Data Reviewed: I have personally reviewed following labs and imaging studies CBC: Recent Labs  Lab 09/01/20 0500 09/01/20 0922 09/01/20 2036 09/02/20 0500 09/03/20 1310 09/05/20 0409 09/06/20 0416  WBC 10.9*  --   --  9.6 9.6 9.7 8.5  HGB 6.5*   < > 8.0* 7.8* 8.2* 7.6* 7.4*  HCT 21.8*   < > 25.6* 25.3* 26.2* 24.9* 23.8*  MCV 86.2  --   --  85.8 86.5 86.8 88.1  PLT 215  --   --  191 176 153 122*   < > = values in this interval not displayed.   Basic Metabolic Panel: Recent Labs    Lab 09/02/20 0500 09/04/20 2120 09/05/20 0409 09/06/20 0416  NA 141 139 138 137  K 4.5 4.0 4.2 4.1  CL 107 106 105 106  CO2 '28 26 26 25  ' GLUCOSE 143* 151* 106* 105*  BUN 31* 28* 29* 23*  CREATININE 0.31* <0.30* <0.30* <0.30*  CALCIUM 7.9* 7.9* 8.0* 7.5*   GFR: CrCl cannot be calculated (This lab value cannot be used to calculate CrCl because it is not a number: <0.30). Liver Function Tests: Recent Labs  Lab 09/04/20 2120  AST 20  ALT 52*  ALKPHOS 71  BILITOT 0.3  PROT 4.1*  ALBUMIN 1.7*   No results for input(s): LIPASE, AMYLASE in the last 168 hours. No results for input(s): AMMONIA in the last 168 hours. Coagulation Profile: Recent Labs  Lab 09/04/20 1333  INR 0.9   Cardiac Enzymes: No results for input(s): CKTOTAL, CKMB, CKMBINDEX, TROPONINI in the last 168 hours. BNP (last 3 results) No results for input(s): PROBNP in the last 8760 hours. HbA1C: No results for input(s): HGBA1C in the last 72 hours. CBG: Recent Labs  Lab 09/05/20 1157 09/05/20 1659 09/05/20 1958 09/05/20 2329 09/06/20 0315  GLUCAP 105* 99 98 103* 105*   Lipid Profile: No results for input(s): CHOL, HDL, LDLCALC, TRIG, CHOLHDL, LDLDIRECT in the last 72 hours. Thyroid Function Tests: No results for input(s): TSH, T4TOTAL, FREET4, T3FREE, THYROIDAB in the last 72 hours. Anemia Panel: No results for input(s): VITAMINB12, FOLATE, FERRITIN, TIBC, IRON, RETICCTPCT in the last 72 hours. Sepsis Labs: Recent Labs  Lab 09/01/20 1715 09/01/20 1815  LATICACIDVEN 1.7 1.7    Recent Results (from the past 240 hour(s))  Culture, blood (routine x 2)     Status: None (Preliminary result)   Collection Time: 09/01/20  4:08 PM   Specimen: BLOOD LEFT HAND  Result Value Ref Range Status   Specimen Description BLOOD LEFT HAND  Final   Special Requests   Final    BOTTLES DRAWN AEROBIC AND ANAEROBIC Blood  Culture adequate volume   Culture   Final    NO GROWTH 4 DAYS Performed at [facility], [place] [address], [place], [place] [address]    Report Status PENDING  Incomplete  Culture, blood (routine x 2)     Status: None (Preliminary result)   Collection Time: 09/01/20  4:16 PM   Specimen: BLOOD  Result Value Ref Range Status   Specimen Description BLOOD LEFT ANTECUBITAL  Final   Special Requests   Final    BOTTLES DRAWN AEROBIC AND ANAEROBIC Blood Culture adequate volume   Culture   Final    NO GROWTH 4 DAYS Performed at [facility], [place] [address], [place], [place] [address]    Report Status PENDING  Incomplete  Culture, Urine     Status: Abnormal   Collection Time: 09/03/20 10:54 AM   Specimen: Urine, Random  Result Value Ref Range Status   Specimen Description URINE, RANDOM  Final   Special Requests   Final    NONE Performed at [facility], [place] [address], [place], [place] [address]    Culture 30,000 COLONIES/mL YEAST (A)  Final   Report Status 09/04/2020 FINAL  Final     Radiology Studies: IR GASTROSTOMY TUBE MOD SED  Result Date: 09/05/2020 CLINICAL DATA:  Seizure, aspiration pneumonia, respiratory failure and need for percutaneous gastrostomy tube for nutrition. History of quadriplegia. EXAM: PERCUTANEOUS GASTROSTOMY TUBE PLACEMENT ANESTHESIA/SEDATION: 1.0 mg IV Versed; 25 mcg IV Fentanyl. Total Moderate Sedation Time 14 minutes. The patient's level of consciousness and physiologic status were continuously monitored during the procedure by Radiology nursing. CONTRAST:  67m OMNIPAQUE IOHEXOL 300 MG/ML  SOLN MEDICATIONS: 1 g IV vancomycin. IV antibiotic was administered in an appropriate time interval prior to needle puncture of the skin. 0.5 mg IV glucagon FLUOROSCOPY TIME:  3.0 minutes.  14.0 mGy. PROCEDURE: The procedure, risks, benefits, and alternatives were explained to the patient's father. Questions regarding the procedure were encouraged and answered. The patient's father understands and consents to the procedure. The evening prior to the  procedure, the patient was given thin liquid barium via a feeding tube in order to opacify the colon. A 5-French catheter was then advanced through the patient's mouth under fluoroscopy into the esophagus and to the level of the stomach. This catheter was used to insufflate the stomach with air under fluoroscopy. The abdominal wall was prepped with chlorhexidine in a sterile fashion, and a sterile drape was applied covering the operative field. A sterile gown and sterile gloves were used for the procedure. Local anesthesia was provided with 1% Lidocaine. A skin incision was made in the upper abdominal wall. Under fluoroscopy, an 18 gauge trocar needle was advanced into the stomach. Contrast injection was performed to confirm intraluminal position of the needle tip. A single T tack was then deployed in the lumen of the stomach. This was brought up to tension at the skin surface. Over a guidewire, a 9-French sheath was advanced into the lumen of the stomach. The wire was left in place as a safety wire. A loop snare device from a percutaneous gastrostomy kit was then advanced into the stomach. A floppy guide wire was advanced through the orogastric catheter under fluoroscopy in the stomach. The loop snare advanced through the percutaneous gastric access was used to snare the guide wire. This allowed withdrawal of the loop snare out of the patient's mouth by retraction of the orogastric catheter and wire. A 20-French bumper retention  gastrostomy tube was looped around the snare device. It was then pulled back through the patient's mouth. The retention bumper was brought up to the anterior gastric wall. The T tack suture was cut at the skin. The exiting gastrostomy tube was cut to appropriate length and a feeding adapter applied. The catheter was injected with contrast material to confirm position and a fluoroscopic spot image saved. The tube was then flushed with saline. A dressing was applied over the gastrostomy exit  site. COMPLICATIONS: None. FINDINGS: Initial fluoroscopy demonstrates adequate opacification of the colon by ingested barium in order to prevent colonic injury during the procedure. The stomach distended well with air allowing safe placement of the gastrostomy tube. After placement, the tip of the gastrostomy tube lies in the body of the stomach. IMPRESSION: Percutaneous gastrostomy with placement of a 20-French bumper retention tube in the body of the stomach. This tube can be used for percutaneous feeds beginning in 24 hours after placement. Electronically Signed   By: Aletta Edouard M.D.   On: 09/05/2020 16:21   DG CHEST PORT 1 VIEW  Result Date: 09/04/2020 CLINICAL DATA:  History of airway aspiration EXAM: PORTABLE CHEST 1 VIEW COMPARISON:  08/20/2020 FINDINGS: Right PICC line remains in place, unchanged. Interval removal of endotracheal tube. OG tube enters the stomach. Heart is normal size. Bilateral perihilar and lower lobe opacities. No effusions or pneumothorax. No acute bony abnormality. IMPRESSION: Perihilar and lower lobe airspace opacities could reflect edema or infection. Electronically Signed   By: Rolm Baptise M.D.   On: 09/04/2020 17:50     LOS: 29 days   Antonieta Pert, MD [facility]  09/06/2020, 8:26 AM

## 2020-09-06 NOTE — Progress Notes (Signed)
Orthopedic Tech Progress Note Patient Details:  LEVOY GEISEN 01-20-1976 [id number] OT called requesting I apply a VELCRO WHO to patient for hand drop on the right side. Ortho Devices Type of Ortho Device: Velcro wrist splint Ortho Device/Splint Location: RUE Ortho Device/Splint Interventions: Ordered, Application, Adjustment   Post Interventions Patient Tolerated: Well Instructions Provided: Care of device   Janit Pagan 09/06/2020, 1:58 PM

## 2020-09-06 NOTE — Progress Notes (Signed)
  Speech Language Pathology Treatment: Dysphagia  Patient Details Name: Cameron Hale [name]: [id number] DOB: 01/04/1976 Today's Date: 09/06/2020 Time: 4158-3094 SLP Time Calculation (min) (ACUTE ONLY): 14 min  Assessment / Plan / Recommendation Clinical Impression  Pt was given ice chips after oral care with concern for aspiration still present. He seems a little slower today in clearing his mouth, with more time until hyoid movement is felt to palpation that is concerning for more sluggish swallowing overall. His mom says that he has been tired today and that he also just finished working with OT, so this could likely be contributing. He has a delayed but very weak cough that is not productive. SLP provided cues for effortful swallows both with the ice chips and without, but today pt is not able to perform a dry swallow despite efforts. SLP deferred other swallowing exercises until pt is more alert. Will continue to follow.    HPI HPI: Cameron Hale is a 44 y.o. male with history of neurofibromatosis type II with extensive cervical neuromas-underwent surgery March 2021 and unfortunately complicated by postoperative infection and quadriplegia, seizures on keppra, blind OS w/ impaired eye movements, presenting to [facility] with altered mental status. Developed hypoglycemia, hypotension and increased WOB. Presents with acute hypoxic respiratory failure due to acute encephalopathy, acute strokes, chronic weakness from quadriplegia and neurofibromatosis, and acute medication induced pneumonitis. Pt intubated 9/25-10/4.  FEES 10/5-  severe, diffuse weakness across oral and pharyngeal musculature leading to delayed oral transit, spillage to pyriforms prior to the swallow and then severe residue after the swallow. Pt has bilateral errythema and indentations on posterior portion of the vocal folds. There is gradual silent aspiration of residue via the posterior commissure.       SLP Plan   Continue with current plan of care       Recommendations  Diet recommendations: NPO;Other(comment) (4-5 ice chips 3x/day after oral care) Medication Administration: Via alternative means                Oral Care Recommendations: Oral care QID Follow up Recommendations: Home health SLP;24 hour supervision/assistance SLP Visit Diagnosis: Dysphagia, oropharyngeal phase (R13.12) Plan: Continue with current plan of care       GO                [name], M.A. [facility] Pager [phone] Office [phone]  09/06/2020, 1:51 PM

## 2020-09-06 NOTE — Progress Notes (Signed)
[facility] Progress Note  KIMARI COUDRIET 44 y.o. May 07, 1976  CC: Anemia  Subjective: Patient sleeping, did not arouse to verbal stimulus nor touch.  ROS : Unable to be obtained due to patient status.   Objective: Vital signs in last 24 hours: Vitals:   09/06/20 1131 09/06/20 1521  BP: 101/65 108/67  Pulse: 85 85  Resp: 16 12  Temp: 98.1 F (36.7 C) 98.1 F (36.7 C)  SpO2: 100% 94%    Physical Exam:  General:  Somnolent, does not awaken to verbal commands nor touch  Head:  Normocephalic, without obvious abnormality, atraumatic  Eyes:  Anicteric sclera, mild conjunctival pallor   Lungs:   Clear to auscultation bilaterally, respirations unlabored  Heart:  Regular rate and rhythm, S1, S2 normal  Abdomen:   Soft, non-tender (no grimace), non-distended, bowel sounds active all four quadrants  Extremities: Extremities normal, atraumatic, no  edema  Pulses: 2+ and symmetric    Lab Results: Recent Labs    09/05/20 0409 09/06/20 0416  NA 138 137  K 4.2 4.1  CL 105 106  CO2 26 25  GLUCOSE 106* 105*  BUN 29* 23*  CREATININE <0.30* <0.30*  CALCIUM 8.0* 7.5*   Recent Labs    09/04/20 2120  AST 20  ALT 52*  ALKPHOS 71  BILITOT 0.3  PROT 4.1*  ALBUMIN 1.7*   Recent Labs    09/05/20 0409 09/06/20 0416  WBC 9.7 8.5  HGB 7.6* 7.4*  HCT 24.9* 23.8*  MCV 86.8 88.1  PLT 153 122*   Recent Labs    09/04/20 1333  LABPROT 12.0  INR 0.9    Assessment/Plan: Anemia and heme-positive stools: -Hgb 7.4, stable as compared to 7.6 yesterday-FOBT positive stool 09/04/20, no frank melena or hematochezia -Normal iron studies 08/18/20 -BUN remains elevated out of proportion to Cr but is improving. BUN 23 with Cr <0.30.  Type 2 neurofibromatosis, quadriplegia, blindness  Seizures  Recent CVA, on 81 mg ASA qd  Plan: Continue Protonix 40 mg IV twice daily, consider transitioning to 40 mg BID via G tube tomorrow.  Continue to monitor H&H with  transfusion as needed to maintain hemoglobin greater than 7.  [facility] will sign off.  Thank you for the consultation.  Please contact us if we can be of any further assistance during his hospital stay.  Salley Slaughter PA-C 09/06/2020, 3:32 PM  Contact #  [phone]

## 2020-09-06 NOTE — Progress Notes (Signed)
Occupational Therapy Treatment Patient Details Name: Cameron Hale [name]: [id number] DOB: 02-01-1976 Today's Date: 09/06/2020    History of present illness 44 yo admitted 9/21 with unresponsiveness and seizures. Intubated 9/25-10/4. MRI with bilcerebral frontal infarcts. PMhx: neurofibromatosis with neurosurgical intervention march 2021 with resultant quadriplegia, seizure disorder,left eye blindness, hearing loss.  He was on CIR 6/23-7/16/2021   OT comments  Pt initially lethargic and difficult to arouse, but was aroused, was able to participate in UE exercise.  He demonstrates 1/5-2/5 strength Rt UE and 1/5-3-/5 on the Lt UE.   Mother reports the Rt UE was his strong side PTA.  He also now has wrist drop on his Rt wrist which is new per mother's report.  AAROM performed bil. UEs, en encouraged mother to perform AAROM several times/day.   Recommend wrist splint for Rt UE - MD contacted.    Follow Up Recommendations  Home health OT;Supervision/Assistance - 24 hour    Equipment Recommendations  None recommended by OT    Recommendations for Other Services      Precautions / Restrictions Precautions Precautions: Other (comment) Precaution Comments: quadriplegia       Mobility Bed Mobility                  Transfers                      Balance                                           ADL either performed or assessed with clinical judgement   ADL                                               Vision       Perception     Praxis      Cognition Arousal/Alertness: Lethargic;Awake/alert Behavior During Therapy: WFL for tasks assessed/performed;Flat affect Overall Cognitive Status: History of cognitive impairments - at baseline                                 General Comments: Pt initially somnolent and difficult to arouse, but with repeated attempts, he was aroused and able to interact.  Mother indicates  once he was awake he was at baseline         Exercises Exercises: General Upper Extremity General Exercises - Upper Extremity Shoulder Flexion: AAROM;Right;Left;10 reps;Supine Shoulder Extension: AAROM;Right;Left;10 reps;Supine Elbow Flexion: AAROM;Right;Left;10 reps;Supine Elbow Extension: AAROM;Right;Left;10 reps;Supine Wrist Flexion: AAROM;Right;Left;10 reps;Supine Wrist Extension: AAROM;AROM;Left;Right;10 reps;Supine Digit Composite Flexion: AAROM;Right;Left;10 reps;Supine Composite Extension: AAROM;Right;Left;10 reps;Supine Other Exercises Other Exercises: mother encouraged to peform PROM/AAROM with pt and reports they were doing this at home    Shoulder Instructions       General Comments      Pertinent Vitals/ Pain       Pain Assessment: Faces (Simultaneous filing. User may not have seen previous data.) Faces Pain Scale: No hurt  Home Living  Prior Functioning/Environment              Frequency  Min 2X/week        Progress Toward Goals  OT Goals(current goals can now be found in the care plan section)  Progress towards OT goals: Progressing toward goals     Plan Discharge plan remains appropriate    Co-evaluation                 AM-PAC OT "6 Clicks" Daily Activity     Outcome Measure   Help from another person eating meals?: Total Help from another person taking care of personal grooming?: Total Help from another person toileting, which includes using toliet, bedpan, or urinal?: Total Help from another person bathing (including washing, rinsing, drying)?: Total Help from another person to put on and taking off regular upper body clothing?: Total Help from another person to put on and taking off regular lower body clothing?: Total 6 Click Score: 6    End of Session    OT Visit Diagnosis: Muscle weakness (generalized) (M62.81);Cognitive communication deficit (R41.841)   Activity  Tolerance Patient tolerated treatment well   Patient Left in bed;with call bell/phone within reach;with family/visitor present   Nurse Communication          Time: 2883-3744 OT Time Calculation (min): 34 min  Charges: OT General Charges $OT Visit: 1 Visit OT Treatments $Therapeutic Activity: 8-22 mins $Therapeutic Exercise: 8-22 mins  Cameron Hale., OTR/L Acute Rehabilitation Services Pager [phone] Office [phone]    Cameron Hale 09/06/2020, 1:59 PM

## 2020-09-07 DIAGNOSIS — R569 Unspecified convulsions: Secondary | ICD-10-CM | POA: Diagnosis not present

## 2020-09-07 LAB — BASIC METABOLIC PANEL
Anion gap: 8 (ref 5–15)
BUN: 25 mg/dL — ABNORMAL HIGH (ref 6–20)
CO2: 26 mmol/L (ref 22–32)
Calcium: 7.8 mg/dL — ABNORMAL LOW (ref 8.9–10.3)
Chloride: 104 mmol/L (ref 98–111)
Creatinine, Ser: <0.3 mg/dL — ABNORMAL LOW (ref 0.61–1.24)
Glucose, Bld: 128 mg/dL — ABNORMAL HIGH (ref 70–99)
Potassium: 4 mmol/L (ref 3.5–5.1)
Sodium: 138 mmol/L (ref 135–145)

## 2020-09-07 LAB — GLUCOSE, CAPILLARY
Glucose-Capillary: 117 mg/dL — ABNORMAL HIGH (ref 70–99)
Glucose-Capillary: 124 mg/dL — ABNORMAL HIGH (ref 70–99)
Glucose-Capillary: 133 mg/dL — ABNORMAL HIGH (ref 70–99)
Glucose-Capillary: 134 mg/dL — ABNORMAL HIGH (ref 70–99)
Glucose-Capillary: 150 mg/dL — ABNORMAL HIGH (ref 70–99)
Glucose-Capillary: 169 mg/dL — ABNORMAL HIGH (ref 70–99)

## 2020-09-07 LAB — CBC
HCT: 24 % — ABNORMAL LOW (ref 39.0–52.0)
Hemoglobin: 7.4 g/dL — ABNORMAL LOW (ref 13.0–17.0)
MCH: 27.1 pg (ref 26.0–34.0)
MCHC: 30.8 g/dL (ref 30.0–36.0)
MCV: 87.9 fL (ref 80.0–100.0)
Platelets: 118 10*3/uL — ABNORMAL LOW (ref 150–400)
RBC: 2.73 MIL/uL — ABNORMAL LOW (ref 4.22–5.81)
RDW: 19.6 % — ABNORMAL HIGH (ref 11.5–15.5)
WBC: 9.1 10*3/uL (ref 4.0–10.5)
nRBC: 0 % (ref 0.0–0.2)

## 2020-09-07 MED ORDER — LEVETIRACETAM 100 MG/ML PO SOLN
1500.0000 mg | Freq: Two times a day (BID) | ORAL | Status: DC
  Administered 2020-09-07 – 2020-09-08 (×2): 1500 mg
  Filled 2020-09-07 (×2): qty 15

## 2020-09-07 MED ORDER — FREE WATER
200.0000 mL | Freq: Four times a day (QID) | Status: DC
  Administered 2020-09-07: 200 mL

## 2020-09-07 MED ORDER — PANTOPRAZOLE SODIUM 40 MG PO PACK
40.0000 mg | PACK | Freq: Every day | ORAL | Status: DC
  Administered 2020-09-07: 40 mg
  Filled 2020-09-07: qty 20

## 2020-09-07 MED ORDER — PROSOURCE TF PO LIQD
45.0000 mL | Freq: Every day | ORAL | Status: DC
  Administered 2020-09-08: 45 mL
  Filled 2020-09-07: qty 45

## 2020-09-07 MED ORDER — FERROUS SULFATE 300 (60 FE) MG/5ML PO SYRP
300.0000 mg | ORAL_SOLUTION | Freq: Every day | ORAL | Status: DC
  Administered 2020-09-08: 300 mg
  Filled 2020-09-07 (×2): qty 5

## 2020-09-07 MED ORDER — OSMOLITE 1.5 CAL PO LIQD
360.0000 mL | Freq: Four times a day (QID) | ORAL | Status: DC
  Administered 2020-09-07 – 2020-09-08 (×3): 360 mL
  Filled 2020-09-07 (×6): qty 474

## 2020-09-07 NOTE — Progress Notes (Addendum)
Occupational Therapy Treatment Patient Details Name: Cameron Hale [name]: [id number] DOB: December 21, 1975 Today's Date: 09/07/2020    History of present illness 43 yo admitted 9/21 with unresponsiveness and seizures. Intubated 9/25-10/4. MRI with bilcerebral frontal infarcts. PMhx: neurofibromatosis with neurosurgical intervention march 2021 with resultant quadriplegia, seizure disorder,left eye blindness, hearing loss.  He was on CIR 6/23-7/16/2021   OT comments  Pt reports splint fitting well.  Mother was instructed how to don/doff and how to inspect his skin for signs of pressure.  Written instruction provided, and she was able to teach back info.   Follow Up Recommendations  Home health OT;Supervision/Assistance - 24 hour    Equipment Recommendations  None recommended by OT    Recommendations for Other Services      Precautions / Restrictions Precautions Precautions: Other (comment) Precaution Comments: quadriplegia Restrictions Weight Bearing Restrictions: No       Mobility Bed Mobility                  Transfers                      Balance                                           ADL either performed or assessed with clinical judgement   ADL                                               Vision       Perception     Praxis      Cognition Arousal/Alertness: Awake/alert Behavior During Therapy: WFL for tasks assessed/performed Overall Cognitive Status: History of cognitive impairments - at baseline                                          Exercises Exercises: Other exercises Other Exercises Other Exercises: Pt reports splint has been fitting well.  Mother present and was instructed how to don/doff and was able to return demonstration with supervision.  She was instructed how to monitor his skin for signs of pressure - written instruction provided    Shoulder Instructions        General Comments      Pertinent Vitals/ Pain       Pain Assessment: Faces Pain Score: 0-No pain  Home Living                                          Prior Functioning/Environment              Frequency  Min 2X/week        Progress Toward Goals  OT Goals(current goals can now be found in the care plan section)  Progress towards OT goals: Progressing toward goals     Plan Discharge plan remains appropriate    Co-evaluation                 AM-PAC OT "6 Clicks" Daily Activity     Outcome Measure   Help from another person  eating meals?: Total Help from another person taking care of personal grooming?: Total Help from another person toileting, which includes using toliet, bedpan, or urinal?: Total Help from another person bathing (including washing, rinsing, drying)?: Total Help from another person to put on and taking off regular upper body clothing?: Total Help from another person to put on and taking off regular lower body clothing?: Total 6 Click Score: 6    End of Session    OT Visit Diagnosis: Muscle weakness (generalized) (M62.81);Cognitive communication deficit (R41.841)   Activity Tolerance Patient tolerated treatment well   Patient Left in bed;with call bell/phone within reach;with family/visitor present   Nurse Communication          Time:  -   09/07/20 1600   OT Time Calculation  OT Start Time (ACUTE ONLY) 1226  OT Stop Time (ACUTE ONLY) 1240  OT Time Calculation (min) 14 min  OT General Charges  $OT Visit 1 Visit  OT Treatments  $Orthotics Fit/Training 8-22 mins       Charges:    Nilsa Nutting OTR/L Acute Rehabilitation Services Pager [phone] Office [phone]    Lucille Passy M 09/07/2020, 4:25 PM

## 2020-09-07 NOTE — Progress Notes (Signed)
This chaplain is present with notary and witnesses for the Pt. to sign his HCPOA.  The Pt. mother-Cameron Hale is bedside.  The Pt. is alert and oriented. The Pt. stated he has no questions about the role of HCPOA.  The Pt. verbalized his choice of Pt. mother-Cameron Hale Lee as his Healthcare agent in the presence of the notary. The document states the Pt. step father-[name], [name] will serve as Healthcare Agent if [name] is unable or unwilling to serve.    The Pt. was given the original HCPOA document and 2 copies.  A copy of HCPOA was scanned into the Pt. electronic medical records.  F/U spiritual care is available as needed.

## 2020-09-07 NOTE — Progress Notes (Signed)

## 2020-09-07 NOTE — Progress Notes (Signed)
Nutrition Follow-up  DOCUMENTATION CODES:   Not applicable  INTERVENTION:  Stop continuous tube feeds in anticipation for transition to bolus tube feeds.  At 1800, initiate bolus tube feed via PEG using Osmolite 1.5 formula at starting volume of 120 ml (half container/ARC) and increase volume by 120 ml at each feeding until goal volume of 360 ml (1.5 containers/ARC) given 4 times daily is met.   Provide 45 ml Prosource TF (or equivalent) once daily per tube.   Free water flushes of 200 ml QID given in between bolus feeds per tube.  Tube feeding regimen to provide 2200 kcal, 101 grams of protein, and 1894 ml of free water.   NUTRITION DIAGNOSIS:   Increased nutrient needs related to wound healing as evidenced by estimated needs; ongoing  GOAL:   Patient will meet greater than or equal to 90% of their needs; met with TF  MONITOR:   TF tolerance, Skin, Weight trends, Labs, I & O's  REASON FOR ASSESSMENT:   Ventilator, Consult Enteral/tube feeding initiation and management, Assessment of nutrition requirement/status  ASSESSMENT:   44 year old male who presented to the ED on 9/21 with AMS. 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 Pt admitted with sepsis, left upper lobe aspiration pneumonia   09/25 - intubated, chest tube insertion 09/30 - chest tube removed 10/04 - extubated 10/06 - Cortrak NGT placed, tip of tube in stomach 10/19 - PEG placed  Pt has been tolerating his continuous tube feeds via PEG tube. RD consulted to transition pt to bolus tube feeds for home regimen. RD to modify tube feeding orders.    Labs and medications reviewed.   Diet Order:   Diet Order            Diet NPO time specified  Diet effective now                 EDUCATION NEEDS:   No education needs have been identified at this time  Skin:  Skin Assessment: Skin Integrity Issues: Skin  Integrity Issues:: Unstageable Unstageable: sacrum  Last BM:  10/21  Height:   Ht Readings from Last 1 Encounters:  08/12/20 '5\' 10"'  (1.778 m)    Weight:    Wt Readings from Last 1 Encounters:  05/30/20 65.6 kg    Ideal Body Weight:  68 kg (adjusted for quadriplegia)  BMI:  Body mass index is 22.9 kg/m.  Estimated Nutritional Needs:   Kcal:  2000-2200  Protein:  100-120 grams  Fluid:  >/= 1.8 L  Corrin Parker, MS, RD, LDN RD pager number/after hours weekend pager number on Amion.

## 2020-09-07 NOTE — Progress Notes (Signed)
Showed pt mother how to administer medications via peg tube. She demonstrated understanding and expressed relief at ease of use of tube and administration of medications. Will continue educating mother on tube use.

## 2020-09-07 NOTE — Progress Notes (Signed)
PROGRESS NOTE    Cameron Hale  TKZ:[id number] DOB: [birth year] DOA: 08/08/2020 PCP: Alroy Dust, L.Marlou Sa, MD   Chief Complaint  Patient presents with  . Hypotension    Brief Narrative: 44 y.o. malewith medical history significant oftype 2 neurofibromatosis and seizures.Patient was noted to have a generalized seizure prior to admission, consistent in blank staringand beingunresponsive.Through the course of the day he returned to his baseline. The patient subsequently became much more somnolent, lethargic, and unresponsive. Patient subsequently transported to [facility] via EMS. On route he had persistent hypotension and he was brought to [facility] emergency department. Family indicates patient had neurosurgery intervention in March 2021, post surgery he developed functional quadriplegia, blindness and hearing loss,the onlyfullneurologic function preserved is talking. He was discharged August 2021 home with his parents. He needs assistance with this basic activities of daily living including bathing and eating. He does have a decubitus pressure ulcer stage II. He was diagnosed with urinary tract infection about 7 days ago and he had ciprofloxacin prescribed.In the ED patient noted to be unresponsive, his CT head with advanced meningiomatosis, unchanged vasogenic edema. No acute bleeding. Arterial blood gas analysis with normal pH. Chest radiograph with signs of aspiration pneumonia. [facility] was contacted, unfortunately unable to perform ED to ED transfer. Patient received intravenous fluids, dexamethasone, 2 g of Keppra,2 mg IV lorazepam, aztreonam,vancomycin, cefepime and metronidazole. Referred for admission forfurtherevaluation 9/25 worsening acute hypoxia, patient subsequently intubated,right chest tube x2 9/30 chest tubes were removed 10/4 extubated  10/5 weaned off oxygen, currently on RA 10/6 coretrak placed 10/7 weaned off levophed; mild hypothermia  overnight - improving with bairhugger 10/8 stable, continue speech evaluation family requesting reevaluation and consideration for advancement of diet as they are hesitant to place PEG tube 10/19: IR PEG plascement, GI consulted for FOBT+ stool and anemia 10/21: Tolerating tube feeding.  GI d not planning for any kind of procedure at this time and signed off. Patient is alert awake conversant  Subjective: Patient is alert awake, conversant, at baseline.  Mother at the bedside. On continuous tube feed. Mother anxious about his care upon discharge has lot of questions about different management medication management.   Assessment & Plan:  Acute respiratory failure with hypoxia: Initially needing mechanical ventilation in ICU, extubated 10/4.  Continue supplemental oxygen wean as tolerated.  Overall doing well from respiratory standpoint.  Continue pulmonary hygiene.  He is now on tube feeding.  Acute on chronic anemia/FOBT+ stool: Seen by [facility] and given his overall frailty no plan for any kind of invasive procedure unless patient is needing multiple blood transfusion and would endoscopic evaluation as last resort and advised supportive measures PPI daily, iron supplementation.  Hemoglobin remained stable without significant drop.Hemoglobin dropped to 6.5 on 10/15 given 1 unit PRBC.  GI signed off.  Explained to patient's mother and she has verbalized understanding.  He will need close follow-up with CBC upon discharge. Recent Labs  Lab 09/02/20 0500 09/03/20 1310 09/05/20 0409 09/06/20 0416 09/07/20 0445  HGB 7.8* 8.2* 7.6* 7.4* 7.4*  HCT 25.3* 26.2* 24.9* 23.8* 24.0*   Pneumonitis: Drug induced secondary to previous monoclonal antibody treatment for neurofibromatosis as outpatient.  On prednisone 40 mg and plan for slow taper over 6 weeks, also with PJP prophylaxis with Bactrim until prednisone dose is less than 20 mg daily.  Acute ischemic infarcts: Earlier on admission with bilateral  frontal infarcts diagnosed 9/26 by neurology, placed on aspirin 81 mg daily.  Seizure disorder change Keppra liquid  via PEG and educate mother.  Acute on chronic dysphagia: Failed speech eval, underwent PEG placement by IR 10/19 , tolerating tube feed, dietitian reconsulted for possible bolus feeding at home versus continuous tube feeding.  Nursing to educate mother about tube feeding and medication use from PEG tube.  Home health order initiated patient family request advanced home care.  Acute metabolic encephalopathy/Lethargy: More lethargic after the procedure but was waking up and was communicating with his mother.  Continue supportive care, minimize sedation.  EEG showed no evidence of seizure he was back to baseline prior to PEG-hopefully will perk up.  Continue supportive measures.  Acute urine retention: Completed fluconazole.  Follow-up voiding trial, was referred for urology follow-up for recurrent UTIs on outpatient basis  Hypothermia: Resolved after few hours of bair hugger.  Blood cultures are currently no growth to date.  Hyponatremia/hypokalemia/hypomagnesemia: Resolved.  preDiabetes : Diet controlled.  Labile blood pressure patient  was started on midodrine and Bp stable so it is discontinued.  Neurofibromatosis type II: Progressive.  Followed up at [facility].  Currently on Brigatinib therapy and is on hold.  Left arm swelling negative for DVT in duplex  GOC:He is followed by [facility] palliative care services in the community    Nutrition: Diet Order            Diet NPO time specified  Diet effective now                 Nutrition Problem: Increased nutrient needs Etiology: wound healing Signs/Symptoms: estimated needs Interventions: Tube feeding  Body mass index is 22.9 kg/m.  Pressure Ulcer: Pressure Injury 08/09/20 Sacrum Unstageable - Full thickness tissue loss in which the base of the injury is covered by slough (yellow, tan,  gray, green or brown) and/or eschar (tan, brown or black) in the wound bed. (Active)  08/09/20 2340  Location: Sacrum  Location Orientation:   Staging: Unstageable - Full thickness tissue loss in which the base of the injury is covered by slough (yellow, tan, gray, green or brown) and/or eschar (tan, brown or black) in the wound bed.  Wound Description (Comments):   Present on Admission: Yes    DVT prophylaxis: Place and maintain sequential compression device Start: 08/11/20 0818 SCDs Start: 08/08/20 1828 Code Status:   Code Status: Full Code  Family Communication: plan of care discussed with patient's mother at bedside.  Status is: Inpatient Remains inpatient appropriate because:Inpatient level of care appropriate due to severity of illness  Dispo:  Patient From: Home  Planned Disposition: Home with Health Care Svc  Expected discharge date: 09/08/20  Medically stable for discharge: No  Consultants: pccm, palliative medicine, [facility] Procedures:see note  Culture/Microbiology    Component Value Date/Time   SDES URINE, RANDOM 09/03/2020 1054   [place]  09/03/2020 1054    NONE Performed at [facility], [place] [address] [place], [place] [address]    CULT 30,000 COLONIES/mL YEAST (A) 09/03/2020 1054   REPTSTATUS 09/04/2020 FINAL 09/03/2020 1054    Other culture-see note  Medications: Scheduled Meds: . aspirin  81 mg Per Tube Daily  . chlorhexidine  15 mL Mouth Rinse BID  . Chlorhexidine Gluconate Cloth  6 each Topical Daily  . collagenase   Topical BID  . feeding supplement (PROSource TF)  45 mL Per Tube TID  . [START ON 09/08/2020] ferrous sulfate  300 mg Per Tube Q breakfast  . free water  100 mL Per Tube Q8H  .  glycopyrrolate  0.1 mg Intravenous TID  . insulin aspart  0-9 Units Subcutaneous Q4H  . levETIRAcetam  1,500 mg Per Tube BID  . pantoprazole sodium  40 mg Per Tube Daily  . predniSONE  40 mg Per Tube Q breakfast  . sodium chloride flush  10-40 mL  Intracatheter Q12H  . sulfamethoxazole-trimethoprim  1 tablet Per Tube Once per day on Mon Wed Fri   Continuous Infusions: . feeding supplement (OSMOLITE 1.5 CAL) 1,000 mL (09/05/20 2250)    Antimicrobials: Anti-infectives (From admission, onward)   Start     Dose/Rate Route Frequency Ordered Stop   09/05/20 0800  vancomycin (VANCOCIN) IVPB 1000 mg/200 mL premix        1,000 mg 200 mL/hr over 60 Minutes Intravenous On call 09/04/20 1333 09/05/20 1649   08/23/20 1230  sulfamethoxazole-trimethoprim (BACTRIM DS) 800-160 MG per tablet 1 tablet        1 tablet Per Tube Once per day on Mon Wed Fri 08/23/20 1214     08/23/20 0900  sulfamethoxazole-trimethoprim (BACTRIM DS) 800-160 MG per tablet 1 tablet  Status:  Discontinued        1 tablet Oral Once per day on Mon Wed Fri 08/22/20 1429 08/23/20 1214   08/18/20 1330  sulfamethoxazole-trimethoprim (BACTRIM DS) 800-160 MG per tablet 1 tablet  Status:  Discontinued        1 tablet Per Tube Once per day on Mon Wed Fri 08/18/20 1318 08/22/20 1429   08/14/20 1200  cefTRIAXone (ROCEPHIN) 2 g in sodium chloride 0.9 % 100 mL IVPB        2 g 200 mL/hr over 30 Minutes Intravenous Every 24 hours 08/14/20 1035 08/17/20 1657   08/13/20 1400  cefTRIAXone (ROCEPHIN) 2 g in sodium chloride 0.9 % 100 mL IVPB        2 g 200 mL/hr over 30 Minutes Intravenous Every 24 hours 08/13/20 1032 08/13/20 1507   08/09/20 0900  metroNIDAZOLE (FLAGYL) IVPB 500 mg  Status:  Discontinued        500 mg 100 mL/hr over 60 Minutes Intravenous Every 8 hours 08/09/20 0826 08/13/20 1032   08/09/20 0900  vancomycin (VANCOCIN) IVPB 1000 mg/200 mL premix  Status:  Discontinued        1,000 mg 200 mL/hr over 60 Minutes Intravenous Every 12 hours 08/09/20 0845 08/12/20 0835   08/09/20 0400  vancomycin (VANCOCIN) IVPB 1000 mg/200 mL premix  Status:  Discontinued        1,000 mg 200 mL/hr over 60 Minutes Intravenous Every 12 hours 08/08/20 1507 08/08/20 1827   08/08/20 2200  ceFEPIme  (MAXIPIME) 2 g in sodium chloride 0.9 % 100 mL IVPB  Status:  Discontinued        2 g 200 mL/hr over 30 Minutes Intravenous Every 8 hours 08/08/20 1345 08/13/20 0946   08/08/20 1500  vancomycin (VANCOREADY) IVPB 750 mg/150 mL        750 mg 150 mL/hr over 60 Minutes Intravenous  Once 08/08/20 1442 08/08/20 1644   08/08/20 1215  aztreonam (AZACTAM) 2 g in sodium chloride 0.9 % 100 mL IVPB        2 g 200 mL/hr over 30 Minutes Intravenous  Once 08/08/20 1209 08/08/20 1344   08/08/20 1215  metroNIDAZOLE (FLAGYL) IVPB 500 mg        500 mg 100 mL/hr over 60 Minutes Intravenous  Once 08/08/20 1209 08/08/20 1452   08/08/20 1215  vancomycin (VANCOCIN) IVPB 1000 mg/200 mL premix  1,000 mg 200 mL/hr over 60 Minutes Intravenous  Once 08/08/20 1209 08/08/20 1452     Objective: Vitals: Today's Vitals   09/06/20 2330 09/07/20 0335 09/07/20 0819 09/07/20 1117  BP: 105/73 107/78 103/72 95/70  Pulse: 88 83 78 83  Resp: '17 14 14 14  ' Temp: 98.2 F (36.8 C) 97.9 F (36.6 C) 97.8 F (36.6 C) 97.8 F (36.6 C)  TempSrc: Oral Oral Oral Oral  SpO2: 98% 97% 97% 96%  Height:      PainSc:        Intake/Output Summary (Last 24 hours) at 09/07/2020 1119 Last data filed at 09/07/2020 0450 Gross per 24 hour  Intake --  Output 3300 ml  Net -3300 ml   Filed Weights   Weight change:   Intake/Output from previous day: 10/20 0701 - 10/21 0700 In: 0  Out: 3300 [Urine:3300] Intake/Output this shift: No intake/output data recorded.  Examination: General exam: AAO to person, at baseline, NAD, weak appearing. HEENT:Oral mucosa moist, Ear/Nose WNL grossly, dentition normal. Respiratory system: bilaterally clear,no wheezing or crackles,no use of accessory muscle Cardiovascular system: S1 & S2 +, No JVD,. Gastrointestinal system: Abdomen soft, NT,ND, BS+ PEG tube intact without drainage or leaking. Nervous System:Alert, awake, moving extremities and grossly nonfocal Extremities: No edema,  extremities contractures noted, distal peripheral pulses palpable.  Skin: No rashes,no icterus. MSK: Normal muscle bulk,tone, power    Data Reviewed: I have personally reviewed following labs and imaging studies CBC: Recent Labs  Lab 09/02/20 0500 09/03/20 1310 09/05/20 0409 09/06/20 0416 09/07/20 0445  WBC 9.6 9.6 9.7 8.5 9.1  HGB 7.8* 8.2* 7.6* 7.4* 7.4*  HCT 25.3* 26.2* 24.9* 23.8* 24.0*  MCV 85.8 86.5 86.8 88.1 87.9  PLT 191 176 153 122* 073*   Basic Metabolic Panel: Recent Labs  Lab 09/02/20 0500 09/04/20 2120 09/05/20 0409 09/06/20 0416 09/07/20 0445  NA 141 139 138 137 138  K 4.5 4.0 4.2 4.1 4.0  CL 107 106 105 106 104  CO2 '28 26 26 25 26  ' GLUCOSE 143* 151* 106* 105* 128*  BUN 31* 28* 29* 23* 25*  CREATININE 0.31* <0.30* <0.30* <0.30* <0.30*  CALCIUM 7.9* 7.9* 8.0* 7.5* 7.8*   GFR: CrCl cannot be calculated (This lab value cannot be used to calculate CrCl because it is not a number: <0.30). Liver Function Tests: Recent Labs  Lab 09/04/20 2120  AST 20  ALT 52*  ALKPHOS 71  BILITOT 0.3  PROT 4.1*  ALBUMIN 1.7*   No results for input(s): LIPASE, AMYLASE in the last 168 hours. No results for input(s): AMMONIA in the last 168 hours. Coagulation Profile: Recent Labs  Lab 09/04/20 1333  INR 0.9   Cardiac Enzymes: No results for input(s): CKTOTAL, CKMB, CKMBINDEX, TROPONINI in the last 168 hours. BNP (last 3 results) No results for input(s): PROBNP in the last 8760 hours. HbA1C: No results for input(s): HGBA1C in the last 72 hours. CBG: Recent Labs  Lab 09/06/20 1510 09/06/20 2019 09/06/20 2332 09/07/20 0335 09/07/20 0819  GLUCAP 136* 136* 103* 124* 117*   Lipid Profile: No results for input(s): CHOL, HDL, LDLCALC, TRIG, CHOLHDL, LDLDIRECT in the last 72 hours. Thyroid Function Tests: No results for input(s): TSH, T4TOTAL, FREET4, T3FREE, THYROIDAB in the last 72 hours. Anemia Panel: No results for input(s): VITAMINB12, FOLATE, FERRITIN,  TIBC, IRON, RETICCTPCT in the last 72 hours. Sepsis Labs: Recent Labs  Lab 09/01/20 1715 09/01/20 1815  LATICACIDVEN 1.7 1.7    Recent Results (from the past  240 hour(s))  Culture, blood (routine x 2)     Status: None   Collection Time: 09/01/20  4:08 PM   Specimen: BLOOD LEFT HAND  Result Value Ref Range Status   Specimen Description BLOOD LEFT HAND  Final   Special Requests   Final    BOTTLES DRAWN AEROBIC AND ANAEROBIC Blood Culture adequate volume   Culture   Final    NO GROWTH 5 DAYS Performed at [facility], [place] [address], [place], [place] [address]    Report Status 09/06/2020 FINAL  Final  Culture, blood (routine x 2)     Status: None   Collection Time: 09/01/20  4:16 PM   Specimen: BLOOD  Result Value Ref Range Status   Specimen Description BLOOD LEFT ANTECUBITAL  Final   Special Requests   Final    BOTTLES DRAWN AEROBIC AND ANAEROBIC Blood Culture adequate volume   Culture   Final    NO GROWTH 5 DAYS Performed at [facility], [place] [address], [place], [place] [address]    Report Status 09/06/2020 FINAL  Final  Culture, Urine     Status: Abnormal   Collection Time: 09/03/20 10:54 AM   Specimen: Urine, Random  Result Value Ref Range Status   Specimen Description URINE, RANDOM  Final   Special Requests   Final    NONE Performed at [facility], [place] [address] [place], [place] [address]    Culture 30,000 COLONIES/mL YEAST (A)  Final   Report Status 09/04/2020 FINAL  Final     Radiology Studies: IR GASTROSTOMY TUBE MOD SED  Result Date: 09/05/2020 CLINICAL DATA:  Seizure, aspiration pneumonia, respiratory failure and need for percutaneous gastrostomy tube for nutrition. History of quadriplegia. EXAM: PERCUTANEOUS GASTROSTOMY TUBE PLACEMENT ANESTHESIA/SEDATION: 1.0 mg IV Versed; 25 mcg IV Fentanyl. Total Moderate Sedation Time 14 minutes. The patient's level of consciousness and physiologic status were continuously monitored during the  procedure by Radiology nursing. CONTRAST:  94m OMNIPAQUE IOHEXOL 300 MG/ML  SOLN MEDICATIONS: 1 g IV vancomycin. IV antibiotic was administered in an appropriate time interval prior to needle puncture of the skin. 0.5 mg IV glucagon FLUOROSCOPY TIME:  3.0 minutes.  14.0 mGy. PROCEDURE: The procedure, risks, benefits, and alternatives were explained to the patient's father. Questions regarding the procedure were encouraged and answered. The patient's father understands and consents to the procedure. The evening prior to the procedure, the patient was given thin liquid barium via a feeding tube in order to opacify the colon. A 5-French catheter was then advanced through the patient's mouth under fluoroscopy into the esophagus and to the level of the stomach. This catheter was used to insufflate the stomach with air under fluoroscopy. The abdominal wall was prepped with chlorhexidine in a sterile fashion, and a sterile drape was applied covering the operative field. A sterile gown and sterile gloves were used for the procedure. Local anesthesia was provided with 1% Lidocaine. A skin incision was made in the upper abdominal wall. Under fluoroscopy, an 18 gauge trocar needle was advanced into the stomach. Contrast injection was performed to confirm intraluminal position of the needle tip. A single T tack was then deployed in the lumen of the stomach. This was brought up to tension at the skin surface. Over a guidewire, a 9-French sheath was advanced into the lumen of the stomach. The wire was left in place as a safety wire. A loop snare device from a percutaneous gastrostomy kit was then advanced  into the stomach. A floppy guide wire was advanced through the orogastric catheter under fluoroscopy in the stomach. The loop snare advanced through the percutaneous gastric access was used to snare the guide wire. This allowed withdrawal of the loop snare out of the patient's mouth by retraction of the orogastric catheter and  wire. A 20-French bumper retention gastrostomy tube was looped around the snare device. It was then pulled back through the patient's mouth. The retention bumper was brought up to the anterior gastric wall. The T tack suture was cut at the skin. The exiting gastrostomy tube was cut to appropriate length and a feeding adapter applied. The catheter was injected with contrast material to confirm position and a fluoroscopic spot image saved. The tube was then flushed with saline. A dressing was applied over the gastrostomy exit site. COMPLICATIONS: None. FINDINGS: Initial fluoroscopy demonstrates adequate opacification of the colon by ingested barium in order to prevent colonic injury during the procedure. The stomach distended well with air allowing safe placement of the gastrostomy tube. After placement, the tip of the gastrostomy tube lies in the body of the stomach. IMPRESSION: Percutaneous gastrostomy with placement of a 20-French bumper retention tube in the body of the stomach. This tube can be used for percutaneous feeds beginning in 24 hours after placement. Electronically Signed   By: Aletta Edouard M.D.   On: 09/05/2020 16:21     LOS: 30 days   Antonieta Pert, MD [facility]  09/07/2020, 11:19 AM

## 2020-09-07 NOTE — Progress Notes (Addendum)
This chaplain is present for Pt. Cameron Hale education before completing the Pt. Mother-request for HCPOA.  The Pt. Mother-Cameron Hale is bedside. The Pt. is awake, alert, and can follow the chaplain's conversation.  The Pt. agrees to talking about HCPOA with the chaplain.   The Pt. responds to the chaplain's question, what do you know about HCPOA, with "I think you are going to tell me." Education is completed describing the role of the HCPOA.  The Pt. describes the roles of his family in his healthcare with the chaplain. The chaplain references the family members names and numbers in [facility] emergency contacts.   The chaplain references the Pt. wife-Cameron Hale's name in emergency contacts. The chaplain offers the place for the Pt. to reflect on why the Pt. has not mentioned Cameron Hale's name in the conversation. The Pt. describes the relationship with his wife as "distant" and "it has been a long time since we talked."  The chaplain understands the Pt. choice for HCPOA is either the Pt. Mother-Cameron Hale or Pt. Step-father Cameron Hale. The chaplain will F/U this afternoon.

## 2020-09-07 NOTE — Progress Notes (Addendum)
This chaplain was present with Pt. and Pt. mother-[name] for F/U communication about completing the Pt. AD.  The Pt. is awake and responds to the chaplain's review questions about the Pt. HCPOA appropriately. The chaplain asked the Pt. , "What does [name] know?" The Pt. replied, "She knows I want to make a change." The chaplain will return between 2:30-3:00 with a notary and witnesses for signing of Pt. HCPOA.

## 2020-09-08 ENCOUNTER — Telehealth: Payer: Self-pay | Admitting: Surgery

## 2020-09-08 DIAGNOSIS — R569 Unspecified convulsions: Secondary | ICD-10-CM | POA: Diagnosis not present

## 2020-09-08 LAB — CBC
HCT: 24.2 % — ABNORMAL LOW (ref 39.0–52.0)
Hemoglobin: 7.5 g/dL — ABNORMAL LOW (ref 13.0–17.0)
MCH: 27.1 pg (ref 26.0–34.0)
MCHC: 31 g/dL (ref 30.0–36.0)
MCV: 87.4 fL (ref 80.0–100.0)
Platelets: 139 10*3/uL — ABNORMAL LOW (ref 150–400)
RBC: 2.77 MIL/uL — ABNORMAL LOW (ref 4.22–5.81)
RDW: 19.4 % — ABNORMAL HIGH (ref 11.5–15.5)
WBC: 9.6 10*3/uL (ref 4.0–10.5)
nRBC: 0 % (ref 0.0–0.2)

## 2020-09-08 LAB — BASIC METABOLIC PANEL WITH GFR
Anion gap: 7 (ref 5–15)
BUN: 26 mg/dL — ABNORMAL HIGH (ref 6–20)
CO2: 27 mmol/L (ref 22–32)
Calcium: 8 mg/dL — ABNORMAL LOW (ref 8.9–10.3)
Chloride: 104 mmol/L (ref 98–111)
Creatinine, Ser: <0.3 mg/dL — ABNORMAL LOW (ref 0.61–1.24)
Glucose, Bld: 93 mg/dL (ref 70–99)
Potassium: 4.1 mmol/L (ref 3.5–5.1)
Sodium: 138 mmol/L (ref 135–145)

## 2020-09-08 LAB — GLUCOSE, CAPILLARY
Glucose-Capillary: 114 mg/dL — ABNORMAL HIGH (ref 70–99)
Glucose-Capillary: 82 mg/dL (ref 70–99)
Glucose-Capillary: 112 mg/dL — ABNORMAL HIGH (ref 70–99)

## 2020-09-08 MED ORDER — ALUNBRIG 90 & 180 MG PO TBPK: 90.0000 mg | ORAL_TABLET | Freq: Every day | ORAL | Status: AC

## 2020-09-08 MED ORDER — PREDNISONE 20 MG PO TABS: ORAL_TABLET | ORAL | 0 refills | Status: AC

## 2020-09-08 MED ORDER — FERROUS SULFATE 300 (60 FE) MG/5ML PO SYRP: 300.0000 mg | ORAL_SOLUTION | Freq: Every day | ORAL | 3 refills | Status: AC

## 2020-09-08 MED ORDER — DEXAMETHASONE 2 MG PO TABS: 2.0000 mg | ORAL_TABLET | Freq: Every day | ORAL | Status: AC

## 2020-09-08 MED ORDER — LEVETIRACETAM 100 MG/ML PO SOLN: 1500.0000 mg | Freq: Two times a day (BID) | ORAL | 1 refills | Status: AC

## 2020-09-08 MED ORDER — SULFAMETHOXAZOLE-TRIMETHOPRIM 800-160 MG PO TABS: 1.0000 | ORAL_TABLET | ORAL | 0 refills | Status: AC

## 2020-09-08 MED ORDER — PANTOPRAZOLE SODIUM 40 MG PO PACK: 40.0000 mg | PACK | Freq: Every day | ORAL | 1 refills | Status: AC

## 2020-09-08 MED ORDER — PROSOURCE TF PO LIQD: 45.0000 mL | Freq: Every day | ORAL | 1 refills | Status: AC

## 2020-09-08 MED ORDER — ALBUTEROL SULFATE HFA 108 (90 BASE) MCG/ACT IN AERS
1.0000 | INHALATION_SPRAY | Freq: Four times a day (QID) | RESPIRATORY_TRACT | 1 refills | Status: AC | PRN
Start: 2020-09-08 — End: 2020-10-08

## 2020-09-08 MED ORDER — FREE WATER: 200.0000 mL | Freq: Four times a day (QID) | Status: AC

## 2020-09-08 MED ORDER — OSMOLITE 1.5 CAL PO LIQD: 360.0000 mL | Freq: Four times a day (QID) | ORAL | 1 refills | Status: AC

## 2020-09-08 MED ORDER — ASPIRIN 81 MG PO CHEW: 81.0000 mg | CHEWABLE_TABLET | Freq: Every day | ORAL | 0 refills | Status: AC

## 2020-09-08 NOTE — TOC Transition Note (Signed)
Transition of Care (TOC) - CM/SW Discharge Note Marvetta Gibbons RN,BSN Transitions of Care Unit 4NP (non trauma) - RN Case Manager See Treatment Team for direct Phone #   Patient Details  Name: Cameron Hale [name]: [id number] Date of Birth: [birth year]  Transition of Care [facility] CM/SW Contact:  Dawayne Patricia, RN Phone Number: 09/08/2020, 12:27 PM   Clinical Narrative:    Pt stable for transition home today with parents. Was active with [facility] prior to admission for [facility] needs- orders have been placed to resume [place] needs- RN/PT/SLP/aide- mom wants to continue services with [facility]- have notified Erin with [facility] for resumption of [place] services on discharge.  Pt now with PEG tube and plan for bolus TF at home- orders placed for home tube feedings- mom wants to use "advanced" for this as well- Call made to [employer] for home TF needs- they will process and deliver supplies to the home.   Mom/dad to transport home in w/c [place]. [facility]   Final next level of care: [place] Barriers to Discharge: Barriers Resolved   Patient Goals and CMS Choice Patient states their goals for this hospitalization and ongoing recovery are:: Patient's mother wants "everything done and for the patient to keep fighting". CMS Medicare.gov Compare Post Acute Care list provided to:: Patient Represenative (must comment) Choice offered to / list presented to : Parent  Discharge Placement               Home with [facility]        Discharge Plan and Services   Discharge Planning Services: CM Consult Post Acute Care Choice: Home Health          DME Arranged: Tube feeding DME Agency: [employer] Date DME Agency Contacted: 09/08/20 Time DME Agency Contacted: 51 Representative spoke with at DME Agency: [place]: RN, PT, Nurse's Aide, Speech Therapy [place] Agency: [place] ([place]) Date [place]: 09/07/20 Time [place]:  1415 Representative spoke with at [place]: [place] delivered to patient - living at mother's home in [place], [place] Determinants of Health (SDOH) Interventions     Readmission Risk Interventions Readmission Risk Prevention Plan 09/08/2020 08/17/2020  Transportation Screening Complete Complete  Medication Review Press photographer) Complete Complete  PCP or Specialist appointment within 3-5 days of discharge Complete Complete  HRI or Home Care Consult Complete Complete  SW Recovery Care/Counseling Consult Complete Complete  Palliative Care Screening Complete Complete  [place] Not Applicable Complete  Some recent data might be hidden

## 2020-09-08 NOTE — Telephone Encounter (Signed)
ED CM received call from Charge Nurse on 4N concerning a call she received from patient's mother [name] [phone] about [facility] not [facility] until Monday, and patient is on total tube feeding.  CM reviewed chart record and noted [facility] [place]. CM contacted several [facility] but non had it in stock.  CM notified the [facility] on duty, and was able to locate a small supply for a couple of bolus feeds. CM updated patient's mother made arrangements to pick up tube feed. CM will leave a secure message for [facility] RNCM to assist with tube feeding in the am.

## 2020-09-08 NOTE — Discharge Summary (Signed)
Physician Discharge Summary  Cameron Hale ACZ:[id number] DOB: January 31, 1976 DOA: 08/08/2020  PCP: Alroy Dust, L.Marlou Sa, MD  Admit date: 08/08/2020 Discharge date: 09/08/2020  Admitted From: home Disposition:  hom  Recommendations for Outpatient Follow-up:  1. Follow up with PCP in 1-2 weeks 2. Please obtain BMP/CBC in one week 3. Please follow up on the following pending results:  Home Health:Yes  Equipment/Devices: None  Discharge Condition: Stable Code Status:   Code Status: Full Code Diet recommendation:  Diet Order            Diet NPO time specified  Diet effective now                  Brief/Interim Summary:  44 y.o.malewith medical history significant oftype 2 neurofibromatosis and seizures.Patient was noted to have a generalized seizure prior to admission, consistent in blank staringand beingunresponsive.Through the course of the day he returned to his baseline. The patient subsequently became much more somnolent, lethargic, and unresponsive. Patient subsequently transported to [facility] via EMS. On route he had persistent hypotension and he was brought to [facility] emergency department. Family indicates patient had neurosurgery intervention in March 2021, post surgery he developed functional quadriplegia, blindness and hearing loss,the onlyfullneurologic function preserved is talking. He was discharged August 2021 home with his parents. He needs assistance with this basic activities of daily living including bathing and eating. He does have a decubitus pressure ulcer stage II. He was diagnosed with urinary tract infection about 7 days ago and he had ciprofloxacin prescribed.In the ED patient noted to be unresponsive, his CT head with advanced meningiomatosis, unchanged vasogenic edema. No acute bleeding. Arterial blood gas analysis with normal pH. Chest radiograph with signs of aspiration pneumonia. [facility] was contacted, unfortunately unable to  perform ED to ED transfer. Patient received intravenous fluids, dexamethasone, 2 g of Keppra,2 mg IV lorazepam, aztreonam,vancomycin, cefepime and metronidazole. Referred for admission forfurtherevaluation 9/25 worsening acute hypoxia, patient subsequently intubated,right chest tube x2 9/30 chest tubes were removed 10/4 extubated  10/5 weaned off oxygen, currently on RA 10/6 coretrak placed 10/7 weaned off levophed; mild hypothermia overnight - improving with bairhugger 10/8 stable, continue speech evaluation family requesting reevaluation and consideration for advancement of diet as they are hesitant to place PEG tube 10/19: IR PEG plascement, GI consulted for FOBT+ stool and anemia 10/21: Tolerating tube feeding.  GI d not planning for any kind of procedure at this time and signed off. Patient is alert awake conversant. Has been transitioned to bolus tube feeding, mother has been educated. At this time patient is doing well, he is alert, awake and oriented at baseline.  Medically stable for discharge.  Discharge Diagnoses:   Acute respiratory failure with hypoxia: Initially needing mechanical ventilation in ICU, extubated 10/4.  On room air now.  Acute on chronic anemia/FOBT+ stool: Seen by [facility] and given his overall frailty no plan for any kind of invasive procedure unless patient is needing multiple blood transfusion and would endoscopic evaluation as last resort and advised supportive measures PPI daily, iron supplementation. hb dropped to 6.5 on 10/15 given 1 unit PRBC.  Hb stable, GI signed off.  Explained to patient's mother and she has verbalized understanding.  Repeat CBC from PCP soon  Pneumonitis: Drug induced secondary to previous monoclonal antibody treatment for neurofibromatosis as outpatient.  On prednisone 40 mg and plan for slow taper over 6 weeks, also with PJP prophylaxis with Bactrim until prednisone dose is less than 20 mg daily.  Acute ischemic infarcts:  Earlier on admission with bilateral frontal infarcts diagnosed 9/26 by neurology, placed on aspirin 81 mg daily.  Seizure disorder  continue Keppra via PEG tube.    Acute on chronic dysphagia: Failed speech eval, underwent PEG placement by IR 10/19 , tolerating tube feed, dietitian reconsulted for possible bolus feeding at home versus continuous tube feeding.  Nursing to educate mother about tube feeding and medication use from PEG tube.  Home health order initiated patient family request advanced home care.  Acute metabolic encephalopathy/Lethargy: More lethargic after the procedure.  But he is much improved and conversant and back to baseline. minimize sedation.  EEG showed no evidence of seizure.  Acute urine retention: Completed fluconazole.  Foley teen study on 10/17 mother stated that they do intermittent catheterization at home for retention so we will discontinue Foley on discharge, he has follow-up with urology soon.  Hypothermia: Resolved after few hours of bair hugger.  Blood cultures are currently no growth to date.  Hyponatremia/hypokalemia/hypomagnesemia: Labs stable.  preDiabetes : Diet controlled.  Labile blood pressure patient  was started on midodrine and Bp stable so it is discontinued.  Neurofibromatosis type II: Progressive.  Followed up at [facility].  Currently on Brigatinib therapy and is on hold- advised to ask his neruo regarding resuming meds and also decadron..  Left arm swelling negative for DVT in duplex  GOC:He is followed by [facility] palliative care services in the community   Nutrition:    Diet Order                  Diet NPO time specified  Diet effective now                  Nutrition Problem: Increased nutrient needs Etiology: wound healing Signs/Symptoms: estimated needs Interventions: Tube feeding  Bolus Tube Feeding: Osmoite 1.5: 360 ml (1.5 containers/ARC) given 4 times daily  Provide 45 ml  Prosource TF (or equivalent) once daily per tube.  Free water flushes of 200 ml QID given in between bolus feeds per tube. Tube feeding regimen to provide 2200 kcal, 101 grams of protein, and 1894 ml of free water  Pressure Ulcer: Pressure Injury 08/09/20 Sacrum Unstageable - Full thickness tissue loss in which the base of the injury is covered by slough (yellow, tan, gray, green or brown) and/or eschar (tan, brown or black) in the wound bed. (Active)  08/09/20 2340  Location: Sacrum  Location Orientation:   Staging: Unstageable - Full thickness tissue loss in which the base of the injury is covered by slough (yellow, tan, gray, green or brown) and/or eschar (tan, brown or black) in the wound bed.  Wound Description (Comments):   Present on Admission: Yes    Consults:  PCCM, GI, IR-SEE NOTE.  Subjective: Alert, awake, conversant.  Mother at the bedside. Discharge Exam: Vitals:   09/08/20 0339 09/08/20 0752  BP: 129/83 109/72  Pulse: 87 82  Resp: 17 15  Temp: 97.7 F (36.5 C) 98.6 F (37 C)  SpO2: 98% 99%   General: Pt is alert, awake, not in acute distress Cardiovascular: RRR, S1/S2 +, no rubs, no gallops Respiratory: CTA bilaterally, no wheezing, no rhonchi Abdominal: Soft, NT, ND, bowel sounds + Extremities: no edema, no cyanosis  Discharge Instructions  Discharge Instructions    Discharge instructions   Complete by: As directed    CBC BMP check from PCP in 1 week Continue tube feeding as instructed Keep nothing by mouth  Please call call MD or return to ER for similar or worsening recurring problem that brought you to hospital or if any fever,nausea/vomiting,abdominal pain, uncontrolled pain, chest pain,  shortness of breath or any other alarming symptoms.  Please follow-up your doctor as instructed in a week time and call the office for appointment.  Please avoid alcohol, smoking, or any other illicit substance and maintain healthy habits including taking your  regular medications as prescribed.  You were cared for by a hospitalist during your hospital stay. If you have any questions about your discharge medications or the care you received while you were in the hospital after you are discharged, you can call the unit and ask to speak with the hospitalist on call if the hospitalist that took care of you is not available.  Once you are discharged, your primary care physician will handle any further medical issues. Please note that NO REFILLS for any discharge medications will be authorized once you are discharged, as it is imperative that you return to your primary care physician (or establish a relationship with a primary care physician if you do not have one) for your aftercare needs so that they can reassess your need for medications and monitor your lab values   Discharge wound care:   Complete by: As directed    Apply Santyl to sacral wound in a nickel thick layer. Cover with a saline moistened gauze, then dry gauze or ABD pad.  Change twice daily   Increase activity slowly   Complete by: As directed      Allergies as of 09/08/2020      Reactions   Amoxicillin Itching   Morphine Itching      Medication List    STOP taking these medications   acetaminophen 325 MG tablet Commonly known as: TYLENOL   baclofen 10 MG tablet Commonly known as: LIORESAL   bisacodyl 10 MG suppository Commonly known as: DULCOLAX   ciprofloxacin 500 MG tablet Commonly known as: CIPRO   famotidine 20 MG tablet Commonly known as: PEPCID   levETIRAcetam 500 MG tablet Commonly known as: Keppra Replaced by: levETIRAcetam 100 MG/ML solution   propranolol 10 MG tablet Commonly known as: INDERAL     TAKE these medications   albuterol 108 (90 Base) MCG/ACT inhaler Commonly known as: VENTOLIN HFA Inhale 1-2 puffs into the lungs every 6 (six) hours as needed for wheezing or shortness of breath.   Alunbrig 90 & 180 MG Tbpk Generic drug: brigatinib Take  90-180 mg by mouth daily. Check with your physician at [facility] about when to resume this medication What changed: additional instructions   ammonium lactate 12 % lotion Commonly known as: AmLactin Apply 1 application topically as needed for dry skin.   aspirin 81 MG chewable tablet Place 1 tablet (81 mg total) into feeding tube daily.   dexamethasone 2 MG tablet Commonly known as: DECADRON 1 tablet (2 mg total) by Per J Tube route daily. HOLD IT WHILE HE IS GETTING PREDNISONE What changed:   how to take this  additional instructions   feeding supplement (OSMOLITE 1.5 CAL) Liqd Place 360 mLs into feeding tube 4 (four) times daily for 28 days.   feeding supplement (PROSource TF) liquid Place 45 mLs into feeding tube daily.   ferrous sulfate 300 (60 Fe) MG/5ML syrup Place 5 mLs (300 mg total) into feeding tube daily with breakfast.   free water Soln Place 200 mLs into feeding tube 4 (four) times daily.   levETIRAcetam  100 MG/ML solution Commonly known as: KEPPRA Place 15 mLs (1,500 mg total) into feeding tube 2 (two) times daily. Replaces: levETIRAcetam 500 MG tablet   pantoprazole sodium 40 mg/20 mL Pack Commonly known as: PROTONIX Place 20 mLs (40 mg total) into feeding tube at bedtime.   predniSONE 20 MG tablet Commonly known as: DELTASONE Take VIA peg TUBE 4 tabs daily x 3 days,3 tabs daily x 3 days,2 tabs daily x 3 days,1 tab daily x 3 days then stop.   Refresh Tears 0.5 % Soln Generic drug: carboxymethylcellulose Place 1 drop into both eyes daily as needed (For dry eyes).   sulfamethoxazole-trimethoprim 800-160 MG tablet Commonly known as: BACTRIM DS Place 1 tablet into feeding tube 3 (three) times a week for 9 days. Stop after prednisone dose is < 20 mg            Discharge Care Instructions  (From admission, onward)         Start     Ordered   09/08/20 0000  Discharge wound care:       Comments: Apply Santyl to sacral wound in a nickel thick  layer. Cover with a saline moistened gauze, then dry gauze or ABD pad.  Change twice daily   09/08/20 1054          Follow-up Information    [facility] Follow up.   Specialty: [place] Why: HHRN/PT/aide arranged- services to resume        Alroy Dust, L.Marlou Sa, MD Follow up in 1 week(s).   Specialty: Family Medicine Why: For CBC check inm 1 wk. Contact information: [address] [place] [address] [phone]        Pixie Casino, MD .   Specialty: Cardiology Contact information: [address] [place] [address] [phone]              Allergies  Allergen Reactions  . Amoxicillin Itching  . Morphine Itching    The results of significant diagnostics from this hospitalization (including imaging, microbiology, ancillary and laboratory) are listed below for reference.    Microbiology: Recent Results (from the past 240 hour(s))  Culture, blood (routine x 2)     Status: None   Collection Time: 09/01/20  4:08 PM   Specimen: BLOOD LEFT HAND  Result Value Ref Range Status   Specimen Description BLOOD LEFT HAND  Final   Special Requests   Final    BOTTLES DRAWN AEROBIC AND ANAEROBIC Blood Culture adequate volume   Culture   Final    NO GROWTH 5 DAYS Performed at [facility], [address] [place], [place] [address]    Report Status 09/06/2020 FINAL  Final  Culture, blood (routine x 2)     Status: None   Collection Time: 09/01/20  4:16 PM   Specimen: BLOOD  Result Value Ref Range Status   Specimen Description BLOOD LEFT ANTECUBITAL  Final   Special Requests   Final    BOTTLES DRAWN AEROBIC AND ANAEROBIC Blood Culture adequate volume   Culture   Final    NO GROWTH 5 DAYS Performed at [facility], [place] [address], [place], [place] [address]    Report Status 09/06/2020 FINAL  Final  Culture, Urine     Status: Abnormal   Collection Time: 09/03/20 10:54 AM   Specimen: Urine,  Random  Result Value Ref Range Status   Specimen Description URINE, RANDOM  Final  Special Requests   Final    NONE Performed at [facility], [place] [address] [place], [place] [address]    Culture 30,000 COLONIES/mL YEAST (A)  Final   Report Status 09/04/2020 FINAL  Final    Procedures/Studies: EEG  Result Date: 08/12/2020 Lora Havens, MD     08/12/2020  2:56 PM Patient Name: Cameron Hale [name]: [id number] Epilepsy Attending: Lora Havens Referring Physician/Provider: Dr Ina Homes Date: 08/12/2020 Duration: 24.36 mins Patient history: 44 year old man with extensive neurofibromatosis 2 lesions in the brain and C-spine who is quadriplegic after a spine surgery, has a history of seizure disorder with worsening mentation. EEG to evaluate for seizure. Level of alertness:  lethargic AEDs during EEG study: LEV Technical aspects: This EEG study was done with scalp electrodes positioned according to the 10-20 International system of electrode placement. Electrical activity was acquired at a sampling rate of 500Hz and reviewed with a high frequency filter of 70Hz and a low frequency filter of 1Hz. EEG data were recorded continuously and digitally stored. Description: No posterior dominant rhythm was seen. EEG showed continuous generalized 3 to 6 Hz theta-delta slowing admixed with intermittent 8-9hz alpha activity predominantly in posterior head region. Hyperventilation and photic stimulation were not performed.    ABNORMALITY -Continuous slow, generalized  IMPRESSION: This study is suggestive of moderate diffuse encephalopathy, nonspecific etiology. No seizures or epileptiform discharges were seen throughout the recording.  Lora Havens   CT ABDOMEN WO CONTRAST  Result Date: 09/02/2020 CLINICAL DATA:  44 year old male with history of quadriplegia, type 2 neurofibromatosis, and seizures. Evaluate for possible gastrostomy tube placement. EXAM: CT ABDOMEN WITHOUT CONTRAST TECHNIQUE:  Multidetector CT imaging of the abdomen was performed following the standard protocol without IV contrast. COMPARISON:  None. FINDINGS: Lower chest: Small bilateral pleural effusions, right greater than left with associated bibasilar passive atelectasis. Streaky regions of hyperattenuation in the right lung base as could be seen with sequela of aspiration versus calcifications due to chronic atelectasis. Hepatobiliary: The left lobe of the liver is relatively atrophic. The liver is otherwise normal in size, contour, and attenuation. The gallbladder is present and unremarkable. No intra or extrahepatic biliary ductal dilation. Pancreas: Unremarkable. No pancreatic ductal dilatation or surrounding inflammatory changes. Spleen: Normal in size without focal abnormality. Adrenals/Urinary Tract: The adrenal glands are normal in size and morphology bilaterally. The kidneys are symmetric in size bilaterally. About the anterior and superior left capsule is irregular appearing focal calcification measuring approximately 6 mm in maximum diameter of indeterminate etiology, however favored represent sequela of prior trauma or instrumentation versus calcification of prior hemorrhagic cyst. The dome of the bladder is visualized partially. Stomach/Bowel: High-density material in the dependent portion of the stomach, favored represent enteric contrast administered the prior day during swallowing study. Weighted tip enteric tube is in place with the tip terminating in the first portion of the duodenum. The visualized portion of the small bowel is normal in course and caliber. There is residual enteric contrast material within the visualize ascending transverse and descending colon which is normal in appearance. No significant bowel wall thickening or surrounding inflammatory changes. The transverse colon is in close proximity to the gastric body and antrum, however would likely separate with insufflation of the stomach.  Vascular/Lymphatic: No significant vascular findings are present. No enlarged abdominal or pelvic lymph nodes. Other: No abdominal wall hernia or abnormality. Musculoskeletal: Status post posterior spinal instrumented fusion throughout the visualized spine sparing the L2 and S1 vertebral bodies.  No evidence of hardware complication. No acute osseous abnormality. IMPRESSION: 1. Percutaneous gastrostomy tube placement is likely favorable with adequate insufflation of the stomach. Recommend enteric contrast administration 24-48 hours prior to placement for opacification of the overlying transverse colon. 2. No acute abnormality in the abdomen. 3. Small bilateral pleural effusions, right greater than left. Ruthann Cancer, MD [facility] Radiology Electronically Signed   By: Ruthann Cancer MD   On: 09/02/2020 09:18   CT ANGIO HEAD W OR WO CONTRAST  Result Date: 08/14/2020 CLINICAL DATA:  Follow-up examination for stroke. EXAM: CT ANGIOGRAPHY HEAD AND NECK TECHNIQUE: Multidetector CT imaging of the head and neck was performed using the standard protocol during bolus administration of intravenous contrast. Multiplanar CT image reconstructions and MIPs were obtained to evaluate the vascular anatomy. Carotid stenosis measurements (when applicable) are obtained utilizing NASCET criteria, using the distal internal carotid diameter as the denominator. CONTRAST:  40m OMNIPAQUE IOHEXOL 350 MG/ML SOLN COMPARISON:  Prior MRI from 08/13/2020. FINDINGS: CT HEAD FINDINGS Brain: Extensive changes of NF 2 again noted, fully described on prior studies. Recently identified ischemic infarcts better seen on recent brain MRI, grossly stable. No significant mass effect, hemorrhagic transformation, or other complication. No other definite new acute intracranial abnormality. No intracranial hemorrhage. No midline shift or hydrocephalus. No extra-axial fluid collection. Vascular: No hyperdense  vessel. Skull: Stable.  No new finding identified. Sinuses: Partial opacification of the right frontal sinus, with scattered mucosal thickening within the ethmoidal air cells and maxillary sinuses. Left frontal osteoma again noted. Moderate bilateral mastoid effusions. Orbits: Unchanged.  No new or acute finding. Review of the MIP images confirms the above findings CTA NECK FINDINGS Aortic arch: Visualized aortic arch normal in caliber with normal branch pattern. No hemodynamically significant stenosis seen about the origin of the great vessels. Right carotid system: Right common and internal carotid arteries widely patent without stenosis, dissection or occlusion. Left carotid system: Left common and internal carotid arteries widely patent without stenosis, dissection or occlusion. Vertebral arteries: Both vertebral arteries arise from the subclavian arteries. Vertebral arteries widely patent within the neck without stenosis, dissection or occlusion. Skeleton: No visible acute osseous abnormality. Posterior Harrington fixation rods partially visualized at the upper thoracic spine. Osseous structures are diffusely heterogeneous in appearance without definite discrete lytic or blastic osseous lesions. Other neck: Endotracheal and enteric tubes in place. No other acute soft tissue abnormality within the neck. Upper chest: Multifocal parenchymal opacity noted within the visualized lungs, most pronounced at the posterior right upper lobe, concerning for multifocal infection. Large heterogeneous layering left pleural effusion partially visualized. Right central venous catheter in place. Review of the MIP images confirms the above findings CTA HEAD FINDINGS Anterior circulation: Petrous segments patent bilaterally. Mild smooth narrowing at the para clinoid right ICA. Both internal carotid arteries otherwise widely patent to the termini. A1 segments patent bilaterally. Normal anterior communicating artery complex. Anterior  cerebral arteries are markedly diminutive but patent to their distal aspects without stenosis or occlusion. Similarly, M1 segments are diminutive but patent as well. Negative MCA bifurcations. Distal MCA branches well perfused bilaterally. Posterior circulation: Diminutive vertebral arteries patent to the vertebrobasilar junction without stenosis. Left vertebral artery slightly dominant. Patent right PICA. Left PICA not seen. Markedly diminutive basilar artery remains patent to its distal aspect. Superior cerebral arteries patent bilaterally. Fetal type origin of the PCAs supplied via bilateral posterior communicating arteries. Both PCAs are well perfused to their distal aspects. Venous sinuses: Not well evaluated  due to timing the contrast bolus. Suspected superior sagittal sinus occlusion at the level of the parafalcine meningioma. Anatomic variants: None significant. No appreciable intracranial aneurysm. Review of the MIP images confirms the above findings IMPRESSION: CT HEAD IMPRESSION: 1. No significant interval change of multifocal evolving ischemic infarcts, better evaluated on recent brain MRI. No hemorrhagic transformation or other complication. 2. Extensive underlying changes related to NF2, stable. No new intracranial abnormality. CTA HEAD AND NECK IMPRESSION: 1. Negative CTA for large vessel occlusion. No hemodynamically significant or correctable stenosis. The intracranial circulation is diffusely diminutive but remains patent. 2. Multifocal opacities throughout the visualized lungs, concerning for multifocal infection/pneumonia. 3. Large layering left pleural effusion, partially visualized. Electronically Signed   By: Jeannine Boga M.D.   On: 08/14/2020 04:27   DG Chest 1 View  Result Date: 08/16/2020 CLINICAL DATA:  Check endotracheal tube placement EXAM: CHEST  1 VIEW COMPARISON:  08/14/2020 FINDINGS: Cardiac shadow is stable. Postsurgical changes are again seen. Endotracheal tube, gastric  catheter and right-sided PICC line are again seen and stable. Right-sided pigtail catheter is noted as well. Small left-sided pleural effusion is seen. No focal confluent infiltrate is noted. IMPRESSION: New left-sided pleural effusion when compare with the prior exam. Tubes and lines as described. Electronically Signed   By: Inez Catalina M.D.   On: 08/16/2020 08:14   DG Chest 1 View  Result Date: 08/13/2020 CLINICAL DATA:  Hypertension. EXAM: CHEST  1 VIEW COMPARISON:  August 12, 2020 FINDINGS: Status post intubation. Endotracheal tube in satisfactory position. Stable position of right PICC line. Placement of enteric catheter which coils on itself over the expected location of the gastric cardia. Right-sided drainage catheter overlies the right lower thorax. Cardiomediastinal silhouette is normal. Mediastinal contours appear intact. No evidence of pneumothorax. Patchy bilateral areas of reticular airspace consolidation. Stable mild elevation right hemidiaphragm may represent sub pulmonic effusion. Osseous structures are without acute abnormality. Soft tissues are grossly normal. IMPRESSION: 1. Status post intubation with satisfactory position of the endotracheal tube. 2. Enteric catheter coils on itself over the expected location of the gastric cardia. 3. Patchy bilateral areas of reticular airspace consolidation may represent multifocal pneumonia or aspiration. Electronically Signed   By: Fidela Salisbury M.D.   On: 08/13/2020 12:20   DG Chest 1 View  Result Date: 08/12/2020 CLINICAL DATA:  Acute respiratory failure. EXAM: CHEST  1 VIEW COMPARISON:  08/11/2020 FINDINGS: Heart size remains normal. Right arm PICC line remains in appropriate position. Posterior spinal fusion hardware again seen. Moderate symmetric bilateral airspace disease shows no significant change. Moderate right and small left pleural effusions are again seen, without significant change allowing for differences in semi-erect  positioning on current exam. Increased atelectasis or infiltrate noted in right lung base. No pneumothorax visualized. IMPRESSION: Increased right basilar atelectasis versus infiltrate. Otherwise stable symmetric bilateral airspace disease, and right greater than left pleural effusions. Electronically Signed   By: Marlaine Hind M.D.   On: 08/12/2020 11:48   DG Abd 1 View  Result Date: 08/14/2020 CLINICAL DATA:  OG tube placement EXAM: ABDOMEN - 1 VIEW COMPARISON:  08/12/2020 FINDINGS: Enteric tube is present with tip in the left upper quadrant consistent with location in the upper stomach. Pigtail catheter on the right, likely in the posterior right pleural space. Postoperative fixation of the thoracolumbar spine. Scattered gas within the colon. No small or large bowel distention. IMPRESSION: Enteric tube tip is in the left upper quadrant consistent with location in the upper stomach. Electronically Signed  By: Lucienne Capers M.D.   On: 08/14/2020 02:00   DG Abd 1 View  Result Date: 08/12/2020 CLINICAL DATA:  Orogastric tube placement. EXAM: ABDOMEN - 1 VIEW COMPARISON:  None. FINDINGS: Limited view of the upper abdomen demonstrates enteric catheter which overlies the expected location of the stomach and terminates in the location of the gastric cardia. Nonspecific gaseous distension of small bowel loops in the abdomen. Partially visualized opacification of the right lower hemithorax. IMPRESSION: Enteric catheter terminates in the expected location of the gastric cardia. Electronically Signed   By: Fidela Salisbury M.D.   On: 08/12/2020 19:14   CT HEAD WO CONTRAST  Result Date: 08/13/2020 CLINICAL DATA:  Initial evaluation for acute mental status change. 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 EXAM: CT HEAD WITHOUT CONTRAST TECHNIQUE: Contiguous axial images were obtained from the base of the skull through the vertex without intravenous contrast. COMPARISON:  Prior CT from 08/08/2020 as well as  previous MRI from 05/24/2020. FINDINGS: Brain: Examination degraded by motion artifact. Known extensive meningiomatosis again seen, with most notable finding including a dominant parafalcine component. Scattered en plaque meningiomas overlying the cerebral convexities, with additional numerous supratentorial infratentorial meningiomas again seen, not significantly changed from previous. Associated bifrontal edema appears not significantly changed allowing for differences in technique and motion artifact. Few scattered parenchymal calcifications again noted. Herniation of the right gyrus rectus into the sella again noted, grossly stable. No acute intracranial hemorrhage. There is question of an increase focal hypodensity measuring approximately 1.3 cm at the posterior limb of the right internal capsule (series 5, image 52), not entirely certain given the motion degradation on this exam. An evolving acute ischemic infarct is difficult to exclude. Gray-white matter differentiation otherwise maintained with no other acute large vessel territory infarct identified. No midline shift or hydrocephalus. No extra-axial fluid collection. Vascular: No hyperdense vessel. Skull: Sequelae of previous bifrontal craniotomy again noted. EEG leads overlie the scalp. Sinuses/Orbits: Calcifications along the optic nerve sheaths most likely reflects small meningiomas. Globes and orbital soft tissues demonstrate no other acute finding. Osteoma at the left frontoethmoidal recess again noted. Mild mucoperiosteal thickening noted throughout the remaining paranasal sinuses. Increased moderate bilateral mastoid effusions from prior. Other: None. IMPRESSION: 1. Motion degraded exam. 2. Question an approximate 1.3 cm area of increased hypodensity involving the posterior limb of the right internal capsule. While this finding may be artifactual in nature given motion artifact on this exam, a possible evolving ischemic infarct is difficult to  exclude, and could be considered in the correct clinical setting. 3. No other new acute intracranial abnormality. 4. Underlying extensive changes related to NF 2 with meningiomatosis and associated bifrontal edema, stable. Electronically Signed   By: Jeannine Boga M.D.   On: 08/13/2020 02:48   CT ANGIO NECK W OR WO CONTRAST  Result Date: 08/14/2020 CLINICAL DATA:  Follow-up examination for stroke. EXAM: CT ANGIOGRAPHY HEAD AND NECK TECHNIQUE: Multidetector CT imaging of the head and neck was performed using the standard protocol during bolus administration of intravenous contrast. Multiplanar CT image reconstructions and MIPs were obtained to evaluate the vascular anatomy. Carotid stenosis measurements (when applicable) are obtained utilizing NASCET criteria, using the distal internal carotid diameter as the denominator. CONTRAST:  62m OMNIPAQUE IOHEXOL 350 MG/ML SOLN COMPARISON:  Prior MRI from 08/13/2020. FINDINGS: CT HEAD FINDINGS Brain: Extensive changes of NF 2 again noted, fully described on prior studies. Recently identified ischemic infarcts better seen on recent brain MRI, grossly stable. No significant mass effect, hemorrhagic  transformation, or other complication. No other definite new acute intracranial abnormality. No intracranial hemorrhage. No midline shift or hydrocephalus. No extra-axial fluid collection. Vascular: No hyperdense vessel. Skull: Stable.  No new finding identified. Sinuses: Partial opacification of the right frontal sinus, with scattered mucosal thickening within the ethmoidal air cells and maxillary sinuses. Left frontal osteoma again noted. Moderate bilateral mastoid effusions. Orbits: Unchanged.  No new or acute finding. Review of the MIP images confirms the above findings CTA NECK FINDINGS Aortic arch: Visualized aortic arch normal in caliber with normal branch pattern. No hemodynamically significant stenosis seen about the origin of the great vessels. Right carotid  system: Right common and internal carotid arteries widely patent without stenosis, dissection or occlusion. Left carotid system: Left common and internal carotid arteries widely patent without stenosis, dissection or occlusion. Vertebral arteries: Both vertebral arteries arise from the subclavian arteries. Vertebral arteries widely patent within the neck without stenosis, dissection or occlusion. Skeleton: No visible acute osseous abnormality. Posterior Harrington fixation rods partially visualized at the upper thoracic spine. Osseous structures are diffusely heterogeneous in appearance without definite discrete lytic or blastic osseous lesions. Other neck: Endotracheal and enteric tubes in place. No other acute soft tissue abnormality within the neck. Upper chest: Multifocal parenchymal opacity noted within the visualized lungs, most pronounced at the posterior right upper lobe, concerning for multifocal infection. Large heterogeneous layering left pleural effusion partially visualized. Right central venous catheter in place. Review of the MIP images confirms the above findings CTA HEAD FINDINGS Anterior circulation: Petrous segments patent bilaterally. Mild smooth narrowing at the para clinoid right ICA. Both internal carotid arteries otherwise widely patent to the termini. A1 segments patent bilaterally. Normal anterior communicating artery complex. Anterior cerebral arteries are markedly diminutive but patent to their distal aspects without stenosis or occlusion. Similarly, M1 segments are diminutive but patent as well. Negative MCA bifurcations. Distal MCA branches well perfused bilaterally. Posterior circulation: Diminutive vertebral arteries patent to the vertebrobasilar junction without stenosis. Left vertebral artery slightly dominant. Patent right PICA. Left PICA not seen. Markedly diminutive basilar artery remains patent to its distal aspect. Superior cerebral arteries patent bilaterally. Fetal type  origin of the PCAs supplied via bilateral posterior communicating arteries. Both PCAs are well perfused to their distal aspects. Venous sinuses: Not well evaluated due to timing the contrast bolus. Suspected superior sagittal sinus occlusion at the level of the parafalcine meningioma. Anatomic variants: None significant. No appreciable intracranial aneurysm. Review of the MIP images confirms the above findings IMPRESSION: CT HEAD IMPRESSION: 1. No significant interval change of multifocal evolving ischemic infarcts, better evaluated on recent brain MRI. No hemorrhagic transformation or other complication. 2. Extensive underlying changes related to NF2, stable. No new intracranial abnormality. CTA HEAD AND NECK IMPRESSION: 1. Negative CTA for large vessel occlusion. No hemodynamically significant or correctable stenosis. The intracranial circulation is diffusely diminutive but remains patent. 2. Multifocal opacities throughout the visualized lungs, concerning for multifocal infection/pneumonia. 3. Large layering left pleural effusion, partially visualized. Electronically Signed   By: Jeannine Boga M.D.   On: 08/14/2020 04:27   MR BRAIN WO CONTRAST  Result Date: 08/13/2020 CLINICAL DATA:  Anoxic brain injury. EXAM: MRI HEAD WITHOUT CONTRAST TECHNIQUE: Multiplanar, multiecho pulse sequences of the brain and surrounding structures were obtained without intravenous contrast. COMPARISON:  08/13/2020 head CT and prior. 05/24/2020 MRI head and prior. FINDINGS: Lack of intravenous contrast limits evaluation. Brain: New cortically based restricted diffusion involving the bilateral frontal lobes most prominent medially. Small focal restricted diffusion  along the superior vermis (5:73). Left frontal white matter microhemorrhages are unchanged. Redemonstration of extensive meningiomatosis with with additional meningiomas overlying the bilateral cerebral convexities and infratentorially. Dominant parafalcine meningioma  with intralesional SWI signal dropout is grossly unchanged in size. Bifrontal perilesional edema is also unchanged. No midline shift, ventriculomegaly or extra-axial fluid collection. No definite new or enlarging mass lesions. Vascular: Major intracranial flow voids are patent proximally. Skull and upper cervical spine: Heterogeneity of the bone marrow signal most prominent at the vertex is unchanged. Sinuses/Orbits: Normal orbits. Sequela of chronic right sphenoid and left frontal allergic fungal sinusitis. Bilateral mastoid effusions. Other: None. IMPRESSION: Cortically based acute infarcts involving the bilateral frontal lobes most prominent in the ACA territory. Small superior vermian acute infarct. Chronic sequela of NF 2, grossly unchanged. No new or enlarging intracranial lesions however lack of intravenous contrast limits evaluation. Dominant parafalcine meningioma and bifrontal edema, grossly unchanged in size. These results were called by telephone at the time of interpretation on 08/13/2020 at 4:56 pm to provider [facility] , who verbally acknowledged these results. Electronically Signed   By: Primitivo Gauze M.D.   On: 08/13/2020 16:58   IR GASTROSTOMY TUBE MOD SED  Result Date: 09/05/2020 CLINICAL DATA:  Seizure, aspiration pneumonia, respiratory failure and need for percutaneous gastrostomy tube for nutrition. History of quadriplegia. EXAM: PERCUTANEOUS GASTROSTOMY TUBE PLACEMENT ANESTHESIA/SEDATION: 1.0 mg IV Versed; 25 mcg IV Fentanyl. Total Moderate Sedation Time 14 minutes. The patient's level of consciousness and physiologic status were continuously monitored during the procedure by Radiology nursing. CONTRAST:  31m OMNIPAQUE IOHEXOL 300 MG/ML  SOLN MEDICATIONS: 1 g IV vancomycin. IV antibiotic was administered in an appropriate time interval prior to needle puncture of the skin. 0.5 mg IV glucagon FLUOROSCOPY TIME:  3.0 minutes.  14.0 mGy. PROCEDURE: The procedure, risks, benefits, and  alternatives were explained to the patient's father. Questions regarding the procedure were encouraged and answered. The patient's father understands and consents to the procedure. The evening prior to the procedure, the patient was given thin liquid barium via a feeding tube in order to opacify the colon. A 5-French catheter was then advanced through the patient's mouth under fluoroscopy into the esophagus and to the level of the stomach. This catheter was used to insufflate the stomach with air under fluoroscopy. The abdominal wall was prepped with chlorhexidine in a sterile fashion, and a sterile drape was applied covering the operative field. A sterile gown and sterile gloves were used for the procedure. Local anesthesia was provided with 1% Lidocaine. A skin incision was made in the upper abdominal wall. Under fluoroscopy, an 18 gauge trocar needle was advanced into the stomach. Contrast injection was performed to confirm intraluminal position of the needle tip. A single T tack was then deployed in the lumen of the stomach. This was brought up to tension at the skin surface. Over a guidewire, a 9-French sheath was advanced into the lumen of the stomach. The wire was left in place as a safety wire. A loop snare device from a percutaneous gastrostomy kit was then advanced into the stomach. A floppy guide wire was advanced through the orogastric catheter under fluoroscopy in the stomach. The loop snare advanced through the percutaneous gastric access was used to snare the guide wire. This allowed withdrawal of the loop snare out of the patient's mouth by retraction of the orogastric catheter and wire. A 20-French bumper retention gastrostomy tube was looped around the snare device. It was then pulled back through the  patient's mouth. The retention bumper was brought up to the anterior gastric wall. The T tack suture was cut at the skin. The exiting gastrostomy tube was cut to appropriate length and a feeding  adapter applied. The catheter was injected with contrast material to confirm position and a fluoroscopic spot image saved. The tube was then flushed with saline. A dressing was applied over the gastrostomy exit site. COMPLICATIONS: None. FINDINGS: Initial fluoroscopy demonstrates adequate opacification of the colon by ingested barium in order to prevent colonic injury during the procedure. The stomach distended well with air allowing safe placement of the gastrostomy tube. After placement, the tip of the gastrostomy tube lies in the body of the stomach. IMPRESSION: Percutaneous gastrostomy with placement of a 20-French bumper retention tube in the body of the stomach. This tube can be used for percutaneous feeds beginning in 24 hours after placement. Electronically Signed   By: Aletta Edouard M.D.   On: 09/05/2020 16:21   DG CHEST PORT 1 VIEW  Result Date: 09/04/2020 CLINICAL DATA:  History of airway aspiration EXAM: PORTABLE CHEST 1 VIEW COMPARISON:  08/20/2020 FINDINGS: Right PICC line remains in place, unchanged. Interval removal of endotracheal tube. OG tube enters the stomach. Heart is normal size. Bilateral perihilar and lower lobe opacities. No effusions or pneumothorax. No acute bony abnormality. IMPRESSION: Perihilar and lower lobe airspace opacities could reflect edema or infection. Electronically Signed   By: Rolm Baptise M.D.   On: 09/04/2020 17:50   DG CHEST PORT 1 VIEW  Result Date: 08/20/2020 CLINICAL DATA:  44 year old male with respiratory failure, hypoxia. EXAM: PORTABLE CHEST 1 VIEW COMPARISON:  Portable chest 08/19/2020 and earlier. FINDINGS: Portable AP semi upright view at 1224 hours. Stable endotracheal tube tip below the clavicles. Enteric tube side hole is at the level of the gastric body. Stable right PICC line. Lung volumes and mediastinal contours are within normal limits. Allowing for portable technique the lungs are clear. No pneumothorax or pleural effusion. Diffuse posterior  spinal fusion hardware. Osteopenia. Multilevel chronic right lower rib fractures. Negative visible bowel gas pattern. IMPRESSION: 1.  Stable lines and tubes. 2.  No acute cardiopulmonary abnormality. Electronically Signed   By: Genevie Ann M.D.   On: 08/20/2020 13:56   DG CHEST PORT 1 VIEW  Result Date: 08/19/2020 CLINICAL DATA:  Acute respiratory failure with hypoxia EXAM: PORTABLE CHEST 1 VIEW COMPARISON:  08/17/2020 FINDINGS: Similar positioning of a right PICC, endotracheal tube, and gastric tube. Cardiomediastinal silhouette is within normal limits. Improved medial left lower lobe opacity. No discernible pneumothorax. Polyarticular degenerative change. Partially imaged thoracolumbar fusion. IMPRESSION: 1. Improved medial left lower lobe opacity. No new cardiopulmonary abnormality. 2. Stable positioning of support devices. Electronically Signed   By: Margaretha Sheffield MD   On: 08/19/2020 09:02   DG CHEST PORT 1 VIEW  Result Date: 08/17/2020 CLINICAL DATA:  Encounter for chest tube removal EXAM: PORTABLE CHEST 1 VIEW COMPARISON:  Portable exam 1346 hours compared to 0530 hours FINDINGS: Endotracheal tube, nasogastric tube, and RIGHT arm PICC line unchanged. Interval removal of pigtail RIGHT thoracostomy tube. No pneumothorax following RIGHT chest tube removal. Stable heart size and mediastinal contours. Atelectasis versus consolidation at medial LEFT lower lobe. Remaining lungs clear. No pleural effusion. Extensive spinal fixation hardware. IMPRESSION: No pneumothorax following RIGHT thoracostomy tube removal. Electronically Signed   By: Lavonia Dana M.D.   On: 08/17/2020 14:25   DG CHEST PORT 1 VIEW  Result Date: 08/17/2020 CLINICAL DATA:  Intubation.  Chest tube.  Respiratory failure. EXAM: PORTABLE CHEST 1 VIEW COMPARISON:  08/16/2020. FINDINGS: Endotracheal tube, NG tube, right PICC line, right chest tube in stable position. No pneumothorax. Heart size normal. Persistent left lower lobe infiltrate.  Persistent mild right upper lobe infiltrate. No pleural effusion. Prior thoracic spine fusion. IMPRESSION: 1. Lines and tubes including right chest tube in stable position. No pneumothorax. 2. Persistent left lower lobe infiltrate. Persistent mild right upper lobe infiltrate. Chest is unchanged from prior exam. Electronically Signed   By: Marcello Moores  Register   On: 08/17/2020 08:27   DG Chest Port 1 View  Result Date: 08/14/2020 CLINICAL DATA:  Intubation.  Chest tube. EXAM: PORTABLE CHEST 1 VIEW COMPARISON:  Chest x-ray 08/13/2020. FINDINGS: Endotracheal tube, NG tube, right PICC line, right chest tube in stable position. No pneumothorax. Heart size stable. Patchy bilateral pulmonary infiltrates are again noted without significant change. No pleural effusion. Prior thoracic spine fusion. IMPRESSION: 1. Lines and tubes including right chest tube in stable position. No pneumothorax. 2. Patchy bilateral pulmonary infiltrates again noted without significant change. Electronically Signed   By: Marcello Moores  Register   On: 08/14/2020 06:18   DG CHEST PORT 1 VIEW  Result Date: 08/12/2020 CLINICAL DATA:  Intubation. EXAM: PORTABLE CHEST 1 VIEW COMPARISON:  Radiograph earlier this day. FINDINGS: Endotracheal tube tip 4.8 cm from the carina below the clavicular heads. Right upper extremity PICC remains in place. There is a pigtail catheter projecting over the right lung base. Possible slight decrease in right pleural effusion and improved basilar opacity from earlier today. Left pleural effusion appears improved. No visualized pneumothorax. There is worsening patchy left perihilar opacity. Unchanged heart size. Extensive thoracolumbar spinal fusion hardware. IMPRESSION: 1. Endotracheal tube tip 4.8 cm from the carina below the clavicular heads. 2. Pigtail catheter projecting over the right lung base. Slight improvement in right pleural effusion and basilar opacity from earlier today. 3. Worsening patchy left perihilar opacity,  may be pulmonary edema, atelectasis or infection. Electronically Signed   By: Keith Rake M.D.   On: 08/12/2020 18:44   DG CHEST PORT 1 VIEW  Result Date: 08/11/2020 CLINICAL DATA:  Acute respiratory distress, EXAM: PORTABLE CHEST 1 VIEW COMPARISON:  Radiograph 08/08/2020 FINDINGS: Right upper extremity PICC tip terminates at the superior cavoatrial junction. There is a new moderate right pleural effusion with fluid tracking in the fissures. Suspect layering left effusion as well. Background of diffuse hazy interstitial opacities and indistinct pulmonary vascularity suggesting underlying pulmonary edema . More patchy opacity seen in the left lung, similar to prior. The cardiomediastinal contours are unremarkable. Redemonstration of the long segment thoracolumbar fusion incompletely visualized within the levels of imaging. The osseous structures appear diffusely demineralized which may limit detection of small or nondisplaced fractures. IMPRESSION: 1. Right upper extremity PICC tip terminates at the superior cavoatrial junction. 2. New moderate right pleural effusion with fluid tracking in the fissures. Suspect layering left effusion as well. Adjacent passive atelectasis. 3. Redemonstrated opacities in the left lung could reflect a pneumonia. 4. Background of diffuse hazy interstitial opacities and indistinct pulmonary vascularity suggesting underlying pulmonary edema as well. Electronically Signed   By: Lovena Le M.D.   On: 08/11/2020 22:35   DG Abd Portable 1V  Result Date: 09/03/2020 CLINICAL DATA:  Constipation. EXAM: PORTABLE ABDOMEN - 1 VIEW COMPARISON:  CT abdomen 09/01/2020. Abdominal radiograph 08/14/2020. FINDINGS: A feeding tube remains in place with tip in the region of the distal stomach or first portion of the duodenum. Gas is present throughout nondilated  colon without evidence of an increased stool burden. Gas is also present in scattered nondilated loops of small bowel. There is no  evidence of bowel obstruction. Extensive thoracolumbar spinal fusion is noted. IMPRESSION: Nonobstructed bowel gas pattern. Electronically Signed   By: Logan Bores M.D.   On: 09/03/2020 14:44   DG Swallowing Func-Speech Pathology  Result Date: 08/31/2020 Objective Swallowing Evaluation: Type of Study: MBS-Modified Barium Swallow Study  Patient Details Name: Cameron Hale [name]: [id number] Date of Birth: 25-May-1976 Today's Date: 08/31/2020 Time: SLP Start Time (ACUTE ONLY): 8003 -SLP Stop Time (ACUTE ONLY): 1032 SLP Time Calculation (min) (ACUTE ONLY): 17 min Past Medical History: Past Medical History: Diagnosis Date . Hard of hearing  . NF2 (neurofibromatosis 2) ([place])  . Pressure ulcer  . Quadriplegia ([place])  . Seizures ([place])  . Vestibular schwannoma [facility])  Past Surgical History: Past Surgical History: Procedure Laterality Date . brain bleed    mri 2009 . HAND EXPLORATION   . LUMBAR FUSION   . NEPHRECTOMY    Partial left nephrectomy  . SPINAL FUSION   HPI: Mr. JACION DISMORE is a 44 y.o. 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 Developed hypoglycemia, hypotension and increased WOB. Presents with acute hypoxic respiratory failure due to acute encephalopathy, acute strokes, chronic weakness from quadriplegia and neurofibromatosis, and acute medication induced pneumonitis. Pt intubated 9/25-10/4.  FEES 10/5-  severe, diffuse weakness across oral and pharyngeal musculature leading to delayed oral transit, spillage to pyriforms prior to the swallow and then severe residue after the swallow. Pt has bilateral errythema and indentations on posterior portion of the vocal folds. There is gradual silent aspiration of residue via the posterior commissure.   Subjective: pt alert, cooperative Assessment / Plan / Recommendation CHL IP CLINICAL IMPRESSIONS 08/31/2020 Clinical Impression Pt continues to demonstrate a severe oropharyngeal dysphagia characterized by widespread weakness. His oral phase is prolonged with reduced bolus cohesion, but leaving minimal amounts of residue behind. Pharyngeally he has limited movement diffusely, not able to clear the majority of boluses from his pharynx and not able to sufficiently close off his airway. Pt has consistent penetration during the swallow and after because of the residue throughout his pharynx, which is also consistently aspirated. At times the amount of aspiration is more trace, but even when it becomes larger in volume, pt cannot produce a cough (volitionally or reflexively) that can clear his airway. Cues were provided to attempt hard swallows and supraglottic swallows (pt already in a chin down position), but without significant improvement in function. Overall function appears likely to be consistent with initial FEES from 11/5. These results were reviewed with pt and his mother immediately after testing, also sharing that we may have a lot of dysphagia therapy ahead of him to work on swallowing function. Would continue to provide nutrition via alternate means, although perhaps considering a longer-term option, as SLP continues to follow for dysphagia treatment. Would also still provide 4-5 ice chips at a time after oral care, at least 3x a day (reviewed with RN as well).  SLP Visit Diagnosis Dysphagia, oropharyngeal phase (R13.12) Attention and concentration deficit following -- Frontal lobe and executive function deficit following -- Impact on safety and function Severe aspiration risk   CHL IP TREATMENT RECOMMENDATION 08/31/2020 Treatment Recommendations Therapy as outlined in  treatment plan below   Prognosis 08/31/2020 Prognosis for Safe Diet Advancement Fair Barriers to Reach Goals Severity of deficits  Barriers/Prognosis Comment -- CHL IP DIET RECOMMENDATION 08/31/2020 SLP Diet Recommendations NPO Liquid Administration via -- Medication Administration Via alternative means Compensations -- Postural Changes --   CHL IP OTHER RECOMMENDATIONS 08/31/2020 Recommended Consults -- Oral Care Recommendations Oral care QID Other Recommendations --   CHL IP FOLLOW UP RECOMMENDATIONS 08/31/2020 Follow up Recommendations Home health SLP;24 hour supervision/assistance   CHL IP FREQUENCY AND DURATION 08/31/2020 Speech Therapy Frequency (ACUTE ONLY) min 2x/week Treatment Duration 2 weeks      CHL IP ORAL PHASE 08/31/2020 Oral Phase Impaired Oral - Pudding Teaspoon -- Oral - Pudding Cup -- Oral - Honey Teaspoon -- Oral - Honey Cup -- Oral - Nectar Teaspoon Decreased bolus cohesion;Delayed oral transit;Weak lingual manipulation Oral - Nectar Cup NT Oral - Nectar Straw -- Oral - Thin Teaspoon Decreased bolus cohesion;Delayed oral transit;Weak lingual manipulation Oral - Thin Cup NT Oral - Thin Straw -- Oral - Puree -- Oral - Mech Soft -- Oral - Regular -- Oral - Multi-Consistency -- Oral - Pill -- Oral Phase - Comment --  CHL IP PHARYNGEAL PHASE 08/31/2020 Pharyngeal Phase Impaired Pharyngeal- Pudding Teaspoon -- Pharyngeal -- Pharyngeal- Pudding Cup -- Pharyngeal -- Pharyngeal- Honey Teaspoon -- Pharyngeal -- Pharyngeal- Honey Cup -- Pharyngeal -- Pharyngeal- Nectar Teaspoon Delayed swallow initiation-vallecula;Reduced epiglottic inversion;Reduced pharyngeal peristalsis;Reduced anterior laryngeal mobility;Reduced laryngeal elevation;Reduced airway/laryngeal closure;Reduced tongue base retraction;Pharyngeal residue - valleculae;Pharyngeal residue - pyriform;Lateral channel residue;Inter-arytenoid space residue;Compensatory strategies attempted (with notebox);Penetration/Apiration after swallow Pharyngeal Material enters airway, passes BELOW cords without attempt by patient to eject out (silent aspiration) Pharyngeal- Nectar Cup NT  Pharyngeal -- Pharyngeal- Nectar Straw -- Pharyngeal -- Pharyngeal- Thin Teaspoon Delayed swallow initiation-vallecula;Reduced epiglottic inversion;Reduced pharyngeal peristalsis;Reduced anterior laryngeal mobility;Reduced laryngeal elevation;Reduced airway/laryngeal closure;Reduced tongue base retraction;Pharyngeal residue - valleculae;Pharyngeal residue - pyriform;Lateral channel residue;Inter-arytenoid space residue;Compensatory strategies attempted (with notebox);Penetration/Apiration after swallow;Penetration/Aspiration during swallow Pharyngeal Material enters airway, passes BELOW cords without attempt by patient to eject out (silent aspiration) Pharyngeal- Thin Cup NT Pharyngeal -- Pharyngeal- Thin Straw -- Pharyngeal -- Pharyngeal- Puree -- Pharyngeal -- Pharyngeal- Mechanical Soft -- Pharyngeal -- Pharyngeal- Regular -- Pharyngeal -- Pharyngeal- Multi-consistency -- Pharyngeal -- Pharyngeal- Pill -- Pharyngeal -- Pharyngeal Comment --  CHL IP CERVICAL ESOPHAGEAL PHASE 08/31/2020 Cervical Esophageal Phase WFL Pudding Teaspoon -- Pudding Cup -- Honey Teaspoon -- Honey Cup -- Nectar Teaspoon -- Nectar Cup -- Nectar Straw -- Thin Teaspoon -- Thin Cup -- Thin Straw -- Puree -- Mechanical Soft -- Regular -- Multi-consistency -- Pill -- Cervical Esophageal Comment -- [name], M.A. [facility] Pager [phone] Office [phone] 08/31/2020, 11:50 AM              EEG adult  Result Date: 08/09/2020 Lora Havens, MD     08/09/2020 11:38 AM Patient Name: Cameron Hale [name]: [id number] Epilepsy Attending: Lora Havens Referring Physician/Provider: Dr Sander Radon Date: 08/09/2020 Duration: 26.05 mins Patient history: 44 year old male with functional quadriparesis, hearing and visual loss with history of neurofibromatosis type II and seizures with prior event of status epilepticus who presents with altered mentation.  24 hours ago he was noted to have a generalized seizure  that recovered by itself. EEG to evaluate for seizure. Level of alertness: Awake, asleep AEDs during EEG study: [place] Technical aspects: This EEG study was done with scalp electrodes positioned according to the 10-20 International system of electrode placement. Dealer  activity was acquired at a sampling rate of 500Hz and reviewed with a high frequency filter of 70Hz and a low frequency filter of 1Hz. EEG data were recorded continuously and digitally stored. Description: No posterior dominant rhythm was seen. Sleep was characterized by vertex waves, maximal frontocentral region. EEG showed continuous generalized 3 to 6 Hz theta-delta slowing. Hyperventilation and photic stimulation were not performed.   ABNORMALITY -Continuous slow, generalized IMPRESSION: This study is suggestive of moderate diffuse encephalopathy, nonspecific etiology. No seizures or epileptiform discharges were seen throughout the recording. Lora Havens   ECHOCARDIOGRAM COMPLETE  Result Date: 08/14/2020    ECHOCARDIOGRAM REPORT   Patient Name:   Ayeden PURCELL JUNGBLUTH Date of Exam: 08/14/2020 Medical Rec #:  [id number]      Height:       70.0 in Accession #:    [id number]     Weight:       159.6 lb Date of Birth:  [birth year]      BSA:          1.897 m Patient Age:    35 years       BP:           175/144 mmHg Patient Gender: M              HR:           70 bpm. Exam Location:  Inpatient Procedure: 2D Echo, Cardiac Doppler, Color Doppler and Intracardiac            Opacification Agent Indications:    Stroke 434.91  History:        Patient has no prior history of Echocardiogram examinations.                 Risk Factors:Non-Smoker.  Sonographer:    Vickie Epley RDCS Referring Phys: [id number] ASHISH ARORA  Sonographer Comments: Technically difficult study due to poor echo windows and echo performed with patient supine and on artificial respirator. IMPRESSIONS  1. Left ventricular ejection fraction, by estimation, is 60 to 65%. The left ventricle has  normal function. The left ventricle has no regional wall motion abnormalities. Left ventricular diastolic parameters were normal.  2. Right ventricular systolic function is normal. The right ventricular size is normal. There is normal pulmonary artery systolic pressure.  3. The mitral valve is normal in structure. No evidence of mitral valve regurgitation. No evidence of mitral stenosis.  4. The aortic valve is normal in structure. Aortic valve regurgitation is not visualized. No aortic stenosis is present.  5. The inferior vena cava is normal in size with greater than 50% respiratory variability, suggesting right atrial pressure of 3 mmHg. Comparison(s): No prior Echocardiogram. Conclusion(s)/Recommendation(s): No intracardiac source of embolism detected on this transthoracic study. A transesophageal echocardiogram is recommended to exclude cardiac source of embolism if clinically indicated. FINDINGS  Left Ventricle: Left ventricular ejection fraction, by estimation, is 60 to 65%. The left ventricle has normal function. The left ventricle has no regional wall motion abnormalities. Definity contrast agent was given IV to delineate the left ventricular  endocardial borders. The left ventricular internal cavity size was normal in size. There is no left ventricular hypertrophy. Left ventricular diastolic parameters were normal. Right Ventricle: The right ventricular size is normal. No increase in right ventricular wall thickness. Right ventricular systolic function is normal. There is normal pulmonary artery systolic pressure. The tricuspid regurgitant velocity is 1.72 m/s, and  with an assumed right atrial pressure of 8 mmHg, the estimated right  ventricular systolic pressure is 40.9 mmHg. Left Atrium: Left atrial size was normal in size. Right Atrium: Right atrial size was normal in size. Pericardium: There is no evidence of pericardial effusion. Mitral Valve: The mitral valve is normal in structure. No evidence of  mitral valve regurgitation. No evidence of mitral valve stenosis. Tricuspid Valve: The tricuspid valve is normal in structure. Tricuspid valve regurgitation is mild . No evidence of tricuspid stenosis. Aortic Valve: The aortic valve is normal in structure. Aortic valve regurgitation is not visualized. No aortic stenosis is present. Pulmonic Valve: The pulmonic valve was normal in structure. Pulmonic valve regurgitation is not visualized. No evidence of pulmonic stenosis. Aorta: The aortic root is normal in size and structure. Venous: The inferior vena cava is normal in size with greater than 50% respiratory variability, suggesting right atrial pressure of 3 mmHg. IAS/Shunts: No atrial level shunt detected by color flow Doppler.   Diastology LV e' medial:    8.59 cm/s LV E/e' medial:  6.9 LV e' lateral:   11.60 cm/s LV E/e' lateral: 5.1  RIGHT VENTRICLE RV S prime:     13.90 cm/s TAPSE (M-mode): 1.7 cm LEFT ATRIUM           Index       RIGHT ATRIUM          Index LA Vol (A2C): 13.3 ml 7.01 ml/m  RA Area:     9.73 cm LA Vol (A4C): 31.3 ml 16.50 ml/m RA Volume:   20.70 ml 10.91 ml/m  AORTIC VALVE LVOT Vmax:   74.40 cm/s LVOT Vmean:  49.700 cm/s LVOT VTI:    0.160 m MITRAL VALVE               TRICUSPID VALVE MV Area (PHT): 4.21 cm    TR Peak grad:   11.8 mmHg MV Decel Time: 180 msec    TR Vmax:        172.00 cm/s MV E velocity: 59.60 cm/s MV A velocity: 37.70 cm/s  SHUNTS MV E/A ratio:  1.58        Systemic VTI: 0.16 m Ena Dawley MD Electronically signed by Ena Dawley MD Signature Date/Time: 08/14/2020/3:35:19 PM    Final    VAS [place] UPPER EXTREMITY VENOUS DUPLEX  Result Date: 09/03/2020 UPPER VENOUS STUDY  Indications: Swelling Risk Factors: None identified. Comparison Study: No prior studies. Performing Technologist: Oliver Hum RVT  Examination Guidelines: A complete evaluation includes B-mode imaging, spectral Doppler, color Doppler, and power Doppler as needed of all accessible portions of each  vessel. Bilateral testing is considered an integral part of a complete examination. Limited examinations for reoccurring indications may be performed as noted.  Right Findings: +----------+------------+---------+-----------+----------+-------+ RIGHT     CompressiblePhasicitySpontaneousPropertiesSummary +----------+------------+---------+-----------+----------+-------+ Subclavian    Full       Yes       Yes                      +----------+------------+---------+-----------+----------+-------+  Left Findings: +----------+------------+---------+-----------+----------+-------+ LEFT      CompressiblePhasicitySpontaneousPropertiesSummary +----------+------------+---------+-----------+----------+-------+ IJV           Full       Yes       Yes                      +----------+------------+---------+-----------+----------+-------+ Subclavian    Full       Yes       Yes                      +----------+------------+---------+-----------+----------+-------+  Axillary      Full       Yes       Yes                      +----------+------------+---------+-----------+----------+-------+ Brachial      Full       Yes       Yes                      +----------+------------+---------+-----------+----------+-------+ Radial        Full                                          +----------+------------+---------+-----------+----------+-------+ Ulnar         Full                                          +----------+------------+---------+-----------+----------+-------+ Cephalic      Full                                          +----------+------------+---------+-----------+----------+-------+ Basilic       Full                                          +----------+------------+---------+-----------+----------+-------+  Summary:  Right: No evidence of thrombosis in the subclavian.  Left: No evidence of deep vein thrombosis in the upper extremity. No evidence of  superficial vein thrombosis in the upper extremity.  *See table(s) above for measurements and observations.  Diagnosing physician: Monica Martinez MD Electronically signed by Monica Martinez MD on 09/03/2020 at 3:00:15 PM.    Final    [place] EKG SITE RITE  Result Date: 08/11/2020 If Site Rite image not attached, placement could not be confirmed due to current cardiac rhythm.  [place] EKG SITE RITE  Result Date: 08/10/2020 If Site Rite image not attached, placement could not be confirmed due to current cardiac rhythm.  [place] EKG SITE RITE  Result Date: 08/10/2020 If Site Rite image not attached, placement could not be confirmed due to current cardiac rhythm.  US Abdomen Limited RUQ  Result Date: 08/09/2020 CLINICAL DATA:  Transaminasemia EXAM: ULTRASOUND ABDOMEN LIMITED RIGHT UPPER QUADRANT COMPARISON:  None. FINDINGS: Limited patient mobility limits evaluation. Gallbladder: No gallstones or wall thickening visualized. Incidental 2.7 mm polyp. No sonographic Murphy sign noted by sonographer. Common bile duct: Diameter: 4.2 mm Liver: No focal lesion identified. Increased parenchymal echogenicity. Portal vein is patent on color Doppler imaging with normal direction of blood flow towards the liver. Other: None. IMPRESSION: Hepatic steatosis.  No focal hepatic lesion. Incidental 2.7 mm polyp. Otherwise unremarkable appearance of the gallbladder and bile ducts. Electronically Signed   By: Primitivo Gauze M.D.   On: 08/09/2020 11:20    Labs: BNP (last 3 results) No results for input(s): BNP in the last 8760 hours. Basic Metabolic Panel: Recent Labs  Lab 09/04/20 2120 09/05/20 0409 09/06/20 0416 09/07/20 0445 09/08/20 0425  NA 139 138 137 138 138  K 4.0 4.2 4.1 4.0 4.1  CL 106 105 106 104 104  CO2 _0 GLUCOSE 151* 106* 105* 128* 93  BUN 28* 29* 23* 25* 26*  CREATININE <0.30* <0.30* <0.30* <0.30* <0.30*  CALCIUM 7.9* 8.0* 7.5* 7.8* 8.0*   Liver Function Tests: Recent Labs  Lab  09/04/20 2120  AST 20  ALT 52*  ALKPHOS 71  BILITOT 0.3  PROT 4.1*  ALBUMIN 1.7*   No results for input(s): LIPASE, AMYLASE in the last 168 hours. No results for input(s): AMMONIA in the last 168 hours. CBC: Recent Labs  Lab 09/03/20 1310 09/05/20 0409 09/06/20 0416 09/07/20 0445 09/08/20 0425  WBC 9.6 9.7 8.5 9.1 9.6  HGB 8.2* 7.6* 7.4* 7.4* 7.5*  HCT 26.2* 24.9* 23.8* 24.0* 24.2*  MCV 86.5 86.8 88.1 87.9 87.4  PLT 176 153 122* 118* 139*   Cardiac Enzymes: No results for input(s): CKTOTAL, CKMB, CKMBINDEX, TROPONINI in the last 168 hours. BNP: Invalid input(s): POCBNP CBG: Recent Labs  Lab 09/07/20 1524 09/07/20 1956 09/07/20 2346 09/08/20 0342 09/08/20 0751  GLUCAP 169* 133* 134* 114* 82   D-Dimer No results for input(s): DDIMER in the last 72 hours. Hgb A1c No results for input(s): HGBA1C in the last 72 hours. Lipid Profile No results for input(s): CHOL, HDL, LDLCALC, TRIG, CHOLHDL, LDLDIRECT in the last 72 hours. Thyroid function studies No results for input(s): TSH, T4TOTAL, T3FREE, THYROIDAB in the last 72 hours.  Invalid input(s): FREET3 Anemia work up No results for input(s): VITAMINB12, FOLATE, FERRITIN, TIBC, IRON, RETICCTPCT in the last 72 hours. Urinalysis    Component Value Date/Time   COLORURINE YELLOW 09/03/2020 1103   APPEARANCEUR CLEAR 09/03/2020 1103   LABSPEC 1.013 09/03/2020 1103   PHURINE 6.0 09/03/2020 1103   GLUCOSEU NEGATIVE 09/03/2020 1103   HGBUR NEGATIVE 09/03/2020 1103   [place] 09/03/2020 1103   [place] 09/03/2020 1103   PROTEINUR NEGATIVE 09/03/2020 1103   NITRITE NEGATIVE 09/03/2020 1103   LEUKOCYTESUR NEGATIVE 09/03/2020 1103   Sepsis Labs Invalid input(s): PROCALCITONIN,  WBC,  LACTICIDVEN Microbiology Recent Results (from the past 240 hour(s))  Culture, blood (routine x 2)     Status: None   Collection Time: 09/01/20  4:08 PM   Specimen: BLOOD LEFT HAND  Result Value Ref Range Status    Specimen Description BLOOD LEFT HAND  Final   Special Requests   Final    BOTTLES DRAWN AEROBIC AND ANAEROBIC Blood Culture adequate volume   Culture   Final    NO GROWTH 5 DAYS Performed at [facility], [place] [address], [place], [place] [address]    Report Status 09/06/2020 FINAL  Final  Culture, blood (routine x 2)     Status: None   Collection Time: 09/01/20  4:16 PM   Specimen: BLOOD  Result Value Ref Range Status   Specimen Description BLOOD LEFT ANTECUBITAL  Final   Special Requests   Final    BOTTLES DRAWN AEROBIC AND ANAEROBIC Blood Culture adequate volume   Culture   Final    NO GROWTH 5 DAYS Performed at [facility], [place] [address], [place], [place] [address]    Report Status 09/06/2020 FINAL  Final  Culture, Urine     Status: Abnormal   Collection Time: 09/03/20 10:54 AM   Specimen: Urine, Random  Result Value Ref Range Status   Specimen Description URINE, RANDOM  Final   Special Requests   Final    NONE Performed at [facility], [place] [address], [place], [place] [address]  Culture 30,000 COLONIES/mL YEAST (A)  Final   Report Status 09/04/2020 FINAL  Final     Time coordinating discharge: 35  minutes  SIGNED: Antonieta Pert, MD  [facility] 09/08/2020, 10:55 AM  If 7PM-7AM, please contact night-coverage [email or web address]

## 2020-09-08 NOTE — Progress Notes (Signed)
Pt education given with mother at bedside. Mother demonstrated understanding of administering tube feeds. Pt foley removed. PICC removed by IV team. Pt taken to vehicle in w[place] by NT.

## 2020-09-09 ENCOUNTER — Telehealth: Payer: Self-pay

## 2020-09-09 NOTE — Telephone Encounter (Signed)
Made several calls today concerning this case. One was to [employer], which looked up the information and stated the package with materials ordered was shipped, they called this CM back to state that it is due to arrive in 5-7 days.  Next I called pharmacy to see what they could do, as there was no Osmolite available until Monday . They finally did state they could offer a few large bottles of osmolite 1.5 to give to patient that would see him through until they would pick up his feeding on Monday. I called Mrs. Cameron Hale the mother and used AIDET in my communication. I stated if she could pick up all the offerings at the front desk. She stated she had a syringe for him and could use that and she thanked [place] for our diligence. She will pick up the osmolite 1.5 today from the ED front desk. The front desk was informed in person by this RNCM. And given my name and number if any questions arose with the exchange.

## 2020-09-11 ENCOUNTER — Ambulatory Visit: Payer: Medicare Other | Admitting: Urology

## 2020-09-13 ENCOUNTER — Telehealth: Payer: Self-pay | Admitting: *Deleted

## 2020-09-13 NOTE — Telephone Encounter (Signed)
I left a message with [name] that she needs to contact the PCP. I first left VM saying it was ol to in and out cath per Dr Ranell Patrick but since he has not been seen since July and he has been back in the hospital since then, I left second VM telling her to contact the PCP.Cameron Hale

## 2020-09-13 NOTE — Telephone Encounter (Addendum)
Whitney, LPN, [facility] left a message asking for verbal orders for in and out cath to drain bladder.   Patient 'No showed' last appointment 08/09/2020.  This was due to the fact that patient was readmitted to the hospital on 08/08/2020.  Case Manager, Cameron Gibbons, RN states in her note on 09/08/2020:  "Pt stable for transition home today with parents. Was active with [facility] prior to admission for [facility] needs- orders have been placed to resume [place] needs- RN/PT/SLP/aide- mom wants to continue services with [facility]- have notified Erin with [facility] for resumption of [place] services on discharge.  Pt now with PEG tube and plan for bolus TF at home- orders placed for home tube feedings- mom wants to use "advanced" for this as well- Call made to [employer] for home TF needs- they will process and deliver supplies to the home".

## 2020-09-18 ENCOUNTER — Other Ambulatory Visit: Payer: Self-pay

## 2020-09-18 ENCOUNTER — Encounter (HOSPITAL_COMMUNITY): Payer: Self-pay

## 2020-09-18 ENCOUNTER — Emergency Department (HOSPITAL_COMMUNITY): Payer: Medicare Other

## 2020-09-18 ENCOUNTER — Inpatient Hospital Stay (HOSPITAL_COMMUNITY)
Admission: EM | Admit: 2020-09-18 | Discharge: 2020-10-18 | DRG: 870 | Disposition: E | Payer: Medicare Other | Attending: Critical Care Medicine | Admitting: Critical Care Medicine

## 2020-09-18 DIAGNOSIS — Z01818 Encounter for other preprocedural examination: Secondary | ICD-10-CM

## 2020-09-18 DIAGNOSIS — D62 Acute posthemorrhagic anemia: Secondary | ICD-10-CM | POA: Diagnosis present

## 2020-09-18 DIAGNOSIS — Q8500 Neurofibromatosis, unspecified: Secondary | ICD-10-CM | POA: Diagnosis present

## 2020-09-18 DIAGNOSIS — J9621 Acute and chronic respiratory failure with hypoxia: Secondary | ICD-10-CM | POA: Diagnosis present

## 2020-09-18 DIAGNOSIS — T68XXXA Hypothermia, initial encounter: Secondary | ICD-10-CM | POA: Diagnosis present

## 2020-09-18 DIAGNOSIS — K592 Neurogenic bowel, not elsewhere classified: Secondary | ICD-10-CM | POA: Diagnosis present

## 2020-09-18 DIAGNOSIS — R9389 Abnormal findings on diagnostic imaging of other specified body structures: Secondary | ICD-10-CM | POA: Diagnosis present

## 2020-09-18 DIAGNOSIS — Z66 Do not resuscitate: Secondary | ICD-10-CM | POA: Diagnosis not present

## 2020-09-18 DIAGNOSIS — Z86011 Personal history of benign neoplasm of the brain: Secondary | ICD-10-CM

## 2020-09-18 DIAGNOSIS — J9622 Acute and chronic respiratory failure with hypercapnia: Secondary | ICD-10-CM | POA: Diagnosis present

## 2020-09-18 DIAGNOSIS — R4182 Altered mental status, unspecified: Secondary | ICD-10-CM | POA: Diagnosis present

## 2020-09-18 DIAGNOSIS — J9383 Other pneumothorax: Secondary | ICD-10-CM | POA: Diagnosis not present

## 2020-09-18 DIAGNOSIS — D649 Anemia, unspecified: Secondary | ICD-10-CM | POA: Diagnosis not present

## 2020-09-18 DIAGNOSIS — J9811 Atelectasis: Secondary | ICD-10-CM | POA: Diagnosis present

## 2020-09-18 DIAGNOSIS — Z20822 Contact with and (suspected) exposure to covid-19: Secondary | ICD-10-CM | POA: Diagnosis present

## 2020-09-18 DIAGNOSIS — J9602 Acute respiratory failure with hypercapnia: Secondary | ICD-10-CM | POA: Diagnosis not present

## 2020-09-18 DIAGNOSIS — N390 Urinary tract infection, site not specified: Secondary | ICD-10-CM | POA: Diagnosis present

## 2020-09-18 DIAGNOSIS — J9690 Respiratory failure, unspecified, unspecified whether with hypoxia or hypercapnia: Secondary | ICD-10-CM

## 2020-09-18 DIAGNOSIS — G9341 Metabolic encephalopathy: Secondary | ICD-10-CM | POA: Diagnosis present

## 2020-09-18 DIAGNOSIS — J9601 Acute respiratory failure with hypoxia: Secondary | ICD-10-CM

## 2020-09-18 DIAGNOSIS — A4159 Other Gram-negative sepsis: Secondary | ICD-10-CM | POA: Diagnosis present

## 2020-09-18 DIAGNOSIS — R823 Hemoglobinuria: Secondary | ICD-10-CM | POA: Diagnosis present

## 2020-09-18 DIAGNOSIS — E872 Acidosis: Secondary | ICD-10-CM | POA: Diagnosis present

## 2020-09-18 DIAGNOSIS — R6521 Severe sepsis with septic shock: Secondary | ICD-10-CM | POA: Diagnosis present

## 2020-09-18 DIAGNOSIS — Z515 Encounter for palliative care: Secondary | ICD-10-CM

## 2020-09-18 DIAGNOSIS — Z931 Gastrostomy status: Secondary | ICD-10-CM

## 2020-09-18 DIAGNOSIS — Z0189 Encounter for other specified special examinations: Secondary | ICD-10-CM

## 2020-09-18 DIAGNOSIS — D696 Thrombocytopenia, unspecified: Secondary | ICD-10-CM | POA: Diagnosis present

## 2020-09-18 DIAGNOSIS — R4020 Unspecified coma: Secondary | ICD-10-CM | POA: Diagnosis not present

## 2020-09-18 DIAGNOSIS — E43 Unspecified severe protein-calorie malnutrition: Secondary | ICD-10-CM | POA: Diagnosis present

## 2020-09-18 DIAGNOSIS — Z981 Arthrodesis status: Secondary | ICD-10-CM

## 2020-09-18 DIAGNOSIS — L89153 Pressure ulcer of sacral region, stage 3: Secondary | ICD-10-CM | POA: Diagnosis present

## 2020-09-18 DIAGNOSIS — J9819 Other pulmonary collapse: Secondary | ICD-10-CM | POA: Diagnosis present

## 2020-09-18 DIAGNOSIS — G40909 Epilepsy, unspecified, not intractable, without status epilepticus: Secondary | ICD-10-CM | POA: Diagnosis present

## 2020-09-18 DIAGNOSIS — H5440 Blindness, one eye, unspecified eye: Secondary | ICD-10-CM | POA: Diagnosis present

## 2020-09-18 DIAGNOSIS — K922 Gastrointestinal hemorrhage, unspecified: Secondary | ICD-10-CM | POA: Diagnosis present

## 2020-09-18 DIAGNOSIS — A419 Sepsis, unspecified organism: Secondary | ICD-10-CM | POA: Diagnosis present

## 2020-09-18 DIAGNOSIS — E878 Other disorders of electrolyte and fluid balance, not elsewhere classified: Secondary | ICD-10-CM | POA: Diagnosis not present

## 2020-09-18 DIAGNOSIS — Z978 Presence of other specified devices: Secondary | ICD-10-CM

## 2020-09-18 DIAGNOSIS — N319 Neuromuscular dysfunction of bladder, unspecified: Secondary | ICD-10-CM | POA: Diagnosis present

## 2020-09-18 DIAGNOSIS — G8250 Quadriplegia, unspecified: Secondary | ICD-10-CM | POA: Diagnosis present

## 2020-09-18 DIAGNOSIS — E871 Hypo-osmolality and hyponatremia: Secondary | ICD-10-CM | POA: Diagnosis present

## 2020-09-18 DIAGNOSIS — Z881 Allergy status to other antibiotic agents status: Secondary | ICD-10-CM

## 2020-09-18 DIAGNOSIS — D3610 Benign neoplasm of peripheral nerves and autonomic nervous system, unspecified: Secondary | ICD-10-CM | POA: Diagnosis present

## 2020-09-18 DIAGNOSIS — Z9911 Dependence on respirator [ventilator] status: Secondary | ICD-10-CM | POA: Diagnosis not present

## 2020-09-18 DIAGNOSIS — T17890A Other foreign object in other parts of respiratory tract causing asphyxiation, initial encounter: Secondary | ICD-10-CM | POA: Diagnosis present

## 2020-09-18 DIAGNOSIS — J9600 Acute respiratory failure, unspecified whether with hypoxia or hypercapnia: Secondary | ICD-10-CM | POA: Diagnosis not present

## 2020-09-18 DIAGNOSIS — B961 Klebsiella pneumoniae [K. pneumoniae] as the cause of diseases classified elsewhere: Secondary | ICD-10-CM | POA: Diagnosis present

## 2020-09-18 DIAGNOSIS — J690 Pneumonitis due to inhalation of food and vomit: Secondary | ICD-10-CM | POA: Diagnosis present

## 2020-09-18 DIAGNOSIS — H9190 Unspecified hearing loss, unspecified ear: Secondary | ICD-10-CM | POA: Diagnosis present

## 2020-09-18 DIAGNOSIS — Z6821 Body mass index (BMI) 21.0-21.9, adult: Secondary | ICD-10-CM

## 2020-09-18 DIAGNOSIS — Z79899 Other long term (current) drug therapy: Secondary | ICD-10-CM

## 2020-09-18 DIAGNOSIS — R1310 Dysphagia, unspecified: Secondary | ICD-10-CM | POA: Diagnosis present

## 2020-09-18 DIAGNOSIS — R7989 Other specified abnormal findings of blood chemistry: Secondary | ICD-10-CM | POA: Diagnosis present

## 2020-09-18 DIAGNOSIS — R739 Hyperglycemia, unspecified: Secondary | ICD-10-CM | POA: Diagnosis not present

## 2020-09-18 DIAGNOSIS — R9401 Abnormal electroencephalogram [EEG]: Secondary | ICD-10-CM | POA: Diagnosis not present

## 2020-09-18 DIAGNOSIS — L89893 Pressure ulcer of other site, stage 3: Secondary | ICD-10-CM | POA: Diagnosis present

## 2020-09-18 DIAGNOSIS — Z885 Allergy status to narcotic agent status: Secondary | ICD-10-CM

## 2020-09-18 DIAGNOSIS — K625 Hemorrhage of anus and rectum: Secondary | ICD-10-CM

## 2020-09-18 DIAGNOSIS — F09 Unspecified mental disorder due to known physiological condition: Secondary | ICD-10-CM | POA: Diagnosis present

## 2020-09-18 DIAGNOSIS — R6520 Severe sepsis without septic shock: Secondary | ICD-10-CM | POA: Diagnosis not present

## 2020-09-18 DIAGNOSIS — R569 Unspecified convulsions: Secondary | ICD-10-CM

## 2020-09-18 DIAGNOSIS — L8990 Pressure ulcer of unspecified site, unspecified stage: Secondary | ICD-10-CM | POA: Insufficient documentation

## 2020-09-18 DIAGNOSIS — T380X5A Adverse effect of glucocorticoids and synthetic analogues, initial encounter: Secondary | ICD-10-CM | POA: Diagnosis not present

## 2020-09-18 DIAGNOSIS — J939 Pneumothorax, unspecified: Secondary | ICD-10-CM

## 2020-09-18 DIAGNOSIS — Z7982 Long term (current) use of aspirin: Secondary | ICD-10-CM

## 2020-09-18 DIAGNOSIS — E162 Hypoglycemia, unspecified: Secondary | ICD-10-CM | POA: Diagnosis present

## 2020-09-18 DIAGNOSIS — Z7189 Other specified counseling: Secondary | ICD-10-CM | POA: Diagnosis not present

## 2020-09-18 DIAGNOSIS — J9312 Secondary spontaneous pneumothorax: Secondary | ICD-10-CM | POA: Diagnosis not present

## 2020-09-18 DIAGNOSIS — Z888 Allergy status to other drugs, medicaments and biological substances status: Secondary | ICD-10-CM

## 2020-09-18 DIAGNOSIS — Z905 Acquired absence of kidney: Secondary | ICD-10-CM

## 2020-09-18 DIAGNOSIS — E876 Hypokalemia: Secondary | ICD-10-CM | POA: Diagnosis not present

## 2020-09-18 LAB — CBC WITH DIFFERENTIAL/PLATELET
Abs Immature Granulocytes: 0.85 10*3/uL — ABNORMAL HIGH (ref 0.00–0.07)
Basophils Absolute: 0 10*3/uL (ref 0.0–0.1)
Basophils Relative: 0 %
Eosinophils Absolute: 0 10*3/uL (ref 0.0–0.5)
Eosinophils Relative: 0 %
HCT: 19.6 % — ABNORMAL LOW (ref 39.0–52.0)
Hemoglobin: 6.2 g/dL — CL (ref 13.0–17.0)
Immature Granulocytes: 16 %
Lymphocytes Relative: 13 %
Lymphs Abs: 0.7 10*3/uL (ref 0.7–4.0)
MCH: 27.9 pg (ref 26.0–34.0)
MCHC: 31.6 g/dL (ref 30.0–36.0)
MCV: 88.3 fL (ref 80.0–100.0)
Monocytes Absolute: 0.5 10*3/uL (ref 0.1–1.0)
Monocytes Relative: 9 %
Neutro Abs: 3.2 10*3/uL (ref 1.7–7.7)
Neutrophils Relative %: 62 %
Platelets: 78 10*3/uL — ABNORMAL LOW (ref 150–400)
RBC: 2.22 MIL/uL — ABNORMAL LOW (ref 4.22–5.81)
RDW: 19.9 % — ABNORMAL HIGH (ref 11.5–15.5)
WBC: 5.2 10*3/uL (ref 4.0–10.5)
nRBC: 1 % — ABNORMAL HIGH (ref 0.0–0.2)

## 2020-09-18 LAB — PREPARE RBC (CROSSMATCH)

## 2020-09-18 LAB — BLOOD GAS, ARTERIAL
Acid-Base Excess: 2.4 mmol/L — ABNORMAL HIGH (ref 0.0–2.0)
Bicarbonate: 26.5 mmol/L (ref 20.0–28.0)
FIO2: 21
O2 Saturation: 98.6 %
Patient temperature: 37
pCO2 arterial: 49.8 mmHg — ABNORMAL HIGH (ref 32.0–48.0)
pH, Arterial: 7.359 (ref 7.350–7.450)
pO2, Arterial: 108 mmHg (ref 83.0–108.0)

## 2020-09-18 LAB — RESPIRATORY PANEL BY RT PCR (FLU A&B, COVID)
Influenza A by PCR: NEGATIVE
Influenza B by PCR: NEGATIVE
SARS Coronavirus 2 by RT PCR: NEGATIVE

## 2020-09-18 LAB — PROTIME-INR
INR: 1 (ref 0.8–1.2)
Prothrombin Time: 12.3 seconds (ref 11.4–15.2)

## 2020-09-18 LAB — LACTIC ACID, PLASMA
Lactic Acid, Venous: 0.9 mmol/L (ref 0.5–1.9)
Lactic Acid, Venous: 1.2 mmol/L (ref 0.5–1.9)

## 2020-09-18 LAB — COMPREHENSIVE METABOLIC PANEL
ALT: 94 U/L — ABNORMAL HIGH (ref 0–44)
AST: 40 U/L (ref 15–41)
Albumin: 1.7 g/dL — ABNORMAL LOW (ref 3.5–5.0)
Alkaline Phosphatase: 85 U/L (ref 38–126)
Anion gap: 3 — ABNORMAL LOW (ref 5–15)
BUN: 37 mg/dL — ABNORMAL HIGH (ref 6–20)
CO2: 27 mmol/L (ref 22–32)
Calcium: 7.2 mg/dL — ABNORMAL LOW (ref 8.9–10.3)
Chloride: 101 mmol/L (ref 98–111)
Creatinine, Ser: <0.3 mg/dL — ABNORMAL LOW (ref 0.61–1.24)
Glucose, Bld: 108 mg/dL — ABNORMAL HIGH (ref 70–99)
Potassium: 4.3 mmol/L (ref 3.5–5.1)
Sodium: 131 mmol/L — ABNORMAL LOW (ref 135–145)
Total Bilirubin: 0.2 mg/dL — ABNORMAL LOW (ref 0.3–1.2)
Total Protein: 3.9 g/dL — ABNORMAL LOW (ref 6.5–8.1)

## 2020-09-18 LAB — URINALYSIS, ROUTINE W REFLEX MICROSCOPIC
Bilirubin Urine: NEGATIVE
Glucose, UA: NEGATIVE mg/dL
Ketones, ur: NEGATIVE mg/dL
Nitrite: POSITIVE — AB
Protein, ur: 100 mg/dL — AB
Specific Gravity, Urine: 1.009 (ref 1.005–1.030)
WBC, UA: >50 WBC/hpf — ABNORMAL HIGH (ref 0–5)
pH: 7 (ref 5.0–8.0)

## 2020-09-18 LAB — POC OCCULT BLOOD, ED: Fecal Occult Bld: POSITIVE — AB

## 2020-09-18 LAB — CBG MONITORING, ED: Glucose-Capillary: 97 mg/dL (ref 70–99)

## 2020-09-18 LAB — APTT: aPTT: 33 seconds (ref 24–36)

## 2020-09-18 MED ORDER — SODIUM CHLORIDE 0.9 % IV SOLN
2.0000 g | Freq: Three times a day (TID) | INTRAVENOUS | Status: DC
  Administered 2020-09-18 – 2020-09-19 (×3): 2 g via INTRAVENOUS
  Filled 2020-09-18 (×3): qty 2

## 2020-09-18 MED ORDER — LACTATED RINGERS IV BOLUS (SEPSIS)
1000.0000 mL | Freq: Once | INTRAVENOUS | Status: AC
  Administered 2020-09-18: 1000 mL via INTRAVENOUS

## 2020-09-18 MED ORDER — METRONIDAZOLE IN NACL 5-0.79 MG/ML-% IV SOLN
500.0000 mg | Freq: Once | INTRAVENOUS | Status: AC
  Administered 2020-09-18: 500 mg via INTRAVENOUS
  Filled 2020-09-18: qty 100

## 2020-09-18 MED ORDER — SODIUM CHLORIDE 0.9 % IV SOLN
10.0000 mL/h | Freq: Once | INTRAVENOUS | Status: AC
  Administered 2020-09-18: 10 mL/h via INTRAVENOUS

## 2020-09-18 MED ORDER — LACTATED RINGERS IV BOLUS (SEPSIS)
250.0000 mL | Freq: Once | INTRAVENOUS | Status: AC
  Administered 2020-09-18: 250 mL via INTRAVENOUS

## 2020-09-18 MED ORDER — LACTATED RINGERS IV BOLUS
1000.0000 mL | Freq: Once | INTRAVENOUS | Status: AC
  Administered 2020-09-18: 1000 mL via INTRAVENOUS

## 2020-09-18 MED ORDER — PANTOPRAZOLE SODIUM 40 MG IV SOLR
40.0000 mg | Freq: Two times a day (BID) | INTRAVENOUS | Status: DC
  Administered 2020-09-19 – 2020-09-27 (×18): 40 mg via INTRAVENOUS
  Filled 2020-09-18 (×19): qty 40

## 2020-09-18 MED ORDER — VANCOMYCIN HCL 1500 MG/300ML IV SOLN
1500.0000 mg | Freq: Once | INTRAVENOUS | Status: AC
  Administered 2020-09-18: 1500 mg via INTRAVENOUS
  Filled 2020-09-18: qty 300

## 2020-09-18 MED ORDER — LACTATED RINGERS IV SOLN: INTRAVENOUS | Status: AC

## 2020-09-18 MED ORDER — VANCOMYCIN HCL 750 MG/150ML IV SOLN
750.0000 mg | Freq: Three times a day (TID) | INTRAVENOUS | Status: DC
  Administered 2020-09-19: 750 mg via INTRAVENOUS
  Filled 2020-09-18 (×8): qty 150

## 2020-09-18 NOTE — ED Notes (Signed)
Assisted Dr. Eulis Foster with rectal exam. Hemoccult POSITIVE.

## 2020-09-18 NOTE — ED Notes (Signed)
Pt O2 dropped to 85% on room air. Pt placed on 2L [place]. O2 is now 95%. Will continue to monitor.

## 2020-09-18 NOTE — ED Notes (Addendum)
Date and time results received: 09/19/2020 7:00 PM  Test: Hgb Critical Value: 6.2  Name of Provider Notified: Eulis Foster  Orders Received? Or Actions Taken?: Awaiting orders

## 2020-09-18 NOTE — H&P (Signed)
History and Physical    Cameron Hale OHY:[id number] DOB: 09/06/1976 DOA: 09/23/2020  PCP: Alroy Dust, L.Marlou Sa, MD   Patient coming from: Home.   I have personally briefly reviewed patient's old medical records in [place]  Chief Complaint: AMS.  HPI: Cameron Hale is a 44 y.o. male with medical history significant of impaired hearing, neurofibromatosis 2, neurogenic bladder, pressure ulcer, quadriplegia, seizures, vestibular schwannoma, history of cerebral thrombosis with cerebral infarction, admitted and intubated for about 2 weeks on 08/08/2020 until 09/08/2020 at [facility] who is coming to the emergency department who is brought to the emergency department due to progressively worse mental status since Friday, but particularly worse over the last 2 days.  EMS also reported that the patient was unresponsive to sternal rub initially and then became a little responsive.  He was hypotensive with a BP of 75/40.  No emesis, diarrhea or fever per patient's mother.  ED Course: Initial vital signs are temperature 93.1 F, pulse 64, respiration 18, blood pressure 84/52 mmHg and O2 sat 99% on room air. The patient received 3250 mL of LR bolus.  He was started on cefepime, vancomycin and metronidazole.  Urinalysis was turbid with small hemoglobinuria, positive nitrates and large leukocyte esterase.  11-20 RBC and more than 50 WBC per hpf.  There are a few bacteria present there is 6-10 known squamous epithelials cells.  There are WBC clumps.  Fecal occult blood was positive.  CBC showed a white count of 5.2 with 62% neutrophils, 13% lymphocytes and 9% monocytes.  Hemoglobin was 6.2 g/dL and platelets 78.  PT 12.3, INR 1.0 and PTT 33.  Venous blood gas showed a PCO2 of 49.8 mmHg.  Lactic acid has been normal twice.  Sodium 131, potassium 4.3 chloride 101 and CO2 27 mmol/L.  Glucose 108, BUN 37 creatinine less than 0.30 mg/dL.  Total protein is 3.9 and albumin 1.7 g/dL.  AST was 94 units/L.  SARS coronavirus  PCR was negative.  Imaging: Initial chest radiograph-mild medial basilar atelectasis.  Follow chest radiograph opacification of the left lung aspiration suspected.  CT head did not show any acute intracranial normality, but still demonstrates extensive meningiomatosis, but bifrontal edema stable.  Review of Systems: As per HPI otherwise all other systems reviewed and are negative.  Past Medical History:  Diagnosis Date  . Hard of hearing   . NF2 (neurofibromatosis 2) ([place])   . Pressure ulcer   . Quadriplegia ([employer])   . Seizures ([place])   . Vestibular schwannoma [facility])    Past Surgical History:  Procedure Laterality Date  . brain bleed     mri 2009  . HAND EXPLORATION    . IR GASTROSTOMY TUBE MOD SED  09/05/2020  . LUMBAR FUSION    . NEPHRECTOMY     Partial left nephrectomy   . SPINAL FUSION      Social History  reports that he has never smoked. He has never used smokeless tobacco. He reports previous alcohol use. He reports that he does not use drugs.  Allergies  Allergen Reactions  . Amoxicillin Itching  . Morphine Itching    No family history on file. Unable to obtain family history due to altered MS.  Prior to Admission medications   Medication Sig Start Date End Date Taking? Authorizing Provider  albuterol (VENTOLIN HFA) 108 (90 Base) MCG/ACT inhaler Inhale 1-2 puffs into the lungs every 6 (six) hours as needed for wheezing or shortness of breath. 09/08/20 10/08/20 Yes Antonieta Pert, MD  ammonium lactate (AMLACTIN) 12 % lotion Apply 1 application topically as needed for dry skin. 06/28/20  Yes Felipa Furnace, DPM  aspirin 81 MG chewable tablet Place 1 tablet (81 mg total) into feeding tube daily. 09/08/20 10/08/20 Yes Antonieta Pert, MD  carboxymethylcellulose (REFRESH TEARS) 0.5 % SOLN Place 1 drop into both eyes daily as needed (For dry eyes).   Yes [provider]  ferrous sulfate 300 (60 Fe) MG/5ML syrup Place 5 mLs (300 mg total) into feeding tube daily with  breakfast. 09/08/20  Yes Kc, Maren Beach, MD  levETIRAcetam (KEPPRA) 100 MG/ML solution Place 15 mLs (1,500 mg total) into feeding tube 2 (two) times daily. 09/08/20 11/07/20 Yes Antonieta Pert, MD  Nutritional Supplements (FEEDING SUPPLEMENT, OSMOLITE 1.5 CAL,) LIQD Place 360 mLs into feeding tube 4 (four) times daily for 28 days. 09/08/20 10/06/20 Yes Antonieta Pert, MD  Nutritional Supplements (FEEDING SUPPLEMENT, PROSOURCE TF,) liquid Place 45 mLs into feeding tube daily. 09/08/20 10/08/20 Yes Antonieta Pert, MD  Water For Irrigation, Sterile (FREE WATER) SOLN Place 200 mLs into feeding tube 4 (four) times daily. 09/08/20  Yes Kc, Maren Beach, MD  brigatinib (ALUNBRIG) 90 & 180 MG TBPK Take 90-180 mg by mouth daily. Check with your physician at [facility] about when to resume this medication Patient not taking: Reported on 10/02/2020 09/08/20   Antonieta Pert, MD  dexamethasone (DECADRON) 2 MG tablet 1 tablet (2 mg total) by Per J Tube route daily. HOLD IT WHILE HE IS GETTING PREDNISONE 09/08/20   Antonieta Pert, MD  pantoprazole sodium (PROTONIX) 40 mg/20 mL PACK Place 20 mLs (40 mg total) into feeding tube at bedtime. 09/08/20 11/07/20  Antonieta Pert, MD  predniSONE (DELTASONE) 20 MG tablet Take VIA peg TUBE 4 tabs daily x 3 days,3 tabs daily x 3 days,2 tabs daily x 3 days,1 tab daily x 3 days then stop. 09/08/20   Antonieta Pert, MD   Physical Exam: Vitals:   09/27/2020 2245 10/01/2020 2300 09/30/2020 2350 10/09/2020 2350  BP: 102/61 (!) 91/57 (!) 76/45   Pulse: 94 81 79   Resp: 20 20 18    Temp:   (!) 95.7 F (35.4 C)   TempSrc:   Rectal   SpO2: 95% 95% 94% 97%  Weight:      Height:       Constitutional: Looks acutely ill. Eyes: PERRL, lids and conjunctivae injected. ENMT: Mucous membranes are dry. Posterior pharynx clear of any exudate or lesions. Neck: normal, supple, no masses, no thyromegaly Respiratory: Mild bilateral rhonchi.  No wheezing.. No accessory muscle use.  Cardiovascular: Regular rate and rhythm, no murmurs /  rubs / gallops. No extremity edema. 2+ pedal pulses. No carotid bruits.  Abdomen: Feeding tube in place.  Nondistended.  Soft, no tenderness, no masses palpated. No hepatosplenomegaly. Bowel sounds positive.  Musculoskeletal: no clubbing / cyanosis.  Positive muscle atrophy with decreased muscle tone.  Skin: Multiple neurofibromas. Neurologic: Quadriplegic. Psychiatric: Obtunded.  Labs on Admission: I have personally reviewed following labs and imaging studies  CBC: Recent Labs  Lab 10/17/2020 1802  WBC 5.2  NEUTROABS 3.2  HGB 6.2*  HCT 19.6*  MCV 88.3  PLT 78*    Basic Metabolic Panel: Recent Labs  Lab 09/24/2020 1802  NA 131*  K 4.3  CL 101  CO2 27  GLUCOSE 108*  BUN 37*  CREATININE <0.30*  CALCIUM 7.2*    GFR: CrCl cannot be calculated (This lab value cannot be used to calculate CrCl because it is not a  number: <0.30).  Liver Function Tests: Recent Labs  Lab 10/14/2020 1802  AST 40  ALT 94*  ALKPHOS 85  BILITOT 0.2*  PROT 3.9*  ALBUMIN 1.7*    Urine analysis:    Component Value Date/Time   COLORURINE YELLOW 10/15/2020 1802   APPEARANCEUR TURBID (A) 10/09/2020 1802   LABSPEC 1.009 10/10/2020 1802   PHURINE 7.0 09/27/2020 1802   GLUCOSEU NEGATIVE 09/27/2020 1802   HGBUR SMALL (A) 10/02/2020 1802   BILIRUBINUR NEGATIVE 10/14/2020 1802   KETONESUR NEGATIVE 10/17/2020 1802   PROTEINUR 100 (A) 10/10/2020 1802   NITRITE POSITIVE (A) 09/22/2020 1802   LEUKOCYTESUR LARGE (A) 10/03/2020 1802    Radiological Exams on Admission: CT Head Wo Contrast  Result Date: 09/25/2020 CLINICAL DATA:  Hypotension.  Mental status changes. EXAM: CT HEAD WITHOUT CONTRAST TECHNIQUE: Contiguous axial images were obtained from the base of the skull through the vertex without intravenous contrast. COMPARISON:  08/14/2020 FINDINGS: Brain: Again noted are multiple meningiomas compatible with meningiomatosis, unchanged since prior study. Bifrontal edema surrounding the large para fall  seen meningioma, unchanged. No acute infarction or hemorrhage visualized. No hydrocephalus. Vascular: No hyperdense vessel or unexpected calcification. Skull: Prior craniotomy defects.  No acute calvarial abnormality. Sinuses/Orbits: No acute finding Other: None IMPRESSION: Extensive meningiomatosis again noted, unchanged. Bifrontal edema is also a stable. No acute intracranial abnormality. Electronically Signed   By: Rolm Baptise M.D.   On: 09/30/2020 22:57   DG Chest Port 1 View  Result Date: 10/15/2020 CLINICAL DATA:  Possible sepsis. EXAM: PORTABLE CHEST 1 VIEW COMPARISON:  Chest x-ray dated September 04, 2020. FINDINGS: Interval removal of the right PICC line. The heart size and mediastinal contours are within normal limits. Normal pulmonary vascularity. Mild medial bibasilar atelectasis. No focal consolidation, pleural effusion, or pneumothorax. No acute osseous abnormality. IMPRESSION: No active disease. Electronically Signed   By: Titus Dubin M.D.   On: 10/03/2020 18:24    EKG: Independently reviewed.  Vent. rate 63 BPM PR interval * ms QRS duration 105 ms QT/QTc 438/449 ms P-R-T axes 64 79 119 Sinus rhythm Nonspecific T abnormalities, lateral leads  Assessment/Plan Principal Problem:   Sepsis due to undetermined organism ([place]) Hypothermia has resolved. Admit to ICU/inpatient. Continue mechanical ventilation. Continue IV fluids. Continue pressors as needed. Monitor intake and output. Cefepime per pharmacy. On vancomycin per pharmacy. Metronidazole 500 mg every 8 hours. Hydrocortisone 100 mg every 8 hours. Follow blood culture and sensitivity. Follow CBC and CMP.  Active Problems:   Acute respiratory failure  The patient has been intubated. Continue mechanical ventilation. As needed sedation. Consult PCCM today. Poor prognosis given comorbidities.    Hyponatremia In the setting of sepsis, malnutrition. The patient recently finished prednisone taper. Per mother, he  was on daily dexamethasone for several months.    AMS (altered mental status) In the setting of: Sepsis. NF2 with quadriplegia. Acute respiratory failure. GI bleed with symptomatic anemia.    GI bleed Protonix 40 mg IVP every 12 hours. Keep n.p.o. Transfuse as needed. Consult gastroenterology later today.    Symptomatic anemia Transfusing. Monitor H&H.    Severe protein-calorie malnutrition ([place]) Continue tube feedings. Consult nutritional services. May need to change formula due to loose stools per mother.    Neurofibroma of multiple sites ([place])   Quadriplegia [facility]) Consider palliative care consult.    Seizures ([place]) Continue Keppra in IV form.   DVT prophylaxis:  SCDs. Code Status:   Full code. Family Communication:  I discussed the case numerous times  with the patient's mother.  I advised her of the need for intubation since he was not able to protect his airway, but she was hesitant to make a decision.  An ABG was drawn, which show worsening pH and CO2 retention.  I spoke to to her again about these results and need for intubation, but she stated after I explained multiple times the results and why the intervention was needed, that she did not understand me.  I subsequently  spoke to her husband who explained to her the situation.  Dr. Dina Rich subsequently spoke to the patient's mother multiple times about the need to intubate and then making the decision for longer term care once he was intubated.  Disposition Plan:   Patient is from:  Home.  Anticipated DC to:  Home.  Anticipated DC date:  TBD.  Anticipated DC barriers: Clinical status.  Consults called: Admission status:  Inpatient/ICU.  Severity of Illness:  Very high due to chronic conditions, quadriplegia, recently on mechanical ventilation.  Reubin Milan MD [facility]  How to contact the [facility] Attending or Consulting provider [place] or covering provider during after hours [place], for this  patient?   1. Check the care team in [facility] and look for a) attending/consulting [facility] provider listed and b) the [facility] team listed 2. Log into [email or web address] and use [facility]'s universal password to access. If you do not have the password, please contact the hospital operator. 3. Locate the [facility] provider you are looking for under [facility] and page to a number that you can be directly reached. 4. If you still have difficulty reaching the provider, please page the [facility] (Director on Call) for the Hospitalists listed on amion for assistance.  10/01/2020, 11:58 PM   This document was prepared using Dragon voice recognition software and may contain some unintended transcription errors.

## 2020-09-18 NOTE — ED Provider Notes (Signed)
[facility] EMERGENCY DEPARTMENT Provider Note   CSN: [id number] Arrival date & time: 10/15/2020  1703     History Chief Complaint  Patient presents with  . Hypotension    Cameron Hale is a 44 y.o. male.  HPI Patient presents from  Home,  By EMS, reported to be hypotensive and unresponsive, responding to sternal rub.  On arrival, blood pressure low.  He is unable to give history.  Initial temperature was hypothermic, 92, he was placed on a warming blanket.  EMS reported normal CBG, not documented by triage note.  Level 5 caveat-severe illness  The patient was DC'd from the hospital, about 1 week ago after a 1 month hospitalization, During which time he was treated for altered mental status with lethargy.  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  Apparently he has coincidental hearing loss, and blindness, which occurred around the same time.  The patient receives advanced nursing care, at his domicile, home.  During the recent hospitalization, he required intubation and this was complicated right side chest tubes placed for support.  PEG tube was placed, 09/05/2020.  Additional history from mother, at the bedside.  She reports that he has been talking less over the last 2 days but able to communicate some by head nods.    Past Medical History:  Diagnosis Date  . Hard of hearing   . 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 Pressure ulcer   . Quadriplegia ([place])   . Seizures ([place])   . Vestibular schwannoma [facility])     Patient Active Problem List   Diagnosis Date Noted  . GI bleed 10/13/2020  . DNR (do not resuscitate) discussion   . History of ETT   . Palliative care by specialist   . Cerebral thrombosis with cerebral infarction 08/14/2020  . Acute respiratory failure with hypoxia ([place]) 08/09/2020  . Transaminasemia   . AMS (altered mental status) 08/08/2020  . Aspiration pneumonia ([place])  08/08/2020  . Neurofibromatosis 2 ([place]) 08/08/2020  . Acute blood loss anemia   . Hypokalemia   . Hyponatremia   . Seizures ([place])   . Neurogenic bowel   . Incomplete paraplegia ([place]) 05/09/2020  . Quadriplegia ([place]) 05/09/2020  . Sensorineural hearing loss (SNHL) of right ear with unrestricted hearing of left ear 06/03/2019  . Tinnitus of right ear 06/03/2019  . Mixed conductive and sensorineural hearing loss of right ear with unrestricted hearing of left ear 05/04/2019  . Keratoma 05/01/2016  . Difficulty walking 05/01/2016  . Neuromyopathy ([place]) 05/01/2016  . 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 Acoustic neuroma ([place]) 03/14/2015  . 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 H/O arthrodesis 02/17/2014  . Paraparesis ([place]) 02/17/2014  . Kidney lump 07/03/2012  . Neurofibroma of multiple sites [facility]) 09/23/2011    Past Surgical History:  Procedure Laterality Date  . brain bleed     mri 2009  . HAND EXPLORATION    . IR GASTROSTOMY TUBE MOD SED  09/05/2020  . LUMBAR FUSION    . NEPHRECTOMY     Partial left nephrectomy   . SPINAL FUSION         No family history on file.  Social History   Tobacco Use  . Smoking status: Never Smoker  . Smokeless tobacco: Never Used  Vaping Use  . Vaping Use: Never used  Substance Use Topics  .  Alcohol use: Not Currently    Alcohol/week: 0.0 standard drinks  . Drug use: Never    Home Medications Prior to Admission medications   Medication Sig Start Date End Date Taking? Authorizing Provider  albuterol (VENTOLIN HFA) 108 (90 Base) MCG/ACT inhaler Inhale 1-2 puffs into the lungs every 6 (six) hours as needed for wheezing or shortness of breath. 09/08/20 10/08/20 Yes Antonieta Pert, MD  ammonium lactate (AMLACTIN) 12 % lotion Apply 1 application topically as needed for dry skin. 06/28/20  Yes Felipa Furnace, DPM  aspirin 81 MG chewable tablet Place 1  tablet (81 mg total) into feeding tube daily. 09/08/20 10/08/20 Yes Antonieta Pert, MD  carboxymethylcellulose (REFRESH TEARS) 0.5 % SOLN Place 1 drop into both eyes daily as needed (For dry eyes).   Yes [provider]  ferrous sulfate 300 (60 Fe) MG/5ML syrup Place 5 mLs (300 mg total) into feeding tube daily with breakfast. 09/08/20  Yes Kc, Maren Beach, MD  levETIRAcetam (KEPPRA) 100 MG/ML solution Place 15 mLs (1,500 mg total) into feeding tube 2 (two) times daily. 09/08/20 11/07/20 Yes Antonieta Pert, MD  Nutritional Supplements (FEEDING SUPPLEMENT, OSMOLITE 1.5 CAL,) LIQD Place 360 mLs into feeding tube 4 (four) times daily for 28 days. 09/08/20 10/06/20 Yes Antonieta Pert, MD  Nutritional Supplements (FEEDING SUPPLEMENT, PROSOURCE TF,) liquid Place 45 mLs into feeding tube daily. 09/08/20 10/08/20 Yes Antonieta Pert, MD  Water For Irrigation, Sterile (FREE WATER) SOLN Place 200 mLs into feeding tube 4 (four) times daily. 09/08/20  Yes Kc, Maren Beach, MD  brigatinib (ALUNBRIG) 90 & 180 MG TBPK Take 90-180 mg by mouth daily. Check with your physician at [facility] about when to resume this medication Patient not taking: Reported on 10/16/2020 09/08/20   Antonieta Pert, MD  dexamethasone (DECADRON) 2 MG tablet 1 tablet (2 mg total) by Per J Tube route daily. HOLD IT WHILE HE IS GETTING PREDNISONE 09/08/20   Antonieta Pert, MD  pantoprazole sodium (PROTONIX) 40 mg/20 mL PACK Place 20 mLs (40 mg total) into feeding tube at bedtime. 09/08/20 11/07/20  Antonieta Pert, MD  predniSONE (DELTASONE) 20 MG tablet Take VIA peg TUBE 4 tabs daily x 3 days,3 tabs daily x 3 days,2 tabs daily x 3 days,1 tab daily x 3 days then stop. 09/08/20   Antonieta Pert, MD    Allergies    Amoxicillin and Morphine  Review of Systems   Review of Systems  Unable to perform ROS: Acuity of condition    Physical Exam Updated Vital Signs BP (!) 76/45   Pulse 79   Temp (!) 95.7 F (35.4 C) (Rectal)   Resp 18   Ht 5\' 10"  (1.778 m)   Wt 72.4 kg    SpO2 95%   BMI 22.90 kg/m   Physical Exam Vitals and nursing note reviewed.  Constitutional:      General: He is in acute distress.     Appearance: He is well-developed. He is ill-appearing, toxic-appearing and diaphoretic.  HENT:     Head: Normocephalic and atraumatic.     Right Ear: External ear normal.     Left Ear: External ear normal.     Mouth/Throat:     Mouth: Mucous membranes are dry.  Eyes:     Conjunctiva/sclera: Conjunctivae normal.     Pupils: Pupils are equal, round, and reactive to light.  Neck:     Trachea: Phonation normal.  Cardiovascular:     Rate and Rhythm: Normal rate and regular rhythm.  Heart sounds: Normal heart sounds.  Pulmonary:     Effort: Pulmonary effort is normal. No respiratory distress.     Breath sounds: Normal breath sounds. No wheezing or rhonchi.  Abdominal:     General: There is no distension.     Palpations: Abdomen is soft. There is no mass.     Tenderness: There is no abdominal tenderness.     Comments: G-tube left upper quadrant, site and appliance appear normal  Genitourinary:    Comments: Normal anus.  Some small brown stool in the rectal vault without rectal mass or tenderness. Musculoskeletal:        General: Normal range of motion.     Cervical back: Normal range of motion and neck supple. No rigidity.  Skin:    General: Skin is warm.     Coloration: Skin is not jaundiced or pale.  Neurological:     Mental Status: He is alert.     Cranial Nerves: No cranial nerve deficit.     Motor: No abnormal muscle tone.     Comments: Nonverbal, not cooperative, does not move extremities on command.     ED Results / Procedures / Treatments   Labs (all labs ordered are listed, but only abnormal results are displayed) Labs Reviewed  COMPREHENSIVE METABOLIC PANEL - Abnormal; Notable for the following components:      Result Value   Sodium 131 (*)    Glucose, Bld 108 (*)    BUN 37 (*)    Creatinine, Ser <0.30 (*)    Calcium 7.2  (*)    Total Protein 3.9 (*)    Albumin 1.7 (*)    ALT 94 (*)    Total Bilirubin 0.2 (*)    Anion gap 3 (*)    All other components within normal limits  CBC WITH DIFFERENTIAL/PLATELET - Abnormal; Notable for the following components:   RBC 2.22 (*)    Hemoglobin 6.2 (*)    HCT 19.6 (*)    RDW 19.9 (*)    Platelets 78 (*)    nRBC 1.0 (*)    Abs Immature Granulocytes 0.85 (*)    All other components within normal limits  URINALYSIS, ROUTINE W REFLEX MICROSCOPIC - Abnormal; Notable for the following components:   APPearance TURBID (*)    Hgb urine dipstick SMALL (*)    Protein, ur 100 (*)    Nitrite POSITIVE (*)    Leukocytes,Ua LARGE (*)    WBC, UA >50 (*)    Bacteria, UA FEW (*)    Non Squamous Epithelial 6-10 (*)    All other components within normal limits  BLOOD GAS, ARTERIAL - Abnormal; Notable for the following components:   pCO2 arterial 49.8 (*)    Acid-Base Excess 2.4 (*)    All other components within normal limits  POC OCCULT BLOOD, ED - Abnormal; Notable for the following components:   Fecal Occult Bld POSITIVE (*)    All other components within normal limits  RESPIRATORY PANEL BY RT PCR (FLU A&B, COVID)  CULTURE, BLOOD (ROUTINE X 2)  CULTURE, BLOOD (ROUTINE X 2)  URINE CULTURE  LACTIC ACID, PLASMA  LACTIC ACID, PLASMA  PROTIME-INR  APTT  CBG MONITORING, ED  TYPE AND SCREEN  PREPARE RBC (CROSSMATCH)    EKG EKG Interpretation  Date/Time:  Monday September 18 2020 17:40:53 EDT Ventricular Rate:  63 PR Interval:    QRS Duration: 105 QT Interval:  438 QTC Calculation: 449 R Axis:   79 Text Interpretation:  Sinus rhythm Nonspecific T abnormalities, lateral leads Since last tracing t wave abnormality is new Otherwise no significant change Confirmed by Daleen Bo [phone]) on 10/09/2020 5:49:07 PM   Radiology CT Head Wo Contrast  Result Date: 10/01/2020 CLINICAL DATA:  Hypotension.  Mental status changes. EXAM: CT HEAD WITHOUT CONTRAST TECHNIQUE:  Contiguous axial images were obtained from the base of the skull through the vertex without intravenous contrast. COMPARISON:  08/14/2020 FINDINGS: Brain: Again noted are multiple meningiomas compatible with meningiomatosis, unchanged since prior study. Bifrontal edema surrounding the large para fall seen meningioma, unchanged. No acute infarction or hemorrhage visualized. No hydrocephalus. Vascular: No hyperdense vessel or unexpected calcification. Skull: Prior craniotomy defects.  No acute calvarial abnormality. Sinuses/Orbits: No acute finding Other: None IMPRESSION: Extensive meningiomatosis again noted, unchanged. Bifrontal edema is also a stable. No acute intracranial abnormality. Electronically Signed   By: Rolm Baptise M.D.   On: 10/03/2020 22:57   DG Chest Port 1 View  Result Date: 09/26/2020 CLINICAL DATA:  Possible sepsis. EXAM: PORTABLE CHEST 1 VIEW COMPARISON:  Chest x-ray dated September 04, 2020. FINDINGS: Interval removal of the right PICC line. The heart size and mediastinal contours are within normal limits. Normal pulmonary vascularity. Mild medial bibasilar atelectasis. No focal consolidation, pleural effusion, or pneumothorax. No acute osseous abnormality. IMPRESSION: No active disease. Electronically Signed   By: Titus Dubin M.D.   On: 09/30/2020 18:24    Procedures .Critical Care Performed by: Daleen Bo, MD Authorized by: Daleen Bo, MD   Critical care provider statement:    Critical care time (minutes):  105   Critical care start time:  09/30/2020 5:20 PM   Critical care end time:  10/15/2020 11:48 PM   Critical care time was exclusive of:  Separately billable procedures and treating other patients   Critical care was necessary to treat or prevent imminent or life-threatening deterioration of the following conditions:  Sepsis   Critical care was time spent personally by me on the following activities:  Blood draw for specimens, development of treatment plan with  patient or surrogate, discussions with consultants, evaluation of patient's response to treatment, examination of patient, obtaining history from patient or surrogate, ordering and performing treatments and interventions, ordering and review of laboratory studies, pulse oximetry, re-evaluation of patient's condition, review of old charts and ordering and review of radiographic studies   (including critical care time)  Medications Ordered in ED Medications  lactated ringers infusion ( Intravenous New Bag/Given 10/10/2020 1831)  ceFEPIme (MAXIPIME) 2 g in sodium chloride 0.9 % 100 mL IVPB (0 g Intravenous Stopped 10/04/2020 1954)  vancomycin (VANCOREADY) IVPB 750 mg/150 mL (has no administration in time range)  metroNIDAZOLE (FLAGYL) IVPB 500 mg (0 mg Intravenous Stopped 10/09/2020 1949)  lactated ringers bolus 1,000 mL (0 mLs Intravenous Stopped 10/08/2020 1906)    And  lactated ringers bolus 1,000 mL (0 mLs Intravenous Stopped 10/02/2020 1949)    And  lactated ringers bolus 250 mL (0 mLs Intravenous Stopped 10/13/2020 2037)  vancomycin (VANCOREADY) IVPB 1500 mg/300 mL (1,500 mg Intravenous New Bag/Given 10/10/2020 2034)  0.9 %  sodium chloride infusion (0 mL/hr Intravenous Stopped 10/02/2020 2158)  lactated ringers bolus 1,000 mL (0 mLs Intravenous Stopped 10/03/2020 2347)    ED Course  I have reviewed the triage vital signs and the nursing notes.  Pertinent labs & imaging results that were available during my care of the patient were reviewed by me and considered in my medical decision making (see chart for details).  Clinical Course as of Sep 18 2353  Molli Knock Sep 18, 2020  1810 I was able to reach the patient's wife Johny Chess, on the telephone.  She states that she was not with him today because she was at work.  The patient's mother was with him and reported to [facility] that that patient had been sleepy and had low blood pressure.  Therefore EMS was summoned.  Johny Chess reports that the patient is full code.   [EW]    1847 Patient's mother is here now and get some more history that the patient has had decreased responsiveness over the last 2 days, and essentially not talking.  Today the patient was able to respond to his mother, and indicated that he wanted to see his doctor because he was having trouble talking.  She states that yesterday he saw his neurologist who is managing his neurofibromatosis tumors which are intracranial.  There are plans to do additional treatments however the mother does not know what they are.  She states that the patient has been doing fairly well up until 2 days ago since hospital discharge.   [EW]  1950 Bladder scan indicates greater than 700 cc in the urinary bladder   [EW]  2145 Abnormal, presence of blood, protein, nitrite, large leukocyte, white cells, bacteria, WBC clumps; urine culture ordered  Color, Urine: YELLOW [EW]  2146 Normal except sodium low, glucose high, BUN high, creatinine low, calcium low, total protein low, albumin low, ALT high, total bilirubin low  Comprehensive metabolic panel(!) [EW]  9390 Normal except PCO2 slightly elevated at 50  Blood gas, arterial(!) [EW]  2146 Normal except hemoglobin low, platelets low  CBC WITH DIFFERENTIAL(!!) [EW]  2147 No infiltrate or edema per radiology report.  DG Chest Port 1 View [EW]  2325 Per radiologist stable chronic findings of meningiomatosis  CT Head Wo Contrast [EW]  2328 Fecal blood occult test positive per nurse.   [EW]    Clinical Course User Index [EW] Daleen Bo, MD   MDM Rules/Calculators/A&P                           Patient Vitals for the past 24 hrs:  BP Temp Temp src Pulse Resp SpO2 Height Weight  09/27/2020 2350 (!) 76/45 (!) 95.7 F (35.4 C) Rectal 79 18 -- -- --  10/06/2020 2300 (!) 91/57 -- -- 81 20 95 % -- --  10/10/2020 2245 102/61 -- -- 94 20 95 % -- --  10/15/2020 2230 (!) 91/56 -- -- 79 19 94 % -- --  10/02/2020 2200 (!) 84/59 (!) 95.7 F (35.4 C) Rectal 78 18 94 % -- --  09/29/2020 2145  (!) 88/53 -- -- 79 20 93 % -- --  09/20/2020 2115 (!) 85/58 -- -- 75 20 93 % -- --  10/10/2020 2100 (!) 89/55 -- -- 74 19 (!) 85 % -- --  09/30/2020 2030 (!) 84/56 -- -- 73 18 94 % -- --  10/12/2020 2000 97/65 -- -- 76 17 98 % -- --  10/13/2020 1928 106/76 (!) 93.1 F (33.9 C) Rectal 72 16 98 % -- --  10/17/2020 1913 90/63 -- -- 68 15 99 % -- --  10/07/2020 1830 -- -- -- 66 14 99 % -- --  10/09/2020 1822 -- -- -- -- -- -- 5\' 10"  (1.778 m) 72.4 kg  09/26/2020 1800 -- -- -- 64 15 100 % -- --  09/26/2020 1730 (!) 77/62 -- --  64 16 100 % -- --  09/24/2020 1724 (!) 84/52 -- -- 64 18 99 % -- --  09/30/2020 1722 -- -- -- -- -- -- 5\' 10"  (1.778 m) --    11:49 PM Reevaluation with update and discussion. After initial assessment and treatment, an updated evaluation reveals clinically unchanged status, patient's mother updated on findings and plan. Daleen Bo   Medical Decision Making:  This patient is presenting for evaluation of altered mental status, which does require a range of treatment options, and is a complaint that involves a high risk of morbidity and mortality. The differential diagnoses include worsening intracranial neurofibromatosis, acute illness, metabolic disorder, hypoglycemia, sepsis. I decided to review old records, and in summary patient with quadriplegia secondary to intracranial neurofibromatosis, as well as spinal tumor, presenting with altered mental status for 2 days.  Hypothermic on arrival, with concern for sepsis.  Sepsis bundle started.  I obtained additional historical information from mother at bedside, who lives with the patient.  Clinical Laboratory Tests Ordered, included CBC, Metabolic panel, Urinalysis and Lactate, blood cultures, blood type and crossmatch. Review indicates urinalysis consistent with infection, lactate normal.  Arterial blood gas normal, except mild elevation PCO2.. Radiologic Tests Ordered, included chest x-ray, CT head.  I independently Visualized: Radiologic  images, which show no acute abnormalities per radiology report  Cardiac Monitor Tracing which shows sinus rhythm    Critical Interventions-clinical evaluation, laboratory testing, radiographic imaging, empiric antibiotics ordered, warming blanket observation and reassessment  After These Interventions, the Patient was reevaluated and was found improved temperature, and blood pressure.  Evaluation consistent with urinary tract infection, and anemia with GI bleeding.  He requires hospitalization for further treatment.  Patient with apparent stable findings of central nervous system neurofibromatosis.  This has affected his mentation and movement.  Patient will likely benefit from consultation with palliative care.  He is currently being managed at home.  CRITICAL CARE- yes Performed by: Daleen Bo  Nursing Notes Reviewed/ Care Coordinated Applicable Imaging Reviewed Interpretation of Laboratory Data incorporated into ED treatment   11:35 PM-Consult complete with Hospitalist. Patient case explained and discussed.  He agrees to admit patient for further evaluation and treatment. Call ended at 11:54 PM  Plan: Admit    Final Clinical Impression(s) / ED Diagnoses Final diagnoses:  Sepsis with acute organ dysfunction without septic shock, due to unspecified organism, unspecified type ([place])  Urinary tract infection without hematuria, site unspecified  Anemia, unspecified type  Rectal bleeding    Rx / DC Orders ED Discharge Orders    None       Daleen Bo, MD 10/04/2020 2354

## 2020-09-18 NOTE — ED Triage Notes (Signed)
EMS reports pt has had hypotension and decreased LOC today.  Reports bp was 75/40's.  EMS says parents report pt has been lethargic all day.  EMS says pt was unresponsive to a sternal rub initially but became a little responsive when they moved him.   Reports pt has history of NF2.

## 2020-09-18 NOTE — Progress Notes (Signed)
Pharmacy Antibiotic Note  Cameron Hale is a 44 y.o. male admitted on 10/15/2020 with sepsis of unknown origin. Pharmacy has been consulted for vancomycin and cefepime dosing.  WBC 9.6, lactic acid; hypotensive (SBP 77-84; DBP 52-62)  Pt is a functional quadriplegic, due to neurologic decompensation following a surgical procedure in March 2021. He receives advanced nursing care at his home. Due to low muscle mass, his Scr (<0.3) is an underestimate of his renal function; for purposes of drug dosing, will round Scr to 1.0; CrCl ~95 ml/min.  Plan: Vancomycin 1500 mg IV X 1, followed by vancomycin 750 mg IV Q 8 hrs, per [facility] vancomycin protocol (goal vancomycin trough: 15-20 mg/L) Cefepime 2 gm IV Q 8 hrs Monitor WBC, temp, clinical improvement, cultures, renal function, vancomycin levels   Height: 5\' 10"  (177.8 cm) IBW/kg (Calculated) : 73   Allergies  Allergen Reactions  . Amoxicillin Itching  . Morphine Itching    Antimicrobials this admission: 11/1 Metronidazole  X 1 11/1 Vancomycin 11/1 Cefepime  Microbiology results: 11/1 Respiratory panel: pending 1/1 Urine Cx: pending  Thank you for allowing pharmacy to be a part of this patient's care.  Gillermina Hu, PharmD, BCPS, [facility] Clinical Pharmacist 10/10/2020 6:12 PM

## 2020-09-18 NOTE — Sepsis Progress Note (Signed)
Sepsis protocol being followed by [facility] 

## 2020-09-19 ENCOUNTER — Inpatient Hospital Stay (HOSPITAL_COMMUNITY): Payer: Medicare Other

## 2020-09-19 DIAGNOSIS — A419 Sepsis, unspecified organism: Secondary | ICD-10-CM | POA: Diagnosis not present

## 2020-09-19 DIAGNOSIS — D649 Anemia, unspecified: Secondary | ICD-10-CM

## 2020-09-19 DIAGNOSIS — K922 Gastrointestinal hemorrhage, unspecified: Secondary | ICD-10-CM

## 2020-09-19 DIAGNOSIS — R6521 Severe sepsis with septic shock: Secondary | ICD-10-CM

## 2020-09-19 DIAGNOSIS — J9602 Acute respiratory failure with hypercapnia: Secondary | ICD-10-CM

## 2020-09-19 DIAGNOSIS — J9600 Acute respiratory failure, unspecified whether with hypoxia or hypercapnia: Secondary | ICD-10-CM | POA: Diagnosis present

## 2020-09-19 DIAGNOSIS — G8250 Quadriplegia, unspecified: Secondary | ICD-10-CM

## 2020-09-19 LAB — CBC WITH DIFFERENTIAL/PLATELET
Abs Immature Granulocytes: 1.15 10*3/uL — ABNORMAL HIGH (ref 0.00–0.07)
Basophils Absolute: 0.1 10*3/uL (ref 0.0–0.1)
Basophils Relative: 2 %
Eosinophils Absolute: 0 10*3/uL (ref 0.0–0.5)
Eosinophils Relative: 0 %
HCT: 30.6 % — ABNORMAL LOW (ref 39.0–52.0)
Hemoglobin: 9.4 g/dL — ABNORMAL LOW (ref 13.0–17.0)
Immature Granulocytes: 26 %
Lymphocytes Relative: 14 %
Lymphs Abs: 0.6 10*3/uL — ABNORMAL LOW (ref 0.7–4.0)
MCH: 27.9 pg (ref 26.0–34.0)
MCHC: 30.7 g/dL (ref 30.0–36.0)
MCV: 90.8 fL (ref 80.0–100.0)
Monocytes Absolute: 0.2 10*3/uL (ref 0.1–1.0)
Monocytes Relative: 4 %
Neutro Abs: 2.5 10*3/uL (ref 1.7–7.7)
Neutrophils Relative %: 54 %
Platelets: 88 10*3/uL — ABNORMAL LOW (ref 150–400)
RBC: 3.37 MIL/uL — ABNORMAL LOW (ref 4.22–5.81)
RDW: 18.3 % — ABNORMAL HIGH (ref 11.5–15.5)
WBC: 4.5 10*3/uL (ref 4.0–10.5)
nRBC: 2.5 % — ABNORMAL HIGH (ref 0.0–0.2)

## 2020-09-19 LAB — BLOOD GAS, ARTERIAL
Acid-base deficit: 0.2 mmol/L (ref 0.0–2.0)
Acid-base deficit: 0.4 mmol/L (ref 0.0–2.0)
Bicarbonate: 23.2 mmol/L (ref 20.0–28.0)
Bicarbonate: 24 mmol/L (ref 20.0–28.0)
Drawn by: 35043
FIO2: 28
FIO2: 100
MECHVT: 580 mL
O2 Saturation: 94.3 %
O2 Saturation: 96.6 %
PEEP: 5 cmH2O
Patient temperature: 37
Patient temperature: 37
RATE: 16 resp/min
pCO2 arterial: 69.2 mmHg (ref 32.0–48.0)
pCO2 arterial: 40.9 mmHg (ref 32.0–48.0)
pH, Arterial: 7.212 — ABNORMAL LOW (ref 7.350–7.450)
pH, Arterial: 7.386 (ref 7.350–7.450)
pO2, Arterial: 80.4 mmHg — ABNORMAL LOW (ref 83.0–108.0)
pO2, Arterial: 85.9 mmHg (ref 83.0–108.0)

## 2020-09-19 LAB — BLOOD CULTURE ID PANEL (REFLEXED) - BCID2

## 2020-09-19 LAB — COMPREHENSIVE METABOLIC PANEL
ALT: 95 U/L — ABNORMAL HIGH (ref 0–44)
AST: 42 U/L — ABNORMAL HIGH (ref 15–41)
Albumin: 1.8 g/dL — ABNORMAL LOW (ref 3.5–5.0)
Alkaline Phosphatase: 78 U/L (ref 38–126)
Anion gap: 14 (ref 5–15)
BUN: 30 mg/dL — ABNORMAL HIGH (ref 6–20)
CO2: 22 mmol/L (ref 22–32)
Calcium: 7.7 mg/dL — ABNORMAL LOW (ref 8.9–10.3)
Chloride: 104 mmol/L (ref 98–111)
Creatinine, Ser: <0.3 mg/dL — ABNORMAL LOW (ref 0.61–1.24)
Glucose, Bld: 55 mg/dL — ABNORMAL LOW (ref 70–99)
Potassium: 5 mmol/L (ref 3.5–5.1)
Sodium: 140 mmol/L (ref 135–145)
Total Bilirubin: 0.6 mg/dL (ref 0.3–1.2)
Total Protein: 4.2 g/dL — ABNORMAL LOW (ref 6.5–8.1)

## 2020-09-19 LAB — PHOSPHORUS
Phosphorus: 2.4 mg/dL — ABNORMAL LOW (ref 2.5–4.6)
Phosphorus: 3.1 mg/dL (ref 2.5–4.6)
Phosphorus: 3.2 mg/dL (ref 2.5–4.6)

## 2020-09-19 LAB — MAGNESIUM
Magnesium: 2 mg/dL (ref 1.7–2.4)
Magnesium: 1.8 mg/dL (ref 1.7–2.4)
Magnesium: 1.9 mg/dL (ref 1.7–2.4)

## 2020-09-19 LAB — GLUCOSE, CAPILLARY
Glucose-Capillary: 119 mg/dL — ABNORMAL HIGH (ref 70–99)
Glucose-Capillary: 56 mg/dL — ABNORMAL LOW (ref 70–99)
Glucose-Capillary: 66 mg/dL — ABNORMAL LOW (ref 70–99)
Glucose-Capillary: 96 mg/dL (ref 70–99)
Glucose-Capillary: 93 mg/dL (ref 70–99)
Glucose-Capillary: 95 mg/dL (ref 70–99)

## 2020-09-19 LAB — MRSA PCR SCREENING: MRSA by PCR: NEGATIVE

## 2020-09-19 LAB — TRIGLYCERIDES: Triglycerides: 142 mg/dL (ref <150)

## 2020-09-19 MED ORDER — PROPOFOL 1000 MG/100ML IV EMUL: 5.0000 ug/kg/min | INTRAVENOUS | Status: DC

## 2020-09-19 MED ORDER — PHENYLEPHRINE 40 MCG/ML (10ML) SYRINGE FOR IV PUSH (FOR BLOOD PRESSURE SUPPORT)
PREFILLED_SYRINGE | INTRAVENOUS | Status: AC
  Filled 2020-09-19: qty 10

## 2020-09-19 MED ORDER — ORAL CARE MOUTH RINSE
15.0000 mL | OROMUCOSAL | Status: DC
  Administered 2020-09-19 – 2020-10-10 (×200): 15 mL via OROMUCOSAL

## 2020-09-19 MED ORDER — SUCCINYLCHOLINE CHLORIDE 20 MG/ML IJ SOLN
150.0000 mg | Freq: Once | INTRAMUSCULAR | Status: AC
  Administered 2020-09-19: 150 mg via INTRAVENOUS

## 2020-09-19 MED ORDER — NOREPINEPHRINE 4 MG/250ML-% IV SOLN
INTRAVENOUS | Status: AC
  Administered 2020-09-19: 2 ug/min via INTRAVENOUS
  Filled 2020-09-19: qty 250

## 2020-09-19 MED ORDER — MIDODRINE HCL 5 MG PO TABS
10.0000 mg | ORAL_TABLET | Freq: Three times a day (TID) | ORAL | Status: DC
  Administered 2020-09-19 – 2020-09-25 (×16): 10 mg
  Filled 2020-09-19 (×17): qty 2

## 2020-09-19 MED ORDER — HYDROCORTISONE NA SUCCINATE PF 100 MG IJ SOLR
100.0000 mg | Freq: Three times a day (TID) | INTRAMUSCULAR | Status: DC
  Administered 2020-09-19 – 2020-09-21 (×8): 100 mg via INTRAVENOUS
  Filled 2020-09-19 (×9): qty 2

## 2020-09-19 MED ORDER — AMMONIUM LACTATE 12 % EX LOTN: 1.0000 "application " | TOPICAL_LOTION | CUTANEOUS | Status: DC | PRN

## 2020-09-19 MED ORDER — SODIUM CHLORIDE 3 % IN NEBU
4.0000 mL | INHALATION_SOLUTION | Freq: Two times a day (BID) | RESPIRATORY_TRACT | Status: AC
  Administered 2020-09-19 – 2020-09-20 (×4): 4 mL via RESPIRATORY_TRACT
  Filled 2020-09-19 (×4): qty 4

## 2020-09-19 MED ORDER — CHLORHEXIDINE GLUCONATE 0.12 % MT SOLN
OROMUCOSAL | Status: AC
  Administered 2020-09-19: 15 mL
  Filled 2020-09-19: qty 15

## 2020-09-19 MED ORDER — ASCORBIC ACID 500 MG PO TABS
250.0000 mg | ORAL_TABLET | Freq: Every day | ORAL | Status: DC
  Administered 2020-09-19 – 2020-10-10 (×22): 250 mg
  Filled 2020-09-19 (×22): qty 1

## 2020-09-19 MED ORDER — DEXTROSE 50 % IV SOLN
12.5000 g | Freq: Once | INTRAVENOUS | Status: AC
  Administered 2020-09-19: 12.5 g via INTRAVENOUS

## 2020-09-19 MED ORDER — FENTANYL 2500MCG IN NS 250ML (10MCG/ML) PREMIX INFUSION
50.0000 ug/h | INTRAVENOUS | Status: DC
  Administered 2020-09-19: 50 ug/h via INTRAVENOUS
  Filled 2020-09-19: qty 250

## 2020-09-19 MED ORDER — IPRATROPIUM-ALBUTEROL 0.5-2.5 (3) MG/3ML IN SOLN
3.0000 mL | Freq: Four times a day (QID) | RESPIRATORY_TRACT | Status: DC
  Administered 2020-09-19 – 2020-10-05 (×65): 3 mL via RESPIRATORY_TRACT
  Filled 2020-09-19 (×62): qty 3

## 2020-09-19 MED ORDER — LACTATED RINGERS IV BOLUS
500.0000 mL | Freq: Once | INTRAVENOUS | Status: AC
  Administered 2020-09-19: 500 mL via INTRAVENOUS

## 2020-09-19 MED ORDER — DOCUSATE SODIUM 50 MG/5ML PO LIQD
100.0000 mg | Freq: Two times a day (BID) | ORAL | Status: DC
  Filled 2020-09-19 (×3): qty 10

## 2020-09-19 MED ORDER — SODIUM CHLORIDE 0.9 % IV SOLN
2.0000 g | Freq: Three times a day (TID) | INTRAVENOUS | Status: AC
  Administered 2020-09-19 – 2020-09-27 (×26): 2 g via INTRAVENOUS
  Filled 2020-09-19 (×28): qty 2

## 2020-09-19 MED ORDER — ONDANSETRON HCL 4 MG/2ML IJ SOLN: 4.0000 mg | Freq: Four times a day (QID) | INTRAMUSCULAR | Status: DC | PRN

## 2020-09-19 MED ORDER — FENTANYL BOLUS VIA INFUSION
50.0000 ug | INTRAVENOUS | Status: DC | PRN
  Filled 2020-09-19: qty 50

## 2020-09-19 MED ORDER — FENTANYL CITRATE (PF) 100 MCG/2ML IJ SOLN
50.0000 ug | Freq: Once | INTRAMUSCULAR | Status: AC
  Administered 2020-09-19: 50 ug via INTRAVENOUS
  Filled 2020-09-19: qty 2

## 2020-09-19 MED ORDER — ACETAMINOPHEN 325 MG PO TABS: 650.0000 mg | ORAL_TABLET | Freq: Four times a day (QID) | ORAL | Status: DC | PRN

## 2020-09-19 MED ORDER — METRONIDAZOLE IN NACL 5-0.79 MG/ML-% IV SOLN
500.0000 mg | Freq: Three times a day (TID) | INTRAVENOUS | Status: DC
  Administered 2020-09-19 – 2020-09-20 (×4): 500 mg via INTRAVENOUS
  Filled 2020-09-19 (×4): qty 100

## 2020-09-19 MED ORDER — HEPARIN SODIUM (PORCINE) 5000 UNIT/ML IJ SOLN
5000.0000 [IU] | Freq: Three times a day (TID) | INTRAMUSCULAR | Status: DC
Start: 2020-09-19 — End: 2020-09-19

## 2020-09-19 MED ORDER — FERROUS SULFATE 325 (65 FE) MG PO TABS: 325.0000 mg | ORAL_TABLET | Freq: Every day | ORAL | Status: DC

## 2020-09-19 MED ORDER — DEXTROSE 50 % IV SOLN
INTRAVENOUS | Status: AC
  Filled 2020-09-19: qty 50

## 2020-09-19 MED ORDER — ETOMIDATE 2 MG/ML IV SOLN
15.0000 mg | Freq: Once | INTRAVENOUS | Status: AC
  Administered 2020-09-19: 15 mg via INTRAVENOUS

## 2020-09-19 MED ORDER — CHLORHEXIDINE GLUCONATE 0.12% ORAL RINSE (MEDLINE KIT)
15.0000 mL | Freq: Two times a day (BID) | OROMUCOSAL | Status: DC
  Administered 2020-09-19 – 2020-10-10 (×41): 15 mL via OROMUCOSAL

## 2020-09-19 MED ORDER — NOREPINEPHRINE 4 MG/250ML-% IV SOLN: 0.0000 ug/min | INTRAVENOUS | Status: DC

## 2020-09-19 MED ORDER — CHLORHEXIDINE GLUCONATE CLOTH 2 % EX PADS
6.0000 | MEDICATED_PAD | Freq: Every day | CUTANEOUS | Status: DC
  Administered 2020-09-19 – 2020-10-10 (×21): 6 via TOPICAL

## 2020-09-19 MED ORDER — POLYETHYLENE GLYCOL 3350 17 G PO PACK: 17.0000 g | PACK | Freq: Every day | ORAL | Status: DC

## 2020-09-19 MED ORDER — PROSOURCE TF PO LIQD
45.0000 mL | Freq: Two times a day (BID) | ORAL | Status: DC
  Administered 2020-09-19 – 2020-09-20 (×3): 45 mL
  Filled 2020-09-19 (×3): qty 45

## 2020-09-19 MED ORDER — ONDANSETRON HCL 4 MG PO TABS: 4.0000 mg | ORAL_TABLET | Freq: Four times a day (QID) | ORAL | Status: DC | PRN

## 2020-09-19 MED ORDER — VITAL HIGH PROTEIN PO LIQD
1000.0000 mL | ORAL | Status: AC
  Administered 2020-09-19: 1000 mL

## 2020-09-19 MED ORDER — ACETAMINOPHEN 650 MG RE SUPP: 650.0000 mg | Freq: Four times a day (QID) | RECTAL | Status: DC | PRN

## 2020-09-19 MED ORDER — MIDAZOLAM HCL 2 MG/2ML IJ SOLN
1.0000 mg | INTRAMUSCULAR | Status: DC | PRN
  Administered 2020-09-19 (×2): 1 mg via INTRAVENOUS
  Filled 2020-09-19 (×3): qty 2

## 2020-09-19 MED ORDER — LEVETIRACETAM IN NACL 1500 MG/100ML IV SOLN
1500.0000 mg | Freq: Two times a day (BID) | INTRAVENOUS | Status: DC
  Administered 2020-09-19 – 2020-09-20 (×4): 1500 mg via INTRAVENOUS
  Filled 2020-09-19 (×4): qty 100

## 2020-09-19 NOTE — ED Provider Notes (Signed)
I was asked to assess the patient by the admitting hospitalist.  Patient with increasing somnolence.  Repeat ABG with a pH of 7.21 and a CO2 of 69.  Acute respiratory failure and hypercarbia.  He is full code which is confirmed with family at the bedside.  Decision made to intubate given hemodynamic instability and progressive respiratory failure.  Discussed ongoing care needs and CODE STATUS with family at bedside.  They report that he would not want to be on a chronic ventilator; however, he would not want any chance to live.  I discussed with her along with Dr. Olevia Bowens that we do not know his prognosis for this current illness at this time although given his chronic and progressive debilitation, there is a chance that intubation may be terminal.  Family would like to proceed with intubation.   Physical Exam  BP (!) 86/49 (BP Location: Left Arm)    Pulse 92    Temp (!) 97.2 F (36.2 C) (Rectal)    Resp 20    Ht 1.778 m (5\' 10" )    Wt 72.4 kg    SpO2 92%    BMI 22.90 kg/m   Physical Exam  ED Course/Procedures   Clinical Course as of Sep 19 158  [facility] Sep 18, 2020  1810 I was able to reach the patient's wife Johny Chess, on the telephone.  She states that she was not with him today because she was at work.  The patient's mother was with him and reported to [facility] that that patient had been sleepy and had low blood pressure.  Therefore EMS was summoned.  Johny Chess reports that the patient is full code.   [EW]  1847 Patient's mother is here now and get some more history that the patient has had decreased responsiveness over the last 2 days, and essentially not talking.  Today the patient was able to respond to his mother, and indicated that he wanted to see his doctor because he was having trouble talking.  She states that yesterday he saw his neurologist who is managing his neurofibromatosis tumors which are intracranial.  There are plans to do additional treatments however the mother does not know what  they are.  She states that the patient has been doing fairly well up until 2 days ago since hospital discharge.   [EW]  1950 Bladder scan indicates greater than 700 cc in the urinary bladder   [EW]  2145 Abnormal, presence of blood, protein, nitrite, large leukocyte, white cells, bacteria, WBC clumps; urine culture ordered  Color, Urine: YELLOW [EW]  2146 Normal except sodium low, glucose high, BUN high, creatinine low, calcium low, total protein low, albumin low, ALT high, total bilirubin low  Comprehensive metabolic panel(!) [EW]  7510 Normal except PCO2 slightly elevated at 50  Blood gas, arterial(!) [EW]  2146 Normal except hemoglobin low, platelets low  CBC WITH DIFFERENTIAL(!!) [EW]  2147 No infiltrate or edema per radiology report.  DG Chest Port 1 View [EW]  2325 Per radiologist stable chronic findings of meningiomatosis  CT Head Wo Contrast [EW]  2328 Fecal blood occult test positive per nurse.   [EW]    Clinical Course User Index [EW] Daleen Bo, MD    Procedure Name: Intubation Date/Time: 09/19/2020 2:44 AM Performed by: Merryl Hacker, MD Pre-anesthesia Checklist: Patient identified, Patient being monitored, Emergency Drugs available, Timeout performed and Suction available Oxygen Delivery Method: Non-rebreather mask Preoxygenation: Pre-oxygenation with 100% oxygen Induction Type: Rapid sequence Ventilation: Mask ventilation without difficulty Laryngoscope  Size: Glidescope and 4 Grade View: Grade I Tube size: 7.5 mm Number of attempts: 1 Placement Confirmation: ETT inserted through vocal cords under direct vision,  CO2 detector and Breath sounds checked- equal and bilateral Secured at: 23 cm Tube secured with: ETT holder Dental Injury: Teeth and Oropharynx as per pre-operative assessment        MDM   Patient intubated for progressive hypercarbic respiratory failure.      Merryl Hacker, MD 09/19/20 [phone]

## 2020-09-19 NOTE — ED Notes (Signed)
Date and time results received: 09/19/20 0200  Test: pCO2 Critical Value: 69.2  Name of Provider Notified: Olevia Bowens  Orders Received? Or Actions Taken?: n/a

## 2020-09-19 NOTE — Progress Notes (Signed)
MD notified of BP mid 80s- orders received for 500 cc LR bolus now.

## 2020-09-19 NOTE — Consult Note (Signed)
NAME:  Cameron Hale, [name]:  [id number], DOB:  [birth year], LOS: 1 ADMISSION DATE:  10/04/2020, CONSULTATION DATE:  09/19/2020 REFERRING MD:  Dr. Dyann Kief, CHIEF COMPLAINT:  Sepsis   Brief History   44 yo Male hx of quadriplegia and recent intubation, admitted to [facility] on 11/1 for AMS and ARF with hypoxia requiring intubation.   History of present illness   44 yo male comes in to [facility] on 11/1 for progressively worsening AMS worsening the past few days. Patient was unresponsive to sternal rub by EMS. Hypotensive with a BP of 75/40. Hypothermia of 92. ABG 7.21 and PaCO2 of 69. Family reports that he would not want to be on a chronic ventilator, but they would like for him to be full code for now and are okay with intubation. Patient was intubated 11/2.   Patient was recently admitted from 07/19/2020-09/08/2020 at [facility] for ARF with hypoxia, generalized seizures, and AMS with lethargy. He was intubated and received PEG tube placement on this admission. Right sided chest tube times 2.  Patient is a functional quad, with neurologic decompensation since a surgical procedure (cervical laminectomy and tumor resection) in March 2021. He has coincidental hearing loss and blindness that occurred shortly after the surgery. [place] care at home.   Negative for N/V/D, fever per patient's mother.  CXR 11/2 shows substantial left lung collapse with mediastinal shift to the left. Possible mucus plug? Urinalysis indicative for UTI.  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 G-tube (09/05/2020)   Significant Hospital Events   Admitted to [facility] 11/1>>11/2 Transferred to [facility] 11/2>>  Consults:  PCCM  Procedures:  Intubated 11/2 >>  Significant Diagnostic Tests:  11/1 CT >> extensive meningiomatosis noted unchanged. Bifrontal edema is stable. No acute  intracranial abnormality.   Micro Data:  11/1 Flu >> negative 11/1 Covid >> negative 11/1 Blood culture >> 11/1 Urine culture >>  Antimicrobials:  11/1 Cefepime >> 11/2 Metronidazole >> 11/2 Vancomycin >>  Interim history/subjective:  11/2 patient transferred from [facility] to [facility] Intubated on mechanical ventilation: PRVC 580 X 16 100% +5 Tmax 98.4 Glucose range 55-114 U/O 1,000: Total I/O -1,170  Objective   Blood pressure 131/74, pulse 84, temperature 98.4 F (36.9 C), temperature source Axillary, resp. rate 16, height 5\' 10"  (1.778 m), weight 72.4 kg, SpO2 100 %.    Vent Mode: PRVC FiO2 (%):  [60 %-100 %] 100 % Set Rate:  [16 bmp] 16 bmp Vt Set:  [580 mL] 580 mL PEEP:  [5 cmH20] 5 cmH20 Plateau Pressure:  [18 cmH20] 18 cmH20   Intake/Output Summary (Last 24 hours) at 09/19/2020 0906 Last data filed at 09/19/2020 8938 Gross per 24 hour  Intake 630 ml  Output 900 ml  Net -270 ml   Filed Weights   10/10/2020 1822  Weight: 72.4 kg    Examination: General: NAD HEENT: MM pink/moist; scleral edema b/l; pupils 55mm non reactive to light; ET tube and OG tube in place Neuro: Not following commands CV: s1s2, rrr, no m/r/g PULM: Distant BS on left side; Diminished clear BS on right side; Thick yellow secretions suctioned GI: soft, bsx4 active, PEG tube in place Extremities: warm/dry, no edema  Skin: no rashes or lesions  Resolved Hospital Problem list     Assessment & Plan:  Sepsis due to undetermined organism [facility]), due to UTI Hypotension, AMS -Admit to ICU/inpatient. -  Continue IV fluids. -Continue Levophed as needed for MAP > 65 -Monitor intake and output. -Cefepime, Vancomycin, Flagyl per pharmacy. -Continue Hydrocortisone -Follow blood culture and sensitivity.  Acute respiratory failure  Left lung collapse/possible mucus plug due to neuromuscular weakness, made worse in setting of sepsis -intubated on mechanical ventilation 100% +5 -sedation  versed/fentanyl: RAS goal -1 to 0 -ABG analysis as needed -6-8 cc/kg Vt -O2 Sats 88%-94%; wean as tolerated -CPT Q6 with left side up -add hypertonic saline -Duoneb q6 -Tracheal aspirate culture ordered -Consider therapeutic bronch -Consider Trach since recent intubation? -Repeat CXR in morning  UTI -Urine culture pending -Cefepime, Vanc, Flagyl  Hypoglycemia -Glucose 55 this morning -continue tube feed through through G-tube -Recheck POCG to desired Glucose range, monitor q4h -Hydrocortisone -Consider dextrose if hypoglycemia persist  AMS (altered mental status), acute metabolic encephalopathy Possibly related to UTI and Sepsis -NF2 with quadriplegia. -Acute respiratory failure with hypoxia -GI bleed with symptomatic anemia. -UTI: urine culture pending  Positive fecal occult blood Symptomatic anemia Thombrocytopenia -positive fecal occult blood -2 units of blood given 11/1 for Hb 6.2 -Hb improved today; continue to monitor CBC; transfuse as needed -Protonix 40 mg IVP every 12 hours.  -NPO -Restarted home iron supplement -GI consulted  Severe protein-calorie malnutrition ([place]) -Consider starting tube feedings through PEG tube -Consult nutritional services. -Consider high protein supplement  Hypothermia Hyponatremia -resolved  Neurofibroma of multiple sites ([place]) Quadriplegia ([place]) -Consider Trach if failure to wean from ventilator -Consider palliative care consult.  Seizures ([place]) -Continue Keppra in IV form. -?consider consult neuro  Best practice:  Diet: NPO Pain/Anxiety/Delirium protocol (if indicated): Versed, fentanyl VAP protocol (if indicated): yes DVT prophylaxis: SCDs (Hb low and positive iFOB) GI prophylaxis: protonix Glucose control:  Mobility: bed rest Code Status: Full Family Communication: updated wife baseline Disposition:   Labs   CBC: Recent Labs  Lab 09/24/2020 1802 09/19/20 0332  WBC 5.2 4.5  NEUTROABS 3.2 2.5  HGB  6.2* 9.4*  HCT 19.6* 30.6*  MCV 88.3 90.8  PLT 78* 88*    Basic Metabolic Panel: Recent Labs  Lab 09/24/2020 1802 09/19/20 0332  NA 131* 140  K 4.3 5.0  CL 101 104  CO2 27 22  GLUCOSE 108* 55*  BUN 37* 30*  CREATININE <0.30* <0.30*  CALCIUM 7.2* 7.7*  MG 2.0  --   PHOS 2.4*  --    GFR: CrCl cannot be calculated (This lab value cannot be used to calculate CrCl because it is not a number: <0.30). Recent Labs  Lab 09/24/2020 1802 09/24/2020 1952 09/19/20 0332  WBC 5.2  --  4.5  LATICACIDVEN 0.9 1.2  --     Liver Function Tests: Recent Labs  Lab 09/26/2020 1802 09/19/20 0332  AST 40 42*  ALT 94* 95*  ALKPHOS 85 78  BILITOT 0.2* 0.6  PROT 3.9* 4.2*  ALBUMIN 1.7* 1.8*   No results for input(s): LIPASE, AMYLASE in the last 168 hours. No results for input(s): AMMONIA in the last 168 hours.  ABG    Component Value Date/Time   PHART 7.386 09/19/2020 0410   PCO2ART 40.9 09/19/2020 0410   PO2ART 85.9 09/19/2020 0410   HCO3 24.0 09/19/2020 0410   TCO2 28 08/22/2020 1854   ACIDBASEDEF 0.4 09/19/2020 0410   O2SAT 96.6 09/19/2020 0410     Coagulation Profile: Recent Labs  Lab 09/23/2020 1802  INR 1.0    Cardiac Enzymes: No results for input(s): CKTOTAL, CKMB, CKMBINDEX, TROPONINI in the last 168 hours.  HbA1C: Hgb  A1c MFr Bld  Date/Time Value Ref Range Status  08/14/2020 04:55 AM 6.1 (H) 4.8 - 5.6 % Final    Comment:    (NOTE) Pre diabetes:          5.7%-6.4%  Diabetes:              >6.4%  Glycemic control for   <7.0% adults with diabetes     CBG: Recent Labs  Lab 10/03/2020 1801  GLUCAP 97    Review of Systems:   Intubated  Past Medical History  He,  has a past medical history of Hard of hearing, NF2 (neurofibromatosis 2) ([place]), Pressure ulcer, Quadriplegia ([place]), Seizures ([place]), and Vestibular schwannoma ([place]).   Surgical History    Past Surgical History:  Procedure Laterality Date  . brain bleed     mri 2009  . HAND EXPLORATION    . IR  GASTROSTOMY TUBE MOD SED  09/05/2020  . LUMBAR FUSION    . NEPHRECTOMY     Partial left nephrectomy   . SPINAL FUSION       Social History   reports that he has never smoked. He has never used smokeless tobacco. He reports previous alcohol use. He reports that he does not use drugs.   Family History   His family history is not on file.   Allergies Allergies  Allergen Reactions  . Amoxicillin Itching  . Morphine Itching     Home Medications  Prior to Admission medications   Medication Sig Start Date End Date Taking? Authorizing Provider  albuterol (VENTOLIN HFA) 108 (90 Base) MCG/ACT inhaler Inhale 1-2 puffs into the lungs every 6 (six) hours as needed for wheezing or shortness of breath. 09/08/20 10/08/20 Yes Antonieta Pert, MD  ammonium lactate (AMLACTIN) 12 % lotion Apply 1 application topically as needed for dry skin. 06/28/20  Yes Felipa Furnace, DPM  aspirin 81 MG chewable tablet Place 1 tablet (81 mg total) into feeding tube daily. 09/08/20 10/08/20 Yes Antonieta Pert, MD  carboxymethylcellulose (REFRESH TEARS) 0.5 % SOLN Place 1 drop into both eyes daily as needed (For dry eyes).   Yes [provider]  ferrous sulfate 300 (60 Fe) MG/5ML syrup Place 5 mLs (300 mg total) into feeding tube daily with breakfast. 09/08/20  Yes Kc, Maren Beach, MD  levETIRAcetam (KEPPRA) 100 MG/ML solution Place 15 mLs (1,500 mg total) into feeding tube 2 (two) times daily. 09/08/20 11/07/20 Yes Antonieta Pert, MD  Nutritional Supplements (FEEDING SUPPLEMENT, OSMOLITE 1.5 CAL,) LIQD Place 360 mLs into feeding tube 4 (four) times daily for 28 days. 09/08/20 10/06/20 Yes Antonieta Pert, MD  Nutritional Supplements (FEEDING SUPPLEMENT, PROSOURCE TF,) liquid Place 45 mLs into feeding tube daily. 09/08/20 10/08/20 Yes Antonieta Pert, MD  Water For Irrigation, Sterile (FREE WATER) SOLN Place 200 mLs into feeding tube 4 (four) times daily. 09/08/20  Yes Kc, Maren Beach, MD  brigatinib (ALUNBRIG) 90 & 180 MG TBPK Take 90-180 mg  by mouth daily. Check with your physician at [facility] about when to resume this medication Patient not taking: Reported on 10/15/2020 09/08/20   Antonieta Pert, MD  dexamethasone (DECADRON) 2 MG tablet 1 tablet (2 mg total) by Per J Tube route daily. HOLD IT WHILE HE IS GETTING PREDNISONE 09/08/20   Antonieta Pert, MD  pantoprazole sodium (PROTONIX) 40 mg/20 mL PACK Place 20 mLs (40 mg total) into feeding tube at bedtime. 09/08/20 11/07/20  Antonieta Pert, MD  predniSONE (DELTASONE) 20 MG tablet Take VIA peg TUBE 4 tabs daily  x 3 days,3 tabs daily x 3 days,2 tabs daily x 3 days,1 tab daily x 3 days then stop. 09/08/20   Antonieta Pert, MD     Critical care time: 35 minutes    Roselie Awkward, MD [place] Pager: [phone] Cell: [phone] If no response, call [phone]

## 2020-09-19 NOTE — Progress Notes (Signed)
Pharmacy Antibiotic Note  Cameron Hale is a 44 y.o. male admitted on 10/09/2020 with AMS requiring intubation.  Pharmacy has been consulted for aztreonam dosing as patient may have had a reaction to cefepime.  Plan: Aztreonam 2g IV q8h Flagyl per MD Ok to stop vancomycin per CCM  Height: 5\' 10"  (177.8 cm) Weight: 72.4 kg (159 lb 9.8 oz) IBW/kg (Calculated) : 73  Temp (24hrs), Avg:96.8 F (36 C), Min:93.1 F (33.9 C), Max:98.5 F (36.9 C)  Recent Labs  Lab 10/03/2020 1802 10/01/2020 1952 09/19/20 0332  WBC 5.2  --  4.5  CREATININE <0.30*  --  <0.30*  LATICACIDVEN 0.9 1.2  --     CrCl cannot be calculated (This lab value cannot be used to calculate CrCl because it is not a number: <0.30).    Allergies  Allergen Reactions  . Amoxicillin Itching  . Morphine Itching    Antimicrobials this admission:  11/1 Vanc >> 11/2 11/1 Cefepime >> 11/2 11/1 Flagyl >> 11/2 Aztreonam >>  Dose adjustments this admission:   Microbiology results:  11/1 Flu/COVID: neg 11/1 BCx: aerobic bottles of both sets GNR 11/1 UCx: sent 11/2 MRSA PCR: sent  Thank you for allowing pharmacy to be a part of this patient's care.  Peggyann Juba, PharmD, BCPS Pharmacy: [phone] 09/19/2020 1:49 PM

## 2020-09-19 NOTE — ED Notes (Signed)
[facility] called spoke with [facility] set up transportation at this time.

## 2020-09-19 NOTE — Progress Notes (Signed)
PHARMACY - PHYSICIAN COMMUNICATION CRITICAL VALUE ALERT - BLOOD CULTURE IDENTIFICATION (BCID)  Cameron Hale is an 44 y.o. male who presented to [facility] on 10/13/2020 with a chief complaint of AMS and hypoxia requiring intubation.   Assessment: sepsis secondary to UTI vs. PNA  Name of physician (or Provider) Contacted: Dr. Oletta Darter  Current antibiotics: Aztreonam, Metronidazole   Changes to prescribed antibiotics recommended:  Patient is on recommended antibiotics - No changes needed  Results for orders placed or performed during the hospital encounter of 10/15/2020  Blood Culture ID Panel (Reflexed) (Collected: 09/28/2020  5:41 PM)  Result Value Ref Range   Enterococcus faecalis NOT DETECTED NOT DETECTED   Enterococcus Faecium NOT DETECTED NOT DETECTED   Listeria monocytogenes NOT DETECTED NOT DETECTED   Staphylococcus species NOT DETECTED NOT DETECTED   Staphylococcus aureus (BCID) NOT DETECTED NOT DETECTED   Staphylococcus epidermidis NOT DETECTED NOT DETECTED   Staphylococcus lugdunensis NOT DETECTED NOT DETECTED   Streptococcus species NOT DETECTED NOT DETECTED   Streptococcus agalactiae NOT DETECTED NOT DETECTED   Streptococcus pneumoniae NOT DETECTED NOT DETECTED   Streptococcus pyogenes NOT DETECTED NOT DETECTED   A.calcoaceticus-baumannii NOT DETECTED NOT DETECTED   Bacteroides fragilis NOT DETECTED NOT DETECTED   Enterobacterales DETECTED (A) NOT DETECTED   Enterobacter cloacae complex NOT DETECTED NOT DETECTED   Escherichia coli NOT DETECTED NOT DETECTED   Klebsiella aerogenes NOT DETECTED NOT DETECTED   Klebsiella oxytoca NOT DETECTED NOT DETECTED   Klebsiella pneumoniae DETECTED (A) NOT DETECTED   Proteus species NOT DETECTED NOT DETECTED   Salmonella species NOT DETECTED NOT DETECTED   Serratia marcescens NOT DETECTED NOT DETECTED   Haemophilus influenzae NOT DETECTED NOT DETECTED   Neisseria meningitidis NOT DETECTED NOT DETECTED   Pseudomonas aeruginosa NOT  DETECTED NOT DETECTED   Stenotrophomonas maltophilia NOT DETECTED NOT DETECTED   Candida albicans NOT DETECTED NOT DETECTED   Candida auris NOT DETECTED NOT DETECTED   Candida glabrata NOT DETECTED NOT DETECTED   Candida krusei NOT DETECTED NOT DETECTED   Candida parapsilosis NOT DETECTED NOT DETECTED   Candida tropicalis NOT DETECTED NOT DETECTED   Cryptococcus neoformans/gattii NOT DETECTED NOT DETECTED   CTX-M ESBL NOT DETECTED NOT DETECTED   Carbapenem resistance IMP NOT DETECTED NOT DETECTED   Carbapenem resistance KPC NOT DETECTED NOT DETECTED   Carbapenem resistance NDM NOT DETECTED NOT DETECTED   Carbapenem resist OXA 48 LIKE NOT DETECTED NOT DETECTED   Carbapenem resistance VIM NOT DETECTED NOT DETECTED    Cameron Hale 09/19/2020  7:47 PM

## 2020-09-19 NOTE — ED Notes (Addendum)
Extended Emergency Contact Information Primary Emergency Contact: Bartelson,[name] Address: [address]          [place] [address] [place] Phone: [phone] Work Phone: [phone] Mobile Phone: [phone] Relation: Spouse Secondary Emergency Contact: [name] Mobile Phone: [phone] Relation: Mother  Mother is medical POA. Please call her with any updates.

## 2020-09-19 NOTE — Progress Notes (Signed)
PROGRESS NOTE    Cameron Hale  WEX:[id number] DOB: July 28, 1976 DOA: 09/28/2020 PCP: Alroy Dust, L.Marlou Sa, MD    Brief Narrative:   Cameron Hale is a 44 y.o. male with medical history significant of impaired hearing, neurofibromatosis 2, neurogenic bladder, pressure ulcer, quadriplegia, seizures, vestibular schwannoma, history of cerebral thrombosis with cerebral infarction, admitted and intubated for about 2 weeks on 08/08/2020 until 09/08/2020 at [facility] who is coming to the emergency department who is brought to the emergency department due to progressively worse mental status since Friday, but particularly worse over the last 2 days.  EMS also reported that the patient was unresponsive to sternal rub initially and then became a little responsive.  He was hypotensive with a BP of 75/40.  No emesis, diarrhea or fever per patient's mother.  ED Course: Initial vital signs are temperature 93.1 F, pulse 64, respiration 18, blood pressure 84/52 mmHg and O2 sat 99% on room air. The patient received 3250 mL of LR bolus.  He was started on cefepime, vancomycin and metronidazole.  Urinalysis was turbid with small hemoglobinuria, positive nitrates and large leukocyte esterase.  11-20 RBC and more than 50 WBC per hpf.  There are a few bacteria present there is 6-10 known squamous epithelials cells.  There are WBC clumps.  Fecal occult blood was positive.  CBC showed a white count of 5.2 with 62% neutrophils, 13% lymphocytes and 9% monocytes.  Hemoglobin was 6.2 g/dL and platelets 78.  PT 12.3, INR 1.0 and PTT 33.  Venous blood gas showed a PCO2 of 49.8 mmHg.  Lactic acid has been normal twice.  Sodium 131, potassium 4.3 chloride 101 and CO2 27 mmol/L.  Glucose 108, BUN 37 creatinine less than 0.30 mg/dL.  Total protein is 3.9 and albumin 1.7 g/dL.  AST was 94 units/L.  SARS coronavirus PCR was negative.    Assessment & Plan:   Principal Problem:   Sepsis due to undetermined organism [facility]) Active Problems:    Neurofibroma of multiple sites ([place])   Quadriplegia ([place])   Hyponatremia   Seizures (HCC)   AMS (altered mental status)   GI bleed   Symptomatic anemia   Severe protein-calorie malnutrition (HCC)   Severe sepsis (HCC)   Acute respiratory failure (HCC)   Septic shock (HCC)   Septic shock -Patient presented with hypotension -He received IV fluids bolus per sepsis protocol -Despite that, he had to be started on vasopressors -Source of infection is UTI versus pneumonia -He has been started on IV antibiotics -Blood pressure appears to be stabilizing on vasopressors and Levophed has been titrated  -Since he was recently on a prolonged course of Decadron for neurofibromas, he has been started on stress dose hydrocortisone  UTI -Urinalysis indicates possible infection -Urine culture in process -Continue IV antibiotics  Aspiration pneumonia -Patient has chronic dysphagia and is PEG tube dependent -X-ray shows left-sided whiteout concerning for mucous plug versus aspiration -Continue IV antibiotics  Acute respiratory failure with hypoxia and hypercapnia -Patient intubated in the emergency room for hypoxia and airway protection -Critical care following  Acute metabolic encephalopathy -Secondary to hypercapnia -We will need to reassess once patient is extubated  Acute blood loss anemia -Patient is heme positive -During recent admission, he was evaluated by GI, but did not undergo endoluminal evaluation -Hemoglobin on admission 6.2 -He was transfused 2 units PRBC -Follow-up hemoglobin 9.4  GI bleeding -He is noted to have heme positive stools -We will need to consider GI evaluation once he is stabilized  Neurofibromatosis -Continue follow-up at [facility]  History of seizures -Continue on IV Keppra  Quadriplegia and dysphagia -patient is PEG tube dependent   DVT prophylaxis: SCDs Start: 09/19/20 0036  Code Status: Full code Family Communication: Updated  patient's mother over the phone Disposition Plan: Status is: Inpatient, transfer to [name] long hospital under critical care service.  Patient's mother had initially requested that patient be transferred to [facility].  She wanted him to go to the neuro ICU.  It was explained to her that currently, he does not need to be specifically in a neuro ICU and that a medical ICU would be appropriate.  I explained that keeping the patient in the emergency room at [facility] is not optimal and that he needs to be cared for in an ICU.  Currently, there are no available beds at [facility], but patient does have a bed available at [place]. I reassured her that staff at [facility]  long is more than capable of caring for him.  Dr. Lake Bells has graciously accepted the patient in transfer. His mother has agreed to transfer  Remains inpatient appropriate because:Inpatient level of care appropriate due to severity of illness   Dispo: The patient is from: Home              Anticipated d/c is to: TBD              Anticipated d/c date is: 3 days              Patient currently is not medically stable to d/c.    Consultants:   PCCM  Procedures:   ETT 11/2>  Antimicrobials:   Vancomycin 11/1 >  Cefepime 11/1 >  Metronidazole 11/1>     Subjective: Patient is intubated, unresponsive  Objective: Vitals:   09/19/20 0900 09/19/20 0930 09/19/20 0950 09/19/20 1033  BP: 132/76 126/75    Pulse: 84 84    Resp: 11 16    Temp:   97.9 F (36.6 C)   TempSrc:   Rectal   SpO2: 100% 100%    Weight:      Height:    5\' 10"  (1.778 m)    Intake/Output Summary (Last 24 hours) at 09/19/2020 1035 Last data filed at 09/19/2020 1001 Gross per 24 hour  Intake 730 ml  Output 1900 ml  Net -1170 ml   Filed Weights   10/06/2020 1822  Weight: 72.4 kg    Examination:  General exam: intubated Respiratory system: Clear to auscultation. Respiratory effort normal. Cardiovascular system: S1 & S2 heard, RRR. No  JVD, murmurs, rubs, gallops or clicks. No pedal edema. Gastrointestinal system: Abdomen is nondistended, soft and nontender. No organomegaly or masses felt. Normal bowel sounds heard. PEG tube Central nervous system: Alert and oriented. No focal neurological deficits. Extremities: Symmetric 5 x 5 power. Skin: No rashes, lesions or ulcers Psychiatry:intubated    Data Reviewed: I have personally reviewed following labs and imaging studies  CBC: Recent Labs  Lab 10/02/2020 1802 09/19/20 0332  WBC 5.2 4.5  NEUTROABS 3.2 2.5  HGB 6.2* 9.4*  HCT 19.6* 30.6*  MCV 88.3 90.8  PLT 78* 88*   Basic Metabolic Panel: Recent Labs  Lab 10/10/2020 1802 09/19/20 0332  NA 131* 140  K 4.3 5.0  CL 101 104  CO2 27 22  GLUCOSE 108* 55*  BUN 37* 30*  CREATININE <0.30* <0.30*  CALCIUM 7.2* 7.7*  MG 2.0  --   PHOS 2.4*  --  GFR: CrCl cannot be calculated (This lab value cannot be used to calculate CrCl because it is not a number: <0.30). Liver Function Tests: Recent Labs  Lab 10/15/2020 1802 09/19/20 0332  AST 40 42*  ALT 94* 95*  ALKPHOS 85 78  BILITOT 0.2* 0.6  PROT 3.9* 4.2*  ALBUMIN 1.7* 1.8*   No results for input(s): LIPASE, AMYLASE in the last 168 hours. No results for input(s): AMMONIA in the last 168 hours. Coagulation Profile: Recent Labs  Lab 09/24/2020 1802  INR 1.0   Cardiac Enzymes: No results for input(s): CKTOTAL, CKMB, CKMBINDEX, TROPONINI in the last 168 hours. BNP (last 3 results) No results for input(s): PROBNP in the last 8760 hours. HbA1C: No results for input(s): HGBA1C in the last 72 hours. CBG: Recent Labs  Lab 10/13/2020 1801  GLUCAP 97   Lipid Profile: Recent Labs    09/19/20 0332  TRIG 142   Thyroid Function Tests: No results for input(s): TSH, T4TOTAL, FREET4, T3FREE, THYROIDAB in the last 72 hours. Anemia Panel: No results for input(s): VITAMINB12, FOLATE, FERRITIN, TIBC, IRON, RETICCTPCT in the last 72 hours. Sepsis Labs: Recent Labs    Lab 09/22/2020 1802 09/29/2020 1952  LATICACIDVEN 0.9 1.2    Recent Results (from the past 240 hour(s))  Blood Culture (routine x 2)     Status: None (Preliminary result)   Collection Time: 09/21/2020  5:41 PM   Specimen: Left Antecubital; Blood  Result Value Ref Range Status   Specimen Description LEFT ANTECUBITAL  Final   Special Requests   Final    BOTTLES DRAWN AEROBIC AND ANAEROBIC Blood Culture adequate volume Performed at [facility], [address], [place], [place] [address]    Culture PENDING  Incomplete   Report Status PENDING  Incomplete  Blood Culture (routine x 2)     Status: None (Preliminary result)   Collection Time: 10/08/2020  5:41 PM   Specimen: BLOOD RIGHT ARM  Result Value Ref Range Status   Specimen Description BLOOD RIGHT ARM  Final   Special Requests   Final    BOTTLES DRAWN AEROBIC AND ANAEROBIC Blood Culture adequate volume Performed at [facility], [address], [place], [place] [address]    Culture PENDING  Incomplete   Report Status PENDING  Incomplete  Respiratory Panel by RT PCR (Flu A&B, Covid) - Nasopharyngeal Swab     Status: None   Collection Time: 09/28/2020  5:48 PM   Specimen: Nasopharyngeal Swab  Result Value Ref Range Status   SARS Coronavirus 2 by RT PCR NEGATIVE NEGATIVE Final    Comment: (NOTE) SARS-CoV-2 target nucleic acids are NOT DETECTED.  The SARS-CoV-2 RNA is generally detectable in upper respiratoy specimens during the acute phase of infection. The lowest concentration of SARS-CoV-2 viral copies this assay can detect is 131 copies/mL. A negative result does not preclude SARS-Cov-2 infection and should not be used as the sole basis for treatment or other patient management decisions. A negative result may occur with  improper specimen collection/handling, submission of specimen other than nasopharyngeal swab, presence of viral mutation(s) within the areas targeted by this assay, and inadequate number of viral copies (<131  copies/mL). A negative result must be combined with clinical observations, patient history, and epidemiological information. The expected result is Negative.  Fact Sheet for Patients:  PinkCheek.be  Fact Sheet for Healthcare Providers:  GravelBags.it  This test is no t yet approved or cleared by the [place] FDA and  has been authorized for detection and/or  diagnosis of SARS-CoV-2 by FDA under an Emergency Use Authorization (EUA). This EUA will remain  in effect (meaning this test can be used) for the duration of the COVID-19 declaration under Section 564(b)(1) of the Act, 21 U.S.C. section 360bbb-3(b)(1), unless the authorization is terminated or revoked sooner.     Influenza A by PCR NEGATIVE NEGATIVE Final   Influenza B by PCR NEGATIVE NEGATIVE Final    Comment: (NOTE) The Xpert Xpress SARS-CoV-2/FLU/RSV assay is intended as an aid in  the diagnosis of influenza from Nasopharyngeal swab specimens and  should not be used as a sole basis for treatment. Nasal washings and  aspirates are unacceptable for Xpert Xpress SARS-CoV-2/FLU/RSV  testing.  Fact Sheet for Patients: PinkCheek.be  Fact Sheet for Healthcare Providers: GravelBags.it  This test is not yet approved or cleared by the [place] FDA and  has been authorized for detection and/or diagnosis of SARS-CoV-2 by  FDA under an Emergency Use Authorization (EUA). This EUA will remain  in effect (meaning this test can be used) for the duration of the  Covid-19 declaration under Section 564(b)(1) of the Act, 21  U.S.C. section 360bbb-3(b)(1), unless the authorization is  terminated or revoked. Performed at [facility], [address], [place], [place] [address]          Radiology Studies: CT Head Wo Contrast  Result Date: 09/20/2020 CLINICAL DATA:  Hypotension.  Mental status changes. EXAM: CT  HEAD WITHOUT CONTRAST TECHNIQUE: Contiguous axial images were obtained from the base of the skull through the vertex without intravenous contrast. COMPARISON:  08/14/2020 FINDINGS: Brain: Again noted are multiple meningiomas compatible with meningiomatosis, unchanged since prior study. Bifrontal edema surrounding the large para fall seen meningioma, unchanged. No acute infarction or hemorrhage visualized. No hydrocephalus. Vascular: No hyperdense vessel or unexpected calcification. Skull: Prior craniotomy defects.  No acute calvarial abnormality. Sinuses/Orbits: No acute finding Other: None IMPRESSION: Extensive meningiomatosis again noted, unchanged. Bifrontal edema is also a stable. No acute intracranial abnormality. Electronically Signed   By: Rolm Baptise M.D.   On: 10/06/2020 22:57   DG Chest Portable 1 View  Result Date: 09/19/2020 CLINICAL DATA:  Intubation, respiratory distress EXAM: PORTABLE CHEST 1 VIEW COMPARISON:  6:13 p.m. FINDINGS: Endotracheal tube is seen 5.3 cm above the carina. Nasogastric tube extends into the upper abdomen beyond the margin of the examination. Gastrostomy catheter is seen within the visualized right upper quadrant. There is interval complete collapse of the left lower lobe and subtotal collapse of the left upper lobe with resultant mediastinal shift to the left and hyperinflation of the right lung. No pneumothorax or pleural effusion. Pulmonary vascularity is normal. Extensive thoracolumbar fusion instrumentation is again noted. IMPRESSION: Interval subtotal left lung collapse with compensatory hyperinflation of the right lung and mediastinal shift. Correlation for a central obstructing lesion, such as a mucous plug, may be helpful. Electronically Signed   By: Fidela Salisbury MD   On: 09/19/2020 03:08   DG Chest Port 1 View  Result Date: 10/15/2020 CLINICAL DATA:  Possible sepsis. EXAM: PORTABLE CHEST 1 VIEW COMPARISON:  Chest x-ray dated September 04, 2020. FINDINGS:  Interval removal of the right PICC line. The heart size and mediastinal contours are within normal limits. Normal pulmonary vascularity. Mild medial bibasilar atelectasis. No focal consolidation, pleural effusion, or pneumothorax. No acute osseous abnormality. IMPRESSION: No active disease. Electronically Signed   By: Titus Dubin M.D.   On: 10/06/2020 18:24        Scheduled Meds: . docusate  100 mg Oral BID  . hydrocortisone sod succinate (SOLU-CORTEF) inj  100 mg Intravenous Q8H  . pantoprazole (PROTONIX) IV  40 mg Intravenous Q12H  . polyethylene glycol  17 g Oral Daily   Continuous Infusions: . ceFEPime (MAXIPIME) IV Stopped (09/19/20 0420)  . fentaNYL infusion INTRAVENOUS 50 mcg/hr (09/19/20 0939)  . lactated ringers 150 mL/hr at 09/19/20 0851  . levETIRAcetam 1,500 mg (09/19/20 1001)  . metronidazole Stopped (09/19/20 1001)  . norepinephrine (LEVOPHED) Adult infusion 4 mcg/min (09/19/20 0920)  . vancomycin Stopped (09/19/20 0525)     LOS: 1 day    CRITICAL CARE Performed by: Kathie Dike   Total critical care time: 35 minutes  Critical care time was exclusive of separately billable procedures and treating other patients.  Critical care was necessary to treat or prevent imminent or life-threatening deterioration.  Critical care was time spent personally by me on the following activities: development of treatment plan with patient and/or surrogate as well as nursing, discussions with consultants, evaluation of patient's response to treatment, examination of patient, obtaining history from patient or surrogate, ordering and performing treatments and interventions, ordering and review of laboratory studies, ordering and review of radiographic studies, pulse oximetry and re-evaluation of patient's condition.     Kathie Dike, MD [facility]   If 7PM-7AM, please contact night-coverage [email or web address]  09/19/2020, 10:35 AM

## 2020-09-19 NOTE — ED Notes (Addendum)
Pt being prepared to intubate.  0234 timeout done by Dr. Dina Rich (Dr. Olevia Bowens; Reuben Likes, RT; and Catie, RN, also present at bedside) 0235 15mg  Etomidate given by Catie, RN 0236 150mg  Succ given by Catie, RN 2:37 AM +color change, bilat breath sounds 23 at teeth, 7.5 ETT

## 2020-09-20 ENCOUNTER — Inpatient Hospital Stay (HOSPITAL_COMMUNITY)
Admit: 2020-09-20 | Discharge: 2020-09-20 | Disposition: A | Discharge status: Home / Self Care | Payer: Medicare Other | Attending: Acute Care | Admitting: Acute Care

## 2020-09-20 ENCOUNTER — Inpatient Hospital Stay (HOSPITAL_COMMUNITY): Payer: Medicare Other

## 2020-09-20 DIAGNOSIS — A419 Sepsis, unspecified organism: Secondary | ICD-10-CM | POA: Diagnosis not present

## 2020-09-20 DIAGNOSIS — R4020 Unspecified coma: Secondary | ICD-10-CM | POA: Diagnosis not present

## 2020-09-20 DIAGNOSIS — R569 Unspecified convulsions: Secondary | ICD-10-CM | POA: Diagnosis not present

## 2020-09-20 DIAGNOSIS — J9602 Acute respiratory failure with hypercapnia: Secondary | ICD-10-CM | POA: Diagnosis not present

## 2020-09-20 DIAGNOSIS — R9401 Abnormal electroencephalogram [EEG]: Secondary | ICD-10-CM

## 2020-09-20 DIAGNOSIS — L8990 Pressure ulcer of unspecified site, unspecified stage: Secondary | ICD-10-CM | POA: Insufficient documentation

## 2020-09-20 DIAGNOSIS — R4182 Altered mental status, unspecified: Secondary | ICD-10-CM

## 2020-09-20 LAB — GLUCOSE, CAPILLARY
Glucose-Capillary: 117 mg/dL — ABNORMAL HIGH (ref 70–99)
Glucose-Capillary: 118 mg/dL — ABNORMAL HIGH (ref 70–99)
Glucose-Capillary: 139 mg/dL — ABNORMAL HIGH (ref 70–99)
Glucose-Capillary: 120 mg/dL — ABNORMAL HIGH (ref 70–99)
Glucose-Capillary: 163 mg/dL — ABNORMAL HIGH (ref 70–99)

## 2020-09-20 LAB — CBC WITH DIFFERENTIAL/PLATELET
Abs Immature Granulocytes: 0.66 10*3/uL — ABNORMAL HIGH (ref 0.00–0.07)
Basophils Absolute: 0 10*3/uL (ref 0.0–0.1)
Basophils Relative: 0 %
Eosinophils Absolute: 0 10*3/uL (ref 0.0–0.5)
Eosinophils Relative: 0 %
HCT: 29.2 % — ABNORMAL LOW (ref 39.0–52.0)
Hemoglobin: 9.4 g/dL — ABNORMAL LOW (ref 13.0–17.0)
Immature Granulocytes: 7 %
Lymphocytes Relative: 3 %
Lymphs Abs: 0.3 10*3/uL — ABNORMAL LOW (ref 0.7–4.0)
MCH: 27.7 pg (ref 26.0–34.0)
MCHC: 32.2 g/dL (ref 30.0–36.0)
MCV: 86.1 fL (ref 80.0–100.0)
Monocytes Absolute: 0.6 10*3/uL (ref 0.1–1.0)
Monocytes Relative: 6 %
Neutro Abs: 8.1 10*3/uL — ABNORMAL HIGH (ref 1.7–7.7)
Neutrophils Relative %: 84 %
Platelets: 77 10*3/uL — ABNORMAL LOW (ref 150–400)
RBC: 3.39 MIL/uL — ABNORMAL LOW (ref 4.22–5.81)
RDW: 18.1 % — ABNORMAL HIGH (ref 11.5–15.5)
WBC: 9.7 10*3/uL (ref 4.0–10.5)
nRBC: 0.6 % — ABNORMAL HIGH (ref 0.0–0.2)

## 2020-09-20 LAB — BPAM RBC
Blood Product Expiration Date: 202112042359
Blood Product Expiration Date: 202112042359
ISSUE DATE / TIME: 202111012335
ISSUE DATE / TIME: 202111020355
Unit Type and Rh: 5100
Unit Type and Rh: 5100

## 2020-09-20 LAB — COMPREHENSIVE METABOLIC PANEL
ALT: 76 U/L — ABNORMAL HIGH (ref 0–44)
AST: 43 U/L — ABNORMAL HIGH (ref 15–41)
Albumin: 1.7 g/dL — ABNORMAL LOW (ref 3.5–5.0)
Alkaline Phosphatase: 76 U/L (ref 38–126)
Anion gap: 7 (ref 5–15)
BUN: 29 mg/dL — ABNORMAL HIGH (ref 6–20)
CO2: 20 mmol/L — ABNORMAL LOW (ref 22–32)
Calcium: 7.6 mg/dL — ABNORMAL LOW (ref 8.9–10.3)
Chloride: 113 mmol/L — ABNORMAL HIGH (ref 98–111)
Creatinine, Ser: 0.32 mg/dL — ABNORMAL LOW (ref 0.61–1.24)
GFR, Estimated: >60 mL/min (ref >60)
Glucose, Bld: 120 mg/dL — ABNORMAL HIGH (ref 70–99)
Potassium: 3.8 mmol/L (ref 3.5–5.1)
Sodium: 140 mmol/L (ref 135–145)
Total Bilirubin: 0.4 mg/dL (ref 0.3–1.2)
Total Protein: 4.1 g/dL — ABNORMAL LOW (ref 6.5–8.1)

## 2020-09-20 LAB — TYPE AND SCREEN
ABO/RH(D): O POS
Antibody Screen: NEGATIVE
Unit division: 0
Unit division: 0

## 2020-09-20 LAB — PHOSPHORUS
Phosphorus: 3.4 mg/dL (ref 2.5–4.6)
Phosphorus: 3.2 mg/dL (ref 2.5–4.6)

## 2020-09-20 LAB — BLOOD GAS, ARTERIAL
Acid-base deficit: 1.5 mmol/L (ref 0.0–2.0)
Acid-base deficit: 2.2 mmol/L — ABNORMAL HIGH (ref 0.0–2.0)
Bicarbonate: 20.3 mmol/L (ref 20.0–28.0)
Bicarbonate: 21.1 mmol/L (ref 20.0–28.0)
Drawn by: 270211
Drawn by: 270211
FIO2: 40
FIO2: 40
MECHVT: 580 mL
MECHVT: 580 mL
O2 Saturation: 98.9 %
O2 Saturation: 99.5 %
PEEP: 5 cmH2O
PEEP: 5 cmH2O
Patient temperature: 98.1
Patient temperature: 98.1
RATE: 16 resp/min
RATE: 12 resp/min
pCO2 arterial: 26.2 mmHg — ABNORMAL LOW (ref 32.0–48.0)
pCO2 arterial: 32.3 mmHg (ref 32.0–48.0)
pH, Arterial: 7.5 — ABNORMAL HIGH (ref 7.350–7.450)
pH, Arterial: 7.43 (ref 7.350–7.450)
pO2, Arterial: 132 mmHg — ABNORMAL HIGH (ref 83.0–108.0)
pO2, Arterial: 148 mmHg — ABNORMAL HIGH (ref 83.0–108.0)

## 2020-09-20 LAB — MAGNESIUM
Magnesium: 2 mg/dL (ref 1.7–2.4)
Magnesium: 1.9 mg/dL (ref 1.7–2.4)

## 2020-09-20 MED ORDER — CHLORHEXIDINE GLUCONATE 0.12 % MT SOLN
OROMUCOSAL | Status: AC
  Filled 2020-09-20: qty 15

## 2020-09-20 MED ORDER — DOCUSATE SODIUM 50 MG/5ML PO LIQD: 100.0000 mg | Freq: Two times a day (BID) | ORAL | Status: DC

## 2020-09-20 MED ORDER — OSMOLITE 1.2 CAL PO LIQD
1000.0000 mL | ORAL | Status: DC
  Administered 2020-09-20 – 2020-09-21 (×2): 1000 mL

## 2020-09-20 MED ORDER — LEVETIRACETAM 100 MG/ML PO SOLN
1500.0000 mg | Freq: Two times a day (BID) | ORAL | Status: DC
  Administered 2020-09-20 – 2020-10-10 (×41): 1500 mg
  Filled 2020-09-20 (×42): qty 15

## 2020-09-20 MED ORDER — FERROUS SULFATE 300 (60 FE) MG/5ML PO SYRP
300.0000 mg | ORAL_SOLUTION | Freq: Every day | ORAL | Status: DC
  Administered 2020-09-20 – 2020-10-10 (×21): 300 mg
  Filled 2020-09-20 (×23): qty 5

## 2020-09-20 MED ORDER — GERHARDT'S BUTT CREAM
TOPICAL_CREAM | Freq: Three times a day (TID) | CUTANEOUS | Status: DC
  Administered 2020-09-20 – 2020-10-10 (×15): 1 via TOPICAL
  Filled 2020-09-20 (×4): qty 1

## 2020-09-20 MED ORDER — POLYETHYLENE GLYCOL 3350 17 G PO PACK: 17.0000 g | PACK | Freq: Every day | ORAL | Status: DC

## 2020-09-20 MED ORDER — LABETALOL HCL 5 MG/ML IV SOLN: 20.0000 mg | INTRAVENOUS | Status: DC | PRN

## 2020-09-20 MED ORDER — PROSOURCE TF PO LIQD
45.0000 mL | Freq: Three times a day (TID) | ORAL | Status: DC
  Administered 2020-09-20 – 2020-09-22 (×6): 45 mL
  Filled 2020-09-20 (×6): qty 45

## 2020-09-20 NOTE — Progress Notes (Signed)
Patient allowed to have 3 visitors/day, one at a time. Visitors are [employer] (mom), Tommy Lee(dad) and [employer] (spouse). Visitation changes approved by management and family informed of visitation changes

## 2020-09-20 NOTE — Progress Notes (Signed)
NAME:  Cameron Hale, [name]:  [id number], DOB:  1976-10-03, LOS: 2 ADMISSION DATE:  09/26/2020, CONSULTATION DATE:  09/19/2020 REFERRING MD:  Dr. Dyann Kief, CHIEF COMPLAINT:  Sepsis   Brief History   44 yo Male hx of quadriplegia and recent intubation, admitted to [facility] on 11/1 for AMS and ARF with hypoxia requiring intubation.    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 G-tube (09/05/2020)   Significant Hospital Events   Admitted to [facility] 11/1>>11/2 Transferred to [facility] 11/2>>, on pressors. 11/3: Pressors off.  Growing K pneumoniae in blood and urine, antibiotics narrowed.  Consults:  PCCM  Procedures:  Intubated 11/2 >>  Significant Diagnostic Tests:  11/1 CT >> extensive meningiomatosis noted unchanged. Bifrontal edema is stable. No acute intracranial abnormality.   Micro Data:  11/1 Flu >> negative 11/1 Covid >> negative 11/1 Blood culture >> K pneumoniae 11/1 Urine culture >> K pneumoniae  Antimicrobials:  11/1 Cefepime >> 11/2 Azactam 11/2 11/2 Metronidazole >> 11/3 11/2 Vancomycin >> 11/3  Interim history/subjective:  Sedated, did not trigger spontaneous efforts on pressure support  Objective   Blood pressure (Abnormal) 117/55, pulse 81, temperature 98.1 F (36.7 C), temperature source Axillary, resp. rate 17, height '5\' 10"'  (1.778 m), weight 72.4 kg, SpO2 98 %.    Vent Mode: PRVC FiO2 (%):  [40 %-80 %] 40 % Set Rate:  [16 bmp] 16 bmp Vt Set:  [580 mL] 580 mL PEEP:  [5 cmH20] 5 cmH20 Plateau Pressure:  [15 cmH20-18 cmH20] 15 cmH20   Intake/Output Summary (Last 24 hours) at 09/20/2020 1105 Last data filed at 09/20/2020 0900 Gross per 24 hour  Intake 4854.91 ml  Output 750 ml  Net 4104.91 ml   Filed Weights   10/02/2020 1822 09/20/20 0444  Weight: 72.4 kg 72.4 kg    Examination: General 44 year old male remains  lethargic/minimally responsive HEENT normocephalic atraumatic old craniotomy incision intact Pulmonary: Scattered rhonchi no accessory use Cardiac: Regular rate and rhythm Abdomen: Soft nontender Extremities: Warm dry, dependent edema has bilateral foot drop GU: Clear yellow Neuro: Eyes are open but not responsive, has constant chewing activity   Resolved Hospital Problem list    Hypothermia Hyponatremia Hypoglycemia   Assessment & Plan:  Sepsis/septic shock; Urinary tract source w/ associated K Pneumoniae bacteremia  -shock resolving, off norepi this am Plan Dc vanc and flagyl  Day 3 abx. Will continue monotherapy w/ Azactam. Await sensitivities  Cont stress dose steroids  Cont midodrine  Acute respiratory failure  Left lung collapse/possible mucus plug due to neuromuscular weakness, made worse in setting of sepsis PCXR: Portable chest x-ray personally reviewed this demonstrates the endotracheal tube in satisfactory position.  Continues to have left greater than right airspace disease however aeration improved when comparing film from prior day He failed spontaneous breathing trial effort Did not initiate any spontaneous breaths Mental status we will not facilitate extubation at this point Plan Continue full ventilator support Dec rr to 12 (has iatrogenic resp alk on RR of 16) VAP bundle PAD protocol A.m. chest x-ray May need to consider diuresis once he is more hemodynamically stable Continue chest physiotherapy     acute metabolic encephalopathy superimposed on Neurofibroma of multiple sites ([place]) Quadriplegia ([place]) -working dx is that this is UTI/sepsis driving acute Mental status change.  Plan Cont supportive care Dc fent gtt; change to PRN only  RASS goal 0  Will aim  for extubation but if fails we will need to consider trach  Last consult was 9/29 at that point NP [place] met w/ mother and wife. Prob will be helpful to involve them again  R/o seizure    Seizures ([place]) Has a tremor but not convinced that this is actually seizure.  Plan Cont Keppra (OK to change to VT) Will get EEG as MS still not at baseline   Symptomatic anemia w/ Positive fecal occult blood Thombrocytopenia -positive fecal occult blood X 2 Got 2 units of blood given 11/1 for Hb 6.2 has now been stable for > 24 hrs as of 11/3 PLTs stable  Plan Cont PPI BID  Cont FESO4 Previously seen by GI on 10/18. They noted they would like to avoid endoscopy if able. Will repeat cbc in am again if still stable will just cont PPI, if drops might me best to get them back again. If we are going to consider EGD doing it while intubated makes most sense   Severe protein-calorie malnutrition ([place]) Plan Tubefeeds via PEG   Liquids stools Plan Stop stool softeners and Laxatives   Best practice:  Diet: NPO Pain/Anxiety/Delirium protocol (if indicated): Versed, fentanyl VAP protocol (if indicated): yes DVT prophylaxis: SCDs (Hb low and positive iFOB) GI prophylaxis: protonix Glucose control:  Mobility: bed rest Code Status: Full Family Communication: updated wife baseline Disposition:  Continue ICU care   Critical care time: 40 minutes     Erick Colace ACNP-BC [place] Pager # [phone] OR # [phone] if no answer

## 2020-09-20 NOTE — Progress Notes (Signed)
RN noted BP at 178/99. Retake 180/90. MD notified- RN to hold 1700 midodrine.   RN increased HOB, removed SCDs/irritating stimuli and dimmed lights in room. BP decreased to 156/81

## 2020-09-20 NOTE — Progress Notes (Signed)
EEG complete - results pending 

## 2020-09-20 NOTE — Procedures (Signed)
Patient Name: ESWIN WORRELL  [name]: [id number]  Epilepsy Attending: Lora Havens  Referring Physician/Provider: Salvadore Dom, NP Date: 09/20/2020 Duration: 25.50 minutes  Patient history: 44 year old male with history of neurofibroma, quadriplegia who is here for altered mental status and was noted to have seizure-like activity.  EEG to evaluate for seizures.  Level of alertness: Awake/ lethargic  AEDs during EEG study: Keppra  Technical aspects: This EEG study was done with scalp electrodes positioned according to the 10-20 International system of electrode placement. Electrical activity was acquired at a sampling rate of 500Hz  and reviewed with a high frequency filter of 70Hz  and a low frequency filter of 1Hz . EEG data were recorded continuously and digitally stored.   Description: No clear posterior dominant rhythm was seen.  EEG showed continuous generalized polymorphic 3 to 6 Hz theta-delta slowing.  Hyperventilation and photic stimulation were not performed.     ABNORMALITY -Continuous slow, generalized  IMPRESSION: This study is suggestive of moderate to severe diffuse encephalopathy, nonspecific etiology. No seizures or epileptiform discharges were seen throughout the recording.  Kele Withem [name]

## 2020-09-20 NOTE — Progress Notes (Signed)
170 mL fentanyl wasted by this RN and [place] in [employer]

## 2020-09-20 NOTE — Progress Notes (Signed)
Initial Nutrition Assessment  DOCUMENTATION CODES:   Not applicable  INTERVENTION:  - will adjust TF regimen: Osmolite 1.2 @ 55 ml/hr with 45 ml Prosource TF TID. - this regimen will provide 1704 kcal, 106 grams protein, and 1082 ml free water. - free water flush, if desired, to be per CCM given notes mentioning possible need for diuresis.     NUTRITION DIAGNOSIS:   Inadequate oral intake related to inability to eat as evidenced by NPO status.  GOAL:   Patient will meet greater than or equal to 90% of their needs  MONITOR:   Vent status, TF tolerance, Labs, Weight trends, Skin  REASON FOR ASSESSMENT:   Ventilator, Consult Enteral/tube feeding initiation and management  ASSESSMENT:   44 y.o. male with medical history of impaired hearing, neurofibromatosis 2, neurogenic bladder, pressure ulcer, quadriplegia, seizures, vestibular schwannoma, and cerebral thrombosis with cerebral infarction. He was intubated 9/21-10/22 at [facility]. He presented to the ED due to worsening mental status since 10/29. EMS reported to ED staff that patient was unresponsive to sternal rub initially.  No family/visitors present at this time. He was intubated in the ED before transferring to the ICU. Patient has PEG at baseline. He is currently receiving TF per protocol: Vital High Protein @ 40 ml/hr with 45 ml Prosource TF BID. This regimen provides 1040 kcal, 106 grams protein, and 802 ml free water.   Notes indicate that patient is able to communicate with head nods/shakes at baseline.   Per chart review, home TF regimen is 360 ml Osmolite 1.5 QID (1440 ml/day) with 45 ml Prosource TF once/day and 200 ml free water QID. This regimen provides 2197 kcal, 101 grams protein, and 1900 ml free water.   Weight today is 160 lb and PTA the most recently documented weight was on 05/30/20 when he weighed 144 lb.   Per notes: - sepsis/septic shock--resolving - L lung collapse and possible mucus plug - failed  SBT today - acute metabolic encephalopathy - hx of quadriplegia - plan for EEG d/t tremors with hx of seizures - severe protein-calorie malnutrition   Patient is currently intubated on ventilator support MV: 6.7 L/min Temp (24hrs), Avg:97.4 F (36.3 C), Min:96.1 F (35.6 C), Max:98.2 F (36.8 C) Propofol: none BP: 139/79 and MAP: 98  Labs reviewed; CBGs: 117 and 118 mg/dl, Cl: 113 mmol/l, BUN: 29 mg/dl, creatinine: 0.32 mg/dl, Ca: 7.6 mg/dl. Medications reviewed; 300 mg ferrous sulfate/day, 100 mg solu-cortef TID, 40 mg IV protonix BID.      NUTRITION - FOCUSED PHYSICAL EXAM:    Most Recent Value  Orbital Region No depletion  Upper Arm Region No depletion  Thoracic and Lumbar Region Unable to assess  Buccal Region No depletion  Temple Region Moderate depletion  Clavicle Bone Region Mild depletion  Clavicle and Acromion Bone Region Mild depletion  Scapular Bone Region Unable to assess  Dorsal Hand No depletion  Patellar Region No depletion  Anterior Thigh Region Moderate depletion  Posterior Calf Region Mild depletion  Edema (RD Assessment) Mild  [all extremities]  Hair Reviewed  Eyes Reviewed  Lennox Pippins red]  Mouth Unable to assess  Skin Reviewed  Nails Reviewed       Diet Order:   Diet Order            Diet NPO time specified  Diet effective now                 EDUCATION NEEDS:   No education needs have been identified  at this time  Skin:  Skin Assessment: Skin Integrity Issues: Skin Integrity Issues:: Stage I, Stage III Stage I: R elbow; R heel Stage III: sacrum  Last BM:  11/2 (type 7)  Height:   Ht Readings from Last 1 Encounters:  09/19/20 5\' 10"  (1.778 m)    Weight:   Wt Readings from Last 1 Encounters:  09/20/20 72.4 kg    Estimated Nutritional Needs:  Kcal:  1695 kcal Protein:  105-120 grams Fluid:  >/= 2 L/day     Cameron Matin, MS, RD, LDN, CNSC Inpatient Clinical Dietitian RD pager # available in AMION  After  hours/weekend pager # available in [facility]

## 2020-09-20 NOTE — TOC Initial Note (Signed)
Transition of Care [facility]) - Initial/Assessment Note    Patient Details  Name: Cameron Hale [name]: [id number] Date of Birth: 03-11-1976  Transition of Care [facility]) CM/SW Contact:    Leeroy Cha, RN Phone Number: 09/20/2020, 8:06 AM  Clinical Narrative:                 44 y.o. male with medical history significant of impaired hearing, neurofibromatosis 2, neurogenic bladder, pressure ulcer, quadriplegia, seizures, vestibular schwannoma, history of cerebral thrombosis with cerebral infarction, admitted and intubated for about 2 weeks on 08/08/2020 until 09/08/2020 at [facility] who is coming to the emergency department who is brought to the emergency department due to progressively worse mental status since Friday, but particularly worse over the last 2 days.  EMS also reported that the patient was unresponsive to sternal rub initially and then became a little responsive.  He was hypotensive with a BP of 75/40.  No emesis, diarrhea or fever per patient's mother.  ED Course: Initial vital signs are temperature 93.1 F, pulse 64, respiration 18, blood pressure 84/52 mmHg and O2 sat 99% on room air. The patient received 3250 mL of LR bolus.  He was started on cefepime, vancomycin and metronidazole.  Urinalysis was turbid with small hemoglobinuria, positive nitrates and large leukocyte esterase.  11-20 RBC and more than 50 WBC per hpf.  There are a few bacteria present there is 6-10 known squamous epithelials cells.  There are WBC clumps.  Fecal occult blood was positive.  CBC showed a white count of 5.2 with 62% neutrophils, 13% lymphocytes and 9% monocytes.  Hemoglobin was 6.2 g/dL and platelets 78.  PT 12.3, INR 1.0 and PTT 33.  Venous blood gas showed a PCO2 of 49.8 mmHg.  Lactic acid has been normal twice.  Sodium 131, potassium 4.3 chloride 101 and CO2 27 mmol/L.  Glucose 108, BUN 37 creatinine less than 0.30 mg/dL.  Total protein is 3.9 and albumin 1.7 g/dL.  AST was 94 units/L.  SARS coronavirus PCR  was negative.  Imaging: Initial chest radiograph-mild medial basilar atelectasis.  Follow chest radiograph opacification of the left lung aspiration suspected.  CT head did not show any acute intracranial normality, but still demonstrates extensive meningiomatosis, but bifrontal edema stable. Lives at home with parentsin Linna Hoff was [facility] h[facility] for RN<PT,AIDE,and ST Will follow for continueation of hhc services and progression. Expected Discharge Plan: Home/Self Care Barriers to Discharge: Barriers Unresolved (comment) (medical treatment)   Patient Goals and CMS Choice Patient states their goals for this hospitalization and ongoing recovery are:: to return patient home CMS Medicare.gov Compare Post Acute Care list provided to:: Legal Guardian    Expected Discharge Plan and Services Expected Discharge Plan: Home/Self Care   Discharge Planning Services: CM Consult   Living arrangements for the past 2 months: Single Family Home                                      Prior Living Arrangements/Services Living arrangements for the past 2 months: Single Family Home Lives with:: Parents Patient language and need for interpreter reviewed:: Yes Do you feel safe going back to the place where you live?: Yes      Need for Family Participation in Patient Care: Yes (Comment) Care giver support system in place?: Yes (comment)   Criminal Activity/Legal Involvement Pertinent to Current Situation/Hospitalization: No - Comment as  needed  Activities of Daily Living   ADL Screening (condition at time of admission) Is the patient deaf or have difficulty hearing?: Yes Does the patient have difficulty seeing, even when wearing glasses/contacts?: Yes Does the patient have difficulty dressing or bathing?: Yes Independently performs ADLs?: No Does the patient have difficulty walking or climbing stairs?: Yes (paralysis) Weakness of Legs: Both Weakness of  Arms/Hands: Both  Permission Sought/Granted                  Emotional Assessment Appearance:: Appears stated age Attitude/Demeanor/Rapport: Engaged          Admission diagnosis:  Acute respiratory failure ([place]) [J96.00] Rectal bleeding [K62.5] GI bleed [K92.2] Septic shock ([place]) [A41.9, R65.21] Severe sepsis ([place]) [A41.9, R65.20] Urinary tract infection without hematuria, site unspecified [N39.0] Anemia, unspecified type [D64.9] Sepsis with acute organ dysfunction without septic shock, due to unspecified organism, unspecified type ([place]) [A41.9, R65.20] Patient Active Problem List   Diagnosis Date Noted  . Severe sepsis ([place]) 09/19/2020  . Acute respiratory failure ([place]) 09/19/2020  . Septic shock ([place]) 09/19/2020  . GI bleed 09/28/2020  . Sepsis due to undetermined organism ([place]) 10/07/2020  . Symptomatic anemia 09/21/2020  . Severe protein-calorie malnutrition ([place]) 09/23/2020  . DNR (do not resuscitate) discussion   . History of ETT   . Palliative care by specialist   . Cerebral thrombosis with cerebral infarction 08/14/2020  . Acute respiratory failure with hypoxia ([place]) 08/09/2020  . Transaminasemia   . AMS (altered mental status) 08/08/2020  . Aspiration pneumonia ([employer]) 08/08/2020  . Neurofibromatosis 2 ([place]) 08/08/2020  . Acute blood loss anemia   . Hypokalemia   . Hyponatremia   . Seizures ([place])   . Neurogenic bowel   . Incomplete paraplegia ([place]) 05/09/2020  . Quadriplegia ([place]) 05/09/2020  . Sensorineural hearing loss (SNHL) of right ear with unrestricted hearing of left ear 06/03/2019  . Tinnitus of right ear 06/03/2019  . Mixed conductive and sensorineural hearing loss of right ear with unrestricted hearing of left ear 05/04/2019  . Keratoma 05/01/2016  . Difficulty walking 05/01/2016  . Neuromyopathy ([place]) 05/01/2016  . 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 Ependymoma of spinal cord ([place]) 03/14/2015  . Acoustic  neuroma ([place]) 03/14/2015  . Atypical intracranial meningioma () 03/14/2015  . H/O arthrodesis 02/17/2014  . Paraparesis ([place]) 02/17/2014  . Kidney lump 07/03/2012  . Neurofibroma of multiple sites ([place]) 09/23/2011   PCP:  Alroy Dust, L.Marlou Sa, MD Pharmacy:   [place], [address] [place] [address] Phone: [phone] Fax: [phone]     Social Determinants of Health (SDOH) Interventions    Readmission Risk Interventions Readmission Risk Prevention Plan 09/08/2020 08/17/2020  Transportation Screening Complete Complete  Medication Review Press photographer) Complete Complete  PCP or Specialist appointment within 3-5 days of discharge Complete Complete  HRI or Home Care Consult Complete Complete  SW Recovery Care/Counseling Consult Complete Complete  Palliative Care Screening Complete Complete  [place] Not Applicable Complete  Some recent data might be hidden

## 2020-09-20 NOTE — Consult Note (Signed)
WOC Nurse Consult Note: Reason for Consult: Pressure injury, Stage 3 Wound type:Pressure Pressure Injury POA: Yes Measurement: 2.5cm x 1.8cm x 0.4cm Wound UKG:URKY pink, moist, no nonviable tissue Drainage (amount, consistency, odor) Moderate amount serous Periwound:intact Dressing procedure/placement/frequency: I will provide guidance for the Nursing staff via the orders for the topical care of this wound. Patient is on a mattress replacement with Low air loss feature and is being turned and repositioned from side to side. Heel boots are in place and heels are intact. We will place a silver hydrofiber (Aquacel Advantage) over the wound and top with a silicone foam. Changes are daily and PRN soiling.  Gerhart's Butt cream is provided to the bilateral inguinal and scrotal areas.  [place] nursing team will not follow, but will remain available to this patient, the nursing and medical teams.  Please re-consult if needed. Thanks, Maudie Flakes, MSN, RN, [place], Arther Abbott  Pager# [phone]

## 2020-09-21 ENCOUNTER — Inpatient Hospital Stay (HOSPITAL_COMMUNITY): Payer: Medicare Other

## 2020-09-21 DIAGNOSIS — R6520 Severe sepsis without septic shock: Secondary | ICD-10-CM

## 2020-09-21 DIAGNOSIS — J9602 Acute respiratory failure with hypercapnia: Secondary | ICD-10-CM | POA: Diagnosis not present

## 2020-09-21 DIAGNOSIS — N390 Urinary tract infection, site not specified: Secondary | ICD-10-CM | POA: Diagnosis not present

## 2020-09-21 DIAGNOSIS — A419 Sepsis, unspecified organism: Secondary | ICD-10-CM | POA: Diagnosis not present

## 2020-09-21 LAB — GLUCOSE, CAPILLARY
Glucose-Capillary: 205 mg/dL — ABNORMAL HIGH (ref 70–99)
Glucose-Capillary: 211 mg/dL — ABNORMAL HIGH (ref 70–99)
Glucose-Capillary: 204 mg/dL — ABNORMAL HIGH (ref 70–99)
Glucose-Capillary: 162 mg/dL — ABNORMAL HIGH (ref 70–99)
Glucose-Capillary: 194 mg/dL — ABNORMAL HIGH (ref 70–99)
Glucose-Capillary: 130 mg/dL — ABNORMAL HIGH (ref 70–99)

## 2020-09-21 LAB — CBC WITH DIFFERENTIAL/PLATELET
Abs Immature Granulocytes: 0.38 10*3/uL — ABNORMAL HIGH (ref 0.00–0.07)
Basophils Absolute: 0 10*3/uL (ref 0.0–0.1)
Basophils Relative: 1 %
Eosinophils Absolute: 0 10*3/uL (ref 0.0–0.5)
Eosinophils Relative: 0 %
HCT: 26.1 % — ABNORMAL LOW (ref 39.0–52.0)
Hemoglobin: 8.6 g/dL — ABNORMAL LOW (ref 13.0–17.0)
Immature Granulocytes: 5 %
Lymphocytes Relative: 3 %
Lymphs Abs: 0.3 10*3/uL — ABNORMAL LOW (ref 0.7–4.0)
MCH: 28.6 pg (ref 26.0–34.0)
MCHC: 33 g/dL (ref 30.0–36.0)
MCV: 86.7 fL (ref 80.0–100.0)
Monocytes Absolute: 0.4 10*3/uL (ref 0.1–1.0)
Monocytes Relative: 5 %
Neutro Abs: 6.5 10*3/uL (ref 1.7–7.7)
Neutrophils Relative %: 86 %
Platelets: 67 10*3/uL — ABNORMAL LOW (ref 150–400)
RBC: 3.01 MIL/uL — ABNORMAL LOW (ref 4.22–5.81)
RDW: 18.7 % — ABNORMAL HIGH (ref 11.5–15.5)
WBC: 7.6 10*3/uL (ref 4.0–10.5)
nRBC: 0.3 % — ABNORMAL HIGH (ref 0.0–0.2)

## 2020-09-21 LAB — BLOOD GAS, ARTERIAL
Acid-base deficit: 10.4 mmol/L — ABNORMAL HIGH (ref 0.0–2.0)
Acid-base deficit: 5.4 mmol/L — ABNORMAL HIGH (ref 0.0–2.0)
Bicarbonate: 22.3 mmol/L (ref 20.0–28.0)
Bicarbonate: 21.2 mmol/L (ref 20.0–28.0)
Drawn by: 331471
Drawn by: 270211
FIO2: 44
FIO2: 40
MECHVT: 580 mL
O2 Content: 6 L/min
O2 Saturation: 93.2 %
O2 Saturation: 97.5 %
PEEP: 5 cmH2O
Patient temperature: 98.6
Patient temperature: 98.6
RATE: 12 resp/min
pCO2 arterial: 89.3 mmHg (ref 32.0–48.0)
pCO2 arterial: 49.9 mmHg — ABNORMAL HIGH (ref 32.0–48.0)
pH, Arterial: 7.027 — CL (ref 7.350–7.450)
pH, Arterial: 7.252 — ABNORMAL LOW (ref 7.350–7.450)
pO2, Arterial: 97.4 mmHg (ref 83.0–108.0)
pO2, Arterial: 96.6 mmHg (ref 83.0–108.0)

## 2020-09-21 LAB — COMPREHENSIVE METABOLIC PANEL
ALT: 68 U/L — ABNORMAL HIGH (ref 0–44)
AST: 29 U/L (ref 15–41)
Albumin: 1.6 g/dL — ABNORMAL LOW (ref 3.5–5.0)
Alkaline Phosphatase: 77 U/L (ref 38–126)
Anion gap: 8 (ref 5–15)
BUN: 33 mg/dL — ABNORMAL HIGH (ref 6–20)
CO2: 21 mmol/L — ABNORMAL LOW (ref 22–32)
Calcium: 7.8 mg/dL — ABNORMAL LOW (ref 8.9–10.3)
Chloride: 112 mmol/L — ABNORMAL HIGH (ref 98–111)
Creatinine, Ser: 0.36 mg/dL — ABNORMAL LOW (ref 0.61–1.24)
GFR, Estimated: >60 mL/min (ref >60)
Glucose, Bld: 224 mg/dL — ABNORMAL HIGH (ref 70–99)
Potassium: 2.8 mmol/L — ABNORMAL LOW (ref 3.5–5.1)
Sodium: 141 mmol/L (ref 135–145)
Total Bilirubin: 0.2 mg/dL — ABNORMAL LOW (ref 0.3–1.2)
Total Protein: 4.3 g/dL — ABNORMAL LOW (ref 6.5–8.1)

## 2020-09-21 LAB — URINE CULTURE: Culture: >=100000 — AB

## 2020-09-21 LAB — CULTURE, BLOOD (ROUTINE X 2)
Special Requests: ADEQUATE
Special Requests: ADEQUATE

## 2020-09-21 MED ORDER — DOCUSATE SODIUM 50 MG/5ML PO LIQD
100.0000 mg | Freq: Two times a day (BID) | ORAL | Status: DC
  Filled 2020-09-21 (×2): qty 10

## 2020-09-21 MED ORDER — NOREPINEPHRINE 4 MG/250ML-% IV SOLN
2.0000 ug/min | INTRAVENOUS | Status: DC
  Administered 2020-09-21: 2 ug/min via INTRAVENOUS

## 2020-09-21 MED ORDER — NOREPINEPHRINE 4 MG/250ML-% IV SOLN
INTRAVENOUS | Status: AC
  Filled 2020-09-21: qty 250

## 2020-09-21 MED ORDER — ETOMIDATE 2 MG/ML IV SOLN
20.0000 mg | Freq: Once | INTRAVENOUS | Status: AC
  Administered 2020-09-21: 20 mg via INTRAVENOUS

## 2020-09-21 MED ORDER — POTASSIUM CHLORIDE 10 MEQ/100ML IV SOLN
10.0000 meq | INTRAVENOUS | Status: AC
  Administered 2020-09-21 (×4): 10 meq via INTRAVENOUS
  Filled 2020-09-21 (×4): qty 100

## 2020-09-21 MED ORDER — NOREPINEPHRINE 4 MG/250ML-% IV SOLN: 0.0000 ug/min | INTRAVENOUS | Status: DC

## 2020-09-21 MED ORDER — POLYETHYLENE GLYCOL 3350 17 G PO PACK: 17.0000 g | PACK | Freq: Every day | ORAL | Status: DC

## 2020-09-21 MED ORDER — FENTANYL CITRATE (PF) 100 MCG/2ML IJ SOLN
INTRAMUSCULAR | Status: AC
  Filled 2020-09-21: qty 2

## 2020-09-21 MED ORDER — ETOMIDATE 2 MG/ML IV SOLN
INTRAVENOUS | Status: AC
  Filled 2020-09-21: qty 20

## 2020-09-21 MED ORDER — FENTANYL CITRATE (PF) 100 MCG/2ML IJ SOLN
100.0000 ug | Freq: Once | INTRAMUSCULAR | Status: AC
  Administered 2020-09-21: 100 ug via INTRAVENOUS

## 2020-09-21 MED ORDER — POTASSIUM CHLORIDE 20 MEQ/15ML (10%) PO SOLN
40.0000 meq | Freq: Once | ORAL | Status: AC
  Administered 2020-09-21: 40 meq
  Filled 2020-09-21: qty 30

## 2020-09-21 MED ORDER — DOCUSATE SODIUM 50 MG/5ML PO LIQD: 100.0000 mg | Freq: Two times a day (BID) | ORAL | Status: DC

## 2020-09-21 MED ORDER — FENTANYL CITRATE (PF) 100 MCG/2ML IJ SOLN
25.0000 ug | INTRAMUSCULAR | Status: AC | PRN
  Administered 2020-09-25 – 2020-10-04 (×3): 25 ug via INTRAVENOUS
  Filled 2020-09-21 (×3): qty 2

## 2020-09-21 MED ORDER — INSULIN ASPART 100 UNIT/ML ~~LOC~~ SOLN
0.0000 [IU] | SUBCUTANEOUS | Status: DC
  Administered 2020-09-21: 1 [IU] via SUBCUTANEOUS
  Administered 2020-09-21 (×2): 2 [IU] via SUBCUTANEOUS
  Administered 2020-09-22 – 2020-09-24 (×5): 1 [IU] via SUBCUTANEOUS
  Administered 2020-09-25: 2 [IU] via SUBCUTANEOUS
  Administered 2020-09-25: 1 [IU] via SUBCUTANEOUS
  Administered 2020-09-25: 2 [IU] via SUBCUTANEOUS
  Administered 2020-09-25 – 2020-10-07 (×21): 1 [IU] via SUBCUTANEOUS

## 2020-09-21 MED ORDER — FENTANYL CITRATE (PF) 100 MCG/2ML IJ SOLN
25.0000 ug | INTRAMUSCULAR | Status: DC | PRN
  Administered 2020-09-22: 25 ug via INTRAVENOUS
  Administered 2020-09-30: 50 ug via INTRAVENOUS
  Filled 2020-09-21 (×2): qty 2

## 2020-09-21 MED ORDER — ROCURONIUM BROMIDE 50 MG/5ML IV SOLN: 60.0000 mg | Freq: Once | INTRAVENOUS | Status: AC

## 2020-09-21 MED ORDER — DEXAMETHASONE 1 MG/ML PO CONC
2.0000 mg | Freq: Every day | ORAL | Status: DC
  Administered 2020-09-21 – 2020-10-10 (×20): 2 mg
  Filled 2020-09-21 (×25): qty 2

## 2020-09-21 MED ORDER — ROCURONIUM BROMIDE 10 MG/ML (PF) SYRINGE
PREFILLED_SYRINGE | INTRAVENOUS | Status: AC
  Administered 2020-09-21: 60 mg
  Filled 2020-09-21: qty 10

## 2020-09-21 MED ORDER — CHLORHEXIDINE GLUCONATE 0.12 % MT SOLN
OROMUCOSAL | Status: AC
  Filled 2020-09-21: qty 15

## 2020-09-21 MED ORDER — FREE WATER
200.0000 mL | Freq: Four times a day (QID) | Status: DC
  Administered 2020-09-21 – 2020-10-04 (×52): 200 mL

## 2020-09-21 MED ORDER — FUROSEMIDE 10 MG/ML IJ SOLN
40.0000 mg | Freq: Once | INTRAMUSCULAR | Status: AC
  Administered 2020-09-21: 40 mg via INTRAVENOUS
  Filled 2020-09-21: qty 4

## 2020-09-21 MED ORDER — ACETAMINOPHEN 160 MG/5ML PO SOLN
650.0000 mg | Freq: Four times a day (QID) | ORAL | Status: DC | PRN
  Administered 2020-09-22 (×2): 650 mg
  Filled 2020-09-21 (×2): qty 20.3

## 2020-09-21 MED ORDER — SODIUM CHLORIDE 0.9 % IV SOLN
250.0000 mL | INTRAVENOUS | Status: DC
  Administered 2020-09-23 – 2020-10-05 (×5): 250 mL via INTRAVENOUS

## 2020-09-21 NOTE — Progress Notes (Signed)
NAME:  Cameron Hale, [name]:  [id number], DOB:  01-May-1976, LOS: 3 ADMISSION DATE:  09/22/2020, CONSULTATION DATE:  09/19/2020 REFERRING MD:  Dr. Dyann Kief, CHIEF COMPLAINT:  Sepsis   Brief History   44 yo Male hx of quadriplegia and recent intubation, admitted to [facility] on 11/1 for AMS and ARF with hypoxia requiring intubation.    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 G-tube (09/05/2020)   Significant Hospital Events   Admitted to [facility] 11/1>>11/2 Transferred to [facility] 11/2>>, on pressors. 11/3: Pressors off.  Growing K pneumoniae in blood and urine, antibiotics narrowed. EEG obtained. Was neg for seizure Triggered apnea alarm on vent. Fent gtt stopped. Not able to wean  11/4 more awake. On SBT volumes look good. Ready for extubation   Consults:  PCCM  Procedures:  Intubated 11/2 >>11/4  Significant Diagnostic Tests:  11/1 CT >> extensive meningiomatosis noted unchanged. Bifrontal edema is stable. No acute intracranial abnormality.  11/3 EEG: no seizure   Micro Data:  11/1 Flu >> negative 11/1 Covid >> negative 11/1 Blood culture >> K pneumoniae 11/1 Urine culture >> K pneumoniae  Antimicrobials:  11/1 Cefepime >> 11/2 Azactam 11/2 11/2 Metronidazole >> 11/3 11/2 Vancomycin >> 11/3  Interim history/subjective:  Awake. Not following commands but tracking Passed SBT  Objective   Blood pressure 107/67, pulse 76, temperature 97.7 F (36.5 C), temperature source Axillary, resp. rate 12, height 5\' 10"  (1.778 m), weight 72.4 kg, SpO2 95 %.    Vent Mode: PRVC FiO2 (%):  [30 %-40 %] 30 % Set Rate:  [12 bmp-16 bmp] 12 bmp Vt Set:  [580 mL] 580 mL PEEP:  [5 cmH20] 5 cmH20 Plateau Pressure:  [11 cmH20-16 cmH20] 14 cmH20   Intake/Output Summary (Last 24 hours) at 09/21/2020 0809 Last data filed at 09/21/2020 3154 Gross per 24 hour  Intake 1875.48  ml  Output 925 ml  Net 950.48 ml   Filed Weights   09/27/2020 1822 09/20/20 0444  Weight: 72.4 kg 72.4 kg    Examination:  General this is a 44 year old white male. He is currently on PSV. Vts 400s RR 20 no accessory use is appreciated.  HENT NCAT orally intubated. Sclera are not icteric Pulm scattered rhonchi w/ equal chest rise Card RRR abd soft  Ext diffuse anasarca brisk CR. Pulses are strong GU cnc yellow Neuro eyes open. Not following commands. quadriplegic   Resolved Hospital Problem list    Hypothermia Hyponatremia Hypoglycemia   Assessment & Plan:  Sepsis/septic shock; Urinary tract source w/ associated K Pneumoniae bacteremia  -shock resolving, off norepi this am Plan abx day 4. Now on azactam. Will repeat blood cultures.  Change stress dose steroids back to decadron VT  Acute respiratory failure  Left lung collapse/possible mucus plug due to neuromuscular weakness, made worse in setting of sepsis -PCXR w/ L>R airspace disease. Perhaps a little better aerated on left c/w prior film. There may be element of effusion.  -passed SBT. He is encephalopathic but more awake Plan Extubate NPO Pulse ox Wean oxygen Lasix x1 Might need trach if re-intubated.   Acute metabolic encephalopathy superimposed on Neurofibroma of multiple sites ([place]) Quadriplegia ([place]) -working dx is that this is UTI/sepsis driving acute Mental status change. More awake today off sedating gtts  Plan Supportive care No sedating meds  Seizures ([place]) Has a tremor; EEG obtained 11/3 and there was diffuse encephalopathy but no active seizure  Plan Cont keppra   Fluid and electrolyte imbalance: hypokalemia, hyperchloremia, w/ associated hyperchloremia Plan Replace K Replace free water Serial chemistries.  Strict I&O  Symptomatic anemia w/ Positive fecal occult blood Thombrocytopenia -positive fecal occult blood X 2 Got 2 units of blood given 11/1 for Hb 6.2 has now been stable for  about 48 hrs w/ no evidence of acute bleeding and only small hgb drift PLTs trending down.  Plan Cont PPI BID Cont FESO4 Would only consult GI if had sig hgb drop as they have recommended medical RX unless clearly bleeding   Severe protein-calorie malnutrition ([place]) Plan tubefeeds per PEG  Liquids stools Plan Holding stool softeners and laxatives   Hyperglycemia Plan ssi  Dec steroids May need to add basal coverage  Goal 140-180  Best practice:  Diet: NPO-->tubefeeds Pain/Anxiety/Delirium protocol (if indicated): Versed, fentanyl-->stopped 11/3 VAP protocol (if indicated): yes-->stop 11/3 DVT prophylaxis: SCDs (Hb low and positive iFOB) GI prophylaxis: protonix Glucose control: ssi Mobility: bed rest Code Status: Full Family Communication:father updated 11/3 Disposition:  Continue ICU care   Critical care time: 32 min      Erick Colace ACNP-BC [place] Pager # [phone] OR # [phone] if no answer

## 2020-09-21 NOTE — Discharge Instructions (Signed)
Acute Respiratory Failure, Adult  Acute respiratory failure occurs when there is not enough oxygen passing from your lungs to your body. When this happens, your lungs have trouble removing carbon dioxide from the blood. This causes your blood oxygen level to drop too low as carbon dioxide builds up. Acute respiratory failure is a medical emergency. It can develop quickly, but it is temporary if treated promptly. Your lung capacity, or how much air your lungs can hold, may improve with time, exercise, and treatment. What are the causes? There are many possible causes of acute respiratory failure, including:  Lung injury.  Chest injury or damage to the ribs or tissues near the lungs.  Lung conditions that affect the flow of air and blood into and out of the lungs, such as pneumonia, acute respiratory distress syndrome, and cystic fibrosis.  Medical conditions, such as strokes or spinal cord injuries, that affect the muscles and nerves that control breathing.  Blood infection (sepsis).  Inflammation of the pancreas (pancreatitis).  A blood clot in the lungs (pulmonary embolism).  A large-volume blood transfusion.  Burns.  Near-drowning.  Seizure.  Smoke inhalation.  Reaction to medicines.  Alcohol or drug overdose. What increases the risk? This condition is more likely to develop in people who have:  A blocked airway.  Asthma.  A condition or disease that damages or weakens the muscles, nerves, bones, or tissues that are involved in breathing.  A serious infection.  A health problem that blocks the unconscious reflex that is involved in breathing, such as hypothyroidism or sleep apnea.  A lung injury or trauma. What are the signs or symptoms? Trouble breathing is the main symptom of acute respiratory failure. Symptoms may also include:  Rapid breathing.  Restlessness or anxiety.  Skin, lips, or fingernails that appear blue (cyanosis).  Rapid heart  rate.  Abnormal heart rhythms (arrhythmias).  Confusion or changes in behavior.  Tiredness or loss of energy.  Feeling sleepy or having a loss of consciousness. How is this diagnosed? Your health care provider can diagnose acute respiratory failure with a medical history and physical exam. During the exam, your health care provider will listen to your heart and check for crackling or wheezing sounds in your lungs. Your may also have tests to confirm the diagnosis and determine what is causing respiratory failure. These tests may include:  Measuring the amount of oxygen in your blood (pulse oximetry). The measurement comes from a small device that is placed on your finger, earlobe, or toe.  Other blood tests to measure blood gases and to look for signs of infection.  Sampling your cerebral spinal fluid or tracheal fluid to check for infections.  Chest X-ray to look for fluid in spaces that should be filled with air.  Electrocardiogram (ECG) to look at the heart's electrical activity. How is this treated? Treatment for this condition usually takes places in a hospital intensive care unit (ICU). Treatment depends on what is causing the condition. It may include one or more treatments until your symptoms improve. Treatment may include:  Supplemental oxygen. Extra oxygen is given through a tube in the nose, a face mask, or a hood.  A device such as a continuous positive airway pressure (CPAP) or bi-level positive airway pressure (BiPAP or BPAP) machine. This treatment uses mild air pressure to keep the airways open. A mask or other device will be placed over your nose or mouth. A tube that is connected to a motor will deliver oxygen through the   mask.  Ventilator. This treatment helps move air into and out of the lungs. This may be done with a bag and mask or a machine. For this treatment, a tube is placed in your windpipe (trachea) so air and oxygen can flow to the lungs.  Extracorporeal  membrane oxygenation (ECMO). This treatment temporarily takes over the function of the heart and lungs, supplying oxygen and removing carbon dioxide. ECMO gives the lungs a chance to recover. It may be used if a ventilator is not effective.  Tracheostomy. This is a procedure that creates a hole in the neck to insert a breathing tube.  Receiving fluids and medicines.  Rocking the bed to help breathing. Follow these instructions at home:  Take over-the-counter and prescription medicines only as told by your health care provider.  Return to normal activities as told by your health care provider. Ask your health care provider what activities are safe for you.  Keep all follow-up visits as told by your health care provider. This is important. How is this prevented? Treating infections and medical conditions that may lead to acute respiratory failure can help prevent the condition from developing. Contact a health care provider if:  You have a fever.  Your symptoms do not improve or they get worse. Get help right away if:  You are having trouble breathing.  You lose consciousness.  Your have cyanosis or turn blue.  You develop a rapid heart rate.  You are confused. These symptoms may represent a serious problem that is an emergency. Do not wait to see if the symptoms will go away. Get medical help right away. Call your local emergency services (911 in the [place] Do not drive yourself to the hospital. This information is not intended to replace advice given to you by your health care provider. Make sure you discuss any questions you have with your health care provider. Document Revised: 10/17/2017 Document Reviewed: 05/22/2016 Elsevier Patient Education  2020 Elsevier Inc.  

## 2020-09-21 NOTE — Progress Notes (Signed)
CRITICAL VALUE ALERT  Critical Value:  PH 7.027    PCO2 89.3 Date & Time Notied:  1218 Provider Notified: Yes  Orders Received/Actions taken: intubation

## 2020-09-21 NOTE — Progress Notes (Signed)
eLink Physician-Brief Progress Note Patient Name: Cameron Hale DOB: 02-20-1976 [name]: [id number]   Date of Service  09/21/2020  HPI/Events of Note  Respiratory Therapy reports increasing copious, tan and thick secretions. Description sounds too thick for tube feeds. Afebrile and WBC = 7.6.  eICU Interventions  Plan: 1. Tracheal aspirate culture now.  2. Portable CXR in AM     Intervention Category Major Interventions: Other:  Lysle Dingwall 09/21/2020, 11:55 PM

## 2020-09-21 NOTE — Procedures (Signed)
Extubation Procedure Note  Patient Details:   Name: Cameron Hale DOB: August 12, 1976 [name]: [id number]   Airway Documentation:    Vent end date: 09/21/20 Vent end time: 0930   Evaluation  O2 sats: stable throughout Complications: No apparent complications Patient did tolerate procedure well. Bilateral Breath Sounds: Diminished, Coarse crackles   Pt placed on 4L nasal cannula  Tamera Reason 09/21/2020, 9:32 AM

## 2020-09-21 NOTE — Progress Notes (Signed)
[facility] ([facility]) Community Based Palliative Care       This patient is enrolled in our palliative care services in the community.  [facility] will continue to follow for any discharge planning needs and to coordinate continuation of palliative care.   If you have questions or need assistance, please call [phone] or contact the hospital Liaison listed on AMION.     Thank you for the opportunity to participate in this patient's care.     Chrislyn King, BSN, RN [facility] Hospital Liaison   [phone]   

## 2020-09-21 NOTE — Procedures (Signed)
Procedure Name: Intubation Date/Time: 09/21/2020 12:42 PM Performed by: Erick Colace, NP Pre-anesthesia Checklist: Emergency Drugs available, Suction available, Patient being monitored, Patient identified and Timeout performed Oxygen Delivery Method: Ambu bag Preoxygenation: Pre-oxygenation with 100% oxygen Induction Type: Rapid sequence Ventilation: Mask ventilation without difficulty Laryngoscope Size: Glidescope and 4 Grade View: Grade II Tube size: 7.5 mm Number of attempts: 1 Placement Confirmation: ETT inserted through vocal cords under direct vision,  Positive ETCO2,  CO2 detector and Breath sounds checked- equal and bilateral Secured at: 23 cm Tube secured with: ETT holder Dental Injury: Teeth and Oropharynx as per pre-operative assessment       Erick Colace ACNP-BC [place] Pager # [phone] OR # [phone] if no answer

## 2020-09-21 NOTE — Progress Notes (Signed)
K+ 2.8 Replaced per protocol

## 2020-09-22 ENCOUNTER — Inpatient Hospital Stay (HOSPITAL_COMMUNITY): Payer: Medicare Other

## 2020-09-22 DIAGNOSIS — R4020 Unspecified coma: Secondary | ICD-10-CM | POA: Diagnosis not present

## 2020-09-22 DIAGNOSIS — J9602 Acute respiratory failure with hypercapnia: Secondary | ICD-10-CM | POA: Diagnosis not present

## 2020-09-22 DIAGNOSIS — A419 Sepsis, unspecified organism: Secondary | ICD-10-CM | POA: Diagnosis not present

## 2020-09-22 LAB — CULTURE, RESPIRATORY W GRAM STAIN: Culture: NORMAL

## 2020-09-22 LAB — GLUCOSE, CAPILLARY
Glucose-Capillary: 108 mg/dL — ABNORMAL HIGH (ref 70–99)
Glucose-Capillary: 108 mg/dL — ABNORMAL HIGH (ref 70–99)
Glucose-Capillary: 110 mg/dL — ABNORMAL HIGH (ref 70–99)
Glucose-Capillary: 115 mg/dL — ABNORMAL HIGH (ref 70–99)
Glucose-Capillary: 122 mg/dL — ABNORMAL HIGH (ref 70–99)
Glucose-Capillary: 123 mg/dL — ABNORMAL HIGH (ref 70–99)
Glucose-Capillary: 124 mg/dL — ABNORMAL HIGH (ref 70–99)
Glucose-Capillary: 92 mg/dL (ref 70–99)

## 2020-09-22 LAB — CBC WITH DIFFERENTIAL/PLATELET
Abs Immature Granulocytes: 0.46 10*3/uL — ABNORMAL HIGH (ref 0.00–0.07)
Basophils Absolute: 0.1 10*3/uL (ref 0.0–0.1)
Basophils Relative: 0 %
Eosinophils Absolute: 0 10*3/uL (ref 0.0–0.5)
Eosinophils Relative: 0 %
HCT: 28.6 % — ABNORMAL LOW (ref 39.0–52.0)
Hemoglobin: 9.1 g/dL — ABNORMAL LOW (ref 13.0–17.0)
Immature Granulocytes: 4 %
Lymphocytes Relative: 7 %
Lymphs Abs: 0.7 10*3/uL (ref 0.7–4.0)
MCH: 28.6 pg (ref 26.0–34.0)
MCHC: 31.8 g/dL (ref 30.0–36.0)
MCV: 89.9 fL (ref 80.0–100.0)
Monocytes Absolute: 0.7 10*3/uL (ref 0.1–1.0)
Monocytes Relative: 6 %
Neutro Abs: 9.5 10*3/uL — ABNORMAL HIGH (ref 1.7–7.7)
Neutrophils Relative %: 83 %
Platelets: 79 10*3/uL — ABNORMAL LOW (ref 150–400)
RBC: 3.18 MIL/uL — ABNORMAL LOW (ref 4.22–5.81)
RDW: 19.2 % — ABNORMAL HIGH (ref 11.5–15.5)
WBC: 11.4 10*3/uL — ABNORMAL HIGH (ref 4.0–10.5)
nRBC: 0.2 % (ref 0.0–0.2)

## 2020-09-22 LAB — BASIC METABOLIC PANEL
Anion gap: 11 (ref 5–15)
BUN: 34 mg/dL — ABNORMAL HIGH (ref 6–20)
CO2: 20 mmol/L — ABNORMAL LOW (ref 22–32)
Calcium: 7.7 mg/dL — ABNORMAL LOW (ref 8.9–10.3)
Chloride: 109 mmol/L (ref 98–111)
Creatinine, Ser: 0.34 mg/dL — ABNORMAL LOW (ref 0.61–1.24)
GFR, Estimated: >60 mL/min (ref >60)
Glucose, Bld: 139 mg/dL — ABNORMAL HIGH (ref 70–99)
Potassium: 4.1 mmol/L (ref 3.5–5.1)
Sodium: 140 mmol/L (ref 135–145)

## 2020-09-22 LAB — TRIGLYCERIDES: Triglycerides: 48 mg/dL (ref <150)

## 2020-09-22 MED ORDER — FLORANEX PO PACK
1.0000 g | PACK | Freq: Three times a day (TID) | ORAL | Status: DC
  Administered 2020-09-22 – 2020-10-10 (×55): 1 g
  Filled 2020-09-22 (×60): qty 1

## 2020-09-22 MED ORDER — VITAL AF 1.2 CAL PO LIQD
1000.0000 mL | ORAL | Status: DC
  Administered 2020-09-22 – 2020-10-04 (×14): 1000 mL

## 2020-09-22 MED ORDER — POTASSIUM CHLORIDE 20 MEQ/15ML (10%) PO SOLN
40.0000 meq | Freq: Once | ORAL | Status: AC
  Administered 2020-09-22: 40 meq
  Filled 2020-09-22: qty 30

## 2020-09-22 MED ORDER — METOPROLOL TARTRATE 5 MG/5ML IV SOLN
INTRAVENOUS | Status: AC
  Filled 2020-09-22: qty 5

## 2020-09-22 MED ORDER — FUROSEMIDE 10 MG/ML IJ SOLN
40.0000 mg | Freq: Once | INTRAMUSCULAR | Status: AC
  Administered 2020-09-22: 40 mg via INTRAVENOUS
  Filled 2020-09-22: qty 4

## 2020-09-22 NOTE — Progress Notes (Signed)
Pharmacy Antibiotic Note  Cameron Hale is a 44 y.o. male admitted on 10/01/2020 with AMS requiring intubation.  Found to have Klebsiella bacteremia/UTI. Pharmacy has been consulted for aztreonam dosing as patient may have had a reaction to cefepime.  Day #5 total abx, #4 aztreonam - Afebrile, Tmin 96.2 - WBC 11.4, elevated on steroids - SCr 0.36, stable  Plan: Continue Aztreonam 2g IV q8h Follow up renal function & cultures Per CCM, plan 7d therapy  Height: 5\' 10"  (177.8 cm) Weight: 72.4 kg (159 lb 9.8 oz) IBW/kg (Calculated) : 73  Temp (24hrs), Avg:97.5 F (36.4 C), Min:96.2 F (35.7 C), Max:99.7 F (37.6 C)  Recent Labs  Lab 09/27/2020 1802 10/10/2020 1952 09/19/20 0332 09/20/20 0308 09/21/20 0248 09/22/20 0248  WBC 5.2  --  4.5 9.7 7.6 11.4*  CREATININE <0.30*  --  <0.30* 0.32* 0.36*  --   LATICACIDVEN 0.9 1.2  --   --   --   --     Estimated Creatinine Clearance: 119.4 mL/min (A) (by C-G formula based on SCr of 0.36 mg/dL (L)).    Allergies  Allergen Reactions  . Amoxicillin Itching  . Maxipime [Cefepime] Swelling    Facial and eye   . Morphine Itching    Antimicrobials this admission:  11/1 Vanc >> 11/2 11/1 Cefepime >> 11/2 11/1 Flagyl >> 11/3 11/2 Aztreonam >>  Dose adjustments this admission:   Microbiology results:  11/1 Flu/COVID: neg 11/1 BCx: 2/4 bottles GNR, BCID = K. Pneumoniae - resistant amp, ancef, bactrim, unasyn, per MIC intermediate to Cipro 11/1 UCx: >100k Klebsiella pneumonia (R amp, bactrim, I nitro) 11/2 MRSA PCR: neg 11/2 TA: reincubating 11/4 repeat BCx: ngtd 11/5 TA: few GPC  Thank you for allowing pharmacy to be a part of this patient's care.  Peggyann Juba, PharmD, BCPS Pharmacy: [phone] 09/22/2020 7:34 AM

## 2020-09-22 NOTE — Progress Notes (Addendum)
Goals of care  Long discussion w/ patient's wife, mother and step-dad  -I discussed:  Trach  Vent weaning Current infection  Neuro-muscular weakness Acute delirium Risk for re-intubation   The family had several concerns.  1) they felt they wanted more MD input (the mother specifically) was frustrated that NPs had been taking care of him and felt he should have a doctor. The mother stated that she felt things would be different at [facility]. I told her that we would still be the exact same team and that transfer was not a option 2) they all have said they do not think he wants a tracheostomy and they do not think the mom and step dad could care for a trach 3) they feel infection is the primary issue and even though we know what bacteria we are treating they have requested we consult infectious disease 4) they want to get him home I think that they understand this will happen again but in spite of them verbalizing he would not want invasive treatments they still would have [place] put him on the vent again. He remains full code.  I re-challenged them to ask each other at what point would we be doing more to him than for him. I feel we are close to this but it seems as though they still need more time for this to sink in.   60 minutes  Erick Colace ACNP-BC [place] Pager # [phone] OR # [phone] if no answer

## 2020-09-22 NOTE — Consult Note (Signed)
[facility] for Infectious Disease  Total days of antibiotics 5               Reason for Consult: bacteremia    Referring Physician: [email or web address]  Principal Problem:   Sepsis due to undetermined organism [facility]) Active Problems:   Neurofibroma of multiple sites ([place])   Quadriplegia ([place])   Hyponatremia   Seizures ([place])   AMS (altered mental status)   GI bleed   Symptomatic anemia   Severe protein-calorie malnutrition ([place])   Sepsis with acute organ dysfunction without septic shock ([place])   Acute respiratory failure (HCC)   Septic shock (HCC)   Pressure injury of skin   Urinary tract infection without hematuria    HPI: ODARIUS DINES is a 44 y.o. 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 At this stay he failed swallow study where he received PEG for enteric feeds. Also diagnosed with cerebral thrombosis and acute ischemic infarcts on 9/26 now on aspirin. He was readmitted on 11/1 for 2 day history of worsening AMS(hallucinations per his wife's report) and the solomnent/unresponsive where he was brought in by EMS. His initial ABG showed respiratory hypercarbic acidosis, with hypothermia and hypotension, BP of 75/40. No hx of fever, N/V/D per family. He does take midodrine prior to admit. He was intubated as wel las started on broad spectrum abtx and fluid resuscitation due to concern for sepsis. His UA was turbid with +nitrates/LE, WBC clumping. His infectious work up revealed + urine and blood cx for klebsiella. His initial cxr showed mild basilar atelectasis but was not hypoxic. His abtx were narrowed  to cefepime however he started to have immediate facial rash/flushing concerning for drug allergy. He was then converted to aztreonam. He briefly was extubated but necessitated getting reintubated. Latest cxr showing right middle lung infiltrate. No witnessed aspiration during the time he was extubated. Wbc trending up in the last 2 days.Repeat blood cx thus far NGTD at 24 hr.   Past Medical History:  Diagnosis Date  . Hard of hearing   . NF2 (neurofibromatosis 2) ([place])   . Pressure ulcer   . Quadriplegia ([place])   . Seizures ([place])   . Vestibular schwannoma (HCC)     Allergies:  Allergies  Allergen Reactions  . Amoxicillin Itching  . Maxipime [Cefepime] Swelling    Facial and eye   . Morphine Itching     MEDICATIONS: . vitamin C  250 mg Per Tube Daily  . chlorhexidine gluconate (MEDLINE KIT)  15 mL Mouth Rinse BID  . Chlorhexidine Gluconate Cloth  6 each Topical Daily  . dexamethasone  2 mg Per Tube Daily  . ferrous sulfate  300 mg Per Tube Q breakfast  . free water  200 mL Per Tube Q6H  . Gerhardt's butt cream   Topical TID  . insulin aspart  0-9 Units Subcutaneous Q4H  . ipratropium-albuterol  3 mL Nebulization Q6H  . lactobacillus  1 g Per Tube Q8H  . levETIRAcetam  1,500 mg Per Tube BID  . mouth rinse  15 mL Mouth Rinse 10 times per day  . metoprolol tartrate      . midodrine  10 mg Per Tube TID WC  . pantoprazole (PROTONIX) IV  40 mg Intravenous Q12H    Social History   Tobacco Use  . Smoking status: Never Smoker  . Smokeless tobacco: Never Used  Vaping Use  . Vaping Use: Never used  Substance Use Topics  . Alcohol use: Not Currently    Alcohol/week: 0.0 standard drinks  . Drug use: Never    No family history on file.  Review of Systems - unable to obtain since patient is intubated/sedated/   OBJECTIVE: Temp:  [95.8 F (35.4 C)-99.7 F (37.6 C)] 96 F (35.6 C) (11/05 1200) Pulse Rate:  [70-105] 70 (11/05 1124) Resp:  [12-19] 13 (11/05 1124) BP:  (109-146)/(57-91) 146/86 (11/05 0900) SpO2:  [95 %-99 %] 99 % (11/05 1459) FiO2 (%):  [30 %-40 %] 30 % (11/05 1459) Physical Exam  Constitutional: eye intermittently open to voice. He appears well-developed and well-nourished. No distress.  HENT: subconjunctival edema bilaterally Mouth/Throat: OETT in place Cardiovascular: Normal rate, regular rhythm and normal heart sounds. Exam reveals no gallop and no friction rub.  No murmur heard.  Pulmonary/Chest: Effort normal and breath sounds normal. No respiratory distress. He has no wheezes.  Abdominal: Soft. Bowel sounds are normal. He exhibits no distension. There is no tenderness.  Neurological: no independent movement of extremities Skin: Skin is warm and dry. No rash noted. No erythema.    LABS: Results for orders placed or performed during the hospital encounter of 10/07/2020 (from the past 48 hour(s))  Glucose, capillary     Status: Abnormal   Collection Time: 09/20/20  3:59 PM  Result Value Ref Range   Glucose-Capillary 120 (H) 70 - 99 mg/dL    Comment: Glucose reference range applies only to samples taken after fasting for at least 8 hours.   Comment 1 Notify RN    Comment 2 Document in Chart   Magnesium     Status: None   Collection Time: 09/20/20  5:11 PM  Result Value Ref Range   Magnesium 1.9 1.7 - 2.4 mg/dL    Comment: Performed at [facility], [place] [address] [place], [place] [address]  Phosphorus     Status: None   Collection Time: 09/20/20  5:11 PM  Result Value Ref Range   Phosphorus 3.2 2.5 - 4.6 mg/dL    Comment: Performed at [facility], [place] [address], [place], [place] [address]  Glucose, capillary     Status: Abnormal   Collection Time: 09/20/20  8:36 PM  Result Value Ref Range   Glucose-Capillary 163 (H) 70 - 99 mg/dL    Comment: Glucose reference range applies only to samples taken after fasting for at least 8 hours.  Glucose, capillary     Status: Abnormal   Collection  Time: 09/21/20  1:08 AM  Result Value Ref Range   Glucose-Capillary 205 (H) 70 - 99 mg/dL    Comment: Glucose reference range applies only to samples taken after fasting for at least 8 hours.  CBC with Differential     Status: Abnormal   Collection Time: 09/21/20  2:48 AM  Result Value Ref Range   WBC 7.6 4.0 - 10.5 K/uL   RBC 3.01 (L) 4.22 - 5.81 MIL/uL   Hemoglobin 8.6 (L) 13.0 - 17.0 g/dL   HCT 26.1 (L) 39 - 52 %   MCV 86.7 80.0 - 100.0 fL   MCH 28.6 26.0 - 34.0 pg   MCHC 33.0 30.0 - 36.0 g/dL  RDW 18.7 (H) 11.5 - 15.5 %   Platelets 67 (L) 150 - 400 K/uL    Comment: REPEATED TO VERIFY Immature Platelet Fraction may be clinically indicated, consider ordering this additional test [id number] CONSISTENT WITH PREVIOUS RESULT    nRBC 0.3 (H) 0.0 - 0.2 %   Neutrophils Relative % 86 %   Neutro Abs 6.5 1.7 - 7.7 K/uL   Lymphocytes Relative 3 %   Lymphs Abs 0.3 (L) 0.7 - 4.0 K/uL   Monocytes Relative 5 %   Monocytes Absolute 0.4 0.1 - 1.0 K/uL   Eosinophils Relative 0 %   Eosinophils Absolute 0.0 0.0 - 0.5 K/uL   Basophils Relative 1 %   Basophils Absolute 0.0 0.0 - 0.1 K/uL   WBC Morphology MILD LEFT SHIFT (1-5% METAS, OCC MYELO, OCC BANDS)    Immature Granulocytes 5 %   Abs Immature Granulocytes 0.38 (H) 0.00 - 0.07 K/uL   Polychromasia PRESENT    Target Cells PRESENT    Ovalocytes PRESENT     Comment: Performed at [facility], [place] [address] [place], [place] [address]  Comprehensive metabolic panel     Status: Abnormal   Collection Time: 09/21/20  2:48 AM  Result Value Ref Range   Sodium 141 135 - 145 mmol/L   Potassium 2.8 (L) 3.5 - 5.1 mmol/L    Comment: DELTA CHECK NOTED   Chloride 112 (H) 98 - 111 mmol/L   CO2 21 (L) 22 - 32 mmol/L   Glucose, Bld 224 (H) 70 - 99 mg/dL    Comment: Glucose reference range applies only to samples taken after fasting for at least 8 hours.   BUN 33 (H) 6 - 20 mg/dL   Creatinine, Ser 0.36 (L) 0.61 - 1.24 mg/dL    Calcium 7.8 (L) 8.9 - 10.3 mg/dL   Total Protein 4.3 (L) 6.5 - 8.1 g/dL   Albumin 1.6 (L) 3.5 - 5.0 g/dL   AST 29 15 - 41 U/L   ALT 68 (H) 0 - 44 U/L   Alkaline Phosphatase 77 38 - 126 U/L   Total Bilirubin 0.2 (L) 0.3 - 1.2 mg/dL   GFR, Estimated >60 >60 mL/min    Comment: (NOTE) Calculated using the CKD-EPI Creatinine Equation (2021)    Anion gap 8 5 - 15    Comment: Performed at [facility], [place] [address], [place], [place] [address]  Glucose, capillary     Status: Abnormal   Collection Time: 09/21/20  4:17 AM  Result Value Ref Range   Glucose-Capillary 211 (H) 70 - 99 mg/dL    Comment: Glucose reference range applies only to samples taken after fasting for at least 8 hours.  Glucose, capillary     Status: Abnormal   Collection Time: 09/21/20  7:38 AM  Result Value Ref Range   Glucose-Capillary 204 (H) 70 - 99 mg/dL    Comment: Glucose reference range applies only to samples taken after fasting for at least 8 hours.   Comment 1 Notify RN    Comment 2 Document in Chart   Culture, blood (Routine X 2) w Reflex to ID Panel     Status: None (Preliminary result)   Collection Time: 09/21/20 10:37 AM   Specimen: BLOOD RIGHT HAND  Result Value Ref Range   Specimen Description      BLOOD RIGHT HAND Performed at [facility] [address], [place], [place] [address]    Special Requests  BOTTLES DRAWN AEROBIC AND ANAEROBIC Blood Culture adequate volume Performed at [facility] [address] [place], [place] [address]    Culture      NO GROWTH < 24 HOURS Performed at [facility] [address], [place], [place] [address]    Report Status PENDING   Culture, blood (Routine X 2) w Reflex to ID Panel     Status: None (Preliminary result)   Collection Time: 09/21/20 10:46 AM   Specimen: BLOOD LEFT HAND  Result Value Ref Range   Specimen Description      BLOOD LEFT HAND Performed at [facility] [address], [place], [place] [address]    Special Requests      BOTTLES DRAWN AEROBIC AND ANAEROBIC Blood Culture adequate volume Performed at [facility] [address], [place], [place] [address]    Culture      NO GROWTH < 24 HOURS Performed at [facility] [address] [place], [place] [address]    Report Status PENDING   Glucose, capillary     Status: Abnormal   Collection Time: 09/21/20 11:23 AM  Result Value Ref Range   Glucose-Capillary 162 (H) 70 - 99 mg/dL    Comment: Glucose reference range applies only to samples taken after fasting for at least 8 hours.  Blood gas, arterial     Status: Abnormal   Collection Time: 09/21/20 11:55 AM  Result Value Ref Range   FIO2 44.00    O2 Content 6.0 L/min   Delivery systems NASAL CANNULA    pH, Arterial 7.027 (LL) 7.35 - 7.45    Comment: CRITICAL RESULT CALLED TO, READ BACK BY AND VERIFIED WITH: GARCIA,T. RN AT 1215 09/21/20 MULLINS,T    pCO2 arterial 89.3 (HH) 32 - 48 mmHg    Comment: CRITICAL RESULT CALLED TO, READ BACK BY AND VERIFIED WITH: GARCIA,T. RN AT 1215 09/21/20 MULLINS,T    pO2, Arterial 97.4 83 - 108 mmHg   Bicarbonate 22.3 20.0 - 28.0 mmol/L   Acid-base deficit 10.4 (H) 0.0 - 2.0 mmol/L   O2 Saturation 93.2 %   Patient temperature 98.6    Collection site RIGHT RADIAL    Drawn by [id number]    Allens test (pass/fail) PASS PASS    Comment: Performed at [facility], [place] [address], [place], [place] [address]  Draw ABG 1 hour after initiation of ventilator     Status: Abnormal   Collection Time: 09/21/20  1:55 PM  Result Value Ref Range   FIO2 40.00    Mode PRESSURE REGULATED VOLUME CONTROL    VT 580 mL   LHR 12 resp/min   Peep/cpap 5.0 cm H20   pH, Arterial 7.252 (L) 7.35 - 7.45   pCO2 arterial 49.9 (H) 32 - 48 mmHg   pO2, Arterial 96.6 83 - 108 mmHg   Bicarbonate 21.2 20.0 - 28.0 mmol/L   Acid-base deficit 5.4 (H) 0.0 - 2.0 mmol/L   O2  Saturation 97.5 %   Patient temperature 98.6    Collection site RIGHT RADIAL    Drawn by [id number]    Sample type ARTERIAL    Allens test (pass/fail) PASS PASS    Comment: Performed at [facility], [place] [address], [place], [place] [address]  Glucose, capillary     Status: Abnormal   Collection Time: 09/21/20  3:29 PM  Result Value Ref Range   Glucose-Capillary 194 (H) 70 - 99  mg/dL    Comment: Glucose reference range applies only to samples taken after fasting for at least 8 hours.   Comment 1 Notify RN    Comment 2 Document in Chart   Glucose, capillary     Status: Abnormal   Collection Time: 09/21/20  7:55 PM  Result Value Ref Range   Glucose-Capillary 130 (H) 70 - 99 mg/dL    Comment: Glucose reference range applies only to samples taken after fasting for at least 8 hours.  Glucose, capillary     Status: Abnormal   Collection Time: 09/22/20 12:03 AM  Result Value Ref Range   Glucose-Capillary 124 (H) 70 - 99 mg/dL    Comment: Glucose reference range applies only to samples taken after fasting for at least 8 hours.   Comment 1 Notify RN    Comment 2 Document in Chart   Culture, respiratory (non-expectorated)     Status: None (Preliminary result)   Collection Time: 09/22/20 12:17 AM   Specimen: Tracheal Aspirate; Respiratory  Result Value Ref Range   Specimen Description      TRACHEAL ASPIRATE Performed at [facility] [address], [place], [place] [address]    Special Requests      Normal Performed at [facility], [place] [address], [place], [place] [address]    Gram Stain      MODERATE WBC PRESENT, PREDOMINANTLY PMN FEW SQUAMOUS EPITHELIAL CELLS PRESENT FEW GRAM POSITIVE COCCI Performed at [facility], [place] [address], [place], [place] [address]    Culture PENDING    Report Status PENDING   Triglycerides     Status: None   Collection Time: 09/22/20  2:48 AM  Result Value Ref Range   Triglycerides 48  <150 mg/dL    Comment: Performed at [facility], [place] [address] [place], [place] [address]  CBC with Differential     Status: Abnormal   Collection Time: 09/22/20  2:48 AM  Result Value Ref Range   WBC 11.4 (H) 4.0 - 10.5 K/uL   RBC 3.18 (L) 4.22 - 5.81 MIL/uL   Hemoglobin 9.1 (L) 13.0 - 17.0 g/dL   HCT 28.6 (L) 39 - 52 %   MCV 89.9 80.0 - 100.0 fL   MCH 28.6 26.0 - 34.0 pg   MCHC 31.8 30.0 - 36.0 g/dL   RDW 19.2 (H) 11.5 - 15.5 %   Platelets 79 (L) 150 - 400 K/uL    Comment: Immature Platelet Fraction may be clinically indicated, consider ordering this additional test [id number]    nRBC 0.2 0.0 - 0.2 %   Neutrophils Relative % 83 %   Neutro Abs 9.5 (H) 1.7 - 7.7 K/uL   Lymphocytes Relative 7 %   Lymphs Abs 0.7 0.7 - 4.0 K/uL   Monocytes Relative 6 %   Monocytes Absolute 0.7 0.1 - 1.0 K/uL   Eosinophils Relative 0 %   Eosinophils Absolute 0.0 0.0 - 0.5 K/uL   Basophils Relative 0 %   Basophils Absolute 0.1 0.0 - 0.1 K/uL   WBC Morphology MILD LEFT SHIFT (1-5% METAS, OCC MYELO, OCC BANDS)    Immature Granulocytes 4 %   Abs Immature Granulocytes 0.46 (H) 0.00 - 0.07 K/uL   Ovalocytes PRESENT     Comment: Performed at [facility], [place] [address] [place], [place] [address]  Glucose, capillary     Status: Abnormal   Collection Time: 09/22/20  3:39 AM  Result Value Ref Range  Glucose-Capillary 110 (H) 70 - 99 mg/dL    Comment: Glucose reference range applies only to samples taken after fasting for at least 8 hours.  Glucose, capillary     Status: Abnormal   Collection Time: 09/22/20  4:19 AM  Result Value Ref Range   Glucose-Capillary 123 (H) 70 - 99 mg/dL    Comment: Glucose reference range applies only to samples taken after fasting for at least 8 hours.   Comment 1 Notify RN    Comment 2 Document in Chart   Basic metabolic panel     Status: Abnormal   Collection Time: 09/22/20  7:51 AM  Result Value Ref Range   Sodium 140 135 -  145 mmol/L   Potassium 4.1 3.5 - 5.1 mmol/L    Comment: DELTA CHECK NOTED NO VISIBLE HEMOLYSIS    Chloride 109 98 - 111 mmol/L   CO2 20 (L) 22 - 32 mmol/L   Glucose, Bld 139 (H) 70 - 99 mg/dL    Comment: Glucose reference range applies only to samples taken after fasting for at least 8 hours.   BUN 34 (H) 6 - 20 mg/dL   Creatinine, Ser 0.34 (L) 0.61 - 1.24 mg/dL   Calcium 7.7 (L) 8.9 - 10.3 mg/dL   GFR, Estimated >60 >60 mL/min    Comment: (NOTE) Calculated using the CKD-EPI Creatinine Equation (2021)    Anion gap 11 5 - 15    Comment: Performed at [facility], [place] [address], [place], [place] [address]  Glucose, capillary     Status: Abnormal   Collection Time: 09/22/20  7:59 AM  Result Value Ref Range   Glucose-Capillary 115 (H) 70 - 99 mg/dL    Comment: Glucose reference range applies only to samples taken after fasting for at least 8 hours.  Glucose, capillary     Status: None   Collection Time: 09/22/20 12:05 PM  Result Value Ref Range   Glucose-Capillary 92 70 - 99 mg/dL    Comment: Glucose reference range applies only to samples taken after fasting for at least 8 hours.   Comment 1 Notify RN    Comment 2 Document in Chart     MICRO:    Organism ID, Bacteria KLEBSIELLA PNEUMONIAEAbnormal   Resulting Agency [facility]  Susceptibility   Klebsiella pneumoniae    MIC    AMPICILLIN RESISTANT  Resistant    AMPICILLIN/SULBACTAM 4 SENSITIVE  Sensitive    CEFAZOLIN <=4 SENSITIVE  Sensitive    CEFEPIME <=0.12 SENS... Sensitive    CEFTRIAXONE <=0.25 SENS... Sensitive    CIPROFLOXACIN 0.5 SENSITIVE  Sensitive    GENTAMICIN <=1 SENSITIVE  Sensitive    IMIPENEM <=0.25 SENS... Sensitive    NITROFURANTOIN 64 INTERMED... Intermediate    PIP/TAZO <=4 SENSITIVE  Sensitive    TRIMETH/SULFA >=320 RESIS... Resistant       IMAGING: DG CHEST PORT 1 VIEW  Result Date: 09/22/2020 CLINICAL DATA:  Concern for aspiration EXAM: PORTABLE CHEST 1 VIEW COMPARISON:   09/21/2020 FINDINGS: Extensive airspace disease throughout the right lung, most confluent in the perihilar regions. Left lower lobe airspace opacity. Layering right pleural effusion. Heart is normal size. No pneumothorax. Endotracheal tube is 5 cm above the carina. IMPRESSION: Dense right perihilar consolidation and left lower lobe consolidation concerning for pneumonia. Layering right pleural effusion. Electronically Signed   By: Rolm Baptise M.D.   On: 09/22/2020 00:18   DG CHEST PORT 1 VIEW  Result Date: 09/21/2020 CLINICAL DATA:  Post intubation.  EXAM: PORTABLE CHEST 1 VIEW COMPARISON:  Chest x-ray from same day at 0447 hours. FINDINGS: Endotracheal tube tip 3.9 cm above the carina. Stable cardiomediastinal silhouette. Normal pulmonary vascularity. Improved aeration in the left lung with residual mild atelectasis. Worsened aeration in the right mid and lower lung. No acute osseous abnormality. Prior thoracolumbar fusion. IMPRESSION: 1. Endotracheal tube tip 3.9 cm above the carina. 2. Improved aeration in the left lung. Worsened aeration in the right lung. Electronically Signed   By: Titus Dubin M.D.   On: 09/21/2020 13:16   DG Chest Port 1 View  Result Date: 09/21/2020 CLINICAL DATA:  Respiratory failure. EXAM: PORTABLE CHEST 1 VIEW COMPARISON:  09/20/2020. FINDINGS: Endotracheal tube tip noted 4 cm above the carina. Heart size stable. Persistent left mid and left lower lung infiltrate without significant interim change. Bibasilar atelectasis. Small left pleural effusion no pneumothorax. Prior thoracic spine fusion. IMPRESSION: 1. Endotracheal tube tip noted 4 cm above the carina. 2. Persistent left mid and left lower lung infiltrate without significant interim change. Bibasilar atelectasis. Small left pleural effusion again noted. Electronically Signed   By: Marcello Moores  Register   On: 09/21/2020 05:49    Assessment/Plan: 44yo M with quadraplegia, deconditioned from prolonged hospitalization,  readmitted with sepsis thought to be due to urinary source with secondary bacteremia plus hospital associated pneumonia  - continue on aztreonam for klebsiella bacteremia and uti  Hospital onset pneumonia: - low threshold to add vancomycin if has fever, or increase vent needs or MRSA screen being positive. Awaiting respiratory culture identification  - would not do linezolid since already has thrombocytopenia - please repeat CBC with diff tomorrow.

## 2020-09-22 NOTE — Progress Notes (Addendum)
Nutrition Follow-up  INTERVENTION:   -Change to Vital AF 1.2 @ 60 ml/hr via NGT -d/c Prosource & Osmolite 1.2 -This will provide 1728 kcals, 108g protein and 1167 ml H2O.  -Free water flush per MD, currently 200 ml every 6 hours (800 ml)  NUTRITION DIAGNOSIS:   Inadequate oral intake related to inability to eat as evidenced by NPO status.  Ongoing.  GOAL:   Patient will meet greater than or equal to 90% of their needs  Meeting.  MONITOR:   Vent status, TF tolerance, Labs, Weight trends, Skin  REASON FOR ASSESSMENT:   Consult Enteral/tube feeding initiation and management  ASSESSMENT:   44 y.o. male with medical history of impaired hearing, neurofibromatosis 2, neurogenic bladder, pressure ulcer, quadriplegia, seizures, vestibular schwannoma, and cerebral thrombosis with cerebral infarction. He was intubated 9/21-10/22 at [facility]. He presented to the ED due to worsening mental status since 10/29. EMS reported to ED staff that patient was unresponsive to sternal rub initially.  RD consulted to evaluate if pt needs a different TF formula based on pt's increase in loose stools.   Based on review of medications, pt has been receiving high osmolality medications such as tylenol, Lasix and ferrous sulfate. Pt has also been on IV antibiotics, currently still receiving IV Azactam. These can all contribute to loose stools.  Will switch formula to Vital AF 1.2 (which contains some soluble fiber and is peptide based) in the meantime to see if this will improve symptoms. If stools do not improve, may need to check for c.diff.  Suspect pt will be able to transition back to bolus feeds of Osmolite 1.5 prior to discharge and once out of ICU.  Admission weight: 159 lbs.  I/Os: 5.9L since admit UOP: 1975 ml x 24 hrs  Patient is currently intubated on ventilator support MV: 7.1 L/min Temp (24hrs), Avg:97.3 F (36.3 C), Min:95.8 F (35.4 C), Max:99.7 F (37.6 C)  Medications:  Vitamin C, Ferrous sulfate, IV Lasix  Labs reviewed: CBGs: 92-115  Diet Order:   Diet Order            Diet NPO time specified  Diet effective now                 EDUCATION NEEDS:   No education needs have been identified at this time  Skin:  Skin Assessment: Skin Integrity Issues: Skin Integrity Issues:: Stage I, Stage III Stage I: R elbow; R heel Stage III: sacrum  Last BM:  11/5 -type 7  Height:   Ht Readings from Last 1 Encounters:  09/19/20 5\' 10"  (1.778 m)    Weight:   Wt Readings from Last 1 Encounters:  09/20/20 72.4 kg   BMI:  Body mass index is 22.9 kg/m.  Estimated Nutritional Needs:   Kcal:  1840  Protein:  105-120 grams  Fluid:  >/= 2 L/day  Clayton Bibles, MS, RD, LDN Inpatient Clinical Dietitian Contact information available via Amion

## 2020-09-22 NOTE — Progress Notes (Addendum)
NAME:  Cameron Hale, [name]:  [id number], DOB:  06/03/1976, LOS: 4 ADMISSION DATE:  10/15/2020, CONSULTATION DATE:  09/19/2020 REFERRING MD:  Dr. Dyann Kief, CHIEF COMPLAINT:  Sepsis   Brief History   44 yo Male hx of quadriplegia and recent intubation, admitted to [facility] on 11/1 for AMS and ARF with hypoxia requiring intubation.    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 G-tube (09/05/2020)   Significant Hospital Events   Admitted to [facility] 11/1>>11/2 Transferred to [facility] 11/2>>, on pressors. 11/3: Pressors off.  Growing K pneumoniae in blood and urine, antibiotics narrowed. EEG obtained. Was neg for seizure Triggered apnea alarm on vent. Fent gtt stopped. Not able to wean  11/4 more awake. On SBT volumes look good. Stress dose steroids back to home decadron.  Passed SBT but got progressively hypercarbic and required re-intubation 11/4 11/5 repeat sputum. Hemodynamics stable. Not ready to extubate given mental status. Getting lasix   Consults:  PCCM  Procedures:  Intubated 11/2 >>11/4 (reintubated 11/4)>>>  Significant Diagnostic Tests:  11/1 CT >> extensive meningiomatosis noted unchanged. Bifrontal edema is stable. No acute intracranial abnormality.  11/3 EEG: no seizure   Micro Data:  11/1 Flu >> negative 11/1 Covid >> negative 11/1 Blood culture >> K pneumoniae 11/1 Urine culture >> K pneumoniae 11/4 repeat BCX2>>> 11/5 repeat sputum sent>>> Antimicrobials:  11/1 Cefepime >> 11/2 Azactam 11/2 11/2 Metronidazole >> 11/3 11/2 Vancomycin >> 11/3  Interim history/subjective:  No change over night   Objective   Blood pressure (Abnormal) 146/86, pulse 76, temperature (Abnormal) 95.8 F (35.4 C), temperature source Axillary, resp. rate 15, height 5\' 10"  (1.778 m), weight 72.4 kg, SpO2 98 %.    Vent Mode: PRVC FiO2 (%):  [30 %-40 %] 30 % Set  Rate:  [12 bmp] 12 bmp Vt Set:  [580 mL] 580 mL PEEP:  [5 cmH20] 5 cmH20 Plateau Pressure:  [14 cmH20-18 cmH20] 16 cmH20   Intake/Output Summary (Last 24 hours) at 09/22/2020 1054 Last data filed at 09/22/2020 0845 Gross per 24 hour  Intake 3293.2 ml  Output 1975 ml  Net 1318.2 ml   Filed Weights   10/12/2020 1822 09/20/20 0444  Weight: 72.4 kg 72.4 kg    Examination:  General this is a 44 year old male who is back on vent. Minimally responsive.  HENT neck short. Old surgical incision healed. Pupils equal and reactive  Pulm dec bases. No accessory use. Now on full vent support. Card distant rrr  abd soft not tender freq liquid stools gu cl yellow  Neuro minimally responsive. Still has tremor    Resolved Hospital Problem list    Hypothermia Hyponatremia Hypoglycemia  Septic shock  Assessment & Plan:  Sepsis w/ Urinary tract source w/ associated K Pneumoniae bacteremia  Plan azactam day 5, repeat blood cultures pending from 11/4 Cont midodrine   Acute respiratory failure  Left lung collapse/possible mucus plug due to neuromuscular weakness, made worse in setting of sepsis and PNA  pcxr personally reviewed. RUL consolidation persists. Aeration bilaterally better but looks like has sig right effusion.  -Mental status will not support extubation. Muscular endurance and cough mechanics primary reason for re-intubation on 11/4 Plan Cont full vent support  Sending respiratory MRSA PCR  As repeat sputum culture from 11/5 w/ GPC, start vanc if + MRSA otherwise just cont azactam as we await cultures from 11/5 Cont VAP bundle PAD protocol w/ RASS  goal 0 Will evaluate right chest re: effusion w/ [place]. May need thora Family meeting today to decide on one way extubation vs trach   Acute metabolic encephalopathy superimposed on Neurofibroma of multiple sites ([place]) Quadriplegia ([employer]) -working dx is that this is UTI/sepsis driving acute Mental status change. More awake today off  sedating gtts  Plan Supportive care Limit sedating meds Back on his regular decadron dosing   Seizures (HCC) Has a tremor; EEG obtained 11/3 and there was diffuse encephalopathy but no active seizure Plan Keppra  Fluid and electrolyte imbalance: hypokalemia, hyperchloremia, w/ associated hyperchloremia-->improved  Plan Replace K again as giving lasix  Am chemistry Cont current free water   Symptomatic anemia w/ Positive fecal occult blood Thombrocytopenia -positive fecal occult blood X 2  Got 2 units of blood given 11/1 for Hb 6.2  -hgb and platelets as of 11/5 are stable and have not seen evidence of bleeding Plan Holding ac ppi bid Would only involve GI if there was active bleed at this point resulting in hemodynamic compromise   Severe protein-calorie malnutrition ([place]) Plan TF via PEG  Liquids stools Plan Holding laxative and stool softener  Ask RD to re-eval-->perhaps different tubefeed?  Add pro-biotic  flexi-seal for now   Hyperglycemia Glucose better Plan Cont ssi    Best practice:  Diet: NPO-->tubefeeds Pain/Anxiety/Delirium protocol (if indicated): Versed, fentanyl-->stopped 11/3 VAP protocol (if indicated): yes-->stop 11/3 DVT prophylaxis: SCDs (Hb low and positive iFOB) GI prophylaxis: protonix Glucose control: ssi Mobility: bed rest Code Status: Full Family Communication:mother  updated 11/5  Disposition:  Continue ICU care   Critical care time: 34 min      Erick Colace ACNP-BC [place] Pager # [phone] OR # [phone] if no answer

## 2020-09-23 ENCOUNTER — Inpatient Hospital Stay (HOSPITAL_COMMUNITY): Payer: Medicare Other

## 2020-09-23 DIAGNOSIS — Q8500 Neurofibromatosis, unspecified: Secondary | ICD-10-CM | POA: Diagnosis not present

## 2020-09-23 DIAGNOSIS — J9602 Acute respiratory failure with hypercapnia: Secondary | ICD-10-CM | POA: Diagnosis not present

## 2020-09-23 DIAGNOSIS — A419 Sepsis, unspecified organism: Secondary | ICD-10-CM | POA: Diagnosis not present

## 2020-09-23 LAB — CBC WITH DIFFERENTIAL/PLATELET
Abs Immature Granulocytes: 0.61 10*3/uL — ABNORMAL HIGH (ref 0.00–0.07)
Basophils Absolute: 0.1 10*3/uL (ref 0.0–0.1)
Basophils Relative: 1 %
Eosinophils Absolute: 0 10*3/uL (ref 0.0–0.5)
Eosinophils Relative: 0 %
HCT: 28.2 % — ABNORMAL LOW (ref 39.0–52.0)
Hemoglobin: 8.9 g/dL — ABNORMAL LOW (ref 13.0–17.0)
Immature Granulocytes: 6 %
Lymphocytes Relative: 7 %
Lymphs Abs: 0.8 10*3/uL (ref 0.7–4.0)
MCH: 28.3 pg (ref 26.0–34.0)
MCHC: 31.6 g/dL (ref 30.0–36.0)
MCV: 89.5 fL (ref 80.0–100.0)
Monocytes Absolute: 0.5 10*3/uL (ref 0.1–1.0)
Monocytes Relative: 5 %
Neutro Abs: 8.8 10*3/uL — ABNORMAL HIGH (ref 1.7–7.7)
Neutrophils Relative %: 81 %
Platelets: 92 10*3/uL — ABNORMAL LOW (ref 150–400)
RBC: 3.15 MIL/uL — ABNORMAL LOW (ref 4.22–5.81)
RDW: 19.4 % — ABNORMAL HIGH (ref 11.5–15.5)
WBC: 10.8 10*3/uL — ABNORMAL HIGH (ref 4.0–10.5)
nRBC: 0.2 % (ref 0.0–0.2)

## 2020-09-23 LAB — COMPREHENSIVE METABOLIC PANEL
ALT: 66 U/L — ABNORMAL HIGH (ref 0–44)
AST: 30 U/L (ref 15–41)
Albumin: 1.8 g/dL — ABNORMAL LOW (ref 3.5–5.0)
Alkaline Phosphatase: 85 U/L (ref 38–126)
Anion gap: 10 (ref 5–15)
BUN: 37 mg/dL — ABNORMAL HIGH (ref 6–20)
CO2: 22 mmol/L (ref 22–32)
Calcium: 8.1 mg/dL — ABNORMAL LOW (ref 8.9–10.3)
Chloride: 109 mmol/L (ref 98–111)
Creatinine, Ser: 0.37 mg/dL — ABNORMAL LOW (ref 0.61–1.24)
GFR, Estimated: >60 mL/min (ref >60)
Glucose, Bld: 98 mg/dL (ref 70–99)
Potassium: 4.5 mmol/L (ref 3.5–5.1)
Sodium: 141 mmol/L (ref 135–145)
Total Bilirubin: 0.5 mg/dL (ref 0.3–1.2)
Total Protein: 4.8 g/dL — ABNORMAL LOW (ref 6.5–8.1)

## 2020-09-23 LAB — GLUCOSE, CAPILLARY
Glucose-Capillary: 102 mg/dL — ABNORMAL HIGH (ref 70–99)
Glucose-Capillary: 104 mg/dL — ABNORMAL HIGH (ref 70–99)
Glucose-Capillary: 110 mg/dL — ABNORMAL HIGH (ref 70–99)
Glucose-Capillary: 79 mg/dL (ref 70–99)
Glucose-Capillary: 86 mg/dL (ref 70–99)
Glucose-Capillary: 94 mg/dL (ref 70–99)

## 2020-09-23 NOTE — Progress Notes (Signed)
NAME:  Cameron Hale, [name]:  [id number], DOB:  [birth year], LOS: 5 ADMISSION DATE:  09/22/2020, CONSULTATION DATE:  11/2 REFERRING MD:  Dyann Kief, CHIEF COMPLAINT:  Confusion   Brief History   44 y/o male with quadriplegia and recent intubation admitted from [facility] in the setting of respiratory failure likely from respiratory muscle weakness exacerbated by sepsis of a urinary origin.  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 G-tube (09/05/2020)  Significant Hospital Events   Admitted to [facility] 11/1>>11/2 Transferred to [facility] 11/2>>, on pressors. 11/3: Pressors off.  Growing K pneumoniae in blood and urine, antibiotics narrowed. EEG obtained. Was neg for seizure Triggered apnea alarm on vent. Fent gtt stopped. Not able to wean  11/4 more awake. On SBT volumes look good. Extubated, then re-intubated 11/5 family conversation: doesn't want tracheostomy, want to give him more time to try to get stronger  Consults:  PCCM  Procedures:  11/2 ETT > 11/4, 11/4 >   Significant Diagnostic Tests:  11/1 CT >> extensive meningiomatosis noted unchanged. Bifrontal edema is stable. No acute intracranial abnormality.  11/3 EEG: no seizure  Micro Data:  11/1 Flu >> negative 11/1 Covid >> negative 11/1 Blood culture >> K pneumoniae 11/1 Urine culture >> K pneumoniae 11/5 resp culture> GPC  Antimicrobials:  11/1 Cefepime >> 11/2 Azactam 11/2 >> 11/2 Metronidazole >> 11/3 11/2 Vancomycin >> 11/3  Interim history/subjective:   No acute events Remains mechanically ventilated  Objective   Blood pressure (!) 106/59, pulse 86, temperature 98.5 F (36.9 C), temperature source Axillary, resp. rate 13, height 5\' 10"  (1.778 m), weight 78.7 kg, SpO2 97 %.    Vent Mode: PRVC FiO2 (%):  [30 %] 30 % Set Rate:  [12 bmp] 12 bmp Vt Set:  [580 mL] 580 mL PEEP:  [5 cmH20] 5  cmH20 Pressure Support:  [5 cmH20-10 cmH20] 10 cmH20 Plateau Pressure:  [12 cmH20-16 cmH20] 12 cmH20   Intake/Output Summary (Last 24 hours) at 09/23/2020 0805 Last data filed at 09/23/2020 5188 Gross per 24 hour  Intake 1149.12 ml  Output 4950 ml  Net -3800.88 ml   Filed Weights   09/30/2020 1822 09/20/20 0444 09/23/20 0437  Weight: 72.4 kg 72.4 kg 78.7 kg    Examination:  General:  In bed on vent HENT: NCAT ETT in place PULM: CTA B, vent supported breathing CV: RRR, no mgr GI: BS+, soft, nontender MSK: normal bulk and tone Neuro: eyes open, no motor movements of arms/legs when I'm there, doesn't look at me or follow commands    Resolved Hospital Problem list     Assessment & Plan:  Sepsis/septic shock; Urinary tract source w/ associated K Pneumoniae bacteremia : shock resolved Possible cefepime allergy Plan Continue aztreonam > 10 days unless ID specifies otherwise Monitor for fever  Acute respiratory failurewith hypoxemia> limited by ability to clear secretions and respiratory muscle weakness Left lung collapse/possible mucus plug due to neuromuscular weakness, made worse in setting of sepsis R lung infiltrate> likely HCAP vs aspiration; GPC in sputum, no evidence of worsening based on CXR and clinical exam Pressure support trials daily as long as possible to help wean off vent Needs tracheostomy if family desires full scope of care: continue to address goals of care with them Full mechanical vent support VAP prevention Daily WUA/SBT Hold lasix  Acute metabolic encephalopathy superimposed on Neurofibroma of multiple sites ([place]) Quadriplegia ([place]) Need for sedation for mechanical ventilation  Supportive care Minimize sedating medications PAD protocol, prn fentanyl RASS goal 0  Seizures (HCC) Has a tremor; EEG obtained 11/3 and there was diffuse encephalopathy but no active seizure Keppra   Symptomatic anemia w/ Positive fecal occult blood last  hospitalization Thrombocytopenia > improving -positive fecal occult blood X 2 Got 2 units of blood given 11/1 for Hb 6.2 has now been stable for about 48 hrs w/ no evidence of acute bleeding and only small hgb drift BID PPI Fe supplement GI consult if bleeding worsens   Severe protein-calorie malnutrition (HCC) Has PEG tube Tube feedings to continue  Liquids stools Hold bowel regimen   Hyperglycemia > well controlled 11/6 Monitor off steroids SSI Goal glucose 140-180   Best practice:  Diet: tube feeding Pain/Anxiety/Delirium protocol (if indicated): as above VAP protocol (if indicated): yes DVT prophylaxis: SCD's with thrombocytopenia GI prophylaxis: Pantoprazole for stress ulcer prophylaxis Glucose control: monitor Mobility: bed rest Code Status: full Family Communication: none bedside, called his Disposition:   Labs   CBC: Recent Labs  Lab 09/19/20 0332 09/20/20 0308 09/21/20 0248 09/22/20 0248 09/23/20 0243  WBC 4.5 9.7 7.6 11.4* 10.8*  NEUTROABS 2.5 8.1* 6.5 9.5* 8.8*  HGB 9.4* 9.4* 8.6* 9.1* 8.9*  HCT 30.6* 29.2* 26.1* 28.6* 28.2*  MCV 90.8 86.1 86.7 89.9 89.5  PLT 88* 77* 67* 79* 92*    Basic Metabolic Panel: Recent Labs  Lab 10/12/2020 1802 10/10/2020 1802 09/19/20 0332 09/19/20 1339 09/19/20 1708 09/20/20 0308 09/20/20 1711 09/21/20 0248 09/22/20 0751 09/23/20 0243  NA 131*   < > 140  --   --  140  --  141 140 141  K 4.3   < > 5.0  --   --  3.8  --  2.8* 4.1 4.5  CL 101   < > 104  --   --  113*  --  112* 109 109  CO2 27   < > 22  --   --  20*  --  21* 20* 22  GLUCOSE 108*   < > 55*  --   --  120*  --  224* 139* 98  BUN 37*   < > 30*  --   --  29*  --  33* 34* 37*  CREATININE <0.30*   < > <0.30*  --   --  0.32*  --  0.36* 0.34* 0.37*  CALCIUM 7.2*   < > 7.7*  --   --  7.6*  --  7.8* 7.7* 8.1*  MG 2.0  --   --  1.8 1.9 2.0 1.9  --   --   --   PHOS 2.4*  --   --  3.1 3.2 3.4 3.2  --   --   --    < > = values in this interval not displayed.     GFR: Estimated Creatinine Clearance: 120.4 mL/min (A) (by C-G formula based on SCr of 0.37 mg/dL (L)). Recent Labs  Lab 10/14/2020 1802 09/21/2020 1952 09/19/20 0332 09/20/20 0308 09/21/20 0248 09/22/20 0248 09/23/20 0243  WBC 5.2  --    < > 9.7 7.6 11.4* 10.8*  LATICACIDVEN 0.9 1.2  --   --   --   --   --    < > = values in this interval not displayed.    Liver Function Tests: Recent Labs  Lab 10/02/2020 1802 09/19/20 0332 09/20/20 0308 09/21/20 0248 09/23/20 0243  AST 40 42* 43* 29 30  ALT 94* 95* 76*  68* 66*  ALKPHOS 85 78 76 77 85  BILITOT 0.2* 0.6 0.4 0.2* 0.5  PROT 3.9* 4.2* 4.1* 4.3* 4.8*  ALBUMIN 1.7* 1.8* 1.7* 1.6* 1.8*   No results for input(s): LIPASE, AMYLASE in the last 168 hours. No results for input(s): AMMONIA in the last 168 hours.  ABG    Component Value Date/Time   PHART 7.252 (L) 09/21/2020 1355   PCO2ART 49.9 (H) 09/21/2020 1355   PO2ART 96.6 09/21/2020 1355   HCO3 21.2 09/21/2020 1355   TCO2 28 08/22/2020 1854   ACIDBASEDEF 5.4 (H) 09/21/2020 1355   O2SAT 97.5 09/21/2020 1355     Coagulation Profile: Recent Labs  Lab 09/22/2020 1802  INR 1.0    Cardiac Enzymes: No results for input(s): CKTOTAL, CKMB, CKMBINDEX, TROPONINI in the last 168 hours.  HbA1C: Hgb A1c MFr Bld  Date/Time Value Ref Range Status  08/14/2020 04:55 AM 6.1 (H) 4.8 - 5.6 % Final    Comment:    (NOTE) Pre diabetes:          5.7%-6.4%  Diabetes:              >6.4%  Glycemic control for   <7.0% adults with diabetes     CBG: Recent Labs  Lab 09/22/20 1205 09/22/20 1556 09/22/20 1929 09/22/20 2320 09/23/20 0435  GLUCAP 92 108* 108* 122* 86     Critical care time: 35 minutes    Roselie Awkward, MD [place] P[facility]M Pager: [phone] Cell: [phone] If no response, call [phone]

## 2020-09-24 DIAGNOSIS — J9602 Acute respiratory failure with hypercapnia: Secondary | ICD-10-CM | POA: Diagnosis not present

## 2020-09-24 DIAGNOSIS — A419 Sepsis, unspecified organism: Secondary | ICD-10-CM | POA: Diagnosis not present

## 2020-09-24 DIAGNOSIS — K922 Gastrointestinal hemorrhage, unspecified: Secondary | ICD-10-CM | POA: Diagnosis not present

## 2020-09-24 LAB — CBC WITH DIFFERENTIAL/PLATELET
Abs Immature Granulocytes: 1.18 10*3/uL — ABNORMAL HIGH (ref 0.00–0.07)
Basophils Absolute: 0.1 10*3/uL (ref 0.0–0.1)
Basophils Relative: 0 %
Eosinophils Absolute: 0 10*3/uL (ref 0.0–0.5)
Eosinophils Relative: 0 %
HCT: 26.4 % — ABNORMAL LOW (ref 39.0–52.0)
Hemoglobin: 8.4 g/dL — ABNORMAL LOW (ref 13.0–17.0)
Immature Granulocytes: 7 %
Lymphocytes Relative: 5 %
Lymphs Abs: 0.9 10*3/uL (ref 0.7–4.0)
MCH: 28.4 pg (ref 26.0–34.0)
MCHC: 31.8 g/dL (ref 30.0–36.0)
MCV: 89.2 fL (ref 80.0–100.0)
Monocytes Absolute: 0.9 10*3/uL (ref 0.1–1.0)
Monocytes Relative: 5 %
Neutro Abs: 14.1 10*3/uL — ABNORMAL HIGH (ref 1.7–7.7)
Neutrophils Relative %: 83 %
Platelets: 124 10*3/uL — ABNORMAL LOW (ref 150–400)
RBC: 2.96 MIL/uL — ABNORMAL LOW (ref 4.22–5.81)
RDW: 19.1 % — ABNORMAL HIGH (ref 11.5–15.5)
WBC: 17.1 10*3/uL — ABNORMAL HIGH (ref 4.0–10.5)
nRBC: 0 % (ref 0.0–0.2)

## 2020-09-24 LAB — GLUCOSE, CAPILLARY
Glucose-Capillary: 101 mg/dL — ABNORMAL HIGH (ref 70–99)
Glucose-Capillary: 77 mg/dL (ref 70–99)
Glucose-Capillary: 132 mg/dL — ABNORMAL HIGH (ref 70–99)
Glucose-Capillary: 130 mg/dL — ABNORMAL HIGH (ref 70–99)
Glucose-Capillary: 133 mg/dL — ABNORMAL HIGH (ref 70–99)

## 2020-09-24 LAB — COMPREHENSIVE METABOLIC PANEL
ALT: 62 U/L — ABNORMAL HIGH (ref 0–44)
AST: 33 U/L (ref 15–41)
Albumin: 1.8 g/dL — ABNORMAL LOW (ref 3.5–5.0)
Alkaline Phosphatase: 105 U/L (ref 38–126)
Anion gap: 10 (ref 5–15)
BUN: 34 mg/dL — ABNORMAL HIGH (ref 6–20)
CO2: 24 mmol/L (ref 22–32)
Calcium: 8.1 mg/dL — ABNORMAL LOW (ref 8.9–10.3)
Chloride: 108 mmol/L (ref 98–111)
Creatinine, Ser: <0.3 mg/dL — ABNORMAL LOW (ref 0.61–1.24)
Glucose, Bld: 104 mg/dL — ABNORMAL HIGH (ref 70–99)
Potassium: 3.9 mmol/L (ref 3.5–5.1)
Sodium: 142 mmol/L (ref 135–145)
Total Bilirubin: 0.5 mg/dL (ref 0.3–1.2)
Total Protein: 4.8 g/dL — ABNORMAL LOW (ref 6.5–8.1)

## 2020-09-24 LAB — CULTURE, RESPIRATORY W GRAM STAIN
Culture: NORMAL
Special Requests: NORMAL

## 2020-09-24 MED ORDER — POLYVINYL ALCOHOL 1.4 % OP SOLN
1.0000 [drp] | OPHTHALMIC | Status: DC | PRN
  Administered 2020-10-02 – 2020-10-10 (×2): 1 [drp] via OPHTHALMIC
  Filled 2020-09-24: qty 15

## 2020-09-24 MED ORDER — FUROSEMIDE 10 MG/ML IJ SOLN
40.0000 mg | Freq: Four times a day (QID) | INTRAMUSCULAR | Status: AC
  Administered 2020-09-24 (×2): 40 mg via INTRAVENOUS
  Filled 2020-09-24 (×3): qty 4

## 2020-09-24 NOTE — Progress Notes (Signed)
NAME:  Cameron Hale, [name]:  [id number], DOB:  [birth year], LOS: 6 ADMISSION DATE:  10/15/2020, CONSULTATION DATE:  11/2 REFERRING MD:  Dyann Kief, CHIEF COMPLAINT:  Confusion   Brief History   44 y/o male with quadriplegia and recent intubation admitted from [facility] in the setting of respiratory failure likely from respiratory muscle weakness exacerbated by sepsis of a urinary origin.  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 G-tube (09/05/2020)  Significant Hospital Events   Admitted to [facility] 11/1>>11/2 Transferred to [facility] 11/2>>, on pressors. 11/3: Pressors off.  Growing K pneumoniae in blood and urine, antibiotics narrowed. EEG obtained. Was neg for seizure Triggered apnea alarm on vent. Fent gtt stopped. Not able to wean  11/4 more awake. On SBT volumes look good. Extubated, then re-intubated 11/5 family conversation: doesn't want tracheostomy, want to give him more time to try to get stronger  Consults:  PCCM  Procedures:  11/2 ETT > 11/4, 11/4 >   Significant Diagnostic Tests:  11/1 CT >> extensive meningiomatosis noted unchanged. Bifrontal edema is stable. No acute intracranial abnormality.  11/3 EEG: no seizure  Micro Data:  11/1 Flu >> negative 11/1 Covid >> negative 11/1 Blood culture >> K pneumoniae 11/1 Urine culture >> K pneumoniae 11/5 resp culture> normal flora  Antimicrobials:  11/1 Cefepime >> 11/2 Azactam 11/2 >> 11/2 Metronidazole >> 11/3 11/2 Vancomycin >> 11/3  Interim history/subjective:    Nodded head to question this morning Has been watching TV with his wife this morning WBC up Wife says that mouth movement and tongue fasiculation is similar to his last hospitalization  Objective   Blood pressure (!) 102/57, pulse 90, temperature 98.9 F (37.2 C), temperature source Axillary, resp. rate 15, height 5\' 10"  (1.778 m),  weight 78.7 kg, SpO2 98 %.    Vent Mode: PRVC FiO2 (%):  [30 %] 30 % Set Rate:  [12 bmp] 12 bmp Vt Set:  [580 mL] 580 mL PEEP:  [5 cmH20] 5 cmH20 Plateau Pressure:  [12 cmH20-16 cmH20] 13 cmH20   Intake/Output Summary (Last 24 hours) at 09/24/2020 0800 Last data filed at 09/24/2020 9563 Gross per 24 hour  Intake 3528.22 ml  Output 1050 ml  Net 2478.22 ml   Filed Weights   09/30/2020 1822 09/20/20 0444 09/23/20 0437  Weight: 72.4 kg 72.4 kg 78.7 kg    Examination:  General:  In bed on vent, chronically ill appearing HENT: surgical scar well healed ETT in place PULM: CTA B, vent supported breathing CV: RRR, no mgr GI: BS+, soft, nontender MSK: normal bulk and tone Neuro: eyes open, tongue fasciculation     Resolved Hospital Problem list   Left lung collapse/possible mucus plug due to neuromuscular weakness, made worse in setting of sepsis > resolved  Assessment & Plan:  Sepsis/septic shock; Urinary tract source w/ associated K Pneumoniae bacteremia : shock resolved Possible cefepime allergy Plan Aztreonam 10 days unless ID specifies otherwise  Acute respiratory failurewith hypoxemia> limited by ability to clear secretions and respiratory muscle weakness R lung infiltrate> likely HCAP vs aspiration; GPC in sputum, no evidence of worsening based on CXR and clinical exam Pressure support trials daily as long as possible to help wean off vent Needs tracheostomy if family desires full scope of care: continue to address goals of care with them After pressure support during daytime, resume full mechanical vent support at night VAP prevention Daily WUA/SBT Lasix today  Acute  metabolic encephalopathy superimposed on Neurofibroma of multiple sites ([place]) Quadriplegia ([place]) Need for sedation for mechanical ventilation Mouth tongue fasciculations, unclear cause but similar to when he was hospitalized/ventilated at [facility] previously Minimize sedating medicines RASS target 0 PAD  protocol, prn fentanyl  Seizures ([place]) Has a tremor; EEG obtained 11/3 and there was diffuse encephalopathy but no active seizure Keppra  Symptomatic anemia w/ Positive fecal occult blood last hospitalization Thrombocytopenia > improving -positive fecal occult blood X 2 Got 2 units of blood given 11/1 for Hb 6.2 has now been stable for about 48 hrs w/ no evidence of acute bleeding and only small hgb drift BID PPI Fe supplement GI consult if worse  Severe protein-calorie malnutrition ([place]) Has PEG tube Tube feedings to continue  Liquids stools At risk for c diff Monitor Check c diff if febrile  Hyperglycemia > well controlled 11/6 Monitor off steroids SSI Goal glucose 140-180    Best practice:  Diet: tube feeding Pain/Anxiety/Delirium protocol (if indicated): as above VAP protocol (if indicated): yes DVT prophylaxis: SCD's with thrombocytopenia GI prophylaxis: Pantoprazole for stress ulcer prophylaxis Glucose control: monitor Mobility: bed rest Code Status: full Family Communication: spoke to his wife at bedside at length, asked her to return with family for a family meeting on 11/9  Disposition:   Labs   CBC: Recent Labs  Lab 09/20/20 0308 09/21/20 0248 09/22/20 0248 09/23/20 0243 09/24/20 0240  WBC 9.7 7.6 11.4* 10.8* 17.1*  NEUTROABS 8.1* 6.5 9.5* 8.8* 14.1*  HGB 9.4* 8.6* 9.1* 8.9* 8.4*  HCT 29.2* 26.1* 28.6* 28.2* 26.4*  MCV 86.1 86.7 89.9 89.5 89.2  PLT 77* 67* 79* 92* 124*    Basic Metabolic Panel: Recent Labs  Lab 10/15/2020 1802 09/19/20 0332 09/19/20 1339 09/19/20 1708 09/20/20 0308 09/20/20 1711 09/21/20 0248 09/22/20 0751 09/23/20 0243 09/24/20 0240  NA 131*   < >  --   --  140  --  141 140 141 142  K 4.3   < >  --   --  3.8  --  2.8* 4.1 4.5 3.9  CL 101   < >  --   --  113*  --  112* 109 109 108  CO2 27   < >  --   --  20*  --  21* 20* 22 24  GLUCOSE 108*   < >  --   --  120*  --  224* 139* 98 104*  BUN 37*   < >  --   --   29*  --  33* 34* 37* 34*  CREATININE <0.30*   < >  --   --  0.32*  --  0.36* 0.34* 0.37* <0.30*  CALCIUM 7.2*   < >  --   --  7.6*  --  7.8* 7.7* 8.1* 8.1*  MG 2.0  --  1.8 1.9 2.0 1.9  --   --   --   --   PHOS 2.4*  --  3.1 3.2 3.4 3.2  --   --   --   --    < > = values in this interval not displayed.   GFR: CrCl cannot be calculated (This lab value cannot be used to calculate CrCl because it is not a number: <0.30). Recent Labs  Lab 10/01/2020 1802 10/14/2020 1952 09/19/20 0332 09/21/20 0248 09/22/20 0248 09/23/20 0243 09/24/20 0240  WBC 5.2  --    < > 7.6 11.4* 10.8* 17.1*  LATICACIDVEN 0.9 1.2  --   --   --   --   --    < > =  values in this interval not displayed.    Liver Function Tests: Recent Labs  Lab 09/19/20 0332 09/20/20 0308 09/21/20 0248 09/23/20 0243 09/24/20 0240  AST 42* 43* 29 30 33  ALT 95* 76* 68* 66* 62*  ALKPHOS 78 76 77 85 105  BILITOT 0.6 0.4 0.2* 0.5 0.5  PROT 4.2* 4.1* 4.3* 4.8* 4.8*  ALBUMIN 1.8* 1.7* 1.6* 1.8* 1.8*   No results for input(s): LIPASE, AMYLASE in the last 168 hours. No results for input(s): AMMONIA in the last 168 hours.  ABG    Component Value Date/Time   PHART 7.252 (L) 09/21/2020 1355   PCO2ART 49.9 (H) 09/21/2020 1355   PO2ART 96.6 09/21/2020 1355   HCO3 21.2 09/21/2020 1355   TCO2 28 08/22/2020 1854   ACIDBASEDEF 5.4 (H) 09/21/2020 1355   O2SAT 97.5 09/21/2020 1355     Coagulation Profile: Recent Labs  Lab 10/06/2020 1802  INR 1.0    Cardiac Enzymes: No results for input(s): CKTOTAL, CKMB, CKMBINDEX, TROPONINI in the last 168 hours.  HbA1C: Hgb A1c MFr Bld  Date/Time Value Ref Range Status  08/14/2020 04:55 AM 6.1 (H) 4.8 - 5.6 % Final    Comment:    (NOTE) Pre diabetes:          5.7%-6.4%  Diabetes:              >6.4%  Glycemic control for   <7.0% adults with diabetes     CBG: Recent Labs  Lab 09/23/20 1555 09/23/20 1957 09/23/20 2351 09/24/20 0410 09/24/20 0722  GLUCAP 110* 102* 104* 101*  77     Critical care time: 31 minutes    Roselie Awkward, MD [place] [facility] Pager: [phone] Cell: [phone] If no response, call [phone]

## 2020-09-24 NOTE — Progress Notes (Signed)
ID PROGRESS NOTE  Remains afebrile, but leukocytosis on lab work this morning, no other signs  Trach culture had normal respiratory flora cxr looks improved yesterday with resolution of RML infiltrate  klebseilla uti and bacteremia = continue with aztreonam and do 10 days. Currently day 7/ 10  Leukocytosis = continue to monitor and watch for other signs of infection. At this time, no need to escalate abtx  If he were to have fever or decompensate, would add gram positive coverage  Ameet Sandy B. [place] [facility] [phone]

## 2020-09-24 NOTE — Progress Notes (Signed)
Patient ET tube holder changed at 0335.

## 2020-09-25 ENCOUNTER — Inpatient Hospital Stay (HOSPITAL_COMMUNITY): Payer: Medicare Other

## 2020-09-25 DIAGNOSIS — A419 Sepsis, unspecified organism: Secondary | ICD-10-CM | POA: Diagnosis not present

## 2020-09-25 LAB — CBC WITH DIFFERENTIAL/PLATELET
Abs Immature Granulocytes: 1.65 10*3/uL — ABNORMAL HIGH (ref 0.00–0.07)
Basophils Absolute: 0.1 10*3/uL (ref 0.0–0.1)
Basophils Relative: 0 %
Eosinophils Absolute: 0 10*3/uL (ref 0.0–0.5)
Eosinophils Relative: 0 %
HCT: 26 % — ABNORMAL LOW (ref 39.0–52.0)
Hemoglobin: 8.5 g/dL — ABNORMAL LOW (ref 13.0–17.0)
Immature Granulocytes: 10 %
Lymphocytes Relative: 6 %
Lymphs Abs: 0.9 10*3/uL (ref 0.7–4.0)
MCH: 28.6 pg (ref 26.0–34.0)
MCHC: 32.7 g/dL (ref 30.0–36.0)
MCV: 87.5 fL (ref 80.0–100.0)
Monocytes Absolute: 0.8 10*3/uL (ref 0.1–1.0)
Monocytes Relative: 5 %
Neutro Abs: 13.2 10*3/uL — ABNORMAL HIGH (ref 1.7–7.7)
Neutrophils Relative %: 79 %
Platelets: 149 10*3/uL — ABNORMAL LOW (ref 150–400)
RBC: 2.97 MIL/uL — ABNORMAL LOW (ref 4.22–5.81)
RDW: 18.6 % — ABNORMAL HIGH (ref 11.5–15.5)
WBC: 16.7 10*3/uL — ABNORMAL HIGH (ref 4.0–10.5)
nRBC: 0 % (ref 0.0–0.2)

## 2020-09-25 LAB — GLUCOSE, CAPILLARY
Glucose-Capillary: 103 mg/dL — ABNORMAL HIGH (ref 70–99)
Glucose-Capillary: 116 mg/dL — ABNORMAL HIGH (ref 70–99)
Glucose-Capillary: 122 mg/dL — ABNORMAL HIGH (ref 70–99)
Glucose-Capillary: 135 mg/dL — ABNORMAL HIGH (ref 70–99)
Glucose-Capillary: 145 mg/dL — ABNORMAL HIGH (ref 70–99)
Glucose-Capillary: 153 mg/dL — ABNORMAL HIGH (ref 70–99)
Glucose-Capillary: 160 mg/dL — ABNORMAL HIGH (ref 70–99)

## 2020-09-25 LAB — COMPREHENSIVE METABOLIC PANEL
ALT: 101 U/L — ABNORMAL HIGH (ref 0–44)
AST: 56 U/L — ABNORMAL HIGH (ref 15–41)
Albumin: 2.1 g/dL — ABNORMAL LOW (ref 3.5–5.0)
Alkaline Phosphatase: 131 U/L — ABNORMAL HIGH (ref 38–126)
Anion gap: 13 (ref 5–15)
BUN: 34 mg/dL — ABNORMAL HIGH (ref 6–20)
CO2: 24 mmol/L (ref 22–32)
Calcium: 8.4 mg/dL — ABNORMAL LOW (ref 8.9–10.3)
Chloride: 104 mmol/L (ref 98–111)
Creatinine, Ser: <0.3 mg/dL — ABNORMAL LOW (ref 0.61–1.24)
Glucose, Bld: 113 mg/dL — ABNORMAL HIGH (ref 70–99)
Potassium: 3.4 mmol/L — ABNORMAL LOW (ref 3.5–5.1)
Sodium: 141 mmol/L (ref 135–145)
Total Bilirubin: 0.8 mg/dL (ref 0.3–1.2)
Total Protein: 5.5 g/dL — ABNORMAL LOW (ref 6.5–8.1)

## 2020-09-25 LAB — TRIGLYCERIDES: Triglycerides: 94 mg/dL (ref <150)

## 2020-09-25 MED ORDER — ENALAPRILAT 1.25 MG/ML IV SOLN
1.2500 mg | Freq: Four times a day (QID) | INTRAVENOUS | Status: DC | PRN
  Administered 2020-09-25: 1.25 mg via INTRAVENOUS
  Filled 2020-09-25: qty 1

## 2020-09-25 NOTE — Progress Notes (Deleted)
Subjective: No diagnosis found.   Cameron Hale is a 44 yo quadriplegic with neurofibromatosis and a neurogenic bladder.  ROS:  ROS  Allergies  Allergen Reactions  . Amoxicillin Itching  . Maxipime [Cefepime] Swelling    Facial and eye   . Morphine Itching    Past Medical History:  Diagnosis Date  . Hard of hearing   . 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 Pressure ulcer   . Quadriplegia ([place])   . Seizures ([place])   . 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 brain bleed     mri 2009  . HAND EXPLORATION    . IR GASTROSTOMY TUBE MOD SED  09/05/2020  . LUMBAR FUSION    . NEPHRECTOMY     Partial left nephrectomy   . SPINAL FUSION      Social History   Socioeconomic History  . Marital status: Married    Spouse name: Not on file  . Number of children: Not on file  . Years of education: Not on file  . Highest education level: Not on file  Occupational History  . Not on file  Tobacco Use  . Smoking status: Never Smoker  . Smokeless tobacco: Never Used  Vaping Use  . Vaping Use: Never used  Substance and Sexual Activity  . Alcohol use: Not Currently    Alcohol/week: 0.0 standard drinks  . Drug use: Never  . Sexual activity: Not Currently  Other Topics Concern  . Not on file  Social History Narrative  . Not on file   Social Determinants of Health   Financial Resource Strain:   . Difficulty of Paying Living Expenses: Not on file  Food Insecurity:   . Worried About Charity fundraiser in the Last Year: Not on file  . Ran Out of Food in the Last Year: Not on file  Transportation Needs:   . Lack of Transportation (Medical): Not on file  . Lack of Transportation (Non-Medical): Not on file  Physical Activity:   . Days of Exercise per Week: Not on file  . Minutes of Exercise per Session: Not on file  Stress:   . Feeling of Stress : Not on file  Social Connections:   . Frequency of Communication with Friends and Family:  Not on file  . Frequency of Social Gatherings with Friends and Family: Not on file  . Attends Religious Services: Not on file  . Active Member of Clubs or Organizations: Not on file  . Attends Archivist Meetings: Not on file  . Marital Status: Not on file  Intimate Partner Violence:   . Fear of Current or Ex-Partner: Not on file  . Emotionally Abused: Not on file  . Physically Abused: Not on file  . Sexually Abused: Not on file    No family history on file.  Anti-infectives: Anti-infectives (From admission, onward)   None      No current facility-administered medications for this visit.   No current outpatient medications on file.   Facility-Administered Medications Ordered in Other Visits  Medication Dose Route Frequency Provider Last Rate Last Admin  . 0.9 %  sodium chloride infusion  250 mL Intravenous Continuous Juanito Doom, MD   Stopped at 09/23/20 0507  . acetaminophen (TYLENOL) 160 MG/5ML solution 650 mg  650 mg Per Tube Q6H PRN Juanito Doom, MD   650 mg at 09/22/20 2139  . ammonium lactate (LAC-HYDRIN) 12 % lotion  1 application  1 application Topical PRN Reubin Milan, MD      . ascorbic acid (VITAMIN C) tablet 250 mg  250 mg Per Tube Daily Juanito Doom, MD   250 mg at 09/25/20 7673  . aztreonam (AZACTAM) 2 g in sodium chloride 0.9 % 100 mL IVPB  2 g Intravenous Q8H Emiliano Dyer, RPH   Stopped at 09/25/20 1414  . chlorhexidine gluconate (MEDLINE KIT) (PERIDEX) 0.12 % solution 15 mL  15 mL Mouth Rinse BID Simonne Maffucci B, MD   15 mL at 09/25/20 0858  . Chlorhexidine Gluconate Cloth 2 % PADS 6 each  6 each Topical Daily Juanito Doom, MD   6 each at 09/25/20 1532  . dexamethasone (DECADRON) 1 MG/ML solution 2 mg  2 mg Per Tube Daily Erick Colace, NP   2 mg at 09/25/20 4193  . enalaprilat (VASOTEC) injection 1.25 mg  1.25 mg Intravenous Q6H PRN Olalere, Adewale A, MD   1.25 mg at 09/25/20 1841  . feeding supplement (VITAL  AF 1.2 CAL) liquid 1,000 mL  1,000 mL Per Tube Continuous Juanito Doom, MD 60 mL/hr at 09/25/20 1827 1,000 mL at 09/25/20 1827  . fentaNYL (SUBLIMAZE) injection 25 mcg  25 mcg Intravenous Q15 min PRN Erick Colace, NP   25 mcg at 09/25/20 1530  . fentaNYL (SUBLIMAZE) injection 25-100 mcg  25-100 mcg Intravenous Q30 min PRN Erick Colace, NP   25 mcg at 09/22/20 1743  . ferrous sulfate 300 (60 Fe) MG/5ML syrup 300 mg  300 mg Per Tube Q breakfast Simonne Maffucci B, MD   300 mg at 09/25/20 0858  . free water 200 mL  200 mL Per Tube Q6H Erick Colace, NP   200 mL at 09/25/20 1835  . Gerhardt's butt cream   Topical TID Juanito Doom, MD   1 application at 79/02/40 1536  . insulin aspart (novoLOG) injection 0-9 Units  0-9 Units Subcutaneous Q4H Erick Colace, NP   2 Units at 09/25/20 1557  . ipratropium-albuterol (DUONEB) 0.5-2.5 (3) MG/3ML nebulizer solution 3 mL  3 mL Nebulization Q6H McQuaid, Douglas B, MD   3 mL at 09/25/20 1500  . labetalol (NORMODYNE) injection 20 mg  20 mg Intravenous Q10 min PRN Simonne Maffucci B, MD      . lactobacillus (FLORANEX/LACTINEX) granules 1 g  1 g Per Tube Q8H Erick Colace, NP   1 g at 09/25/20 1248  . levETIRAcetam (KEPPRA) 100 MG/ML solution 1,500 mg  1,500 mg Per Tube BID Erick Colace, NP   1,500 mg at 09/25/20 9735  . MEDLINE mouth rinse  15 mL Mouth Rinse 10 times per day Juanito Doom, MD   15 mL at 09/25/20 1835  . ondansetron (ZOFRAN) injection 4 mg  4 mg Intravenous Q6H PRN Reubin Milan, MD      . pantoprazole (PROTONIX) injection 40 mg  40 mg Intravenous Q12H Reubin Milan, MD   40 mg at 09/25/20 0916  . polyvinyl alcohol (LIQUIFILM TEARS) 1.4 % ophthalmic solution 1 drop  1 drop Both Eyes PRN Simonne Maffucci B, MD         Objective: Vital signs in last 24 hours: There were no vitals taken for this visit.  Intake/Output from previous day: No intake/output data recorded. Intake/Output this  shift: _0 @   Physical Exam  Lab Results:  Results for orders placed or performed during the hospital encounter of 10/07/2020 (  from the past 24 hour(s))  Glucose, capillary     Status: Abnormal   Collection Time: 09/24/20  9:37 PM  Result Value Ref Range   Glucose-Capillary 133 (H) 70 - 99 mg/dL  Glucose, capillary     Status: Abnormal   Collection Time: 09/25/20 12:24 AM  Result Value Ref Range   Glucose-Capillary 116 (H) 70 - 99 mg/dL  Triglycerides     Status: None   Collection Time: 09/25/20  3:03 AM  Result Value Ref Range   Triglycerides 94 <150 mg/dL  CBC with Differential     Status: Abnormal   Collection Time: 09/25/20  3:03 AM  Result Value Ref Range   WBC 16.7 (H) 4.0 - 10.5 K/uL   RBC 2.97 (L) 4.22 - 5.81 MIL/uL   Hemoglobin 8.5 (L) 13.0 - 17.0 g/dL   HCT 26.0 (L) 39 - 52 %   MCV 87.5 80.0 - 100.0 fL   MCH 28.6 26.0 - 34.0 pg   MCHC 32.7 30.0 - 36.0 g/dL   RDW 18.6 (H) 11.5 - 15.5 %   Platelets 149 (L) 150 - 400 K/uL   nRBC 0.0 0.0 - 0.2 %   Neutrophils Relative % 79 %   Neutro Abs 13.2 (H) 1.7 - 7.7 K/uL   Lymphocytes Relative 6 %   Lymphs Abs 0.9 0.7 - 4.0 K/uL   Monocytes Relative 5 %   Monocytes Absolute 0.8 0.1 - 1.0 K/uL   Eosinophils Relative 0 %   Eosinophils Absolute 0.0 0.0 - 0.5 K/uL   Basophils Relative 0 %   Basophils Absolute 0.1 0.0 - 0.1 K/uL   WBC Morphology MILD LEFT SHIFT (1-5% METAS, OCC MYELO, OCC BANDS)    Immature Granulocytes 10 %   Abs Immature Granulocytes 1.65 (H) 0.00 - 0.07 K/uL   Polychromasia PRESENT    Target Cells PRESENT    Ovalocytes PRESENT   Comprehensive metabolic panel     Status: Abnormal   Collection Time: 09/25/20  3:03 AM  Result Value Ref Range   Sodium 141 135 - 145 mmol/L   Potassium 3.4 (L) 3.5 - 5.1 mmol/L   Chloride 104 98 - 111 mmol/L   CO2 24 22 - 32 mmol/L   Glucose, Bld 113 (H) 70 - 99 mg/dL   BUN 34 (H) 6 - 20 mg/dL   Creatinine, Ser <0.30 (L) 0.61 - 1.24 mg/dL   Calcium 8.4 (L) 8.9 -  10.3 mg/dL   Total Protein 5.5 (L) 6.5 - 8.1 g/dL   Albumin 2.1 (L) 3.5 - 5.0 g/dL   AST 56 (H) 15 - 41 U/L   ALT 101 (H) 0 - 44 U/L   Alkaline Phosphatase 131 (H) 38 - 126 U/L   Total Bilirubin 0.8 0.3 - 1.2 mg/dL   GFR, Estimated NOT CALCULATED >60 mL/min   Anion gap 13 5 - 15  Glucose, capillary     Status: Abnormal   Collection Time: 09/25/20  4:31 AM  Result Value Ref Range   Glucose-Capillary 122 (H) 70 - 99 mg/dL  Glucose, capillary     Status: Abnormal   Collection Time: 09/25/20  7:48 AM  Result Value Ref Range   Glucose-Capillary 103 (H) 70 - 99 mg/dL   Comment 1 Notify RN    Comment 2 Document in Chart   Glucose, capillary     Status: Abnormal   Collection Time: 09/25/20 11:55 AM  Result Value Ref Range   Glucose-Capillary 160 (H) 70 - 99 mg/dL  Comment 1 Notify RN    Comment 2 Document in Chart   Glucose, capillary     Status: Abnormal   Collection Time: 09/25/20  3:51 PM  Result Value Ref Range   Glucose-Capillary 153 (H) 70 - 99 mg/dL   Comment 1 Notify RN    Comment 2 Document in Chart     BMET Recent Labs    09/24/20 0240 09/25/20 0303  NA 142 141  K 3.9 3.4*  CL 108 104  CO2 24 24  GLUCOSE 104* 113*  BUN 34* 34*  CREATININE <0.30* <0.30*  CALCIUM 8.1* 8.4*   PT/INR No results for input(s): LABPROT, INR in the last 72 hours. ABG No results for input(s): PHART, HCO3 in the last 72 hours.  Invalid input(s): PCO2, PO2  Studies/Results: DG Chest Port 1 View  Result Date: 09/25/2020 CLINICAL DATA:  Acute hypercapnic respiratory failure EXAM: PORTABLE CHEST 1 VIEW COMPARISON:  Two days ago FINDINGS: Improved aeration. There is still hazy pulmonary opacities at the bases. No visible effusion or pneumothorax. Endotracheal tube with tip just below the clavicular heads. Extensive spinal fusion. Normal heart size. IMPRESSION: Improving infiltrates. Stable endotracheal tube positioning. Electronically Signed   By: Monte Fantasia M.D.   On: 09/25/2020  06:47     Assessment/Plan: No problem-specific Assessment & Plan notes found for this encounter.   No orders of the defined types were placed in this encounter.    No orders of the defined types were placed in this encounter.    No follow-ups on file.    CC: ***     Irine Seal 09/25/2020 [phone]

## 2020-09-25 NOTE — Progress Notes (Signed)
[facility]   Reason for visit: Follow up on Klebsiella bacteremia due to UTI  Interval History: leukocytosis of 16.7, remains afebrile.  No new complaints.   Aztreonam day 8/10   Physical Exam: Constitutional:  Vitals:   09/25/20 1200 09/25/20 1300  BP: (!) 148/91 (!) 163/102  Pulse: 73 78  Resp: 15 16  Temp: (!) 97.4 F (36.3 C)   SpO2: 97% 98%   patient appears in NAD HENT: + tongue fasciculations Respiratory: respiratory effort on vent Cardiovascular: RRR GI: soft, nt, nd Neuro: alert but does not respond  Review of Systems: Constitutional: negative for fevers and chills Integument/breast: negative for rash   Lab Results  Component Value Date   WBC 16.7 (H) 09/25/2020   HGB 8.5 (L) 09/25/2020   HCT 26.0 (L) 09/25/2020   MCV 87.5 09/25/2020   PLT 149 (L) 09/25/2020    Lab Results  Component Value Date   CREATININE <0.30 (L) 09/25/2020   BUN 34 (H) 09/25/2020   NA 141 09/25/2020   K 3.4 (L) 09/25/2020   CL 104 09/25/2020   CO2 24 09/25/2020    Lab Results  Component Value Date   ALT 101 (H) 09/25/2020   AST 56 (H) 09/25/2020   ALKPHOS 131 (H) 09/25/2020     Microbiology: Recent Results (from the past 240 hour(s))  Blood Culture (routine x 2)     Status: Abnormal   Collection Time: 09/26/2020  5:41 PM   Specimen: Left Antecubital; Blood  Result Value Ref Range Status   Specimen Description   Final    LEFT ANTECUBITAL Performed at [facility], [address], [place], [place] [address]    Special Requests   Final    BOTTLES DRAWN AEROBIC AND ANAEROBIC Blood Culture adequate volume Performed at [facility], [address] [place], [place] [address]    Culture  Setup Time   Final    ANAEROBIC BOTTLE GRAM NEGATIVE RODS WBC PRESENT, PREDOMINANTLY PMN Gram Stain Report Called to,Read Back By and Verified With: MYRICK,B @1145  ON 09/19/20 BY JONES,T [facility] CRITICAL VALUE NOTED.  VALUE IS CONSISTENT WITH PREVIOUSLY REPORTED  AND CALLED VALUE.    Culture (A)  Final    KLEBSIELLA PNEUMONIAE SUSCEPTIBILITIES PERFORMED ON PREVIOUS CULTURE WITHIN THE LAST 5 DAYS. Performed at [facility], [place] [address], [place], [place] [address]    Report Status 09/21/2020 FINAL  Final  Blood Culture (routine x 2)     Status: Abnormal   Collection Time: 09/19/2020  5:41 PM   Specimen: BLOOD RIGHT ARM  Result Value Ref Range Status   Specimen Description   Final    BLOOD RIGHT ARM Performed at [facility], [address] [place], [place] [address]    Special Requests   Final    BOTTLES DRAWN AEROBIC AND ANAEROBIC Blood Culture adequate volume Performed at [facility], [address], [place], [place] [address]    Culture  Setup Time   Final    AEROBIC BOTTLE GRAM NEGATIVE RODS WBC PRESENT, PREDOMINANTLY PMN Gram Stain Report Called to,Read Back By and Verified With: MYRICK,B @1145  ON 09/19/20 BY JONES,T [facility] CRITICAL RESULT CALLED TO, READ BACK BY AND VERIFIED WITH: PHARMD J GADHIA 09/19/20 AT 1841 SK Performed at [facility], [place] [address], [place],  [address]    Culture KLEBSIELLA PNEUMONIAE (A)  Final   Report Status 09/21/2020 FINAL  Final   Organism ID, Bacteria KLEBSIELLA PNEUMONIAE  Final  Susceptibility   Klebsiella pneumoniae - MIC*    AMPICILLIN >=32 RESISTANT Resistant     CEFAZOLIN >=64 RESISTANT Resistant     CEFEPIME <=0.12 SENSITIVE Sensitive     CEFTAZIDIME 4 SENSITIVE Sensitive     CEFTRIAXONE 1 SENSITIVE Sensitive     CIPROFLOXACIN 0.5 SENSITIVE Sensitive     GENTAMICIN <=1 SENSITIVE Sensitive     IMIPENEM <=0.25 SENSITIVE Sensitive     TRIMETH/SULFA >=320 RESISTANT Resistant     AMPICILLIN/SULBACTAM >=32 RESISTANT Resistant     PIP/TAZO <=4 SENSITIVE Sensitive     * KLEBSIELLA PNEUMONIAE  Blood Culture ID Panel (Reflexed)     Status: Abnormal   Collection Time: 10/13/2020  5:41 PM  Result Value Ref Range Status   Enterococcus faecalis NOT DETECTED NOT DETECTED Final    Enterococcus Faecium NOT DETECTED NOT DETECTED Final   Listeria monocytogenes NOT DETECTED NOT DETECTED Final   Staphylococcus species NOT DETECTED NOT DETECTED Final   Staphylococcus aureus (BCID) NOT DETECTED NOT DETECTED Final   Staphylococcus epidermidis NOT DETECTED NOT DETECTED Final   Staphylococcus lugdunensis NOT DETECTED NOT DETECTED Final   Streptococcus species NOT DETECTED NOT DETECTED Final   Streptococcus agalactiae NOT DETECTED NOT DETECTED Final   Streptococcus pneumoniae NOT DETECTED NOT DETECTED Final   Streptococcus pyogenes NOT DETECTED NOT DETECTED Final   A.calcoaceticus-baumannii NOT DETECTED NOT DETECTED Final   Bacteroides fragilis NOT DETECTED NOT DETECTED Final   Enterobacterales DETECTED (A) NOT DETECTED Final    Comment: Enterobacterales represent a large order of gram negative bacteria, not a single organism. CRITICAL RESULT CALLED TO, READ BACK BY AND VERIFIED WITH: PHARMD J GADHIA 09/19/20 AT 1841 SK     Enterobacter cloacae complex NOT DETECTED NOT DETECTED Final   Escherichia coli NOT DETECTED NOT DETECTED Final   Klebsiella aerogenes NOT DETECTED NOT DETECTED Final   Klebsiella oxytoca NOT DETECTED NOT DETECTED Final   Klebsiella pneumoniae DETECTED (A) NOT DETECTED Final    Comment: CRITICAL RESULT CALLED TO, READ BACK BY AND VERIFIED WITH: PHARMD J GADHIA 09/19/20 AT 1841 SK     Proteus species NOT DETECTED NOT DETECTED Final   Salmonella species NOT DETECTED NOT DETECTED Final   Serratia marcescens NOT DETECTED NOT DETECTED Final   Haemophilus influenzae NOT DETECTED NOT DETECTED Final   Neisseria meningitidis NOT DETECTED NOT DETECTED Final   Pseudomonas aeruginosa NOT DETECTED NOT DETECTED Final   Stenotrophomonas maltophilia NOT DETECTED NOT DETECTED Final   Candida albicans NOT DETECTED NOT DETECTED Final   Candida auris NOT DETECTED NOT DETECTED Final   Candida glabrata NOT DETECTED NOT DETECTED Final   Candida krusei NOT DETECTED NOT  DETECTED Final   Candida parapsilosis NOT DETECTED NOT DETECTED Final   Candida tropicalis NOT DETECTED NOT DETECTED Final   Cryptococcus neoformans/gattii NOT DETECTED NOT DETECTED Final   CTX-M ESBL NOT DETECTED NOT DETECTED Final   Carbapenem resistance IMP NOT DETECTED NOT DETECTED Final   Carbapenem resistance KPC NOT DETECTED NOT DETECTED Final   Carbapenem resistance NDM NOT DETECTED NOT DETECTED Final   Carbapenem resist OXA 48 LIKE NOT DETECTED NOT DETECTED Final   Carbapenem resistance VIM NOT DETECTED NOT DETECTED Final    Comment: Performed at [facility], [address], [place], [place] [address]  Respiratory Panel by RT PCR (Flu A&B, Covid) - Nasopharyngeal Swab     Status: None   Collection Time: 10/13/2020  5:48 PM   Specimen: Nasopharyngeal Swab  Result Value Ref Range Status  SARS Coronavirus 2 by RT PCR NEGATIVE NEGATIVE Final    Comment: (NOTE) SARS-CoV-2 target nucleic acids are NOT DETECTED.  The SARS-CoV-2 RNA is generally detectable in upper respiratoy specimens during the acute phase of infection. The lowest concentration of SARS-CoV-2 viral copies this assay can detect is 131 copies/mL. A negative result does not preclude SARS-Cov-2 infection and should not be used as the sole basis for treatment or other patient management decisions. A negative result may occur with  improper specimen collection/handling, submission of specimen other than nasopharyngeal swab, presence of viral mutation(s) within the areas targeted by this assay, and inadequate number of viral copies (<131 copies/mL). A negative result must be combined with clinical observations, patient history, and epidemiological information. The expected result is Negative.  Fact Sheet for Patients:  PinkCheek.be  Fact Sheet for Healthcare Providers:  GravelBags.it  This test is no t yet approved or cleared by the [place] FDA and    has been authorized for detection and/or diagnosis of SARS-CoV-2 by FDA under an Emergency Use Authorization (EUA). This EUA will remain  in effect (meaning this test can be used) for the duration of the COVID-19 declaration under Section 564(b)(1) of the Act, 21 U.S.C. section 360bbb-3(b)(1), unless the authorization is terminated or revoked sooner.     Influenza A by PCR NEGATIVE NEGATIVE Final   Influenza B by PCR NEGATIVE NEGATIVE Final    Comment: (NOTE) The Xpert Xpress SARS-CoV-2/FLU/RSV assay is intended as an aid in  the diagnosis of influenza from Nasopharyngeal swab specimens and  should not be used as a sole basis for treatment. Nasal washings and  aspirates are unacceptable for Xpert Xpress SARS-CoV-2/FLU/RSV  testing.  Fact Sheet for Patients: PinkCheek.be  Fact Sheet for Healthcare Providers: GravelBags.it  This test is not yet approved or cleared by the [place] FDA and  has been authorized for detection and/or diagnosis of SARS-CoV-2 by  FDA under an Emergency Use Authorization (EUA). This EUA will remain  in effect (meaning this test can be used) for the duration of the  Covid-19 declaration under Section 564(b)(1) of the Act, 21  U.S.C. section 360bbb-3(b)(1), unless the authorization is  terminated or revoked. Performed at [facility], [address], [place], [place] [address]   Urine culture     Status: Abnormal   Collection Time: 09/19/2020  6:02 PM   Specimen: In/Out Cath Urine  Result Value Ref Range Status   Specimen Description   Final    IN/OUT CATH URINE Performed at [facility], [address], [place], [place] [address]    Special Requests   Final    NONE Performed at [facility], [address] [place], [place] [address]    Culture >=100,000 COLONIES/mL KLEBSIELLA PNEUMONIAE (A)  Final   Report Status 09/21/2020 FINAL  Final   Organism ID, Bacteria KLEBSIELLA PNEUMONIAE (A)   Final      Susceptibility   Klebsiella pneumoniae - MIC*    AMPICILLIN RESISTANT Resistant     CEFAZOLIN <=4 SENSITIVE Sensitive     CEFEPIME <=0.12 SENSITIVE Sensitive     CEFTRIAXONE <=0.25 SENSITIVE Sensitive     CIPROFLOXACIN 0.5 SENSITIVE Sensitive     GENTAMICIN <=1 SENSITIVE Sensitive     IMIPENEM <=0.25 SENSITIVE Sensitive     NITROFURANTOIN 64 INTERMEDIATE Intermediate     TRIMETH/SULFA >=320 RESISTANT Resistant     AMPICILLIN/SULBACTAM 4 SENSITIVE Sensitive     PIP/TAZO <=4 SENSITIVE Sensitive     * >=100,000 COLONIES/mL  KLEBSIELLA PNEUMONIAE  MRSA PCR Screening     Status: None   Collection Time: 09/19/20 11:59 AM   Specimen: Nasal Mucosa; Nasopharyngeal  Result Value Ref Range Status   MRSA by PCR NEGATIVE NEGATIVE Final    Comment:        The GeneXpert MRSA Assay (FDA approved for NASAL specimens only), is one component of a comprehensive MRSA colonization surveillance program. It is not intended to diagnose MRSA infection nor to guide or monitor treatment for MRSA infections. Performed at [facility], [place] [address] [place], [place] [address]   Culture, respiratory (non-expectorated)     Status: None   Collection Time: 09/19/20  3:02 PM   Specimen: Tracheal Aspirate; Respiratory  Result Value Ref Range Status   Specimen Description   Final    TRACHEAL ASPIRATE Performed at [facility] [place] [address], [place], [place] [address]    Special Requests   Final    NONE Performed at [facility], [place] [address], [place], [place] [address]    Gram Stain   Final    ABUNDANT WBC PRESENT,BOTH PMN AND MONONUCLEAR RARE SQUAMOUS EPITHELIAL CELLS PRESENT RARE YEAST    Culture   Final    RARE Normal respiratory flora-no Staph aureus or Pseudomonas seen Performed at [facility], [address], [place], [place] [address]    Report Status 09/22/2020 FINAL  Final  Culture, blood (Routine X 2) w Reflex  to ID Panel     Status: None (Preliminary result)   Collection Time: 09/21/20 10:37 AM   Specimen: BLOOD RIGHT HAND  Result Value Ref Range Status   Specimen Description   Final    BLOOD RIGHT HAND Performed at [place] [address], [place], [place] [address]    Special Requests   Final    BOTTLES DRAWN AEROBIC AND ANAEROBIC Blood Culture adequate volume Performed at [place] [address] [place], [place] [address]    Culture   Final    NO GROWTH 4 DAYS Performed at [facility], [place] [address], [place], [place] [address]    Report Status PENDING  Incomplete  Culture, blood (Routine X 2) w Reflex to ID Panel     Status: None (Preliminary result)   Collection Time: 09/21/20 10:46 AM   Specimen: BLOOD LEFT HAND  Result Value Ref Range Status   Specimen Description   Final    BLOOD LEFT HAND Performed at [place] [address], [place], [place] [address]    Special Requests   Final    BOTTLES DRAWN AEROBIC AND ANAEROBIC Blood Culture adequate volume Performed at [place] [address] [place], [place] [address]    Culture   Final    NO GROWTH 4 DAYS Performed at [facility], [place] [address], [place], [place] [address]    Report Status PENDING  Incomplete  Culture, respiratory (non-expectorated)     Status: None   Collection Time: 09/22/20 12:17 AM   Specimen: Tracheal Aspirate; Respiratory  Result Value Ref Range Status   Specimen Description   Final    TRACHEAL ASPIRATE Performed at [place] [address], [place], [place] [address]    Special Requests   Final    Normal Performed at [facility], [place] [address], [place], [place] [address]    Gram Stain   Final  MODERATE WBC PRESENT, PREDOMINANTLY PMN FEW SQUAMOUS EPITHELIAL CELLS PRESENT FEW GRAM POSITIVE COCCI    Culture   Final    Normal respiratory  flora-no Staph aureus or Pseudomonas seen Performed at [facility], [address], [place], [place] [address]    Report Status 09/24/2020 FINAL  Final    Impression/Plan:  1. Bacteremia - on appropriate treatment with aztreonam and can continue for 10 days total.    2.  Leukocytosis - some increase since 11/5.  Stable today.  Likely secondary to steroids, possibly some aspiration.  No changes in plan as above.  No fever.    3.  Respiratory failure - CXR with improving infiltrates.  Continue treatment as above.

## 2020-09-25 NOTE — Progress Notes (Signed)
[facility] ([facility]) Community Based Palliative Care       This patient is enrolled in our palliative care services in the community.  [facility] will continue to follow for any discharge planning needs and to coordinate continuation of palliative care.   If you have questions or need assistance, please call [phone] or contact the hospital Liaison listed on AMION.     Thank you for the opportunity to participate in this patient's care.     Chrislyn King, BSN, RN [facility] Hospital Liaison   [phone]   

## 2020-09-25 NOTE — Progress Notes (Addendum)
NAME:  Cameron Hale, [name]:  [id number], DOB:  Jan 13, 1976, LOS: 7 ADMISSION DATE:  10/03/2020, CONSULTATION DATE:  11/2 REFERRING MD:  Dyann Kief, CHIEF COMPLAINT:  Confusion   Brief History   44 y/o male with quadriplegia and recent intubation admitted from [facility] in the setting of respiratory failure likely from respiratory muscle weakness exacerbated by sepsis of a urinary origin.  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 G-tube (09/05/2020)  Significant Hospital Events   Admitted to [facility] 11/1>>11/2 Transferred to [facility] 11/2>>, on pressors. 11/3: Pressors off.  Growing K pneumoniae in blood and urine, antibiotics narrowed. EEG obtained. Was neg for seizure Triggered apnea alarm on vent. Fent gtt stopped. Not able to wean  11/4 more awake. On SBT volumes look good. Extubated, then re-intubated 11/5 family conversation: doesn't want tracheostomy, want to give him more time to try to get stronger 11/8 uneventful  Consults:  PCCM  Procedures:  11/2 ETT > 11/4, 11/4 >   Significant Diagnostic Tests:  11/1 CT >> extensive meningiomatosis noted unchanged. Bifrontal edema is stable. No acute intracranial abnormality.  11/3 EEG: no seizure  Micro Data:  11/1 Flu >> negative 11/1 Covid >> negative 11/1 Blood culture >> K pneumoniae 11/1 Urine culture >> K pneumoniae 11/5 resp culture> normal flora  Antimicrobials:  11/1 Cefepime >> 11/2 11/2 Metronidazole >> 11/3 11/2 Vancomycin >> 11/3  Azactam 11/2 >>  Interim history/subjective:   He is awake, alert and looking around Tongue fasciculations still present Was not able to purposefully respond to questions  Objective   Blood pressure 138/82, pulse 77, temperature 97.7 F (36.5 C), temperature source Oral, resp. rate 17, height 5\' 10"  (1.778 m), weight 78.7 kg, SpO2 97 %.    Vent Mode:  PSV;CPAP FiO2 (%):  [30 %] 30 % Set Rate:  [12 bmp] 12 bmp Vt Set:  [580 mL] 580 mL PEEP:  [5 cmH20] 5 cmH20 Pressure Support:  [10 cmH20] 10 cmH20 Plateau Pressure:  [14 cmH20-15 cmH20] 15 cmH20   Intake/Output Summary (Last 24 hours) at 09/25/2020 0910 Last data filed at 09/25/2020 0700 Gross per 24 hour  Intake 795.63 ml  Output 1900 ml  Net -1104.37 ml   Filed Weights   10/01/2020 1822 09/20/20 0444 09/23/20 0437  Weight: 72.4 kg 72.4 kg 78.7 kg    Examination: General: On vent, chronically ill-appearing HENT: Moist oral mucosa, endotracheal tube in place PULM: Clear breath sounds bilaterally CV: S1-S2 appreciated, no murmur appreciated GI: Bowel sounds appreciated, abdomen is soft, nontender MSK: Normal bulk Neuro: Tongue fasciculations  Resolved Hospital Problem list   Left lung collapse/possible mucus plug due to neuromuscular weakness, made worse in setting of sepsis > resolved  Assessment & Plan:   Sepsis/septic shock Klebsiella pneumonia bacteremia Shock-improved Possible cefepime allergy -Continue aztreonam total 10 days of treatment -Leukocytosis is stable  Acute respiratory failure with hypoxemia Inability to clear secretions on possible inability to protect airway -Will likely need tracheostomy  -We will continue pressure support trials -full Mechanical ventilation at night -Already failed extubation on 60/7  Metabolic encephalopathy superimposed on neurofibroma of multiple sites Quadriplegia Tongue fasciculations unchanged from baseline  Seizures Diffuse encephalopathy -Continue Keppra  Atelectasis is much improved -Continue pressure support -Still requires full ventilation at night  Symptomatic anemia -Continue twice daily PPI -Monitor closely -Transfuse per protocol -Posttransfusion on 11/1 for hemoglobin of 6.2  Severe protein calorie malnutrition PEG in place -Continue  tube feeding  Hyperglycemia -SSI -Goal 140-180  Best  practice:  Diet: tube feeding Pain/Anxiety/Delirium protocol (if indicated): as above VAP protocol (if indicated): yes DVT prophylaxis: SCD's with thrombocytopenia GI prophylaxis: Pantoprazole for stress ulcer prophylaxis Glucose control: monitor Mobility: bed rest Code Status: full Family Communication: Dr [place] Bells spoke to his wife at bedside at length, asked her to return with family for a family meeting on 11/9  Disposition: icu  Labs   CBC: Recent Labs  Lab 09/21/20 0248 09/22/20 0248 09/23/20 0243 09/24/20 0240 09/25/20 0303  WBC 7.6 11.4* 10.8* 17.1* 16.7*  NEUTROABS 6.5 9.5* 8.8* 14.1* 13.2*  HGB 8.6* 9.1* 8.9* 8.4* 8.5*  HCT 26.1* 28.6* 28.2* 26.4* 26.0*  MCV 86.7 89.9 89.5 89.2 87.5  PLT 67* 79* 92* 124* 149*    Basic Metabolic Panel: Recent Labs  Lab 10/12/2020 1802 09/19/20 0332 09/19/20 1339 09/19/20 1708 09/20/20 0308 09/20/20 0308 09/20/20 1711 09/21/20 0248 09/22/20 0751 09/23/20 0243 09/24/20 0240 09/25/20 0303  NA 131*   < >  --   --  140   < >  --  141 140 141 142 141  K 4.3   < >  --   --  3.8   < >  --  2.8* 4.1 4.5 3.9 3.4*  CL 101   < >  --   --  113*   < >  --  112* 109 109 108 104  CO2 27   < >  --   --  20*   < >  --  21* 20* 22 24 24   GLUCOSE 108*   < >  --   --  120*   < >  --  224* 139* 98 104* 113*  BUN 37*   < >  --   --  29*   < >  --  33* 34* 37* 34* 34*  CREATININE <0.30*   < >  --   --  0.32*   < >  --  0.36* 0.34* 0.37* <0.30* <0.30*  CALCIUM 7.2*   < >  --   --  7.6*   < >  --  7.8* 7.7* 8.1* 8.1* 8.4*  MG 2.0  --  1.8 1.9 2.0  --  1.9  --   --   --   --   --   PHOS 2.4*  --  3.1 3.2 3.4  --  3.2  --   --   --   --   --    < > = values in this interval not displayed.   GFR: CrCl cannot be calculated (This lab value cannot be used to calculate CrCl because it is not a number: <0.30). Recent Labs  Lab 10/15/2020 1802 10/06/2020 1952 09/19/20 0332 09/22/20 0248 09/23/20 0243 09/24/20 0240 09/25/20 0303  WBC 5.2  --    < >  11.4* 10.8* 17.1* 16.7*  LATICACIDVEN 0.9 1.2  --   --   --   --   --    < > = values in this interval not displayed.    Liver Function Tests: Recent Labs  Lab 09/20/20 0308 09/21/20 0248 09/23/20 0243 09/24/20 0240 09/25/20 0303  AST 43* 29 30 33 56*  ALT 76* 68* 66* 62* 101*  ALKPHOS 76 77 85 105 131*  BILITOT 0.4 0.2* 0.5 0.5 0.8  PROT 4.1* 4.3* 4.8* 4.8* 5.5*  ALBUMIN 1.7* 1.6* 1.8* 1.8* 2.1*   No results for input(s): LIPASE, AMYLASE  in the last 168 hours. No results for input(s): AMMONIA in the last 168 hours.  ABG    Component Value Date/Time   PHART 7.252 (L) 09/21/2020 1355   PCO2ART 49.9 (H) 09/21/2020 1355   PO2ART 96.6 09/21/2020 1355   HCO3 21.2 09/21/2020 1355   TCO2 28 08/22/2020 1854   ACIDBASEDEF 5.4 (H) 09/21/2020 1355   O2SAT 97.5 09/21/2020 1355     Coagulation Profile: Recent Labs  Lab 10/07/2020 1802  INR 1.0    Cardiac Enzymes: No results for input(s): CKTOTAL, CKMB, CKMBINDEX, TROPONINI in the last 168 hours.  HbA1C: Hgb A1c MFr Bld  Date/Time Value Ref Range Status  08/14/2020 04:55 AM 6.1 (H) 4.8 - 5.6 % Final    Comment:    (NOTE) Pre diabetes:          5.7%-6.4%  Diabetes:              >6.4%  Glycemic control for   <7.0% adults with diabetes     CBG: Recent Labs  Lab 09/24/20 1541 09/24/20 2137 09/25/20 0024 09/25/20 0431 09/25/20 0748  GLUCAP 130* 133* 116* 122* 103*   The patient is critically ill with multiple organ systems failure and requires high complexity decision making for assessment and support, frequent evaluation and titration of therapies, application of advanced monitoring technologies and extensive interpretation of multiple databases. Critical Care Time devoted to patient care services described in this note independent of APP/resident time (if applicable)  is 32 minutes.   Sherrilyn Rist MD [facility] Personal pager: [phone] If unanswered, please page CCM On-call:  [phone]

## 2020-09-25 NOTE — Progress Notes (Signed)
Pharmacy Antibiotic Note  Cameron Hale is a 44 y.o. male admitted on 10/09/2020 with AMS requiring intubation.  Found to have Klebsiella bacteremia/UTI. Pharmacy has been consulted for aztreonam dosing as patient may have had a reaction to cefepime.  Day #8 total abx, #7 aztreonam - plan 10 days total per ID notes - Afebrile - WBC 16.7 down slightly, on dexamethasone - SCr stable  Plan: Continue Aztreonam 2g IV q8h Follow up renal function & cultures Per ID, plan 10d therapy  Height: 5\' 10"  (177.8 cm) Weight: 78.7 kg (173 lb 8 oz) IBW/kg (Calculated) : 73  Temp (24hrs), Avg:98.2 F (36.8 C), Min:97.6 F (36.4 C), Max:98.7 F (37.1 C)  Recent Labs  Lab 10/16/2020 1802 10/08/2020 1952 09/19/20 0332 09/21/20 0248 09/22/20 0248 09/22/20 0751 09/23/20 0243 09/24/20 0240 09/25/20 0303  WBC 5.2  --    < > 7.6 11.4*  --  10.8* 17.1* 16.7*  CREATININE <0.30*  --    < > 0.36*  --  0.34* 0.37* <0.30* <0.30*  LATICACIDVEN 0.9 1.2  --   --   --   --   --   --   --    < > = values in this interval not displayed.    CrCl cannot be calculated (This lab value cannot be used to calculate CrCl because it is not a number: <0.30).    Allergies  Allergen Reactions  . Amoxicillin Itching  . Maxipime [Cefepime] Swelling    Facial and eye   . Morphine Itching    Antimicrobials this admission:  11/1 Vanc >> 11/2 11/1 Cefepime >> 11/2 11/1 Flagyl >> 11/3 11/2 Aztreonam >>  Dose adjustments this admission:   Microbiology results:  11/1 Flu/COVID: neg 11/1 BCx: 2/4 bottles GNR, BCID = K. Pneumoniae - resistant amp, ancef, bactrim, unasyn, per MIC intermediate to Cipro 11/1 UCx: >100k Klebsiella pneumonia (R amp, bactrim, I nitro) 11/2 MRSA PCR: neg 11/2 TA: reincubating 11/4 repeat BCx: ngtd 11/5 TA: few GPC  Thank you for allowing pharmacy to be a part of this patient's care.  Adrian Saran, PharmD, BCPS [phone] until 3pm 09/25/2020 10:32 AM

## 2020-09-25 NOTE — Progress Notes (Signed)
Spoke extensively to patient's mother at bedside  The need for possible tracheostomy was discussed She is still hopeful that he will not need tracheostomy

## 2020-09-26 ENCOUNTER — Encounter (HOSPITAL_BASED_OUTPATIENT_CLINIC_OR_DEPARTMENT_OTHER): Payer: Medicare Other | Admitting: Internal Medicine

## 2020-09-26 ENCOUNTER — Ambulatory Visit: Payer: Medicare Other | Admitting: Urology

## 2020-09-26 DIAGNOSIS — A419 Sepsis, unspecified organism: Secondary | ICD-10-CM | POA: Diagnosis not present

## 2020-09-26 LAB — CULTURE, BLOOD (ROUTINE X 2)
Culture: NO GROWTH
Culture: NO GROWTH
Special Requests: ADEQUATE
Special Requests: ADEQUATE

## 2020-09-26 LAB — CBC WITH DIFFERENTIAL/PLATELET
Abs Immature Granulocytes: 0.94 10*3/uL — ABNORMAL HIGH (ref 0.00–0.07)
Basophils Absolute: 0 10*3/uL (ref 0.0–0.1)
Basophils Relative: 0 %
Eosinophils Absolute: 0 10*3/uL (ref 0.0–0.5)
Eosinophils Relative: 0 %
HCT: 25.8 % — ABNORMAL LOW (ref 39.0–52.0)
Hemoglobin: 8.3 g/dL — ABNORMAL LOW (ref 13.0–17.0)
Immature Granulocytes: 7 %
Lymphocytes Relative: 5 %
Lymphs Abs: 0.7 10*3/uL (ref 0.7–4.0)
MCH: 27.9 pg (ref 26.0–34.0)
MCHC: 32.2 g/dL (ref 30.0–36.0)
MCV: 86.9 fL (ref 80.0–100.0)
Monocytes Absolute: 0.8 10*3/uL (ref 0.1–1.0)
Monocytes Relative: 6 %
Neutro Abs: 10.7 10*3/uL — ABNORMAL HIGH (ref 1.7–7.7)
Neutrophils Relative %: 82 %
Platelets: 157 10*3/uL (ref 150–400)
RBC: 2.97 MIL/uL — ABNORMAL LOW (ref 4.22–5.81)
RDW: 18.7 % — ABNORMAL HIGH (ref 11.5–15.5)
WBC: 13 10*3/uL — ABNORMAL HIGH (ref 4.0–10.5)
nRBC: 0 % (ref 0.0–0.2)

## 2020-09-26 LAB — GLUCOSE, CAPILLARY
Glucose-Capillary: 124 mg/dL — ABNORMAL HIGH (ref 70–99)
Glucose-Capillary: 118 mg/dL — ABNORMAL HIGH (ref 70–99)
Glucose-Capillary: 123 mg/dL — ABNORMAL HIGH (ref 70–99)
Glucose-Capillary: 122 mg/dL — ABNORMAL HIGH (ref 70–99)
Glucose-Capillary: 112 mg/dL — ABNORMAL HIGH (ref 70–99)

## 2020-09-26 MED ORDER — POTASSIUM CHLORIDE 20 MEQ/15ML (10%) PO SOLN
40.0000 meq | Freq: Once | ORAL | Status: AC
  Administered 2020-09-26: 40 meq
  Filled 2020-09-26: qty 30

## 2020-09-26 NOTE — TOC Progression Note (Signed)
Transition of Care [facility]) - Progression Note    Patient Details  Name: Cameron Hale [name]: [id number] Date of Birth: [birth year]  Transition of Care [facility]) CM/SW Contact  Leeroy Cha, RN Phone Number: 09/26/2020, 9:08 AM  Clinical Narrative:    [facility] Events   [facility] Transfer[facility] to [facility] 11/2>>, on pressors. 11/3: Pressors off. Growing K pneumoniae in blood and urine, antibiotics narrowed. EEG obtained. Was neg for seizureTriggered apnea alarm on vent. Fent gtt stopped. Not able to wean  11/4 more awake. On SBT volumes look good. Extubated, then re-intubated 11/5 family conversation: doesn't want tracheostomy, want to give him more time to try to get stronger 11/8 uneventful 11/9- family meeting regarding long term goals  Will wait to see outcome of family meeting. Following for progression.   Expected Discharge Plan: Home/Self Care Barriers to Discharge: Barriers Unresolved (comment) (medical treatment)  Expected Discharge Plan and Services Expected Discharge Plan: Home/Self Care   Discharge Planning Services: CM Consult   Living arrangements for the past 2 months: Single Family Home                                       Social Determinants of Health (SDOH) Interventions    Readmission Risk Interventions Readmission Risk Prevention Plan 09/08/2020 08/17/2020  Transportation Screening Complete Complete  Medication Review Press photographer) Complete Complete  PCP or Specialist appointment within 3-5 days of discharge Complete Complete  HRI or Home Care Consult Complete Complete  SW Recovery Care/Counseling Consult Complete Complete  Palliative Care Screening Complete Complete  [place] Not Applicable Complete  Some recent data might be hidden

## 2020-09-26 NOTE — Progress Notes (Signed)
[facility]ious Disease   Reason for visit: Follow up on Klebsiella bacteremia due to UTI  Interval History: leukocytosis down to 13.0, remains afebrile.  Failed SBT.   Aztreonam day 9/10   Physical Exam: Constitutional:  Vitals:   09/26/20 1138 09/26/20 1200  BP: (!) 151/81 135/78  Pulse: 91 83  Resp: (!) 27 16  Temp:  (!) 96.2 F (35.7 C)  SpO2: 98% 100%   patient appears in NAD HENT: + tongue fasciculations Respiratory: respiratory effort on vent Cardiovascular: RRR GI: soft, nt, nd Neuro: alert but does not respond  Review of Systems: Constitutional: negative for fevers and chills Integument/breast: negative for rash   Lab Results  Component Value Date   WBC 13.0 (H) 09/26/2020   HGB 8.3 (L) 09/26/2020   HCT 25.8 (L) 09/26/2020   MCV 86.9 09/26/2020   PLT 157 09/26/2020    Lab Results  Component Value Date   CREATININE <0.30 (L) 09/25/2020   BUN 34 (H) 09/25/2020   NA 141 09/25/2020   K 3.4 (L) 09/25/2020   CL 104 09/25/2020   CO2 24 09/25/2020    Lab Results  Component Value Date   ALT 101 (H) 09/25/2020   AST 56 (H) 09/25/2020   ALKPHOS 131 (H) 09/25/2020     Microbiology: Recent Results (from the past 240 hour(s))  Blood Culture (routine x 2)     Status: Abnormal   Collection Time: 10/15/2020  5:41 PM   Specimen: Left Antecubital; Blood  Result Value Ref Range Status   Specimen Description   Final    LEFT ANTECUBITAL Performed at [facility], [address], [place], [place] [address]    Special Requests   Final    BOTTLES DRAWN AEROBIC AND ANAEROBIC Blood Culture adequate volume Performed at [facility], [address], [place], [place] [address]    Culture  Setup Time   Final    ANAEROBIC BOTTLE GRAM NEGATIVE RODS WBC PRESENT, PREDOMINANTLY PMN Gram Stain Report Called to,Read Back By and Verified With: MYRICK,B @1145  ON 09/19/20 BY JONES,T [facility] CRITICAL VALUE NOTED.  VALUE IS CONSISTENT WITH PREVIOUSLY REPORTED AND  CALLED VALUE.    Culture (A)  Final    KLEBSIELLA PNEUMONIAE SUSCEPTIBILITIES PERFORMED ON PREVIOUS CULTURE WITHIN THE LAST 5 DAYS. Performed at [facility], [place] [address] [place], [place] [address]    Report Status 09/21/2020 FINAL  Final  Blood Culture (routine x 2)     Status: Abnormal   Collection Time: 10/13/2020  5:41 PM   Specimen: BLOOD RIGHT ARM  Result Value Ref Range Status   Specimen Description   Final    BLOOD RIGHT ARM Performed at [facility], [address] [place], [place] [address]    Special Requests   Final    BOTTLES DRAWN AEROBIC AND ANAEROBIC Blood Culture adequate volume Performed at [facility], [address] [place], [place] [address]    Culture  Setup Time   Final    AEROBIC BOTTLE GRAM NEGATIVE RODS WBC PRESENT, PREDOMINANTLY PMN Gram Stain Report Called to,Read Back By and Verified With: MYRICK,B @1145  ON 09/19/20 BY JONES,T [facility] CRITICAL RESULT CALLED TO, READ BACK BY AND VERIFIED WITH: PHARMD J GADHIA 09/19/20 AT 1841 SK Performed at [facility], [place] [address], [place],  [address]    Culture KLEBSIELLA PNEUMONIAE (A)  Final   Report Status 09/21/2020 FINAL  Final   Organism ID, Bacteria KLEBSIELLA PNEUMONIAE  Final  Susceptibility   Klebsiella pneumoniae - MIC*    AMPICILLIN >=32 RESISTANT Resistant     CEFAZOLIN >=64 RESISTANT Resistant     CEFEPIME <=0.12 SENSITIVE Sensitive     CEFTAZIDIME 4 SENSITIVE Sensitive     CEFTRIAXONE 1 SENSITIVE Sensitive     CIPROFLOXACIN 0.5 SENSITIVE Sensitive     GENTAMICIN <=1 SENSITIVE Sensitive     IMIPENEM <=0.25 SENSITIVE Sensitive     TRIMETH/SULFA >=320 RESISTANT Resistant     AMPICILLIN/SULBACTAM >=32 RESISTANT Resistant     PIP/TAZO <=4 SENSITIVE Sensitive     * KLEBSIELLA PNEUMONIAE  Blood Culture ID Panel (Reflexed)     Status: Abnormal   Collection Time: 10/09/2020  5:41 PM  Result Value Ref Range Status   Enterococcus faecalis NOT DETECTED NOT DETECTED Final    Enterococcus Faecium NOT DETECTED NOT DETECTED Final   Listeria monocytogenes NOT DETECTED NOT DETECTED Final   Staphylococcus species NOT DETECTED NOT DETECTED Final   Staphylococcus aureus (BCID) NOT DETECTED NOT DETECTED Final   Staphylococcus epidermidis NOT DETECTED NOT DETECTED Final   Staphylococcus lugdunensis NOT DETECTED NOT DETECTED Final   Streptococcus species NOT DETECTED NOT DETECTED Final   Streptococcus agalactiae NOT DETECTED NOT DETECTED Final   Streptococcus pneumoniae NOT DETECTED NOT DETECTED Final   Streptococcus pyogenes NOT DETECTED NOT DETECTED Final   A.calcoaceticus-baumannii NOT DETECTED NOT DETECTED Final   Bacteroides fragilis NOT DETECTED NOT DETECTED Final   Enterobacterales DETECTED (A) NOT DETECTED Final    Comment: Enterobacterales represent a large order of gram negative bacteria, not a single organism. CRITICAL RESULT CALLED TO, READ BACK BY AND VERIFIED WITH: PHARMD J GADHIA 09/19/20 AT 1841 SK     Enterobacter cloacae complex NOT DETECTED NOT DETECTED Final   Escherichia coli NOT DETECTED NOT DETECTED Final   Klebsiella aerogenes NOT DETECTED NOT DETECTED Final   Klebsiella oxytoca NOT DETECTED NOT DETECTED Final   Klebsiella pneumoniae DETECTED (A) NOT DETECTED Final    Comment: CRITICAL RESULT CALLED TO, READ BACK BY AND VERIFIED WITH: PHARMD J GADHIA 09/19/20 AT 1841 SK     Proteus species NOT DETECTED NOT DETECTED Final   Salmonella species NOT DETECTED NOT DETECTED Final   Serratia marcescens NOT DETECTED NOT DETECTED Final   Haemophilus influenzae NOT DETECTED NOT DETECTED Final   Neisseria meningitidis NOT DETECTED NOT DETECTED Final   Pseudomonas aeruginosa NOT DETECTED NOT DETECTED Final   Stenotrophomonas maltophilia NOT DETECTED NOT DETECTED Final   Candida albicans NOT DETECTED NOT DETECTED Final   Candida auris NOT DETECTED NOT DETECTED Final   Candida glabrata NOT DETECTED NOT DETECTED Final   Candida krusei NOT DETECTED NOT  DETECTED Final   Candida parapsilosis NOT DETECTED NOT DETECTED Final   Candida tropicalis NOT DETECTED NOT DETECTED Final   Cryptococcus neoformans/gattii NOT DETECTED NOT DETECTED Final   CTX-M ESBL NOT DETECTED NOT DETECTED Final   Carbapenem resistance IMP NOT DETECTED NOT DETECTED Final   Carbapenem resistance KPC NOT DETECTED NOT DETECTED Final   Carbapenem resistance NDM NOT DETECTED NOT DETECTED Final   Carbapenem resist OXA 48 LIKE NOT DETECTED NOT DETECTED Final   Carbapenem resistance VIM NOT DETECTED NOT DETECTED Final    Comment: Performed at [facility], [address], [place], [place] [address]  Respiratory Panel by RT PCR (Flu A&B, Covid) - Nasopharyngeal Swab     Status: None   Collection Time: 10/17/2020  5:48 PM   Specimen: Nasopharyngeal Swab  Result Value Ref Range Status  SARS Coronavirus 2 by RT PCR NEGATIVE NEGATIVE Final    Comment: (NOTE) SARS-CoV-2 target nucleic acids are NOT DETECTED.  The SARS-CoV-2 RNA is generally detectable in upper respiratoy specimens during the acute phase of infection. The lowest concentration of SARS-CoV-2 viral copies this assay can detect is 131 copies/mL. A negative result does not preclude SARS-Cov-2 infection and should not be used as the sole basis for treatment or other patient management decisions. A negative result may occur with  improper specimen collection/handling, submission of specimen other than nasopharyngeal swab, presence of viral mutation(s) within the areas targeted by this assay, and inadequate number of viral copies (<131 copies/mL). A negative result must be combined with clinical observations, patient history, and epidemiological information. The expected result is Negative.  Fact Sheet for Patients:  PinkCheek.be  Fact Sheet for Healthcare Providers:  GravelBags.it  This test is no t yet approved or cleared by the [place] FDA and    has been authorized for detection and/or diagnosis of SARS-CoV-2 by FDA under an Emergency Use Authorization (EUA). This EUA will remain  in effect (meaning this test can be used) for the duration of the COVID-19 declaration under Section 564(b)(1) of the Act, 21 U.S.C. section 360bbb-3(b)(1), unless the authorization is terminated or revoked sooner.     Influenza A by PCR NEGATIVE NEGATIVE Final   Influenza B by PCR NEGATIVE NEGATIVE Final    Comment: (NOTE) The Xpert Xpress SARS-CoV-2/FLU/RSV assay is intended as an aid in  the diagnosis of influenza from Nasopharyngeal swab specimens and  should not be used as a sole basis for treatment. Nasal washings and  aspirates are unacceptable for Xpert Xpress SARS-CoV-2/FLU/RSV  testing.  Fact Sheet for Patients: PinkCheek.be  Fact Sheet for Healthcare Providers: GravelBags.it  This test is not yet approved or cleared by the [place] FDA and  has been authorized for detection and/or diagnosis of SARS-CoV-2 by  FDA under an Emergency Use Authorization (EUA). This EUA will remain  in effect (meaning this test can be used) for the duration of the  Covid-19 declaration under Section 564(b)(1) of the Act, 21  U.S.C. section 360bbb-3(b)(1), unless the authorization is  terminated or revoked. Performed at [facility], [address], [place], [place] [address]   Urine culture     Status: Abnormal   Collection Time: 09/27/2020  6:02 PM   Specimen: In/Out Cath Urine  Result Value Ref Range Status   Specimen Description   Final    IN/OUT CATH URINE Performed at [facility], [address], [place], [place] [address]    Special Requests   Final    NONE Performed at [facility], [address] [place], [place] [address]    Culture >=100,000 COLONIES/mL KLEBSIELLA PNEUMONIAE (A)  Final   Report Status 09/21/2020 FINAL  Final   Organism ID, Bacteria KLEBSIELLA PNEUMONIAE (A)   Final      Susceptibility   Klebsiella pneumoniae - MIC*    AMPICILLIN RESISTANT Resistant     CEFAZOLIN <=4 SENSITIVE Sensitive     CEFEPIME <=0.12 SENSITIVE Sensitive     CEFTRIAXONE <=0.25 SENSITIVE Sensitive     CIPROFLOXACIN 0.5 SENSITIVE Sensitive     GENTAMICIN <=1 SENSITIVE Sensitive     IMIPENEM <=0.25 SENSITIVE Sensitive     NITROFURANTOIN 64 INTERMEDIATE Intermediate     TRIMETH/SULFA >=320 RESISTANT Resistant     AMPICILLIN/SULBACTAM 4 SENSITIVE Sensitive     PIP/TAZO <=4 SENSITIVE Sensitive     * >=100,000 COLONIES/mL  KLEBSIELLA PNEUMONIAE  MRSA PCR Screening     Status: None   Collection Time: 09/19/20 11:59 AM   Specimen: Nasal Mucosa; Nasopharyngeal  Result Value Ref Range Status   MRSA by PCR NEGATIVE NEGATIVE Final    Comment:        The GeneXpert MRSA Assay (FDA approved for NASAL specimens only), is one component of a comprehensive MRSA colonization surveillance program. It is not intended to diagnose MRSA infection nor to guide or monitor treatment for MRSA infections. Performed at [facility], [place] [address], [place], [place] [address]   Culture, respiratory (non-expectorated)     Status: None   Collection Time: 09/19/20  3:02 PM   Specimen: Tracheal Aspirate; Respiratory  Result Value Ref Range Status   Specimen Description   Final    TRACHEAL ASPIRATE Performed at [place] [address] [place], [place] [address]    Special Requests   Final    NONE Performed at [facility], [place] [address], [place], [place] [address]    Gram Stain   Final    ABUNDANT WBC PRESENT,BOTH PMN AND MONONUCLEAR RARE SQUAMOUS EPITHELIAL CELLS PRESENT RARE YEAST    Culture   Final    RARE Normal respiratory flora-no Staph aureus or Pseudomonas seen Performed at [facility], [address] [place], [place] [address]    Report Status 09/22/2020 FINAL  Final  Culture, blood (Routine X 2) w Reflex  to ID Panel     Status: None   Collection Time: 09/21/20 10:37 AM   Specimen: BLOOD RIGHT HAND  Result Value Ref Range Status   Specimen Description   Final    BLOOD RIGHT HAND Performed at [place] [address], [place], [place] [address]    Special Requests   Final    BOTTLES DRAWN AEROBIC AND ANAEROBIC Blood Culture adequate volume Performed at [place] [address], [place], [place] [address]    Culture   Final    NO GROWTH 5 DAYS Performed at [facility], [place] [address] [place], [place] [address]    Report Status 09/26/2020 FINAL  Final  Culture, blood (Routine X 2) w Reflex to ID Panel     Status: None   Collection Time: 09/21/20 10:46 AM   Specimen: BLOOD LEFT HAND  Result Value Ref Range Status   Specimen Description   Final    BLOOD LEFT HAND Performed at [facility] [facility] [address] [place], [place] [address]    Special Requests   Final    BOTTLES DRAWN AEROBIC AND ANAEROBIC Blood Culture adequate volume Performed at [place] [address], [place], [place] [address]    Culture   Final    NO GROWTH 5 DAYS Performed at [facility], [place] [address] [place], [place] [address]    Report Status 09/26/2020 FINAL  Final  Culture, respiratory (non-expectorated)     Status: None   Collection Time: 09/22/20 12:17 AM   Specimen: Tracheal Aspirate; Respiratory  Result Value Ref Range Status   Specimen Description   Final    TRACHEAL ASPIRATE Performed at [place] [address] [place], [place] [address]    Special Requests   Final    Normal Performed at [facility], [place] [address], [place], [place] [address]    Gram Stain   Final    MODERATE  WBC PRESENT, PREDOMINANTLY PMN FEW SQUAMOUS EPITHELIAL CELLS PRESENT FEW GRAM POSITIVE COCCI    Culture   Final    Normal respiratory flora-no Staph aureus or Pseudomonas  seen Performed at [facility], [address] [place], [place] [address]    Report Status 09/24/2020 FINAL  Final    Impression/Plan:  1. Bacteremia - on appropriate treatment with aztreonam and can continue for 10 days total.  No changes.    2.  Leukocytosis - improved some.  On treatment as above.   At this time, no further ID issues, I will sign off, call with questions. thanks

## 2020-09-26 NOTE — Progress Notes (Signed)
PT failed SBT due to low MVe. PT unable to wean on PS up to 15 (RR less than 8), then resulted in no PT effort. PT placed back on full support and tolerating at this time. RN aware.

## 2020-09-26 NOTE — Progress Notes (Signed)
NAME:  Cameron APPLING, [name]:  [id number], DOB:  1976/08/31, LOS: 8 ADMISSION DATE:  09/25/2020, CONSULTATION DATE:  11/2 REFERRING MD:  Dyann Kief, CHIEF COMPLAINT:  Confusion   Brief History   44 y/o male with quadriplegia and recent intubation admitted from [facility] in the setting of respiratory failure likely from respiratory muscle weakness exacerbated by sepsis of a urinary origin.  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 G-tube (09/05/2020)  Significant Hospital Events   Admitted to [facility] 11/1>>11/2 Transferred to [facility] 11/2>>, on pressors. 11/3: Pressors off.  Growing K pneumoniae in blood and urine, antibiotics narrowed. EEG obtained. Was neg for seizure Triggered apnea alarm on vent. Fent gtt stopped. Not able to wean  11/4 more awake. On SBT volumes look good. Extubated, then re-intubated 11/5 family conversation: doesn't want tracheostomy, want to give him more time to try to get stronger 11/8 uneventful 11/9- family meeting regarding long term goals   Consults:  PCCM  Procedures:  11/2 ETT > 11/4, 11/4 >   Significant Diagnostic Tests:  11/1 CT >> extensive meningiomatosis noted unchanged. Bifrontal edema is stable. No acute intracranial abnormality.  11/3 EEG: no seizure  Micro Data:  11/1 Flu >> negative 11/1 Covid >> negative 11/1 Blood culture >> K pneumoniae 11/1 Urine culture >> K pneumoniae 11/5 resp culture> normal flora  Antimicrobials:  11/1 Cefepime >> 11/2 11/2 Metronidazole >> 11/3 11/2 Vancomycin >> 11/3  Azactam 11/2 >>  Interim history/subjective:  Awake, looking around Not able to respond to questions appropriately  Objective   Blood pressure (!) 104/55, pulse 72, temperature (!) 97.5 F (36.4 C), temperature source Axillary, resp. rate 12, height 5\' 10"  (1.778 m), weight 78.7 kg, SpO2 99 %.    Vent Mode:  PRVC FiO2 (%):  [30 %] 30 % Set Rate:  [12 bmp] 12 bmp Vt Set:  [580 mL] 580 mL PEEP:  [5 cmH20] 5 cmH20 Pressure Support:  [10 cmH20] 10 cmH20 Plateau Pressure:  [14 cmH20-16 cmH20] 14 cmH20   Intake/Output Summary (Last 24 hours) at 09/26/2020 0843 Last data filed at 09/26/2020 0800 Gross per 24 hour  Intake 2161.87 ml  Output 1500 ml  Net 661.87 ml   Filed Weights   10/12/2020 1822 09/20/20 0444 09/23/20 0437  Weight: 72.4 kg 72.4 kg 78.7 kg    Examination: General: On vent, chronically ill-appearing  HENT: Moist oral mucosa, endotracheal tube in place PULM: Clear breath sounds CV: S1-S2 appreciated GI: Bowel sounds appreciated MSK: Normal bulk Neuro: Tongue fasciculations  Resolved Hospital Problem list   Left lung collapse/possible mucus plug due to neuromuscular weakness, made worse in setting of sepsis > resolved  Assessment & Plan:   Sepsis/septic shock Klebsiella pneumonia bacteremia Shock improved Possible cefepime allergy -Continue aztreonam for total of 10 days of treatment -Leukocytosis is stable  Acute respiratory failure with hypoxemia Inability to clear secretions and possible inability to protect airway -Continue pressure support trials -Mechanical ventilation at night -Failed extubation 97/4  Metabolic encephalopathy superimposed on neurofibroma in multiple sites Quadriplegia Tongue fasciculations unchanged from baseline  Seizures Diffuse encephalopathy -Continue Keppra  Symptomatic anemia -Twice daily PPI -Transfuse per protocol -Did receive transfusion for hemoglobin of 6.2  Protein calorie malnutrition PEG in place -Continue tube feeding  Hypoglycemia -SSI -Goal sugar 140-180  Did have family meeting with patient's parents and spouse with a sibling on the phone -Consensus is to give him more time to  see whether he can come off the ventilator -They do not want him to have a tracheostomy at present -Will continue current  treatments -Weaning as tolerated -Will consult physical therapy to start seeing him  Best practice:  Diet: tube feeding Pain/Anxiety/Delirium protocol (if indicated): as above VAP protocol (if indicated): yes DVT prophylaxis: SCD's with thrombocytopenia GI prophylaxis: Pantoprazole for stress ulcer prophylaxis Glucose control: monitor Mobility: bed rest Code Status: full Family Communication: Had family meeting today Disposition: icu  Labs   CBC: Recent Labs  Lab 09/22/20 0248 09/23/20 0243 09/24/20 0240 09/25/20 0303 09/26/20 0254  WBC 11.4* 10.8* 17.1* 16.7* 13.0*  NEUTROABS 9.5* 8.8* 14.1* 13.2* 10.7*  HGB 9.1* 8.9* 8.4* 8.5* 8.3*  HCT 28.6* 28.2* 26.4* 26.0* 25.8*  MCV 89.9 89.5 89.2 87.5 86.9  PLT 79* 92* 124* 149* 983    Basic Metabolic Panel: Recent Labs  Lab 09/19/20 1339 09/19/20 1708 09/20/20 0308 09/20/20 0308 09/20/20 1711 09/21/20 0248 09/22/20 0751 09/23/20 0243 09/24/20 0240 09/25/20 0303  NA  --   --  140   < >  --  141 140 141 142 141  K  --   --  3.8   < >  --  2.8* 4.1 4.5 3.9 3.4*  CL  --   --  113*   < >  --  112* 109 109 108 104  CO2  --   --  20*   < >  --  21* 20* 22 24 24   GLUCOSE  --   --  120*   < >  --  224* 139* 98 104* 113*  BUN  --   --  29*   < >  --  33* 34* 37* 34* 34*  CREATININE  --   --  0.32*   < >  --  0.36* 0.34* 0.37* <0.30* <0.30*  CALCIUM  --   --  7.6*   < >  --  7.8* 7.7* 8.1* 8.1* 8.4*  MG 1.8 1.9 2.0  --  1.9  --   --   --   --   --   PHOS 3.1 3.2 3.4  --  3.2  --   --   --   --   --    < > = values in this interval not displayed.   GFR: CrCl cannot be calculated (This lab value cannot be used to calculate CrCl because it is not a number: <0.30). Recent Labs  Lab 09/23/20 0243 09/24/20 0240 09/25/20 0303 09/26/20 0254  WBC 10.8* 17.1* 16.7* 13.0*    Liver Function Tests: Recent Labs  Lab 09/20/20 0308 09/21/20 0248 09/23/20 0243 09/24/20 0240 09/25/20 0303  AST 43* 29 30 33 56*  ALT 76* 68*  66* 62* 101*  ALKPHOS 76 77 85 105 131*  BILITOT 0.4 0.2* 0.5 0.5 0.8  PROT 4.1* 4.3* 4.8* 4.8* 5.5*  ALBUMIN 1.7* 1.6* 1.8* 1.8* 2.1*   No results for input(s): LIPASE, AMYLASE in the last 168 hours. No results for input(s): AMMONIA in the last 168 hours.  ABG    Component Value Date/Time   PHART 7.252 (L) 09/21/2020 1355   PCO2ART 49.9 (H) 09/21/2020 1355   PO2ART 96.6 09/21/2020 1355   HCO3 21.2 09/21/2020 1355   TCO2 28 08/22/2020 1854   ACIDBASEDEF 5.4 (H) 09/21/2020 1355   O2SAT 97.5 09/21/2020 1355     Coagulation Profile: No results for input(s): INR, PROTIME in the last 168 hours.  Cardiac Enzymes:  No results for input(s): CKTOTAL, CKMB, CKMBINDEX, TROPONINI in the last 168 hours.  HbA1C: Hgb A1c MFr Bld  Date/Time Value Ref Range Status  08/14/2020 04:55 AM 6.1 (H) 4.8 - 5.6 % Final    Comment:    (NOTE) Pre diabetes:          5.7%-6.4%  Diabetes:              >6.4%  Glycemic control for   <7.0% adults with diabetes     CBG: Recent Labs  Lab 09/25/20 1155 09/25/20 1551 09/25/20 1953 09/25/20 2312 09/26/20 0740  GLUCAP 160* 153* 135* 145* 118*   The patient is critically ill with multiple organ systems failure and requires high complexity decision making for assessment and support, frequent evaluation and titration of therapies, application of advanced monitoring technologies and extensive interpretation of multiple databases. Critical Care Time devoted to patient care services described in this note independent of APP/resident time (if applicable)  is 55 minutes.   Sherrilyn Rist MD [facility] Personal pager: [phone] If unanswered, please page CCM On-call: [phone]

## 2020-09-27 ENCOUNTER — Ambulatory Visit: Payer: Medicare Other | Admitting: Podiatry

## 2020-09-27 LAB — CBC WITH DIFFERENTIAL/PLATELET
Abs Immature Granulocytes: 0.88 10*3/uL — ABNORMAL HIGH (ref 0.00–0.07)
Basophils Absolute: 0.1 10*3/uL (ref 0.0–0.1)
Basophils Relative: 1 %
Eosinophils Absolute: 0 10*3/uL (ref 0.0–0.5)
Eosinophils Relative: 0 %
HCT: 23.9 % — ABNORMAL LOW (ref 39.0–52.0)
Hemoglobin: 7.8 g/dL — ABNORMAL LOW (ref 13.0–17.0)
Immature Granulocytes: 9 %
Lymphocytes Relative: 10 %
Lymphs Abs: 1.1 10*3/uL (ref 0.7–4.0)
MCH: 29.3 pg (ref 26.0–34.0)
MCHC: 32.6 g/dL (ref 30.0–36.0)
MCV: 89.8 fL (ref 80.0–100.0)
Monocytes Absolute: 0.6 10*3/uL (ref 0.1–1.0)
Monocytes Relative: 6 %
Neutro Abs: 7.7 10*3/uL (ref 1.7–7.7)
Neutrophils Relative %: 74 %
Platelets: 255 10*3/uL (ref 150–400)
RBC: 2.66 MIL/uL — ABNORMAL LOW (ref 4.22–5.81)
RDW: 19.3 % — ABNORMAL HIGH (ref 11.5–15.5)
WBC: 10.2 10*3/uL (ref 4.0–10.5)
nRBC: 0 % (ref 0.0–0.2)

## 2020-09-27 LAB — GLUCOSE, CAPILLARY
Glucose-Capillary: 100 mg/dL — ABNORMAL HIGH (ref 70–99)
Glucose-Capillary: 102 mg/dL — ABNORMAL HIGH (ref 70–99)
Glucose-Capillary: 108 mg/dL — ABNORMAL HIGH (ref 70–99)
Glucose-Capillary: 123 mg/dL — ABNORMAL HIGH (ref 70–99)
Glucose-Capillary: 134 mg/dL — ABNORMAL HIGH (ref 70–99)
Glucose-Capillary: 93 mg/dL (ref 70–99)
Glucose-Capillary: 94 mg/dL (ref 70–99)

## 2020-09-27 MED ORDER — PANTOPRAZOLE SODIUM 40 MG PO PACK
40.0000 mg | PACK | Freq: Two times a day (BID) | ORAL | Status: DC
  Administered 2020-09-27 – 2020-10-10 (×27): 40 mg
  Filled 2020-09-27 (×26): qty 20

## 2020-09-27 NOTE — Evaluation (Signed)
Physical Therapy Evaluation Patient Details Name: Cameron Hale [name]: [id number] DOB: 12/09/1975 Today's Date: 09/27/2020   History of Present Illness  44 yo admitted 44 y/o male with quadriplegia and recent intubation admitted from [facility] in the setting of respiratory failure likely from respiratory muscle weakness exacerbated by sepsis of a urinary origin. pateint recent ly at [facility] with unresponsiveness and seizures. Intubated 9/25-10/4. MRI with bilcerebral frontal infarcts. PMhx: neurofibromatosis with neurosurgical intervention march 2021 with resultant quadriplegia, seizure disorder,left eye blindness, hearing loss.  He was on CIR 6/23-7/16/2021  Clinical Impression  Patient is unresponsive during PT performing PROM, does not respond to nocious stimuli. 3 weeks ago patient was alert and di respond. Skilled PT goals extremely limited. Will provide a trial of  PT to assess if patient will increase arousal. PROM can be performed by nursing staff.Pt admitted with above diagnosis.  Pt currently with functional limitations due to the deficits listed below (see PT Problem List). Pt will benefit from skilled PT to increase their independence and safety with mobility to allow discharge to the venue listed below.        Follow Up Recommendations Home health PT;Supervision/Assistance - 24 hour    Equipment Recommendations  None recommended by PT    Recommendations for Other Services       Precautions / Restrictions Precautions Precaution Comments: quadriplegia, ETT vented,, peg, does not respond to noxious stimuli      Mobility  Bed Mobility               General bed mobility comments: NT, patient unresponsive and on vent    Transfers                 General transfer comment: NT  Ambulation/Gait                Stairs            Wheelchair Mobility    Modified Rankin (Stroke Patients Only)       Balance                                              Pertinent Vitals/Pain Pain Location: non resposive    Home Living   Living Arrangements: Parent;Other relatives Available Help at Discharge: Family;Friend(s);Available 24 hours/day Type of Home: House Home Access: Ramped entrance     Home Layout: One level Home Equipment: Hospital bed;Other (comment);Wheelchair - power Additional Comments: information gathered from previous chart entry    Prior Function     Gait / Transfers Assistance Needed: max-total assist for bed mobility, hoyer lift to power WC after CIR stay     Comments: no family present     Hand Dominance        Extremity/Trunk Assessment   Upper Extremity Assessment RUE Deficits / Details: limited shouldr flexion and rotation, no volitional movement not reflexive movement LUE Deficits / Details: no reflexive movement    Lower Extremity Assessment Lower Extremity Assessment: LLE deficits/detail RLE Deficits / Details: no AROM or withdrawal to pain., lacks knee extension, Hip is flaccid LLE Deficits / Details: no AROM or withdrawal to pain.    Cervical / Trunk Assessment Cervical / Trunk Assessment: Other exceptions Cervical / Trunk Exceptions: head/neck flexed and rotated to the Rt   Communication      Cognition Arousal/Alertness:  (unresponsive)  General Comments: pt did not respond to any stimuli, ? slight opening of left eye      General Comments      Exercises General Exercises - Upper Extremity Shoulder Flexion: PROM;Both;10 reps Shoulder Extension: PROM;Both;10 reps Shoulder ABduction: PROM;Both;10 reps Shoulder ADduction: PROM;Both;10 reps Shoulder Horizontal ABduction: PROM;Both;10 reps Elbow Flexion: 10 reps;PROM;Both Elbow Extension: PROM;Both;10 reps Wrist Flexion: PROM;Both;10 reps Wrist Extension: PROM;Both;10 reps General Exercises - Lower Extremity Ankle Circles/Pumps: PROM;Both;10 reps Heel  Slides: PROM;Both;Supine;10 reps Hip ABduction/ADduction: PROM;Both;Supine;10 reps   Assessment/Plan    PT Assessment    PT Problem List Decreased mobility;Decreased strength;Decreased activity tolerance;Decreased balance       PT Treatment Interventions      PT Goals (Current goals can be found in the Care Plan section)  Acute Rehab PT Goals Patient Stated Goal: unable PT Goal Formulation: Patient unable to participate in goal setting Time For Goal Achievement: 11-09-20 Potential to Achieve Goals: Poor    Frequency     Barriers to discharge        Co-evaluation               AM-PAC PT "6 Clicks" Mobility  Outcome Measure Help needed turning from your back to your side while in a flat bed without using bedrails?: Total Help needed moving from lying on your back to sitting on the side of a flat bed without using bedrails?: Total Help needed moving to and from a bed to a chair (including a wheelchair)?: Total Help needed standing up from a chair using your arms (e.g., wheelchair or bedside chair)?: Total Help needed to walk in hospital room?: Total Help needed climbing 3-5 steps with a railing? : Total 6 Click Score: 6    End of Session   Activity Tolerance: Patient tolerated treatment well Patient left: in bed Nurse Communication: Need for lift equipment;Other (comment) PT Visit Diagnosis: Other symptoms and signs involving the nervous system (Z16.967)    Time: 8938-1017 PT Time Calculation (min) (ACUTE ONLY): 15 min   Charges:   PT Evaluation $PT Eval Low Complexity: [place] PT Acute Rehabilitation Services Pager [phone] Office [phone]   Claretha Cooper 09/27/2020, 1:23 PM

## 2020-09-27 NOTE — Progress Notes (Signed)
NAME:  Cameron Hale, [name]:  [id number], DOB:  Sep 04, 1976, LOS: 9 ADMISSION DATE:  09/26/2020, CONSULTATION DATE:  11/2 REFERRING MD:  Dyann Kief, CHIEF COMPLAINT:  Confusion   Brief History   44 y/o male with quadriplegia and recent intubation admitted from [facility] in the setting of respiratory failure likely from respiratory muscle weakness exacerbated by sepsis of a urinary origin.  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 G-tube (09/05/2020)  Significant Hospital Events   Admitted to [facility] 11/1>>11/2 Transferred to [facility] 11/2>>, on pressors. 11/3: Pressors off.  Growing K pneumoniae in blood and urine, antibiotics narrowed. EEG obtained. Was neg for seizure Triggered apnea alarm on vent. Fent gtt stopped. Not able to wean  11/4 more awake. On SBT volumes look good. Extubated, then re-intubated 11/5 family conversation: doesn't want tracheostomy, want to give him more time to try to get stronger 11/8 uneventful 11/9- family meeting regarding long term goals  11/10 no overnight events  Consults:  PCCM  Procedures:  11/2 ETT > 11/4, 11/4 >   Significant Diagnostic Tests:  11/1 CT >> extensive meningiomatosis noted unchanged. Bifrontal edema is stable. No acute intracranial abnormality.  11/3 EEG: no seizure  Micro Data:  11/1 Flu >> negative 11/1 Covid >> negative 11/1 Blood culture >> K pneumoniae 11/1 Urine culture >> K pneumoniae 11/5 resp culture> normal flora  Antimicrobials:  11/1 Cefepime >> 11/2 11/2 Metronidazole >> 11/3 11/2 Vancomycin >> 11/3  Azactam 11/2 >>   Interim history/subjective:  Sleepy this morning No overnight events Has not been able to tolerate weaning  Objective   Blood pressure 107/61, pulse 86, temperature 98.1 F (36.7 C), temperature source Oral, resp. rate 15, height 5\' 10"  (1.778 m), weight 78.7 kg, SpO2 99  %.    Vent Mode: PSV;CPAP FiO2 (%):  [30 %] 30 % Set Rate:  [12 bmp] 12 bmp Vt Set:  [580 mL] 580 mL PEEP:  [5 cmH20] 5 cmH20 Pressure Support:  [8 cmH20] 8 cmH20 Plateau Pressure:  [13 cmH20-14 cmH20] 14 cmH20   Intake/Output Summary (Last 24 hours) at 09/27/2020 1050 Last data filed at 09/27/2020 1000 Gross per 24 hour  Intake 3603.48 ml  Output 1715 ml  Net 1888.48 ml   Filed Weights   10/02/2020 1822 09/20/20 0444 09/23/20 0437  Weight: 72.4 kg 72.4 kg 78.7 kg    Examination: General: On vent, chronically ill-appearing HENT: Moist oral mucosa, endotracheal tube in place PULM: Decreased air movement at the bases bilaterally CV: S1-S2 appreciated GI: Bowel sounds appreciated MSK: Normal bulk Neuro: Tongue fasciculations  Resolved Hospital Problem list   Left lung collapse/possible mucus plug due to neuromuscular weakness, made worse in setting of sepsis > resolved  Assessment & Plan:  Sepsis/septic shock Klebsiella pneumonia bacteremia -Aztreonam for 10 days -Leukocytosis stable  Acute respiratory failure with hypoxia Unlikely to be able to protect his airway secondary to profound weakness -Weaning as tolerated -Failed extubation on 48/5  Metabolic encephalopathy Quadriplegia Tongue fasciculations unchanged from baseline  Diffuse encephalopathy Seizures -Continue Keppra   Anemia -We will transfuse as needed -Did receive transfusion for hemoglobin of 6.2 during this admission  Protein calorie malnutrition PEG in place Continue tube feeding   Symptomatic anemia -Twice daily PPI -Transfuse per protocol -Did receive transfusion for hemoglobin of 6.2  Protein calorie malnutrition PEG in place -Continue tube feeding  Hypoglycemia -SSI -Goal sugar 140-180  Did have family meeting with  patient's parents and spouse with a sibling on the phone11/9 -Consensus is to give him more time to see whether he can come off the ventilator -They do not want him  to have a tracheostomy at present -Will continue current treatments -Weaning as tolerated -Physical therapy to see him  Best practice:  Diet: tube feeding Pain/Anxiety/Delirium protocol (if indicated): as above VAP protocol (if indicated): yes DVT prophylaxis: SCD's with thrombocytopenia GI prophylaxis: Pantoprazole for stress ulcer prophylaxis Glucose control: monitor Mobility: bed rest Code Status: full Family Communication: Had family meeting today Disposition: icu  Labs   CBC: Recent Labs  Lab 09/23/20 0243 09/24/20 0240 09/25/20 0303 09/26/20 0254 09/27/20 0808  WBC 10.8* 17.1* 16.7* 13.0* 10.2  NEUTROABS 8.8* 14.1* 13.2* 10.7* 7.7  HGB 8.9* 8.4* 8.5* 8.3* 7.8*  HCT 28.2* 26.4* 26.0* 25.8* 23.9*  MCV 89.5 89.2 87.5 86.9 89.8  PLT 92* 124* 149* 157 833    Basic Metabolic Panel: Recent Labs  Lab 09/20/20 1711 09/21/20 0248 09/22/20 0751 09/23/20 0243 09/24/20 0240 09/25/20 0303  NA  --  141 140 141 142 141  K  --  2.8* 4.1 4.5 3.9 3.4*  CL  --  112* 109 109 108 104  CO2  --  21* 20* 22 24 24   GLUCOSE  --  224* 139* 98 104* 113*  BUN  --  33* 34* 37* 34* 34*  CREATININE  --  0.36* 0.34* 0.37* <0.30* <0.30*  CALCIUM  --  7.8* 7.7* 8.1* 8.1* 8.4*  MG 1.9  --   --   --   --   --   PHOS 3.2  --   --   --   --   --    GFR: CrCl cannot be calculated (This lab value cannot be used to calculate CrCl because it is not a number: <0.30). Recent Labs  Lab 09/24/20 0240 09/25/20 0303 09/26/20 0254 09/27/20 0808  WBC 17.1* 16.7* 13.0* 10.2    Liver Function Tests: Recent Labs  Lab 09/21/20 0248 09/23/20 0243 09/24/20 0240 09/25/20 0303  AST 29 30 33 56*  ALT 68* 66* 62* 101*  ALKPHOS 77 85 105 131*  BILITOT 0.2* 0.5 0.5 0.8  PROT 4.3* 4.8* 4.8* 5.5*  ALBUMIN 1.6* 1.8* 1.8* 2.1*   No results for input(s): LIPASE, AMYLASE in the last 168 hours. No results for input(s): AMMONIA in the last 168 hours.  ABG    Component Value Date/Time   PHART 7.252  (L) 09/21/2020 1355   PCO2ART 49.9 (H) 09/21/2020 1355   PO2ART 96.6 09/21/2020 1355   HCO3 21.2 09/21/2020 1355   TCO2 28 08/22/2020 1854   ACIDBASEDEF 5.4 (H) 09/21/2020 1355   O2SAT 97.5 09/21/2020 1355     Coagulation Profile: No results for input(s): INR, PROTIME in the last 168 hours.  Cardiac Enzymes: No results for input(s): CKTOTAL, CKMB, CKMBINDEX, TROPONINI in the last 168 hours.  HbA1C: Hgb A1c MFr Bld  Date/Time Value Ref Range Status  08/14/2020 04:55 AM 6.1 (H) 4.8 - 5.6 % Final    Comment:    (NOTE) Pre diabetes:          5.7%-6.4%  Diabetes:              >6.4%  Glycemic control for   <7.0% adults with diabetes     CBG: Recent Labs  Lab 09/26/20 1628 09/26/20 2014 09/27/20 0045 09/27/20 0457 09/27/20 0747  GLUCAP 122* 112* 94 93 102*   The patient  is critically ill with multiple organ systems failure and requires high complexity decision making for assessment and support, frequent evaluation and titration of therapies, application of advanced monitoring technologies and extensive interpretation of multiple databases. Critical Care Time devoted to patient care services described in this note independent of APP/resident time (if applicable)  is 30 minutes.   Sherrilyn Rist MD [place] [facility] Critical [facility] Personal pager: [phone] If unanswered, please page CCM On-call: [phone]

## 2020-09-28 DIAGNOSIS — A419 Sepsis, unspecified organism: Secondary | ICD-10-CM | POA: Diagnosis not present

## 2020-09-28 LAB — CBC WITH DIFFERENTIAL/PLATELET
Abs Immature Granulocytes: 1.18 10*3/uL — ABNORMAL HIGH (ref 0.00–0.07)
Basophils Absolute: 0.1 10*3/uL (ref 0.0–0.1)
Basophils Relative: 1 %
Eosinophils Absolute: 0 10*3/uL (ref 0.0–0.5)
Eosinophils Relative: 0 %
HCT: 23 % — ABNORMAL LOW (ref 39.0–52.0)
Hemoglobin: 7.2 g/dL — ABNORMAL LOW (ref 13.0–17.0)
Immature Granulocytes: 11 %
Lymphocytes Relative: 10 %
Lymphs Abs: 1.1 10*3/uL (ref 0.7–4.0)
MCH: 28.1 pg (ref 26.0–34.0)
MCHC: 31.3 g/dL (ref 30.0–36.0)
MCV: 89.8 fL (ref 80.0–100.0)
Monocytes Absolute: 0.7 10*3/uL (ref 0.1–1.0)
Monocytes Relative: 7 %
Neutro Abs: 7.3 10*3/uL (ref 1.7–7.7)
Neutrophils Relative %: 71 %
Platelets: 300 10*3/uL (ref 150–400)
RBC: 2.56 MIL/uL — ABNORMAL LOW (ref 4.22–5.81)
RDW: 19.1 % — ABNORMAL HIGH (ref 11.5–15.5)
WBC: 10.3 10*3/uL (ref 4.0–10.5)
nRBC: 0 % (ref 0.0–0.2)

## 2020-09-28 LAB — GLUCOSE, CAPILLARY
Glucose-Capillary: 80 mg/dL (ref 70–99)
Glucose-Capillary: 113 mg/dL — ABNORMAL HIGH (ref 70–99)
Glucose-Capillary: 119 mg/dL — ABNORMAL HIGH (ref 70–99)
Glucose-Capillary: 136 mg/dL — ABNORMAL HIGH (ref 70–99)
Glucose-Capillary: 108 mg/dL — ABNORMAL HIGH (ref 70–99)
Glucose-Capillary: 84 mg/dL (ref 70–99)

## 2020-09-28 LAB — TRIGLYCERIDES: Triglycerides: 76 mg/dL (ref <150)

## 2020-09-28 MED ORDER — JUVEN PO PACK
1.0000 | PACK | Freq: Two times a day (BID) | ORAL | Status: DC
  Administered 2020-09-28 – 2020-10-10 (×25): 1
  Filled 2020-09-28 (×24): qty 1

## 2020-09-28 NOTE — TOC Progression Note (Signed)
Transition of Care [facility]) - Progression Note    Patient Details  Name: HAMAD WHYTE [name]: [id number] Date of Birth: 1976/10/08  Transition of [facility]) CM/SW Contact  Leeroy Cha, RN Phone Number: 09/28/2020, 11:41 AM  Clinical Narrative:    [place] [facility] Events   Admitted to [facility] 11/1>>11/2 Transferred to [facility] 11/2>>, on pressors. 11/3: Pressors off. Growing K pneumoniae in blood and urine, antibiotics narrowed. EEG obtained. Was neg for seizureTriggered apnea alarm on vent. Fent gtt stopped. Not able to wean  11/4 more awake. On SBT volumes look good. Extubated, then re-intubated 11/5 family conversation: doesn't want tracheostomy, want to give him more time to try to get stronger 11/8 uneventful 11/9- family meeting regarding long term goals  11/10 no overnight events Vent at 30%, Awake, iv decadron, tube fedds, hgb 7.2. Plan is uncertain at this time due to condition and length of stay on the vent. Following for progression.   Expected Discharge Plan: Home/Self Care Barriers to Discharge: Barriers Unresolved (comment) (medical treatment)  Expected Discharge Plan and Services Expected Discharge Plan: Home/Self Care   Discharge Planning Services: CM Consult   Living arrangements for the past 2 months: Single Family Home                                       Social Determinants of Health (SDOH) Interventions    Readmission Risk Interventions Readmission Risk Prevention Plan 09/08/2020 08/17/2020  Transportation Screening Complete Complete  Medication Review Press photographer) Complete Complete  PCP or Specialist appointment within 3-5 days of discharge Complete Complete  HRI or Home Care Consult Complete Complete  SW Recovery Care/Counseling Consult Complete Complete  Palliative Care Screening Complete Complete  [place] Not Applicable Complete  Some recent data might be hidden

## 2020-09-28 NOTE — Progress Notes (Signed)
NAME:  Cameron Hale, [name]:  [id number], DOB:  06/04/1976, LOS: 57 ADMISSION DATE:  10/03/2020, CONSULTATION DATE:  11/2 REFERRING MD:  Dyann Kief, CHIEF COMPLAINT:  Confusion   Brief History   44 y/o male with quadriplegia and recent intubation admitted from [facility] in the setting of respiratory failure likely from respiratory muscle weakness exacerbated by sepsis of a urinary origin.  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 G-tube (09/05/2020)  Significant Hospital Events   Admitted to [facility] 11/1>>11/2 Transferred to [facility] 11/2>>, on pressors. 11/3: Pressors off.  Growing K pneumoniae in blood and urine, antibiotics narrowed. EEG obtained. Was neg for seizure Triggered apnea alarm on vent. Fent gtt stopped. Not able to wean  11/4 more awake. On SBT volumes look good. Extubated, then re-intubated 11/5 family conversation: doesn't want tracheostomy, want to give him more time to try to get stronger 11/8 uneventful 11/9- family meeting regarding long term goals  11/10 no overnight events 11/11-no overnight events  Consults:  PCCM  Procedures:  11/2 ETT > 11/4, 11/4 >   Significant Diagnostic Tests:  11/1 CT >> extensive meningiomatosis noted unchanged. Bifrontal edema is stable. No acute intracranial abnormality.  11/3 EEG: no seizure  Micro Data:  11/1 Flu >> negative 11/1 Covid >> negative 11/1 Blood culture >> K pneumoniae 11/1 Urine culture >> K pneumoniae 11/5 resp culture> normal flora  Antimicrobials:  11/1 Cefepime >> 11/2 11/2 Metronidazole >> 11/3 11/2 Vancomycin >> 11/3  Azactam 11/2 >>   Interim history/subjective:  Awake, not able to interact No overnight events Currently on PSV 8 , PEEP of 5  Objective   Blood pressure 106/65, pulse 79, temperature (!) 97.4 F (36.3 C), temperature source Axillary, resp. rate 13, height 5'  10" (1.778 m), weight 74.5 kg, SpO2 100 %.    Vent Mode: PSV;CPAP FiO2 (%):  [30 %] 30 % Set Rate:  [12 bmp] 12 bmp Vt Set:  [580 mL] 580 mL PEEP:  [5 cmH20] 5 cmH20 Pressure Support:  [8 cmH20] 8 cmH20 Plateau Pressure:  [13 cmH20] 13 cmH20   Intake/Output Summary (Last 24 hours) at 09/28/2020 1137 Last data filed at 09/28/2020 1100 Gross per 24 hour  Intake 2556.81 ml  Output 2600 ml  Net -43.19 ml   Filed Weights   09/23/20 0437 09/27/20 0500 09/28/20 0500  Weight: 78.7 kg 74.5 kg 74.5 kg    Examination: General: On vent, chronically ill-appearing HENT: Moist oral mucosa PULM: Decreased air movement at the bases bilaterally  CV: S1-S2 appreciated GI: Bowel sounds appreciated MSK: Normal bulk Neuro: Tongue fasciculations  Resolved Hospital Problem list   Left lung collapse/possible mucus plug due to neuromuscular weakness, made worse in setting of sepsis > resolved  Assessment & Plan:  Sepsis/septic shock Klebsiella pneumonia bacteremia Aztreonam for 11 days Leukocytosis is stable  Acute respiratory failure with hypoxia Unlikely to be able to protect his airway secondary to profound weakness -Weaning as tolerated -Failed extubation on 63/3  Metabolic encephalopathy Quadriplegia Tongue fasciculation unchanged from baseline  Diffuse encephalopathy Seizures -Continue Keppra  Anemia -We will transfuse per protocol -Did receive transfusion for hemoglobin of 6.2 during this admission -He has no evidence of bleeding -Hematocrit is trending down -Will transfuse 1 unit PRBC  Protein calorie malnutrition PEG in place Continue tube feeding  Hypoglycemia -SSI -Goal sugar 140-180  Did have family meeting with patient's parents and spouse with a sibling on the  phone11/9 -Consensus is to give him more time to see whether he can come off the ventilator -They do not want him to have a tracheostomy at present -Will continue current treatments -Weaning as  tolerated -Physical therapy did evaluate him on 09/27/2020-range of motion exercises will be performed as patient was not able to stay awake or alert enough to participate  Best practice:  Diet: tube feeding Pain/Anxiety/Delirium protocol (if indicated): as above VAP protocol (if indicated): yes DVT prophylaxis: SCD's with thrombocytopenia GI prophylaxis: Pantoprazole for stress ulcer prophylaxis Glucose control: monitor Mobility: bed rest Code Status: full Family Communication: Had family meeting 11/9 Disposition: icu  Labs   CBC: Recent Labs  Lab 09/24/20 0240 09/25/20 0303 09/26/20 0254 09/27/20 0808 09/28/20 0301  WBC 17.1* 16.7* 13.0* 10.2 10.3  NEUTROABS 14.1* 13.2* 10.7* 7.7 7.3  HGB 8.4* 8.5* 8.3* 7.8* 7.2*  HCT 26.4* 26.0* 25.8* 23.9* 23.0*  MCV 89.2 87.5 86.9 89.8 89.8  PLT 124* 149* 157 255 417    Basic Metabolic Panel: Recent Labs  Lab 09/22/20 0751 09/23/20 0243 09/24/20 0240 09/25/20 0303  NA 140 141 142 141  K 4.1 4.5 3.9 3.4*  CL 109 109 108 104  CO2 20* 22 24 24   GLUCOSE 139* 98 104* 113*  BUN 34* 37* 34* 34*  CREATININE 0.34* 0.37* <0.30* <0.30*  CALCIUM 7.7* 8.1* 8.1* 8.4*   GFR: CrCl cannot be calculated (This lab value cannot be used to calculate CrCl because it is not a number: <0.30). Recent Labs  Lab 09/25/20 0303 09/26/20 0254 09/27/20 0808 09/28/20 0301  WBC 16.7* 13.0* 10.2 10.3    Liver Function Tests: Recent Labs  Lab 09/23/20 0243 09/24/20 0240 09/25/20 0303  AST 30 33 56*  ALT 66* 62* 101*  ALKPHOS 85 105 131*  BILITOT 0.5 0.5 0.8  PROT 4.8* 4.8* 5.5*  ALBUMIN 1.8* 1.8* 2.1*   No results for input(s): LIPASE, AMYLASE in the last 168 hours. No results for input(s): AMMONIA in the last 168 hours.  ABG    Component Value Date/Time   PHART 7.252 (L) 09/21/2020 1355   PCO2ART 49.9 (H) 09/21/2020 1355   PO2ART 96.6 09/21/2020 1355   HCO3 21.2 09/21/2020 1355   TCO2 28 08/22/2020 1854   ACIDBASEDEF 5.4 (H)  09/21/2020 1355   O2SAT 97.5 09/21/2020 1355     Coagulation Profile: No results for input(s): INR, PROTIME in the last 168 hours.  Cardiac Enzymes: No results for input(s): CKTOTAL, CKMB, CKMBINDEX, TROPONINI in the last 168 hours.  HbA1C: Hgb A1c MFr Bld  Date/Time Value Ref Range Status  08/14/2020 04:55 AM 6.1 (H) 4.8 - 5.6 % Final    Comment:    (NOTE) Pre diabetes:          5.7%-6.4%  Diabetes:              >6.4%  Glycemic control for   <7.0% adults with diabetes     CBG: Recent Labs  Lab 09/27/20 1546 09/27/20 1929 09/27/20 2344 09/28/20 0326 09/28/20 0735  GLUCAP 134* 108* 100* 80 113*   The patient is critically ill with multiple organ systems failure and requires high complexity decision making for assessment and support, frequent evaluation and titration of therapies, application of advanced monitoring technologies and extensive interpretation of multiple databases. Critical Care Time devoted to patient care services described in this note independent of APP/resident time (if applicable)  is 30 minutes.   Sherrilyn Rist MD  Pulmonary Critical Care Personal pager: #  [phone] If unanswered, please page CCM On-call: [phone]

## 2020-09-28 NOTE — Progress Notes (Signed)
Nutrition Follow-up  DOCUMENTATION CODES:   Not applicable  INTERVENTION:   Tube Feeding via G-tube:  Vital AF 1.2 at 60 ml/hr  Provides 1728 kcals, 108 g of protien and 1167 mL of free water  Add Juven BID, each packet provides 80 calories, 8 grams of carbohydrate, 2.5  grams of protein (collagen), 7 grams of L-arginine and 7 grams of L-glutamine; supplement contains CaHMB, Vitamins C, E, B12 and Zinc to promote wound healing  If stool output is not excessive, recommend considering removal of rectal tube  NUTRITION DIAGNOSIS:   Inadequate oral intake related to inability to eat as evidenced by NPO status.  Being addressed via TF   GOAL:   Patient will meet greater than or equal to 90% of their needs  Progressing   MONITOR:   Vent status, TF tolerance, Labs, Weight trends, Skin  REASON FOR ASSESSMENT:   Consult Enteral/tube feeding initiation and management  ASSESSMENT:   44 y.o. male with medical history of impaired hearing, neurofibromatosis 2, neurogenic bladder, pressure ulcer, quadriplegia, seizures, vestibular schwannoma, and cerebral thrombosis with cerebral infarction. He was intubated 9/21-10/22 at [facility]. He presented to the ED due to worsening mental status since 10/29. EMS reported to ED staff that patient was unresponsive to sternal rub initially.  11/01 Admitted 11/02 Intubated 11/04 Extubated, Re-Intubated  Attempting to wean, family does not want trach at this time, trying to give patient more time to see if he can be liberated from vent  Patient is currently intubated on ventilator support MV: 9.3 L/min Temp (24hrs), Avg:97.7 F (36.5 C), Min:97 F (36.1 C), Max:98.5 F (36.9 C)  Propofol: NONE  Tolerating Vital AF 1.2 at 60 ml/hr via G-tube. Free water flush of 200 mL q 6 hours  Current wt 74.5 kg; admission wt of 72.4 kg. Net +5 L. Unsure of actual dry weight. Plan to utilize 72 kg as EDW at this time  Spoke with RN who indicates  stool output has not been high, flexiseal functioning. No stool output documented in I/O flow sheet in at least 24  Hours; noted smear noted yesterday. Some stool today?  Labs: reviewed Meds: Vit C 250 mg BID, decadron, ferrous sulfate, ss novolog, lactobacillus   Diet Order:   Diet Order            Diet NPO time specified  Diet effective now                 EDUCATION NEEDS:   No education needs have been identified at this time  Skin:  Skin Assessment: Skin Integrity Issues: Skin Integrity Issues:: Stage I, Stage III Stage I: R elbow; R heel Stage III: sacrum  Last BM:  11/5 -type 7  Height:   Ht Readings from Last 1 Encounters:  09/23/20 5\' 10"  (1.778 m)    Weight:   Wt Readings from Last 1 Encounters:  09/28/20 74.5 kg    BMI:  Body mass index is 23.57 kg/m.  Estimated Nutritional Needs:   Kcal:  1805 kcals  Protein:  105-130 g  Fluid:  >/= 2 L/day    Kerman Passey MS, RDN, LDN, CNSC Registered Dietitian III Clinical Nutrition RD Pager and On-Call Pager Number Located in [place]

## 2020-09-28 NOTE — Progress Notes (Signed)
[facility] ([facility]) Community Based Palliative Care  This patient is enrolled in our palliative care services in the community.   [facility] will continue to follow for any discharge planning needs and to coordinate continuation of palliative care.  If you have questions or need assistance, please call [phone] or contact the hospital Liaison listed on AMION.  Thank you for the opportunity to participate in this patient's care.  Sherry Gibson, RN [facility] Hospital Liaison [phone] 

## 2020-09-29 DIAGNOSIS — J9602 Acute respiratory failure with hypercapnia: Secondary | ICD-10-CM | POA: Diagnosis not present

## 2020-09-29 DIAGNOSIS — A419 Sepsis, unspecified organism: Secondary | ICD-10-CM | POA: Diagnosis not present

## 2020-09-29 LAB — CBC WITH DIFFERENTIAL/PLATELET
Abs Immature Granulocytes: 1.56 10*3/uL — ABNORMAL HIGH (ref 0.00–0.07)
Basophils Absolute: 0.1 10*3/uL (ref 0.0–0.1)
Basophils Relative: 1 %
Eosinophils Absolute: 0 10*3/uL (ref 0.0–0.5)
Eosinophils Relative: 0 %
HCT: 23.3 % — ABNORMAL LOW (ref 39.0–52.0)
Hemoglobin: 7.4 g/dL — ABNORMAL LOW (ref 13.0–17.0)
Immature Granulocytes: 15 %
Lymphocytes Relative: 12 %
Lymphs Abs: 1.3 10*3/uL (ref 0.7–4.0)
MCH: 28.4 pg (ref 26.0–34.0)
MCHC: 31.8 g/dL (ref 30.0–36.0)
MCV: 89.3 fL (ref 80.0–100.0)
Monocytes Absolute: 0.7 10*3/uL (ref 0.1–1.0)
Monocytes Relative: 7 %
Neutro Abs: 6.8 10*3/uL (ref 1.7–7.7)
Neutrophils Relative %: 65 %
Platelets: 344 10*3/uL (ref 150–400)
RBC: 2.61 MIL/uL — ABNORMAL LOW (ref 4.22–5.81)
RDW: 18.9 % — ABNORMAL HIGH (ref 11.5–15.5)
WBC: 10.5 10*3/uL (ref 4.0–10.5)
nRBC: 0 % (ref 0.0–0.2)

## 2020-09-29 LAB — GLUCOSE, CAPILLARY
Glucose-Capillary: 131 mg/dL — ABNORMAL HIGH (ref 70–99)
Glucose-Capillary: 143 mg/dL — ABNORMAL HIGH (ref 70–99)
Glucose-Capillary: 92 mg/dL (ref 70–99)
Glucose-Capillary: 93 mg/dL (ref 70–99)
Glucose-Capillary: 97 mg/dL (ref 70–99)
Glucose-Capillary: 99 mg/dL (ref 70–99)

## 2020-09-29 NOTE — Progress Notes (Signed)
NAME:  Cameron Hale, [name]:  [id number], DOB:  [birth year], LOS: 33 ADMISSION DATE:  10/15/2020, CONSULTATION DATE:  11/2 REFERRING MD:  Dyann Kief, CHIEF COMPLAINT:  Confusion   Brief History   44 y/o male with quadriplegia and recent intubation admitted from [facility] in the setting of respiratory failure likely from respiratory muscle weakness exacerbated by sepsis of a urinary origin.  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 G-tube (09/05/2020)  Significant Hospital Events   Admitted to [facility] 11/1>>11/2 Transferred to [facility] 11/2>>, on pressors. 11/3: Pressors off.  Growing K pneumoniae in blood and urine, antibiotics narrowed. EEG obtained. Was neg for seizure Triggered apnea alarm on vent. Fent gtt stopped. Not able to wean  11/4 more awake. On SBT volumes look good. Extubated, then re-intubated 11/5 family conversation: doesn't want tracheostomy, want to give him more time to try to get stronger 11/8 uneventful 11/9- family meeting regarding long term goals  11/10 no overnight events 11/11-no overnight events  Consults:  PCCM  Procedures:  11/2 ETT > 11/4, 11/4 >   Significant Diagnostic Tests:  11/1 CT >> extensive meningiomatosis noted unchanged. Bifrontal edema is stable. No acute intracranial abnormality.  11/3 EEG: no seizure  Micro Data:  11/1 Flu >> negative 11/1 Covid >> negative 11/1 Blood culture >> K pneumoniae 11/1 Urine culture >> K pneumoniae 11/5 resp culture> normal flora  Antimicrobials:  11/1 Cefepime >> 11/2 11/2 Metronidazole >> 11/3 11/2 Vancomycin >> 11/3  Azactam 11/2 >>11/11   Interim history/subjective:   Afebrile Remains intubated, critically ill   Objective   Blood pressure (!) 88/53, pulse 83, temperature 98.4 F (36.9 C), temperature source Axillary, resp. rate 13, height 5\' 10"  (1.778 m), weight 74.5 kg,  SpO2 100 %.    Vent Mode: PRVC FiO2 (%):  [30 %] 30 % Set Rate:  [12 bmp] 12 bmp Vt Set:  [580 mL] 580 mL PEEP:  [5 cmH20] 5 cmH20 Pressure Support:  [8 cmH20] 8 cmH20 Plateau Pressure:  [13 cmH20-15 cmH20] 13 cmH20   Intake/Output Summary (Last 24 hours) at 09/29/2020 1056 Last data filed at 09/29/2020 7412 Gross per 24 hour  Intake 1680 ml  Output 1950 ml  Net -270 ml   Filed Weights   09/27/20 0500 09/28/20 0500 09/29/20 0432  Weight: 74.5 kg 74.5 kg 74.5 kg    Examination: General: Middle-age man, on vent, chronically ill-appearing HENT: Moist oral mucosa PULM: Bilateral ventilated breath sounds, no accessory muscle use CV: S1-S2 appreciated GI: Bowel sounds appreciated MSK: Normal bulk Neuro: Tongue fasciculations , quadriparesis, muscle wasting  Chest x-ray 11/8 independently reviewed, no new infiltrates. Labs show no leukocytosis, stable anemia  Resolved Hospital Problem list   Left lung collapse/possible mucus plug due to neuromuscular weakness, made worse in setting of sepsis > resolved  Assessment & Plan:  Sepsis/septic shock Klebsiella pneumonia bacteremia S/p Aztreonam for 11 days Soft blood pressure today, follow  Acute respiratory failure with hypoxia Unlikely to be able to protect his airway secondary to profound weakness -Failed extubation on 11/4 -Daily spontaneous breathing trials  Metabolic encephalopathy Quadriplegia Tongue fasciculation unchanged from baseline -As needed fentanyl, goal RASS 0 -Physical therapy  Seizures -Continue Keppra  Anemia -Status post 2 units PRBC this admit -Transfuse only for hemoglobin 7 or lower  Protein calorie malnutrition PEG in place Continue tube feeding  Hypoglycemia -SSI -Goal sugar 140-180  Goals of care discussion 11/9 -Consensus is  to give him more time to see whether he can come off the ventilator -They do not want him to have a tracheostomy at present -Mother is very clear-patient had  requested that he does not want to go to a nursing home , she feels tracheostomy would worsen his quality of life -Will involve palliative care for further discussion goals of care   Best practice:  Diet: tube feeding Pain/Anxiety/Delirium protocol (if indicated): as above VAP protocol (if indicated): yes DVT prophylaxis: SCD's with thrombocytopenia GI prophylaxis: Pantoprazole  Glucose control: monitor Mobility: bed rest Code Status: full Family Communication: mom 11/12 Disposition: icu  Labs   CBC: Recent Labs  Lab 09/25/20 0303 09/26/20 0254 09/27/20 0808 09/28/20 0301 09/29/20 0300  WBC 16.7* 13.0* 10.2 10.3 10.5  NEUTROABS 13.2* 10.7* 7.7 7.3 6.8  HGB 8.5* 8.3* 7.8* 7.2* 7.4*  HCT 26.0* 25.8* 23.9* 23.0* 23.3*  MCV 87.5 86.9 89.8 89.8 89.3  PLT 149* 157 255 300 940    Basic Metabolic Panel: Recent Labs  Lab 09/23/20 0243 09/24/20 0240 09/25/20 0303  NA 141 142 141  K 4.5 3.9 3.4*  CL 109 108 104  CO2 22 24 24   GLUCOSE 98 104* 113*  BUN 37* 34* 34*  CREATININE 0.37* <0.30* <0.30*  CALCIUM 8.1* 8.1* 8.4*   GFR: CrCl cannot be calculated (This lab value cannot be used to calculate CrCl because it is not a number: <0.30). Recent Labs  Lab 09/26/20 0254 09/27/20 0808 09/28/20 0301 09/29/20 0300  WBC 13.0* 10.2 10.3 10.5    Liver Function Tests: Recent Labs  Lab 09/23/20 0243 09/24/20 0240 09/25/20 0303  AST 30 33 56*  ALT 66* 62* 101*  ALKPHOS 85 105 131*  BILITOT 0.5 0.5 0.8  PROT 4.8* 4.8* 5.5*  ALBUMIN 1.8* 1.8* 2.1*   No results for input(s): LIPASE, AMYLASE in the last 168 hours. No results for input(s): AMMONIA in the last 168 hours.  ABG    Component Value Date/Time   PHART 7.252 (L) 09/21/2020 1355   PCO2ART 49.9 (H) 09/21/2020 1355   PO2ART 96.6 09/21/2020 1355   HCO3 21.2 09/21/2020 1355   TCO2 28 08/22/2020 1854   ACIDBASEDEF 5.4 (H) 09/21/2020 1355   O2SAT 97.5 09/21/2020 1355     The patient is critically ill with  multiple organ systems failure and requires high complexity decision making for assessment and support, frequent evaluation and titration of therapies, application of advanced monitoring technologies and extensive interpretation of multiple databases. Critical Care Time devoted to patient care services described in this note independent of APP/resident  time is 31 minutes.   Kara Mead MD. Shade Flood. [facility] See Amion for pager  If no response to pager , please call [phone]  After 7:00 pm call Elink  [phone]   09/29/2020

## 2020-09-30 ENCOUNTER — Inpatient Hospital Stay (HOSPITAL_COMMUNITY): Payer: Medicare Other

## 2020-09-30 DIAGNOSIS — J9602 Acute respiratory failure with hypercapnia: Secondary | ICD-10-CM | POA: Diagnosis not present

## 2020-09-30 DIAGNOSIS — R6521 Severe sepsis with septic shock: Secondary | ICD-10-CM | POA: Diagnosis not present

## 2020-09-30 DIAGNOSIS — A419 Sepsis, unspecified organism: Secondary | ICD-10-CM | POA: Diagnosis not present

## 2020-09-30 LAB — GLUCOSE, CAPILLARY
Glucose-Capillary: 89 mg/dL (ref 70–99)
Glucose-Capillary: 104 mg/dL — ABNORMAL HIGH (ref 70–99)
Glucose-Capillary: 128 mg/dL — ABNORMAL HIGH (ref 70–99)
Glucose-Capillary: 148 mg/dL — ABNORMAL HIGH (ref 70–99)
Glucose-Capillary: 95 mg/dL (ref 70–99)
Glucose-Capillary: 91 mg/dL (ref 70–99)

## 2020-09-30 LAB — BASIC METABOLIC PANEL
Anion gap: 11 (ref 5–15)
BUN: 56 mg/dL — ABNORMAL HIGH (ref 6–20)
CO2: 24 mmol/L (ref 22–32)
Calcium: 8.6 mg/dL — ABNORMAL LOW (ref 8.9–10.3)
Chloride: 108 mmol/L (ref 98–111)
Creatinine, Ser: <0.3 mg/dL — ABNORMAL LOW (ref 0.61–1.24)
Glucose, Bld: 105 mg/dL — ABNORMAL HIGH (ref 70–99)
Potassium: 4.2 mmol/L (ref 3.5–5.1)
Sodium: 143 mmol/L (ref 135–145)

## 2020-09-30 LAB — MAGNESIUM: Magnesium: 2.1 mg/dL (ref 1.7–2.4)

## 2020-09-30 LAB — CBC WITH DIFFERENTIAL/PLATELET
Abs Immature Granulocytes: 1.95 10*3/uL — ABNORMAL HIGH (ref 0.00–0.07)
Basophils Absolute: 0.1 10*3/uL (ref 0.0–0.1)
Basophils Relative: 1 %
Eosinophils Absolute: 0 10*3/uL (ref 0.0–0.5)
Eosinophils Relative: 0 %
HCT: 23.4 % — ABNORMAL LOW (ref 39.0–52.0)
Hemoglobin: 7.1 g/dL — ABNORMAL LOW (ref 13.0–17.0)
Immature Granulocytes: 20 %
Lymphocytes Relative: 13 %
Lymphs Abs: 1.3 10*3/uL (ref 0.7–4.0)
MCH: 28 pg (ref 26.0–34.0)
MCHC: 30.3 g/dL (ref 30.0–36.0)
MCV: 92.1 fL (ref 80.0–100.0)
Monocytes Absolute: 0.7 10*3/uL (ref 0.1–1.0)
Monocytes Relative: 7 %
Neutro Abs: 5.9 10*3/uL (ref 1.7–7.7)
Neutrophils Relative %: 59 %
Platelets: 394 10*3/uL (ref 150–400)
RBC: 2.54 MIL/uL — ABNORMAL LOW (ref 4.22–5.81)
RDW: 18.8 % — ABNORMAL HIGH (ref 11.5–15.5)
WBC: 9.9 10*3/uL (ref 4.0–10.5)
nRBC: 0 % (ref 0.0–0.2)

## 2020-09-30 LAB — PHOSPHORUS: Phosphorus: 3.4 mg/dL (ref 2.5–4.6)

## 2020-09-30 NOTE — Progress Notes (Signed)
NAME:  Cameron Hale, [name]:  [id number], DOB:  Dec 31, 1975, LOS: 12 ADMISSION DATE:  09/26/2020, CONSULTATION DATE:  11/2 REFERRING MD:  Dyann Kief, CHIEF COMPLAINT:  Confusion   Brief History   44 y/o male with quadriplegia and recent intubation admitted from [facility] in the setting of respiratory failure likely from respiratory muscle weakness exacerbated by sepsis of a urinary origin.  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 G-tube (09/05/2020)  Significant Hospital Events   Admitted to [facility] 11/1>>11/2 Transferred to [facility] 11/2>>, on pressors. 11/3: Pressors off.  Growing K pneumoniae in blood and urine, antibiotics narrowed. EEG obtained. Was neg for seizure Triggered apnea alarm on vent. Fent gtt stopped. Not able to wean  11/4 more awake. On SBT volumes look good. Extubated, then re-intubated 11/5 family conversation: doesn't want tracheostomy, want to give him more time to try to get stronger 11/9- family meeting regarding long term goals    Consults:  PCCM  Procedures:  11/2 ETT > 11/4, 11/4 >   Significant Diagnostic Tests:  11/1 CT >> extensive meningiomatosis noted unchanged. Bifrontal edema is stable. No acute intracranial abnormality.  11/3 EEG: no seizure  Micro Data:  11/1 Flu >> negative 11/1 Covid >> negative 11/1 Blood culture >> K pneumoniae 11/1 Urine culture >> K pneumoniae 11/5 resp culture> normal flora  Antimicrobials:  11/1 Cefepime >> 11/2 11/2 Metronidazole >> 11/3 11/2 Vancomycin >> 11/3  Azactam 11/2 >>11/11   Interim history/subjective:   Remains intubated, critically ill Afebrile Good urine output On 30% FiO2   Objective   Blood pressure 119/68, pulse 91, temperature 98 F (36.7 C), temperature source Axillary, resp. rate 14, height 5\' 10"  (1.778 m), weight 74.5 kg, SpO2  98 %.    Vent Mode: CPAP;PSV FiO2 (%):  [30 %] 30 % Set Rate:  [12 bmp] 12 bmp Vt Set:  [580 mL] 580 mL PEEP:  [5 cmH20] 5 cmH20 Pressure Support:  [5 cmH20] 5 cmH20 Plateau Pressure:  [12 cmH20-13 cmH20] 12 cmH20   Intake/Output Summary (Last 24 hours) at 09/30/2020 0919 Last data filed at 09/30/2020 0600 Gross per 24 hour  Intake 1760 ml  Output 2150 ml  Net -390 ml   Filed Weights   09/28/20 0500 09/29/20 0432 09/30/20 0407  Weight: 74.5 kg 74.5 kg 74.5 kg    Examination: Unchanged General: Middle-age man, on vent, chronically ill-appearing HENT: Moist oral mucosa PULM: Bilateral ventilated breath sounds, no accessory muscle use CV: S1-S2 appreciated GI: Bowel sounds appreciated MSK: Normal bulk Neuro:quadriparesis, muscle wasting, intermittently follows commands  Chest x-ray 11/13 independently reviewed, previous bibasilar opacities have completely cleared Labs show slight drop in hemoglobin to 7.1, normal electrolytes, no leukocytosis  Resolved Hospital Problem list   Left lung collapse/possible mucus plug due to neuromuscular weakness, made worse in setting of sepsis > resolved  Septic shock Klebsiella pneumonia bacteremia S/p Aztreonam for 11 days  Assessment & Plan:   Acute respiratory failure with hypoxia Unlikely to be able to protect his airway secondary to profound weakness -Failed extubation on 11/4 -Daily spontaneous breathing trials , did not wean much yesterday but tolerating 5/5 today  Metabolic encephalopathy Quadriplegia Tongue fasciculation unchanged from baseline -As needed fentanyl, goal RASS 0 -Physical therapy  Seizures -Continue Keppra  Anemia -Status post 2 units PRBC this admit -Transfuse only for hemoglobin 7 or lower, trending lower may need another unit soon  Protein calorie malnutrition PEG in place Continue tube feeding  Hypoglycemia -SSI -Goal sugar 140-180  Goals of care discussion 11/9 -Mother is very  clear-patient had requested that he does not want to go to a nursing home , she feels tracheostomy would worsen his quality of life , he does wean intermittently but main issue is ability to maintain airway, if we proceed with another trial of extubation we will have to establish no intubation status -Will involve palliative care for further discussion goals of care .  He is followed by our [facility] palliative care in the community   Best practice:  Diet: tube feeding Pain/Anxiety/Delirium protocol (if indicated): as above VAP protocol (if indicated): yes DVT prophylaxis: SCD's with thrombocytopenia GI prophylaxis: Pantoprazole  Glucose control: monitor Mobility: bed rest Code Status: full Family Communication: mom  Disposition: icu  Labs   CBC: Recent Labs  Lab 09/26/20 0254 09/27/20 0808 09/28/20 0301 09/29/20 0300 09/30/20 0255  WBC 13.0* 10.2 10.3 10.5 9.9  NEUTROABS 10.7* 7.7 7.3 6.8 5.9  HGB 8.3* 7.8* 7.2* 7.4* 7.1*  HCT 25.8* 23.9* 23.0* 23.3* 23.4*  MCV 86.9 89.8 89.8 89.3 92.1  PLT 157 255 300 344 641    Basic Metabolic Panel: Recent Labs  Lab 09/24/20 0240 09/25/20 0303 09/30/20 0255  NA 142 141 143  K 3.9 3.4* 4.2  CL 108 104 108  CO2 24 24 24   GLUCOSE 104* 113* 105*  BUN 34* 34* 56*  CREATININE <0.30* <0.30* <0.30*  CALCIUM 8.1* 8.4* 8.6*  MG  --   --  2.1  PHOS  --   --  3.4   GFR: CrCl cannot be calculated (This lab value cannot be used to calculate CrCl because it is not a number: <0.30). Recent Labs  Lab 09/27/20 0808 09/28/20 0301 09/29/20 0300 09/30/20 0255  WBC 10.2 10.3 10.5 9.9    Liver Function Tests: Recent Labs  Lab 09/24/20 0240 09/25/20 0303  AST 33 56*  ALT 62* 101*  ALKPHOS 105 131*  BILITOT 0.5 0.8  PROT 4.8* 5.5*  ALBUMIN 1.8* 2.1*   No results for input(s): LIPASE, AMYLASE in the last 168 hours. No results for input(s): AMMONIA in the last 168 hours.  ABG    Component Value Date/Time   PHART 7.252 (L)  09/21/2020 1355   PCO2ART 49.9 (H) 09/21/2020 1355   PO2ART 96.6 09/21/2020 1355   HCO3 21.2 09/21/2020 1355   TCO2 28 08/22/2020 1854   ACIDBASEDEF 5.4 (H) 09/21/2020 1355   O2SAT 97.5 09/21/2020 1355      The patient is critically ill with multiple organ systems failure and requires high complexity decision making for assessment and support, frequent evaluation and titration of therapies, application of advanced monitoring technologies and extensive interpretation of multiple databases. Critical Care Time devoted to patient care services described in this note independent of APP/resident  time is 31 minutes.    Kara Mead MD. Shade Flood. [place] [facility] See Amion for pager  If no response to pager , please call [phone]  After 7:00 pm call Elink  [phone]   09/30/2020

## 2020-10-01 DIAGNOSIS — G8250 Quadriplegia, unspecified: Secondary | ICD-10-CM | POA: Diagnosis not present

## 2020-10-01 DIAGNOSIS — E43 Unspecified severe protein-calorie malnutrition: Secondary | ICD-10-CM | POA: Diagnosis not present

## 2020-10-01 DIAGNOSIS — J9602 Acute respiratory failure with hypercapnia: Secondary | ICD-10-CM | POA: Diagnosis not present

## 2020-10-01 LAB — CBC WITH DIFFERENTIAL/PLATELET
Abs Immature Granulocytes: 1.99 10*3/uL — ABNORMAL HIGH (ref 0.00–0.07)
Basophils Absolute: 0.1 10*3/uL (ref 0.0–0.1)
Basophils Relative: 1 %
Eosinophils Absolute: 0 10*3/uL (ref 0.0–0.5)
Eosinophils Relative: 0 %
HCT: 24.9 % — ABNORMAL LOW (ref 39.0–52.0)
Hemoglobin: 7.6 g/dL — ABNORMAL LOW (ref 13.0–17.0)
Immature Granulocytes: 20 %
Lymphocytes Relative: 15 %
Lymphs Abs: 1.5 10*3/uL (ref 0.7–4.0)
MCH: 28.1 pg (ref 26.0–34.0)
MCHC: 30.5 g/dL (ref 30.0–36.0)
MCV: 92.2 fL (ref 80.0–100.0)
Monocytes Absolute: 0.7 10*3/uL (ref 0.1–1.0)
Monocytes Relative: 7 %
Neutro Abs: 5.5 10*3/uL (ref 1.7–7.7)
Neutrophils Relative %: 57 %
Platelets: 452 10*3/uL — ABNORMAL HIGH (ref 150–400)
RBC: 2.7 MIL/uL — ABNORMAL LOW (ref 4.22–5.81)
RDW: 18.7 % — ABNORMAL HIGH (ref 11.5–15.5)
WBC: 9.8 10*3/uL (ref 4.0–10.5)
nRBC: 0 % (ref 0.0–0.2)

## 2020-10-01 LAB — MAGNESIUM: Magnesium: 1.9 mg/dL (ref 1.7–2.4)

## 2020-10-01 LAB — BASIC METABOLIC PANEL
Anion gap: 11 (ref 5–15)
BUN: 54 mg/dL — ABNORMAL HIGH (ref 6–20)
CO2: 24 mmol/L (ref 22–32)
Calcium: 8.8 mg/dL — ABNORMAL LOW (ref 8.9–10.3)
Chloride: 110 mmol/L (ref 98–111)
Creatinine, Ser: <0.3 mg/dL — ABNORMAL LOW (ref 0.61–1.24)
Glucose, Bld: 107 mg/dL — ABNORMAL HIGH (ref 70–99)
Potassium: 3.7 mmol/L (ref 3.5–5.1)
Sodium: 145 mmol/L (ref 135–145)

## 2020-10-01 LAB — GLUCOSE, CAPILLARY
Glucose-Capillary: 106 mg/dL — ABNORMAL HIGH (ref 70–99)
Glucose-Capillary: 111 mg/dL — ABNORMAL HIGH (ref 70–99)
Glucose-Capillary: 115 mg/dL — ABNORMAL HIGH (ref 70–99)
Glucose-Capillary: 124 mg/dL — ABNORMAL HIGH (ref 70–99)
Glucose-Capillary: 125 mg/dL — ABNORMAL HIGH (ref 70–99)
Glucose-Capillary: 90 mg/dL (ref 70–99)

## 2020-10-01 LAB — PHOSPHORUS: Phosphorus: 3 mg/dL (ref 2.5–4.6)

## 2020-10-01 MED ORDER — POTASSIUM CHLORIDE 20 MEQ/15ML (10%) PO SOLN
40.0000 meq | Freq: Once | ORAL | Status: AC
  Administered 2020-10-01: 40 meq
  Filled 2020-10-01: qty 30

## 2020-10-01 NOTE — Plan of Care (Signed)

## 2020-10-01 NOTE — Progress Notes (Signed)
NAME:  Cameron Hale, [name]:  [id number], DOB:  31-Aug-1976, LOS: 80 ADMISSION DATE:  10/08/2020, CONSULTATION DATE:  11/2 REFERRING MD:  Dyann Kief, CHIEF COMPLAINT:  Confusion   Brief History   44 y/o male with quadriplegia and recent intubation admitted from [facility] in the setting of respiratory failure likely from respiratory muscle weakness exacerbated by sepsis of a urinary origin.  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 G-tube (09/05/2020)  Significant Hospital Events   Admitted to [facility] 11/1>>11/2 Transferred to [facility] 11/2>>, on pressors. 11/3: Pressors off.  Growing K pneumoniae in blood and urine, antibiotics narrowed. EEG obtained. Was neg for seizure Triggered apnea alarm on vent. Fent gtt stopped. Not able to wean  11/4 more awake. On SBT volumes look good. Extubated, then re-intubated 11/5 family conversation: doesn't want tracheostomy, want to give him more time to try to get stronger 11/9- family meeting regarding long term goals    Consults:  PCCM  Procedures:  11/2 ETT > 11/4, 11/4 >   Significant Diagnostic Tests:  11/1 CT >> extensive meningiomatosis noted unchanged. Bifrontal edema is stable. No acute intracranial abnormality.  11/3 EEG: no seizure  Micro Data:  11/1 Flu >> negative 11/1 Covid >> negative 11/1 Blood culture >> K pneumoniae 11/1 Urine culture >> K pneumoniae 11/5 resp culture> normal flora  Antimicrobials:  11/1 Cefepime >> 11/2 11/2 Metronidazole >> 11/3 11/2 Vancomycin >> 11/3  Azactam 11/2 >>11/11   Interim history/subjective:   Remains critically ill, intubated Minimum oxygen requirements Afebrile Good urine output   Objective   Blood pressure (!) 98/56, pulse 85, temperature (!) 97.5 F (36.4 C), temperature source Oral, resp. rate 14, height 5\' 10"  (1.778 m),  weight 73.8 kg, SpO2 100 %.    Vent Mode: PRVC FiO2 (%):  [30 %] 30 % Set Rate:  [12 bmp] 12 bmp Vt Set:  [580 mL] 580 mL PEEP:  [5 cmH20] 5 cmH20 Pressure Support:  [5 cmH20] 5 cmH20 Plateau Pressure:  [9 cmH20-18 cmH20] 9 cmH20   Intake/Output Summary (Last 24 hours) at 10/01/2020 0917 Last data filed at 10/01/2020 0800 Gross per 24 hour  Intake 3690 ml  Output 1750 ml  Net 1940 ml   Filed Weights   09/29/20 0432 09/30/20 0407 10/01/20 0334  Weight: 74.5 kg 74.5 kg 73.8 kg    Examination:  Gen:      Middle-age man, chronically ill-appearing, no distress  HEENT:  EOMI, sclera anicteric, mild pallor Neck:     No JVD; no thyromegaly Lungs:   Bilateral ventilated breath sounds CV:         Regular rate and rhythm; no murmurs Abd:      + bowel sounds; soft, non-tender; no palpable masses, no distension Ext:    No edema; adequate peripheral perfusion Skin:      Warm and dry; no rash Neuro: quadriparesis, muscle wasting, intermittently follows commands    Chest x-ray 11/13 independently reviewed shows complete clearing of bilateral infiltrates Labs show stable hemoglobin, no leukocytosis, normal electrolytes  Resolved Hospital Problem list   Left lung collapse/possible mucus plug due to neuromuscular weakness, made worse in setting of sepsis > resolved  Septic shock Klebsiella pneumonia bacteremia S/p Aztreonam for 11 days  Assessment & Plan:   Acute respiratory failure with hypoxia Unlikely to be able to protect his airway secondary to profound weakness -Failed extubation on 11/4 -  Daily spontaneous breathing trials , tolerated pressure support 5/5 on 11/13 -Need clarification goals of care before extubation  Metabolic encephalopathy Quadriplegia Tongue fasciculation unchanged from baseline -As needed fentanyl, goal RASS 0 -Physical therapy  Seizures -Continue Keppra  Anemia -Status post 2 units PRBC this admit -Transfuse only for hemoglobin 7 or  lower  Protein calorie malnutrition PEG in place Continue tube feeding  Hypoglycemia -SSI -Goal sugar 140-180  Goals of care discussion -ongoing since 11/9 -Mother is very clear-patient had requested that he does not want to go to a nursing home , she feels tracheostomy would worsen his quality of life , he does wean intermittently but main issue is ability to maintain airway, if we proceed with another trial of extubation need to establish no re-intubation status.  Mom is not willing to commit to this -Have involved palliative care for further discussion goals of care .  He is followed by [facility] care in the community   Best practice:  Diet: tube feeding Pain/Anxiety/Delirium protocol (if indicated): as above VAP protocol (if indicated): yes DVT prophylaxis: SCD's with thrombocytopenia GI prophylaxis: Pantoprazole  Glucose control: monitor Mobility: bed rest Code Status: full Family Communication: mom  Disposition: icu  Labs   CBC: Recent Labs  Lab 09/27/20 0808 09/28/20 0301 09/29/20 0300 09/30/20 0255 10/01/20 0253  WBC 10.2 10.3 10.5 9.9 9.8  NEUTROABS 7.7 7.3 6.8 5.9 5.5  HGB 7.8* 7.2* 7.4* 7.1* 7.6*  HCT 23.9* 23.0* 23.3* 23.4* 24.9*  MCV 89.8 89.8 89.3 92.1 92.2  PLT 255 300 344 394 452*    Basic Metabolic Panel: Recent Labs  Lab 09/25/20 0303 09/30/20 0255 10/01/20 0253  NA 141 143 145  K 3.4* 4.2 3.7  CL 104 108 110  CO2 24 24 24   GLUCOSE 113* 105* 107*  BUN 34* 56* 54*  CREATININE <0.30* <0.30* <0.30*  CALCIUM 8.4* 8.6* 8.8*  MG  --  2.1 1.9  PHOS  --  3.4 3.0   GFR: CrCl cannot be calculated (This lab value cannot be used to calculate CrCl because it is not a number: <0.30). Recent Labs  Lab 09/28/20 0301 09/29/20 0300 09/30/20 0255 10/01/20 0253  WBC 10.3 10.5 9.9 9.8    Liver Function Tests: Recent Labs  Lab 09/25/20 0303  AST 56*  ALT 101*  ALKPHOS 131*  BILITOT 0.8  PROT 5.5*  ALBUMIN 2.1*   No results for  input(s): LIPASE, AMYLASE in the last 168 hours. No results for input(s): AMMONIA in the last 168 hours.  ABG    Component Value Date/Time   PHART 7.252 (L) 09/21/2020 1355   PCO2ART 49.9 (H) 09/21/2020 1355   PO2ART 96.6 09/21/2020 1355   HCO3 21.2 09/21/2020 1355   TCO2 28 08/22/2020 1854   ACIDBASEDEF 5.4 (H) 09/21/2020 1355   O2SAT 97.5 09/21/2020 1355     The patient is critically ill with multiple organ systems failure and requires high complexity decision making for assessment and support, frequent evaluation and titration of therapies, application of advanced monitoring technologies and extensive interpretation of multiple databases. Critical Care Time devoted to patient care services described in this note independent of APP/resident  time is 31 minutes.     Kara Mead MD. Shade Flood. [place] [facility] See Amion for pager  If no response to pager , please call [phone]  After 7:00 pm call Elink  [phone]   10/01/2020

## 2020-10-01 NOTE — Progress Notes (Signed)
RT tried to wean the 3 times throughout the day. Pt's RR was to low. Below 8. RT will continue to monitor

## 2020-10-01 NOTE — Progress Notes (Signed)
PMT consult received and chart reviewed. Discussed with Dr. Elsworth Soho and RN, Barnett Applebaum. No family at bedside. Spoke with mother, Cameron Hale via telephone. She is familiar with palliative medicine from previous admission and shares that they are not prepared to talk with PMT provider today. Offered to arrange meeting this week to further discuss plan and options. Ms. Cameron Hale shares that it is important for patient's spouse to be involved in discussions and that his wife is out of town this weekend and works Architectural technologist. PMT provider contact information given. Mother states she will call PMT provider if/when they are ready to meet.   Updated RN.   NO CHARGE  Ihor Dow, [place], FNP-C Palliative Medicine Team  Phone: [phone] Fax: [phone]

## 2020-10-01 NOTE — Progress Notes (Signed)
RT tried weaning the Pt 2 times today and the Pt's RR would go below 8. RT will continue to monitor

## 2020-10-02 DIAGNOSIS — A419 Sepsis, unspecified organism: Secondary | ICD-10-CM | POA: Diagnosis not present

## 2020-10-02 LAB — GLUCOSE, CAPILLARY
Glucose-Capillary: 101 mg/dL — ABNORMAL HIGH (ref 70–99)
Glucose-Capillary: 107 mg/dL — ABNORMAL HIGH (ref 70–99)
Glucose-Capillary: 74 mg/dL (ref 70–99)
Glucose-Capillary: 84 mg/dL (ref 70–99)
Glucose-Capillary: 89 mg/dL (ref 70–99)
Glucose-Capillary: 90 mg/dL (ref 70–99)

## 2020-10-02 NOTE — Progress Notes (Signed)
PT Cancellation Note  Patient Details Name: Cameron Hale [name]: [id number] DOB: 29-Jul-1976   Cancelled Treatment:     Per RN pt is not able to tolerate anything other than PROM.  Will ask LPT to review case.   Rica Koyanagi  PTA Acute  Rehabilitation Services Pager      [phone] Office      [phone]

## 2020-10-02 NOTE — Progress Notes (Signed)

## 2020-10-02 NOTE — Progress Notes (Signed)
NAME:  JUDITH CAMPILLO, [name]:  [id number], DOB:  22-Feb-1976, LOS: 61 ADMISSION DATE:  10/16/2020, CONSULTATION DATE:  11/2 REFERRING MD:  Dyann Kief, CHIEF COMPLAINT:  Confusion   Brief History   44 y/o male with quadriplegia and recent intubation admitted from [facility] in the setting of respiratory failure likely from respiratory muscle weakness exacerbated by sepsis of a urinary origin.  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 G-tube (09/05/2020)  Significant Hospital Events   Admitted to [facility] 11/1>>11/2 Transferred to [facility] 11/2>>, on pressors. 11/3: Pressors off.  Growing K pneumoniae in blood and urine, antibiotics narrowed. EEG obtained. Was neg for seizure Triggered apnea alarm on vent. Fent gtt stopped. Not able to wean  11/4 more awake. On SBT volumes look good. Extubated, then re-intubated 11/5 family conversation: doesn't want tracheostomy, want to give him more time to try to get stronger 11/9- family meeting regarding long term goals    Consults:  PCCM  Procedures:  11/2 ETT > 11/4, 11/4 >   Significant Diagnostic Tests:  11/1 CT >> extensive meningiomatosis noted unchanged. Bifrontal edema is stable. No acute intracranial abnormality.  11/3 EEG: no seizure  Micro Data:  11/1 Flu >> negative 11/1 Covid >> negative 11/1 Blood culture >> K pneumoniae 11/1 Urine culture >> K pneumoniae 11/5 resp culture> normal flora  Antimicrobials:  11/1 Cefepime >> 11/2 11/2 Metronidazole >> 11/3 11/2 Vancomycin >> 11/3  Azactam 11/2 >>11/11   Interim history/subjective:   No acute events overnight, remains on the vent  Objective   Blood pressure 114/60, pulse 92, temperature (!) 97.3 F (36.3 C), temperature source Oral, resp. rate 13, height 5\' 10"  (1.778 m), weight 73.6 kg, SpO2 100 %.    Vent Mode:  PRVC FiO2 (%):  [30 %] 30 % Set Rate:  [12 bmp] 12 bmp Vt Set:  [580 mL] 580 mL PEEP:  [5 cmH20] 5 cmH20 Pressure Support:  [15 cmH20] 15 cmH20 Plateau Pressure:  [13 cmH20-16 cmH20] 13 cmH20   Intake/Output Summary (Last 24 hours) at 10/02/2020 1002 Last data filed at 10/02/2020 0615 Gross per 24 hour  Intake 1620 ml  Output 2800 ml  Net -1180 ml   Filed Weights   09/30/20 0407 10/01/20 0334 10/02/20 0425  Weight: 74.5 kg 73.8 kg 73.6 kg    Examination:  Gen:      No acute distress, chronically ill-appearing HEENT:  EOMI, sclera anicteric Neck:     No masses; no thyromegaly Lungs:    Clear to auscultation bilaterally; normal respiratory effort CV:         Regular rate and rhythm; no murmurs Abd:      + bowel sounds; soft, non-tender; no palpable masses, no distension Ext:    No edema; adequate peripheral perfusion Skin:      Warm and dry; no rash Neuro: Quadriparesis, somnolent  Labs/imaging personally reviewed Significant for BUN/creatinine 54/less than 0.3, WBC 9, hemoglobin 7.6 No new imaging  Resolved Hospital Problem list   Left lung collapse/possible mucus plug due to neuromuscular weakness, made worse in setting of sepsis > resolved  Septic shock Klebsiella pneumonia bacteremia S/p Aztreonam for 11 days  Assessment & Plan:   Acute respiratory failure with hypoxia Unlikely to be able to protect his airway secondary to profound weakness -Failed extubation on 11/4 Continue SBT's.  Tolerating pressure support Ongoing goals of care discussion before extubation  Metabolic encephalopathy Quadriplegia Tongue fasciculation unchanged from baseline -As needed fentanyl, goal RASS 0 -Physical therapy  Seizures -Continue Keppra  Anemia -Status post 2 units PRBC this admit Follow CBC.  Transfuse for hemoglobin less than 7.  No active bleed at present.  Protein calorie malnutrition PEG in place Continue tube feeding  Hypoglycemia -SSI -Goal sugar  140-180  Goals of care discussion -ongoing since 11/9 -Mother is very clear-patient had requested that he does not want to go to a nursing home , she feels tracheostomy would worsen his quality of life , he does wean intermittently but main issue is ability to maintain airway, if we proceed with another trial of extubation need to establish no re-intubation status.  Mom is not willing to commit to this. Per discussion over the weekend mother once wife involved.  Awaiting her arrival to continue conversations Inpatient palliative medicine consulted as well. He is followed by [facility] palliative care in the community  Best practice:  Diet: tube feeding Pain/Anxiety/Delirium protocol (if indicated): as above VAP protocol (if indicated): yes DVT prophylaxis: SCD's with thrombocytopenia GI prophylaxis: Pantoprazole  Glucose control: monitor Mobility: bed rest Code Status: full Family Communication: pedning  Disposition: icu  Signature:   The patient is critically ill with multiple organ system failure and requires high complexity decision making for assessment and support, frequent evaluation and titration of therapies, advanced monitoring, review of radiographic studies and interpretation of complex data.   Critical Care Time devoted to patient care services, exclusive of separately billable procedures, described in this note is 35 minutes.   Marshell Garfinkel MD [place] [facility] Please see [email or web address] for pager details.  10/02/2020, 10:02 AM

## 2020-10-03 ENCOUNTER — Inpatient Hospital Stay (HOSPITAL_COMMUNITY): Payer: Medicare Other

## 2020-10-03 DIAGNOSIS — A419 Sepsis, unspecified organism: Secondary | ICD-10-CM | POA: Diagnosis not present

## 2020-10-03 LAB — GLUCOSE, CAPILLARY
Glucose-Capillary: 93 mg/dL (ref 70–99)
Glucose-Capillary: 97 mg/dL (ref 70–99)
Glucose-Capillary: 103 mg/dL — ABNORMAL HIGH (ref 70–99)
Glucose-Capillary: 113 mg/dL — ABNORMAL HIGH (ref 70–99)
Glucose-Capillary: 102 mg/dL — ABNORMAL HIGH (ref 70–99)

## 2020-10-03 LAB — CBC
HCT: 23.2 % — ABNORMAL LOW (ref 39.0–52.0)
Hemoglobin: 7.2 g/dL — ABNORMAL LOW (ref 13.0–17.0)
MCH: 28.7 pg (ref 26.0–34.0)
MCHC: 31 g/dL (ref 30.0–36.0)
MCV: 92.4 fL (ref 80.0–100.0)
Platelets: 464 10*3/uL — ABNORMAL HIGH (ref 150–400)
RBC: 2.51 MIL/uL — ABNORMAL LOW (ref 4.22–5.81)
RDW: 18.6 % — ABNORMAL HIGH (ref 11.5–15.5)
WBC: 8.7 10*3/uL (ref 4.0–10.5)
nRBC: 0 % (ref 0.0–0.2)

## 2020-10-03 LAB — BASIC METABOLIC PANEL
Anion gap: 10 (ref 5–15)
BUN: 66 mg/dL — ABNORMAL HIGH (ref 6–20)
CO2: 26 mmol/L (ref 22–32)
Calcium: 8.7 mg/dL — ABNORMAL LOW (ref 8.9–10.3)
Chloride: 109 mmol/L (ref 98–111)
Creatinine, Ser: 0.3 mg/dL — ABNORMAL LOW (ref 0.61–1.24)
GFR, Estimated: >60 mL/min (ref >60)
Glucose, Bld: 100 mg/dL — ABNORMAL HIGH (ref 70–99)
Potassium: 4.1 mmol/L (ref 3.5–5.1)
Sodium: 145 mmol/L (ref 135–145)

## 2020-10-03 LAB — PHOSPHORUS: Phosphorus: 3.9 mg/dL (ref 2.5–4.6)

## 2020-10-03 LAB — MAGNESIUM: Magnesium: 2 mg/dL (ref 1.7–2.4)

## 2020-10-03 MED ORDER — ALBUMIN HUMAN 25 % IV SOLN
25.0000 g | Freq: Once | INTRAVENOUS | Status: AC
  Administered 2020-10-03: 25 g via INTRAVENOUS

## 2020-10-03 MED ORDER — CHLORHEXIDINE GLUCONATE 0.12 % MT SOLN
OROMUCOSAL | Status: AC
  Administered 2020-10-03: 15 mL via OROMUCOSAL
  Filled 2020-10-03: qty 15

## 2020-10-03 MED ORDER — SODIUM CHLORIDE 0.9% FLUSH
10.0000 mL | Freq: Three times a day (TID) | INTRAVENOUS | Status: DC
  Administered 2020-10-03 – 2020-10-07 (×12): 10 mL

## 2020-10-03 NOTE — Progress Notes (Signed)
CRITICAL VALUE ALERT  Critical Value:  Pneumothorax on this morning CXR  Date & Time Notied:  10/03/20 @ 0645  Provider Notified: Wyn Forster, RN will inform MD  Orders Received/Actions taken: will pass on to day doctor

## 2020-10-03 NOTE — Procedures (Signed)
Insertion of Chest Tube Procedure Note  Cameron Hale  [id number]  05/12/1976  Date:10/03/20  Time:8:43 AM    Provider Performing: Marshell Garfinkel   Procedure: Chest Tube Insertion (79892)  Indication(s) Pneumothorax  Consent Risks of the procedure as well as the alternatives and risks of each were explained to the patient and/or caregiver.  Consent for the procedure was obtained and is signed in the bedside chart  Anesthesia Topical only with 1% lidocaine    Time Out Verified patient identification, verified procedure, site/side was marked, verified correct patient position, special equipment/implants available, medications/allergies/relevant history reviewed, required imaging and test results available.   Sterile Technique Maximal sterile technique including full sterile barrier drape, hand hygiene, sterile gown, sterile gloves, mask, hair covering, sterile ultrasound probe cover (if used).   Procedure Description Ultrasound used to identify appropriate pleural anatomy for placement and overlying skin marked. Area of placement cleaned and draped in sterile fashion.  A Wayne  pigtail pleural catheter was placed into the right pleural space using Seldinger technique. Appropriate return of air was obtained.  The tube was connected to atrium and placed on -20 cm H2O wall suction.   Complications/Tolerance None; patient tolerated the procedure well. Chest X-ray is ordered to verify placement.   EBL Minimal  Specimen(s) none   Cameron Rymer MD [place] [facility] Please see [email or web address] for pager details.  10/03/2020, 8:44 AM

## 2020-10-03 NOTE — Progress Notes (Signed)
eLink Physician-Brief Progress Note Patient Name: Cameron Hale DOB: [birth year] [name]: [id number]   Date of Service  10/03/2020  HPI/Events of Note  Patient has R pneumothorax on AM CXR. Patient intubated and ventilated. 30%/P 5 with Sat = 100%.  eICU Interventions  Ground team notified of finding of pneumothorax.      Intervention Category Major Interventions: Hypoxemia - evaluation and management  Veronnica Hennings [place] 10/03/2020, 6:50 AM

## 2020-10-03 NOTE — Progress Notes (Signed)
eLink Physician-Brief Progress Note Patient Name: JHAIR WITHERINGTON DOB: Jun 10, 1976 [name]: [id number]   Date of Service  10/03/2020  HPI/Events of Note  Hypotension - BP = 88/46 with MAP = 60. Albumin = 2.1 and LVEF = 60-65%.  eICU Interventions  Plan: 1. 25% Albumin 25 gm IV now.      Intervention Category Major Interventions: Hypotension - evaluation and management  Kolden Dupee [place] 10/03/2020, 5:17 AM

## 2020-10-03 NOTE — TOC Progression Note (Signed)
Transition of Care [facility]) - Progression Note    Patient Details  Name: Cameron Hale [name]: [id number] Date of Birth: 12-05-1975  Transition of Care [facility]) CM/SW Contact  Leeroy Cha, RN Phone Number: 10/03/2020, 8:01 AM  Clinical Narrative:    [place] Hospital Events   Admitted to [facility] 11/1>>11/2 Transferred to [facility] 11/2>>, on pressors. 11/3: Pressors off. Growing K pneumoniae in blood and urine, antibiotics narrowed. EEG obtained. Was neg for seizureTriggered apnea alarm on vent. Fent gtt stopped. Not able to wean  11/4 more awake. On SBT volumes look good. Extubated, then re-intubated 11/5 family conversation: doesn't want tracheostomy, want to give him more time to try to get stronger 11/9- family meeting regarding long term goals  remians vent dependant at 100% [id number] pneumothorax possible chest tube Following for progression.  Expected Discharge Plan: Home/Self Care Barriers to Discharge: Barriers Unresolved (comment) (medical treatment)  Expected Discharge Plan and Services Expected Discharge Plan: Home/Self Care   Discharge Planning Services: CM Consult   Living arrangements for the past 2 months: Single Family Home                                       Social Determinants of Health (SDOH) Interventions    Readmission Risk Interventions Readmission Risk Prevention Plan 09/08/2020 08/17/2020  Transportation Screening Complete Complete  Medication Review Press photographer) Complete Complete  PCP or Specialist appointment within 3-5 days of discharge Complete Complete  HRI or Home Care Consult Complete Complete  SW Recovery Care/Counseling Consult Complete Complete  Palliative Care Screening Complete Complete  [place] Not Applicable Complete  Some recent data might be hidden

## 2020-10-03 NOTE — Progress Notes (Signed)
NAME:  Cameron Hale, [name]:  [id number], DOB:  [birth year], LOS: 55 ADMISSION DATE:  09/21/2020, CONSULTATION DATE:  11/2 REFERRING MD:  Dyann Kief, CHIEF COMPLAINT:  Confusion   Brief History   44 y/o male with quadriplegia and recent intubation admitted from [facility] in the setting of respiratory failure likely from respiratory muscle weakness exacerbated by sepsis of a urinary origin.  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 G-tube (09/05/2020)  Significant Hospital Events   Admitted to [facility] 11/1>>11/2 Transferred to [facility] 11/2>>, on pressors. 11/3: Pressors off.  Growing K pneumoniae in blood and urine, antibiotics narrowed. EEG obtained. Was neg for seizure Triggered apnea alarm on vent. Fent gtt stopped. Not able to wean  11/4 more awake. On SBT volumes look good. Extubated, then re-intubated 11/5 family conversation: doesn't want tracheostomy, want to give him more time to try to get stronger 11/9- family meeting regarding long term goals  11/16 right pneumothorax, chest tube placed   Consults:  PCCM  Procedures:  11/2 ETT > 11/4, 11/4 >  11/16 Chest tube placed for Ptx  Significant Diagnostic Tests:  11/1 CT >> extensive meningiomatosis noted unchanged. Bifrontal edema is stable. No acute intracranial abnormality.  11/3 EEG: no seizure  Micro Data:  11/1 Flu >> negative 11/1 Covid >> negative 11/1 Blood culture >> K pneumoniae 11/1 Urine culture >> K pneumoniae 11/5 resp culture> normal flora  Antimicrobials:  11/1 Cefepime >> 11/2 11/2 Metronidazole >> 11/3 11/2 Vancomycin >> 11/3  Azactam 11/2 >>11/11   Interim history/subjective:   Developed pneumothorax on the right requiring chest tube Remains on the ventilator, mental status still poor  Objective   Blood pressure (!) 95/57, pulse 80,  temperature 98.7 F (37.1 C), temperature source Axillary, resp. rate 12, height 5\' 10"  (1.778 m), weight 72 kg, SpO2 100 %.    Vent Mode: PRVC FiO2 (%):  [30 %-100 %] 100 % Set Rate:  [12 bmp] 12 bmp Vt Set:  [580 mL] 580 mL PEEP:  [5 cmH20] 5 cmH20 Plateau Pressure:  [13 cmH20-17 cmH20] 13 cmH20   Intake/Output Summary (Last 24 hours) at 10/03/2020 0847 Last data filed at 10/03/2020 0615 Gross per 24 hour  Intake 2988.51 ml  Output 800 ml  Net 2188.51 ml   Filed Weights   10/01/20 0334 10/02/20 0425 10/03/20 0500  Weight: 73.8 kg 73.6 kg 72 kg    Examination:  Gen:      No acute distress HEENT:  EOMI, sclera anicteric Neck:     No masses; no thyromegaly, ETT Lungs:    Clear to auscultation bilaterally; normal respiratory effort CV:         Regular rate and rhythm; no murmurs Abd:      + bowel sounds; soft, non-tender; no palpable masses, no distension Ext:    No edema; adequate peripheral perfusion Skin:      Warm and dry; no rash Neuro: Quadriparesis, unresponsive  Labs/imaging personally reviewed Chest x-ray 11/16-moderate right pneumothorax  Resolved Hospital Problem list   Left lung collapse/possible mucus plug due to neuromuscular weakness, made worse in setting of sepsis > resolved  Septic shock Klebsiella pneumonia bacteremia S/p Aztreonam for 11 days  Assessment & Plan:  Right pneumothorax in the setting of mechanical ventilation Chest tube placed.  Placed to suction, follow chest x-ray  Acute respiratory failure with hypoxia Unlikely to be able to protect his  airway secondary to profound weakness -Failed extubation on 11/4 Continue SBT's.  Ongoing goals of care discussion before extubation  Metabolic encephalopathy Quadriplegia Tongue fasciculation unchanged from baseline -As needed fentanyl, goal RASS 0 -Physical therapy  Seizures -Continue Keppra  Anemia -Status post 2 units PRBC this admit Follow CBC.  Transfuse for hemoglobin less than 7.   No active bleed at present.  Protein calorie malnutrition PEG in place Continue tube feeding  Hypoglycemia -SSI -Goal sugar 140-180  Goals of care discussion -ongoing since 11/9 -Mother is very clear-patient had requested that he does not want to go to a nursing home , she feels tracheostomy would worsen his quality of life , he does wean intermittently but main issue is ability to maintain airway, if we proceed with another trial of extubation need to establish no re-intubation status.  Mom is not willing to commit to this. Per discussion over the weekend mother once wife involved.  Awaiting her arrival to continue conversations Inpatient palliative medicine consulted as well. He is followed by [facility] palliative care in the community  Best practice:  Diet: tube feeding Pain/Anxiety/Delirium protocol (if indicated): as above VAP protocol (if indicated): yes DVT prophylaxis: SCD's with thrombocytopenia GI prophylaxis: Pantoprazole  Glucose control: monitor Mobility: bed rest Code Status: full Family Communication: Discussed with mom yesterday 11/15 Disposition: icu  Signature:   The patient is critically ill with multiple organ system failure and requires high complexity decision making for assessment and support, frequent evaluation and titration of therapies, advanced monitoring, review of radiographic studies and interpretation of complex data.   Critical Care Time devoted to patient care services, exclusive of separately billable procedures, described in this note is 35 minutes.   Marshell Garfinkel MD [place] [facility] Please see [email or web address] for pager details.  10/03/2020, 8:47 AM

## 2020-10-04 ENCOUNTER — Inpatient Hospital Stay (HOSPITAL_COMMUNITY): Payer: Medicare Other

## 2020-10-04 DIAGNOSIS — A419 Sepsis, unspecified organism: Secondary | ICD-10-CM | POA: Diagnosis not present

## 2020-10-04 LAB — BASIC METABOLIC PANEL
Anion gap: 10 (ref 5–15)
BUN: 57 mg/dL — ABNORMAL HIGH (ref 6–20)
CO2: 22 mmol/L (ref 22–32)
Calcium: 8.9 mg/dL (ref 8.9–10.3)
Chloride: 107 mmol/L (ref 98–111)
Creatinine, Ser: <0.3 mg/dL — ABNORMAL LOW (ref 0.61–1.24)
Glucose, Bld: 95 mg/dL (ref 70–99)
Potassium: 3.8 mmol/L (ref 3.5–5.1)
Sodium: 139 mmol/L (ref 135–145)

## 2020-10-04 LAB — GLUCOSE, CAPILLARY
Glucose-Capillary: 87 mg/dL (ref 70–99)
Glucose-Capillary: 113 mg/dL — ABNORMAL HIGH (ref 70–99)
Glucose-Capillary: 125 mg/dL — ABNORMAL HIGH (ref 70–99)
Glucose-Capillary: 105 mg/dL — ABNORMAL HIGH (ref 70–99)
Glucose-Capillary: 87 mg/dL (ref 70–99)

## 2020-10-04 LAB — CBC
HCT: 22.8 % — ABNORMAL LOW (ref 39.0–52.0)
Hemoglobin: 7 g/dL — ABNORMAL LOW (ref 13.0–17.0)
MCH: 28.3 pg (ref 26.0–34.0)
MCHC: 30.7 g/dL (ref 30.0–36.0)
MCV: 92.3 fL (ref 80.0–100.0)
Platelets: 448 10*3/uL — ABNORMAL HIGH (ref 150–400)
RBC: 2.47 MIL/uL — ABNORMAL LOW (ref 4.22–5.81)
RDW: 18.3 % — ABNORMAL HIGH (ref 11.5–15.5)
WBC: 9.1 10*3/uL (ref 4.0–10.5)
nRBC: 0 % (ref 0.0–0.2)

## 2020-10-04 LAB — PHOSPHORUS: Phosphorus: 3.1 mg/dL (ref 2.5–4.6)

## 2020-10-04 LAB — MAGNESIUM: Magnesium: 2 mg/dL (ref 1.7–2.4)

## 2020-10-04 MED ORDER — FREE WATER
100.0000 mL | Freq: Four times a day (QID) | Status: DC
  Administered 2020-10-04 – 2020-10-10 (×25): 100 mL

## 2020-10-04 MED ORDER — VITAL AF 1.2 CAL PO LIQD
1000.0000 mL | ORAL | Status: AC
  Administered 2020-10-05 – 2020-10-09 (×5): 1000 mL

## 2020-10-04 MED ORDER — PROSOURCE TF PO LIQD
45.0000 mL | Freq: Two times a day (BID) | ORAL | Status: DC
  Administered 2020-10-04 – 2020-10-10 (×14): 45 mL
  Filled 2020-10-04 (×14): qty 45

## 2020-10-04 MED ORDER — FUROSEMIDE 10 MG/ML IJ SOLN
40.0000 mg | Freq: Two times a day (BID) | INTRAMUSCULAR | Status: DC
  Administered 2020-10-04 – 2020-10-08 (×8): 40 mg via INTRAVENOUS
  Filled 2020-10-04 (×9): qty 4

## 2020-10-04 NOTE — TOC Progression Note (Signed)
Transition of Care [facility]) - Progression Note    Patient Details  Name: Cameron Hale [name]: [id number] Date of Birth: [birth year]  Transition of Care [facility]) CM/SW Contact  Leeroy Cha, RN Phone Number: 10/04/2020, 8:04 AM  Clinical Narrative:    [place] Hospital Events   Admitted to [facility] 11/1>>11/2 Transferred to [facility] 11/2>>, on pressors. 11/3: Pressors off. Growing K pneumoniae in blood and urine, antibiotics narrowed. EEG obtained. Was neg for seizureTriggered apnea alarm on vent. Fent gtt stopped. Not able to wean  11/4 more awake. On SBT volumes look good. Extubated, then re-intubated 11/5 family conversation: doesn't want tracheostomy, want to give him more time to try to get stronger 11/9- family meeting regarding long term goals  11/16 right pneumothorax, chest tube placed Plan is eventually home with tracheostomy.  Has medicare insurance will be able to do Following for progression now has rt chest tube and new pneumothorax.   Expected Discharge Plan: Home/Self Care Barriers to Discharge: Barriers Unresolved (comment) (medical treatment)  Expected Discharge Plan and Services Expected Discharge Plan: Home/Self Care   Discharge Planning Services: CM Consult   Living arrangements for the past 2 months: Single Family Home                                       Social Determinants of Health (SDOH) Interventions    Readmission Risk Interventions Readmission Risk Prevention Plan 09/08/2020 08/17/2020  Transportation Screening Complete Complete  Medication Review Press photographer) Complete Complete  PCP or Specialist appointment within 3-5 days of discharge Complete Complete  HRI or Home Care Consult Complete Complete  SW Recovery Care/Counseling Consult Complete Complete  Palliative Care Screening Complete Complete  [place] Not Applicable Complete  Some recent data might be hidden

## 2020-10-04 NOTE — Progress Notes (Signed)
PMT provider continues to shadow chart. Have not heard back from family regarding meeting time. Family familiar with palliative services from previous admission. Discussed with Dr. Lane Hacker in detail. Family has PMT contact information and will reach out to the team if interested in our support. Will sign off for now. Please do not hesitate to contact PMT if family interested in our involvement in the future. Updated Dr. Vaughan Browner. Thank you.   NO CHARGE  Ihor Dow, [place], FNP-C Palliative Medicine Team  Phone: [phone] Fax: [phone]

## 2020-10-04 NOTE — Progress Notes (Signed)
Physical Therapy Discharge Patient Details Name: Cameron Hale [name]: [id number] DOB: 1976/04/16 Today's Date: 10/04/2020 Time:  -     Patient discharged from PT services secondary to medical decline - will need to re-order PT to resume therapy services. Patient remains on vent, now with CT for pneumothorax.  Patient  Is not able  to participate in  Functional  Gains at this time. Nursing is performing /PROM as tolerated. No further skilled PT intervention.  Please see latest therapy progress note for current level of functioning and progress toward goals.    Notified RN.  GP     Claretha Cooper 10/04/2020, 7:29 AM [place] Pager [phone] Office [phone]

## 2020-10-04 NOTE — Progress Notes (Signed)
Nutrition Follow-up  DOCUMENTATION CODES:   Not applicable  INTERVENTION:  - will adjust TF regimen: Vital AF 1.2 @ 50 ml/hr with 45 ml Prosource TF BID, 1 packet Juven BID, and 100 ml free water QID. - this regimen will provide 1710 kcal, 117 grams protein, and 1373 ml free water.    NUTRITION DIAGNOSIS:   Inadequate oral intake related to inability to eat as evidenced by NPO status -ongoing  GOAL:   Patient will meet greater than or equal to 90% of their needs -met with TF regimen  MONITOR:   Vent status, TF tolerance, Labs, Weight trends, Skin  ASSESSMENT:   44 y.o. male with medical history of impaired hearing, neurofibromatosis 2, neurogenic bladder, pressure ulcer, quadriplegia, seizures, vestibular schwannoma, and cerebral thrombosis with cerebral infarction. He was intubated 9/21-10/22 at [facility]. He presented to the ED due to worsening mental status since 10/29. EMS reported to ED staff that patient was unresponsive to sternal rub initially.  Patient discussed in rounds this AM. He remains Full Code at this time.   Patient remains intubated with PEG in place. He is receiving Vital AF 1.2 @ 60 ml/hr with 1 packet Juven BID and 200 ml free water QID. This regimen is providing 1918 kcal, 113 grams protein, and 1968 ml free water.  Weight trending down with lasix order in place. Mild pitting edema to BUE and non-pitting edema to BLE documented in edema section of flow sheet.    Patient is currently intubated on ventilator support MV: 7.8 L/min Temp (24hrs), Avg:97.8 F (36.6 C), Min:96.8 F (36 C), Max:98.6 F (37 C) Propofol: none BP: 102/58 and MAP: 72    Labs reviewed; CBGs: 87 and 113 mg/dl, BUN: 57 mg/dl, creatinine: <0.3 mg/dl. Medications reviewed; 250 mg ascorbic acid/day, 300 mg ferrous sulfate/day, 40 mg IV lasix BID, sliding scale novolog, 40 mg protonix BID.     Diet Order:   Diet Order            Diet NPO time specified  Diet effective now                  EDUCATION NEEDS:   No education needs have been identified at this time  Skin:  Skin Assessment: Skin Integrity Issues: Skin Integrity Issues:: Stage I, Stage II, Stage III, Other (Comment) Stage I: R elbow; R heel Stage II: L thigh Stage III: sacrum Other: MASD to scrotum  Last BM:  11/17 (type 7 x1)  Height:   Ht Readings from Last 1 Encounters:  09/23/20 '5\' 10"'  (1.778 m)    Weight:   Wt Readings from Last 1 Encounters:  10/04/20 66.5 kg     Estimated Nutritional Needs:  Kcal:  1706 kcal Protein:  105-130 g Fluid:  >/= 2 L/day     Jarome Matin, MS, RD, LDN, CNSC Inpatient Clinical Dietitian RD pager # available in [place]  After hours/weekend pager # available in [facility]

## 2020-10-04 NOTE — Progress Notes (Signed)
NAME:  Cameron Hale, [name]:  [id number], DOB:  22-Dec-1975, LOS: 40 ADMISSION DATE:  09/20/2020, CONSULTATION DATE:  11/2 REFERRING MD:  Dyann Kief, CHIEF COMPLAINT:  Confusion   Brief History   44 y/o male with quadriplegia and recent intubation admitted from [facility] in the setting of respiratory failure likely from respiratory muscle weakness exacerbated by sepsis of a urinary origin.  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 G-tube (09/05/2020)  Significant Hospital Events   Admitted to [facility] 11/1>>11/2 Transferred to [facility] 11/2>>, on pressors. 11/3: Pressors off.  Growing K pneumoniae in blood and urine, antibiotics narrowed. EEG obtained. Was neg for seizure Triggered apnea alarm on vent. Fent gtt stopped. Not able to wean  11/4 more awake. On SBT volumes look good. Extubated, then re-intubated 11/5 family conversation: doesn't want tracheostomy, want to give him more time to try to get stronger 11/9- family meeting regarding long term goals  11/16 right pneumothorax, chest tube placed   Consults:  PCCM  Procedures:  11/2 ETT > 11/4, 11/4 >  11/16 Chest tube placed for Ptx  Significant Diagnostic Tests:  11/1 CT >> extensive meningiomatosis noted unchanged. Bifrontal edema is stable. No acute intracranial abnormality.  11/3 EEG: no seizure  Micro Data:  11/1 Flu >> negative 11/1 Covid >> negative 11/1 Blood culture >> K pneumoniae 11/1 Urine culture >> K pneumoniae 11/5 resp culture> normal flora  Antimicrobials:  11/1 Cefepime >> 11/2 11/2 Metronidazole >> 11/3 11/2 Vancomycin >> 11/3  Azactam 11/2 >>11/11   Interim history/subjective:   Remains on the ventilator with no acute issues. Not weaning  Objective   Blood pressure (!) 102/58, pulse 87, temperature 98.6 F (37 C), temperature source  Axillary, resp. rate 15, height 5\' 10"  (1.778 m), weight 66.5 kg, SpO2 100 %.    Vent Mode: PRVC FiO2 (%):  [100 %] 100 % Set Rate:  [12 bmp] 12 bmp Vt Set:  [580 mL] 580 mL PEEP:  [5 cmH20] 5 cmH20 Plateau Pressure:  [14 cmH20-15 cmH20] 14 cmH20   Intake/Output Summary (Last 24 hours) at 10/04/2020 0935 Last data filed at 10/04/2020 0800 Gross per 24 hour  Intake 1728.89 ml  Output 1470 ml  Net 258.89 ml   Filed Weights   10/02/20 0425 10/03/20 0500 10/04/20 0104  Weight: 73.6 kg 72 kg 66.5 kg    Examination:  Gen:      No acute distress HEENT:  EOMI, sclera anicteric Neck:     No masses; no thyromegaly, ETT Lungs:    Clear to auscultation bilaterally; normal respiratory effort CV:         Regular rate and rhythm; no murmurs Abd:      + bowel sounds; soft, non-tender; no palpable masses, no distension Ext:    No edema; adequate peripheral perfusion Skin:      Warm and dry; no rash Neuro: Quadriparesis, unresponsive  Labs/imaging personally reviewed Chest x-ray today reviewed 11/17 with right chest tube in place, no pneumothorax.  Resolved Hospital Problem list   Left lung collapse/possible mucus plug due to neuromuscular weakness, made worse in setting of sepsis > resolved  Septic shock Klebsiella pneumonia bacteremia S/p Aztreonam for 11 days  Assessment & Plan:  Right pneumothorax in the setting of mechanical ventilation Chest tube to suction, follow daily chest x-ray  Acute respiratory failure with hypoxia Unlikely to be able to protect his airway  secondary to profound weakness -Failed extubation on 11/4 Continue SBT's.  Ongoing goals of care discussion before extubation  Metabolic encephalopathy Quadriplegia Tongue fasciculation unchanged from baseline -As needed fentanyl, goal RASS 0 -Physical therapy  Seizures -Continue Keppra  Anemia -Status post 2 units PRBC this admit Follow CBC.  Transfuse for hemoglobin less than 7.  No active bleed at  present.  Protein calorie malnutrition PEG in place Continue tube feeding  Hypoglycemia -SSI -Goal sugar 140-180  Goals of care discussion -ongoing since 11/9 -Mother is very clear-patient had requested that he does not want to go to a nursing home , she feels tracheostomy would worsen his quality of life , he does wean intermittently but main issue is ability to maintain airway, if we proceed with another trial of extubation need to establish no re-intubation status.  Mom is not willing to commit to this. Per discussion over the weekend mother once wife involved.  Awaiting her arrival to continue conversations Inpatient palliative medicine consulted but family declined to meet He is followed by [place] palliative care in the community  Discussed with mom again on 11/17. Family is having difficulty making the final decision on one-way extubation with DNR  Best practice:  Diet: tube feeding Pain/Anxiety/Delirium protocol (if indicated): as above VAP protocol (if indicated): yes DVT prophylaxis: SCD's with thrombocytopenia GI prophylaxis: Pantoprazole  Glucose control: monitor Mobility: bed rest Code Status: full Family Communication: See above Disposition: icu  Signature:   The patient is critically ill with multiple organ system failure and requires high complexity decision making for assessment and support, frequent evaluation and titration of therapies, advanced monitoring, review of radiographic studies and interpretation of complex data.   Critical Care Time devoted to patient care services, exclusive of separately billable procedures, described in this note is 35 minutes.   Marshell Garfinkel MD [place] [facility] Please see [email or web address] for pager details.  10/04/2020, 9:35 AM

## 2020-10-05 ENCOUNTER — Inpatient Hospital Stay (HOSPITAL_COMMUNITY): Payer: Medicare Other

## 2020-10-05 DIAGNOSIS — J939 Pneumothorax, unspecified: Secondary | ICD-10-CM

## 2020-10-05 DIAGNOSIS — J9600 Acute respiratory failure, unspecified whether with hypoxia or hypercapnia: Secondary | ICD-10-CM

## 2020-10-05 LAB — GLUCOSE, CAPILLARY
Glucose-Capillary: 101 mg/dL — ABNORMAL HIGH (ref 70–99)
Glucose-Capillary: 124 mg/dL — ABNORMAL HIGH (ref 70–99)
Glucose-Capillary: 129 mg/dL — ABNORMAL HIGH (ref 70–99)
Glucose-Capillary: 76 mg/dL (ref 70–99)
Glucose-Capillary: 81 mg/dL (ref 70–99)
Glucose-Capillary: 93 mg/dL (ref 70–99)

## 2020-10-05 LAB — MAGNESIUM: Magnesium: 2 mg/dL (ref 1.7–2.4)

## 2020-10-05 LAB — CBC
HCT: 23 % — ABNORMAL LOW (ref 39.0–52.0)
Hemoglobin: 7.2 g/dL — ABNORMAL LOW (ref 13.0–17.0)
MCH: 28.9 pg (ref 26.0–34.0)
MCHC: 31.3 g/dL (ref 30.0–36.0)
MCV: 92.4 fL (ref 80.0–100.0)
Platelets: 462 10*3/uL — ABNORMAL HIGH (ref 150–400)
RBC: 2.49 MIL/uL — ABNORMAL LOW (ref 4.22–5.81)
RDW: 18.2 % — ABNORMAL HIGH (ref 11.5–15.5)
WBC: 8.3 10*3/uL (ref 4.0–10.5)
nRBC: 0 % (ref 0.0–0.2)

## 2020-10-05 LAB — BASIC METABOLIC PANEL
Anion gap: 13 (ref 5–15)
BUN: 73 mg/dL — ABNORMAL HIGH (ref 6–20)
CO2: 23 mmol/L (ref 22–32)
Calcium: 9.1 mg/dL (ref 8.9–10.3)
Chloride: 105 mmol/L (ref 98–111)
Creatinine, Ser: <0.3 mg/dL — ABNORMAL LOW (ref 0.61–1.24)
Glucose, Bld: 88 mg/dL (ref 70–99)
Potassium: 4.3 mmol/L (ref 3.5–5.1)
Sodium: 141 mmol/L (ref 135–145)

## 2020-10-05 LAB — PHOSPHORUS: Phosphorus: 4.6 mg/dL (ref 2.5–4.6)

## 2020-10-05 MED ORDER — MORPHINE SULFATE (PF) 2 MG/ML IV SOLN
2.0000 mg | INTRAVENOUS | Status: DC | PRN
  Administered 2020-10-05: 2 mg via INTRAVENOUS
  Filled 2020-10-05: qty 1

## 2020-10-05 NOTE — Progress Notes (Signed)
[facility] ([facility]) Community Based Palliative Care       This patient is enrolled in our palliative care services in the community.  [facility] will continue to follow for any discharge planning needs and to coordinate continuation of palliative care.   If you have questions or need assistance, please call [phone] or contact the hospital Liaison listed on AMION.     Thank you for the opportunity to participate in this patient's care.     Chrislyn King, BSN, RN [facility] Hospital Liaison   [phone]   

## 2020-10-05 NOTE — Progress Notes (Addendum)
Chaplain Naiomy Watters engaged in initial visit with Cameron Hale's Hale.  During visit, chaplain learned about Cameron Hale and Cameron health journey.  Mom verbalized that her and Cameron Hale had been able to discuss what he would want if he was ever unable to speak for himself.  Cameron Hale was able to convey to mom that he never wanted to be in a nursing home and that he never wanted machines keeping him alive long-term.  Mom expressed how difficult it has been to see her son in Cameron current condition and also make such life-changing decisions that may not include Cameron Hale being physically here any longer.  Mom and Cameron Hale are very close.  Mom has been taking care of him at home since the decline in Cameron health.  She also noted that they did not go a day without seeing or talking to each other even when Cameron Hale got married.  Chaplain could assess the closeness and love that Cameron Hale's mom holds for him.    Mom had theological questions about why God allows such bad things to happen to good people.  Chaplain talked with mom about suffering and God's promises for those that are going through.  Chaplain offered prayer with mom and for her to obtain clarity on the decisions she has to make for Cameron Hale.  Chaplain affirmed mom's ability to know what will be right for her son.    Chaplain offered the ministries of presence, listening, and prayer.    10/05/20 1200  Clinical Encounter Type  Visited With Patient and family together  Visit Type Initial  Spiritual Encounters  Spiritual Needs Prayer;Emotional;Grief support  Stress Factors  Family Stress Factors Major life changes;Health changes

## 2020-10-05 NOTE — Procedures (Signed)
Extubation Procedure Note  Patient Details:   Name: Cameron Hale DOB: [birth year] [name]: [id number]   Airway Documentation:    Vent end date: 10/05/20 Vent end time: 1826   Evaluation  O2 sats: stable throughout Complications: No apparent complications Patient did tolerate procedure well. Bilateral Breath Sounds: Diminished   No  Extubate to comfort care  Johnette Abraham 10/05/2020, 6:26 PM

## 2020-10-05 NOTE — Progress Notes (Addendum)
NAME:  Cameron TAPPAN, [name]:  [id number], DOB:  19-Feb-1976, LOS: 41 ADMISSION DATE:  10/02/2020, CONSULTATION DATE:  11/2 REFERRING MD:  Dyann Kief, CHIEF COMPLAINT:  Confusion   Brief History   44 y/o male with quadriplegia and recent intubation admitted from [facility] in the setting of respiratory failure likely from respiratory muscle weakness exacerbated by sepsis of a urinary origin.  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 G-tube (09/05/2020)  Significant Hospital Events   11/01 Admitted to [facility]   11/02 Transferred to [facility], on pressors. 11/03 Pressors off.  Growing K pneumoniae in blood and urine, antibiotics narrowed. EEG obtained. Neg for seizure. Triggered apnea alarm on vent. Fent gtt stopped. Not able to wean  11/04 more awake. On SBT volumes look good. Extubated, then re-intubated 11/05 family conversation: doesn't want tracheostomy, want to give him more time to try to get stronger 11/09 family meeting regarding long term goals  11/16 right pneumothorax, chest tube placed  Consults:  PCCM  Procedures:  ETT 11/2 >> 11/4, 11/4 >  R Anterior Chest Tube 11/16 >>  Significant Diagnostic Tests:  11/1 CT Head >> extensive meningiomatosis noted unchanged. Bifrontal edema is stable. No acute abnormality.  11/3 EEG >> no seizure  Micro Data:  COVID 11/1 >> negative  Influenza 11/1 >> negative  BCx2 11/1 >> Klebsiella pneumoniae >> S-ceftazidime, ceftriaxone. R-ampicilling, cefazolin, bactrim, unasyn UC 11/1 >> Klebsiella pneumoniae >> S-cefaxolin, cefepime. I-nitrofurantion. R-ampicillin, bactrim Tracheal aspirate 11/5 >> normal flora   Antimicrobials:  Cefepime 11/1 >> 11/2 Metronidazole 11/2 >> 11/3 Vancomycin 11/2 >> 11/3 Azactam 11/2 >> 11/11   Interim history/subjective:  Afebrile / WBC 8.3  Remains on  vent, no weaning.  PEEP 5 / FiO2 30% Glucose range 76-113 I/O 3.1L UOP, -1.2L in last 24 hours   Objective   Blood pressure (!) 87/53, pulse 73, temperature (!) 97.4 F (36.3 C), temperature source Axillary, resp. rate 12, height 5\' 10"  (1.778 m), weight 69.6 kg, SpO2 100 %.    Vent Mode: PRVC FiO2 (%):  [30 %-40 %] 30 % Set Rate:  [12 bmp] 12 bmp Vt Set:  [580 mL] 580 mL PEEP:  [5 cmH20] 5 cmH20 Pressure Support:  [10 cmH20] 10 cmH20 Plateau Pressure:  [13 cmH20-15 cmH20] 15 cmH20   Intake/Output Summary (Last 24 hours) at 10/05/2020 0272 Last data filed at 10/05/2020 5366 Gross per 24 hour  Intake 1954.4 ml  Output 3350 ml  Net -1395.6 ml   Filed Weights   10/03/20 0500 10/04/20 0104 10/05/20 0500  Weight: 72 kg 66.5 kg 69.6 kg    Examination:  General: adult male lying in bed in NAD on vent HEENT: MM pink/moist, ETT, temporal wasting  Neuro: opens eyes to voice, weakness and atrophy in all four extremities  CV: s1s2 RRR, no m/r/g PULM: non-labored on vent, lungs bilaterally clear, anterior chest tube in place without air leak  GI: soft, bsx4 active  Extremities: warm/dry, generalized trace edema  Skin: no rashes or lesions  PCXR 11/18 >> images personally reviewed, right chest tube in good position, no pneumothorax  Resolved Hospital Problem list   Left lung collapse/possible mucus plug due to neuromuscular weakness, made worse in setting of sepsis  Septic shock Klebsiella pneumonia bacteremia -s/p Aztreonam for 11 days  Assessment & Plan:   Right Spontaneous Pneumothorax  In the setting of mechanical ventilation -chest tube care  per protocol  -continue chest tube to -20 cm suction for now -follow intermittent CXR   Acute Respiratory Failure with Hypoxia Suspect he will be able to protect his airway secondary to profound weakness related to NF II, failed extubation 11/4 -PRVC 8cc/kg  -continue SBT trials as able  -will need to clarify with family regarding  reintubation if patient fails  Metabolic Encephalopathy Quadriplegia secondary to Neurofibromatosis II Tongue fasciculation unchanged from baseline -PRN fentanyl  -RASS goal 0  -PT as able / passive ROM  -continue decadron  Seizures -continue keppra -seizure precautions   Elevated BUN  -monitor with lasix administration   Anemia S/p 2 units PRBC during admit  -trend CBC  -transfuse for Hgb <7%  Protein calorie malnutrition PEG Status -continue TF   Hypoglycemia -SSI  -goal glucose 140-180   Goals of care discussion -ongoing since 11/9 -Mother is very clear-patient had requested that he does not want to go to a nursing home, she feels tracheostomy would worsen his quality of life, he does wean intermittently but main issue is ability to maintain airway, if we proceed with another trial of extubation need to establish no re-intubation status.  Mom is not willing to commit to this. Per discussion over the weekend mother wants wife involved.  Awaiting her arrival to continue conversations Inpatient palliative medicine consulted but family declined to meet He is followed by [place] palliative care in the community.  Discussed with mom again on 11/17. Family is having difficulty making the final decision on one-way extubation with DNR.  Will follow up 11/18.    Best practice:  Diet: TF Pain/Anxiety/Delirium protocol (if indicated): as above VAP protocol (if indicated): yes DVT prophylaxis: SCD's   GI prophylaxis: Pantoprazole  Glucose control: monitor Mobility: BR Code Status: Full Code  Family Communication: See above Disposition: ICU  CC Time: 54 minutes    [name], MSN, NP-C, AGACNP-BC [place] [facility] 10/05/2020, 9:46 AM   Please see [email or web address] for pager details.

## 2020-10-05 NOTE — Progress Notes (Signed)
PCCM progress note  Discussed with family.  They would like to proceed with one-way extubation with DNR Hoping for the best but if he has respiratory distress then will use morphine as needed.  Marshell Garfinkel MD [place] [facility] Please see [email or web address] for pager details.  10/05/2020, 6:31 PM

## 2020-10-05 NOTE — Plan of Care (Addendum)
  Interdisciplinary Goals of Care Family Meeting   Date carried out:: 10/05/2020  Location of the meeting: Bedside  Member's involved: Nurse Practitioner, Bedside Registered Nurse and Family Member or next of kin  Durable Power of Attorney or acting medical decision maker: Wife, mother, sisters x2 at bedside for discussion.   Discussion: We discussed goals of care for Cameron Hale .  Reviewed the nature of [name]'s chronic disease and that it is incurable.  We discussed his hx since March and the slow decline that he has experienced. We also discussed that the ventilator is a tool used to offer a bridge to healing.  However, in [name]'s case the underlying disease process can not be reversed and we have been supporting him with a tool that is now causing potential harm (he recently suffered a pneumothorax from positive pressure ventilation and required a chest tube).  Family is aware that his quality of life is limited and he has stated that he did not want to be hooked up to machines in a facility.  Recommended compassionate extubation and no reintubation to family.  They are going to discuss and follow up with Dr. Vaughan Browner this evening. Offered support to family.   Code status: Full Code  Disposition: Continue current acute care  Time spent for the meeting: 57 minutes    [name], MSN, NP-C, AGACNP-BC  [facility]ulmonary [facility] Critical [facility] 10/05/2020, 4:18 PM   Please see [email or web address] for pager details.

## 2020-10-06 ENCOUNTER — Inpatient Hospital Stay (HOSPITAL_COMMUNITY): Payer: Medicare Other

## 2020-10-06 DIAGNOSIS — A419 Sepsis, unspecified organism: Secondary | ICD-10-CM | POA: Diagnosis not present

## 2020-10-06 LAB — GLUCOSE, CAPILLARY
Glucose-Capillary: 101 mg/dL — ABNORMAL HIGH (ref 70–99)
Glucose-Capillary: 111 mg/dL — ABNORMAL HIGH (ref 70–99)
Glucose-Capillary: 115 mg/dL — ABNORMAL HIGH (ref 70–99)
Glucose-Capillary: 130 mg/dL — ABNORMAL HIGH (ref 70–99)
Glucose-Capillary: 145 mg/dL — ABNORMAL HIGH (ref 70–99)
Glucose-Capillary: 67 mg/dL — ABNORMAL LOW (ref 70–99)
Glucose-Capillary: 92 mg/dL (ref 70–99)

## 2020-10-06 LAB — CBC
HCT: 23.5 % — ABNORMAL LOW (ref 39.0–52.0)
Hemoglobin: 7.2 g/dL — ABNORMAL LOW (ref 13.0–17.0)
MCH: 28.6 pg (ref 26.0–34.0)
MCHC: 30.6 g/dL (ref 30.0–36.0)
MCV: 93.3 fL (ref 80.0–100.0)
Platelets: 477 10*3/uL — ABNORMAL HIGH (ref 150–400)
RBC: 2.52 MIL/uL — ABNORMAL LOW (ref 4.22–5.81)
RDW: 17.7 % — ABNORMAL HIGH (ref 11.5–15.5)
WBC: 9.2 10*3/uL (ref 4.0–10.5)
nRBC: 0 % (ref 0.0–0.2)

## 2020-10-06 LAB — BASIC METABOLIC PANEL
Anion gap: 13 (ref 5–15)
BUN: 92 mg/dL — ABNORMAL HIGH (ref 6–20)
CO2: 27 mmol/L (ref 22–32)
Calcium: 9.3 mg/dL (ref 8.9–10.3)
Chloride: 105 mmol/L (ref 98–111)
Creatinine, Ser: 0.3 mg/dL — ABNORMAL LOW (ref 0.61–1.24)
GFR, Estimated: >60 mL/min (ref >60)
Glucose, Bld: 79 mg/dL (ref 70–99)
Potassium: 4.2 mmol/L (ref 3.5–5.1)
Sodium: 145 mmol/L (ref 135–145)

## 2020-10-06 MED ORDER — MORPHINE SULFATE (PF) 2 MG/ML IV SOLN
2.0000 mg | INTRAVENOUS | Status: DC | PRN
  Administered 2020-10-07: 2 mg via INTRAVENOUS
  Filled 2020-10-06: qty 1

## 2020-10-06 MED ORDER — LIP MEDEX EX OINT
TOPICAL_OINTMENT | CUTANEOUS | Status: DC | PRN
  Administered 2020-10-10: 1 via TOPICAL
  Filled 2020-10-06: qty 7

## 2020-10-06 NOTE — Progress Notes (Addendum)
NAME:  DEMONTE DOBRATZ, [name]:  [id number], DOB:  09-Jun-1976, LOS: 32 ADMISSION DATE:  09/30/2020, CONSULTATION DATE:  11/2 REFERRING MD:  Dyann Kief, CHIEF COMPLAINT:  Confusion   Brief History   44 y/o male with quadriplegia and recent intubation admitted from [facility] in the setting of respiratory failure likely from respiratory muscle weakness exacerbated by sepsis of a urinary origin.  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 G-tube (09/05/2020)  Significant Hospital Events   11/01 Admitted to [facility]   11/02 Transferred to [facility], on pressors. 11/03 Pressors off.  Growing K pneumoniae in blood and urine, antibiotics narrowed. EEG obtained. Neg for seizure. Triggered apnea alarm on vent. Fent gtt stopped. Not able to wean  11/04 more awake. On SBT volumes look good. Extubated, then re-intubated 11/05 family conversation: doesn't want tracheostomy, want to give him more time to try to get stronger 11/09 family meeting regarding long term goals  11/16 right pneumothorax, chest tube placed 11/18 Made DNR with one way extubation.   Consults:  PCCM  Procedures:  ETT 11/2 >> 11/4, 11/4 >  R Anterior Chest Tube 11/16 >>  Significant Diagnostic Tests:  11/1 CT Head >> extensive meningiomatosis noted unchanged. Bifrontal edema is stable. No acute abnormality.  11/3 EEG >> no seizure  Micro Data:  COVID 11/1 >> negative  Influenza 11/1 >> negative  BCx2 11/1 >> Klebsiella pneumoniae >> S-ceftazidime, ceftriaxone. R-ampicilling, cefazolin, bactrim, unasyn UC 11/1 >> Klebsiella pneumoniae >> S-cefaxolin, cefepime. I-nitrofurantion. R-ampicillin, bactrim Tracheal aspirate 11/5 >> normal flora    Antimicrobials:  Cefepime 11/1 >> 11/2 Metronidazole 11/2 >> 11/3 Vancomycin 11/2 >> 11/3 Azactam 11/2 >> 11/11   Interim  history/subjective:  Step-father at bedside RN reports difficulty obtaining temperature, BP soft  Remains on 6L Moodus Glucose range 79-115 UOP 1.5L, -559ml in last 24 hours   Objective   Blood pressure (!) 83/46, pulse 63, temperature 98 F (36.7 C), temperature source Oral, resp. rate 18, height 5\' 10"  (1.778 m), weight 69.6 kg, SpO2 100 %.    Vent Mode: PRVC FiO2 (%):  [30 %] 30 % Set Rate:  [12 bmp] 12 bmp Vt Set:  [580 mL] 580 mL PEEP:  [5 cmH20] 5 cmH20 Plateau Pressure:  [12 cmH20] 12 cmH20   Intake/Output Summary (Last 24 hours) at 10/06/2020 0911 Last data filed at 10/06/2020 0800 Gross per 24 hour  Intake 1062.86 ml  Output 1400 ml  Net -337.14 ml   Filed Weights   10/03/20 0500 10/04/20 0104 10/05/20 0500  Weight: 72 kg 66.5 kg 69.6 kg    Examination:  General: chronically ill appearing adult male lying in bed in NAD, step-father at bedside   HEENT: MM pink/moist, fair dentition, mouth open / face without distress  Neuro: does not wake to voice or stimulation  CV: s1s2 RRR, no m/r/g PULM: shallow, non-labored on  O2, lungs bilaterally  GI: soft, bsx4 active  Extremities: warm/dry, generalized trace to 1+ edema  Skin: no rashes or lesions on exposed skin  Resolved Hospital Problem list   Left lung collapse/possible mucus plug due to neuromuscular weakness, made worse in setting of sepsis  Septic shock Klebsiella pneumonia bacteremia -s/p Aztreonam for 11 days  Assessment & Plan:   Right Spontaneous Pneumothorax  In the setting of mechanical ventilation -continue chest tube care per protocol  -chest tube to -20cm suction. Consider water seal 11/20.  -  follow daily CXR while CT in place  Acute Respiratory Failure with Hypoxia Suspect he will be able to protect his airway secondary to profound weakness related to NF II, failed extubation 11/4 -O2 for comfort  -No reintubation if fails  -frequent turning, HOB elevated  Metabolic  Encephalopathy Quadriplegia secondary to Neurofibromatosis II Tongue fasciculation unchanged from baseline -continue decadron  -consider comfort medications if noted distress / work of breathing   Seizures -continue keppra  Elevated BUN  -continue lasix, monitor   Anemia S/p 2 units PRBC during admit  -follow CBC intermittently   Protein calorie malnutrition PEG Status -continue TF    Hypoglycemia -SSI   Goals of care discussion  -Extubated 11/18 with no plan for reintubation. Continue current efforts for now, no escalation of care. PRN morphine for work of breathing / agitation. Will need to work toward removal of chest tube.   Best practice:  Diet: TF Pain/Anxiety/Delirium protocol (if indicated): as above VAP protocol (if indicated): yes DVT prophylaxis: SCD's   GI prophylaxis: Pantoprazole  Glucose control: monitor Mobility: BR Code Status: DNR. No reintubation. No escalation of care.  Family Communication: See above. Step-father updated at bedside 11/19. Will follow up with Mother and sister. Disposition: ICU  CC Time: n/a minutes    [name], MSN, NP-C, AGACNP-BC [place] [facility] 10/06/2020, 9:11 AM   Please see [email or web address] for pager details.

## 2020-10-06 NOTE — Plan of Care (Cosign Needed Addendum)
Stepfather updated early a.m.  Followed up with sister, wife and stepfather at bedside.  Patient was extubated late p.m. on 11/18.  Mother stayed overnight with patient.  We reviewed [name]'s current state in detail.  Family asked question of "where to go from here"?  Based on what family indicates, mom is hopeful that he will have some improvement in mental status and be able to return home.  We discussed that she would not be accepting of any type of hospice care in the home.  They would be okay with Aaron Edelman waking and returning home to continue his prior regimen (although, this is not likely).  However if he does not have improvement in mental status they believe that he should remain inpatient for dying process.  Family indicates that they do not believe that his mother would be accepting of residential hospice care.  We reviewed that we will work toward trying to remove the chest tube.  We also discussed that this is yet another change in [name]'s clinical status as at this point he has not shown any evidence of being alert/interactive with family.  We discussed continued ongoing supportive care with no escalation of artificial support and use of medications if Aaron Edelman exhibits evidence of discomfort.   Time spent with family: 24 minutes   [name], MSN, NP-C, AGACNP-BC [place] [facility] 10/06/2020, 1:20 PM   Please see [email or web address] for pager details.
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
## 2020-10-06 NOTE — Plan of Care (Signed)
  Problem: Nutrition: Goal: Adequate nutrition will be maintained Outcome: Progressing   Problem: Safety: Goal: Ability to remain free from injury will improve Outcome: Progressing   

## 2020-10-06 NOTE — Progress Notes (Signed)
Family was at bedside talking to patient and asked if he was listening to them.  He responded, " I ain't listening to y"all."  Family wanted this noted.

## 2020-10-06 NOTE — Progress Notes (Signed)
Patient's CBG, 67. Patient alert with no changes in mentation. 4oz of juice given via G-tube. TF continued. CBG 111 on recheck.

## 2020-10-06 NOTE — TOC Progression Note (Addendum)
Transition of Care [facility]) - Progression Note    Patient Details  Name: LAMOUNT BANKSON [name]: [id number] Date of Birth: May 17, 1976  Transition of Care [facility]) CM/SW Contact  Leeroy Cha, RN Phone Number: 10/06/2020, 8:45 AM  Clinical Narrative:    Extubated on [id number] to 6l/[place] one way extubation. Pt is full dnr Iv decadron,tube feeds, hgb 7.2 Following for plan of care    Expected Discharge Plan: Home/Self Care Barriers to Discharge: Barriers Unresolved (comment) (medical treatment)  Expected Discharge Plan and Services Expected Discharge Plan: Home/Self Care   Discharge Planning Services: CM Consult   Living arrangements for the past 2 months: Single Family Home                                       Social Determinants of Health (SDOH) Interventions    Readmission Risk Interventions Readmission Risk Prevention Plan 09/08/2020 08/17/2020  Transportation Screening Complete Complete  Medication Review Press photographer) Complete Complete  PCP or Specialist appointment within 3-5 days of discharge Complete Complete  HRI or Home Care Consult Complete Complete  SW Recovery Care/Counseling Consult Complete Complete  Palliative Care Screening Complete Complete  [place] Not Applicable Complete  Some recent data might be hidden

## 2020-10-07 ENCOUNTER — Inpatient Hospital Stay (HOSPITAL_COMMUNITY): Payer: Medicare Other

## 2020-10-07 DIAGNOSIS — J9600 Acute respiratory failure, unspecified whether with hypoxia or hypercapnia: Secondary | ICD-10-CM | POA: Diagnosis not present

## 2020-10-07 DIAGNOSIS — Z7189 Other specified counseling: Secondary | ICD-10-CM | POA: Diagnosis not present

## 2020-10-07 DIAGNOSIS — J9312 Secondary spontaneous pneumothorax: Secondary | ICD-10-CM | POA: Diagnosis not present

## 2020-10-07 DIAGNOSIS — A419 Sepsis, unspecified organism: Secondary | ICD-10-CM | POA: Diagnosis not present

## 2020-10-07 LAB — GLUCOSE, CAPILLARY
Glucose-Capillary: 104 mg/dL — ABNORMAL HIGH (ref 70–99)
Glucose-Capillary: 103 mg/dL — ABNORMAL HIGH (ref 70–99)
Glucose-Capillary: 106 mg/dL — ABNORMAL HIGH (ref 70–99)
Glucose-Capillary: 127 mg/dL — ABNORMAL HIGH (ref 70–99)
Glucose-Capillary: 142 mg/dL — ABNORMAL HIGH (ref 70–99)

## 2020-10-07 NOTE — Progress Notes (Signed)
NAME:  Cameron Hale, [name]:  [id number], DOB:  Apr 08, 1976, LOS: 13 ADMISSION DATE:  10/13/2020, CONSULTATION DATE:  11/2 REFERRING MD:  Dyann Kief, CHIEF COMPLAINT:  Confusion   Brief History   44 y/o male with quadriplegia and recent intubation admitted from [facility] in the setting of respiratory failure likely from respiratory muscle weakness exacerbated by sepsis of a urinary origin.  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 G-tube (09/05/2020)  Significant Hospital Events   11/01 Admitted to [facility]   11/02 Transferred to [facility], on pressors. 11/03 Pressors off.  Growing K pneumoniae in blood and urine, antibiotics narrowed. EEG obtained. Neg for seizure. Triggered apnea alarm on vent. Fent gtt stopped. Not able to wean  11/04 more awake. On SBT volumes look good. Extubated, then re-intubated 11/05 family conversation: doesn't want tracheostomy, want to give him more time to try to get stronger 11/09 family meeting regarding long term goals  11/16 right pneumothorax, chest tube placed 11/18 Made DNR with one way extubation.   Consults:  PCCM  Procedures:  ETT 11/2 >> 11/4, 11/4 >  R Anterior Chest Tube 11/16 >>  Significant Diagnostic Tests:  11/1 CT Head >> extensive meningiomatosis noted unchanged. Bifrontal edema is stable. No acute abnormality.  11/3 EEG >> no seizure  Micro Data:  COVID 11/1 >> negative  Influenza 11/1 >> negative  BCx2 11/1 >> Klebsiella pneumoniae >> S-ceftazidime, ceftriaxone. R-ampicilling, cefazolin, bactrim, unasyn UC 11/1 >> Klebsiella pneumoniae >> S-cefaxolin, cefepime. I-nitrofurantion. R-ampicillin, bactrim Tracheal aspirate 11/5 >> normal flora    Antimicrobials:  Cefepime 11/1 >> 11/2 Metronidazole 11/2 >> 11/3 Vancomycin 11/2 >> 11/3 Azactam 11/2 >> 11/11   Interim  history/subjective:  Woke up some overnight, was talking occasionally, able to track with his eye. More sleepy again this morning.  Objective   Blood pressure 95/60, pulse 71, temperature (!) 96 F (35.6 C), temperature source Axillary, resp. rate 20, height 5\' 10"  (1.778 m), weight 65.3 kg, SpO2 99 %.        Intake/Output Summary (Last 24 hours) at 10/07/2020 1125 Last data filed at 10/07/2020 0900 Gross per 24 hour  Intake 1499.58 ml  Output 1950 ml  Net -450.42 ml   Filed Weights   10/04/20 0104 10/05/20 0500 10/07/20 0500  Weight: 66.5 kg 69.6 kg 65.3 kg    Examination:  General: young man laying in bed in NAD HEENT: [place]/AT, eyes anicteric, oral mucosa moist. Small wound on lower lip. Neuro: minimally arousable during exam, RASS -2 CV: S1S2. RRR PULM: no tachypnea or accessory muscle use, minimal breath sounds bilaterally. Chest tube in place- no drainage, tidaling, or air leak. GI: soft, NT, +BS Extremities: warm, dry, no cyanosis or clubbing Skin: pallor, no rashes  CXR personally reviewed: chest tube in appropriate position, no pneumothorax recurrence  Resolved Hospital Problem list   Left lung collapse/possible mucus plug due to neuromuscular weakness, made worse in setting of sepsis  Septic shock Klebsiella pneumonia bacteremia -s/p Aztreonam for 11 days  Assessment & Plan:   Right spontaneous pneumothorax due to MV -stable on CXR today; remove pigtail tube -chest tube to -20cm suction. Consider water seal 11/20.  -follow daily CXR while CT in place  Acute respiratory failure with hypoxia Suspect he will be able to protect his airway secondary to profound weakness related to NF II, failed extubation 11/4 -O2 for comfort  -No reintubation if  fails  -frequent turning, HOB elevated  Metabolic Encephalopathy Quadriplegia secondary to Neurofibromatosis II Tongue fasciculation unchanged from baseline -continue decadron  -will address discomfort with morphine  if noted distress / work of breathing . Family feels that he is comfortable currently.  Seizures due to chronic neurofibromatosis Unilateral blindness and deafness -continue keppra  azotemia -continue lasix, monitor   Anemia S/p 2 units PRBC during admit  -intermittent CBC  Protein calorie malnutrition PEG Status -continue TF per family's request  Hypoglycemia -TF  Goals of care discussion  -Extubated 11/18 with no plan for reintubation. Continue current efforts for now, no escalation of care. PRN morphine for work of breathing / agitation. Will need to work toward removal of chest tube.  -11/20 mother, step father, wife, sister updated at bedside- planning to continue current care. His family is hopeful that he has ICU delirium with circadian dysfunction leading to his daytime sleepiness.  Best practice:  Diet: TF Pain/Anxiety/Delirium protocol (if indicated): as above VAP protocol (if indicated): yes DVT prophylaxis: SCD's   GI prophylaxis: Pantoprazole  Glucose control: monitor Mobility: BR Code Status: DNR. No reintubation. No escalation of care.  Family Communication:  Updated at bedside Disposition: ICU    Julian Hy, DO 10/07/20 11:25 AM [place] [facility]

## 2020-10-07 NOTE — Plan of Care (Signed)
  Problem: Clinical Measurements: Goal: Respiratory complications will improve Outcome: Progressing   Problem: Nutrition: Goal: Adequate nutrition will be maintained Outcome: Progressing   Problem: Nutrition: Goal: Adequate nutrition will be maintained Outcome: Progressing   Problem: Elimination: Goal: Will not experience complications related to bowel motility Outcome: Progressing   Problem: Safety: Goal: Ability to remain free from injury will improve Outcome: Progressing   Problem: Skin Integrity: Goal: Risk for impaired skin integrity will decrease Outcome: Progressing

## 2020-10-08 DIAGNOSIS — Z7189 Other specified counseling: Secondary | ICD-10-CM | POA: Diagnosis not present

## 2020-10-08 DIAGNOSIS — J9601 Acute respiratory failure with hypoxia: Secondary | ICD-10-CM

## 2020-10-08 DIAGNOSIS — R569 Unspecified convulsions: Secondary | ICD-10-CM

## 2020-10-08 DIAGNOSIS — Q8500 Neurofibromatosis, unspecified: Secondary | ICD-10-CM | POA: Diagnosis not present

## 2020-10-08 DIAGNOSIS — A419 Sepsis, unspecified organism: Secondary | ICD-10-CM | POA: Diagnosis not present

## 2020-10-08 DIAGNOSIS — J939 Pneumothorax, unspecified: Secondary | ICD-10-CM

## 2020-10-08 LAB — GLUCOSE, CAPILLARY
Glucose-Capillary: 112 mg/dL — ABNORMAL HIGH (ref 70–99)
Glucose-Capillary: 118 mg/dL — ABNORMAL HIGH (ref 70–99)
Glucose-Capillary: 120 mg/dL — ABNORMAL HIGH (ref 70–99)
Glucose-Capillary: 152 mg/dL — ABNORMAL HIGH (ref 70–99)
Glucose-Capillary: 89 mg/dL (ref 70–99)

## 2020-10-08 MED ORDER — INSULIN ASPART 100 UNIT/ML ~~LOC~~ SOLN
0.0000 [IU] | Freq: Two times a day (BID) | SUBCUTANEOUS | Status: DC
  Administered 2020-10-09 – 2020-10-10 (×3): 1 [IU] via SUBCUTANEOUS

## 2020-10-08 MED ORDER — SODIUM CHLORIDE 0.9 % IV BOLUS
1000.0000 mL | Freq: Once | INTRAVENOUS | Status: AC
  Administered 2020-10-08: 1000 mL via INTRAVENOUS

## 2020-10-08 NOTE — Progress Notes (Signed)
Manufacturing engineer [facility]) Community Based Palliative Care       This patient is enrolled in our palliative care services in the community.  If [facility] can support this patient or family through this tough situation in any way please let us know.  [facility] will continue to follow for any discharge planning needs and to coordinate continuation of palliative care.   If you have questions or need assistance, please call [phone] or contact the hospital Liaison listed on AMION.     Thank you for the opportunity to participate in this patient's care.     Domenic Moras, BSN, RN [facility] Liaison   [phone]

## 2020-10-08 NOTE — Progress Notes (Signed)
NAME:  Cameron Hale, [name]:  [id number], DOB:  August 09, 1976, LOS: 50 ADMISSION DATE:  10/08/2020, CONSULTATION DATE:  11/2 REFERRING MD:  Dyann Kief, CHIEF COMPLAINT:  Confusion   Brief History   44 y/o male with quadriplegia and recent intubation admitted from [facility] in the setting of respiratory failure likely from respiratory muscle weakness exacerbated by sepsis of a urinary origin.  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 G-tube (09/05/2020)  Significant Hospital Events   11/01 Admitted to [facility]   11/02 Transferred to [facility], on pressors. 11/03 Pressors off.  Growing K pneumoniae in blood and urine, antibiotics narrowed. EEG obtained. Neg for seizure. Triggered apnea alarm on vent. Fent gtt stopped. Not able to wean  11/04 more awake. On SBT volumes look good. Extubated, then re-intubated 11/05 family conversation: doesn't want tracheostomy, want to give him more time to try to get stronger 11/09 family meeting regarding long term goals  11/16 right pneumothorax, chest tube placed 11/18 Made DNR with one way extubation.   Consults:  PCCM  Procedures:  ETT 11/2 >> 11/4, 11/4 >  R Anterior Chest Tube 11/16 >>  Significant Diagnostic Tests:  11/1 CT Head >> extensive meningiomatosis noted unchanged. Bifrontal edema is stable. No acute abnormality.  11/3 EEG >> no seizure  Micro Data:  COVID 11/1 >> negative  Influenza 11/1 >> negative  BCx2 11/1 >> Klebsiella pneumoniae >> S-ceftazidime, ceftriaxone. R-ampicilling, cefazolin, bactrim, unasyn UC 11/1 >> Klebsiella pneumoniae >> S-cefaxolin, cefepime. I-nitrofurantion. R-ampicillin, bactrim Tracheal aspirate 11/5 >> normal flora    Antimicrobials:  Cefepime 11/1 >> 11/2 Metronidazole 11/2 >> 11/3 Vancomycin 11/2 >> 11/3 Azactam 11/2 >> 11/11   Interim  history/subjective:  Less alert today. Received IVF overnight for hypotension.  Objective   Blood pressure (!) 92/56, pulse 82, temperature (!) 97.4 F (36.3 C), temperature source Oral, resp. rate (!) 23, height 5\' 10"  (1.778 m), weight 65.3 kg, SpO2 97 %.        Intake/Output Summary (Last 24 hours) at 10/08/2020 0945 Last data filed at 10/08/2020 0715 Gross per 24 hour  Intake 3542.43 ml  Output 2800 ml  Net 742.43 ml   Filed Weights   10/04/20 0104 10/05/20 0500 10/07/20 0500  Weight: 66.5 kg 69.6 kg 65.3 kg    Examination:  General: Ill-appearing middle-aged man, lying in bed minimally responsive HEENT: [place]/AT, eyes anicteric. Neuro: Hardly arousable during exam, not tracking. CV: S1-S2, regular rate and rhythm PULM: Rhonchi diffusely bilaterally.  No upper airway secretions.  Shallow respirations, mild tachypnea GI: Soft, nontender, nondistended Extremities: No cyanosis or clubbing, edema resolved Skin: Pallor, no rashes    Resolved Hospital Problem list   Left lung collapse/possible mucus plug due to neuromuscular weakness, made worse in setting of sepsis  Septic shock Klebsiella pneumonia bacteremia -s/p Aztreonam for 11 days Right pneumothorax -pigtail chest tube removed 11/20  Assessment & Plan:   Acute respiratory failure with hypoxia- secondary to profound weakness related to NF II, failed extubation 11/4. Not clearing secretions adequately.  -Supplemental oxygen -Family is resistant to using medications unless he is in pain.  No current evidence of discomfort can suggest that he has air hunger. -Continue upper airway secretions management with suctioning. -I discussed my concerns with family that this is likely terminal does not seem to be improving as we had hoped.  They expressed understanding and reiterated that he would not  want repeat intubation.  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 Family feels that he is comfortable currently.  Seizures due to chronic neurofibromatosis Unilateral blindness and deafness -continue keppra  azotemia -Holding Lasix; IV fluids given overnight  Anemia S/p 2 units PRBC during admit  -intermittent CBC  Protein calorie malnutrition PEG Status -continue TF per family's request  Hypoglycemia-resolved -TF -Decrease Accu-Cheks to twice daily to promote optimal comfort since he is not requiring frequent intervention.  Goals of care discussion  -Extubated 11/18 with no plan for reintubation. Continue current efforts for now, no escalation of care. PRN morphine for work of breathing / agitation. Will need to work toward removal of chest tube.  -11/20 mother, step father, wife, sister updated at bedside- planning to continue current care. His family is hopeful that he has ICU delirium with circadian dysfunction leading to his daytime sleepiness. - I updated sister Joseph Art over the phone. His wife, mother, and step father were updated at bedside.  They confirmed that he would not want reintubation and understand that I am seeing signs of deterioration rather than improvement.  He is very unlikely to survive to leave the ICU.   Best practice:  Diet: TF Pain/Anxiety/Delirium protocol (if indicated): as above VAP protocol (if indicated): yes DVT prophylaxis: SCD's   GI prophylaxis: Pantoprazole  Glucose control: monitor Mobility: BR Code Status: DNR. No reintubation. No escalation of care.  Family Communication:  Updated at bedside as above Disposition: ICU    Julian Hy, DO 10/08/20 2:22 PM [place] [facility]

## 2020-10-08 NOTE — Plan of Care (Signed)
  Problem: Elimination: Goal: Will not experience complications related to bowel motility Outcome: Progressing Goal: Will not experience complications related to urinary retention Outcome: Progressing   Problem: Safety: Goal: Ability to remain free from injury will improve Outcome: Progressing   Problem: Skin Integrity: Goal: Risk for impaired skin integrity will decrease Outcome: Progressing   

## 2020-10-08 NOTE — Progress Notes (Signed)
Patient with worsening hypotension overnight. eLink notified of the same. 1L NS bolus ordered and started.

## 2020-10-08 NOTE — Progress Notes (Signed)
eLink Physician-Brief Progress Note Patient Name: Cameron Hale DOB: 02/12/1976 [name]: [id number]   Date of Service  10/08/2020  HPI/Events of Note  Hypotension - BP = 78/44 with MAP = 54. LVEF = 60-65%.  eICU Interventions  Plan: 1. Bolus with 0.9 NaCl 1 liter IV over 1 hour now.      Intervention Category Major Interventions: Hypotension - evaluation and management  Shaneque Merkle Cornelia Copa 10/08/2020, 5:43 AM

## 2020-10-09 DIAGNOSIS — A419 Sepsis, unspecified organism: Secondary | ICD-10-CM | POA: Diagnosis not present

## 2020-10-09 DIAGNOSIS — Z7189 Other specified counseling: Secondary | ICD-10-CM | POA: Diagnosis not present

## 2020-10-09 DIAGNOSIS — J9601 Acute respiratory failure with hypoxia: Secondary | ICD-10-CM | POA: Diagnosis not present

## 2020-10-09 LAB — GLUCOSE, CAPILLARY
Glucose-Capillary: 135 mg/dL — ABNORMAL HIGH (ref 70–99)
Glucose-Capillary: 119 mg/dL — ABNORMAL HIGH (ref 70–99)
Glucose-Capillary: 138 mg/dL — ABNORMAL HIGH (ref 70–99)

## 2020-10-09 MED ORDER — SODIUM CHLORIDE 0.9 % IV BOLUS
1000.0000 mL | Freq: Once | INTRAVENOUS | Status: AC
  Administered 2020-10-09: 1000 mL via INTRAVENOUS

## 2020-10-09 MED ORDER — ALBUMIN HUMAN 25 % IV SOLN
12.5000 g | Freq: Once | INTRAVENOUS | Status: AC
  Administered 2020-10-09: 12.5 g via INTRAVENOUS
  Filled 2020-10-09: qty 50

## 2020-10-09 NOTE — Progress Notes (Signed)
eLink Physician-Brief Progress Note Patient Name: Cameron Hale DOB: 06-21-1976 [name]: [id number]   Date of Service  10/09/2020  HPI/Events of Note  Hypotension - BP = 88/52 with MAP = 62. Last Albumin = 2.1. Scheduled Lasix dose held.   eICU Interventions  Plan: 1. 25% Albumin 12.5 gm IV now.     Intervention Category Major Interventions: Hypotension - evaluation and management  Demika Langenderfer [place] 10/09/2020, 12:30 AM

## 2020-10-09 NOTE — Progress Notes (Signed)
Patient's blood pressure continues to decrease throughout this shift.  Patient has received albumin and a bolus of normal saline which provided minimal improvement for a short period of time.  The patient's mother was called and given an update on her son's condition.  The mother was encouraged to come to the hospital now and she is on her way.

## 2020-10-09 NOTE — TOC Progression Note (Signed)
Transition of Care [facility]) - Progression Note    Patient Details  Name: Cameron Hale [name]: [id number] Date of Birth: 1975-12-08  Transition of Care [facility]) CM/SW Contact  Leeroy Cha, RN Phone Number: 10/09/2020, 9:05 AM  Clinical Narrative:    [place] [facility] Events   11/01 Admitted to [facility]   11/02 Transferred to [facility], on pressors. 11/03 Pressors off. Growing K pneumoniae in blood and urine, antibiotics narrowed. EEG obtained. Neg for seizure.Triggered apnea alarm on vent. Fent gtt stopped. Not able to wean  11/04 more awake. On SBT volumes look good. Extubated, then re-intubated 11/05 family conversation: doesn't want tracheostomy, want to give him more time to try to get stronger 11/09 family meeting regarding long term goals  11/16 right pneumothorax, chest tube placed 11/18 Made DNR with one way extubation.  Plan is to support family in patient transition to hospice care, bp 80/45 am of [id number]/ [facility] is [facility], iv decardon, tube feeds [place] at 3l/min,  Following for progression and toc needs.  Expected Discharge Plan: Home/Self Care Barriers to Discharge: Barriers Unresolved (comment) (medical treatment)  Expected Discharge Plan and Services Expected Discharge Plan: Home/Self Care   Discharge Planning Services: CM Consult   Living arrangements for the past 2 months: Single Family Home                                       Social Determinants of Health (SDOH) Interventions    Readmission Risk Interventions Readmission Risk Prevention Plan 09/08/2020 08/17/2020  Transportation Screening Complete Complete  Medication Review Press photographer) Complete Complete  PCP or Specialist appointment within 3-5 days of discharge Complete Complete  HRI or Home Care Consult Complete Complete  SW Recovery Care/Counseling Consult Complete Complete  Palliative Care Screening Complete Complete  [place] Not Applicable Complete  Some recent  data might be hidden

## 2020-10-09 NOTE — Progress Notes (Signed)
NAME:  Cameron Hale, [name]:  [id number], DOB:  1976-09-20, LOS: 21 ADMISSION DATE:  10/15/2020, CONSULTATION DATE:  11/2 REFERRING MD:  Dyann Kief, CHIEF COMPLAINT:  Confusion   Brief History   44 y/o male with quadriplegia and recent intubation admitted from [facility] in the setting of respiratory failure likely from respiratory muscle weakness exacerbated by sepsis of a urinary origin.  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 G-tube (09/05/2020)  Significant Hospital Events   11/01 Admitted to [facility]   11/02 Transferred to [facility], on pressors. 11/03 Pressors off.  Growing K pneumoniae in blood and urine, antibiotics narrowed. EEG obtained. Neg for seizure. Triggered apnea alarm on vent. Fent gtt stopped. Not able to wean  11/04 more awake. On SBT volumes look good. Extubated, then re-intubated 11/05 family conversation: doesn't want tracheostomy, want to give him more time to try to get stronger 11/09 family meeting regarding long term goals  11/16 right pneumothorax, chest tube placed 11/18 Made DNR with one way extubation.   Consults:  PCCM  Procedures:  ETT 11/2 >> 11/4, 11/4 >  R Anterior Chest Tube 11/16 >>  Significant Diagnostic Tests:  11/1 CT Head >> extensive meningiomatosis noted unchanged. Bifrontal edema is stable. No acute abnormality.  11/3 EEG >> no seizure  Micro Data:  COVID 11/1 >> negative  Influenza 11/1 >> negative  BCx2 11/1 >> Klebsiella pneumoniae >> S-ceftazidime, ceftriaxone. R-ampicilling, cefazolin, bactrim, unasyn UC 11/1 >> Klebsiella pneumoniae >> S-cefaxolin, cefepime. I-nitrofurantion. R-ampicillin, bactrim Tracheal aspirate 11/5 >> normal flora    Antimicrobials:  Cefepime 11/1 >> 11/2 Metronidazole 11/2 >> 11/3 Vancomycin 11/2 >> 11/3 Azactam 11/2 >> 11/11   Interim  history/subjective:  Has remained less responsive yesterday and overnight. Bolus of IVF and albumin overnight for hypotension. Now hypothermic.  Family called in this morning due to concern for mor rapid decline.  Objective   Blood pressure 102/63, pulse 70, temperature (!) 94.2 F (34.6 C), temperature source Axillary, resp. rate 19, height 5\' 10"  (1.778 m), weight 65.3 kg, SpO2 98 %.        Intake/Output Summary (Last 24 hours) at 10/09/2020 0847 Last data filed at 10/09/2020 0640 Gross per 24 hour  Intake 2677.63 ml  Output 1550 ml  Net 1127.63 ml   Filed Weights   10/04/20 0104 10/05/20 0500 10/07/20 0500  Weight: 66.5 kg 69.6 kg 65.3 kg    Examination:  General: ill appearing middle aged man laying in bed in NAD, appears stated age 44: [place]/AT, eyes anicteric Neuro: somnolent, not responding during exam CV: S1S2, regular rate and rhythm PULM: less rhonchi today, mild tachypnea, symmetric breath sounds. GI: soft, NT, ND. No erythema around PEG Extremities: no cyanosis or clubbing, no edema Skin: pallor, no rashes    Resolved Hospital Problem list   Left lung collapse/possible mucus plug due to neuromuscular weakness, made worse in setting of sepsis  Septic shock Klebsiella pneumonia bacteremia -s/p Aztreonam for 11 days Right pneumothorax -pigtail chest tube removed 11/20  Assessment & Plan:   Acute respiratory failure with hypoxia- secondary to profound weakness related to NF II, failed extubation 11/4. Not clearing secretions adequately.  -Supplemental oxygen PRN to maintain SpO2 >90% and to promote comfort. -Family is resistant to using medications unless he is in pain.  No current evidence of discomfort or to suggest that he has air hunger. -Continue upper airway secretions management  with suctioning. -Family is aware that he is terminal. I agree with his family being called in today as he is actively dying based in inability to thermoregulate and maintain his  BP.  Metabolic Encephalopathy Quadriplegia secondary to Neurofibromatosis II Tongue fasciculation unchanged from baseline -continue dexamethasone daily -Morphine as needed if in pain or distress. I agree with family that he is comfortable currently.  Seizures due to chronic neurofibromatosis Unilateral blindness and deafness -continue keppra  azotemia -Holding Lasix; IV fluids given overnight -con't enteral feeds and FW  Anemia, chronic S/p 2 units PRBC during admit  -intermittent CBC  Protein calorie malnutrition PEG Status -continue TF per family's request  Hypoglycemia-resolved -TF -Accu-Cheks to twice daily    Goals of care discussion  -Extubated 11/18 with no plan for reintubation. Continue current efforts for now, no escalation of care. PRN morphine for work of breathing / agitation. Will need to work toward removal of chest tube.  -11/20 mother, step father, wife, sister updated at bedside- planning to continue current care. His family is hopeful that he has ICU delirium with circadian dysfunction leading to his daytime sleepiness. - I updated sister Joseph Art over the phone. His wife, mother, and step father were updated at bedside.  They confirmed that he would not want reintubation and understand that I am seeing signs of deterioration rather than improvement.  He is very unlikely to survive to leave the ICU. -wife, mother, step father updated at bedside   Best practice:  Diet: TF Pain/Anxiety/Delirium protocol (if indicated): as above VAP protocol (if indicated): yes DVT prophylaxis: SCD's   GI prophylaxis: Pantoprazole  Glucose control: monitor Mobility: BR Code Status: DNR. No reintubation. No escalation of care.  Family Communication:  Updated at bedside as above Disposition: [place], DO 10/09/20 8:47 AM [place] [facility]

## 2020-10-09 NOTE — Progress Notes (Signed)
eLink Physician-Brief Progress Note Patient Name: Cameron Hale DOB: 1976/04/13 [name]: [id number]   Date of Service  10/09/2020  HPI/Events of Note  Hypotension - BP = 83/49 with MAP = 58. Patient is a DNR with instructions from the rounding physician not to escalate care. LVEF = 60-65%  eICU Interventions  Plan: 1. Bolus with 0.9 NaCl 1 liter IV over 1 hour now.       Intervention Category Major Interventions: Hypotension - evaluation and management  Terris Germano [place] 10/09/2020, 4:18 AM

## 2020-10-10 DIAGNOSIS — G8250 Quadriplegia, unspecified: Secondary | ICD-10-CM | POA: Diagnosis not present

## 2020-10-10 DIAGNOSIS — J9601 Acute respiratory failure with hypoxia: Secondary | ICD-10-CM | POA: Diagnosis not present

## 2020-10-10 DIAGNOSIS — A419 Sepsis, unspecified organism: Secondary | ICD-10-CM | POA: Diagnosis not present

## 2020-10-10 DIAGNOSIS — Q8500 Neurofibromatosis, unspecified: Secondary | ICD-10-CM | POA: Diagnosis not present

## 2020-10-10 LAB — GLUCOSE, CAPILLARY
Glucose-Capillary: 132 mg/dL — ABNORMAL HIGH (ref 70–99)
Glucose-Capillary: 129 mg/dL — ABNORMAL HIGH (ref 70–99)

## 2020-10-10 MED ORDER — CHLORHEXIDINE GLUCONATE 0.12 % MT SOLN
15.0000 mL | Freq: Two times a day (BID) | OROMUCOSAL | Status: DC
  Administered 2020-10-10: 15 mL via OROMUCOSAL
  Filled 2020-10-10: qty 15

## 2020-10-10 MED ORDER — SODIUM CHLORIDE 0.9 % IV BOLUS
1000.0000 mL | Freq: Once | INTRAVENOUS | Status: AC
  Administered 2020-10-10: 1000 mL via INTRAVENOUS

## 2020-10-10 MED ORDER — ORAL CARE MOUTH RINSE
15.0000 mL | Freq: Two times a day (BID) | OROMUCOSAL | Status: DC
  Administered 2020-10-10: 15 mL via OROMUCOSAL

## 2020-10-10 MED ORDER — OSMOLITE 1.2 CAL PO LIQD
1000.0000 mL | ORAL | Status: DC
  Administered 2020-10-10: 1000 mL

## 2020-10-10 NOTE — Progress Notes (Signed)
Nutrition Follow-up  DOCUMENTATION CODES:   Not applicable  INTERVENTION:  - will adjust TF regimen to meet re-estimated needs. - Osmolite 1.2 @ 45 ml/hr to advance by 10 ml every 4 hours to reach goal rate of 65 ml/hr with 45 ml Prosource TF BID, 1 packet juven BID, and 100 ml free water QID. - this regimen will provide 2142 kcal, 113 grams protein, and 1679 ml free water.    NUTRITION DIAGNOSIS:   Inadequate oral intake related to inability to eat as evidenced by NPO status. -ongoing  GOAL:   Patient will meet greater than or equal to 90% of their needs -to be met with TF regimen  MONITOR:   TF tolerance, Labs, Weight trends, Skin  ASSESSMENT:   44 y.o. male with medical history of impaired hearing, neurofibromatosis 2, neurogenic bladder, pressure ulcer, quadriplegia, seizures, vestibular schwannoma, and cerebral thrombosis with cerebral infarction. He was intubated 9/21-10/22 at [facility]. He presented to the ED due to worsening mental status since 10/29. EMS reported to ED staff that patient was unresponsive to sternal rub initially.  Patient discussed in rounds this AM.  Patient was extubated on 11/18 and is currently on [place]. He has PEG in place and is receiving Vital AF 1.2 @ 50 ml/hr with 45 ml Prosource TF BID, 1 packet juven BID, and 100 ml free water QID. This regimen is providing 1710 kcal, 117 grams protein, and 1373 ml free water.   Weight has been fairly stable since 11/17. Yesterday, edema flow sheet documentation indicated that patient has no edema present.    Labs reviewed; CBG: 132 mg/dl. Medications reviewed; 250 mg ascorbic acid/day, 300 mg ferrous sulfate/day, sliding scale novolog, 40 mg protonix BID.   Diet Order:   Diet Order            Diet NPO time specified  Diet effective now                 EDUCATION NEEDS:   No education needs have been identified at this time  Skin:  Skin Assessment: Skin Integrity Issues: Skin Integrity Issues::  Stage I, Stage II, Stage III, Other (Comment) Stage I: R elbow; R heel Stage II: L thigh Stage III: sacrum Other: MASD to scrotum  Last BM:  11/23 (type 6)  Height:   Ht Readings from Last 1 Encounters:  09/23/20 '5\' 10"'  (1.778 m)    Weight:   Wt Readings from Last 1 Encounters:  10/10/20 67 kg     Estimated Nutritional Needs:  Kcal:  2010-2250 kcal Protein:  100-115 grams Fluid:  >/= 2 L/day      Jarome Matin, MS, RD, LDN, CNSC Inpatient Clinical Dietitian RD pager # available in [place]  After hours/weekend pager # available in [facility]

## 2020-10-10 NOTE — Progress Notes (Signed)
eLink Physician-Brief Progress Note Patient Name: Cameron Hale DOB: [birth year] [name]: [id number]   Date of Service  10/10/2020  HPI/Events of Note  Hypotension - BP = 93/48 with MAP = 58. Patient is DNR/No escalation of care.   eICU Interventions  Plan: 1. Bolus with 0.9 NaCl 1 liter IV over 1 hour now.      Intervention Category Major Interventions: Hypotension - evaluation and management  Kaliel Bolds [place] 10/10/2020, 4:59 AM

## 2020-10-10 NOTE — Progress Notes (Signed)
eLink Physician-Brief Progress Note Patient Name: PACEY ALTIZER DOB: 23-Jan-1976 [name]: [id number]   Date of Service  10/10/2020  HPI/Events of Note  Asked to speak with patient's mother who is at bedside. Discussed with her in detail. She is 16, her husband is 2, they are appropriately nervous about the inevitable situation and understand his condition. She had a few questions which I answered and at the end of our discussion, she was thankful for the care he has received and for the call. She said they were nervous to leave his bedside because they wanted to be there when he passes.   eICU Interventions  Offered emotional support and told her I would be available for questions if needed.      Intervention Category Intermediate Interventions: Communication with other healthcare providers and/or family  Margaretmary Lombard 10/10/2020, 8:57 PM

## 2020-10-10 NOTE — Progress Notes (Signed)
NAME:  Cameron Hale, [name]:  [id number], DOB:  December 15, 1975, LOS: 71 ADMISSION DATE:  10/03/2020, CONSULTATION DATE:  11/2 REFERRING MD:  Dyann Kief, CHIEF COMPLAINT:  Confusion   Brief History   44 y/o male with quadriplegia and recent intubation admitted from [facility] in the setting of respiratory failure likely from respiratory muscle weakness exacerbated by sepsis of a urinary origin.  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 G-tube (09/05/2020)  Significant Hospital Events   11/01 Admitted to [facility]   11/02 Transferred to [facility], on pressors. 11/03 Pressors off.  Growing K pneumoniae in blood and urine, antibiotics narrowed. EEG obtained. Neg for seizure. Triggered apnea alarm on vent. Fent gtt stopped. Not able to wean  11/04 more awake. On SBT volumes look good. Extubated, then re-intubated 11/05 family conversation: doesn't want tracheostomy, want to give him more time to try to get stronger 11/09 family meeting regarding long term goals  11/16 right pneumothorax, chest tube placed 11/18 Made DNR with one way extubation.   Consults:  PCCM  Procedures:  ETT 11/2 >> 11/4, 11/4 >  R Anterior Chest Tube 11/16 >>  Significant Diagnostic Tests:  11/1 CT Head >> extensive meningiomatosis noted unchanged. Bifrontal edema is stable. No acute abnormality.  11/3 EEG >> no seizure  Micro Data:  COVID 11/1 >> negative  Influenza 11/1 >> negative  BCx2 11/1 >> Klebsiella pneumoniae >> S-ceftazidime, ceftriaxone. R-ampicilling, cefazolin, bactrim, unasyn UC 11/1 >> Klebsiella pneumoniae >> S-cefaxolin, cefepime. I-nitrofurantion. R-ampicillin, bactrim Tracheal aspirate 11/5 >> normal flora    Antimicrobials:  Cefepime 11/1 >> 11/2 Metronidazole 11/2 >> 11/3 Vancomycin 11/2 >> 11/3 Azactam 11/2 >> 11/11   Interim  history/subjective:  Received IVF bolus overnight for hypotension. His mother and step-father are at bedside today.  Objective   Blood pressure (!) 81/50, pulse (!) 112, temperature (!) 93.9 F (34.4 C), temperature source Axillary, resp. rate (!) 28, height 5\' 10"  (1.778 m), weight 67 kg, SpO2 100 %.        Intake/Output Summary (Last 24 hours) at 10/10/2020 0733 Last data filed at 10/10/2020 0641 Gross per 24 hour  Intake 870.94 ml  Output 1100 ml  Net -229.06 ml   Filed Weights   10/05/20 0500 10/07/20 0500 10/10/20 0500  Weight: 69.6 kg 65.3 kg 67 kg    Examination:  General: chronically ill appearing middle aged man laying in bed in NAD HEENT: [place]/AT, eyes anicteric. Mild conjunctival injection in the lower eyes. Neuro: somnolent, not responding during exam somnolent, not responsive to verbal stimulation or during his exam.  Cardiac: tachycardic, reg rhythm PULM:  Tachypnea, minimal breath sounds bilaterally GI: soft, NT, ND Extremities: no c/c/e Skin: pallor, no rashes   Resolved Hospital Problem list   Left lung collapse/possible mucus plug due to neuromuscular weakness, made worse in setting of sepsis  Septic shock Klebsiella pneumonia bacteremia -s/p Aztreonam for 11 days Right pneumothorax -pigtail chest tube removed 11/20  Assessment & Plan:   Acute respiratory failure with hypoxia- secondary to profound weakness related to NF II, failed extubation 11/4. Not clearing secretions adequately.  -Supplemental O2 PRN. -Family is resistant to using medications unless he is in pain.  No current evidence of discomfort or to suggest that he has air hunger. Morphine PRN if he develops signs of discomfort. -Continue upper airway secretions management with suctioning. -Family is aware that he is terminal.  Metabolic Encephalopathy Quadriplegia secondary to Neurofibromatosis II Tongue fasciculation unchanged from baseline -continue dexamethasone daily -Morphine as  needed if in pain or distress. I am worried he may develop respiratory distress at some point, which can be treated with PRN morphine.  Seizures due to chronic neurofibromatosis Unilateral blindness and deafness -continue keppra  azotemia -Holding Lasix; IV fluids given overnight -con't enteral feeds and FW  Anemia, chronic S/p 2 units PRBC during admit  -intermittent CBC  Protein calorie malnutrition PEG Status -continue TF per family's request  Hypoglycemia-resolved -TF -Accu-Cheks to twice daily    Goals of care discussion  -Extubated 11/18 with no plan for reintubation. Continue current efforts for now, no escalation of care. PRN morphine for work of breathing / agitation. Will need to work toward removal of chest tube.  -11/20 mother, step father, wife, sister updated at bedside- planning to continue current care. His family is hopeful that he has ICU delirium with circadian dysfunction leading to his daytime sleepiness. - I updated sister Joseph Art over the phone. His wife, mother, and step father were updated at bedside.  They confirmed that he would not want reintubation and understand that I am seeing signs of deterioration rather than improvement.  He is very unlikely to survive to leave the ICU. -wife, mother, step father updated at bedside on 11/22 -11/23 mother, step-father updated at bedside  Best practice:  Diet: TF Pain/Anxiety/Delirium protocol (if indicated): as above VAP protocol (if indicated): yes DVT prophylaxis: SCD's   GI prophylaxis: Pantoprazole  Glucose control: monitor Mobility: BR Code Status: DNR. No reintubation. No escalation of care.  Family Communication:  Updated at bedside as above Disposition: [place] Sadie Pickar, DO 10/10/20 1:56 PM [facility]

## 2020-10-18 NOTE — Death Summary Note (Signed)
Physician Discharge Summary  Patient ID: Cameron Hale [name]: [id number] DOB/AGE: Oct 03, 1976 44 y.o.  Admit date: 09/22/2020 Discharge date: 08-Nov-2020  Admission Diagnoses: Septic shock  Community acquired pneumonia Acute on chronic hypoxic respiratory failure Acute encephalopathy Hypothermia Hyponatremia Acute on chronic anemia Severe protein energy malnutrition Quadriplegia Neurofibromatosis type II History of seizures Acute GI bleed   Discharge Diagnoses:  Principal Problem:   Sepsis due to undetermined organism [facility]) Active Problems:   Neurofibroma of multiple sites ([place])   Quadriplegia ([place])   Hyponatremia   Seizures ([place])   AMS (altered mental status)   GI bleed   Symptomatic anemia   Severe protein-calorie malnutrition (HCC)   Sepsis with acute organ dysfunction without septic shock (HCC)   Acute respiratory failure (HCC)   Septic shock (HCC)   Pressure injury of skin   Urinary tract infection without hematuria   Pneumothorax on right Acute on chronic respiratory failure UTI due to klebsiella pneumoniae Bacteremia due to Klebsiella pneimoniae Azotemia Acute metabolic encephalopathy Unilateral blindness and deafness Hypoglycemia Spontaneous pneumothorax on the right  Discharged Condition: deceased  Hospital Course: [name] was admitted to the hospital with septic shock and pneumonia. Due to severe encephalopathy he required intubation for respiratory failure. Although his septic shock improved and he no longer required vasopressors, his mental status failed to significantly improve.  He improved enough to tolerate extubation, but required reintubation for respiratory failure the same day.  He has chronic respiratory muscle weakness due to severe neurological impairment from chronic neurofibromatosis and previous surgical complications in his C-spine. He had an admission in October 2021 with marked apneas and was difficult to wean from MV during that  admission. He required ongoing MV for severe encephalopathy and apneas which failed to improve. He developed a spontaneous pneumothorax while on mechanical ventilation requiring chest tube placement. When he failed to improve his family elected for terminal extubation and focusing on comfort. At their request his TF were continued.  He passed away at 9:05 AM with family at bedside.   Consults: ID, PCCM, Palliative Care, wound care  Significant Diagnostic Studies:  Blood cultures> NG Respiratory cultures 11/5>  Normal flora Respiratory culture 11/2>  Normal flora Urine culture 11/1> K. pneumonaie Blood culture 11/1> K. pneumonaie  11/1 CT Head >> extensive meningiomatosis noted unchanged. Bifrontal edema is stable. No acute abnormality. 11/3 EEG >> no seizure  Treatments:  Mechanical ventilation Vasopressors Antibiotics Enteral nutrition Pain medication IV fluids   Signed: Julian Hy 2020/11/08, 9:27 AM

## 2020-10-18 NOTE — TOC Progression Note (Signed)
Transition of Care [facility]) - Progression Note    Patient Details  Name: Cameron Hale [name]: [id number] Date of Birth: [birth year]  Transition of Care [facility]) CM/SW Contact  Leeroy Cha, RN Phone Number: 10-27-2020, 8:51 AM  Clinical Narrative:    [place] Hospital Events   11/01 Admitted to [facility]   11/02 Transferred to [facility], on pressors. 11/03 Pressors off. Growing K pneumoniae in blood and urine, antibiotics narrowed. EEG obtained. Neg for seizure.Triggered apnea alarm on vent. Fent gtt stopped. Not able to wean  11/04 more awake. On SBT volumes look good. Extubated, then re-intubated 11/05 family conversation: doesn't want tracheostomy, want to give him more time to try to get stronger 11/09 family meeting regarding long term goals  11/16 right pneumothorax, chest tube placed 11/18 Made DNR with one way extubation.   On o2at 3l/min, Following for progression   Expected Discharge Plan: Home/Self Care Barriers to Discharge: Barriers Unresolved (comment) (medical treatment)  Expected Discharge Plan and Services Expected Discharge Plan: Home/Self Care   Discharge Planning Services: CM Consult   Living arrangements for the past 2 months: Single Family Home                                       Social Determinants of Health (SDOH) Interventions    Readmission Risk Interventions Readmission Risk Prevention Plan 09/08/2020 08/17/2020  Transportation Screening Complete Complete  Medication Review [employer]) Complete Complete  PCP or Specialist appointment within 3-5 days of discharge Complete Complete  HRI or Home Care Consult Complete Complete  SW Recovery Care/Counseling Consult Complete Complete  Palliative Care Screening Complete Complete  [place] Not Applicable Complete  Some recent data might be hidden

## 2020-10-18 NOTE — Progress Notes (Signed)
Patient asystole, verified by Milinda Hirschfeld, RN and Jerene Pitch, RN. MD made aware. Family at bedside. Emotional support provided. No needs expressed from the family at this time.

## 2020-10-18 DEATH — deceased

## 2020-10-19 ENCOUNTER — Ambulatory Visit: Payer: Medicare Other | Admitting: Internal Medicine

## 2020-10-25 ENCOUNTER — Ambulatory Visit: Payer: Medicare Other | Admitting: Urology

## 2022-03-28 IMAGING — DX DG CHEST 1V
1 series · 1 of 1 positions shown · non-contrast
Comparison: 08/11/2020

CLINICAL DATA: Acute respiratory failure.

EXAM:
CHEST  1 VIEW

[chest ap]
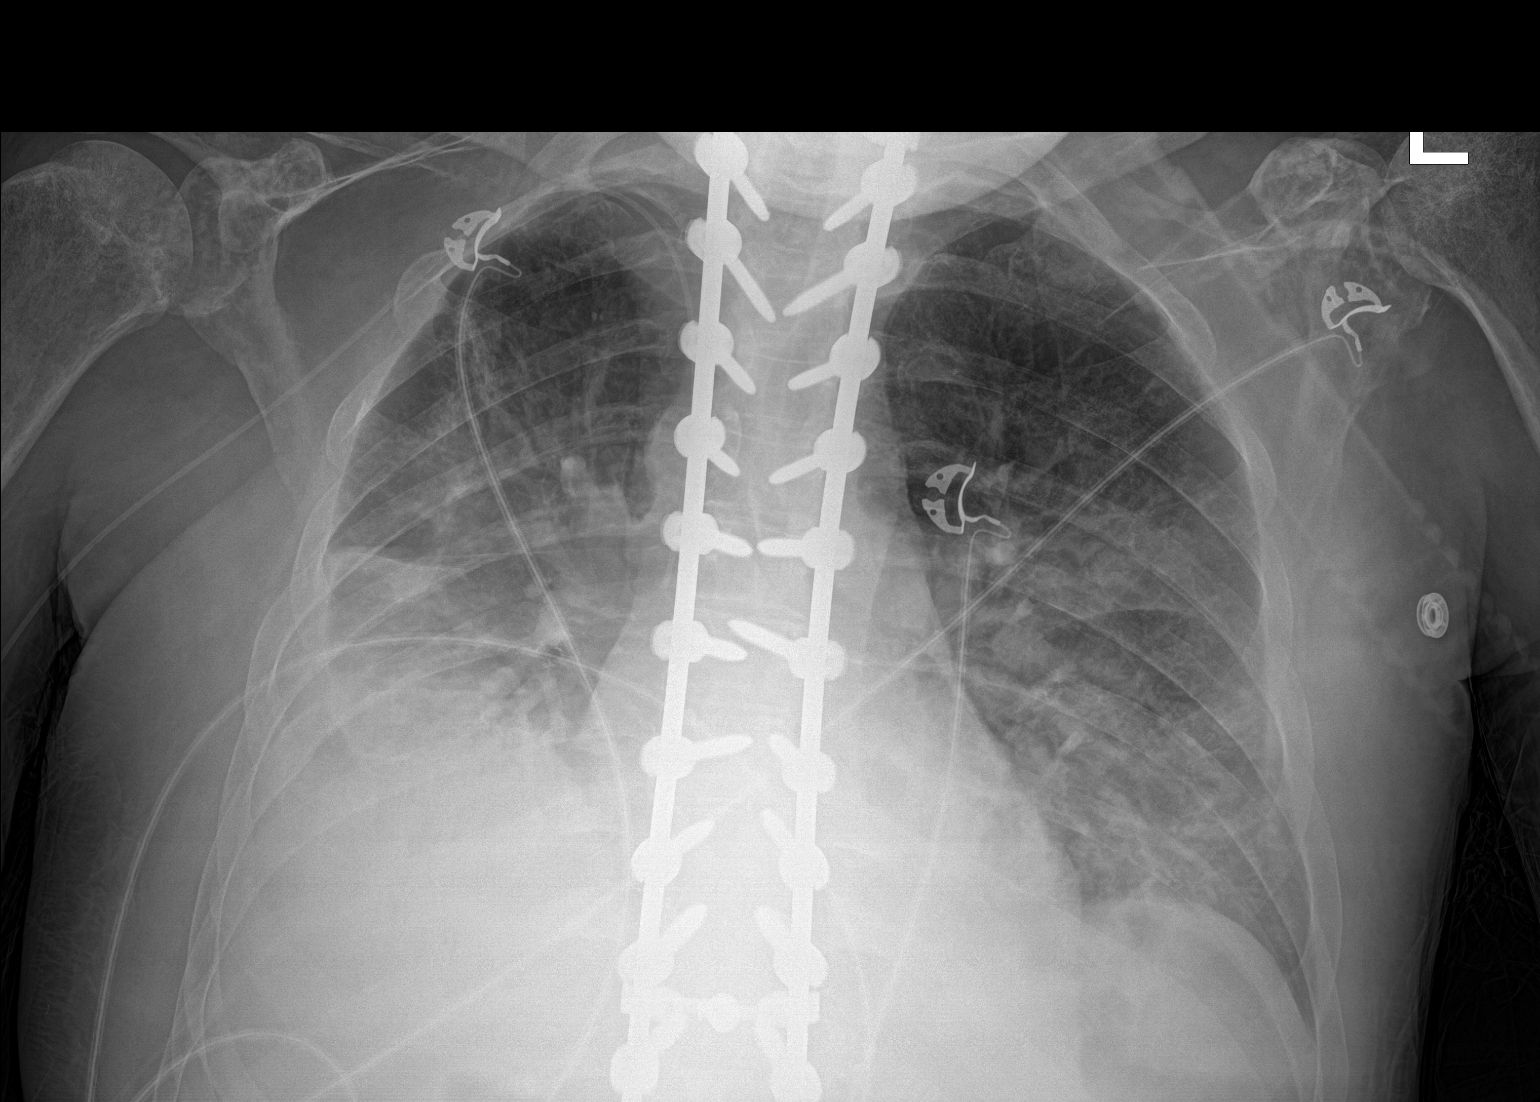

[1 of 1 positions shown; findings below may reference images not displayed]

FINDINGS: Heart size remains normal. Right arm PICC line remains in
appropriate position. Posterior spinal fusion hardware again seen.

Moderate symmetric bilateral airspace disease shows no significant
change. Moderate right and small left pleural effusions are again
seen, without significant change allowing for differences in
semi-erect positioning on current exam. Increased atelectasis or
infiltrate noted in right lung base. No pneumothorax visualized.
IMPRESSION: Increased right basilar atelectasis versus infiltrate.

Otherwise stable symmetric bilateral airspace disease, and right
greater than left pleural effusions.

## 2022-03-29 IMAGING — MR MR HEAD W/O CM
10 of 11 series · 43 of 48 positions shown · non-contrast
Comparison: 08/13/2020 head CT and prior. 05/24/2020 MRI head and
prior.

CLINICAL DATA: Anoxic brain injury.

EXAM:
MRI HEAD WITHOUT CONTRAST
TECHNIQUE: Multiplanar, multiecho pulse sequences of the brain and surrounding
structures were obtained without intravenous contrast.

[Series 5: DWI · axial · 3.0mm · 0.88mm/px · z∈[-45,+104]mm · 9 of 102 slices shown (1 of 4)]
[im 1/102]
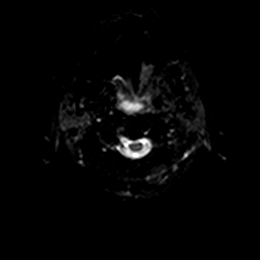
[im 13/102]
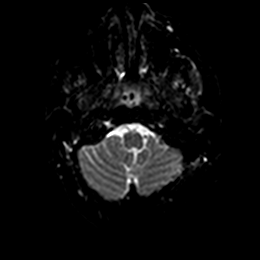
[im 26/102]
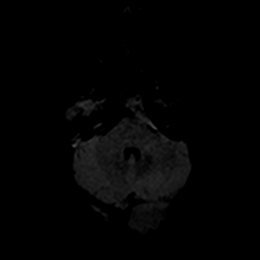
[im 38/102]
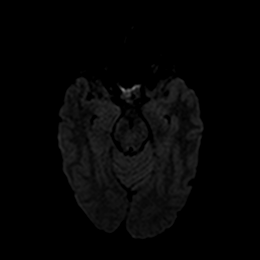
[im 51/102]
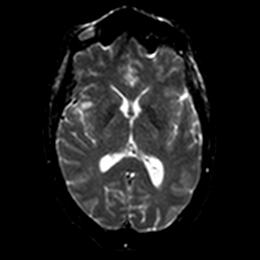
[im 64/102]
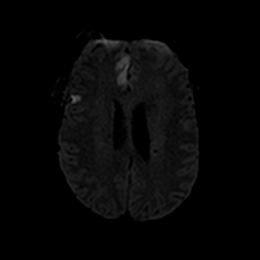
[im 76/102]
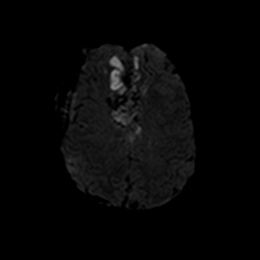
[im 89/102]
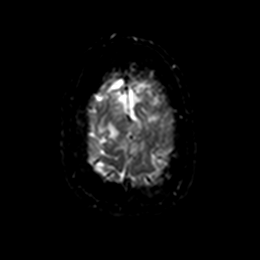
[im 102/102]
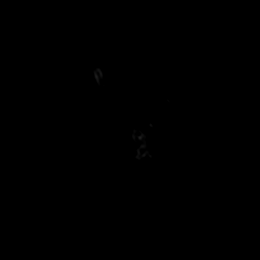

[Series 6: DWI · axial · 3.0mm · 0.88mm/px · z∈[-45,+104]mm · 4 of 51 slices shown (2 of 4)]
[im 1/51]
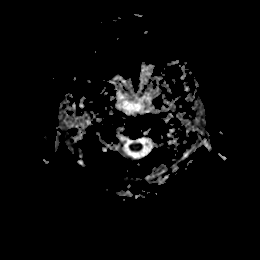
[im 17/51]
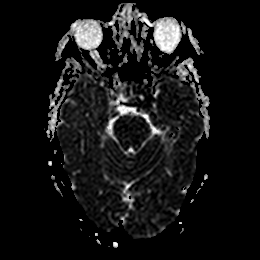
[im 34/51]
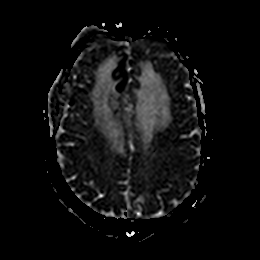
[im 51/51]
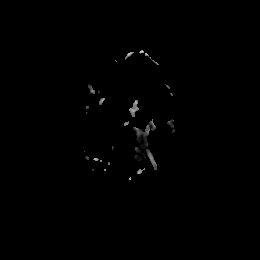

[Series 7: DWI · coronal · 4.0mm · 0.88mm/px · 7 of 79 slices shown (3 of 4)]
[im 1/79]
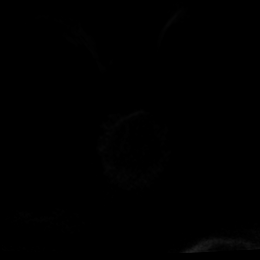
[im 14/79]
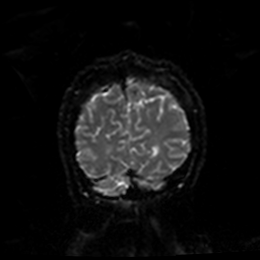
[im 27/79]
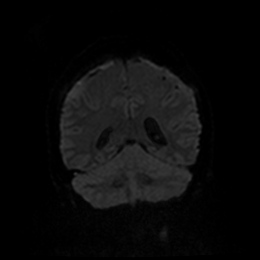
[im 40/79]
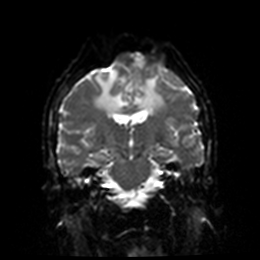
[im 53/79]
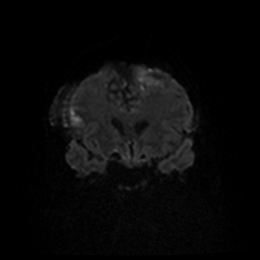
[im 66/79]
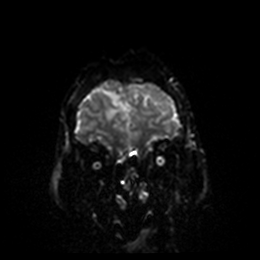
[im 79/79]
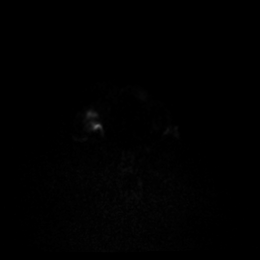

[Series 8: DWI · coronal · 4.0mm · 0.88mm/px · 3 of 39 slices shown (4 of 4)]
[im 1/39]
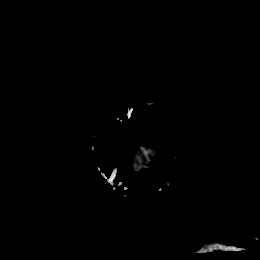
[im 20/39]
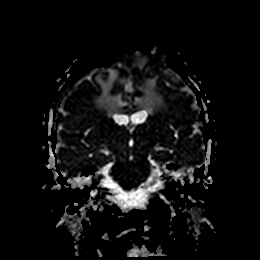
[im 39/39]
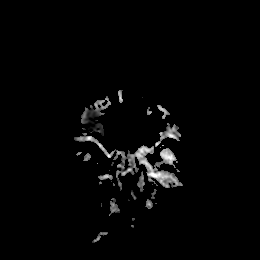

[Series 9: T1 · sagittal · 5.0mm · 0.94mm/px · 2 of 23 slices shown]
[im 1/23]
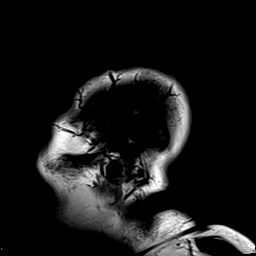
[im 23/23]
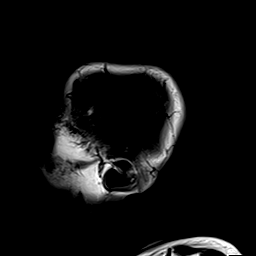

[Series 10: T2 · axial · 5.0mm · 0.90mm/px · z∈[-42,+101]mm · 2 of 25 slices shown]
[im 1/25]
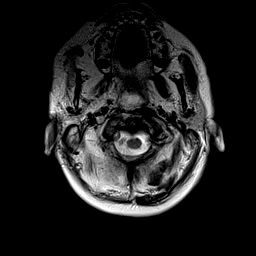
[im 25/25]
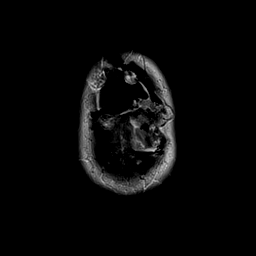

[Series 11: FLAIR · axial · 5.0mm · 0.45mm/px · z∈[-43,+101]mm · 2 of 25 slices shown]
[im 1/25]
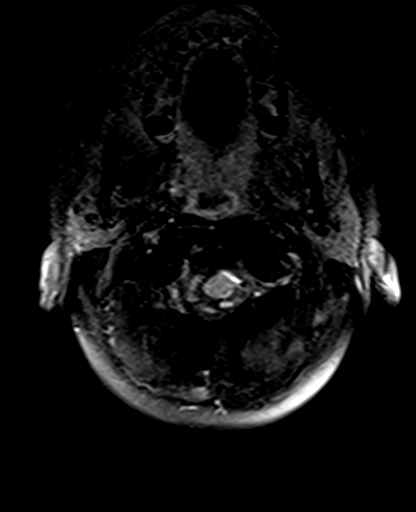
[im 25/25]
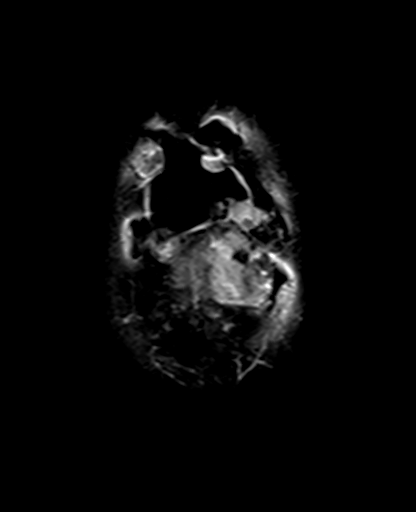

[Series 12: mag_images · axial · 3.0mm · 0.90mm/px · z∈[-51,+125]mm · 5 of 60 slices shown]
[im 1/60]
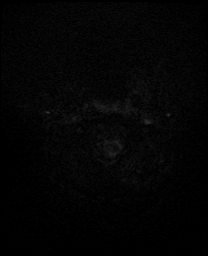
[im 15/60]
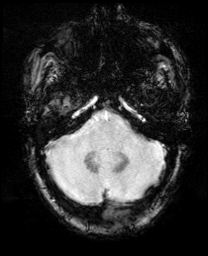
[im 30/60]
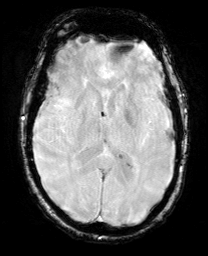
[im 45/60]
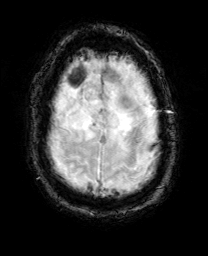
[im 60/60]
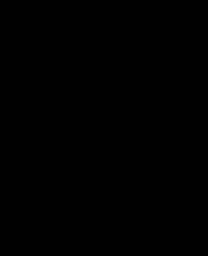

[Series 14: swi_images · axial · 3.0mm · 0.90mm/px · z∈[-51,+125]mm · 5 of 60 slices shown]
[im 1/60]
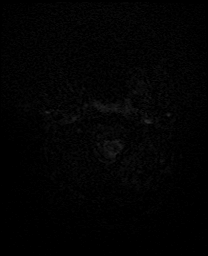
[im 15/60]
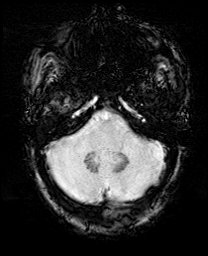
[im 30/60]
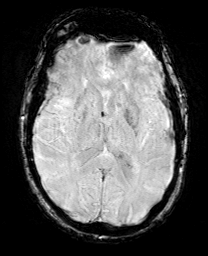
[im 45/60]
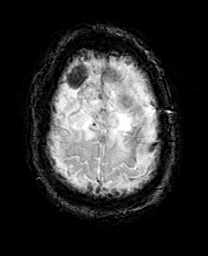
[im 60/60]
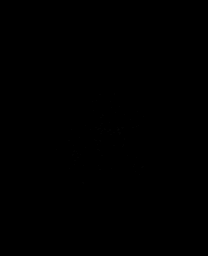

[Series 15: mip_images(sw) · axial · 24.0mm · 0.90mm/px · z∈[-41,+114]mm · 4 of 53 slices shown]
[im 1/53]
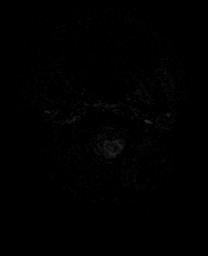
[im 18/53]
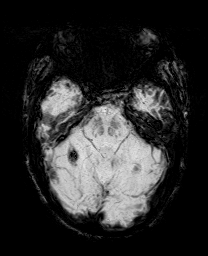
[im 35/53]
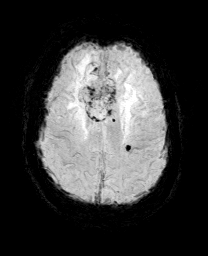
[im 53/53]
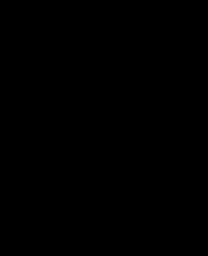

[43 of 48 positions shown; findings below may reference images not displayed]

FINDINGS: Lack of intravenous contrast limits evaluation.

Brain: New cortically based restricted diffusion involving the
bilateral frontal lobes most prominent medially. Small focal
restricted diffusion along the superior vermis ([DATE]). Left frontal
white matter microhemorrhages are unchanged.

Redemonstration of extensive meningiomatosis with with additional
meningiomas overlying the bilateral cerebral convexities and
infratentorially. Dominant parafalcine meningioma with intralesional
SWI signal dropout is grossly unchanged in size. Bifrontal
perilesional edema is also unchanged.

No midline shift, ventriculomegaly or extra-axial fluid collection.
No definite new or enlarging mass lesions.

Vascular: Major intracranial flow voids are patent proximally.

Skull and upper cervical spine: Heterogeneity of the bone marrow
signal most prominent at the vertex is unchanged.

Sinuses/Orbits: Normal orbits. Sequela of chronic right sphenoid and
left frontal allergic fungal sinusitis. Bilateral mastoid effusions.

Other: None.
IMPRESSION: Cortically based acute infarcts involving the bilateral frontal
lobes most prominent in the ACA territory.

Small superior vermian acute infarct.

Chronic sequela of NF 2, grossly unchanged. No new or enlarging
intracranial lesions however lack of intravenous contrast limits
evaluation.

Dominant parafalcine meningioma and bifrontal edema, grossly
unchanged in size.

These results were called by telephone at the time of interpretation
on 08/13/2020 at [DATE] to provider ERICANO APHANE , who verbally
acknowledged these results.
